# Patient Record
Sex: Male | Born: 1940
Health system: Southern US, Community
[De-identification: ages and names within clinical notes are randomized; demographics above are authoritative.]

## PROBLEM LIST (undated history)

## (undated) DIAGNOSIS — E119 Type 2 diabetes mellitus without complications: Secondary | ICD-10-CM

## (undated) DIAGNOSIS — R569 Unspecified convulsions: Secondary | ICD-10-CM

## (undated) DIAGNOSIS — I483 Typical atrial flutter: Secondary | ICD-10-CM

## (undated) DIAGNOSIS — K449 Diaphragmatic hernia without obstruction or gangrene: Secondary | ICD-10-CM

## (undated) DIAGNOSIS — K859 Acute pancreatitis without necrosis or infection, unspecified: Secondary | ICD-10-CM

## (undated) DIAGNOSIS — I10 Essential (primary) hypertension: Secondary | ICD-10-CM

## (undated) DIAGNOSIS — I639 Cerebral infarction, unspecified: Secondary | ICD-10-CM

## (undated) HISTORY — DX: Diaphragmatic hernia without obstruction or gangrene: K44.9

## (undated) HISTORY — DX: Typical atrial flutter: I48.3

## (undated) HISTORY — DX: Essential (primary) hypertension: I10

## (undated) HISTORY — DX: Acute pancreatitis without necrosis or infection, unspecified: K85.90

---

## 1999-12-08 ENCOUNTER — Emergency Department (HOSPITAL_COMMUNITY): Admission: EM | Admit: 1999-12-08 | Discharge: 1999-12-08 | Payer: Self-pay | Admitting: Emergency Medicine

## 2000-02-15 ENCOUNTER — Encounter: Payer: Self-pay | Admitting: Emergency Medicine

## 2000-02-15 ENCOUNTER — Emergency Department (HOSPITAL_COMMUNITY): Admission: EM | Admit: 2000-02-15 | Discharge: 2000-02-15 | Payer: Self-pay | Admitting: Emergency Medicine

## 2000-08-11 ENCOUNTER — Encounter: Payer: Self-pay | Admitting: Emergency Medicine

## 2000-08-11 ENCOUNTER — Emergency Department (HOSPITAL_COMMUNITY): Admission: EM | Admit: 2000-08-11 | Discharge: 2000-08-11 | Payer: Self-pay | Admitting: Emergency Medicine

## 2000-09-09 ENCOUNTER — Encounter: Admission: RE | Admit: 2000-09-09 | Discharge: 2000-09-09 | Payer: Self-pay | Admitting: Neurology

## 2000-09-09 ENCOUNTER — Encounter: Payer: Self-pay | Admitting: Neurology

## 2004-04-21 ENCOUNTER — Ambulatory Visit: Payer: Self-pay | Admitting: Internal Medicine

## 2004-11-11 ENCOUNTER — Emergency Department (HOSPITAL_COMMUNITY): Admission: EM | Admit: 2004-11-11 | Discharge: 2004-11-11 | Payer: Self-pay | Admitting: Emergency Medicine

## 2004-11-23 ENCOUNTER — Emergency Department (HOSPITAL_COMMUNITY): Admission: EM | Admit: 2004-11-23 | Discharge: 2004-11-23 | Payer: Self-pay | Admitting: Emergency Medicine

## 2004-11-24 ENCOUNTER — Inpatient Hospital Stay (HOSPITAL_COMMUNITY): Admission: EM | Admit: 2004-11-24 | Discharge: 2004-11-30 | Payer: Self-pay | Admitting: Emergency Medicine

## 2004-11-24 ENCOUNTER — Inpatient Hospital Stay (HOSPITAL_COMMUNITY): Admission: RE | Admit: 2004-11-24 | Discharge: 2004-11-24 | Payer: Self-pay | Admitting: Psychiatry

## 2005-02-04 ENCOUNTER — Encounter: Admission: RE | Admit: 2005-02-04 | Discharge: 2005-05-05 | Payer: Self-pay | Admitting: *Deleted

## 2005-02-05 ENCOUNTER — Ambulatory Visit: Payer: Self-pay | Admitting: Internal Medicine

## 2005-02-16 ENCOUNTER — Ambulatory Visit: Payer: Self-pay | Admitting: Internal Medicine

## 2005-03-25 ENCOUNTER — Ambulatory Visit: Payer: Self-pay | Admitting: Internal Medicine

## 2005-05-27 ENCOUNTER — Ambulatory Visit: Payer: Self-pay | Admitting: Internal Medicine

## 2005-06-06 ENCOUNTER — Inpatient Hospital Stay (HOSPITAL_COMMUNITY): Admission: EM | Admit: 2005-06-06 | Discharge: 2005-06-09 | Payer: Self-pay | Admitting: Emergency Medicine

## 2005-06-06 ENCOUNTER — Ambulatory Visit: Payer: Self-pay | Admitting: Internal Medicine

## 2005-06-06 ENCOUNTER — Ambulatory Visit: Payer: Self-pay | Admitting: Physical Medicine & Rehabilitation

## 2005-06-17 ENCOUNTER — Ambulatory Visit: Payer: Self-pay | Admitting: Internal Medicine

## 2005-06-18 ENCOUNTER — Ambulatory Visit: Payer: Self-pay | Admitting: Internal Medicine

## 2005-07-03 ENCOUNTER — Emergency Department (HOSPITAL_COMMUNITY): Admission: EM | Admit: 2005-07-03 | Discharge: 2005-07-03 | Payer: Self-pay | Admitting: Emergency Medicine

## 2005-07-03 ENCOUNTER — Inpatient Hospital Stay (HOSPITAL_COMMUNITY): Admission: EM | Admit: 2005-07-03 | Discharge: 2005-07-08 | Payer: Self-pay | Admitting: Internal Medicine

## 2005-07-08 ENCOUNTER — Ambulatory Visit: Payer: Self-pay | Admitting: Internal Medicine

## 2005-07-15 ENCOUNTER — Ambulatory Visit: Payer: Self-pay | Admitting: Internal Medicine

## 2005-09-14 ENCOUNTER — Ambulatory Visit: Payer: Self-pay | Admitting: Internal Medicine

## 2005-10-07 ENCOUNTER — Ambulatory Visit: Payer: Self-pay | Admitting: Internal Medicine

## 2005-10-08 ENCOUNTER — Encounter: Admission: RE | Admit: 2005-10-08 | Discharge: 2005-10-08 | Payer: Self-pay | Admitting: Internal Medicine

## 2005-10-14 ENCOUNTER — Encounter: Admission: RE | Admit: 2005-10-14 | Discharge: 2005-10-14 | Payer: Self-pay | Admitting: Internal Medicine

## 2005-10-21 ENCOUNTER — Ambulatory Visit: Payer: Self-pay | Admitting: Internal Medicine

## 2005-12-21 ENCOUNTER — Ambulatory Visit: Payer: Self-pay | Admitting: Internal Medicine

## 2006-02-28 ENCOUNTER — Ambulatory Visit: Payer: Self-pay | Admitting: Internal Medicine

## 2006-03-01 LAB — CONVERTED CEMR LAB
ALT: 15 units/L (ref 0–40)
AST: 25 units/L (ref 0–37)
Albumin: 4 g/dL (ref 3.5–5.2)
Amylase: 271 units/L — ABNORMAL HIGH (ref 27–131)
Basophils Absolute: 0.1 10*3/uL (ref 0.0–0.1)
HCT: 37.8 % — ABNORMAL LOW (ref 39.0–52.0)
MCHC: 33.3 g/dL (ref 30.0–36.0)
Neutrophils Relative %: 44.2 % (ref 43.0–77.0)
Platelets: 223 10*3/uL (ref 150–400)
RBC: 4.15 M/uL — ABNORMAL LOW (ref 4.22–5.81)
RDW: 11.6 % (ref 11.5–14.6)
Total Bilirubin: 0.6 mg/dL (ref 0.3–1.2)
WBC: 3.5 10*3/uL — ABNORMAL LOW (ref 4.5–10.5)

## 2006-03-03 ENCOUNTER — Ambulatory Visit: Payer: Self-pay | Admitting: Internal Medicine

## 2006-03-03 LAB — CONVERTED CEMR LAB: Creatinine, Ser: 0.8 mg/dL (ref 0.4–1.5)

## 2006-03-04 ENCOUNTER — Ambulatory Visit: Payer: Self-pay | Admitting: Internal Medicine

## 2006-04-06 ENCOUNTER — Ambulatory Visit: Payer: Self-pay | Admitting: Internal Medicine

## 2006-06-06 ENCOUNTER — Ambulatory Visit: Payer: Self-pay | Admitting: Internal Medicine

## 2006-07-01 ENCOUNTER — Emergency Department (HOSPITAL_COMMUNITY): Admission: EM | Admit: 2006-07-01 | Discharge: 2006-07-01 | Payer: Self-pay | Admitting: Emergency Medicine

## 2006-08-17 ENCOUNTER — Ambulatory Visit: Payer: Self-pay | Admitting: Internal Medicine

## 2006-08-17 ENCOUNTER — Inpatient Hospital Stay (HOSPITAL_COMMUNITY): Admission: EM | Admit: 2006-08-17 | Discharge: 2006-08-21 | Payer: Self-pay | Admitting: Emergency Medicine

## 2006-09-02 ENCOUNTER — Ambulatory Visit: Payer: Self-pay | Admitting: Internal Medicine

## 2006-09-02 LAB — CONVERTED CEMR LAB
ALT: 21 units/L (ref 0–40)
Basophils Relative: 0.3 % (ref 0.0–1.0)
Bilirubin, Direct: 0.1 mg/dL (ref 0.0–0.3)
CO2: 33 meq/L — ABNORMAL HIGH (ref 19–32)
Calcium: 9.6 mg/dL (ref 8.4–10.5)
Eosinophils Absolute: 0.2 10*3/uL (ref 0.0–0.6)
Eosinophils Relative: 4.4 % (ref 0.0–5.0)
GFR calc Af Amer: 124 mL/min
GFR calc non Af Amer: 102 mL/min
Glucose, Bld: 93 mg/dL (ref 70–99)
HCT: 34.6 % — ABNORMAL LOW (ref 39.0–52.0)
Hemoglobin: 11.6 g/dL — ABNORMAL LOW (ref 13.0–17.0)
Lymphocytes Relative: 27.5 % (ref 12.0–46.0)
MCV: 90.1 fL (ref 78.0–100.0)
Monocytes Absolute: 0.4 10*3/uL (ref 0.2–0.7)
Neutro Abs: 3 10*3/uL (ref 1.4–7.7)
Neutrophils Relative %: 58.9 % (ref 43.0–77.0)
Platelets: 409 10*3/uL — ABNORMAL HIGH (ref 150–400)
Potassium: 5.4 meq/L — ABNORMAL HIGH (ref 3.5–5.1)
Sodium: 145 meq/L (ref 135–145)
Total Protein: 6.7 g/dL (ref 6.0–8.3)
WBC: 4.9 10*3/uL (ref 4.5–10.5)

## 2006-09-15 DIAGNOSIS — I10 Essential (primary) hypertension: Secondary | ICD-10-CM | POA: Insufficient documentation

## 2006-09-15 DIAGNOSIS — J309 Allergic rhinitis, unspecified: Secondary | ICD-10-CM | POA: Insufficient documentation

## 2006-09-15 DIAGNOSIS — K7030 Alcoholic cirrhosis of liver without ascites: Secondary | ICD-10-CM | POA: Insufficient documentation

## 2006-09-15 DIAGNOSIS — I639 Cerebral infarction, unspecified: Secondary | ICD-10-CM | POA: Insufficient documentation

## 2006-09-15 DIAGNOSIS — Z8719 Personal history of other diseases of the digestive system: Secondary | ICD-10-CM | POA: Insufficient documentation

## 2006-09-15 DIAGNOSIS — I6350 Cerebral infarction due to unspecified occlusion or stenosis of unspecified cerebral artery: Secondary | ICD-10-CM

## 2006-09-15 DIAGNOSIS — G40909 Epilepsy, unspecified, not intractable, without status epilepticus: Secondary | ICD-10-CM | POA: Insufficient documentation

## 2006-10-11 ENCOUNTER — Ambulatory Visit: Payer: Self-pay | Admitting: Internal Medicine

## 2006-10-11 DIAGNOSIS — F1010 Alcohol abuse, uncomplicated: Secondary | ICD-10-CM | POA: Insufficient documentation

## 2006-10-11 DIAGNOSIS — Z8679 Personal history of other diseases of the circulatory system: Secondary | ICD-10-CM | POA: Insufficient documentation

## 2006-10-11 LAB — CONVERTED CEMR LAB
Basophils Relative: 1 % (ref 0.0–1.0)
Eosinophils Relative: 6.8 % — ABNORMAL HIGH (ref 0.0–5.0)
Hemoglobin: 12 g/dL — ABNORMAL LOW (ref 13.0–17.0)
Lymphocytes Relative: 32.9 % (ref 12.0–46.0)
Monocytes Absolute: 0.4 10*3/uL (ref 0.2–0.7)
Monocytes Relative: 10.7 % (ref 3.0–11.0)
Neutro Abs: 2.1 10*3/uL (ref 1.4–7.7)
Phenytoin Lvl: 11.5 ug/mL (ref 10.0–20.0)
Platelets: 236 10*3/uL (ref 150–400)
RDW: 12.2 % (ref 11.5–14.6)
WBC: 4.1 10*3/uL — ABNORMAL LOW (ref 4.5–10.5)

## 2006-10-12 ENCOUNTER — Telehealth: Payer: Self-pay | Admitting: *Deleted

## 2006-10-13 ENCOUNTER — Inpatient Hospital Stay (HOSPITAL_COMMUNITY): Admission: EM | Admit: 2006-10-13 | Discharge: 2006-10-14 | Payer: Self-pay | Admitting: *Deleted

## 2006-10-14 ENCOUNTER — Ambulatory Visit: Payer: Self-pay | Admitting: Internal Medicine

## 2006-11-04 ENCOUNTER — Encounter: Payer: Self-pay | Admitting: Internal Medicine

## 2006-11-14 ENCOUNTER — Encounter (INDEPENDENT_AMBULATORY_CARE_PROVIDER_SITE_OTHER): Payer: Self-pay

## 2006-12-10 ENCOUNTER — Inpatient Hospital Stay (HOSPITAL_COMMUNITY): Admission: EM | Admit: 2006-12-10 | Discharge: 2006-12-12 | Payer: Self-pay | Admitting: Emergency Medicine

## 2006-12-10 ENCOUNTER — Ambulatory Visit: Payer: Self-pay | Admitting: Internal Medicine

## 2006-12-14 ENCOUNTER — Ambulatory Visit: Payer: Self-pay | Admitting: Internal Medicine

## 2006-12-14 DIAGNOSIS — J328 Other chronic sinusitis: Secondary | ICD-10-CM | POA: Insufficient documentation

## 2007-01-18 ENCOUNTER — Ambulatory Visit: Payer: Self-pay | Admitting: Internal Medicine

## 2007-04-21 ENCOUNTER — Ambulatory Visit: Payer: Self-pay | Admitting: Internal Medicine

## 2007-04-21 DIAGNOSIS — N529 Male erectile dysfunction, unspecified: Secondary | ICD-10-CM | POA: Insufficient documentation

## 2007-06-16 ENCOUNTER — Inpatient Hospital Stay (HOSPITAL_COMMUNITY): Admission: EM | Admit: 2007-06-16 | Discharge: 2007-06-18 | Payer: Self-pay | Admitting: Emergency Medicine

## 2007-06-19 ENCOUNTER — Telehealth: Payer: Self-pay | Admitting: Internal Medicine

## 2007-07-20 ENCOUNTER — Ambulatory Visit: Payer: Self-pay | Admitting: Internal Medicine

## 2007-09-13 ENCOUNTER — Telehealth: Payer: Self-pay | Admitting: *Deleted

## 2007-09-26 ENCOUNTER — Encounter: Admission: RE | Admit: 2007-09-26 | Discharge: 2007-09-26 | Payer: Self-pay | Admitting: Neurology

## 2007-09-26 ENCOUNTER — Encounter: Payer: Self-pay | Admitting: Internal Medicine

## 2007-09-27 ENCOUNTER — Encounter: Admission: RE | Admit: 2007-09-27 | Discharge: 2007-09-27 | Payer: Self-pay | Admitting: Neurology

## 2007-10-12 ENCOUNTER — Inpatient Hospital Stay (HOSPITAL_COMMUNITY): Admission: EM | Admit: 2007-10-12 | Discharge: 2007-10-13 | Payer: Self-pay | Admitting: Emergency Medicine

## 2007-10-12 ENCOUNTER — Ambulatory Visit: Payer: Self-pay | Admitting: Internal Medicine

## 2007-11-17 ENCOUNTER — Ambulatory Visit: Payer: Self-pay | Admitting: Internal Medicine

## 2009-09-09 ENCOUNTER — Encounter: Admission: RE | Admit: 2009-09-09 | Discharge: 2009-09-09 | Payer: Self-pay | Admitting: Urology

## 2009-09-16 ENCOUNTER — Encounter: Admission: RE | Admit: 2009-09-16 | Discharge: 2009-09-16 | Payer: Self-pay | Admitting: Geriatric Medicine

## 2010-04-12 ENCOUNTER — Encounter: Payer: Self-pay | Admitting: Geriatric Medicine

## 2010-04-21 NOTE — Assessment & Plan Note (Signed)
Summary: M4A/CCM   Vital Signs:  Patient Profile:   70 Years Old Male Height:     72 inches Weight:      180 pounds Temp:     98.2 degrees F oral Pulse rate:   80 / minute Resp:     14 per minute BP sitting:   120 / 70  (left arm)  Vitals Entered By: Willy Eddy, LPN (July 20, 2007 9:51 AM)                 Chief Complaint:  post hospital---was in pt for 2 days with dx of seizures--pt states that he forgot to take morning dosage of keppra .  History of Present Illness: Missed a keppra dose and had a sz later in the day Pt has no significant sequelae Alert and oriented Continued remisson for alcoholism Pancreatis... no current sumptoms Complains of runny nose anD PRD     Current Allergies: No known allergies   Past Medical History:    Reviewed history from 01/18/2007 and no changes required:       Allergic rhinitis       Hypertension       Cerebrovascular accident, hx of       Pancreatitis, hx of       Seizure disorder       abnormal liver study 794.8       etoh  abuse hx 305.1   Family History:    Reviewed history from 10/11/2006 and no changes required:       pt cannot recall family hx  Social History:    Reviewed history from 10/11/2006 and no changes required:       Retired       Single       Former Smoker    Review of Systems  The patient denies anorexia, fever, weight loss, weight gain, vision loss, decreased hearing, hoarseness, chest pain, syncope, dyspnea on exhertion, peripheral edema, prolonged cough, hemoptysis, abdominal pain, melena, hematochezia, severe indigestion/heartburn, hematuria, incontinence, genital sores, muscle weakness, suspicious skin lesions, transient blindness, difficulty walking, depression, unusual weight change, abnormal bleeding, enlarged lymph nodes, and angioedema.     Physical Exam  General:     Well-developed,well-nourished,in no acute distress; alert,appropriate and cooperative throughout  examination Head:     atraumatic and no abnormalities observed.   Eyes:     vision grossly intact, pupils equal, and pupils round.   Ears:     External ear exam shows no significant lesions or deformities.  Otoscopic examination reveals clear canals, tympanic membranes are intact bilaterally without bulging, retraction, inflammation or discharge. Hearing is grossly normal bilaterally. Nose:     no external deformity.  nasal dischargemucosal pallor and mucosal erythema.   Mouth:     pharyngeal exudate, posterior lymphoid hypertrophy, and postnasal drip.   Neck:     No deformities, masses, or tenderness noted. Lungs:     Normal respiratory effort, chest expands symmetrically. Lungs are clear to auscultation, no crackles or wheezes. Heart:     Normal rate and regular rhythm. S1 and S2 normal without gallop, murmur, click, rub or other extra sounds. Abdomen:     soft, normal bowel sounds, no distention, and no masses.      Impression & Recommendations:  Problem # 1:  SINUSITIS, CHRONIC NEC (ICD-473.8) due to allergies The following medications were removed from the medication list:    Chlor-mes 12-20-2.5 Mg Tb12 (Chlorphen-phenyleph-methscop)    Veramyst 27.5 Mcg/spray Susp (Fluticasone furoate) .Marland KitchenMarland KitchenMarland KitchenMarland Kitchen  Spray 2 spray into both nostrils every morning  His updated medication list for this problem includes:    Veramyst 27.5 Mcg/spray Susp (Fluticasone furoate) .Marland Kitchen..Marland Kitchen Two sprays q nare daily as directed    Allegra-d 12 Hour 60-120 Mg Tb12 (Fexofenadine-pseudoephedrine) ..... One by mouth two times a day as neede for runny nose Take antibiotics for full duration. Discussed treatment options including indications for coronal CT scan of sinuses and ENT referral.   Problem # 2:  SEIZURE DISORDER (ICD-780.39) blood levels checked in the ER and hospital The following medications were removed from the medication list:    Dilantin 100 Mg Caps (Phenytoin sodium extended) .Marland KitchenMarland KitchenMarland KitchenMarland Kitchen 3 capsule by mouth  every morning    Keppra 500 Mg Tabs (Levetiracetam) .Marland Kitchen... Take 1 tablet by mouth twice a day  His updated medication list for this problem includes:    Keppra 500 Mg Tabs (Levetiracetam) .Marland Kitchen... 2 1/2 two times a day    Dilantin 100 Mg Caps (Phenytoin sodium extended) .Marland KitchenMarland KitchenMarland KitchenMarland Kitchen 4 at hs  Labs Reviewed: Hgb: 12.0 (10/11/2006)   Hct: 35.6 (10/11/2006)    SGOT: 22 (09/02/2006)   SGPT: 21 (09/02/2006)   Amylase: 271 (03/01/2006) Dilantin: 11.5 (10/11/2006)      Complete Medication List: 1)  Keppra 500 Mg Tabs (Levetiracetam) .... 2 1/2 two times a day 2)  Aggrenox 25-200 Mg Cp12 (Aspirin-dipyridamole) .... One twice a day 3)  Dilantin 100 Mg Caps (Phenytoin sodium extended) .... 4 at hs 4)  Multivital-m Tabs (Multiple vitamins-minerals) .... Once daily 5)  Lipram 4500 20-4.5-25 Mu Cpep (Amylase-lipase-protease) .... Two three times a day 6)  Veramyst 27.5 Mcg/spray Susp (Fluticasone furoate) .... Two sprays q nare daily as directed 7)  Allegra-d 12 Hour 60-120 Mg Tb12 (Fexofenadine-pseudoephedrine) .... One by mouth two times a day as neede for runny nose   Patient Instructions: 1)  Please schedule a follow-up appointment in 4 months.    Prescriptions: KEPPRA 500 MG  TABS (LEVETIRACETAM) 2 1/2 two times a day  #150 x 6   Entered and Authorized by:   Stacie Glaze MD   Signed by:   Stacie Glaze MD on 07/20/2007   Method used:   Electronically sent to ...       [facility] [id number]       [address]       [place], [place]  [address]       Ph: [phone] or [phone]       Fax: [phone]   RxID:   [id number] ALLEGRA-D 12 HOUR 60-120 MG  TB12 (FEXOFENADINE-PSEUDOEPHEDRINE) one by mouth two times a day as neede for runny nose  #60 x 11   Entered and Authorized by:   Stacie Glaze MD   Signed by:   Stacie Glaze MD on 07/20/2007   Method used:   Electronically sent to ...       [facility]  [address] [id number]       [address]        [place], [place]  [address]       Ph: [phone] or [phone]       Fax: [phone]   RxID:   [id number]  ]

## 2010-06-25 ENCOUNTER — Encounter: Payer: Self-pay | Admitting: Internal Medicine

## 2010-08-04 NOTE — H&P (Signed)
NAME:  [name], [name] NO.:  [id number]   MEDICAL RECORD NO.:  [id number]          PATIENT TYPE:  EMS   LOCATION:  MAJO                         FACILITY:  [facility]   PHYSICIAN:  Michiel Cowboy, MDDATE OF BIRTH:  05-30-1940   DATE OF ADMISSION:  06/16/2007  DATE OF DISCHARGE:                              HISTORY & PHYSICAL   PRIMARY CARE PHYSICIAN:  Dr. Lovell Sheehan.   CHIEF COMPLAINT:  Possible seizure activity.   HISTORY OF PRESENT ILLNESS:  The patient is a 70 year old gentleman with  past medical history significant for CVA with aphasia and history of  seizure disorder, was found down at home in the bedroom, incontinent of  feces.  Daughter talked to him earlier in the day, he was okay, but  tried to get a hold of him since 9:00 p.m. with no success.  Last  seizure episode in September for which the patient was hospitalized  here.   REVIEW OF SYSTEMS:  Unable to obtain secondary to the patient not being  able to answer any questions.   PAST MEDICAL HISTORY:  1. Seizure disorder.  2. History of hemorrhagic CVA of the right hemiparesis and progressive      aphasia.  3. History of pancreatitis secondary to alcohol.  4. History of alcohol abuser and DT's.  5. Allergies.  6. Questionable history of meningitis.  7. Hypertension.  8. History of aspiration pneumonia.  9. Dementia.  10.History of possible medication noncompliance.  11.Hypertension.  12.Hypercholesterolemia.   SOCIAL HISTORY:  The patient lives at home, alone, denies alcohol, at  least per review of records.  No alcohol.  Retired Paramedic.   FAMILY HISTORY:  Noncontributory.   MEDICATIONS:  Unable to obtain from the patient.  He has no family at  bedside.  Per ER records, on:  1. Aggrenox one tablet twice a day.  2. Dilantin 300 mg at bedtime.  3. Keppra 500 mg b.i.d.  4. Pancrease one tablet once a day.  5. Cormax one tablet p.o. b.i.d.   ALLERGIES:  NO KNOWN DRUG ALLERGIES.   PHYSICAL EXAMINATION:  VITAL SIGNS:  Temperature 101.5, blood pressure  153/79, pulse 99, respirations 18, saturating 94% on room air, currently  on 2 liters at 100%.  GENERAL:  Patient in no acute distress, laying on the stretcher.  HEENT:  Head nontraumatic.  Mucous membranes moist.  Pupils and equal  and reactive.  The patient is somewhat somnolent but easily arousable.  LUNGS:  Clear to auscultation bilaterally.  ABDOMEN:  Somewhat distended.  NEUROLOGICAL:  Unable to evaluate given that the patient is not  cooperating.  EXTREMITIES:  Lower extremities without edema.   LABORATORY DATA:  White blood cell count 11.6, hemoglobin 13.2.  Sodium  138, potassium 4.5, BUN 11, creatinine 0.7, glucose 87, alk-phos 44, AST  41, ALT 22.  Phenytoin level 11.1.  UA negative.  Chest x-ray no acute  changes.  CT of the head, no acute changes.  EKG not obtained.   ASSESSMENT/PLAN:  1. This is a 70 year old gentleman with history of seizure disorder.  Admitted due to likely seizure episode, postictal.  Will admit to      telemetry bed and monitor.  We will put on seizure precautions plus      full precautions.  Obtain EEG.  Obtain Keppra level.  Would      recommend having Neurology follow up in the morning to see if his      medication needs to be adjusted.  Also, will discuss in the morning      with the family to see if the patient is actually complying with      his medications or not.  2. History of cerebrovascular accident.  Continue Aggrenox.  3. Somewhat of a distended abdomen.  Obtain KUB.  4. Low-grade fever.  Will obtain blood cultures.  Chest x-ray showed      no pneumonia, but will repeat in the morning.  5. Patient to have PT/OT consult.  Likely social work consult because      the patient will likely need to be at least transiently placed.  6. Prophylaxis Protonix and SCDs.      Michiel Cowboy, MD  Electronically Signed     AVD/MEDQ  D:  06/16/2007  T:  06/16/2007   Job:  [id number]

## 2010-08-04 NOTE — Discharge Summary (Signed)
Brent, Dominguez               ACCOUNT NO.:  [id number]   MEDICAL RECORD NO.:  [id number]          PATIENT TYPE:  INP   LOCATION:  6703                         FACILITY:  [facility]   PHYSICIAN:  Raenette Rover. Felicity Coyer, MDDATE OF BIRTH:  [birth year]   DATE OF ADMISSION:  12/09/2006  DATE OF DISCHARGE:  12/12/2006                               DISCHARGE SUMMARY   DISCHARGE DIAGNOSES:  1. Presumed recurrent seizure activity, unwitnessed.  2. Low-grade temperature resolved with normal white count and no clear      signs of active infection.  3. History of CVA with right-sided hemiparesis and expressive aphasia.   HISTORY OF PRESENT ILLNESS:  [name] is a 70 year old male who was  admitted on December 09, 2006 with recurrent seizure activity.  He was  apparently found down at home.  However, there was no one available at  time of this episode to witness this event.  Apparently, a relative  called his home for about two hours without response and then went to  his home and found the patient down.  EMS was called.  He was noted be  confused at time of admission and was admitted for presumed recurrent  seizure.   PAST MEDICAL HISTORY:  1. Seizure disorder.  2. History of hemorrhagic CVA with right-sided hemiparesis and      expressive aphasia.  3. History of pancreatitis secondary to alcohol.  4. History of alcohol with remote abuse and DTs.  5. Allergies.  6. Questionable history of meningitis.  7. Hypertension history, though not currently on any      antihypertensives.  8. History of aspiration pneumonia.  9. History of dementia previously on Aricept  10.History of medication noncompliance.  11.Hypertension.  12.Hypercholesterolemia.   COURSE OF HOSPITALIZATION:  The patient was admitted and was placed on  seizure precautions.  There were no further seizures noted during this  admission.  The patient was continued on Keppra.  Dilantin levels were  noted to be therapeutic.  CT of  the head performed on admission showed  no intracranial abnormality.  It did note chronic sinusitis.  MRI of the  brain performed on September 20 noted no acute intracranial abnormality.  It did note a stable left superior temporal gyrus and insular  encephalomalacia and chronic pan sinus disease.  The patient was seen by  physical therapy and was felt that the patient had no skilled needs.  Chest x-ray was performed during this admission which noted COPD and  chronic bronchitis but no acute changes.   The patient was noted to have a low-grade temperature of 100 degrees x1  during this admission.  He has no active signs of infection, and he has  had no further fever and has not required antibiotics.  His white blood  cell count is normal.  He is instructed to return to the ER should he  develop fever greater than 101.   MEDICATIONS:  At time of discharge:  1. Keppra 500 mg twice daily.  2. Dilantin 100 mg twice daily.  3. Pancrease 1 capsule p.o. t.i.d. a.c. meals.  4.  Aggrenox 1 capsule p.o. b.i.d.   PERTINENT LABORATORY DATA:  At time of discharge:  BUN 7, creatinine  0.71, hemoglobin 11.9, hematocrit 35.8.   DISPOSITION AND PLAN:  Discharge the patient to home.   FOLLOW UP:  The patient is scheduled to follow up with Dr. Darryll Capers  on October 6 at 10:15 a.m.   SPECIAL INSTRUCTIONS:  The patient instructed not to drive for least 6  months.  Further instructions per Dr. Lovell Sheehan.      Sandford Craze, NP      Raenette Rover. Felicity Coyer, MD  Electronically Signed    MO/MEDQ  D:  12/12/2006  T:  12/13/2006  Job:  [id number]   cc:   Stacie Glaze, MD

## 2010-08-04 NOTE — H&P (Signed)
NAMEEYAD, ROCHFORD NO.:  [id number]   MEDICAL RECORD NO.:  [id number]          PATIENT TYPE:  INP   LOCATION:  6703                         FACILITY:  [facility]   PHYSICIAN:  Corwin Levins, MD      DATE OF BIRTH:  01/03/1941   DATE OF ADMISSION:  12/09/2006  DATE OF DISCHARGE:                              HISTORY & PHYSICAL   TIME SEEN:  3:20 a.m.   CHIEF COMPLAINT:  Recurrent seizure activity, apparently found down at  home.  The family is not available with history per previous computer  records and EDP.   HISTORY OF PRESENT ILLNESS:  Mr. Risden is a 70 year old African American  male with a history of recurrent seizure and multiple medical problems.  He was brought to the ER after a relative apparently called his home for  about 2 hours earlier this evening with no response.  The relative went  to the home, the patient was found down, and EMS called.  She presumed  the patient was down for these 2 hours.  He is confused acutely on  chronically at scene and seizure activity was noted.  He was transported  to the ER.  He has chronic expressive aphagia, unable to express  thoughts, but is able to deny pain.   PAST MEDICAL HISTORY/ILLNESSES:  1. Seizure disorder.  2. History of hemorrhagic CVA with right hemiparesis and expressive      aphagia.  3. History of pancreatitis secondary to alcohol.  4. History of alcohol, remote abuse with DTs.  5. Allergies.  6. Questionable history of meningitis.  7. Hypertension history, but apparently no treatment.  8. History of aspirate pneumonia.  9. Dementia according to previous neurological evaluation, plus      previously on Aricept.  10.History of possible medication noncompliance.  11.Hypertension.  12.Hypercholesterolemia.   ALLERGIES:  NO KNOWN DRUG ALLERGIES.   CURRENT MEDICATIONS:  As per discharge summary October 14, 2006:  1. Keppra 500 mg q.a.m. and 750 q.p.m.  2. Dilantin 100 mg q.a.m. and 300 mg q.p.m.  3. Aggrenox 1 p.o. b.i.d.  4. Pancrease 1 p.o. t.i.d. a.c.   SOCIAL HISTORY:  Lives alone.  No tobacco.  No alcohol for many years.  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   REVIEW OF SYSTEMS:  Otherwise noncontributory.   PHYSICAL EXAMINATION:  VITAL SIGNS:  Temperature 99.5, blood pressure  139/89, respirations 20, pulse 84, O2 saturation 97%.  HEENT:  Sclerae clear.  Tympanic membranes clear.  Oropharynx benign.  NECK:  Without lymphadenopathy, JVD, thyromegaly.  CHEST:  No rales or wheezes.  CARDIAC:  Regular rate and rhythm.  No murmur.  ABDOMEN:  Soft, nontender, positive bowel sounds.  No organomegaly or  masses.  EXTREMITIES:  No edema.  There is apparently no injury associated.   LABORATORY DATA:  A CT of the head, no acute disease.  Dilantin level  12.7.  White blood cell count 7.5, hemoglobin 12.6, CPK 180,  electrolytes within normal limits.  BUN 11, creatinine 0.9, glucose 96.  Urinalysis  negative.   ASSESSMENT/PLAN:  1. Seizure activity, unclear etiology of this seizure, except for      possible breakthrough seizure on current medications.  He has a      history of possible noncompliance in the past.  The patient is      unable to go home and __________ cannot drive.  The family not able      to be contacted.  He needs to be admitted to further monitor on      previous medications as per multiple neurological consultations and      assess for further appropriately to return home versus placement.      We will check followup head MRI.  2. Mild elevated temperature, check portable chest x-ray.  He has      little to no other symptoms of pneumonia or other problems.  3. Other medical problems as per past medical history, continue on      medications.  4. Prophylaxis add PPI therapy and Lovenox subcu.      Corwin Levins, MD  Electronically Signed     JWJ/MEDQ  D:  12/10/2006  T:  12/11/2006  Job:  [id number]   cc:    Stacie Glaze, MD

## 2010-08-04 NOTE — Procedures (Signed)
EEG NUMBER:  [phone]   CLINICAL HISTORY:  The patient is a 70 year old with a history of  seizure disorder, cerebrovascular accident, TIA, and pancreatitis.  The  patient was admitted following a seizure at home.  He bitten his tongue.  He was nonverbal and in a postictal state.  Study is being done to look  for the presence of seizures (345.10, 345.40)   PROCEDURE:  The tracing is carried out on a 32-channel digital Cadwell  recorder reformatted into 16 channel montages with one devoted to EKG.  The patient was awake but postictal.  The international 10/20 system  lead placement was used.  Medications include Ativan, Dilantin,  Aggrenox, Keppra, and Jamgtura?   DESCRIPTION OF FINDINGS:  Dominant frequency is a 25 microvolt, 4-5 Hz  rhythmic lower theta and upper delta range activity that is seen  throughout the record.  Polymorphic 1-2 Hz delta range activity is  superimposed in the same regions.   There appears to be somewhat greater delta range activity over the left  hemisphere and greater theta range activity over the right.  There was  no interictal epileptiform activity in the form of spikes or sharp  waves.  Activating procedures with photic stimulation failed to induce a  driving response.  Hyperventilation was not carried out.   EKG showed a regular sinus rhythm with ventricular response of 96 beats  per minute.   IMPRESSION:  Abnormal EEG on the basis of diffuse background slowing  that is more prominent over the left than the right.  This can be seen  in a variety of conditions including structural and/or vascular  abnormalities, postictal states.  Findings require careful clinical  correlation.  No seizure activity was seen in this record.      Deanna Artis. Sharene Skeans, [name]  Electronically Signed     [id number]  D:  10/12/2007 23:33:10  T:  10/13/2007 10:22:27  Job #:  [id number]   cc:   Vikki Ports A. Felicity Coyer, MD  [address] [place], [place] [address]

## 2010-08-04 NOTE — H&P (Signed)
NAMELAMERE, [name] NO.:  [id number]   MEDICAL RECORD NO.:  [id number]          PATIENT TYPE:  INP   LOCATION:  2108                         FACILITY:  [facility]   PHYSICIAN:  Lowella Bandy, MD      DATE OF BIRTH:  07/25/1940   DATE OF ADMISSION:  08/16/2006  DATE OF DISCHARGE:                              HISTORY & PHYSICAL   PRIMARY CARE PHYSICIAN:  Cristi Loron, [name]   CHIEF COMPLAINT:  Altered mental status.   HISTORY OF PRESENT ILLNESS:  [name] is a 70 year old African-American  male with a history of hemorrhagic CVA and seizure disorder, who began  having some type of aphasia earlier this evening.  He was able to  communicate with his neighbor to call his sister.  Soon after his sister  arrived, [name] became very agitated.  No seizure activity was  witnessed.  Of note, he had not complained earlier of any headache,  fever, or other symptoms overtly to family or friends.  Upon arrival in  the emergency department, he was extremely agitated and required  extensive IV Ativan to sedate the patient for self-protection.  Unfortunately, no further history is available at this time.   PAST MEDICAL HISTORY:  1. History of hemorrhagic CVA in October, 2006, left peri-insular.  2. Seizure disorder.  3. Reported history of pancreatitis.  4. History of remote alcohol abuse.   MEDICATIONS:  1. Aggrenox 1 tablet b.i.d.  2. Dilantin 300 mg p.o. at bedtime.  3. Keppra 500 mg p.o. b.i.d.  4. Lipram pancreatic enzyme 2 tablets t.i.d.   ALLERGIES:  No known drug allergies.   SOCIAL HISTORY:  The patient formerly had a remote history of alcohol  abuse.  He has not had any alcohol in many years.  He is not a smoker.   FAMILY HISTORY:  Negative for heart disease.   REVIEW OF SYSTEMS:  Unobtainable due to the patient's mental status.   PHYSICAL EXAMINATION:  VITAL SIGNS:  Temperature 101.5, blood pressure  177/90, pulse 109 (sinus tachycardia).  GENERAL:   Patient is sedated after IV Ativan, breathing comfortably in  no apparent distress.  HEENT:  Normocephalic and atraumatic.  Oropharynx clear.  No blood in  the oropharynx.  No evidence of tongue-biting.  NECK:  No cervical lymphadenopathy, no carotid bruits or jugular venous  distention.  Unable to assess meningismus due to the patient's sedation.  CARDIOVASCULAR:  Tachycardic, hyperdynamic, regular rate.  No murmurs,  rubs or gallops.  CHEST:  Clear to auscultation bilaterally anteriorly.  ABDOMEN: Soft, nondistended.  Positive bowel sounds.  EXTREMITIES:  Warm.  No edema.  NEUROLOGIC:  Patient is sedated after IV Ativan.  Arouses to voice but  does not respond to questions.  Previously seen to be moving all  extremities.  SKIN:  No rashes.  MUSCULOSKELETAL:  No joint effusions or erythema.   CT of the head, no acute pathology seen.   Chest x-ray, which I personally reviewed, shows cardiomegaly with  possible bronchitic changes.  No focal infiltrate.   EKG, which I personally reviewed, shows sinus  tachycardia, otherwise  normal.   LABORATORY VALUES:  White blood cell count 15.8, hemoglobin 13.5,  hematocrit 42, platelets 271, 90% neutrophils.  Sodium 134, potassium  4.3, chloride 96, bicarb 25, BUN 10, creatinine 0.8, glucose 139,  alkaline phosphatase 132, AST 34, ALT 16, lipase 23.  Blood alcohol  level undetectable.  Dilantin level 14.2 (therapeutic).  Urinalysis  unremarkable.   ASSESSMENT/PLAN:  A 70 year old male with a history of prior stroke and  seizures, who presents with aphasia, altered mental status, agitation,  and fever.  The clinical setting is certainly concerning for possible  meningitis.  Exacerbation of his underlying seizure disorder as well as  a new stroke are also possibilities.  1. We will admit to step-down unit to Dr. Delma Officer.  2. Possible meningitis:  The patient is being given empiric      antibiotics for possible meningitis with IV  vancomycin, Rocephin,      and ampicillin.  Unfortunately, due to the patient's agitation, a      lumbar puncture cannot safely be performed at this time.  Blood      cultures were drawn prior to the initiation of antibiotics.  He      likely will need a lumbar puncture later today if his agitation has      resolved or there is adequate staff to perform an LP under      sedation.  3. Possible seizure or cerebrovascular accident:  The emergency      department consulted Dr. Vickey Huger from neurology, who recommended      repeating an IV bolus of Keppra.  No Dilantin load was needed      because the patient's level was therapeutic.  Dr. Vickey Huger will see      the patient later this morning.  We will continue his home dose of      Dilantin and Keppra, with prn Ativan. A stroke is also still      possible.  We will continue his Aggrenox therapy.  4. Prophylaxis:  We will use subcutaneous heparin for deep venous      thrombosis prophylaxis.  5. Patient will be maintained on seizure precautions and held n.p.o.      for the time being until his symptoms improve.      Lowella Bandy, MD  Electronically Signed     JJC/MEDQ  D:  08/17/2006  T:  08/17/2006  Job:  [id number]

## 2010-08-04 NOTE — Procedures (Signed)
EEG NUMBER:   CLINICAL HISTORY:  A 70 year old male being evaluated for possible  seizure activity.   MEDICATIONS:  Dilantin, Aggrenox, Keppra, and Librium.   This 17-channel portable EEG recorded with the patient being awake and  combative.  Standard 10/20 electrode placement was obtained on a 17-  channel machine.   Background pattern awake rhythm consists of 10-11 Hz alpha which is of  low amplitude, synchronous, and admixed with high frequency low  amplitude beta activity which is seen throughout the tracing and may be  a medication effect.  No definite paroxysmal epileptiform activity  spikes or sharp waves are seen.  Sleep stages are not seen in this  recording.  Length of this EEG is 21.5 minutes.  Technical component is  average.  EKG tracing reveals regular sinus rhythm.  Hyperventilation  not performed.  Photic stimulation was also not performed.   IMPRESSION:  This EEG performed during awake state is within normal  limits.  No definite epileptiform activity is identified.           ______________________________  Sunny Schlein. Pearlean Brownie, MD     ZOX:WRUE  D:  08/18/2006 15:04:33  T:  08/18/2006 17:59:04  Job #:  [id number]   cc:   Melvyn Novas, [name]  Fax: [phone]

## 2010-08-04 NOTE — H&P (Signed)
NAME:  FRIEND, DORFMAN NO.:  [id number]   MEDICAL RECORD NO.:  [id number]          PATIENT TYPE:  INP   LOCATION:  4706                         FACILITY:  [facility]   PHYSICIAN:  Ramiro Harvest, MD    DATE OF BIRTH:  1940-06-05   DATE OF ADMISSION:  10/11/2007  DATE OF DISCHARGE:                              HISTORY & PHYSICAL   ATTENDING PHYSICIAN:  Ramiro Harvest, MD   PRIMARY CARE PHYSICIAN:  Stacie Glaze, MD   HISTORY OF PRESENT ILLNESS:  [name] is a 70 year old African  American male with a history of left CVA with resultant right  hemiparesis, aphasia, seizure disorder who presented to the ED with  recurrent seizures.  The patient at the  time of interview is postictal,  but getting a little more alert and following some commands but aphasic.  The patient does not recollect events and no family at bedside to give  the history and as such H&P obtained per ED records.  Per ED records,  the patient presented to the ED in postictal state and was unresponsive.  Per records, the patient's family member reportedly stated that the  patient's Dilantin had been discontinued, however, the patient was still  on the Keppra.  The patient had a witnessed seizure per report, which  was followed by confusion and no other associated symptoms.  Per ED  report, the patient remained in a prolonged postictal state in the ED  and we were called to admit for further evaluation and recommendations.  Head CT done was negative.  BMET was within normal limits.  Dilantin  level was decreased at 4.8.  EKG with normal sinus rhythm and unchanged  from prior EKG.  CBC with a white count of 12.1, hemoglobin 12.2,  platelets 244, hematocrit 37.2, ANC of 10.1.  The patient was given  Ativan IV in the ED and Dilantin 500 mg IV push and placed on seizure  precautions.   ALLERGIES:  No known drug allergies.   PAST MEDICAL HISTORY:  1. History of seizures.  2. History of hemorrhagic  CVA with right hemiparesis and progressive      aphasia in 2006.  3. History of pancreatitis secondary to alcohol use.  4. History of alcohol abuse and DTs.  5. Allergies.  6. Questionable history of meningitis.  7. Hypertension.  8. History of aspiration pneumonia.  9. Dementia, he was previously on Aricept.  10.History of medical noncompliance.  11.Hyperlipidemia.   HOME MEDICATIONS:  The patient unable to give the information and this  was obtained per ED records.  1. Aggrenox 1 tablet p.o. b.i.d.  2. Keppra 500 mg b.i.d.  3. Pancrease 1 tablet daily.  4. Dilantin 300 mg p.o. b.i.d.  5. Flomax tablet b.i.d.   SOCIAL HISTORY:  The patient is a retired Paramedic, lives at home.  No alcohol use.  No IV drug use.  No tobacco use.  This was obtained per  E-chart.   FAMILY HISTORY:  Noncontributory.   REVIEW OF SYSTEMS:  Unable to obtain secondary to the patient's  postictal state.  PHYSICAL EXAMINATION:  VITALS SIGNS:  Temperature 97.7, blood pressure  113/68, pulse of 93, respiratory rate 18, and sating 100% on room air.  GENERAL:  The patient is sleeping, postictal, but getting more alert and  following some commands.  HEENT:  Normocephalic, atraumatic.  Pupils equal, round and reactive to  light.  Extraocular movements intact.  Oropharynx is clear.  No lesions,  no exudates.  NECK:  Supple.  No lymphadenopathy.  RESPIRATORY:  Lungs are clear to auscultation bilaterally.  No wheezes,  no rhonchi.  CARDIOVASCULAR:  Regular rate and rhythm.  No murmurs, rubs, or gallops.  ABDOMEN:  Soft, nontender, nondistended.  Positive bowel sounds.  EXTREMITIES:  No clubbing, cyanosis, or edema.  NEUROLOGIC:  The patient is drowsy postictal, and also aphasic.  Cranial  nerves II through XII grossly intact.  Moves all extremities  spontaneously.  Unable to obtain full neuro exam secondary to aphasia  and postictal state.   LABORATORY DATA:  Dilantin 4.8, sodium 138, potassium 3.8,  chloride 101,  bicarb 30, BUN 7, creatinine 0.81, glucose of 8.5, and calcium of 9.1.  CBC, white count 12.1, hemoglobin 12.2, platelets 244, hematocrit 37.2,  and ANC of 10.1.   CT of the head showed no acute intracranial abnormality, chronic  bilateral ethmoid and maxillary sinusitis.  EKG was normal sinus rhythm,  incomplete right bundle-branch block unchanged from prior EKG.   ASSESSMENT AND PLAN:  [name] is a 70 year old gentleman with,  1. History of cerebrovascular accident x2, history of recurrent      seizures who presents to the hospital again with a recurrent      seizure. We will admit to telemetry, make n.p.o., seizure and fall      precautions, check Keppra level, cardiac enzymes, check a magnesium      level, check a TSH, check alcohol level, check a UDS, check a CMET,      coags as well, and a chest x-ray.  PT/OT will also order EEG.  We      will continue home dose Keppra 500 b.i.d. and Dilantin 300 mg      b.i.d., Ativan as needed.  Neurology consult in the morning.  If      aphasic and not improving, will likely need a MRI.  Ativan as      needed.  2. History of pancreatitis, on Pancrease.  3. History of alcohol abuse.  The patient with no current alcohol use,      Ativan p.r.n. as needed.  4. History of cerebrovascular accident.  Continue home dose Aggrenox 1      tablet b.i.d.  5. Hypertension.  6. Dementia.  7. Hyperlipidemia.  8. Prophylaxis, Protonix for GI prophylaxis.  SCDs for DVT      prophylaxis.     Ramiro Harvest, MD  Electronically Signed    DT/MEDQ  D:  10/12/2007  T:  10/12/2007  Job:  [id number]   cc:   Stacie Glaze, MD

## 2010-08-04 NOTE — Discharge Summary (Signed)
[name], [name] NO.:  [id number]   MEDICAL RECORD NO.:  [id number]          PATIENT TYPE:  INP   LOCATION:  3705                         FACILITY:  [facility]   PHYSICIAN:  Rosalyn Gess. Norins, MD  DATE OF BIRTH:  November 15, 1940   DATE OF ADMISSION:  06/15/2007  DATE OF DISCHARGE:  06/18/2007                               DISCHARGE SUMMARY   ADMITTING DIAGNOSES:  1. Breakthrough seizure, and he was thought to be postictal.  2. History of cerebrovascular accident.  3. Distended abdomen.  4. Low-grade fever.   DISCHARGE DIAGNOSES:  1. Breakthrough seizure, and he was thought to be postictal.  2. History of cerebrovascular accident.  3. Distended abdomen.  4. Low-grade fever.   CONSULTANTS:  Gustavus Messing. Orlin Hilding, [name] for neurology.   PROCEDURES:  1. EEG with interpretation performed on June 16, 2007, read out as an      abnormal study in the sleep stage due to the presence of      intermittent left frontotemporal spike with sharp wave activity      suggesting underlying focal pathology with potential      epileptogenicity.  No persistent seizure activity was seen on the      examination.  2. A CT scan of the brain performed on June 16, 2007, showed negative      for bleed or other acute intracranial process.  Ethmoid and right      maxillary sinus disease was noted.  3. A chest x-ray on June 16, 2007 showing chronic pulmonary      parenchymal changes without acute change.  4. Abdominal x-ray performed on June 16, 2007, which showed no acute      abnormalities, no evidence of bowel obstruction.  5. Chest x-ray read out as showing a stable chest with no active      disease.   HISTORY OF PRESENT ILLNESS:  The patient is a 70 year old African  American gentleman with a history of CVA with aphasia and history of  seizure disorder.  He was found down at home in the bedroom incontinent  of stool and it was thought he had a recurrent seizure, however, it was  unwitnessed and he was brought to the hospital for evaluation.  Please  see the H and P for past medical history, social history, and family  history.   MEDICATIONS:  On admission included;  1. Aggrenox 1 tablet b.i.d.  2. Dilantin 300 mg b.i.d.  3. Keppra 1250 mg b.i.d.  4. Pancrease 1 tablet daily.  5. Cormax 1 tablet b.i.d.  6. Per family's report, Lamictal 25 mg b.i.d.   HOSPITAL COURSE:  The patient was admitted to a telemetry unit.  1. Neuro.  The patient had no recurrent seizure disorder.  EEG/ECT is      noted.  Dr. Orlin Hilding saw the patient in consultation.  Dilantin      level was checked and found to be therapeutic.  The patient was to      continue on his dilantin dose at home as well as his Keppra dose  from home.  The patient had been given a dose of Lamictal.  At the      time of discharge, it was unclear as to whether he was to continue      on Lamictal or not, and I have asked the patient's daughter to      contact Dr. Bethann Goo office on Monday, June 19, 2007, for      clarification and confirmation of his medications.  With the patient being seizure free, with his evaluation being  completed, with neuro consultation having no new recommendations, the  patient is thought to be stable and ready for discharge home.  1. History of CVA.  The patient had no neurologic abnormalities on      examination and had no evidence of new CVA.  Plan is to continue      Aggrenox.  2. Low-grade fever.  No evidence of infection was noted.  The patient      was afebrile for 48 hours prior to discharge.   DISCHARGE EXAMINATION:  GENERAL:  The patient is sitting up in bed.  He  is awake.  He is alert.  He is in no distress.  VITAL SIGNS:  Revealed the patient to have temperature of 98.2, blood  pressure 122/79, heart rate 84, and respirations were 20.  CHEST:  The  patient's chest is clear with no rales, wheezes, or rhonchi.  CARDIOVASCULAR:  Radial pulse 2+ with a regular rate and  rhythm without  murmurs, rubs, or gallops.  ABDOMEN:  Soft.  NEUROLOGIC EXAM:  The patient is awake, alert, sitting upright in the  bed, cooperative, and following commands.  Cranial nerves II through XII  are grossly intact with normal facial symmetry and movement.  Extraocular muscles are intact.  Cerebellar function seems to be normal  with no cogwheeling or rigidity.   FINAL LABORATORY:  Dilantin level on the day of discharge was 10.  Blood  cultures with no growth at the time of discharge.  Cardiac panel was  negative with a troponin level at 0.02.  Comprehensive metabolic panel  from June 17, 2007.    INCOMPLETE DICTATION.      Rosalyn Gess Norins, MD  Electronically Signed     MEN/MEDQ  D:  06/18/2007  T:  06/19/2007  Job:  [id number]

## 2010-08-04 NOTE — Discharge Summary (Signed)
NAME:  [name], [name]               ACCOUNT NO.:  [id number]   MEDICAL RECORD NO.:  [id number]          PATIENT TYPE:  INP   LOCATION:  4706                         FACILITY:  [facility]   PHYSICIAN:  Georgina Quint. Plotnikov, MDDATE OF BIRTH:  11/05/1940   DATE OF ADMISSION:  10/11/2007  DATE OF DISCHARGE:  10/13/2007                               DISCHARGE SUMMARY   DISCHARGE DIAGNOSES:  1. Breakthrough seizure in setting of subtherapeutic Dilantin level.  2. Elevated troponin/elevated CK level likely secondary to seizure.  3. Mild hypokalemia.  4. History of cerebrovascular accident with right-sided hemiparesis      and progressive aphasia, currently at baseline.  5. History of pancreatitis secondary to alcohol abuse.  6. Allergies.  7. Hypertension.  8. History of aspiration pneumonia.  9. Dementia, previously on Aricept.  10.History of medical noncompliance.  11.Hyperlipidemia.   HISTORY OF PRESENT ILLNESS:  [name] is a 70 year old African American  male who was admitted on October 12, 2007 following seizure, which was  witnessed by the family.  Family notes that the patient had been  followed by Neurology and had recently been tapered off Dilantin with  the last dose being on Monday, October 09, 2007.  Simultaneously, the  patient's Keppra dose had been increased from 1250 mg p.o. b.i.d. to  1500 mg p.o. b.i.d.  The patient was admitted and found to have  subtherapeutic INR, which is to be expected as Dilantin had been  discontinued.  He was admitted for further evaluation and treatment.   COURSE OF HOSPITALIZATION:  Breakthrough seizure.  The patient was  admitted.  He was loaded with IV Dilantin and had no further seizure  activity during this admission.  He did continue with expressive aphasia  at his baseline and some mild confusion, which is typical of his  postictal state several days out of seizure.  He is approaching mental  status baseline, and the case was discussed with  Neurology.  Plan at  this time is to continue Dilantin with very close outpatient monitoring.  Apparently, he had very labile Dilantin levels which became toxic on  several occasions, and plan at this time is for very close outpatient  monitoring with one last trial of Dilantin with a goal for low  therapeutic range.  We will also continue Keppra at increased dosing.  A  call has been left to [facility] for request for Dilantin level  draw next week as well as close outpatient followup in the office in  approximately 2 weeks.  Their office said that they will contact the  patient with an appointment for those two things.   MEDICATIONS AT THE TIME OF DISCHARGE:  1. Dilantin 100 mg tabs 3 tablets p.o. b.i.d.  2. Keppra 500 mg tabs 3 tablets p.o. b.i.d.  3. Pancrease 1 cap q.a.c. and nightly.  4. Aggrenox 1 cap p.o. b.i.d.   PERTINENT LABORATORY DATA AT THE TIME OF DISCHARGE:  Hemoglobin 12.2,  hematocrit 37.2, BUN 6, and creatinine 0.75.   DISPOSITION:  Discharged to home.   FOLLOWUP:  Follow up in neurology office for  Dilantin level in 1 week  and with Dr. Lovell Sheehan as needed as well follow up for [name] visit with  Neurology in approximately 2 weeks.      Sandford Craze, NP      Georgina Quint. Plotnikov, MD  Electronically Signed    MO/MEDQ  D:  10/13/2007  T:  10/14/2007  Job:  [id number]   cc:   Santina Evans A. Orlin Hilding, [name]  Stacie Glaze, MD

## 2010-08-04 NOTE — Consult Note (Signed)
NAME:  MILLIE, SHORB NO.:  [id number]   MEDICAL RECORD NO.:  [id number]          PATIENT TYPE:  INP   LOCATION:  2108                         FACILITY:  [facility]   PHYSICIAN:  Melvyn Novas, [name]  DATE OF BIRTH:  [birth year]   DATE OF CONSULTATION:  08/17/2006  DATE OF DISCHARGE:                                 CONSULTATION   [name] is a known 70 year old previously right-handed African  American gentleman, single,, and living alone, who has suffered a stroke  in 2006 when he resided in the [place] area.  Since the stroke he had  recurrent seizures.  There is also a history of prolonged ETOH abuse and  as of notes from the year 2006, was still actively drinking and was  admitted for intoxication and DTs. Due to his stroke in the left MCA  distribution in October 2006, he suffered a right hemiparesis and he  also is considered to be demented according to a previous neurologic  consultation.   This gentleman arrived yesterday evening at the [facility] ER with  confusion, increased aphasia and agitation.  A family member had found  him confused when returning home from work.  The patient had no observed  seizure, but it was presumed that he might be postictal.  There was no  additional motor deficit noticed, no neck rigidity.  In the ER, the  patient was revealed to be febrile at 101.4 degrees Fahrenheit.  He was  tachycardiac. It was unclear if he had any nausea, vomiting, chest pain,  headaches, abdominal pain or gait disorder.  There was no evidence of  any trauma, no abrasion or bruising noted, no tongue bites.  A chest x-  ray was obtained in the ER which was clear.  A urinanalysis showed no  UTI so the source for the fever was to not known.  A CT of the head did  not show a repeated stroke.   SOCIAL HISTORY:  He is living alone according to his daughter, at least  he is along during the day when all the family members are working, that  is what  I understood. It is not clear if the patient is now sober. She  does not know how much he might be drinking, believes that he is sober  for over a year.   FAMILY HISTORY:  Family history is positive for hypertension.   CURRENT MEDICATIONS:  The patient was at home taking Dilantin 300 mg  at  bedtime p.o., aspirin, a full dose once a day, Claritin p.r.n. and  Keppra 500 mg b.i.d.   ALLERGIES:  There are no listed allergies.   PHYSICAL EXAMINATION:  VITAL SIGNS:  The patient is stable by vital  signs.  He still tachycardiac between 100 and 110 beats per minute but  his blood pressure is an 125/80, temperature is now 99 degrees  Fahrenheit, respiratory rate is 16.  LUNGS:  Are clear to auscultation.  HEART: Regular rate and rhythm.  No murmur.  The patient has good  peripheral pulses.  EXTREMITIES:  No clubbing, cyanosis, no edema.  NECK:  No neck vein distension, goiter or neck rigor.  NEUROLOGIC EXAMINATION:  The patient is very confused, does not follow  even simple motor commands, does repeatedly state that I can feel that  even when I am not performing the sensory examination.  When not asked  to give me any answer, he suddenly stated that is great.  He cannot  state his name, place, or birthday for me.  His pupils are bilaterally  equal size about 4 mm.  There is no evidence of nystagmus.  He cannot  follow a moving object with a gaze. A visual field examination is  impossible..  The patient does not show any facial weakness, no tongue  deviation and no tongue bite.  He cannot comprehend even a simple motor  examination at this stage.  He was able to turn his head and his eyes to  acoustic stimuli towards the sound, however, he was not doing this by  verbal command only. Evident by motor exam is a right hemiparesis that  affects arm and leg. The leg is slightly outward rotated when the  patient is in supine position.  He has increased deep tendon reflexes on  the right.  There  is a sign of a right-sided spastic hemiparesis and an  upgoing toe to plantar stimulation on the right.  The patient could not  provide any grip strength or perform a finger-nose to exam. He could not  answer questions about sensory modalities.   ASSESSMENT:  Confused possible postictal but in the differential to be  considered are encephalitis of viral origin given his tachycardia and  his body temperature and also the possibility of a recurrent stroke,  also the CT was negative.   PLAN:  The patient was loaded IV with Keppra so the Keppra level is  difficult to obtain.  A phenytoin level was therapeutic.  I agree with  the coverage by ampicillin, Rocephin and antiviral medication and  recommend to continue this until a herpes PCR from CSF returns.  I also  ordered an EEG and an MRI of the brain with and without gadolinium.  I  would strongly recommend an LP under fluoroscopy.      Melvyn Novas, [name]  Electronically Signed     CD/MEDQ  D:  08/17/2006  T:  08/17/2006  Job:  [id number]   cc:   Lowella Bandy, MD  Marolyn Hammock. Thad Ranger, [name]

## 2010-08-04 NOTE — Discharge Summary (Signed)
NAMEDEUNDRA, BARD               ACCOUNT NO.:  [id number]   MEDICAL RECORD NO.:  [id number]          PATIENT TYPE:  INP   LOCATION:  3735                         FACILITY:  [facility]   PHYSICIAN:  Valerie A. Felicity Coyer, MDDATE OF BIRTH:  05-19-1940   DATE OF ADMISSION:  10/13/2006  DATE OF DISCHARGE:  10/14/2006                               DISCHARGE SUMMARY   DISCHARGE DIAGNOSES:  1. Breakthrough seizure activity.  2. Questionable left lower lobe pneumonia.  3. History of hemorrhagic cerebrovascular accident.  4. History of pancreatitis/remote alcohol abuse.   HISTORY OF PRESENT ILLNESS:  Mr. Azevedo is a 70 year old male who was  admitted on October 13, 2006 with seizure.  He was last hospitalized in May  of 2008 for seizure.  It was reported that the patient was driving and  felt the seizure coming on.  As a result, he pulled over to the side of  the road and a passer-by called 911.  EMS stated that when they found  the patient he was having a grand mal seizure which lasted about 10  minutes.  He was given 5 mg of IV Valium, with no further seizure  activity.  He was admitted for further evaluation and treatment.   COURSE OF HOSPITALIZATION:  Breakthrough seizure.  The patient was  admitted.  He was postictal at the time of admission and remained  lethargic for some time after the seizure.  His seizures medications  were given intravenously during his episode of lethargy.  A neurology  consult was obtained, and the patient was seen in consultation by Dr.  Melvyn Novas, who recommended medication changes.  Please see below.  In addition, she recommended continuing Aggrenox or changing to aspirin.   MEDICATIONS AT THE TIME OF DISCHARGE:  1. Keppra 500 mg p.o. in the a.m. and 750 mg p.o. in the p.m.  2. Dilantin 100 mg p.o. in the a.m. and 300 mg p.o. in the p.m.  3. Aggrenox 1 tab p.o. b.i.d.  4. Avelox 400 mg every day for 5 additional days.  5. Pancrease 1 tab p.o. t.i.d. with  meals.   PERTINENT LABORATORIES AT THE TIME OF DISCHARGE:  Hemoglobin 12.5,  hematocrit 38.1, Dilantin 10.6.   DISPOSITION:  Patient is discharged to home.  He is instructed to follow  up with Dr. Lovell Sheehan as needed, and to follow up with Dr. Marcelino Freestone on November 04, 2006 at 10:00 a.m.      Sandford Craze, NP      Raenette Rover. Felicity Coyer, MD  Electronically Signed    MO/MEDQ  D:  10/14/2006  T:  10/15/2006  Job:  [id number]   cc:   Santina Evans A. Orlin Hilding, [name]  Stacie Glaze, MD

## 2010-08-04 NOTE — Discharge Summary (Signed)
NAMESALEH, ULBRICH NO.:  [id number]   MEDICAL RECORD NO.:  [id number]          PATIENT TYPE:  INP   LOCATION:  2029                         FACILITY:  [facility]   PHYSICIAN:  Raenette Rover. Felicity Coyer, MDDATE OF BIRTH:  12-Feb-1941   DATE OF ADMISSION:  08/16/2006  DATE OF DISCHARGE:  08/21/2006                               DISCHARGE SUMMARY   DISCHARGE DIAGNOSES:  1. Altered mental status/aphasia in setting of fever and leukocytosis.  2. Agitation.  3. History of seizure disorder.  4. Rule out meningitis, rule out viral encephalitis.  5. History of hemorrhagic cerebrovascular accident.  6. History of pancreatitis/remote alcohol abuse.   HISTORY OF PRESENT ILLNESS:  [name] is Brent 70 year old male who was  admitted on Aug 17, 2006, with altered mental status.  He has Brent known  history of hemorrhagic CVA and seizure disorder and developed some type  of aphasia on the evening of admission.  He was unable to communicate  with his neighbor and call his sister.  He became very agitated after  sister arrived.  There was no witnessed seizure.  He was admitted for  further evaluation and treatment.   PAST MEDICAL HISTORY:  1. Hemorrhagic CVA.  2. Seizure disorder.  3. History of alcoholic pancreatitis.   HOSPITAL COURSE:  The patient was admitted and was noted to have  leukocytosis as well as Brent fever and it was felt that he likely had  developed an aspiration pneumonia.  Antibiotics which included IV  Rocephin, vancomycin and ampicillin.  He was also treated with  acyclovir.  Initially, an LP was requested, however, the patient's  daughter asked that LP not be pursued unless absolutely necessary.  As Brent  result, LP was not pursued.  He was continued on empiric antibiotic  treatment.  In addition, he was seen in consultation during this  admission by Dr. Porfirio Mylar Dohmeier of neurology.   DISCHARGE MEDICATIONS:  1. Omnicef 300 mg p.o. b.i.d. x6 days.  2. Acyclovir 800 mg  p.o. t.i.d. x6 days.  3. Aggrenox one tablet p.o. b.i.d.  4. Keppra 500 mg p.o. b.i.d.  5. Dilantin 300 mg p.o. daily.   DISCHARGE LABORATORY DATA AND X-RAY FINDINGS:  BUN 4, creatinine 1.  Hemoglobin 11.9.   DISPOSITION:  The patient was discharged to home.   FOLLOW UP:  He was instructed to follow up with Dr. Darryll Capers and  call for an appointment 1-2 weeks after discharge.      Sandford Craze, NP      Raenette Rover. Felicity Coyer, MD  Electronically Signed    MO/MEDQ  D:  10/07/2006  T:  10/08/2006  Job:  [id number]

## 2010-08-04 NOTE — Procedures (Signed)
EEG NUMBER:  [id number]   REQUESTING PHYSICIAN:  Dr. Rene Paci.   HISTORY:  This is a routine EEG done without photic stimulation or  hyperventilation.  The patient is described as awake and asleep,  although only apparent sleep is recorded for the EEG tracing.  This is a  70 year old man admitted with seizures and confusion.  EEG is performed  for evaluation.   DESCRIPTION:  No discernible waking background is seen in this tracing.  It appears the record is primarily made during sleep, as evidenced by  diffuse appearance of theta rhythms, especially central dominant of 4-6  Hz, with some occasional frontal slowing into the 3-4 Hz range and the  presence of sleep spindles and vertex waves.  Intermittently throughout  the record single spike discharge  and occasional sharp wave discharges  appear focused in the left frontal temporal area around the F3 and T3  electrodes.  These are never persistent in rhythm to the point that they  might be called seizure.  Photic stimulation and hyperventilation are  not performed.  Single channel devoted to EKG revealed sinus rhythm  throughout with a rate of approximately 84 beats minute.   CONCLUSIONS:  Abnormal study in the sleep state due to the presence of  intermittent left frontal temporal spikes and sharp wave activity, a  finding suggestive of underlying focal pathology with potential  epileptogenicity.  No persistent seizure activity as might be seen in  focal status epilepticus is seen.      Michael L. Thad Ranger, [name]  Electronically Signed     EAV:WUJW  D:  06/16/2007 16:52:50  T:  06/17/2007 18:14:23  Job #:  [id number]

## 2010-08-04 NOTE — Consult Note (Signed)
NAME:  [name], [name] NO.:  [id number]   MEDICAL RECORD NO.:  [id number]          PATIENT TYPE:  INP   LOCATION:  3735                         FACILITY:  [facility]   PHYSICIAN:  Melvyn Novas, [name]  DATE OF BIRTH:  Jul 25, 1940   DATE OF CONSULTATION:  10/14/2006  DATE OF DISCHARGE:                                 CONSULTATION   REASON FOR CONSULTATION:  This gentleman is a 70 year old, right-handed,  African American gentleman with a history of remote alcohol abuse, sober  for about 2 years, with a history of a  seizure disorder attributed to a  previous hemorrhagic stroke (that affected the left MCA distribution  )and the patient also has hypertension.  He arrived at the hospital with a recurrent seizure.  The last one took  place in May of this year.  Phenytoin level was just above 10 and the  patient was slightly febrile and postictally found to be very somnolent  and confused.  He had no memory of the event and upon awakening yesterday, showed  nonfluent aphasia, was coughing and had mild Todd paralysis affecting  the right previously spastic side of his body.  He is now treated for aspiration pneumonia and was placed on Avelox.  A CT of the head showed no recurrent stroke.  An MRI and EEG have not  been done this admission.  The patient himself cannot give a review of systems.  He appears still  somewhat lethargic with word-finding difficulties, but he has been able  to express himself verbally, it just takes him longer.  He is also able to circumscribe the words he cannot find and communicate  that way.   SOCIAL HISTORY:  The patient lives alone, but his adult daughter has  been close to him.  He has been taking his medicines regularly as his blood levels show.  He has been sober.  There was no alcohol in his blood at this time.    There was no history of nicotine abuse and he has been physically well  nourished and well groomed.   FAMILY HISTORY:   Positive for hypertension in both parents.   MEDICATIONS:  The patient was at home taking Keppra 5 mg b.i.d.,  Dilantin 300 mg at night.  Avelox was given here intravenously to prevent the aspiration pneumonia.  He was on Aggrenox and Claritin.   PAST MEDICAL HISTORY:  1. Hemorrhagic left MCA in 2006.  At the time, he resided in [place]      and the stroke was treated there.  He also had rehab there and      recovered good motor skills in his right body.  2. Remote history of alcohol abuse.  3. Seizure disorder since the hemorrhagic stroke.  4. Hypertension.   PHYSICAL EXAMINATION:  GENERAL:  The patient is now slowly recovering.  The Todd paralysis has resolved.  He is still somewhat somnolent and  confused.  Last night, he was unable to use a urinal and a Foley  catheter was placed.  This caused him to have some pain and he was also  coughing, but he was not in acute distress, pleasant and cooperative.  VITAL SIGNS:  Blood pressure 126/84, heart rate 88, respiratory rate is  16-18.  He is not diaphoretic.  NECK:  He has no neck vein distension.  ABDOMEN:  No abdominal tenderness.  EXTREMITIES:  No edema.   LABORATORY DATA AND X-RAY FINDINGS:  Laboratory results of the ER show a  therapeutic phenytoin level in the low normal range at 10.6.  A CT had  been obtained which showed no recurrent stroke.  The patient is  borderline anemic and has a normal creatinine clearance and no elevated  transaminases.  There has been a history of pancreatitis reported in one  of his previous dictations.  Amylase and lipase were not checked for  this time.   MENTAL STATUS EXAMINATION:  The patient is still drowsy, but arousable.  He still has word-finding difficulties, but he is able to follow single-  step commands.  He is disoriented to the date.  He can give me part of  his birthday.  He knows that he was born on [birth year], but cannot give me  the year.  He asked about the place he is in.  He  said, I do not think that it is  a school, I think it is a hospital.  Cranial nerve examination with pupils equal in size at 4 mm.  Ophthalmoscopic examination shows no retinal abnormalities or optic  nerve swelling.  The patient does follow a moving object with his gaze  to the left, but only to about the middle of his expected right visual  field.    He does not follow an object all the way to the right.  He does not  have a facial asymmetry.  Tongue and uvula move midline.  He has good bilateral eye closure.  Motor examination:  He can move all  four extremities and can elevate both legs over 5 seconds to  antigravity.  He can elevate both arms over 10 seconds.  Right-sided  pronator drift is seen with prolonged extension.  Deep tendon reflexes throughout the right are now elevated again and he  has an upgoing toe to Babinski maneuver on the right.  Sensory:  The patient is not able to tell me any differences in touch or  pinprick, left versus right.  Coordination:  He has dysmetria with finger-to-nose test with his right  arm only that seems to be out of proportion to the weakness.   ASSESSMENT:  The patient had a recurrent seizure.   He has a known seizure disorder attributed to the hemorrhagic stroke in  2006, in the left middle cerebral artery distribution.  He is recovering  from his Todd paralysis and from his confusion.  His level of alertness  should sufficient to place him back on oral medication and all food.   RECOMMENDATIONS:  Increase the presently used antiepileptic medication  without introduction of a new medication to achieve higher blood levels.  Keppra will be increased from 500 mg b.i.d. p.o. to 500 mg in the  morning and 750 mg in the evening.  Dilantin will be increased to an  additional dose of 100 mg in a.m.  The patient will keep taking Aggrenox.  The patient was advised, in the presence of his daughter, that he will  need an outpatient visit with a  neurology office.  I also asked the  patient in the presence of his daughter to acknowledge that he has  driving restrictions and restrictions in operating machinery.  I have made a note on his discharge sheet in regards to this as well.  He is in need of a neurologic evaluation before he will be released back  on the road if my colleague decides to do so.      Melvyn Novas, [name]  Electronically Signed     CD/MEDQ  D:  10/14/2006  T:  10/14/2006  Job:  [id number]   cc:   Santina Evans A. Orlin Hilding, [name]

## 2010-08-04 NOTE — H&P (Signed)
[name], [name]               ACCOUNT NO.:  [id number]   MEDICAL RECORD NO.:  [id number]          PATIENT TYPE:  INP   LOCATION:  3735                         FACILITY:  [facility]   PHYSICIAN:  Kela Millin, M.D.DATE OF BIRTH:  July 15, 1940   DATE OF ADMISSION:  10/12/2006  DATE OF DISCHARGE:                              HISTORY & PHYSICAL   PRIMARY CARE PHYSICIAN:  Dr. Lovell Sheehan, [place].   CHIEF COMPLAINT:  Seizures.   HISTORY OF PRESENT ILLNESS:  The patient is a 70 year old, black male  with past medical history significant for seizure disorder last  hospitalized in May 2008, for the same, also history of hemorrhagic CVA,  history of pancreatitis with remote alcohol abuse and questionable  history of meningitis, status post empiric antibiotics and also history  of chronic sinus problems/allergies who presents with the above  complaint.  The history is obtained from ER staff as well as family at  the bedside as the patient is somnolent at this time.  It is reported  that the patient was driving and felt seizure coming on, so he pulled  over to the side of the road and a passerby called 9-1-1.  EMS stated  that when they found the patient, he was having a grand mal seizure  which lasted about 10 minutes and he was given 5 mg of IV Valium and no  further seizures.  Upon arrival in the ER, the patient was postictal,  lethargic with noted attempts to alert to voice.  Per family, no reports  of fevers, chest pain, shortness of breath, focal weakness, no diarrhea  and no hematochezia.   In the ER, the patient had a CT scan of the head which showed no acute  intracranial findings, diffuse ethmoid sinus disease noted.  His  Dilantin level was 10.6 and he is admitted for observation, further  evaluation and management.   PAST MEDICAL HISTORY:  As above.   MEDICATIONS:  1. Aggrenox one p.o. b.i.d.  2. Keppra 500 mg b.i.d.  3. Pancrease one daily.  4. Dilantin 300 mg p.o.  daily.   ALLERGIES:  No known drug allergies.   SOCIAL HISTORY:  Remote history of alcohol use.  Per family, no tobacco.   FAMILY HISTORY:  Noncontributory.   REVIEW OF SYSTEMS:  As per HPI, otherwise unobtainable.   PHYSICAL EXAMINATION:  GENERAL:  The patient is an elderly, black male  who is somnolent, opens eyes to voice momentarily in no respiratory  distress.  VITAL SIGNS:  Temperature 98.4, T-max 100.1 in the ER, blood pressure  158/96, pulse 102, respirations 20, O2 saturations 100%.  HEENT:  Normocephalic, atraumatic, EOMI.  Slightly dry mucous membranes.  Sclerae anicteric.  NECK:  Supple.  No adenopathy, no carotid bruits appreciated.  LUNGS:  Decreased breath sounds at the bases, otherwise clear.  ABDOMEN:  Soft, normal bowel sounds present, nontender, nondistended.  No masses palpable.  EXTREMITIES:  No cyanosis and no edema.  NEUROLOGIC:  He opens eyes to voice, does not follow commands.  Neurologic exam limited.  The left toe is downgoing and the right  equivocal.   LABORATORY DATA AND X-RAY FINDINGS:  CT scan of head as per HPI.   White cell count is 8.8. hemoglobin 12.5, hematocrit 38.1, platelet  count 253, neutrophil count 90%.  Sodium 138, potassium 3.9, chloride  104, BUN 8, glucose 124.  ABG with pH 7.24 with pCO2 63.7.  Dilantin  level is 10.6.   ASSESSMENT/PLAN:  1. Seizure disorder.  As discussed above, recurrent seizures as his      last hospitalization was in May for same, per family.  Dilantin      level as above 10.6.  Ativan as needed for seizures.  Resume Keppra      and Dilantin once the patient is able to take p.o.  Consider      neurologic consult in morning per primary team.  2. Hypercarbia.  Follow and recheck arterial blood gas and further      manage as appropriate.  3. History of pancreatitis.  Continue Pancrease.  4. History of hemorrhagic cerebrovascular accident, CT scan as above      with no acute findings.      Kela Millin, [name]  Electronically Signed     ACV/MEDQ  D:  10/13/2006  T:  10/13/2006  Job:  [id number]   cc:   Lovell Sheehan, [name]

## 2010-08-04 NOTE — Consult Note (Signed)
NAME:  [name], [name]               ACCOUNT NO.:  [id number]   MEDICAL RECORD NO.:  [id number]          PATIENT TYPE:  INP   LOCATION:  3705                         FACILITY:  [facility]   PHYSICIAN:  Casimiro Needle L. Reynolds, M.D.DATE OF BIRTH:  1940/08/16   DATE OF CONSULTATION:  06/16/2007  DATE OF DISCHARGE:                                 CONSULTATION   REQUESTING PHYSICIAN:  Valerie A. Felicity Coyer, MD   REASON FOR EVALUATION:  Suspected seizure and aphasia.   HISTORY OF PRESENT ILLNESS:  This is an inpatient consultation  evaluation of this existing [facility] patient, a 70-  year-old man with a known past medical history which includes a previous  left brain stroke with resultant right hemiparesis and aphasia, which he  suffered in [place] in 2006.  The patient made a reasonably good  recovery in terms of motor function from that but has had some residual  difficulties with aphasia and memory difficulties.  He has also since  that time had epilepsy and has been treated with Dilantin and  subsequently Keppra.  He is followed in our office by Elveria Rising,  NP, and Dr. Marcelino Freestone.  He was last seen in our office on  March 29, 2007, at which time he had a breakthrough seizure over the  preceding holiday.  He was continued on Dilantin 300 mg q.h.s. and his  Keppra dose was increased from 1000 mg b.i.d. to 1250 mg b.i.d.  He has  been admitted to the hospital four or five times in the past for  episodes in which he was found down, more aphasic than usual, with a  presumed seizure.  He was admitted early this morning for virtually the  same thing.  Note that all history is from chart as there is no other  care [name] available and the patient himself cannot provide history.  It seems that earlier today the patient's daughter called him and he was  at his baseline, but she could not get hold of him later in the day  yesterday and came to his house late last  night, finding him down on the  ground, incontinent of feces.  He was then brought to the emergency  department, where he was noted to be simply aphasic.  He has had no  witnessed seizure activity here.  He had a fever of 101.5 on admission  evaluation in the emergency department but has defervesced and has not  had any further fever.  He has been continued on his same medications.  He has remained fairly aphasic.  Is not clear what his baseline is,  although it does seem that he is a little bit worse than his baseline  looking at the notes from our office.  Neurologic consultation is  requested.  The patient himself is aphasic and can only say repeatedly  that, they've come to talk my everything.Marland Kitchen   PAST HISTORY:  As above.  He has a history of remote alcoholism and  pancreatitis.  He has been treated for hypertension in the past.  He has  been treated for a  question of dementia but is not on medication for  that at this time.  He has been suspected of being noncompliant  medications in the past, although he has had reasonably good levels of  Dilantin when checked in the office.   Family/social/review of systems per admission H&P of Dr. Adela Glimpse from  last night, which is reviewed.   MEDICATIONS:  According to our records, his medication list includes:   1. Aggrenox b.i.d.  2. Dilantin 300 mg nightly.  3. Keppra 1000 mg b.i.d.  4. Pangestyme 2 tablets three times daily.  5. Multivitamin.   ALLERGIES:  No known drug allergies.   PHYSICAL EXAMINATION:  VITAL SIGNS:  Temperature of 99.0, blood pressure  124/83, pulse 99, respirations 18, O2 saturation 100% on 2 L O2 nasal  cannula.  GENERAL:  This is a healthy-appearing man supine in the hospital bed, in  no evident  Cranium is normocephalic and atraumatic.  Oropharynx benign.  NECK:  Supple without carotid or subclavicular bruits.  HEART:  Regular rate and rhythm without murmurs.  NEUROLOGIC:  Mental status:  He is awake and  alert.  He is really not  able to answer questions at all, speaking only in repeated phrases.  He  is able to follow some simple commands such as eye opening and closing  but nothing involving raising fingers or anything more complicated than  that.  He can repeat some words but not a complex phrase.  His affect is  a little bit agitated.  Cranial nerves:  Pupils are equal and reactive.  Extraocular movements are full without nystagmus.  He seems to respond  to visual stimuli from each side equally.  Face, tongue and palate move  normally and symmetrically.  Motor:  Normal bulk and tone.  Normal  strength of all tested extremity muscles, although he may have a little  bit of motor neglect on the right.  Sensory:  He seems to attend to  touch stimulus in all extremities.  Coordination:  He is not able to  understand commands to perform finger-to-nose testing.  Gait is  deferred.  Reflexes are 2+ symmetric.  Toe is upgoing on the right and  down the left.   LABORATORY DATA:  CBC from this morning:  White count 11.5, hemoglobin  12.9, platelets 227,000.  His white count last night in the ED was 11.6,  with evidence of demargination on his differential.  CMET from this  morning is remarkable for minimally elevated AST and alkaline  phosphatase.  CPK this morning is a little elevated at 254.  Coagulation  studies are normal.  Dilantin level done last night is 11.1, which  presumably would be a near-trough level.  He had an albumin of 4.1 at  the time.  He had a CT of the head in the ED done last night, which  showed no acute changes but did show scarring in the left temporal area.  This presumably is a consequence of his old stroke.   IMPRESSION:  1. Known post-stroke epilepsy with a presumed recurrent seizure event.      This was unwitnessed, the patient was found down in a situation      which corresponds with previous presumed breakthrough seizure      events.  2. History of left brain  stroke with aphasia.  He seems now to have a      worsening aphasia over his baseline, which is likely postictal      versus a  new stroke.  3. History of noncompliance with medications in the past.  Looking at      his blood, he is least taking his Dilantin, and I think it is      reasonable to assume he is taking his Keppra as well.   RECOMMENDATIONS:  We will continue taking the medications at the present  doses as outlined in our records.  This consists of Dilantin 300 mg  nightly. and Keppra 1250 mg b.i.d.  I think that he is not going to  achieve control on these two drugs alone, however.  I will suggest  adding Lamictal 25 mg nightly x1 week, then 25 mg b.i.d.  He should  follow-up in 2 weeks in our office with our nurse practitioner for  further titration of his medications.  If his aphasia is not returned to  baseline, an MRI would be reasonable, but I suspect that he will  gradually improve as he seems to have done this before.   Thank you for the consultation.      Michael L. Thad Ranger, [name]  Electronically Signed     MLR/MEDQ  D:  06/16/2007  T:  06/17/2007  Job:  [id number]

## 2010-08-04 NOTE — Discharge Summary (Signed)
NAME:  RUFINO, STAUP               ACCOUNT NO.:  [id number]   MEDICAL RECORD NO.:  [id number]          PATIENT TYPE:  INP   LOCATION:  4706                         FACILITY:  [facility]   PHYSICIAN:  Georgina Quint. Plotnikov, MDDATE OF BIRTH:  04-May-1940   DATE OF ADMISSION:  10/11/2007  DATE OF DISCHARGE:  10/13/2007                               DISCHARGE SUMMARY   ADDENDUM  Upon further discussion with the patient's daughter, she stated that the  patient had only been taking the Dilantin on an at bedtime dosing  schedule.  He was given 300 mg p.o. b.i.d. during the 24 hours that he  was here; however, we will continue Dilantin at 300 mg p.o. at bedtime  dosing schedule.  The patient's daughter noted that when he was on 400  mg p.o. at bedtime, he had supratherapeutic levels.  This will need to  be monitored closely as an outpatient.      Sandford Craze, NP      Georgina Quint. Plotnikov, MD  Electronically Signed    MO/MEDQ  D:  10/13/2007  T:  10/14/2007  Job:  [id number]   cc:   Stacie Glaze, MD  Gustavus Messing Orlin Hilding, [name]

## 2010-08-07 NOTE — Procedures (Signed)
EEG NUMBER:  [id number].   NOTE:  This is a repeat EEG ordered by Dr. Kelli Hope.  Date of study  is July 07, 2005, compared to a prior study dated July 05, 2005.   This was a routine EEG utilizing the International 10/20 lead placement  system, the 17 channels with 1 channel devoted to EKG.  The patient was  described clinically as being awake and drowsy.  Medications listed include  aspirin, Aricept, Dilantin, Claritin and Keppra.  The patient is a history  of alcoholism and a left brain stroke with residual aphasia and focal  seizures.  The previous EEG showed PLEDs, left predominant but fairly  diffuse.   The background consisted of an admixture of beta, alpha, theta and delta  activity.  There are still slow wave and spike and slow wave discharges seen  frequently in the left, but they have lost their regular periodicity and are  somewhat more random, but nonetheless quite frequent.  There is a somewhat  depressed background with low amplitude theta and beta activity.  Rare  fragments of alpha activity are seen, but are not well organized or  sustained.  The EKG reveals a relatively regular rhythm with a rate of 90  beats per minute.  Photic stimulation did not produce significant change in  the background activity.  Hyperventilation was not performed.   CONCLUSION:  While this study represents an improvement in comparison to the  study 2 days ago, there are still frequent left predominant slow wave and  spike and slow wave discharges which are left predominant but seen in both  hemispheres.  Clinical correlation is recommended.  This still indicates  some irritability and lowered seizure threshold.      Catherine A. Orlin Hilding, [name]  Electronically Signed     ZOX:WRUE  D:  07/07/2005 14:44:54  T:  07/08/2005 10:37:00  Job #:  [id number]   cc:   Casimiro Needle L. Thad Ranger, [name]  Fax: [phone]   Thomos Lemons, D.O. [facility]  [address] [place], [place] [address]

## 2010-08-07 NOTE — Consult Note (Signed)
[name], [name] NO.:  [id number]   MEDICAL RECORD NO.:  [id number]          PATIENT TYPE:  INP   LOCATION:  3102                         FACILITY:  [facility]   PHYSICIAN:  Gustavus Messing. Orlin Dominguez, M.D.DATE OF BIRTH:  Oct 02, 1940   DATE OF CONSULTATION:  06/06/2005  DATE OF DISCHARGE:                                   CONSULTATION   CHIEF COMPLAINT:  Seizure, right-sided weakness, mute.   HISTORY OF PRESENT ILLNESS:  [name] is a 70 year old black male with a  history of chronic alcoholism, dementia and presumed left brain stroke about  one year ago with residual mild aphasia but regained his strength.  He has  had some seizures after his stroke but seizure medication was discontinued  about one month ago, according to his girlfriend.  He has had several  seizures this morning and was brought in by EMS, described as generalized  tonic clonic seizure.  He has residual right-side and left leg weakness and  mutism after seizure.  The left lower extremity and right upper extremity  have now resolved.  He is still virtually mute, however.   REVIEW OF SYSTEMS:  Unobtainable, except from his medications, I deduce that  he has erectile dysfunction.   PAST MEDICAL HISTORY:  1.  Left brain stroke about a year ago in [place], [place], with      residual word finding errors but he recovered motor function.  2.  Alcoholism.  3.  Dementia.   MEDICATIONS:  1.  Aricept 5 mg once a day.  2.  Allegra 180 mg a day.  3.  Levitra as needed.   ALLERGIES:  NO KNOWN DRUG ALLERGIES.   SOCIAL HISTORY:  He is single, has a girlfriend, has one daughter.  He is a  recovering alcoholic.  According to his girlfriend, he is not currently  drinking.  No cigarettes.   FAMILY HISTORY:  Negative for stroke.   PHYSICAL EXAMINATION:  VITAL SIGNS:  Temperature 97.8, pulse 120, blood  pressure 139/72, respirations 22.  HEENT:  Head is normocephalic and atraumatic.  NECK:  Supple  without bruits.  CARDIOVASCULAR:  Regular rate and rhythm.  NEUROLOGIC:  He is still postictal and combative so I am not really able to  do a full detailed exam.  Mental status:  He appears alert and almost  hypervigilant but he is mute except for expletives.  He does not follow  commands.  Cranial nerves:  There is no facial asymmetry.  His pupils are  equal and reactive.  He has full extraocular movements.  He is not really  blinking to threat on either side.  Motor exam:  He is moving left upper and  lower extremity well.  Right upper extremity vigorously.  He is not moving  the right lower extremity at this time spontaneously or to command.  Deep  tendon reflexes are absent.  Downgoing toes to plantar stimulation.  Coordination:  He is not cooperative but there is no evidence of ataxia.  Sensory:  He withdraws to pain and winces on all four extremities.   CT  shows an old left subinsular inferior insular temporal region infarct.   LABORATORY DATA:  Unremarkable except for a bilirubin of 1.4.   IMPRESSION:  Seizures probably secondary to his previous stroke.  I suspect  he has complex partial seizure with secondary generalization.  Cannot rule  out a new stroke at this point but I doubt that.  It is more like a Todd's  phenomenon with the residual mutism and right hemiparesis which are quickly  resolving.   RECOMMENDATIONS:  Would agree with Dilantin for now until we can establish  what his anticonvulsant was and then switch him back to that.  If control  was poor on that, would consider adding Keppra for the focal component.  He  needs an MRI to rule out new stroke and an EEG.      [name], [name]  Electronically Signed     CAW/MEDQ  D:  06/06/2005  T:  06/07/2005  Job:  [id number]

## 2010-08-07 NOTE — Procedures (Signed)
CLINICAL INFORMATION:  This patient has a history of alcoholism, left brain  stroke with residual aphasia, and it was admitted with seizure.   TECHNICAL DESCRIPTION:  This EEG was recorded during the awake state and  shows evidence of low-voltage fast beta activity and movement artifact. The  background activity shows 16 Hz rhythms with higher amplitudes seen at  posterior head regions. There is eye movement and blink artifact seen  throughout the study as well as movement and muscle artifact. No evidence of  any focal asymmetry is present. No evidence of any stage II sleep is  present. No evidence of any epileptiform activity is seen. Photic  stimulation and hyperventilation testing were not performed.   IMPRESSION:  This is an abnormal EEG showing evidence of diffuse beta  activity without well-formed normal alpha background rhythms. This can be  seen with multiple etiologies including benzodiazepine and anxiety  medication effect or in a tense, anxious individual. These findings should  be taken in account of clinical state of the patient.           ______________________________  Genene Churn. Sandria Manly, [name]     HYQ:MVHQ  D:  06/07/2005 13:20:45  T:  06/08/2005 11:31:29  Job #:  [id number]

## 2010-08-07 NOTE — Discharge Summary (Signed)
[name], [name] NO.:  [id number]   MEDICAL RECORD NO.:  [id number]          PATIENT TYPE:  INP   LOCATION:  3007                         FACILITY:  [facility]   PHYSICIAN:  Rene Paci, [name] LHCDATE OF BIRTH:  1940-09-06   DATE OF ADMISSION:  06/06/2005  DATE OF DISCHARGE:  06/09/2005                                 DISCHARGE SUMMARY   DISCHARGE DIAGNOSIS:  Status post complex partial seizure secondary to  previous hemorrhagic cerebrovascular accident.   HISTORY OF PRESENT ILLNESS:  The patient is a 70 year old African-American  male with a past medical history of alcohol abuse and a recent left  cerebrovascular accident in October of 2006, who presented to the emergency  department with seizure. The patient was admitted for further evaluation and  treatment.   PAST MEDICAL HISTORY:  1.  History of alcohol abuse.  2.  Left cerebrovascular accident/possible hemorrhagic.  3.  Presumed dementia.   HOSPITAL COURSE BY PROBLEM:  #1.  STATUS POST COMPLEX PARTIAL SEIZURE,  SECONDARY TO RECENT HEMORRHAGIC STROKE:  The patient was admitted and  underwent an MRI. MRI showed no acute intracranial abnormality. It did note  mild cerebral atrophy and remote lacunar infarcts in the right thalamus. A  CT head was also performed on admission, which showed no acute intracranial  abnormality and did not sinusitis. The patient underwent an EEG, which  showed abnormal diffuse beta activity. He was evaluated by Dr. Juliette Mangle of neurology, who recommended continuing aspirin and the patient was  placed on Dilantin 300 mg p.o. q.h.s. He has had no further seizure activity  this admission. He will need a followup Dilantin level performed in 10 days  and a followup appointment has been scheduled for the patient to see his  primary care physician, Dr. Darryll Capers.   The patient did have some mild aphasia noted during this admission as well  as some right upper extremity  and right lower extremity weakness. He has  shown improvement in weakness and is able to say some simple phrases. He was  evaluated by PT and OT as well as speech during this admission. The family  wishes to take the patient home. Of note, he did have an inpatient  rehabilitation consult and it was recommended that he go home with home  health.   DISCHARGE MEDICATIONS:  1.  Aricept 5 mg p.o. q.h.s.  2.  Dilantin 300 mg p.o. at bedtime.  3.  Aspirin 325 mg once daily.  4.  Allegra 180 mg p.o. daily as needed.  5.  Wellbutrin as needed.   LABORATORY DATA:  At discharge, urine culture negative. BUN 9, creatinine  0.8. Hemoglobin and hematocrit 13.2 and 39.6.   FOLLOW UP:  The patient is scheduled to followup with Dr. Darryll Capers on  June 17, 2005 at 3:45. At this appointment, he will need a followup  Dilantin level performed.   DISPOSITION:  Plan to discharge patient home with home health PT, OT, and  speech therapy.   SPECIAL INSTRUCTIONS:  The family is instructed to bring the patient back to  the emergency room should he develop any further seizure activity.      Melissa S. Peggyann Juba, NP      Rene Paci, [name] [facility]  Electronically Signed    MSO/MEDQ  D:  06/09/2005  T:  06/10/2005  Job:  [id number]   cc:   Stacie Glaze, [name] [facility]  [address] [place]  [place] [address]

## 2010-08-07 NOTE — H&P (Signed)
NAME:  [name], [name] NO.:  [id number]   MEDICAL RECORD NO.:  [id number]          PATIENT TYPE:  INP   LOCATION:  3102                         FACILITY:  [facility]   PHYSICIAN:  Thomos Lemons, D.O. [facility]   DATE OF BIRTH:  [birth year]   DATE OF ADMISSION:  06/06/2005  DATE OF DISCHARGE:                                HISTORY & PHYSICAL   CHIEF COMPLAINT:  Seizures.   HISTORY OF PRESENT ILLNESS:  Patient is a 70 year old African-American male  with past medical history of alcohol abuse with a recent left CVA in October  of 2006 who presents with seizure.  Patient is accompanied by girlfriend and  sister.  Most of the history is as per girlfriend who lives with patient.  This morning he got up to make some hot chocolate.  He went back to the  bedroom.  Girlfriend heard a thud, found him on the floor unresponsive with  shaking movements of his right hand and arm.  Patient's stroke was evaluated  in [place], [place].  Unclear details.  Medical records not  available.  Patient reportedly had some mild right-sided weakness.  Regained  strength.  Had report of some seizure activity and was on seizure medication  unknown type, dose, but was discontinued approximately one month ago.  Patient had significant alcohol abuse history and previous admissions noted  that he was drinking a fourth gallon of vodka on a daily basis.  Girlfriend  notes that he did have some withdrawal symptoms when he was hospitalized in  [place] during his seizure but reports complete abstinence since that  time.   Patient reported usual state of health before onset of symptoms this  morning.  There is history of chronic urinary incontinence.  Patient's  girlfriend did not notice bowel or bladder incontinence nor report of tongue  biting.   PAST MEDICAL HISTORY:  1.  History of alcohol abuse.  2.  Left CVA, possible hemorrhagic.  3.  Presumed dementia.   FAMILY HISTORY:  Mother deceased at  age 76, had a history of alcoholism,  hypertension.  Father also deceased in his 34s.  Sister has hypertension and  dyslipidemia.   SOCIAL HISTORY:  Patient is divorced.  Has one daughter in [place] who is  on her way to [place].  He is a retired Paramedic.  Lives with his  girlfriend.  Remote history of tobacco 18 years ago.  Alcohol history as  above.   REVIEW OF SYSTEMS:  Unobtainable.   LABORATORY DATA:  CBC:  WBC 8.5, H&H 13.2/9.6, platelets 249.  INR was 1.1.  Sodium 140, potassium 3.8, chloride 105, CO2 16, BUN 15, creatinine 1, blood  sugar 119.  Bilirubin slightly elevated 1.4, alkaline phosphatase 125.  Otherwise, other LFTs were normal.  UA showed small amount of protein,  otherwise negative.  Anion gap is calculated at 19.   MEDICATIONS:  1.  Aricept 5 mg.  2.  Allegra 180.  3.  Levitra p.r.n.   ALLERGIES:  None known.   CAT scan obtained in the ER reported by ER physician as  negative.   PHYSICAL EXAMINATION:  VITAL SIGNS:  Temperature 97.8, pulse 120,  respirations 22, blood pressure 139/72.  GENERAL:  The patient is a 70 year old African-American male somnolent,  arousable with verbal stimuli.  Patient may have right visual field neglect.  HEENT:  Normocephalic, atraumatic.  No sign of tongue biting.  Mucous  membranes were moist.  NECK:  Supple.  No adenopathy, carotid bruit, or thyromegaly.  LUNGS:  Mild strider from upper airway.  Otherwise, lungs were clear.  CARDIOVASCULAR:  Regular rate and rhythm.  Tachycardia.  No significant  murmurs, rubs, or gallops appreciated.  ABDOMEN:  Soft, nontender.  Positive bowel sounds.  EXTREMITIES:  No clubbing, cyanosis, edema.  Patient had somewhat cool lower  extremities.   IMPRESSION AND PLAN:  1.  Seizure, complex partial seizure.  2.  Recent cerebrovascular accident, possibly hemorrhagic.  3.  History of alcohol abuse.  4.  Anion gap acidosis.   RECOMMENDATIONS:  Patient will be admitted to ICU for  further observation.  Patient was seen by neurology, loaded with phenytoin.  MRI has been ordered  to rule out new CVA.  We will monitor the patient closely with aspiration  precautions.   As far as his anion gap acidosis he will check a serum alcohol level,  salicylate, lactic acid, and acetone and also check urine drug screen.      Thomos Lemons, D.O. [facility]  Electronically Signed     RY/MEDQ  D:  06/06/2005  T:  06/07/2005  Job:  [id number]   cc:   Stacie Glaze, [name] [facility]  [address] [place]  [place] [address]

## 2010-08-07 NOTE — Discharge Summary (Signed)
[name], MCCORKEL NO.:  [id number]   MEDICAL RECORD NO.:  [id number]          PATIENT TYPE:  INP   LOCATION:  4729                         FACILITY:  [facility]   PHYSICIAN:  Thomos Lemons, D.O. [facility]   DATE OF BIRTH:  1940-10-13   DATE OF ADMISSION:  07/03/2005  DATE OF DISCHARGE:  07/08/2005                                 DISCHARGE SUMMARY   DISCHARGE DIAGNOSES:  1.  Postictal aphasia.  2.  History of insular stroke at right hemiparesis.  3.  Dementia.  4.  History of alcohol abuse.   DISCHARGE MEDICATIONS:  1.  Dilantin 300 mg at bedtime.  2.  Aspirin 325 mg once daily.  3.  Claritin 10 mg once daily as needed.  4.  Keppra 500 mg twice daily.   Followup instructions was given.  Followup instructions to primary care  physician, Dr. Darryll Capers on April 26, 3:30 p.m. and also [facility] at approximately 1 week after discharge.   HOSPITAL COURSE:  The patient is a 70 year old African-American male with  past medical history of hemorrhagic CVA, who was admitted for probable  seizure activity in March of 2007.  The patient admitted with confusion and  aphasia.  The patient did not answer questions appropriately.   The patient's mental status change was felt to be secondary to possible  postictal state versus new stroke.  MRI of the brain was ordered.  The  patient was sent to see me in consultation by Dr. Kelli Hope of  [facility].  The patient's MRI did not show any new CVA.  It showed chronic left hemorrhage, no acute infarct.  The patient underwent EEG.  It showed left temporoparietal  abnormality and  was started on Keppra 500 mg b.i.d.   HOSPITAL COURSE:  1.  Change in mental status secondary to postictal state.  The patient had      no further seizure activity, and his mental status returned to baseline.      The patient had EEG which showed some epileptic form discharges, but      much improved from April  16.  2.  History of dementia.  The patient was discontinued on Aricept at      discharge, due to the possibility that Aricept may be lowering his      seizure threshold.   CONDITION ON DISCHARGE:  The patient's mental status returned to baseline  and was felt stable for discharge.      Thomos Lemons, D.O. [facility]  Electronically Signed     RY/MEDQ  D:  10/11/2005  T:  10/11/2005  Job:  [id number]   cc:   Stacie Glaze, [name] [facility]

## 2010-08-07 NOTE — Discharge Summary (Signed)
NAMEHAJIME, ASFAW NO.:  [id number]   MEDICAL RECORD NO.:  [id number]          PATIENT TYPE:  IPS   LOCATION:  0506                          [facility]ACILITY[facility]  [facility]   PHYSICIAN:  Jeanice Lim, [name] DATE OF BIRTH:  May 14, 1940   DATE OF ADMISSION:  11/24/2004  DATE OF DISCHARGE:  11/24/2004                                 DISCHARGE SUMMARY   IDENTIFYING DATA:  The patient was not in the hospital long enough to be  evaluated for psychiatric evaluation.  The patient had been admitted for  possible detox.  It was really unclear whether the patient was motivated to  do this and the patient had been determined not dangerous to self or others  and did not require psych hospitalization other than possible need for detox  and then rehab.  He initially refused treatment.  After discussion with  daughter and girlfriend he agreed to inpatient detox and was admitted after  being seen by internal medicine and found to be incontinent, in a  wheelchair.  Abrasions from fall from 3 days ago, disheveled, soiled  clothes, had been falling at home as well as drinking and it appeared very  clear that the patient was not medically appropriate for this facility and  therefore the patient was sent back to the emergency room for evaluation and  admission for medical detox and medical evaluation as indicated, therefore  admitting diagnoses:   ADMISSION DIAGNOSES:  AXIS I:  the patient did not have a formal psychiatric  evaluation, however it was alcohol dependence and possible delirium, not  eating, not drinking, falls, incontinent.  AXIS II:  Deferred.  AXIS III:  Unknown, but multiple concerns.  AXIS IV:  Moderate.  AXIS V:  30/40.   The patient was discharged with the same diagnoses, requiring alcohol  withdrawal but medical stabilization and a medical withdrawal in a medical  setting was appropriate for this patient.  Discharge condition was  unchanged.      Jeanice Lim,  [name]  Electronically Signed     JEM/MEDQ  D:  01/10/2005  T:  01/11/2005  Job:  [id number]

## 2010-08-07 NOTE — H&P (Signed)
NAME:  [name], PROVINCE NO.:  [id number]   MEDICAL RECORD NO.:  [id number]          PATIENT TYPE:  EMS   LOCATION:  ED                           FACILITY:  [facility]   PHYSICIAN:  Thomos Lemons, D.O. [facility]   DATE OF BIRTH:  [birth year]   DATE OF ADMISSION:  07/03/2005  DATE OF DISCHARGE:                                HISTORY & PHYSICAL   PRIMARY CARE PHYSICIAN:  Dr. Darryll Capers.   CHIEF COMPLAINT:  Confusion/mental status change.   HISTORY OF PRESENT ILLNESS:  The patient is a 70 year old African-American  male with past medical history of hemorrhagic CVA in October, 2006, who was  recently admitted for probable seizure in March of 2007. He returns today  for confusion. The patient's daughter called patient at around 2 p.m. and he  did not answer questions appropriately. The daughter then contacted  patient's girlfriend who found the patient at home not making sense,  acting strangely.   After the last discharge from [facility] on June 09, 2005 he had  been doing fairly well. The patient saw Dr. Lovell Sheehan in follow up, unclear if  a repeat phenytoin level was drawn at that time. However patient did not  have any further episodes after hospitalization until this evening. The  patient's girlfriend did not find any evidence of urinary incontinence or  tongue biting and his bedroom was in disarray which is uncommon for the  patient.   PAST MEDICAL HISTORY:  1.  Hemorrhagic CVA, October, 2006.  2.  Probable complex partial seizure with secondary generalization.  3.  History of dementia.  4.  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 History of alcohol      abuse in the past. No tobacco.   FAMILY HISTORY:  Mother deceased at age 40, had history of alcoholism. There  is hypertension and high cholesterol in the family.   REVIEW OF SYSTEMS:  Unobtainable.   LABORATORY DATA:  The only lab data available at the time of dictation was  phenytoin level  which was low at 4.9.  CT of head in ER did not show any acute findings.   PHYSICAL EXAMINATION:  VITAL SIGNS:  Temperature 98.6, pulse is 102,  respirations 20, blood pressure is 129/74. Saturating 98% on room air.  GENERAL:  The patient is a 70 year old African-American male, awake, does  not follow commands appropriately. No slurred speech but answers  inappropriately.  HEENT:  Normocephalic, atraumatic. Pupils were equal, round and reactive to  light bilaterally. Extraocular movements are intact. Patient was anicteric.  Patient had somewhat injected conjunctivae. Mucous membranes are moist.  NECK:  Supple, no adenopathy, carotid bruit or thyromegaly.  CHEST:  The patient was not able to inhale deeply but was clear to  auscultation with normal shallow breaths.  CARDIOVASCULAR:  Regular rate and rhythm, no significant murmurs, rubs, or  gallops appreciated.  ABDOMEN:  Soft, nontender, positive bowel sounds, no organomegaly.  MUSCULOSKELETAL:  No clubbing, cyanosis or edema. Patient had intact pulses.  NEUROLOGIC EXAM:  Limited. Patient had slightly increased muscle tone in  upper extremity and lower extremity. Reflexes are +2 patellar. No Babinski.   IMPRESSION/RECOMMENDATIONS:  1.  Mental status change in a 70 year old African-American male with history      of hemorrhagic CVA and partial complex seizures.  2.  History of dementia.  3.  History of alcohol abuse.   RECOMMENDATIONS:  Suspect patient is having seizure activity versus new  stroke. Discussed the case with neurologist, Dr. Thad Ranger. We will repeat  MRI and load the patient with fosphenytoin1 gram IV. Will check UDS, alcohol  level and liver function tests and CBC. Check a chest x-ray. Will defer  repeat EEG to neurology.   I discussed with patient's girlfriend patient may need Home Health care or  possible short term rehab.      Thomos Lemons, D.O. [facility]  Electronically Signed     RY/MEDQ  D:  07/03/2005  T:   07/03/2005  Job:  [id number]   cc:   Stacie Glaze, [name] [facility]  [address] [place]  [place] [address]

## 2010-08-07 NOTE — Discharge Summary (Signed)
NAMETRAMAIN, GERSHMAN NO.:  [id number]   MEDICAL RECORD NO.:  [id number]          PATIENT TYPE:  INP   LOCATION:  6714                         FACILITY:  [facility]   PHYSICIAN:  Sherin Quarry, MD      DATE OF BIRTH:  1940/12/07   DATE OF ADMISSION:  11/25/2004  DATE OF DISCHARGE:  11/29/2004                                 DISCHARGE SUMMARY   HISTORY:  [name] is a 70 year old man who was initially brought to  the emergency room by his daughter and girlfriend because of persistent  nausea, vomiting and a prolonged history of alcohol consumption.  Nausea and  vomiting had been particularly bad for the last 2 days. The patient drinks  something like a fourth to half a gallon of vodka daily, and has been  drinking regularly for about 10 years.  He had been through alcohol detox  about 3 years prior to admission.  He denied any previous history of  seizures or hallucinations.   PHYSICAL EXAMINATION:  At time of admission as described by Dr. Theone Stanley  VITAL SIGNS:  Temperature 98.1, blood pressure 123/82, blood pressure was  127/83 lying and 129/88 sitting, 91/69 standing, with pulse 132.  HEENT:  Within normal limits.  CHEST:  Clear.  CARDIOVASCULAR:  Revealed normal S1-S2 without gross murmurs or gallops.  ABDOMEN:  Benign.  NEUROLOGIC:  The patient was alert and oriented. Cranial nerves II-XII were  intact. There was 5/5 strength.  EXTREMITIES:  Revealed no evidence of cyanosis or edema.   RELEVANT LABORATORY STUDIES:  (Obtained at the time of admission)  included:  CBC which showed a white count of 6400, hemoglobin 11.3.  BMET:  Sodium 138,  potassium 3.4.  Urine drug screen was negative.  There was no alcohol in the  blood at the time of admission.  TSH was normal.  CT scan of the head was  performed, which was negative.   HOSPITAL COURSE:  On admission, the patient was placed on Ativan alcohol  withdrawal protocol and thiamine 100 mg daily. He  was placed on IV of normal  saline at  200 cc/hour initially; Cipro 400 mg IV every 12 hours was  initially begun, because of a urinalysis suggesting the presence of pyuria.  However, subsequently urine culture was negative and therefore Cipro was  discontinued on November 27, 2004.  In spite of Ativan alcohol withdrawal  protocol, the patient became increasingly confused and required placement of  restraints.  Confusion persisted September 6 and September.  By September 8  he was more alert. At that time the patient and his family met with a Arts development officer.  [facility] does not have any openings at this point.  The  patient said he was not interested in either [facility] or [facility] as a  treatment center.  Outpatient treatment was discussed.  By September 10 the  patient was alert. He was eating and he was having no nausea and vomiting.  At that point it was felt reasonable to discharge the patient.   DISCHARGE DIAGNOSES:  1.  Chronic alcoholism.  2.  Chronic nausea and vomiting, secondary to #1.  3.  Dehydration.  4.  Delirium tremens, resolved on discharge.   PLAN:  The patient was once again instructed to follow up with outpatient  alcohol treatment program.  Prior to his discharge, I informed the patient  that if he continued to abuse alcohol, it would eventually lead to his death  through either cirrhosis of the liver, hepatic encephalopathy or the  potentially lethal effects of alcohol withdrawal.   CONDITION AT TIME OF DISCHARGE:  Fair.           ______________________________  Sherin Quarry, MD     SY/MEDQ  D:  11/29/2004  T:  11/29/2004  Job:  [id number]

## 2010-08-07 NOTE — Consult Note (Signed)
NAME:  [name], [name]               ACCOUNT NO.:  [id number]   MEDICAL RECORD NO.:  [id number]          PATIENT TYPE:  INP   LOCATION:  4729                         FACILITY:  [facility]   PHYSICIAN:  [name], M.D.DATE OF BIRTH:  February 21, 1941   DATE OF CONSULTATION:  DATE OF DISCHARGE:                                   CONSULTATION   REASON FOR EVALUATION:  Speech disturbance.   HISTORY OF PRESENT ILLNESS:  This is an inpatient consultation evaluation of  this existing [facility] patient, a 70 year old man who  has previously been evaluated in a consultative fashion by Gustavus Messing.  [name], [name]  His medical history is remarkable for a stroke which occurred  in October 2006, at which time he was treated in [place].  According to  his girlfriend at the bedside, this stroke rendered him aphasic and right  hemiparetic, but he recovered from this fairly nicely.  He had a seizure in  the hospital and was placed on Dilantin.  He came back to the [place]  area and ran out of medications, and then presented to the hospital in mid-  March, having had several generalized seizures, and again with a speech  disturbance following the seizures.  He was re-loaded with Dilantin and this  rapidly cleared.  EEG and MRI at the time were unremarkable.  The patient  comes back in today because he has again been found with disturbed speech.  The patient's girlfriend provides the history.  She states that she last  talked to him on Thursday and he seemed to be doing well at that time.  She  states that at his baseline he is very communicative, fairly jovial, and  actually able to look after most of his own affairs.  She is not sure  exactly when he was checked on subsequent to that, although she does report  that his daughter tends to tall him a couple times a day.  His daughter  called him this morning and noticed that he was not speaking well.  She then  went to the house  and found the patient speaking only in short sentences and  confused, but moving all his extremities well.  Out of concern of a possible  near stroke he was brought to the [facility] Emergency Room where CT of  the head was unrevealing.  He was admitted to his primary service and  neurologic consultation is requested.  His Dilantin level this evening is  subtherapeutic and his girlfriend indicates there is a good chance he has  not been taking his medications as prescribed.   PAST MEDICAL HISTORY:  As above.  He also has a history of questionable mild  dementia which is related to previous alcohol abuse.  He has no known  history of hypertension, diabetes or coronary disease.   FAMILY HISTORY:  Hypertension and alcoholism.   SOCIAL HISTORY:  He has been living alone prior to this admission, although  he does have a help mate who is with him sometimes.  He has a daughter who  lives in [place], who  calls him frequently.  He is a retired Engineer, drilling.  He has a remote history of tobacco use.  He was a very heavy  drinker in the past, but has not had anything to drink since his stroke in  October 206.   ALLERGIES:  NO KNOWN DRUG ALLERGIES.   MEDICATIONS:  1.  Aricept.  2.  Dilantin 300 mg q.h.s.   REVIEW OF SYSTEMS:  Unobtainable due to the patient's altered mental status,  according to the stories from bedside, he has not had any recent symptoms.   PHYSICAL EXAMINATION:  VITAL SIGNS:  Temperature 98.1, blood pressure  122/83, pulse 98, respirations 18, O2 sat 98% on room air.  GENERAL:  This is a healthy appearing man, seated in the hospital bed, in no  evident distress.  HEAD:  Cranium is normocephalic and atraumatic.  Oropharynx is benign.  NECK:  Supple without carotid or supraclavicular bruits.  HEART:  Regular rate and rhythm without murmurs.  NEUROLOGIC:  Mental status:  He appears awake and alert.  His speech is very hesitant.  He can only speak in a couple of one  word phrases such as okay and I'm  fine.  He is not able to answer questions or follow commands.  Cranial nerves:  Pupils are equal and reactive.  Extraocular movements are  grossly full and he awaits a threat from both sides.  Face, tongue and  palate move normally and symmetrically.  Motor:  Normal bulk and tone.  He seems to move all his extremities well.  Sensation is intact to painful stimulus in all extremities.  Coordination and gait are deferred.  Reflexes are a little brisk on the  right.  Toes are downgoing bilaterally.   LABORATORY REVIEW:  CBC:  White count 5.7, hemoglobin 11.6, platelets  295,000 with an unremarkable differential.  Chemistries are remarkable for  an elevated glucose of 139, otherwise normal.  Dilantin level was 4.9.  He  had a CT of the head this evening which shows subtle sequelae of a stroke in  the left insula, as well as an old right thalamic lacune, but no acute  findings.   IMPRESSION:  1.  Non-fluent aphasia.  Given the patient's history of previous spells of      aphasia as postictal phenomenon, this likely is due to an unwitnessed      generalized seizure, possibly due to left brain focal seizures with      aphasia as a manifestation.  Need to rule out ongoing focal seizures or      status epilepticus, also need to rule out a new stroke.  2.  History of insular stroke with aphasia and right hemiparesis.  3.  History of alcohol abuse, inactive.  4.  Subtherapeutic Dilantin level, questioned medical compliance.   PLAN:  Would suggest MRI of the brain to exclude a stroke.  Would also  proceed with a Dilantin load and then resuming his regular dose of the  medication.  If he is not improved in a couple of days we will need to check  an EEG to exclude ongoing focal seizure activity.  We will follow with you.  Thank you for the consultation.      Michael L. Thad Ranger, [name]  Electronically Signed    MLR/MEDQ  D:  07/03/2005  T:  07/05/2005  Job:   [id number]   cc:   Stacie Glaze, [name] [facility]  [address] [place]  [place] [address]

## 2010-08-07 NOTE — Procedures (Signed)
CLINICAL HISTORY:  This is a 70 year old male who was admitted for altered  mental status.   MEDICATIONS:  Medication list not provided.   FINDINGS:  The background rhythm is disorganized and consists of mixed 6-7  Hz __________, along with some 8-9 Hz of activity.  There is continuous high  amplitude sharp waves, with a timed spike followed by a slow wave  configuration occurring at a frequency of one per second.  This is seen  throughout the left hemisphere, but most noticeable in the parietal and  temporal regions.  At times there is middle image; similar discharges seen  in the left hemisphere as well, but not to the same degree.  The recording  consists mainly of __________ throughout.  At times they become more  organized and have some spikes seen preceding them.  They do not last for  more than 0.5 to 1 second.  Sleep changes are not achieved in this tracing.  Mild drowsiness is noted during this tracing.  Ample ventilation is not  performed.  Photic stimulation was also not performed.  Dental __________  tracing was 20.3 minutes. Technical component is average.  There are  excessive movement artifacts.  EKG tracing is suboptimal data moments.   IMPRESSION:  This EEG is severely abnormal, due to presence of periodic  lateralized epileptiform discharges, arising from left temporal and parietal  regions mostly.           ______________________________  Sunny Schlein. Pearlean Brownie, MD     ZOX:WRUE  D:  07/05/2005 17:07:13  T:  07/06/2005 11:40:06  Job #:  [id number]

## 2010-12-14 LAB — COMPREHENSIVE METABOLIC PANEL
ALT: 17
AST: 50 — ABNORMAL HIGH
Albumin: 3.5
Albumin: 3.9
Alkaline Phosphatase: 137 — ABNORMAL HIGH
Alkaline Phosphatase: 144 — ABNORMAL HIGH
BUN: 10
BUN: 11
BUN: 11
Calcium: 8.9
Chloride: 103
Chloride: 98
Creatinine, Ser: 0.77
Creatinine, Ser: 0.9
GFR calc non Af Amer: >60
Glucose, Bld: 87
Glucose, Bld: 98
Potassium: 4.5
Total Bilirubin: 0.6
Total Protein: 6.3

## 2010-12-14 LAB — DIFFERENTIAL
Basophils Absolute: 0.1
Basophils Relative: 1
Lymphocytes Relative: 5 — ABNORMAL LOW
Neutro Abs: 10.4 — ABNORMAL HIGH
Neutrophils Relative %: 90 — ABNORMAL HIGH

## 2010-12-14 LAB — CULTURE, BLOOD (ROUTINE X 2): Culture: NO GROWTH

## 2010-12-14 LAB — PHENYTOIN LEVEL, TOTAL
Phenytoin Lvl: 10
Phenytoin Lvl: 10.3
Phenytoin Lvl: 11.1

## 2010-12-14 LAB — CARDIAC PANEL(CRET KIN+CKTOT+MB+TROPI)
CK, MB: 2.2
Troponin I: 0.02
Troponin I: 0.03

## 2010-12-14 LAB — URINALYSIS, ROUTINE W REFLEX MICROSCOPIC
Hgb urine dipstick: NEGATIVE
Nitrite: NEGATIVE
Protein, ur: NEGATIVE
Urobilinogen, UA: 0.2

## 2010-12-14 LAB — CBC
HCT: 37.5 — ABNORMAL LOW
HCT: 38.7 — ABNORMAL LOW
HCT: 39.1
Hemoglobin: 12.6 — ABNORMAL LOW
Hemoglobin: 13.2
MCHC: 33.2
MCHC: 33.7
MCV: 89.2
MCV: 89.3
Platelets: 215
Platelets: 227
RDW: 12.6
WBC: 11.5 — ABNORMAL HIGH
WBC: 11.6 — ABNORMAL HIGH

## 2010-12-14 LAB — APTT
aPTT: 27
aPTT: 31

## 2010-12-14 LAB — LEVETIRACETAM LEVEL: Levetiracetam Lvl: 11.7 ug/mL

## 2010-12-14 LAB — PROTIME-INR
INR: 1.1
Prothrombin Time: 14.3

## 2010-12-18 LAB — URINE CULTURE

## 2010-12-18 LAB — URINE MICROSCOPIC-ADD ON

## 2010-12-18 LAB — COMPREHENSIVE METABOLIC PANEL
AST: 66 — ABNORMAL HIGH
Albumin: 3.9
BUN: 6
Calcium: 9.1
Creatinine, Ser: 0.75
GFR calc Af Amer: >60
Total Bilirubin: 1
Total Protein: 6.2

## 2010-12-18 LAB — URINALYSIS, ROUTINE W REFLEX MICROSCOPIC
Bilirubin Urine: NEGATIVE
Urobilinogen, UA: 0.2
pH: 6

## 2010-12-18 LAB — CBC
MCV: 92.2
Platelets: 244
WBC: 12.1 — ABNORMAL HIGH

## 2010-12-18 LAB — BASIC METABOLIC PANEL
BUN: 5 — ABNORMAL LOW
BUN: 7
Calcium: 9.1
Chloride: 106
Creatinine, Ser: 0.81
GFR calc non Af Amer: >60
GFR calc non Af Amer: >60
Glucose, Bld: 96
Potassium: 3.4 — ABNORMAL LOW
Sodium: 136

## 2010-12-18 LAB — CARDIAC PANEL(CRET KIN+CKTOT+MB+TROPI)
CK, MB: 13.3 — ABNORMAL HIGH
CK, MB: 17.4 — ABNORMAL HIGH
Relative Index: 0.7
Relative Index: 0.7
Relative Index: 1.1
Troponin I: 0.07 — ABNORMAL HIGH

## 2010-12-18 LAB — CK TOTAL AND CKMB (NOT AT ARMC)
CK, MB: 37.1 — ABNORMAL HIGH
Relative Index: 1.5
Total CK: 2468 — ABNORMAL HIGH

## 2010-12-18 LAB — DIFFERENTIAL
Eosinophils Absolute: 0
Lymphocytes Relative: 8 — ABNORMAL LOW
Lymphs Abs: 1
Neutro Abs: 10.1 — ABNORMAL HIGH
Neutrophils Relative %: 84 — ABNORMAL HIGH

## 2010-12-18 LAB — PROTIME-INR
INR: 1.1
Prothrombin Time: 14.9

## 2010-12-18 LAB — TROPONIN I: Troponin I: 0.21 — ABNORMAL HIGH

## 2010-12-18 LAB — ETHANOL: Alcohol, Ethyl (B): <5

## 2010-12-18 LAB — RAPID URINE DRUG SCREEN, HOSP PERFORMED
Barbiturates: NOT DETECTED
Benzodiazepines: POSITIVE — AB

## 2010-12-18 LAB — PHENYTOIN LEVEL, TOTAL: Phenytoin Lvl: 4.8 — ABNORMAL LOW

## 2010-12-18 LAB — LEVETIRACETAM LEVEL: Levetiracetam Lvl: 6.7 not reported

## 2010-12-18 LAB — APTT: aPTT: 29

## 2010-12-31 LAB — BASIC METABOLIC PANEL
BUN: 7
CO2: 31
Chloride: 101
GFR calc non Af Amer: >60
Glucose, Bld: 91
Potassium: 3.4 — ABNORMAL LOW
Sodium: 138

## 2010-12-31 LAB — CBC
HCT: 35.8 — ABNORMAL LOW
Hemoglobin: 11.9 — ABNORMAL LOW
Hemoglobin: 12.6 — ABNORMAL LOW
MCHC: 32.7
MCHC: 33.2
MCV: 88.6
RBC: 4.3
RDW: 12.5
RDW: 12.6

## 2010-12-31 LAB — URINALYSIS, ROUTINE W REFLEX MICROSCOPIC
Bilirubin Urine: NEGATIVE
Ketones, ur: NEGATIVE
Nitrite: NEGATIVE
Specific Gravity, Urine: 1.013
Urobilinogen, UA: 1
pH: 6

## 2010-12-31 LAB — I-STAT 8, (EC8 V) (CONVERTED LAB)
Acid-Base Excess: 4 — ABNORMAL HIGH
Bicarbonate: 30.7 — ABNORMAL HIGH
HCT: 43
Hemoglobin: 14.6
Operator id: 133351
Sodium: 135
TCO2: 32
pCO2, Ven: 53.7 — ABNORMAL HIGH

## 2010-12-31 LAB — CK: Total CK: 180

## 2011-01-04 LAB — I-STAT 8, (EC8 V) (CONVERTED LAB)
Acid-base deficit: 2
BUN: 8
Chloride: 104
Operator id: 272551
Potassium: 3.9
pCO2, Ven: 63.7 — ABNORMAL HIGH
pH, Ven: 7.242 — ABNORMAL LOW

## 2011-01-04 LAB — POCT I-STAT CREATININE: Creatinine, Ser: 0.9

## 2011-01-04 LAB — URINALYSIS, MICROSCOPIC ONLY
Leukocytes, UA: NEGATIVE
Nitrite: NEGATIVE
Protein, ur: NEGATIVE
Specific Gravity, Urine: 1.017
pH: 6

## 2011-01-04 LAB — PHENYTOIN LEVEL, TOTAL: Phenytoin Lvl: 10.6

## 2011-01-04 LAB — DIFFERENTIAL
Basophils Absolute: 0
Basophils Relative: 0
Lymphocytes Relative: 6 — ABNORMAL LOW
Monocytes Absolute: 0.4
Neutro Abs: 7.8 — ABNORMAL HIGH

## 2011-01-04 LAB — CBC
Hemoglobin: 12.5 — ABNORMAL LOW
MCHC: 32.8
Platelets: 253
RDW: 12.7
WBC: 8.8

## 2011-01-04 LAB — URINE CULTURE

## 2011-01-07 LAB — BASIC METABOLIC PANEL
BUN: 4 — ABNORMAL LOW
Chloride: 108
Glucose, Bld: 135 — ABNORMAL HIGH
Potassium: 2.7 — CL

## 2011-01-07 LAB — CBC
HCT: 35.9 — ABNORMAL LOW
MCV: 89.5
Platelets: 217
RDW: 12.4
WBC: 4.5

## 2011-05-21 DIAGNOSIS — I483 Typical atrial flutter: Secondary | ICD-10-CM

## 2011-05-21 HISTORY — DX: Typical atrial flutter: I48.3

## 2011-06-02 ENCOUNTER — Inpatient Hospital Stay (HOSPITAL_COMMUNITY)
Admission: EM | Admit: 2011-06-02 | Discharge: 2011-06-07 | DRG: 064 | Disposition: A | Discharge status: Skilled Nursing Facility | Payer: Medicare Other | Attending: Neurology | Admitting: Neurology

## 2011-06-02 ENCOUNTER — Emergency Department (HOSPITAL_COMMUNITY): Payer: Medicare Other

## 2011-06-02 ENCOUNTER — Encounter (HOSPITAL_COMMUNITY): Payer: Self-pay | Admitting: Neurology

## 2011-06-02 ENCOUNTER — Other Ambulatory Visit: Payer: Self-pay

## 2011-06-02 ENCOUNTER — Inpatient Hospital Stay (HOSPITAL_COMMUNITY): Payer: Medicare Other

## 2011-06-02 DIAGNOSIS — Z7982 Long term (current) use of aspirin: Secondary | ICD-10-CM

## 2011-06-02 DIAGNOSIS — I6529 Occlusion and stenosis of unspecified carotid artery: Secondary | ICD-10-CM | POA: Diagnosis not present

## 2011-06-02 DIAGNOSIS — M19049 Primary osteoarthritis, unspecified hand: Secondary | ICD-10-CM | POA: Diagnosis not present

## 2011-06-02 DIAGNOSIS — Z79899 Other long term (current) drug therapy: Secondary | ICD-10-CM | POA: Diagnosis not present

## 2011-06-02 DIAGNOSIS — M899 Disorder of bone, unspecified: Secondary | ICD-10-CM | POA: Diagnosis not present

## 2011-06-02 DIAGNOSIS — R404 Transient alteration of awareness: Secondary | ICD-10-CM | POA: Diagnosis not present

## 2011-06-02 DIAGNOSIS — R7989 Other specified abnormal findings of blood chemistry: Secondary | ICD-10-CM | POA: Diagnosis present

## 2011-06-02 DIAGNOSIS — Z8673 Personal history of transient ischemic attack (TIA), and cerebral infarction without residual deficits: Secondary | ICD-10-CM

## 2011-06-02 DIAGNOSIS — Z5189 Encounter for other specified aftercare: Secondary | ICD-10-CM | POA: Diagnosis not present

## 2011-06-02 DIAGNOSIS — G8190 Hemiplegia, unspecified affecting unspecified side: Secondary | ICD-10-CM | POA: Diagnosis present

## 2011-06-02 DIAGNOSIS — Z7902 Long term (current) use of antithrombotics/antiplatelets: Secondary | ICD-10-CM | POA: Diagnosis not present

## 2011-06-02 DIAGNOSIS — R569 Unspecified convulsions: Secondary | ICD-10-CM | POA: Diagnosis not present

## 2011-06-02 DIAGNOSIS — I6350 Cerebral infarction due to unspecified occlusion or stenosis of unspecified cerebral artery: Principal | ICD-10-CM | POA: Diagnosis present

## 2011-06-02 DIAGNOSIS — R4701 Aphasia: Secondary | ICD-10-CM | POA: Diagnosis present

## 2011-06-02 DIAGNOSIS — G40401 Other generalized epilepsy and epileptic syndromes, not intractable, with status epilepticus: Secondary | ICD-10-CM | POA: Diagnosis not present

## 2011-06-02 DIAGNOSIS — M949 Disorder of cartilage, unspecified: Secondary | ICD-10-CM | POA: Diagnosis not present

## 2011-06-02 DIAGNOSIS — I4892 Unspecified atrial flutter: Secondary | ICD-10-CM | POA: Diagnosis not present

## 2011-06-02 DIAGNOSIS — R918 Other nonspecific abnormal finding of lung field: Secondary | ICD-10-CM | POA: Diagnosis not present

## 2011-06-02 DIAGNOSIS — K5900 Constipation, unspecified: Secondary | ICD-10-CM | POA: Diagnosis not present

## 2011-06-02 DIAGNOSIS — D72829 Elevated white blood cell count, unspecified: Secondary | ICD-10-CM | POA: Diagnosis present

## 2011-06-02 DIAGNOSIS — G40901 Epilepsy, unspecified, not intractable, with status epilepticus: Secondary | ICD-10-CM | POA: Diagnosis present

## 2011-06-02 DIAGNOSIS — I6330 Cerebral infarction due to thrombosis of unspecified cerebral artery: Secondary | ICD-10-CM | POA: Diagnosis not present

## 2011-06-02 DIAGNOSIS — G40909 Epilepsy, unspecified, not intractable, without status epilepticus: Secondary | ICD-10-CM | POA: Insufficient documentation

## 2011-06-02 DIAGNOSIS — I6789 Other cerebrovascular disease: Secondary | ICD-10-CM | POA: Diagnosis not present

## 2011-06-02 DIAGNOSIS — I69998 Other sequelae following unspecified cerebrovascular disease: Secondary | ICD-10-CM | POA: Diagnosis not present

## 2011-06-02 DIAGNOSIS — F29 Unspecified psychosis not due to a substance or known physiological condition: Secondary | ICD-10-CM | POA: Diagnosis not present

## 2011-06-02 HISTORY — DX: Cerebral infarction, unspecified: I63.9

## 2011-06-02 HISTORY — DX: Unspecified convulsions: R56.9

## 2011-06-02 LAB — URINALYSIS, ROUTINE W REFLEX MICROSCOPIC
Leukocytes, UA: NEGATIVE
Specific Gravity, Urine: 1.023 (ref 1.005–1.030)
Urobilinogen, UA: 0.2 mg/dL (ref 0.0–1.0)

## 2011-06-02 LAB — COMPREHENSIVE METABOLIC PANEL
Albumin: 4.2 g/dL (ref 3.5–5.2)
BUN: 27 mg/dL — ABNORMAL HIGH (ref 6–23)
Chloride: 102 mEq/L (ref 96–112)
Creatinine, Ser: 1.15 mg/dL (ref 0.50–1.35)
GFR calc Af Amer: 72 mL/min — ABNORMAL LOW (ref >90)
Total Bilirubin: 0.5 mg/dL (ref 0.3–1.2)

## 2011-06-02 LAB — PHENYTOIN LEVEL, TOTAL
Phenytoin Lvl: 3.4 ug/mL — ABNORMAL LOW (ref 10.0–20.0)
Phenytoin Lvl: 19.5 ug/mL (ref 10.0–20.0)

## 2011-06-02 LAB — DIFFERENTIAL
Basophils Absolute: 0 10*3/uL (ref 0.0–0.1)
Eosinophils Absolute: 0 10*3/uL (ref 0.0–0.7)
Lymphocytes Relative: 7 % — ABNORMAL LOW (ref 12–46)
Monocytes Relative: 8 % (ref 3–12)
Neutro Abs: 17.8 10*3/uL — ABNORMAL HIGH (ref 1.7–7.7)
Neutrophils Relative %: 85 % — ABNORMAL HIGH (ref 43–77)

## 2011-06-02 LAB — CBC
HCT: 38.1 % — ABNORMAL LOW (ref 39.0–52.0)
MCHC: 33.3 g/dL (ref 30.0–36.0)
Platelets: 242 10*3/uL (ref 150–400)
RDW: 14.4 % (ref 11.5–15.5)
WBC: 21 10*3/uL — ABNORMAL HIGH (ref 4.0–10.5)

## 2011-06-02 LAB — HEPATIC FUNCTION PANEL
ALT: 54 U/L — ABNORMAL HIGH (ref 0–53)
Albumin: 3.5 g/dL (ref 3.5–5.2)
Indirect Bilirubin: 0.6 mg/dL (ref 0.3–0.9)
Total Protein: 6.5 g/dL (ref 6.0–8.3)

## 2011-06-02 LAB — URINE MICROSCOPIC-ADD ON

## 2011-06-02 LAB — MRSA PCR SCREENING: MRSA by PCR: NEGATIVE

## 2011-06-02 MED ORDER — PHENYTOIN SODIUM 50 MG/ML IJ SOLN
100.0000 mg | Freq: Three times a day (TID) | INTRAMUSCULAR | Status: DC
  Administered 2011-06-02 – 2011-06-03 (×3): 100 mg via INTRAVENOUS
  Filled 2011-06-02 (×8): qty 2

## 2011-06-02 MED ORDER — BIOTENE DRY MOUTH MT LIQD
15.0000 mL | Freq: Two times a day (BID) | OROMUCOSAL | Status: DC
  Administered 2011-06-02 – 2011-06-03 (×3): 15 mL via OROMUCOSAL

## 2011-06-02 MED ORDER — SODIUM CHLORIDE 0.9 % IV SOLN
1000.0000 mg | INTRAVENOUS | Status: AC
  Administered 2011-06-02: 1000 mg via INTRAVENOUS
  Filled 2011-06-02: qty 20

## 2011-06-02 MED ORDER — SODIUM CHLORIDE 0.9 % IV SOLN
INTRAVENOUS | Status: DC
  Administered 2011-06-02: 75 mL/h via INTRAVENOUS
  Administered 2011-06-02 – 2011-06-04 (×3): via INTRAVENOUS

## 2011-06-02 MED ORDER — LORAZEPAM 2 MG/ML IJ SOLN
1.0000 mg | Freq: Once | INTRAMUSCULAR | Status: DC
  Filled 2011-06-02: qty 1

## 2011-06-02 MED ORDER — ONDANSETRON HCL 4 MG/2ML IJ SOLN: 4.0000 mg | Freq: Four times a day (QID) | INTRAMUSCULAR | Status: DC | PRN

## 2011-06-02 MED ORDER — ACETAMINOPHEN 650 MG RE SUPP: 650.0000 mg | Freq: Four times a day (QID) | RECTAL | Status: DC | PRN

## 2011-06-02 MED ORDER — ASPIRIN 325 MG PO TABS
325.0000 mg | ORAL_TABLET | Freq: Every day | ORAL | Status: DC
  Filled 2011-06-02: qty 1

## 2011-06-02 MED ORDER — SODIUM CHLORIDE 0.9 % IJ SOLN
3.0000 mL | Freq: Two times a day (BID) | INTRAMUSCULAR | Status: DC
  Administered 2011-06-02 – 2011-06-06 (×6): 3 mL via INTRAVENOUS

## 2011-06-02 MED ORDER — SODIUM CHLORIDE 0.9 % IV SOLN
1500.0000 mg | Freq: Two times a day (BID) | INTRAVENOUS | Status: DC
  Administered 2011-06-02 – 2011-06-04 (×5): 1500 mg via INTRAVENOUS
  Filled 2011-06-02 (×8): qty 15

## 2011-06-02 MED ORDER — SODIUM CHLORIDE 0.9 % IJ SOLN
3.0000 mL | Freq: Two times a day (BID) | INTRAMUSCULAR | Status: DC
  Administered 2011-06-02 – 2011-06-07 (×8): 3 mL via INTRAVENOUS

## 2011-06-02 MED ORDER — ASPIRIN 300 MG RE SUPP
300.0000 mg | Freq: Every day | RECTAL | Status: DC
  Administered 2011-06-02 – 2011-06-03 (×2): 300 mg via RECTAL
  Filled 2011-06-02 (×2): qty 1

## 2011-06-02 MED ORDER — ACETAMINOPHEN 325 MG PO TABS
650.0000 mg | ORAL_TABLET | Freq: Four times a day (QID) | ORAL | Status: DC | PRN
  Administered 2011-06-03: 650 mg via ORAL
  Filled 2011-06-02: qty 2

## 2011-06-02 MED ORDER — SODIUM CHLORIDE 0.9 % IV SOLN: 250.0000 mL | INTRAVENOUS | Status: DC | PRN

## 2011-06-02 MED ORDER — ONDANSETRON HCL 4 MG PO TABS: 4.0000 mg | ORAL_TABLET | Freq: Four times a day (QID) | ORAL | Status: DC | PRN

## 2011-06-02 MED ORDER — SODIUM CHLORIDE 0.9 % IJ SOLN: 3.0000 mL | INTRAMUSCULAR | Status: DC | PRN

## 2011-06-02 MED ORDER — ASPIRIN 300 MG RE SUPP: 300.0000 mg | Freq: Every day | RECTAL | Status: DC

## 2011-06-02 MED ORDER — ALUM & MAG HYDROXIDE-SIMETH 200-200-20 MG/5ML PO SUSP: 30.0000 mL | Freq: Four times a day (QID) | ORAL | Status: DC | PRN

## 2011-06-02 MED ORDER — LORAZEPAM 2 MG/ML IJ SOLN
INTRAMUSCULAR | Status: AC
  Filled 2011-06-02: qty 1

## 2011-06-02 MED ORDER — LORAZEPAM 2 MG/ML IJ SOLN
1.0000 mg | Freq: Once | INTRAMUSCULAR | Status: AC
  Administered 2011-06-02: 1 mg via INTRAVENOUS

## 2011-06-02 MED ORDER — DOCUSATE SODIUM 100 MG PO CAPS
100.0000 mg | ORAL_CAPSULE | Freq: Two times a day (BID) | ORAL | Status: DC
Start: 2011-06-02 — End: 2011-06-07
  Administered 2011-06-04 – 2011-06-06 (×3): 100 mg via ORAL
  Filled 2011-06-02 (×7): qty 1

## 2011-06-02 MED ORDER — SODIUM CHLORIDE 0.9 % IV BOLUS (SEPSIS)
250.0000 mL | Freq: Once | INTRAVENOUS | Status: AC
  Administered 2011-06-02: 250 mL via INTRAVENOUS

## 2011-06-02 MED ORDER — LORAZEPAM 2 MG/ML IJ SOLN: 1.0000 mg | Freq: Once | INTRAMUSCULAR | Status: DC

## 2011-06-02 MED ORDER — ASPIRIN-DIPYRIDAMOLE ER 25-200 MG PO CP12
1.0000 | ORAL_CAPSULE | Freq: Two times a day (BID) | ORAL | Status: DC
  Filled 2011-06-02 (×4): qty 1

## 2011-06-02 MED ORDER — CHLORHEXIDINE GLUCONATE 0.12 % MT SOLN
15.0000 mL | Freq: Two times a day (BID) | OROMUCOSAL | Status: DC
  Administered 2011-06-02 – 2011-06-05 (×6): 15 mL via OROMUCOSAL
  Filled 2011-06-02 (×10): qty 15

## 2011-06-02 MED ORDER — ZOLPIDEM TARTRATE 5 MG PO TABS: 5.0000 mg | ORAL_TABLET | Freq: Every evening | ORAL | Status: DC | PRN

## 2011-06-02 NOTE — Progress Notes (Signed)
06/02/2011 Cipriano Mile OTR/L Pager [phone] Office [phone]

## 2011-06-02 NOTE — ED Notes (Signed)
Late entry  Hematoma noted to the right side of his head, cuts to right ear, bruising to the left shoulder and abrasion to right back of shoulder.

## 2011-06-02 NOTE — ED Notes (Signed)
See down time nursing sheet for info prior to this time.

## 2011-06-02 NOTE — Progress Notes (Signed)
PT/OT Cancellation Note  Spoke with RN who request PT/OT hold on evaluations this AM secondary to pt's level of alertness due to medication and pt to soon get EEG. Will attempt later today as time allows. Thanks!  [name] ([name]) Carleene Mains PT, DPT Acute Rehabilitation [phone]

## 2011-06-02 NOTE — ED Notes (Signed)
Attempted to call report but the nurse is with another patient

## 2011-06-02 NOTE — Progress Notes (Signed)
MEDICATION RELATED CONSULT NOTE - INITIAL   Pharmacy Consult for Dilantin Indication: acute seizure and h/o sz  No Known Allergies  Patient Measurements: Height: 6\' 1"  (185.4 cm) Weight: 174 lb 13.2 oz (79.3 kg) IBW/kg (Calculated) : 79.9   Vital Signs: Temp: 98.8 F (37.1 C) (03/13 1245) Temp src: Other (Comment) (03/13 1200) BP: 103/53 mmHg (03/13 1245) Pulse Rate: 99  (03/13 1245) Intake/Output from previous day: 03/12 0701 - 03/13 0700 In: 875 [I.V.:875] Out: 330 [Urine:330] Intake/Output from this shift: Total I/O In: 627 [I.V.:375; IV Piggyback:252] Out: 430 [Urine:430]  Labs:  [facility] 06/02/11 0905 06/02/11 0223 06/02/11 0215  WBC -- -- 21.0*  HGB -- -- 12.7*  HCT -- -- 38.1*  PLT -- -- 242  APTT 26 -- --  CREATININE -- 1.15 --  LABCREA -- -- --  CREATININE -- 1.15 --  [id number] -- -- --  MG -- -- --  PHOS -- -- --  ALBUMIN -- 4.2 --  PROT -- 7.2 --  ALBUMIN -- 4.2 --  AST -- 80* --  ALT -- 27 --  ALKPHOS -- 121* --  BILITOT -- 0.5 --  BILIDIR -- -- --  IBILI -- -- --   Estimated Creatinine Clearance: 65.1 ml/min (by C-G formula based on Cr of 1.15).   Microbiology: Recent Results (from the past 720 hour(s))  MRSA PCR SCREENING     Status: Normal   Collection Time   06/02/11  6:16 AM      Component Value Range Status Comment   MRSA by PCR NEGATIVE  NEGATIVE  Final     Medical History: Past Medical History  Diagnosis Date  . Seizures   . Stroke     Medications:  Prescriptions prior to admission  Medication Sig Dispense Refill  . aspirin 325 MG tablet Take 325 mg by mouth daily.      Marland Kitchen dipyridamole-aspirin (AGGRENOX) 25-200 MG per 12 hr capsule Take 1 capsule by mouth 2 (two) times daily.      Marland Kitchen levETIRAcetam (KEPPRA) 500 MG tablet Take 1,500 mg by mouth every 12 (twelve) hours.      . phenytoin (DILANTIN) 100 MG ER capsule Take 300 mg by mouth at bedtime.      . midazolam (VERSED) 10 MG/2ML SOLN injection Inject 10 mg into the vein  once.        Assessment: 71 y/o male patient admitted with breakthrough seizure d/t subtherapeutic dilantin level. Given 1000mg  IV in ED then started on 100mg  IVq8h. Dosed appropriately.  Goal of Therapy:  Dilantin 10-20  Plan:  Continue dilantin 100mg  IV q8h, follow am labs.  Verlene Mayer, PharmD, BCPS 06/02/2011,12:53 PM

## 2011-06-02 NOTE — ED Provider Notes (Signed)
History     CSN: [id number]  Arrival date & time 06/02/11  0031   None     No chief complaint on file.    HPI Patient has known seizure history was found by his sister tonight having active seizures.  The sister is unsure how long this been going on.  She states he is normally compliant with his medications.  He does live by himself and is self-sufficient.  Patient has had history of stroke in the past and his seizures started after his initial stroke.  According to the sister he takes Keppra and Dilantin to control seizures.  The sister is not aware of the patient having fever or other recent illness. Past Medical History  Diagnosis Date  . Seizures   . Stroke     History reviewed. No pertinent past surgical history.  History reviewed. No pertinent family history.  History  Substance Use Topics  . Smoking status: Never Smoker   . Smokeless tobacco: Never Used  . Alcohol Use: No     Quit greater than 5 years ago      Review of Systems  Unable to perform ROS: Other    Allergies  Review of patient's allergies indicates no known allergies.  Home Medications   No current outpatient prescriptions on file.  BP 93/62  Pulse 91  Temp(Src) 100.4 F (38 C) (Other (Comment))  Resp 17  Ht 6\' 1"  (1.854 m)  Wt 174 lb 13.2 oz (79.3 kg)  BMI 23.07 kg/m2  SpO2 99%  Physical Exam  Nursing note and vitals reviewed. Constitutional: He appears well-developed and well-nourished. No distress.  HENT:  Head: Normocephalic and atraumatic.  Eyes: Pupils are equal, round, and reactive to light.  Neck: Neck supple.  Cardiovascular: Normal rate and intact distal pulses.   Pulmonary/Chest: No respiratory distress. He has no wheezes. He has no rales.  Abdominal: Normal appearance. He exhibits no distension. There is no rebound and no guarding.  Musculoskeletal: He exhibits edema and tenderness.  Neurological: He has normal reflexes. GCS eye subscore is 4. GCS verbal subscore is 5.  GCS motor subscore is 6.  Skin: Skin is warm and dry. No rash noted.    ED Course  Procedures (including critical care time) CRITICAL CARE Performed by: Nelva Nay L   Total critical care time: 30 min  Critical care time was exclusive of separately billable procedures and treating other patients.  Critical care was necessary to treat or prevent imminent or life-threatening deterioration.  Critical care was time spent personally by me on the following activities: development of treatment plan with patient and/or surrogate as well as nursing, discussions with consultants, evaluation of patient's response to treatment, examination of patient, obtaining history from patient or surrogate, ordering and performing treatments and interventions, ordering and review of laboratory studies, ordering and review of radiographic studies, pulse oximetry and re-evaluation of patient's condition.     Labs Reviewed  COMPREHENSIVE METABOLIC PANEL - Abnormal; Notable for the following:    BUN 27 (*)    AST 80 (*)    Alkaline Phosphatase 121 (*)    GFR calc non Af Amer 62 (*)    GFR calc Af Amer 72 (*)    All other components within normal limits  URINALYSIS, ROUTINE W REFLEX MICROSCOPIC - Abnormal; Notable for the following:    Color, Urine AMBER (*) BIOCHEMICALS MAY BE AFFECTED BY COLOR   APPearance CLOUDY (*)    Hgb urine dipstick LARGE (*)  Ketones, ur 15 (*)    Protein, ur 100 (*)    All other components within normal limits  PHENYTOIN LEVEL, TOTAL - Abnormal; Notable for the following:    Phenytoin Lvl 3.4 (*)    All other components within normal limits  CBC - Abnormal; Notable for the following:    WBC 21.0 (*)    Hemoglobin 12.7 (*)    HCT 38.1 (*)    All other components within normal limits  DIFFERENTIAL - Abnormal; Notable for the following:    Neutrophils Relative 85 (*)    Lymphocytes Relative 7 (*)    Neutro Abs 17.8 (*)    Monocytes Absolute 1.7 (*)    All other  components within normal limits  URINE MICROSCOPIC-ADD ON - Abnormal; Notable for the following:    Bacteria, UA MANY (*)    Casts GRANULAR CAST (*) HYALINE CASTS   All other components within normal limits  PROTIME-INR - Abnormal; Notable for the following:    Prothrombin Time 15.4 (*)    All other components within normal limits  HEPATIC FUNCTION PANEL - Abnormal; Notable for the following:    AST 240 (*)    ALT 54 (*)    All other components within normal limits  MRSA PCR SCREENING  PHENYTOIN LEVEL, TOTAL  APTT  CBC  DIFFERENTIAL  APTT  PROTIME-INR  HEPATIC FUNCTION PANEL  ALBUMIN  PHENYTOIN LEVEL, TOTAL   Ct Head Wo Contrast  06/02/2011  *RADIOLOGY REPORT*  Clinical Data: Altered level of consciousness  CT HEAD WITHOUT CONTRAST  Technique:  Contiguous axial images were obtained from the base of the skull through the vertex without contrast.  Comparison: 10/12/2007  Findings: Chronic ischemic changes.  Atrophy.  No mass effect, midline shift, or acute intracranial hemorrhage.  Mild mucosal thickening in the paranasal sinuses.  IMPRESSION: No acute intracranial pathology.  Original Report Authenticated By: Donavan Burnet, [name]   Dg Chest Portable 1 View  06/02/2011  *RADIOLOGY REPORT*  Clinical Data: Fall and seizure  PORTABLE CHEST - 1 VIEW  Technique: Portable  Comparison:  None.  Findings: Patchy airspace opacities at both lung bases left greater than right.  Upper normal heart size.  No pneumothorax.  IMPRESSION: Bibasilar airspace opacities left greater than right.  Original Report Authenticated By: Donavan Burnet, [name]   Dg Hand Complete Right  06/02/2011  *RADIOLOGY REPORT*  Clinical Data: Swelling over the metacarpals, seizure activity.  RIGHT HAND - COMPLETE 3+ VIEW  Comparison: None.  Findings: Osteopenia.  Degenerative changes at the PIP and DIP joints, mild.  No acute fracture or dislocation.  No aggressive osseous lesion identified.  IMPRESSION: Osteopenia and mild  degenerative changes .  No acute osseous abnormality  identified.  Original Report Authenticated By: Waneta Martins, [name]     1. Status epilepticus       MDM   Scheduled Meds:   . antiseptic oral rinse  15 mL Mouth Rinse q12n4p  . aspirin  300 mg Rectal Daily  . chlorhexidine  15 mL Mouth Rinse BID  . dipyridamole-aspirin  1 capsule Oral BID  . docusate sodium  100 mg Oral BID  . levetiracetam  1,500 mg Intravenous BID  . LORazepam      . LORazepam  1 mg Intravenous Once  . LORazepam  1 mg Intravenous Once  . phenytoin (DILANTIN) IV  1,000 mg Intravenous To Major  . phenytoin (DILANTIN) IV  100 mg Intravenous Q8H  . sodium chloride  250 mL Intravenous Once  . sodium chloride  3 mL Intravenous Q12H  . sodium chloride  3 mL Intravenous Q12H  . DISCONTD: aspirin  300 mg Rectal Daily  . DISCONTD: aspirin  325 mg Oral Daily  . DISCONTD: LORazepam  1 mg Intravenous Once   Continuous Infusions:   . sodium chloride 75 mL/hr at 06/02/11 1900   PRN Meds:.sodium chloride, acetaminophen, acetaminophen, alum & mag hydroxide-simeth, ondansetron (ZOFRAN) IV, ondansetron, sodium chloride, zolpidem         Nelia Shi, MD 06/02/11 2245

## 2011-06-02 NOTE — ED Notes (Signed)
Dr Reynolds in to see patient

## 2011-06-02 NOTE — H&P (Signed)
Admission H&P    Chief Complaint: Seizures  HPI: [name] is an 71 y.o. male with history of seizures and stroke that was found today by family with repetitive seizure activity.  It is unclear how long the patient was seizing-he was last spoke to at about 1100 on 3/12 and was found at about 2345.  Patient has continued to exhibit seizure activity while here in the ED.  Patient is maintained on Dilantin and Keppra.  Patient takes his medications religiously.  Followed by a neurologist but has not seen him this year.  His last seizure was in 2009.  Past medical history: Seizure, stroke  No past surgical history on file.  No family history on file.  Social History:  Patient quit drinking about 5 years ago.  Has no history of tobacco or illicit drug abuse.    Allergies: No Known Allergies  Medications Prior to Admission  Medication Dose Route Frequency Provider Last Rate Last Dose  . LORazepam (ATIVAN) 2 MG/ML injection           . LORazepam (ATIVAN) injection 1 mg  1 mg Intravenous Once Thana Farr, MD      . LORazepam (ATIVAN) injection 1 mg  1 mg Intravenous Once Thana Farr, MD      . phenytoin (DILANTIN) 1,000 mg in sodium chloride 0.9 % 250 mL IVPB  1,000 mg Intravenous To Major Thana Farr, MD      . DISCONTD: LORazepam (ATIVAN) injection 1 mg  1 mg Intravenous Once Thana Farr, MD       No current outpatient prescriptions on file as of 06/02/2011.    Home Medications:  Aggrenox, Keppra 1500mg  BID. Dilantin 350mg  qhs, ASA  ROS: Unable to obtain  Physical Examination: BP- 97/66, HR- 106, RR- 18, SpO2- 100%  HEENT-  Normocephalic, no lesions, without obvious abnormality.  Normal external eye and conjunctiva.  Normal TM's bilaterally.  Normal auditory canals and external ears. Normal external nose, mucus membranes and septum.  Normal pharynx. Neck supple with no masses, nodes, nodules or enlargement. Cardiovascular - S1, S2 normal Lungs - chest clear,  no wheezing, rales, normal symmetric air entry Abdomen - soft, non-tender; bowel sounds normal; no masses,  no organomegaly Extremities - no edema.  Bruise on right hand  Neurologic Examination: Mental Status: Lethargic.  No speech.  Patient does not follow commands. Cranial Nerves: II: visual fields unable to test.  Pupils equal, round, reactive to light and accommodation III,IV, VI: ptosis not present, extra-ocular motions grossly intact bilaterally V,VII: decrease in right NLF. VIII: hearing unable to test IX,X: gag reflex present XI: trapezius strength unable to test XII: tongue strength unable to test Motor: Patient unable to maintain right upper extremity and lower extremity against gravity.  Extremities fall to the bed flaccidly.  Patient is able to maintain the left against gravity.  Tone normal. Sensory: Pinprick and light touch unable to test Deep Tendon Reflexes: 1+ throughout with absent AJ's bilaterally Plantars: Right: downgoing   Left: downgoing Cerebellar: Unable to test  Results for orders placed during the hospital encounter of 06/02/11 (from the past 48 hour(s))  CBC     Status: Abnormal   Collection Time   06/02/11  2:15 AM      Component Value Range Comment   WBC 21.0 (*) 4.0 - 10.5 (K/uL)    RBC 4.26  4.22 - 5.81 (MIL/uL)    Hemoglobin 12.7 (*) 13.0 - 17.0 (g/dL)    HCT 16.1 (*) 09.6 -  52.0 (%)    MCV 89.4  78.0 - 100.0 (fL)    MCH 29.8  26.0 - 34.0 (pg)    MCHC 33.3  30.0 - 36.0 (g/dL)    RDW 78.2  95.6 - 21.3 (%)    Platelets 242  150 - 400 (K/uL)   DIFFERENTIAL     Status: Abnormal   Collection Time   06/02/11  2:15 AM      Component Value Range Comment   Neutrophils Relative 85 (*) 43 - 77 (%)    Lymphocytes Relative 7 (*) 12 - 46 (%)    Monocytes Relative 8  3 - 12 (%)    Eosinophils Relative 0  0 - 5 (%)    Basophils Relative 0  0 - 1 (%)    Neutro Abs 17.8 (*) 1.7 - 7.7 (K/uL)    Lymphs Abs 1.5  0.7 - 4.0 (K/uL)    Monocytes Absolute 1.7 (*)  0.1 - 1.0 (K/uL)    Eosinophils Absolute 0.0  0.0 - 0.7 (K/uL)    Basophils Absolute 0.0  0.0 - 0.1 (K/uL)    Smear Review MORPHOLOGY UNREMARKABLE     COMPREHENSIVE METABOLIC PANEL     Status: Abnormal   Collection Time   06/02/11  2:23 AM      Component Value Range Comment   Sodium 144  135 - 145 (mEq/L)    Potassium 4.1  3.5 - 5.1 (mEq/L)    Chloride 102  96 - 112 (mEq/L)    CO2 20  19 - 32 (mEq/L)    Glucose, Bld 80  70 - 99 (mg/dL)    BUN 27 (*) 6 - 23 (mg/dL)    Creatinine, Ser 0.86  0.50 - 1.35 (mg/dL)    Calcium 9.2  8.4 - 10.5 (mg/dL)    Total Protein 7.2  6.0 - 8.3 (g/dL)    Albumin 4.2  3.5 - 5.2 (g/dL)    AST 80 (*) 0 - 37 (U/L)    ALT 27  0 - 53 (U/L)    Alkaline Phosphatase 121 (*) 39 - 117 (U/L)    Total Bilirubin 0.5  0.3 - 1.2 (mg/dL)    GFR calc non Af Amer 62 (*) >90 (mL/min)    GFR calc Af Amer 72 (*) >90 (mL/min)   PHENYTOIN LEVEL, TOTAL     Status: Abnormal   Collection Time   06/02/11  2:23 AM      Component Value Range Comment   Phenytoin Lvl 3.4 (*) 10.0 - 20.0 (ug/mL)    Ct Head Wo Contrast  06/02/2011  *RADIOLOGY REPORT*  Clinical Data: Altered level of consciousness  CT HEAD WITHOUT CONTRAST  Technique:  Contiguous axial images were obtained from the base of the skull through the vertex without contrast.  Comparison: 10/12/2007  Findings: Chronic ischemic changes.  Atrophy.  No mass effect, midline shift, or acute intracranial hemorrhage.  Mild mucosal thickening in the paranasal sinuses.  IMPRESSION: No acute intracranial pathology.  Original Report Authenticated By: Donavan Burnet, [name]    Assessment/Plan  Patient Active Hospital Problem List: Status Epilepticus   Assessment: Patient continued to have seizure activity while in ED requiring 2mg  of Ativan.  Dilantin level low at 3.4.  Likely cause for breakthrough seizures.     Plan: 1.  Load with 1000mg  of Dilantin IV.  Level 2 hours post load.  2.  Maintenance Dilantin of 100mg  IV q 8hours  3.   Continue Keppra at 1500mg  IV  q 12hours  4.  Patient to be admitted to 3100 with seizure precautions  5.  EEG Right sided weakness   Assessment:  Patient had no residual from previous stroke.  Right sided weakness may be a Todd's paralysis related to prolonged seizures but will rule out recurrent stroke.  CT shows no acute changes.   Plan: 1.  MRI of the brain without contrast  2.  PT/OT consults Elevated LFT's   Assessment:  Family reports that the patient does not drink.  Questionably related to Dilantin.  Will check records from outpatient neurologist.  Patient may not be able to continue on Dilantin.   Plan:  1.  Release to be signed for outpatient records Elevated wbc count   Assessment:  WBC count may be related to seizure activity but due to the prolonged nature of the seizure activity can not rule out aspiration.     Plan 1.  Chest x-ray pending  2.  Repeat CBC in AM  Thana Farr, MD [facility] [phone] 06/02/2011, 3:23 AM

## 2011-06-02 NOTE — Progress Notes (Signed)
[facility] PROGRESS NOTE    SUBJECTIVE   Patient alert, able to mimic commands but unable to verbally talk in coherent language or follow verbal commands.  Currently obtaining EEG  OBJECTIVE   Vital signs in last 24 hours: Temp:  [99.9 F (37.7 C)-100.6 F (38.1 C)] 100.2 F (37.9 C) (03/13 0800) Pulse Rate:  [96-106] 100  (03/13 0800) Resp:  [17-24] 23  (03/13 0800) BP: (87-115)/(59-81) 100/75 mmHg (03/13 0800) SpO2:  [94 %-100 %] 96 % (03/13 0800) Weight:  [79.3 kg (174 lb 13.2 oz)] 79.3 kg (174 lb 13.2 oz) (03/13 0515)  Intake/Output from previous day: 03/12 0701 - 03/13 0700 In: 875 [I.V.:875] Out: 330 [Urine:330] Intake/Output this shift: Total I/O In: 75 [I.V.:75] Out: 215 [Urine:215] Nutritional status: NPO  Past Medical History  Diagnosis Date  . Seizures   . Stroke     Neurologic Exam:  Mental Status: Alert, able to mimic commands but unable to verbally talk in coherent language or follow verbal commands Cranial Nerves: II-Visual fields grossly intact. III/IV/VI-Extraocular movements intact.  Pupils reactive bilaterally. V/VII-Smile asymmetric with right facial droop VIII-grossly intact IX/X-normal gag XI-bilateral shoulder shrug XII-midline tongue extension Motor: 5/5 Left UE and LE  And  Right upper and lower extremity shows 4/5 strength with drift both and leg. normal tone and bulk Sensory: responds to noxious stimuli bilaterally Deep Tendon Reflexes: 2+ and symmetric throughout Plantars: Downgoing bilaterally Cerebellar: Normal finger-to-nose,  Lab Results: No results found for this basename: cbc, bmp, coags, chol, tri, ldl, hga1c   Lipid Panel No results found for this basename: CHOL,TRIG,HDL,CHOLHDL,VLDL,LDLCALC in the last 72 hours  Studies/Results: Ct Head Wo Contrast  06/02/2011  *RADIOLOGY REPORT*  Clinical Data: Altered level of consciousness  CT HEAD WITHOUT CONTRAST  Technique:   Contiguous axial images were obtained from the base of the skull through the vertex without contrast.  Comparison: 10/12/2007  Findings: Chronic ischemic changes.  Atrophy.  No mass effect, midline shift, or acute intracranial hemorrhage.  Mild mucosal thickening in the paranasal sinuses.  IMPRESSION: No acute intracranial pathology.  Original Report Authenticated By: Donavan Burnet, [name]   Dg Hand Complete Right  06/02/2011  *RADIOLOGY REPORT*  Clinical Data: Swelling over the metacarpals, seizure activity.  RIGHT HAND - COMPLETE 3+ VIEW  Comparison: None.  Findings: Osteopenia.  Degenerative changes at the PIP and DIP joints, mild.  No acute fracture or dislocation.  No aggressive osseous lesion identified.  IMPRESSION: Osteopenia and mild degenerative changes .  No acute osseous abnormality  identified.  Original Report Authenticated By: Waneta Martins, [name]    Medications:     Scheduled:   . aspirin  325 mg Oral Daily  . dipyridamole-aspirin  1 capsule Oral BID  . docusate sodium  100 mg Oral BID  . levetiracetam  1,500 mg Intravenous BID  . LORazepam      . LORazepam  1 mg Intravenous Once  . LORazepam  1 mg Intravenous Once  . phenytoin (DILANTIN) IV  1,000 mg Intravenous To Major  . phenytoin (DILANTIN) IV  100 mg Intravenous Q8H  . sodium chloride  3 mL Intravenous Q12H  . sodium chloride  3 mL Intravenous Q12H  . DISCONTD: aspirin  300 mg Rectal Daily  . DISCONTD: LORazepam  1 mg Intravenous Once    Assessment/Plan:    Patient Active Hospital Problem List:  SEIZURE DISORDER (09/15/2006)   Assessment: no current seizure--Dilantin level 19.5   Plan: will repeat LFT, albumin and dilantin in AM  Right sided weakness  Assessment: Patient had no residual from previous stroke. Right sided weakness may be a Todd's paralysis related to prolonged seizures but will rule out recurrent stroke. CT shows no acute changes.  Plan: 1. MRI of the brain without contrast  PENDING 2. PT/OT  consults  Elevated LFT's  Assessment: Family reports that the patient does not drink. Questionably related to Dilantin. Will check records from outpatient neurologist. Patient may not be able to continue on Dilantin.  Plan: 1. Release to be signed for outpatient records Will reorder LFT for AM  Elevated wbc count  Assessment: WBC count may be related to seizure activity but due to the prolonged nature of the seizure activity can not rule out aspiration.  Plan 1. Chest x-ray pending  2. Repeat CBC in AM      Felicie Morn PA-C [facility] [phone]  06/02/2011, 9:47 AM

## 2011-06-02 NOTE — Evaluation (Signed)
Clinical/Bedside Swallow Evaluation Patient Details  Name: [name] MRN: [id number] DOB: 1940-09-27 Today's Date: 06/02/2011  Past Medical History:  Past Medical History  Diagnosis Date  . Seizures   . Stroke    Past Surgical History: History reviewed. No pertinent past surgical history. HPI:  71 y.o. male with history of seizures and stroke found by family with repetitive seizure activity.  CCT reveals no acute intracranial abnormality.  Pt with impaired speech/language s/p seizure,  MRI pending.  Failed stroke swallow screen, therefore clinical swallow eval ordered.     Assessment/Recommendations/Treatment Plan   Clinical Impression: Pt presents with an apraxic swallow at this time, with impaired coordination of oral preparatory stage, oral holding, effortful/repeated swallows with single boluses, wet vocal quality.  Pt with impaired speech, difficulty following commands, and confusion, impacting overall swallow safety.  Recommend maintaining NPO excluding ice chips.  SLP will f/u in a.m  to determine improved readiness for POs.  Discussed with pt's dtr, who is in agreement.  Suspect transient dysphagia with return of functional/safe swallow within a matter of days.  Risk for Aspiration: Moderate  Swallow Evaluation Recommendations General Recommendation: Ice chips PRN after oral care;NPO Oral Care Recommendations: Oral care BID  Treatment Plan Treatment Plan Recommendations: Therapy as outlined in treatment plan below Speech Therapy Frequency: min 2x/week  Swallowing Goals  SLP Swallowing Goals Goal #3: Pt will tolerate PO trials with min assist to maximize safety.  Drayk Humbarger L. Samson Frederic, [place] CCC/SLP Pager [phone]   Blenda Mounts Laurice 06/02/2011,4:15 PM

## 2011-06-02 NOTE — ED Notes (Signed)
This nurse assessed and another nurse attempted times 3 for IV without success.  IV team notified

## 2011-06-03 ENCOUNTER — Inpatient Hospital Stay (HOSPITAL_COMMUNITY): Payer: Medicare Other

## 2011-06-03 DIAGNOSIS — I6789 Other cerebrovascular disease: Secondary | ICD-10-CM | POA: Diagnosis not present

## 2011-06-03 DIAGNOSIS — R569 Unspecified convulsions: Secondary | ICD-10-CM | POA: Diagnosis not present

## 2011-06-03 DIAGNOSIS — I6529 Occlusion and stenosis of unspecified carotid artery: Secondary | ICD-10-CM | POA: Diagnosis not present

## 2011-06-03 DIAGNOSIS — I6350 Cerebral infarction due to unspecified occlusion or stenosis of unspecified cerebral artery: Secondary | ICD-10-CM | POA: Diagnosis not present

## 2011-06-03 DIAGNOSIS — F29 Unspecified psychosis not due to a substance or known physiological condition: Secondary | ICD-10-CM | POA: Diagnosis not present

## 2011-06-03 DIAGNOSIS — G40401 Other generalized epilepsy and epileptic syndromes, not intractable, with status epilepticus: Secondary | ICD-10-CM | POA: Diagnosis not present

## 2011-06-03 LAB — ALBUMIN: Albumin: 3.4 g/dL — ABNORMAL LOW (ref 3.5–5.2)

## 2011-06-03 MED ORDER — LABETALOL HCL 5 MG/ML IV SOLN
INTRAVENOUS | Status: AC
  Filled 2011-06-03: qty 4

## 2011-06-03 MED ORDER — SODIUM CHLORIDE 0.9 % IV SOLN
200.0000 mg | Freq: Two times a day (BID) | INTRAVENOUS | Status: DC
  Administered 2011-06-03 – 2011-06-06 (×4): 200 mg via INTRAVENOUS
  Filled 2011-06-03 (×12): qty 4

## 2011-06-03 MED ORDER — GADOBENATE DIMEGLUMINE 529 MG/ML IV SOLN
15.0000 mL | Freq: Once | INTRAVENOUS | Status: AC
  Administered 2011-06-03: 15 mL via INTRAVENOUS

## 2011-06-03 MED ORDER — LABETALOL HCL 5 MG/ML IV SOLN
10.0000 mg | Freq: Once | INTRAVENOUS | Status: AC
  Administered 2011-06-03: 10 mg via INTRAVENOUS

## 2011-06-03 MED ORDER — PHENYTOIN SODIUM 50 MG/ML IJ SOLN
100.0000 mg | Freq: Once | INTRAMUSCULAR | Status: AC
  Administered 2011-06-03: 100 mg via INTRAVENOUS
  Filled 2011-06-03: qty 2

## 2011-06-03 MED ORDER — ASPIRIN-DIPYRIDAMOLE ER 25-200 MG PO CP12
1.0000 | ORAL_CAPSULE | Freq: Two times a day (BID) | ORAL | Status: DC
  Administered 2011-06-04 – 2011-06-07 (×7): 1 via ORAL
  Filled 2011-06-03 (×8): qty 1

## 2011-06-03 NOTE — Progress Notes (Signed)
MEDICATION RELATED CONSULT NOTE - INITIAL   Pharmacy Consult for Dilantin Indication: acute seizure and h/o sz  No Known Allergies  Patient Measurements: Height: 6\' 1"  (185.4 cm) Weight: 174 lb 13.2 oz (79.3 kg) IBW/kg (Calculated) : 79.9   Vital Signs: Temp: 99 F (37.2 C) (03/14 1222) Temp src: Axillary (03/14 1222) BP: 103/66 mmHg (03/14 1222) Pulse Rate: 89  (03/14 1222) Intake/Output from previous day: 03/13 0701 - 03/14 0700 In: 2286 [I.V.:1800; IV Piggyback:486] Out: 1195 [Urine:1195] Intake/Output from this shift: Total I/O In: 150 [I.V.:150] Out: 153 [Urine:153]  Labs:  Basename 06/03/11 0435 06/02/11 1414 06/02/11 0905 06/02/11 0223 06/02/11 0215  WBC -- -- -- -- 21.0*  HGB -- -- -- -- 12.7*  HCT -- -- -- -- 38.1*  PLT -- -- -- -- 242  APTT -- -- 26 -- --  CREATININE -- -- -- 1.15 --  LABCREA -- -- -- -- --  CREATININE -- -- -- 1.15 --  [id number] -- -- -- -- --  MG -- -- -- -- --  PHOS -- -- -- -- --  ALBUMIN 3.4* 3.5 -- 4.2 --  PROT -- 6.5 -- 7.2 --  ALBUMIN 3.4* 3.5 -- 4.2 --  AST -- 240* -- 80* --  ALT -- 54* -- 27 --  ALKPHOS -- 98 -- 121* --  BILITOT -- 0.8 -- 0.5 --  BILIDIR -- 0.2 -- -- --  IBILI -- 0.6 -- -- --   Estimated Creatinine Clearance: 65.1 ml/min (by C-G formula based on Cr of 1.15).   Microbiology: Recent Results (from the past 720 hour(s))  MRSA PCR SCREENING     Status: Normal   Collection Time   06/02/11  6:16 AM      Component Value Range Status Comment   MRSA by PCR NEGATIVE  NEGATIVE  Final     Medical History: Past Medical History  Diagnosis Date  . Seizures   . Stroke     Medications:  Prescriptions prior to admission  Medication Sig Dispense Refill  . aspirin 325 MG tablet Take 325 mg by mouth daily.      Marland Kitchen dipyridamole-aspirin (AGGRENOX) 25-200 MG per 12 hr capsule Take 1 capsule by mouth 2 (two) times daily.      Marland Kitchen levETIRAcetam (KEPPRA) 500 MG tablet Take 1,500 mg by mouth every 12 (twelve) hours.       . phenytoin (DILANTIN) 100 MG ER capsule Take 300 mg by mouth at bedtime.      . midazolam (VERSED) 10 MG/2ML SOLN injection Inject 10 mg into the vein once.        Assessment: 71 y/o male patient admitted with breakthrough seizure d/t subtherapeutic dilantin level. Given 1000mg  IV in ED then started on 100mg  IVq8h. DPH level 19.5 after loading. Level down to 15.3 this AM. Since he was compliance with his home dose and his level was low, I think he needs more in his maintenance dose. Will adjust today.  Goal of Therapy:  Dilantin 10-20  Plan:  Change dilantin to 200mg  IV bid When he can take PO, we can change to 400mg  qday.

## 2011-06-03 NOTE — Progress Notes (Signed)
Chart reviewed, CM will continue to follow for progression and d/c planning, noted recommendation for Inpt rehab vs SNF for rehab. Will assist as needed.  Johny Shock RN MPH Case Manager [phone]

## 2011-06-03 NOTE — Progress Notes (Signed)
Occupational Therapy Evaluation Patient Details Name: [name] MRN: [id number] DOB: 04-23-1940 Today's Date: 06/03/2011  Problem List:  Patient Active Problem List  Diagnoses  . ANEMIA, DEFICIENCY NOS  . DEMENTIA  . ABUSE, ALCOHOL, CONTINUOUS  . ORGANIC BRAIN SYNDROME  . HYPERTENSION  . CVA  . SINUSITIS, CHRONIC NEC  . ALLERGIC RHINITIS  . CIRRHOSIS, ALCOHOLIC, LIVER  . ERECTILE DYSFUNCTION, ORGANIC  . SEIZURE DISORDER  . ABNORMAL RESULT, FUNCTION STUDY, LIVER  . CEREBROVASCULAR ACCIDENT, HX OF  . PANCREATITIS, HX OF  . Status epilepticus    Past Medical History:  Past Medical History  Diagnosis Date  . Seizures   . Stroke    Past Surgical History: History reviewed. No pertinent past surgical history.  OT Assessment/Plan/Recommendation OT Assessment Clinical Impression Statement: Pt admitted for seizure activity with history of stroke and seizures thus affecting PLOF. Pt's last reported seizure in 2009. Will benefit from acute OT services in order to address below problem list in prep for d/c to either CIR or SNF.  OT Recommendation/Assessment: Patient will need skilled OT in the acute care venue OT Problem List: Decreased activity tolerance;Impaired balance (sitting and/or standing);Decreased coordination;Decreased cognition;Decreased safety awareness;Decreased knowledge of use of DME or AE OT Therapy Diagnosis : Generalized weakness;Cognitive deficits;Acute pain OT Plan OT Frequency: Min 2X/week OT Treatment/Interventions: Self-care/ADL training;DME and/or AE instruction;Therapeutic activities;Cognitive remediation/compensation;Patient/family education;Balance training OT Recommendation Recommendations for Other Services: Rehab consult Follow Up Recommendations: Inpatient Rehab;Skilled nursing facility Equipment Recommended: Defer to next venue Individuals Consulted Consulted and Agree with Results and Recommendations: Patient OT Goals Acute Rehab OT  Goals OT Goal Formulation: With patient Time For Goal Achievement: 2 weeks ADL Goals Pt Will Perform Upper Body Bathing: with set-up;Sitting, chair;Sitting, edge of bed ADL Goal: Upper Body Bathing - Progress: Goal set today Pt Will Perform Lower Body Bathing: with set-up;Sitting, edge of bed;Sitting, chair ADL Goal: Lower Body Bathing - Progress: Goal set today Pt Will Transfer to Toilet: with supervision;Stand pivot transfer;with DME;3-in-1 ADL Goal: Toilet Transfer - Progress: Goal set today Miscellaneous OT Goals Miscellaneous OT Goal #1: Pt will perform bed mobility with supervision in prep for ADL activity. OT Goal: Miscellaneous Goal #1 - Progress: Goal set today  OT Evaluation Precautions/Restrictions  Precautions Precautions: Fall Required Braces or Orthoses: No Restrictions Weight Bearing Restrictions: No Prior Functioning Home Living Lives With: Alone Additional Comments: Pt unable to give complete history due to receptive deficits with intial expressive deficits. Prior Function Driving: Yes Comments: Per RN report, pt was living alone and required housecleaning help only.  ADL ADL Upper Body Bathing: Simulated;Moderate assistance Upper Body Bathing Details (indicate cue type and reason): cueing for task initiation and sequencing. increased time for purposeful movement in RUE. Where Assessed - Upper Body Bathing: Sitting, bed Upper Body Dressing: Simulated;Moderate assistance Upper Body Dressing Details (indicate cue type and reason): cueing for task initiation and sequencing. increased time for purposeful movement in RUE Where Assessed - Upper Body Dressing: Sitting, bed Toilet Transfer: Simulated;+2 Total assistance;Comment for patient % (20%) Toilet Transfer Details (indicate cue type and reason): bed to chair. blocking bil. knees for buckling. Toilet Transfer Method: Surveyor, minerals: Other (comment) (chair) ADL Comments: Anticipate pt will  require max assist with LB ADLs. Pt sat EOB ~7 minutes with bil. knees blocked due to increased agitation and impulsive movements.  Vision/Perception    Cognition Cognition Arousal/Alertness: Awake/alert Overall Cognitive Status: Difficult to assess Difficult to assess due to: impaired communication (possibly both receptive  and expressive aphasia) Orientation Level: Oriented to person Cognition - Other Comments: Unable to consistently follow one step commands.  Able to mimic demonstrations of tasks. Sensation/Coordination Sensation Light Touch: Not tested Stereognosis: Impaired by gross assessment Coordination Gross Motor Movements are Fluid and Coordinated: No (R UE) Fine Motor Movements are Fluid and Coordinated: No (RUE) Extremity Assessment RUE Assessment RUE Assessment: Within Functional Limits LUE Assessment LUE Assessment: Within Functional Limits Mobility  Bed Mobility Bed Mobility: Yes Rolling Right: 3: Mod assist;With rail Rolling Right Details (indicate cue type and reason): (A) to initiate rolling and complete rolling with tactile and manual cues for hand placement with (A) with RLE OOB. Right Sidelying to Sit: 4: Min assist;With rails;HOB elevated (comment degrees) (HOB 30 degrees) Right Sidelying to Sit Details (indicate cue type and reason): (A) to elevate trunk OOB with cues for techique. Transfers Transfers: Yes Sit to Stand: 1: +2 Total assist;Patient percentage (comment);From bed (pt 50%) Sit to Stand Details (indicate cue type and reason): (A) to initiate transfer with manual (A) to prevent bilateral knee buckling RLE > LLE.  Pt very impulsive and needs constant cues for technique.  Pt unable to follow commands to stand and needed visual cues and hand cues to intiate transfers. Stand to Sit: 1: +2 Total assist;Patient percentage (comment);To chair/3-in-1 (pt 20%) Stand to Sit Details: (A) to slowly descend to recliner and manual (A) to prevent bilateral knee  buckle and rapid descent.   Exercises   End of Session OT - End of Session Equipment Utilized During Treatment: Gait belt Activity Tolerance: Patient tolerated treatment well Patient left: in chair;with call bell in reach Nurse Communication: Mobility status for transfers General Behavior During Session: Agitated Cognition: Impaired   10:00 AM  06/03/2011 Cipriano Mile OTR/L Pager [phone] Office [phone]

## 2011-06-03 NOTE — Progress Notes (Signed)
Speech Pathology: DysphagiaTreatment Note  Subjective: Awake, sitting upright in recliner  Objective: Treatment focused on diagnostic-treatment of swallow function with various PO consistencies.  Skilled observation revealed oral phase marked by piece meal swallows with a prolonged manipulation/organizational phase and a pharyngeal phase mostly WFL however cognitive-based issues impacted his awareness of likely premature spillage of bolus into pharynx when distracted.  Occasional coughing correlated with these events.  Solid textures with functional mastication however airway impedence risk of solid bolus is high due to cognitive deficits.  Thin liquids were also via piece meal swallows with occasional coughing. *Daughter reported that this behavior (cognitive and swallowing) is typical of her father in a post-ictal state and usually resolves within 2-3 days.  Assessment: Cognitive based oral-pharyngeal dysphagia due to poor awareness with aspiration risks.  Given daughter's report of patient's typical behavior with the likelihood of resolution, will proceed with a PO diet with some risks for aspiration with daily follow-up to ensure safety/tolerance.  Recommendations:  1. Dysphagia 1 (puree) and Thin liquids 2. Crush medications in puree 3. Full supervision with meals due to cognitive deficits 4.  See separate cognitive-linguistic assessment on this date.  Goals: 1. Patient will consume puree/thin liquid diet without overt s/s of aspiration 90% of a meal and minimal assist. 2. Patient will consume upgraded textures without evidence of airway compromise/aspiration with minimal assist.  Pain:   none Intervention Required:   No  Goals: Goals set above.  Myra Rude, M.S.,CCC-SLP Pager [phone]

## 2011-06-03 NOTE — Progress Notes (Signed)
Subjective: Patient's condition is improved.   Objective: Vital signs in last 24 hours: Temp:  [98.8 F (37.1 C)-100.6 F (38.1 C)] 99.5 F (37.5 C) (03/14 0800) Pulse Rate:  [89-101] 95  (03/14 0800) Resp:  [16-22] 21  (03/14 0700) BP: (87-115)/(51-69) 102/60 mmHg (03/14 0800) SpO2:  [70 %-100 %] 94 % (03/14 0800)  Intake/Output from previous day: 03/13 0701 - 03/14 0700 In: 2286 [I.V.:1800; IV Piggyback:486] Out: 1195 [Urine:1195] Intake/Output this shift: Total I/O In: 75 [I.V.:75] Out: 43 [Urine:43] Nutritional status: NPO  Neurological exam: AAO*3. No aphasia.  Was able to tell me months of the year forwards and backwards correctly, exhibiting good attention span. Recall was 3 of 3 after 5 minutes. Followed complex commands. Cranial nerves: EOMI, PERRL. Visual fields were full. Sensation to V1 through V3 areas of the face was intact and symmetric throughout. There was no facial asymmetry. Hearing to finger rub was equal and symmetrical bilaterally. Shoulder shrug was 5/5 and symmetric bilaterally. Head rotation was 5/5 bilaterally. There was no dysarthria or palatal deviation. Motor: strength was 4/5 in proximal RLE, 5/5 everywhere else. Sensory: was intact throughout to light touch, pinprick. Coordination: finger-to-nose were intact b/l. Reflexes: were 2+ in upper extremities and 1+ at the knees and 1+ at the ankles. Plantar response was downgoing bilaterally. Gait: deferred  Lab Results:  Basename 06/02/11 0223 06/02/11 0215  WBC -- 21.0*  HGB -- 12.7*  HCT -- 38.1*  PLT -- 242  NA 144 --  K 4.1 --  CL 102 --  CO2 20 --  GLUCOSE 80 --  BUN 27* --  CREATININE 1.15 --  CALCIUM 9.2 --  LABA1C -- --   Studies/Results: Ct Head Wo Contrast  06/02/2011  *RADIOLOGY REPORT*  Clinical Data: Altered level of consciousness  CT HEAD WITHOUT CONTRAST  Technique:  Contiguous axial images were obtained from the base of the skull through the vertex without contrast.  Comparison:  10/12/2007  Findings: Chronic ischemic changes.  Atrophy.  No mass effect, midline shift, or acute intracranial hemorrhage.  Mild mucosal thickening in the paranasal sinuses.  IMPRESSION: No acute intracranial pathology.  Original Report Authenticated By: Donavan Burnet, [name]   Mr Brain Wo Contrast  06/03/2011  *RADIOLOGY REPORT*  Clinical Data: Right-sided weakness.  Repetitive seizure activity.  MRI HEAD WITHOUT CONTRAST  Technique:  Multiplanar, multiecho pulse sequences of the brain and surrounding structures were obtained according to standard protocol without intravenous contrast.  Comparison: CT head without contrast 06/02/2011.  Findings: The diffusion weighted images demonstrate a focal area of restricted diffusion within the left parous falcine frontal lobe. There is no hemorrhage associated with this T2 and FLAIR hyperintensity is present, compatible with the given time frame.  Previous hemorrhagic infarct of the posterior left insular cortex is again noted.  Mild encephalomalacia along the upper left temporal lobe is again noted.  The ventricles are proportionate to the degree of mild atrophy. Flow is present in the major intracranial arteries.  Mild mucosal thickening is evident in the maxillary sinuses, sphenoid sinuses, and inferior frontal sinuses bilaterally.  There is scattered mucosal thickening within the ethmoid air cells.  The mastoid air cells are clear.  IMPRESSION:  1.  Acute non hemorrhagic infarct in the medial left frontal lobe. 2.  Stable remote hemorrhagic infarct of the posterior left insular cortex and superior left temporal lobe. 3.  Mild generalized atrophy. 4.  Mild diffuse sinus disease.  Original Report Authenticated By: Jamesetta Orleans. MATTERN, [name]  Dg Chest Portable 1 View  06/02/2011  *RADIOLOGY REPORT*  Clinical Data: Fall and seizure  PORTABLE CHEST - 1 VIEW  Technique: Portable  Comparison:  None.  Findings: Patchy airspace opacities at both lung bases left greater  than right.  Upper normal heart size.  No pneumothorax.  IMPRESSION: Bibasilar airspace opacities left greater than right.  Original Report Authenticated By: Donavan Burnet, [name]   Dg Hand Complete Right  06/02/2011  *RADIOLOGY REPORT*  Clinical Data: Swelling over the metacarpals, seizure activity.  RIGHT HAND - COMPLETE 3+ VIEW  Comparison: None.  Findings: Osteopenia.  Degenerative changes at the PIP and DIP joints, mild.  No acute fracture or dislocation.  No aggressive osseous lesion identified.  IMPRESSION: Osteopenia and mild degenerative changes .  No acute osseous abnormality  identified.  Original Report Authenticated By: Waneta Martins, [name]   Medications: I have reviewed the patient's current medications.  Assessment/Plan: 71 years old man with acute non hemorrhagic infarct in the medial left frontal lobe/seizures who is currently back to baseline cognitively with proximal RLE weakness 1) Cardiac monitoring 2) Echo 3) MRA head and neck 4) Lipid profile, HgA1C 5) PT/OT/Speech 6) Transfer to 3000 7) IV fluids 8) Neurochecks q4h 9) Vital signs q4h 10) Speech and swallow eval - ASA pr until passes   LOS: 1 day   [name]

## 2011-06-03 NOTE — Evaluation (Signed)
Speech Language Pathology Evaluation Patient Details Name: [name] MRN: [id number] DOB: 1940-10-21 Today's Date: 06/03/2011  Problem List:  Patient Active Problem List  Diagnoses  . ANEMIA, DEFICIENCY NOS  . DEMENTIA  . ABUSE, ALCOHOL, CONTINUOUS  . ORGANIC BRAIN SYNDROME  . HYPERTENSION  . CVA  . SINUSITIS, CHRONIC NEC  . ALLERGIC RHINITIS  . CIRRHOSIS, ALCOHOLIC, LIVER  . ERECTILE DYSFUNCTION, ORGANIC  . SEIZURE DISORDER  . ABNORMAL RESULT, FUNCTION STUDY, LIVER  . CEREBROVASCULAR ACCIDENT, HX OF  . PANCREATITIS, HX OF  . Status epilepticus   Past Medical History:  Past Medical History  Diagnosis Date  . Seizures   . Stroke    Past Surgical History: History reviewed. No pertinent past surgical history.  SLP Assessment/Plan/Recommendation Assessment Clinical Impression Statement: Demonstrates frontal lobe dysfunction as evidenced by perseverations (verbal), echolalia, severely impaired auditory processiong, dysnomia and poor cognitive awareness of errors.  Patient's daughter reports "this is typical after his seizures" and further stated that patient had some residual word finding and confusional states, intermittently, from previous CVA in 2009.  Patient has been living alone with daily contact/visitation by daughter.  Given new left frontal infarct and pre-morbid deficits, SLP concerned for patient's return to home alone upon D/C.  Discussed this with his daughter.  Recommend CIR consultation to investigate any potential family assistance post-D/C following a rehab stay.  SLP Recommendation/Assessment: Patient will need skilled Speech Lanaguage Pathology Services in the acute care venue to address identified deficits Problem List: Auditory comprehension;Reading comprehension;Written expression;Verbal expression;Orientation;Attention;Memory;Problem Solving;Reasoning;Executive Functioning;Social interaction/pragmatics;Thought organization Therapy Diagnosis: Cognitive  Impairments;Speech and Language deficits  Plan Speech Therapy Frequency: min 2x/week Duration: 2 weeks Follow up Recommendations: Inpatient Rehab  SLP Goals  SLP Goals Potential to Achieve Goals: Fair Potential Considerations: Ability to learn/carryover information;Co-morbidities;Previous level of function Progress/Goals/Alternative treatment plan discussed with pt/caregiver and they: Agree SLP Goal #1: Patient will demonstrate sustained attention in a basic, functional task for 5 minutes with minimal contextual/visual cues. SLP Goal #2: Patient will verbalize basic needs with maximum verbal/visual/contextual cues. SLP Goal #3: Patient will follow basic 1 step commands in automatic, familiar ADL tasks with maximum visual/contextual assist.   Myra Rude, M.S.,CCC-SLP Pager [phone] 06/03/2011, 11:13 AM

## 2011-06-03 NOTE — Procedures (Signed)
EEG ID:  [id number].  HISTORY:  This is a 71 year old man with epilepsy and stroke.  MEDICATIONS:  Keppra and Dilantin.  CONDITION OF RECORDING:  This 16-lead EEG was recorded with the patient in awake and drowsy states.  Background rhythm: background patterns in wakefulness were well organized with a well-sustained posterior dominant rhythm of 4 to 5 Hz, symmetrical and reactive to eye opening and closing.  Drowsiness was associated with mild attenuation of voltage and slowing of frequencies.  Abnormal potentials: intermittent F7-C3 sharp waves were noted.  Continuous left fronto-temporal-central slowing was noted.  ACTIVATION PROCEDURES:  Hyperventilation was not performed.  Photic stimulation was not performed.  EKG:  Single-channel of EKG monitoring was unremarkable.  IMPRESSION:  This was an abnormal awake and drowsy EEG due to presence of intermittent left frontal and left paracentral sharp waves.  This finding may be suggestive of an underlying focus of cortical irritability with a potential  for epileptogenesis in that head region.  Continuous left fronto-temporo-central  slowing which may be suggestive of an underlying structural abnormality and etiology  in that head region.  Clinical correlation is suggested.        ______________________________ Carmell Austria, MD    [id number] D:  06/02/2011 17:52:37  T:  06/02/2011 04:54:09  Job #:  [id number]

## 2011-06-03 NOTE — Progress Notes (Signed)
  Echocardiogram 2D Echocardiogram has been performed.  Jorje Guild [name] 06/03/2011, 12:34 PM

## 2011-06-03 NOTE — Consult Note (Signed)
Physical Medicine and Rehabilitation Consult Reason for Consult: Stroke Referring Phsyician: [name] is an 71 y.o. male.   HPI: 71 year old right-handed male with history of seizures and stroke that was found down by family in with seizure activity and right-sided weakness. He was admitted March 13 due to seizure. It was unclear how long the patient had been seizing. Patient maintained on Dilantin and Keppra. His last noted seizure had been 2009. MRI of the brain showed acute nonhemorrhagic infarction in the medial left frontal lobe as well as stable remote hemorrhagic infarct of the posterior left insular cortex and superior left temporal lobe. Patient was loaded with intravenous Dilantin. Noted Dilantin level on admission of 3.4. Family noted patient was quite religious in taking his seizure medication. He remains on Keppra 1500 mg every 12 hours. Neurology has been consulted EEG pending as well as followup MRI/MRA and echocardiogram. Currently maintained on a dysphagia one thin liquid diet. Physical occupational therapy evaluations are pending. [name] has requested physical medicine rehabilitation consult  Review of Systems  Gastrointestinal: Positive for constipation.  Neurological: Positive for seizures.  All other systems reviewed and are negative.   Past Medical History  Diagnosis Date  . Seizures   . Stroke    History reviewed. No pertinent past surgical history. History reviewed. No pertinent family history. Social History:  reports that he has never smoked. He has never used smokeless tobacco. He reports that he does not drink alcohol or use illicit drugs. Allergies: No Known Allergies Medications Prior to Admission  Medication Dose Route Frequency Provider Last Rate Last Dose  . 0.9 %  sodium chloride infusion  250 mL Intravenous PRN Thana Farr, MD      . 0.9 %  sodium chloride infusion   Intravenous Continuous Thana Farr, MD 75 mL/hr at 06/03/11  0900    . acetaminophen (TYLENOL) tablet 650 mg  650 mg Oral Q6H PRN Thana Farr, MD   650 mg at 06/03/11 1306   Or  . acetaminophen (TYLENOL) suppository 650 mg  650 mg Rectal Q6H PRN Thana Farr, MD      . alum & mag hydroxide-simeth (MAALOX/MYLANTA) 200-200-20 MG/5ML suspension 30 mL  30 mL Oral Q6H PRN Thana Farr, MD      . antiseptic oral rinse (BIOTENE) solution 15 mL  15 mL Mouth Rinse q12n4p Carmell Austria, MD   15 mL at 06/03/11 1600  . chlorhexidine (PERIDEX) 0.12 % solution 15 mL  15 mL Mouth Rinse BID Carmell Austria, MD   15 mL at 06/03/11 0720  . dipyridamole-aspirin (AGGRENOX) 25-200 MG per 12 hr capsule 1 capsule  1 capsule Oral BID Carmell Austria, MD      . docusate sodium (COLACE) capsule 100 mg  100 mg Oral BID Thana Farr, MD      . levETIRAcetam (KEPPRA) 1,500 mg in sodium chloride 0.9 % 100 mL IVPB  1,500 mg Intravenous BID Thana Farr, MD   1,500 mg at 06/03/11 1324  . LORazepam (ATIVAN) injection 1 mg  1 mg Intravenous Once Thana Farr, MD   1 mg at 06/02/11 0230  . LORazepam (ATIVAN) injection 1 mg  1 mg Intravenous Once Thana Farr, MD      . ondansetron [facility]) tablet 4 mg  4 mg Oral Q6H PRN Thana Farr, MD       Or  . ondansetron [name]) injection 4 mg  4 mg Intravenous Q6H PRN Thana Farr, MD      . phenytoin (DILANTIN)  1,000 mg in sodium chloride 0.9 % 250 mL IVPB  1,000 mg Intravenous To Major Thana Farr, MD   1,000 mg at 06/02/11 0343  . phenytoin (DILANTIN) 200 mg in sodium chloride 0.9 % 100 mL IVPB  200 mg Intravenous BID Thana Farr, MD      . phenytoin (DILANTIN) injection 100 mg  100 mg Intravenous Once Thana Farr, MD   100 mg at 06/03/11 1325  . sodium chloride 0.9 % bolus 250 mL  250 mL Intravenous Once Ulice Dash, PA   250 mL at 06/02/11 1032  . sodium chloride 0.9 % injection 3 mL  3 mL Intravenous Q12H Thana Farr, MD   3 mL at 06/03/11 1000  . sodium chloride 0.9 % injection 3 mL  3 mL Intravenous PRN  Thana Farr, MD      . sodium chloride 0.9 % injection 3 mL  3 mL Intravenous Q12H Thana Farr, MD   3 mL at 06/03/11 1000  . zolpidem (AMBIEN) tablet 5 mg  5 mg Oral QHS PRN Thana Farr, MD      . DISCONTD: aspirin suppository 300 mg  300 mg Rectal Daily Thana Farr, MD      . DISCONTD: aspirin suppository 300 mg  300 mg Rectal Daily Ulice Dash, PA   300 mg at 06/03/11 0957  . DISCONTD: aspirin tablet 325 mg  325 mg Oral Daily Thana Farr, MD      . DISCONTD: dipyridamole-aspirin (AGGRENOX) 25-200 MG per 12 hr capsule 1 capsule  1 capsule Oral BID Thana Farr, MD      . DISCONTD: LORazepam (ATIVAN) injection 1 mg  1 mg Intravenous Once Thana Farr, MD      . DISCONTD: phenytoin (DILANTIN) injection 100 mg  100 mg Intravenous Q8H Thana Farr, MD   100 mg at 06/03/11 0640   No current outpatient prescriptions on file as of 06/03/2011.    Home: Home Living Lives With: Alone Receives Help From: Family (Daughter either contacts or visits he dad everyday) Type of Home: House Additional Comments: Pt unable to give complete history due to receptive deficits with intial expressive deficits.  Functional History: Prior Function Driving: Yes Vocation: Retired Comments: Per RN report, pt was living alone and required housecleaning help only.  Functional Status:  Mobility: Bed Mobility Bed Mobility: Yes Rolling Right: 3: Mod assist;With rail Rolling Right Details (indicate cue type and reason): (A) to initiate rolling and complete rolling with tactile and manual cues for hand placement with (A) with RLE OOB. Right Sidelying to Sit: 4: Min assist;With rails;HOB elevated (comment degrees) (HOB 30 degrees) Right Sidelying to Sit Details (indicate cue type and reason): (A) to elevate trunk OOB with cues for techique. Transfers Transfers: Yes Sit to Stand: 1: +2 Total assist;Patient percentage (comment);From bed (pt 50%) Sit to Stand Details (indicate cue type and  reason): (A) to initiate transfer with manual (A) to prevent bilateral knee buckling RLE > LLE.  Pt very impulsive and needs constant cues for technique.  Pt unable to follow commands to stand and needed visual cues and hand cues to intiate transfers. Stand to Sit: 1: +2 Total assist;Patient percentage (comment);To chair/3-in-1 (pt 20%) Stand to Sit Details: (A) to slowly descend to recliner and manual (A) to prevent bilateral knee buckle and rapid descent.   Stand Pivot Transfers: 1: +2 Total assist;Patient percentage (comment) (pt 20%) Stand Pivot Transfer Details (indicate cue type and reason): (A) to complete transfer with manual and tactile  cues to advance hips and LE to recliner.  Hand placement at hips to promote trunk extension and proper weight shift to advance LE into recliner. Ambulation/Gait Ambulation/Gait: No Stairs: No Wheelchair Mobility Wheelchair Mobility: No  ADL: ADL Upper Body Bathing: Simulated;Moderate assistance Upper Body Bathing Details (indicate cue type and reason): cueing for task initiation and sequencing. increased time for purposeful movement in RUE. Where Assessed - Upper Body Bathing: Sitting, bed Upper Body Dressing: Simulated;Moderate assistance Upper Body Dressing Details (indicate cue type and reason): cueing for task initiation and sequencing. increased time for purposeful movement in RUE Where Assessed - Upper Body Dressing: Sitting, bed Toilet Transfer: Simulated;+2 Total assistance;Comment for patient % (20%) Toilet Transfer Details (indicate cue type and reason): bed to chair. blocking bil. knees for buckling. Toilet Transfer Method: Surveyor, minerals: Other (comment) (chair) ADL Comments: Anticipate pt will require max assist with LB ADLs. Pt sat EOB ~7 minutes with bil. knees blocked due to increased agitation and impulsive movements.   Cognition: Cognition Overall Cognitive Status: Impaired Arousal/Alertness:  Awake/alert Orientation Level: Oriented to person Attention: Sustained Sustained Attention: Impaired Sustained Attention Impairment: Verbal basic;Functional basic Memory: Impaired Memory Impairment: Storage deficit;Retrieval deficit;Decreased recall of new information;Decreased long term memory;Decreased short term memory;Prospective memory Decreased Short Term Memory: Verbal basic;Functional basic Awareness: Impaired Awareness Impairment: Emergent impairment;Intellectual impairment Problem Solving: Impaired Problem Solving Impairment: Verbal basic;Functional basic Executive Function: Reasoning;Sequencing;Organizing;Decision Making;Initiating;Self Monitoring;Self Correcting Reasoning: Impaired Sequencing: Impaired Organizing: Impaired Decision Making: Impaired Self Monitoring: Impaired Self Correcting: Impaired Safety/Judgment: Impaired Cognition Arousal/Alertness: Awake/alert Overall Cognitive Status: Difficult to assess Difficult to assess due to: impaired communication (possibly both receptive and expressive aphasia) Orientation Level: Oriented to person Cognition - Other Comments: Unable to consistently follow one step commands.  Able to mimic demonstrations of tasks.  Blood pressure 103/66, pulse 89, temperature 99 F (37.2 C), temperature source Axillary, resp. rate 20, height 6\' 1"  (1.854 m), weight 79.3 kg (174 lb 13.2 oz), SpO2 96.00%. Physical Exam  Nursing note and vitals reviewed. Constitutional: He appears well-developed.  HENT:  Head: Normocephalic.  Neck: Normal range of motion. No thyromegaly present.  Cardiovascular: Normal rate.   Pulmonary/Chest: Breath sounds normal. He has no wheezes.  Abdominal: He exhibits no distension. There is no tenderness.  Musculoskeletal: He exhibits no edema.       RUE with 1+ edema  Neurological: He is alert.       Pt alert. Follows 40-50% of simple commnds.  Right facial droop and tongue deviation. Speech dysarthric. May be  HOH as well.  Some expressive and receptive language deficits also.  Diminished pain sense on the right side.  RUE 4/5.  RLE 0-1/5.  DTR's 3+ on right.  Toes up.   Skin: Skin is warm and dry.    Results for orders placed during the hospital encounter of 06/02/11 (from the past 24 hour(s))  HEPATIC FUNCTION PANEL     Status: Abnormal   Collection Time   06/02/11  2:14 PM      Component Value Range   Total Protein 6.5  6.0 - 8.3 (g/dL)   Albumin 3.5  3.5 - 5.2 (g/dL)   AST 782 (*) 0 - 37 (U/L)   ALT 54 (*) 0 - 53 (U/L)   Alkaline Phosphatase 98  39 - 117 (U/L)   Total Bilirubin 0.8  0.3 - 1.2 (mg/dL)   Bilirubin, Direct 0.2  0.0 - 0.3 (mg/dL)   Indirect Bilirubin 0.6  0.3 - 0.9 (mg/dL)  ALBUMIN     Status: Abnormal   Collection Time   06/03/11  4:35 AM      Component Value Range   Albumin 3.4 (*) 3.5 - 5.2 (g/dL)  PHENYTOIN LEVEL, TOTAL     Status: Normal   Collection Time   06/03/11  4:35 AM      Component Value Range   Phenytoin Lvl 15.3  10.0 - 20.0 (ug/mL)   Ct Head Wo Contrast  06/02/2011  *RADIOLOGY REPORT*  Clinical Data: Altered level of consciousness  CT HEAD WITHOUT CONTRAST  Technique:  Contiguous axial images were obtained from the base of the skull through the vertex without contrast.  Comparison: 10/12/2007  Findings: Chronic ischemic changes.  Atrophy.  No mass effect, midline shift, or acute intracranial hemorrhage.  Mild mucosal thickening in the paranasal sinuses.  IMPRESSION: No acute intracranial pathology.  Original Report Authenticated By: Donavan Burnet, [name]   Mr Brain Wo Contrast  06/03/2011  *RADIOLOGY REPORT*  Clinical Data: Right-sided weakness.  Repetitive seizure activity.  MRI HEAD WITHOUT CONTRAST  Technique:  Multiplanar, multiecho pulse sequences of the brain and surrounding structures were obtained according to standard protocol without intravenous contrast.  Comparison: CT head without contrast 06/02/2011.  Findings: The diffusion weighted images  demonstrate a focal area of restricted diffusion within the left parous falcine frontal lobe. There is no hemorrhage associated with this T2 and FLAIR hyperintensity is present, compatible with the given time frame.  Previous hemorrhagic infarct of the posterior left insular cortex is again noted.  Mild encephalomalacia along the upper left temporal lobe is again noted.  The ventricles are proportionate to the degree of mild atrophy. Flow is present in the major intracranial arteries.  Mild mucosal thickening is evident in the maxillary sinuses, sphenoid sinuses, and inferior frontal sinuses bilaterally.  There is scattered mucosal thickening within the ethmoid air cells.  The mastoid air cells are clear.  IMPRESSION:  1.  Acute non hemorrhagic infarct in the medial left frontal lobe. 2.  Stable remote hemorrhagic infarct of the posterior left insular cortex and superior left temporal lobe. 3.  Mild generalized atrophy. 4.  Mild diffuse sinus disease.  Original Report Authenticated By: Jamesetta Orleans. MATTERN, [name]   Dg Chest Portable 1 View  06/02/2011  *RADIOLOGY REPORT*  Clinical Data: Fall and seizure  PORTABLE CHEST - 1 VIEW  Technique: Portable  Comparison:  None.  Findings: Patchy airspace opacities at both lung bases left greater than right.  Upper normal heart size.  No pneumothorax.  IMPRESSION: Bibasilar airspace opacities left greater than right.  Original Report Authenticated By: Donavan Burnet, [name]   Dg Hand Complete Right  06/02/2011  *RADIOLOGY REPORT*  Clinical Data: Swelling over the metacarpals, seizure activity.  RIGHT HAND - COMPLETE 3+ VIEW  Comparison: None.  Findings: Osteopenia.  Degenerative changes at the PIP and DIP joints, mild.  No acute fracture or dislocation.  No aggressive osseous lesion identified.  IMPRESSION: Osteopenia and mild degenerative changes .  No acute osseous abnormality  identified.  Original Report Authenticated By: Waneta Martins, [name]     Assessment/Plan: Diagnosis: left frontal infarct, prior CVA 1. Does the need for close, 24 hr/day medical supervision in concert with the patient's rehab needs make it unreasonable for this patient to be served in a less intensive setting? Yes and Potentially 2. Co-Morbidities requiring supervision/potential complications: sz d/o, dementia (baseline level?), htn 3. Due to bladder management, bowel management, safety, skin/wound care, disease management, medication  administration, pain management and patient education, does the patient require 24 hr/day rehab nursing? Yes and Potentially 4. Does the patient require coordinated care of a physician, rehab nurse, PT (1-2 hrs/day, 5 days/week), OT (1-2 hrs/day, 5 days/week) and SLP (1-2 hrs/day, 5 days/week) to address physical and functional deficits in the context of the above medical diagnosis(es)? Potentially Addressing deficits in the following areas: balance, endurance, locomotion, strength, transferring, bowel/bladder control, bathing, dressing, feeding, grooming, toileting, cognition, speech, language, swallowing and psychosocial support 5. Can the patient actively participate in an intensive therapy program of at least 3 hrs of therapy per day at least 5 days per week? Potentially 6. The potential for patient to make measurable gains while on inpatient rehab is good 7. Anticipated functional outcomes upon discharge from inpatients are supervision to minimal assist PT, supervision to minimal assist  OT, minimal assist SLP 8. Estimated rehab length of stay to reach the above functional goals is: 2 weeks? 9. Does the patient have adequate social supports to accommodate these discharge functional goals? Potentially 10. Anticipated D/C setting: Home 11. Anticipated post D/C treatments: HH therapy 12. Overall Rehab/Functional Prognosis: excellent  RECOMMENDATIONS: This patient's condition is appropriate for continued rehabilitative care in the  following setting: CIR Patient has agreed to participate in recommended program. Potentially Note that insurance prior authorization may be required for reimbursement for recommended care.  Comment: I'm unclear of the patient's baseline level of physical and cognitive function.  Need to follow up on social supports.  Will need 24 supervision at home. Rehab RN to follow up.   Ivory Broad, MD 06/04/11

## 2011-06-03 NOTE — Evaluation (Signed)
Physical Therapy Evaluation Patient Details Name: [name] MRN: [id number] DOB: 1940/10/22 Today's Date: 06/03/2011  Problem List:  Patient Active Problem List  Diagnoses  . ANEMIA, DEFICIENCY NOS  . DEMENTIA  . ABUSE, ALCOHOL, CONTINUOUS  . ORGANIC BRAIN SYNDROME  . HYPERTENSION  . CVA  . SINUSITIS, CHRONIC NEC  . ALLERGIC RHINITIS  . CIRRHOSIS, ALCOHOLIC, LIVER  . ERECTILE DYSFUNCTION, ORGANIC  . SEIZURE DISORDER  . ABNORMAL RESULT, FUNCTION STUDY, LIVER  . CEREBROVASCULAR ACCIDENT, HX OF  . PANCREATITIS, HX OF  . Status epilepticus    Past Medical History:  Past Medical History  Diagnosis Date  . Seizures   . Stroke    Past Surgical History: History reviewed. No pertinent past surgical history.  PT Assessment/Plan/Recommendation PT Assessment Clinical Impression Statement: Pt is 71 y/o male admitted for s/p seizure with RLE weakness and speech deficits.  Pt will benefit from acute PT services to improve overall mobility, balance, cognition and strength to prepare for safe d/c to next venue. PT Recommendation/Assessment: Patient will need skilled PT in the acute care venue PT Problem List: Decreased strength;Decreased activity tolerance;Decreased balance;Decreased mobility;Decreased coordination;Decreased cognition;Decreased knowledge of use of DME;Decreased safety awareness PT Therapy Diagnosis : Difficulty walking;Abnormality of gait;Generalized weakness PT Plan PT Frequency: Min 4X/week PT Treatment/Interventions: DME instruction;Gait training;Functional mobility training;Therapeutic activities;Therapeutic exercise;Balance training;Neuromuscular re-education;Patient/family education;Cognitive remediation PT Recommendation Recommendations for Other Services: Rehab consult Follow Up Recommendations: Inpatient Rehab Equipment Recommended: None recommended by SLP PT Goals  Acute Rehab PT Goals PT Goal Formulation: Patient unable to participate in goal  setting Time For Goal Achievement: 2 weeks Pt will go Supine/Side to Sit: with modified independence PT Goal: Supine/Side to Sit - Progress: Goal set today Pt will Sit at [facility] of Bed: with modified independence PT Goal: Sit at [facility] Of Bed - Progress: Goal set today Pt will go Sit to Supine/Side: with modified independence PT Goal: Sit to Supine/Side - Progress: Goal set today Pt will go Sit to Stand: with min assist PT Goal: Sit to Stand - Progress: Goal set today Pt will go Stand to Sit: with min assist PT Goal: Stand to Sit - Progress: Goal set today Pt will Transfer Bed to Chair/Chair to Bed: with min assist PT Transfer Goal: Bed to Chair/Chair to Bed - Progress: Goal set today Pt will Stand: with min assist;1 - 2 min PT Goal: Stand - Progress: Goal set today Pt will Ambulate: 1 - 15 feet;with +2 total assist (pt 60%) PT Goal: Ambulate - Progress: Goal set today  PT Evaluation Precautions/Restrictions  Precautions Precautions: Fall Required Braces or Orthoses: No Restrictions Weight Bearing Restrictions: No Prior Functioning  Home Living Lives With: Alone Receives Help From: Family (Daughter either contacts or visits he dad everyday) Type of Home: House Additional Comments: Pt unable to give complete history due to receptive deficits with intial expressive deficits. Prior Function Driving: Yes Vocation: Retired Comments: Per RN report, pt was living alone and required housecleaning help only.  Cognition Cognition Arousal/Alertness: Awake/alert Overall Cognitive Status: Difficult to assess Difficult to assess due to: impaired communication (possibly both receptive and expressive aphasia) Orientation Level: Oriented to person Cognition - Other Comments: Unable to consistently follow one step commands.  Able to mimic demonstrations of tasks. Sensation/Coordination Sensation Light Touch: Not tested Stereognosis: Impaired by gross assessment Coordination Gross Motor  Movements are Fluid and Coordinated: No (R UE) Fine Motor Movements are Fluid and Coordinated: No (RUE) Extremity Assessment RUE Assessment RUE Assessment: Within Functional Limits  LUE Assessment LUE Assessment: Within Functional Limits RLE Assessment RLE Assessment: Exceptions to [name] RLE Strength RLE Overall Strength: Deficits;Unable to assess;Due to impaired cognition RLE Overall Strength Comments: Pt unable to raise RLE to perform bed mobility and needs (A) to complete task.  Trace activiation noted but nothing against gravity. LLE Assessment LLE Assessment: Exceptions to [name] LLE Strength LLE Overall Strength: Deficits LLE Overall Strength Comments: Pt able to perform knee flexion and leg raise in bed as well as bed mobility.  Howvever noticeable left LE knee buckling during transfer.  Unable to assess with MMT due to cognition. Mobility (including Balance) Bed Mobility Bed Mobility: Yes Rolling Right: 3: Mod assist;With rail Rolling Right Details (indicate cue type and reason): (A) to initiate rolling and complete rolling with tactile and manual cues for hand placement with (A) with RLE OOB. Right Sidelying to Sit: 4: Min assist;With rails;HOB elevated (comment degrees) (HOB 30 degrees) Right Sidelying to Sit Details (indicate cue type and reason): (A) to elevate trunk OOB with cues for techique. Transfers Transfers: Yes Sit to Stand: 1: +2 Total assist;Patient percentage (comment);From bed (pt 50%) Sit to Stand Details (indicate cue type and reason): (A) to initiate transfer with manual (A) to prevent bilateral knee buckling RLE > LLE.  Pt very impulsive and needs constant cues for technique.  Pt unable to follow commands to stand and needed visual cues and hand cues to intiate transfers. Stand to Sit: 1: +2 Total assist;Patient percentage (comment);To chair/3-in-1 (pt 20%) Stand to Sit Details: (A) to slowly descend to recliner and manual (A) to prevent bilateral knee buckle and  rapid descent.   Stand Pivot Transfers: 1: +2 Total assist;Patient percentage (comment) (pt 20%) Stand Pivot Transfer Details (indicate cue type and reason): (A) to complete transfer with manual and tactile cues to advance hips and LE to recliner.  Hand placement at hips to promote trunk extension and proper weight shift to advance LE into recliner. Ambulation/Gait Ambulation/Gait: No Stairs: No Wheelchair Mobility Wheelchair Mobility: No  Balance Balance Assessed: Yes Static Sitting Balance Static Sitting - Balance Support: Feet supported Static Sitting - Level of Assistance: 3: Mod assist Static Sitting - Comment/# of Minutes: Intermittent mod (A) due to patient attempting to return to supine and very impulsive with agitation with lines. Exercise    End of Session PT - End of Session Equipment Utilized During Treatment: Gait belt Activity Tolerance: Patient tolerated treatment well Patient left: in chair;with call bell in reach Nurse Communication: Mobility status for transfers;Mobility status for ambulation General Behavior During Session: Agitated Cognition: Impaired Cognitive Impairment: Pt unable to follow verbal simple commands but able to follow visual commands with gestures.  Pt perservates on words such as pt continue to state he is sleepy with orientation questions asked.   Maudine Kluesner 06/03/2011, 12:12 PM Jake Shark, PT DPT [phone]

## 2011-06-04 ENCOUNTER — Other Ambulatory Visit: Payer: Self-pay

## 2011-06-04 ENCOUNTER — Encounter (HOSPITAL_COMMUNITY): Payer: Self-pay | Admitting: Internal Medicine

## 2011-06-04 DIAGNOSIS — G40401 Other generalized epilepsy and epileptic syndromes, not intractable, with status epilepticus: Secondary | ICD-10-CM | POA: Diagnosis not present

## 2011-06-04 DIAGNOSIS — I6330 Cerebral infarction due to thrombosis of unspecified cerebral artery: Secondary | ICD-10-CM

## 2011-06-04 DIAGNOSIS — I4892 Unspecified atrial flutter: Secondary | ICD-10-CM | POA: Diagnosis not present

## 2011-06-04 LAB — CBC
HCT: 34 % — ABNORMAL LOW (ref 39.0–52.0)
Hemoglobin: 11.5 g/dL — ABNORMAL LOW (ref 13.0–17.0)
MCH: 29.8 pg (ref 26.0–34.0)
MCHC: 33.8 g/dL (ref 30.0–36.0)
RDW: 14.8 % (ref 11.5–15.5)

## 2011-06-04 LAB — LIPID PANEL
Cholesterol: 156 mg/dL (ref 0–200)
Triglycerides: 86 mg/dL (ref <150)

## 2011-06-04 MED ORDER — METOPROLOL TARTRATE 25 MG PO TABS
25.0000 mg | ORAL_TABLET | Freq: Once | ORAL | Status: AC
  Administered 2011-06-04: 25 mg via ORAL
  Filled 2011-06-04: qty 1

## 2011-06-04 MED ORDER — SODIUM CHLORIDE 0.9 % IV SOLN
1750.0000 mg | Freq: Two times a day (BID) | INTRAVENOUS | Status: DC
  Administered 2011-06-04 – 2011-06-06 (×4): 1750 mg via INTRAVENOUS
  Filled 2011-06-04 (×6): qty 17.5

## 2011-06-04 MED ORDER — METOPROLOL TARTRATE 25 MG PO TABS
25.0000 mg | ORAL_TABLET | Freq: Two times a day (BID) | ORAL | Status: DC
  Administered 2011-06-04 (×2): 25 mg via ORAL
  Filled 2011-06-04 (×3): qty 1

## 2011-06-04 MED ORDER — DILTIAZEM HCL ER COATED BEADS 120 MG PO CP24
120.0000 mg | ORAL_CAPSULE | Freq: Every day | ORAL | Status: DC
  Administered 2011-06-04 – 2011-06-07 (×4): 120 mg via ORAL
  Filled 2011-06-04 (×4): qty 1

## 2011-06-04 MED ORDER — LABETALOL HCL 5 MG/ML IV SOLN: 10.0000 mg | Freq: Once | INTRAVENOUS | Status: DC

## 2011-06-04 NOTE — Progress Notes (Signed)
Rehab admissions - Evaluated for possible admission.  I spoke with patient and daughter.  Daughter is available and can assist after discharge.  Dtr can work from her home or from patient's home.  Currently total assist +2 with mobility.  I will follow up on Monday and likely can admit to inpatient rehab Monday if medically ready.  Pager [phone]

## 2011-06-04 NOTE — Progress Notes (Signed)
Pt sustaining HR 120's-140's all night but asymptomatic, MD on call notified and treatment orders received. Continue to monitor.

## 2011-06-04 NOTE — Progress Notes (Signed)
Physical Therapy Treatment Patient Details Name: [name] MRN: [id number] DOB: 01-Nov-1940 Today's Date: 06/04/2011  PT Assessment/Plan  PT - Assessment/Plan Comments on Treatment Session: The patient is progressing well today.  Increased processing of more complex commands and information, although inconsistant.  He is able to verbalize his deficit (primarily left leg weakness), but is unable to process that this is a fall and safety risk verbalizing at the end of the session the he could safely get back into the bed on his own without assistance (which is far from true).  Co-session with ST, see ST notes for details.  Not even trace movement of the right leg today, encouraged patient to continue to try to think about moving the leg even if it doesn't actually move throughout the day. I also informed daughter to tell the RN when she is leaving so that the patient will be safe.  He is not aware of his high fall risk and may try to get out of the chair if unsupervised PT Plan: Discharge plan remains appropriate;Frequency remains appropriate PT Frequency: Min 4X/week Follow Up Recommendations: Inpatient Rehab Equipment Recommended: Defer to next venue PT Goals  Acute Rehab PT Goals PT Goal: Supine/Side to Sit - Progress: Progressing toward goal PT Goal: Sit at Edge Of Bed - Progress: Progressing toward goal PT Goal: Sit to Stand - Progress: Progressing toward goal PT Goal: Stand to Sit - Progress: Progressing toward goal PT Transfer Goal: Bed to Chair/Chair to Bed - Progress: Progressing toward goal PT Goal: Stand - Progress: Progressing toward goal  PT Treatment Precautions/Restrictions  Precautions Precautions: Fall Required Braces or Orthoses: No Restrictions Weight Bearing Restrictions: No Mobility (including Balance) Bed Mobility Supine to Sit: 4: Min assist;With rails;HOB elevated (Comment degrees) (HOb 30 degrees) Supine to Sit Details (indicate cue type and reason): min  assist to help prgress right let to EOB and to give trunk last 1/4th boost up to sitting.   Sitting - Scoot to Edge of Bed: With rail;4: Min assist (verbal, tactile, and visual cues to square up hips on EOB.  ) Transfers Sit to Stand: 2: Max assist;From bed;With armrests;With upper extremity assist;From chair/3-in-1 Sit to Stand Details (indicate cue type and reason): max assist to block right knee, support trunk to move anteriorly during transition and to then at end of sit to stand shift patient''s weight to his left side more.  Max verbal cues for hand placement and at times manual assist for hand placement as well.  Stood x4 reps working on upright posture, equal weight shifting and endurance for standing.  Max stand 1.5 mins Stand to Sit: 2: Max assist;With upper extremity assist;With armrests;To chair/3-in-1 Stand to Sit Details: uncontrolled descent, manual assist for hand placement, verbal cues and tactile cues to reach back for chair.  Stand Pivot Transfers: 1: +2 Total assist;From elevated surface Stand Pivot Transfer Details (indicate cue type and reason): patient 60% from elevated bed to recliner chair on his left side with RW.  Assist needed to block right leg and to pull it back while trying to back up.  Support of trunk for balance.   Ambulation/Gait Ambulation/Gait: No (would likely need +3 assist to walk safely (+2 pt, +1 chair))  Static Sitting Balance Static Sitting - Balance Support: Right upper extremity supported;Feet supported Static Sitting - Level of Assistance: 5: Stand by assistance Static Sitting - Comment/# of Minutes: 3-5, standby assist for safety secondary to impulsive movements may put him off balance.   End  of Session PT - End of Session Equipment Utilized During Treatment: Gait belt (RW) Activity Tolerance: Patient limited by fatigue Patient left: in chair;with call bell in reach;with family/visitor present (daughter present.  ) Nurse Communication:  (fall risk  secondary to decreased awareness of deficits/safet) General Behavior During Session: [facility] for tasks performed Cognition: Impaired Cognitive Impairment: following more commands even without visual cues today, co-session with ST, see ST notes for more detail on cognition.    Rollene Rotunda Tytionna Cloyd, PT, DPT [phone] 06/04/2011, 2:18 PM

## 2011-06-04 NOTE — Consult Note (Signed)
CARDIOLOGY CONSULT NOTE  Primary Care Physician: Ginette Otto, MD, MD Referring Physician:  Dr Alanson Aly Date: 06/02/2011  Reason for consultation:  tachycardia  [name] is a 71 y.o. male with a h/o prior stroke and seizures who presents with recurrent seizures.  He was found down by family in with seizure activity and right-sided weakness on Tuesday. He was admitted March 13 due to the seizure.  His last noted seizure had been 2009. MRI of the brain showed acute nonhemorrhagic infarction in the medial left frontal lobe as well as stable remote hemorrhagic infarct of the posterior left insular cortex and superior left temporal lobe. Patient was loaded with intravenous Dilantin.  He has made slow recovery and is therefore admitted to the inpatient rehab service for further management. He developed abrupt onset of tachycardia this am.  Telemetry reveals atrial flutter with 2:1 AV conduction.   Though the patient was mostly asymptomatic with this event, he has had symptoms of "rapid heart beat" previously.  Presently, he has converted back to sinus rhythm.   Presently, he denies symptoms of palpitations, chest pain, shortness of breath, orthopnea, PND, dizziness, or new neurologic sequela today. The patient is tolerating medications without difficulties and is otherwise without complaint today.   Past Medical History  Diagnosis Date  . Seizures   . Stroke    History reviewed. No pertinent past surgical history.     Marland Kitchen antiseptic oral rinse  15 mL Mouth Rinse q12n4p  . chlorhexidine  15 mL Mouth Rinse BID  . dipyridamole-aspirin  1 capsule Oral BID  . docusate sodium  100 mg Oral BID  . gadobenate dimeglumine  15 mL Intravenous Once  . labetalol      . labetalol  10 mg Intravenous Once  . labetalol  10 mg Intravenous Once  . levetiracetam  1,750 mg Intravenous BID  . LORazepam  1 mg Intravenous Once  . metoprolol tartrate  25 mg Oral BID  . metoprolol tartrate  25  mg Oral Once  . phenytoin (DILANTIN) IV  200 mg Intravenous BID  . sodium chloride  3 mL Intravenous Q12H  . sodium chloride  3 mL Intravenous Q12H  . DISCONTD: levetiracetam  1,500 mg Intravenous BID      . sodium chloride 75 mL/hr at 06/03/11 0900    No Known Allergies  History   Social History  . Marital Status: Divorced    Spouse Name: N/A    Number of Children: N/A  . Years of Education: N/A   Occupational History  . Not on file.   Social History Main Topics  . Smoking status: Never Smoker   . Smokeless tobacco: Never Used  . Alcohol Use: No     Quit greater than 5 years ago  . Drug Use: No  . Sexually Active:    Other Topics Concern  . Not on file   Social History Narrative   Lives alone.  Retired from [employer] officer, political party    Family History  Problem Relation Age of Onset  . Hypertension      ROS- All systems are reviewed and negative except as per the HPI above  Physical Exam: Telemetry: Filed Vitals:   06/04/11 0625 06/04/11 1026 06/04/11 1035 06/04/11 1347  BP:  95/68  92/65  Pulse: 142 135 137 143  Temp:  98 F (36.7 C)  97.7 F (36.5 C)  TempSrc:  Oral  Oral  Resp:  18  18  Height:  Weight:      SpO2:  94%  95%    GEN- The patient is well appearing, alert and oriented x 3 today.   Head- normocephalic, atraumatic Eyes-  Sclera clear, conjunctiva pink Ears- hearing intact Oropharynx- clear Neck- supple, no JVP Lymph- no cervical lymphadenopathy Lungs- Clear to ausculation bilaterally, normal work of breathing Heart- Regular rate and rhythm, no murmurs, rubs or gallops, PMI not laterally displaced GI- soft, NT, ND, + BS Extremities- no clubbing, cyanosis, +1 dependant R edema MS- signficant R sided weakness Skin- no rash or lesion Psych- euthymic mood, full affect Neuro- + expressive aphasia and R sided weakness  EKG: reveals likely 2:1 atrial flutter, incomplete RBBB  Labs:   Lab Results  Component Value Date   WBC 12.2*  06/04/2011   HGB 11.5* 06/04/2011   HCT 34.0* 06/04/2011   MCV 88.1 06/04/2011   PLT 199 06/04/2011    Lab 06/02/11 1414 06/02/11 0223  NA -- 144  K -- 4.1  CL -- 102  CO2 -- 20  BUN -- 27*  CREATININE -- 1.15  CALCIUM -- 9.2  PROT 6.5 --  BILITOT 0.8 --  ALKPHOS 98 --  ALT 54* --  AST 240* --  GLUCOSE -- 80   Lab Results  Component Value Date   CKTOTAL 1971* 10/13/2007   CKMB 13.3* 10/13/2007   TROPONINI  Value: 0.05        NO INDICATION OF MYOCARDIAL INJURY. 10/13/2007    Lab Results  Component Value Date   CHOL 156 06/04/2011   Lab Results  Component Value Date   HDL 97 06/04/2011   Lab Results  Component Value Date   LDLCALC 42 06/04/2011   Lab Results  Component Value Date   TRIG 86 06/04/2011   Lab Results  Component Value Date   CHOLHDL 1.6 06/04/2011   No results found for this basename: LDLDIRECT   Echo- EF 60-65%, no significant valvular disease, normal LA size   ASSESSMENT AND PLAN:   1. Tachycardia-  The patient has developed atrial flutter with RVR.  Presently, he is back in sinus rhythm.  I am concerned that atrial flutter may be the source of CVA.  Given this, I would recommend anticoagulation long term with coumadin.  I will defer this decision to neurology. In the interim, I will initiate diltiazem CD 120mg  daily and titrate as BP allows while here. We will follow with you. His Echo reveals no significant structural heart disease and he has no ischemic symptoms.  I would recommend TSH.  If normal, no further CV testing is planned. We will follow with you while he is here.    Hillis Range, MD 06/04/2011  5:59 PM

## 2011-06-04 NOTE — Progress Notes (Signed)
Stroke Team Progress Note  HISTORY [name] is an 71 y.o. male with history of seizures and stroke that was found today by family with repetitive seizure activity. It is unclear how long the patient was seizing-he was last spoke to at about 1100 on 3/12 and was found at about 2345. Patient has continued to exhibit seizure activity while here in the ED. Patient is maintained on Dilantin and Keppra. Patient takes his medications religiously. Followed by a neurologist but has not seen him this year. His last seizure was in 2009. He was not a t-PA candidate secondary to delay in arrival. He will be admitted for further evaluation and treatment.  SUBJECTIVE  H/o seizure disorder with postictal rt hemiparesis in past but seizuree free x 2009. Now admitted with flurry of repititive rt body seizures and post ictal confusion rt hemiparesis now resolving. MRI shows tiny DWI positive lesion left medial frontal cortex on surface which my be seizure related rather than a new stroke OBJECTIVE Filed Vitals:   06/03/11 2355 06/04/11 0145 06/04/11 0400 06/04/11 0625  BP: 110/73 109/73 126/79   Pulse: 130 127 134 142  Temp: 99.5 F (37.5 C) 99.6 F (37.6 C) 100 F (37.8 C)   TempSrc: Oral Oral Oral   Resp: 19 18 16    Height:      Weight:      SpO2: 94% 94% 92%     CBG (last 3) No results found for this basename: GLUCAP:3 in the last 72 hours Intake/Output from previous day: 03/14 0701 - 03/15 0700 In: 510 [P.O.:360; I.V.:150] Out: 703 [Urine:703]  IV Fluid Intake:     . sodium chloride 75 mL/hr at 06/03/11 0900   Medications    . antiseptic oral rinse  15 mL Mouth Rinse q12n4p  . chlorhexidine  15 mL Mouth Rinse BID  . dipyridamole-aspirin  1 capsule Oral BID  . docusate sodium  100 mg Oral BID  . gadobenate dimeglumine  15 mL Intravenous Once  . labetalol      . labetalol  10 mg Intravenous Once  . levetiracetam  1,500 mg Intravenous BID  . LORazepam  1 mg Intravenous Once  .  metoprolol tartrate  25 mg Oral BID  . metoprolol tartrate  25 mg Oral Once  . phenytoin (DILANTIN) IV  200 mg Intravenous BID  . phenytoin (DILANTIN) IV  100 mg Intravenous Once  . sodium chloride  3 mL Intravenous Q12H  . sodium chloride  3 mL Intravenous Q12H  . DISCONTD: aspirin  300 mg Rectal Daily  . DISCONTD: dipyridamole-aspirin  1 capsule Oral BID  . DISCONTD: phenytoin (DILANTIN) IV  100 mg Intravenous Q8H  PRN sodium chloride, acetaminophen, acetaminophen, alum & mag hydroxide-simeth, ondansetron (ZOFRAN) IV, ondansetron, sodium chloride, zolpidem  Diet:  Dysphagia 1 thin liquids Activity:   Up with assistance DVT Prophylaxis:  SCDs   Significant Diagnostic Studies: CBC    Component Value Date/Time   WBC 21.0* 06/02/2011 0215   RBC 4.26 06/02/2011 0215   HGB 12.7* 06/02/2011 0215   HCT 38.1* 06/02/2011 0215   PLT 242 06/02/2011 0215   MCV 89.4 06/02/2011 0215   MCH 29.8 06/02/2011 0215   MCHC 33.3 06/02/2011 0215   RDW 14.4 06/02/2011 0215   LYMPHSABS 1.5 06/02/2011 0215   MONOABS 1.7* 06/02/2011 0215   EOSABS 0.0 06/02/2011 0215   BASOSABS 0.0 06/02/2011 0215   CMP    Component Value Date/Time   NA 144 06/02/2011 0223  K 4.1 06/02/2011 0223   CL 102 06/02/2011 0223   CO2 20 06/02/2011 0223   GLUCOSE 80 06/02/2011 0223   BUN 27* 06/02/2011 0223   CREATININE 1.15 06/02/2011 0223   CALCIUM 9.2 06/02/2011 0223   PROT 6.5 06/02/2011 1414   ALBUMIN 3.4* 06/03/2011 0435   AST 240* 06/02/2011 1414   ALT 54* 06/02/2011 1414   ALKPHOS 98 06/02/2011 1414   BILITOT 0.8 06/02/2011 1414   GFRNONAA 62* 06/02/2011 0223   GFRAA 72* 06/02/2011 0223   COAGS Lab Results  Component Value Date   INR 1.19 06/02/2011   INR 1.1 10/12/2007   INR 1.1 06/17/2007   Lipid Panel    Component Value Date/Time   CHOL 156 06/04/2011 0500   TRIG 86 06/04/2011 0500   HDL 97 06/04/2011 0500   CHOLHDL 1.6 06/04/2011 0500   VLDL 17 06/04/2011 0500   LDLCALC 42 06/04/2011 0500   HgbA1C  No results found for  this basename: HGBA1C   Urine Drug Screen     Component Value Date/Time   LABOPIA NONE DETECTED 10/12/2007 0353   COCAINSCRNUR NONE DETECTED 10/12/2007 0353   LABBENZ POSITIVE* 10/12/2007 0353   AMPHETMU NONE DETECTED 10/12/2007 0353   THCU NONE DETECTED 10/12/2007 0353   LABBARB  Value: NONE DETECTED        DRUG SCREEN FOR MEDICAL PURPOSES ONLY.  IF CONFIRMATION IS NEEDED FOR ANY PURPOSE, NOTIFY LAB WITHIN 5 DAYS. 10/12/2007 0353    Alcohol Level    Component Value Date/Time   ETH  Value: <5        LOWEST DETECTABLE LIMIT FOR SERUM ALCOHOL IS 11 mg/dL FOR MEDICAL PURPOSES ONLY 10/12/2007 0400   CT of the brain  06/02/11 no acute abnormality  MRI of the brain  06/03/11 1.  Acute non hemorrhagic infarct in the medial left frontal lobe versus seizure ictal hyperintensity. 2.  Stable remote hemorrhagic infarct of the posterior left insular cortex and superior left temporal lobe. 3.  Mild generalized atrophy. 4.  Mild diffuse sinus disease.   MRA of the brain no stenosis MRA of the neck  No stenosis 2D Echocardiogram  EF 55-60% with no source of embolus.   Carotid Doppler  See MRA neck  CXR  06/02/11 Bibasilar airspace opacities left greater than right.  EKG  sinus tachycardia, RBBB, left axis deviation.   EEG This was an abnormal awake and drowsy EEG due to presence of intermittent left frontal and left paracentral sharp waves. This finding may be suggestive of an underlying focus of cortical irritability with a potential for epileptogenesis in that head region. Continuous left fronto-temporo-central  slowing which may be suggestive of an underlying structural abnormality and etiology in that head region. Clinical correlation is suggested.  Physical Exam   Pleasant elderly african [place] male not in distress.afebrile. Lungs clear. Cardiac exam no murmur or gallop. Distal pulses well felt. Neurological exam ; awake mildly confused. Diminished attention, recall. Follows 2 step commands.  Dyasrthric. Mild perseveration of ideas. Eye movements full. Face symmetric. Mild rt UE 4/5 weakness and diminished grip and intrinsic hand strength. Rt lower extremity 1/5 with increased tone. Sensation normal. Mild asterixis rt wrist. ASSESSMENT [name] is a 71 y.o. male with a left medial frontal lobe DWI hyperintensity in the setting of repititive seizures-. Infarct versus seizure effect On dipyridamole SR 250 mg/aspirin 25 mg orally twice a day for secondary stroke prevention.  -seizure, on dilantit & keppra. Had not seen his  neuro in >1 yr. dilantin level low on adm 3.4, now 15.3. Dose adjusted by pharm -old stroke, no residual deficit  -global aphasia secondary to sz, resolved -right hemiparesis secondary to postictal versus stroke -leukocytosis, 21.0 ? Aspiration pna. CXR with R>L opacities -elevated LFTs, ? r/t dilatin -tachycardia 120-140s  Hospital day # 2  TREATMENT/PLAN -rehab consult pending -Continur dipyridamole SR 250 mg/aspirin 25 mg orally twice a day for secondary stroke prevention.  -check CBC this am -Increase keppra dose to 1750 mg twice daily. D/w patient and daughter and answered questions. Transfer to Neurohospitalist team  06/04/2011 7:56 AM

## 2011-06-04 NOTE — Progress Notes (Signed)
Speech Pathology: Dysphagia & CognitiveTreatment Note  Subjective: Awake, alert  Objective: Co-treatment with PT.  Skilled observation revealed improved ability to follow basic 1 step commands with approximately 80% accuracy and minimal assist via gestures to ensure comprehension.  Improved sustained and basic selective attention for 2-3 minutes with no assist.  Verbal expression improved with continued phonemic distortions and substitutions with attempts to self correct 50% of the time at the sentence level.  Ability to comprehend complex auditory information was inconsistent with pockets of comprehension, however many instances of perseverative verbalizations from previous statement or facial expressions indicative of not understanding the information.  Patient's daughter present and validated patient's progress with continued cognitive-linguistic deficits. Observation of thin liquids by straw was more timely today without oral residue/holding.  Assessment: Demonstrates improved abilities today with inconsistent expressive/high level receptive deficits.  Recommendations:  1.  Continue current diet  2.  Will f/u on 3/18  Pain:   none Intervention Required:   No  Goals: Goals Partially Met  Myra Rude, M.S.,CCC-SLP Pager [phone]

## 2011-06-04 NOTE — Progress Notes (Signed)
Utilization review completed. Zia Kanner, RN, BSN.  06/04/11  

## 2011-06-05 DIAGNOSIS — I4892 Unspecified atrial flutter: Secondary | ICD-10-CM | POA: Diagnosis not present

## 2011-06-05 DIAGNOSIS — I6350 Cerebral infarction due to unspecified occlusion or stenosis of unspecified cerebral artery: Secondary | ICD-10-CM | POA: Diagnosis not present

## 2011-06-05 LAB — TSH: TSH: 1.032 u[IU]/mL (ref 0.350–4.500)

## 2011-06-05 NOTE — Progress Notes (Signed)
Subjective:  No recurrent seizures or recurrence of atrial flutter.  Objective:  Vital Signs in the last 24 hours: BP 121/78  Pulse 105  Temp(Src) 98.8 F (37.1 C) (Oral)  Resp 20  Ht 6\' 1"  (1.854 m)  Wt 79.3 kg (174 lb 13.2 oz)  BMI 23.07 kg/m2  SpO2 96%  Physical Exam: Thin BM in NAD Lungs:  Clear to A&P Cardiac:  Regular rhythm, normal S1 and S2, no S3 Extremities:  No edema present  CBC:  Basename 06/04/11 0900  WBC 12.2*  NEUTROABS --  HGB 11.5*  HCT 34.0*  MCV 88.1  PLT 199   Telemetry: Sinus rhythm with occasional PVc's  Assessment/Plan:  1. Atrial flutter resolved. 2. Seizure disorder 3. Old CVA  Rec:  Anticoagulation per neuro.  Has [place] score  of 3 and with atrial arrythmia would best be served with systemic anticoagulation.  Darden Palmer.  MD [name] 06/05/2011, 12:08 PM

## 2011-06-05 NOTE — Consult Note (Addendum)
Subjective: Patient is doing well.  Objective: Vital signs in last 24 hours: Temp:  [97.7 F (36.5 C)-99 F (37.2 C)] 98.8 F (37.1 C) (03/16 1047) Pulse Rate:  [101-143] 105  (03/16 1047) Resp:  [18-21] 20  (03/16 1047) BP: (92-125)/(65-78) 121/78 mmHg (03/16 1047) SpO2:  [93 %-98 %] 96 % (03/16 1047)  Nutritional status: Dysphagia  Neurological exam: AAO*3. No aphasia.  Was able to tell me months of the year forwards and backwards correctly, exhibiting good attention span. Recall was 3 of 3 after 5 minutes. Followed complex commands. Cranial nerves: EOMI, PERRL. Visual fields were full. Sensation to V1 through V3 areas of the face was intact and symmetric throughout. There was no facial asymmetry. Hearing to finger rub was equal and symmetrical bilaterally. Shoulder shrug was 5/5 and symmetric bilaterally. Head rotation was 5/5 bilaterally. There was mild dysarthria. Motor: strength was 5/5 in RUE and LUE and LLE - patient would not give good effort in RLE, but right ankle plantarflexion ws 5/5. Sensory: was intact throughout to light touch, pinprick. Coordination: finger-to-nose were intact and symmetric bilaterally. Reflexes: were 2+ in upper extremities and 1+ at the knees and 1+ at the ankles. Plantar response was downgoing bilaterally. Gait: deferred  Lab Results:  Basename 06/04/11 0900  WBC 12.2*  HGB 11.5*  HCT 34.0*  PLT 199  NA --  K --  CL --  CO2 --  GLUCOSE --  BUN --  CREATININE --  CALCIUM --  LABA1C --   Lipid Panel  Basename 06/04/11 0500  CHOL 156  TRIG 86  HDL 97  CHOLHDL 1.6  VLDL 17  LDLCALC 42   Studies/Results: [facility] Contrast  06/04/2011  *RADIOLOGY REPORT*  Clinical Data: Hemorrhagic infarct.  Seizures.  Hypertension. Dementia.  Alcoholic cirrhosis.  MRA HEAD WITHOUT CONTRAST  Technique: Angiographic images of the Circle of Willis were obtained using MRA technique without intravenous contrast.  Comparison: MRI brain 06/02/2011.   Findings: Mild non-stenotic irregularity skull base and cavernous internal carotid arteries.  Slight broad-based outpouching inferior cavernous segment left internal carotid artery could represent atheromatous change. Supraclinoid internal carotid arteries widely patent.  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  The closest main vessel is noted in the artery of Heubner.  No other berry aneurysms are seen.  Basilar artery widely patent, with vertebrals codominant.  There is no proximal stenosis of the anterior, middle, or posterior cerebral arteries.  Mild irregularity distal MCA and PCA branches suggest intracranial atherosclerotic change.  There is no visible proximal lesion of the pericallosal arteries.  IMPRESSION: Mild irregularity distal MCA and PCA branches suggest intracranial atherosclerotic change.  Slight broad-based outpouching in the cavernous and left internal carotid artery also may be a manifestation of intracranial atherosclerotic disease.  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  No visible ACA lesion with regard to the patient's observed hemorrhagic infarct.  Original Report Authenticated By: Elsie Stain, [name]   Mr Angiogram Neck W Wo Contrast  06/04/2011  *RADIOLOGY REPORT*  Clinical Data:  Seizures.  MRA NECK WITHOUT AND WITH CONTRAST  Technique:  Angiographic images of the neck were obtained using MRA technique without and with intravenous contrast.  Carotid stenosis measurements (when applicable) are obtained utilizing NASCET criteria, using the distal internal carotid diameter as the denominator.  Contrast: 15mL MULTIHANCE GADOBENATE DIMEGLUMINE 529 MG/ML IV SOLN  Comparison:  MRI brain 06/02/2011.  Findings:  Conventional branching of the great vessels from the arch.  Moderate irregularity proximal left common carotid artery estimated  50-75%  stenosis.  Moderate irregularity proximal left subclavian estimated 50-75% stenosis.  Severe narrowing left subclavian distal to the vertebral origin estimated to be 75-90%. Similar long segment 75-90% stenosis right subclavian distal to the vertebral takeoff.  75-90% stenosis proximal right common carotid artery.  Carotid bifurcations widely patent.  No definite vertebral ostial stenosis.  Vertebrals codominant through the neck.  No cervical ICA disease.  IMPRESSION: Extracranial atherosclerotic changes as described.  Significant proximal disease of the right and left common carotid arteries as well as both subclavians is suspected.  Original Report Authenticated By: Elsie Stain, [name]   Medications: I have reviewed the patient's current medications.  Assessment/Plan: 71 years old man with new slurred speech and left leg weakness - likely due to a new CVA 1) Patient will be discharged to inpatient rehab on Monday 2) Heart rate is currently managed better with cardizem 3) We will follow  LOS: 3 days   Shyam Dawson

## 2011-06-05 NOTE — Progress Notes (Signed)
CSW received consult for SNF. Anticipated CIR admission Monday per Rehab Coordinator note. No other CSW needs. CSW signing off. Please re-consult if SNF needed.  Dellie Burns, MSW, [place] [phone] (weekend)

## 2011-06-05 NOTE — Progress Notes (Signed)
Occupational Therapy Treatment Patient Details Name: [name] MRN: [id number] DOB: 13-Dec-1940 Today's Date: 06/05/2011 11:57-12:44  3sc OT Assessment/Plan OT Assessment/Plan Comments on Treatment Session: Pt motivated to participate in session.  Still with no functional movement in the RLE.  Was able to use the RW to take a few steps to the sink and to the bedside chair with total assist incluing total assist for advancing the RLE.  At times had difficulty understanding therapists instructions and  needed demonstrational cueing to sequence through tasks. OT Plan: Discharge plan remains appropriate OT Frequency: Min 2X/week Follow Up Recommendations: Inpatient Rehab Equipment Recommended: Defer to next venue OT Goals ADL Goals ADL Goal: Toilet Transfer - Progress: Progressing toward goals Miscellaneous OT Goals OT Goal: Miscellaneous Goal #1 - Progress: Progressing toward goals  OT Treatment Precautions/Restrictions  Precautions Precautions: Fall Precaution Comments: severe RLE weakness Required Braces or Orthoses: No Restrictions Weight Bearing Restrictions: No   ADL ADL Grooming: Performed;Wash/dry hands;Maximal assistance Where Assessed - Grooming: Standing at sink Toilet Transfer: Simulated;+1 Total assistance Toilet Transfer Details (indicate cue type and reason): stand pivot with rolling walker.  Pt needs total assist with advancing the RLE Toilet Transfer Method: Ambulating Toilet Transfer Equipment: Raised toilet seat with arms (or 3-in-1 over toilet) Toileting - Clothing Manipulation: Simulated;+2 Total assistance Toileting - Clothing Manipulation Details (indicate cue type and reason): pt 30 % Where Assessed - Toileting Clothing Manipulation: Sit to stand from 3-in-1 or toilet Toileting - Hygiene: Not assessed Mobility  Bed Mobility Bed Mobility: Yes Rolling Right: 4: Min assist Right Sidelying to Sit: 4: Min assist;HOB flat Transfers Transfers:  Yes Sit to Stand: 2: Max assist;With upper extremity assist;From bed Stand to Sit: 1: +1 Total assist Stand to Sit Details: Pt with uncontrolled descent times X3 with transitioning stand to sit.  End of Session OT - End of Session Equipment Utilized During Treatment: Gait belt;Other (comment) (rolling walker) Activity Tolerance: Patient tolerated treatment well Patient left: in chair;with call bell in reach General Behavior During Session: [facility] for tasks performed Cognition: Impaired Cognitive Impairment: Pt needed rpetition of commands at times to understand directions.  Mills Mitton OTR/L  06/05/2011, 1:34 PM Pager number [phone]

## 2011-06-06 DIAGNOSIS — I6350 Cerebral infarction due to unspecified occlusion or stenosis of unspecified cerebral artery: Secondary | ICD-10-CM | POA: Diagnosis not present

## 2011-06-06 DIAGNOSIS — I4892 Unspecified atrial flutter: Secondary | ICD-10-CM | POA: Diagnosis not present

## 2011-06-06 MED ORDER — WARFARIN SODIUM 5 MG PO TABS
5.0000 mg | ORAL_TABLET | Freq: Once | ORAL | Status: AC
  Administered 2011-06-06: 5 mg via ORAL
  Filled 2011-06-06: qty 1

## 2011-06-06 MED ORDER — PHENYTOIN SODIUM EXTENDED 100 MG PO CAPS
400.0000 mg | ORAL_CAPSULE | Freq: Every day | ORAL | Status: DC
  Filled 2011-06-06: qty 4

## 2011-06-06 MED ORDER — PHENYTOIN 125 MG/5ML PO SUSP
400.0000 mg | Freq: Every day | ORAL | Status: DC
  Administered 2011-06-06: 400 mg via ORAL
  Filled 2011-06-06 (×2): qty 16

## 2011-06-06 MED ORDER — LEVETIRACETAM 750 MG PO TABS
1750.0000 mg | ORAL_TABLET | Freq: Two times a day (BID) | ORAL | Status: DC
  Filled 2011-06-06: qty 1

## 2011-06-06 MED ORDER — LEVETIRACETAM 100 MG/ML PO SOLN
1750.0000 mg | Freq: Two times a day (BID) | ORAL | Status: DC
  Administered 2011-06-06 – 2011-06-07 (×2): 1750 mg via ORAL
  Filled 2011-06-06 (×3): qty 20

## 2011-06-06 MED ORDER — WARFARIN - PHARMACIST DOSING INPATIENT: Freq: Every day | Status: DC

## 2011-06-06 NOTE — Progress Notes (Signed)
ANTICOAGULATION CONSULT NOTE - Initial Consult  Pharmacy Consult for Coumadin Indication: atrial fibrillation  No Known Allergies  Patient Measurements: Height: 6\' 1"  (185.4 cm) Weight: 174 lb 13.2 oz (79.3 kg) IBW/kg (Calculated) : 79.9   Vital Signs: Temp: 98.8 F (37.1 C) (03/17 0900) Temp src: Oral (03/17 0900) BP: 116/69 mmHg (03/17 0900) Pulse Rate: 100  (03/17 0900)  Labs:  Basename 06/04/11 0900  HGB 11.5*  HCT 34.0*  PLT 199  APTT --  LABPROT --  INR --  HEPARINUNFRC --  CREATININE --  CKTOTAL --  CKMB --  TROPONINI --   Estimated Creatinine Clearance: 65.1 ml/min (by C-G formula based on Cr of 1.15).  Medical History: Past Medical History  Diagnosis Date  . Seizures   . Stroke     Medications:  Scheduled:    . diltiazem  120 mg Oral Daily  . dipyridamole-aspirin  1 capsule Oral BID  . docusate sodium  100 mg Oral BID  . levetiracetam  1,750 mg Intravenous BID  . LORazepam  1 mg Intravenous Once  . phenytoin (DILANTIN) IV  200 mg Intravenous BID  . sodium chloride  3 mL Intravenous Q12H  . DISCONTD: antiseptic oral rinse  15 mL Mouth Rinse q12n4p  . DISCONTD: chlorhexidine  15 mL Mouth Rinse BID  . DISCONTD: sodium chloride  3 mL Intravenous Q12H    Assessment: 71 y/o male patient s/p stroke, with atrial fibrillation requiring initiation of coumadin for further stroke prevention. Noted drug interaction with Dilantin. Coumadin metabolism will inhibited initially then induced so expect INR to rapidly increase then become stable.   Goal of Therapy:  INR 2-3   Plan:  Coumadin 5mg  today, daily protime and f/u in am.  Verlene Mayer, PharmD, BCPS Pager [phone] 06/06/2011,2:19 PM

## 2011-06-06 NOTE — Progress Notes (Addendum)
Subjective:  No recurrent seizures or recurrence of atrial flutter. Not SOB, No chest pain.  Objective:  Vital Signs in the last 24 hours: BP 112/70  Pulse 99  Temp(Src) 99.1 F (37.3 C) (Oral)  Resp 20  Ht 6\' 1"  (1.854 m)  Wt 79.3 kg (174 lb 13.2 oz)  BMI 23.07 kg/m2  SpO2 93%  Physical Exam: Thin BM in NAD Lungs:  Clear to A&P Cardiac:  Regular rhythm, normal S1 and S2, no S3 Extremities:  No edema present  CBC:  Basename 06/04/11 0900  WBC 12.2*  NEUTROABS --  HGB 11.5*  HCT 34.0*  MCV 88.1  PLT 199   Telemetry: Sinus rhythm with occasional PVc's  Assessment/Plan:  1. Atrial flutter resolved. 2. Seizure disorder 3. Old CVA  Rec:  Anticoagulation per neuro.  Has [place] score  of 3 and with atrial arrythmia would best be served with systemic anticoagulation.  Darden Palmer.  MD [facility] 06/06/2011, 9:50 AM

## 2011-06-06 NOTE — Plan of Care (Signed)
Cardiology input in managing the patient is appreciated. Typically, anticoagulation is held in a setting of acute stroke. Typically, it is started in 4 to 7 days after the initial event. An anti-platelet agent (aggrenox) will be given today. We will start coumadin per pharmacy tonight, which would be roughly 5 days since his initial event.  Carmell Austria, MD

## 2011-06-06 NOTE — Consult Note (Signed)
Subjective: Patient is stable.   Objective: Vital signs in last 24 hours: Temp:  [98 F (36.7 C)-99.6 F (37.6 C)] 99.1 F (37.3 C) (03/17 0607) Pulse Rate:  [99-118] 99  (03/17 0607) Resp:  [20] 20  (03/17 0607) BP: (112-150)/(70-90) 112/70 mmHg (03/17 0607) SpO2:  [92 %-97 %] 93 % (03/17 4098)  Nutritional status: Dysphagia  Neurological exam: AAO*3. No aphasia.  Was able to tell me months of the year forwards and backwards correctly, exhibiting good attention span. Recall was 3 of 3 after 5 minutes. Followed complex commands. Cranial nerves: EOMI, PERRL. Visual fields were full. Sensation to V1 through V3 areas of the face was intact and symmetric throughout. There was no facial asymmetry. Hearing to finger rub was equal and symmetrical bilaterally. Shoulder shrug was 5/5 and symmetric bilaterally. Head rotation was 5/5 bilaterally. There was mild dysarthria. Motor: strength was 5/5 in RUE and LUE and LLE - patient would not give good effort in RLE, but right ankle plantarflexion ws 5/5. Sensory: was intact throughout to light touch, pinprick. Coordination: finger-to-nose were intact and symmetric bilaterally. Reflexes: were 2+ in upper extremities and 1+ at the knees and 1+ at the ankles. Plantar response was downgoing bilaterally. Gait: deferred Lab Results:  Basename 06/04/11 0900  WBC 12.2*  HGB 11.5*  HCT 34.0*  PLT 199  NA --  K --  CL --  CO2 --  GLUCOSE --  BUN --  CREATININE --  CALCIUM --  LABA1C --   Lipid Panel  Basename 06/04/11 0500  CHOL 156  TRIG 86  HDL 97  CHOLHDL 1.6  VLDL 17  LDLCALC 42   Medications: I have reviewed the patient's current medications.  Assessment/Plan: 71 years old man with new slurred speech and left leg weakness - likely due to a new CVA  1) Patient will be discharged to inpatient rehab on Monday  2) Heart rate is currently managed better with cardizem  3) We will follow   LOS: 4 days   Gearldine Looney

## 2011-06-07 ENCOUNTER — Inpatient Hospital Stay (HOSPITAL_COMMUNITY): Payer: Medicare Other

## 2011-06-07 ENCOUNTER — Inpatient Hospital Stay (HOSPITAL_COMMUNITY)
Admission: RE | Admit: 2011-06-07 | Discharge: 2011-06-29 | DRG: 945 | Disposition: A | Discharge status: Home Health | Payer: Medicare Other | Source: Ambulatory Visit | Attending: Physical Medicine & Rehabilitation | Admitting: Physical Medicine & Rehabilitation

## 2011-06-07 DIAGNOSIS — I4892 Unspecified atrial flutter: Secondary | ICD-10-CM | POA: Diagnosis not present

## 2011-06-07 DIAGNOSIS — F0390 Unspecified dementia without behavioral disturbance: Secondary | ICD-10-CM

## 2011-06-07 DIAGNOSIS — R471 Dysarthria and anarthria: Secondary | ICD-10-CM

## 2011-06-07 DIAGNOSIS — Z5189 Encounter for other specified aftercare: Principal | ICD-10-CM

## 2011-06-07 DIAGNOSIS — K1120 Sialoadenitis, unspecified: Secondary | ICD-10-CM

## 2011-06-07 DIAGNOSIS — R197 Diarrhea, unspecified: Secondary | ICD-10-CM | POA: Diagnosis not present

## 2011-06-07 DIAGNOSIS — R29810 Facial weakness: Secondary | ICD-10-CM | POA: Diagnosis not present

## 2011-06-07 DIAGNOSIS — Z79899 Other long term (current) drug therapy: Secondary | ICD-10-CM | POA: Diagnosis not present

## 2011-06-07 DIAGNOSIS — G8310 Monoplegia of lower limb affecting unspecified side: Secondary | ICD-10-CM | POA: Diagnosis not present

## 2011-06-07 DIAGNOSIS — I6350 Cerebral infarction due to unspecified occlusion or stenosis of unspecified cerebral artery: Secondary | ICD-10-CM | POA: Diagnosis not present

## 2011-06-07 DIAGNOSIS — G40909 Epilepsy, unspecified, not intractable, without status epilepticus: Secondary | ICD-10-CM

## 2011-06-07 DIAGNOSIS — K5900 Constipation, unspecified: Secondary | ICD-10-CM

## 2011-06-07 DIAGNOSIS — Z8673 Personal history of transient ischemic attack (TIA), and cerebral infarction without residual deficits: Secondary | ICD-10-CM | POA: Diagnosis not present

## 2011-06-07 DIAGNOSIS — R1310 Dysphagia, unspecified: Secondary | ICD-10-CM

## 2011-06-07 DIAGNOSIS — Z7901 Long term (current) use of anticoagulants: Secondary | ICD-10-CM

## 2011-06-07 DIAGNOSIS — G40401 Other generalized epilepsy and epileptic syndromes, not intractable, with status epilepticus: Secondary | ICD-10-CM | POA: Diagnosis not present

## 2011-06-07 DIAGNOSIS — I6330 Cerebral infarction due to thrombosis of unspecified cerebral artery: Secondary | ICD-10-CM

## 2011-06-07 DIAGNOSIS — R488 Other symbolic dysfunctions: Secondary | ICD-10-CM

## 2011-06-07 DIAGNOSIS — E876 Hypokalemia: Secondary | ICD-10-CM

## 2011-06-07 MED ORDER — ONDANSETRON HCL 4 MG PO TABS: 4.0000 mg | ORAL_TABLET | Freq: Four times a day (QID) | ORAL | Status: DC | PRN

## 2011-06-07 MED ORDER — WARFARIN - PHARMACIST DOSING INPATIENT
Freq: Every day | Status: DC
  Administered 2011-06-07: 20:00:00

## 2011-06-07 MED ORDER — PHENYTOIN 125 MG/5ML PO SUSP: 400.0000 mg | Freq: Every day | ORAL | Status: DC

## 2011-06-07 MED ORDER — SORBITOL 70 % SOLN
30.0000 mL | Freq: Every day | Status: DC | PRN
  Filled 2011-06-07: qty 30

## 2011-06-07 MED ORDER — DILTIAZEM HCL ER COATED BEADS 120 MG PO CP24
120.0000 mg | ORAL_CAPSULE | Freq: Every day | ORAL | Status: DC
  Administered 2011-06-07 – 2011-06-29 (×23): 120 mg via ORAL
  Filled 2011-06-07 (×25): qty 1

## 2011-06-07 MED ORDER — PHENYTOIN 125 MG/5ML PO SUSP
135.0000 mg | Freq: Three times a day (TID) | ORAL | Status: DC
  Filled 2011-06-07 (×2): qty 8

## 2011-06-07 MED ORDER — WARFARIN SODIUM 5 MG PO TABS
5.0000 mg | ORAL_TABLET | Freq: Once | ORAL | Status: AC
  Administered 2011-06-07: 5 mg via ORAL
  Filled 2011-06-07: qty 1

## 2011-06-07 MED ORDER — COUMADIN BOOK
Freq: Once | Status: DC
  Filled 2011-06-07: qty 1

## 2011-06-07 MED ORDER — ALUM & MAG HYDROXIDE-SIMETH 200-200-20 MG/5ML PO SUSP: 30.0000 mL | Freq: Four times a day (QID) | ORAL | Status: DC | PRN

## 2011-06-07 MED ORDER — WARFARIN VIDEO: Freq: Once | Status: DC

## 2011-06-07 MED ORDER — ACETAMINOPHEN 325 MG PO TABS
325.0000 mg | ORAL_TABLET | ORAL | Status: DC | PRN
  Administered 2011-06-29: 650 mg via ORAL
  Filled 2011-06-07: qty 2

## 2011-06-07 MED ORDER — LEVETIRACETAM 100 MG/ML PO SOLN
1750.0000 mg | Freq: Two times a day (BID) | ORAL | Status: DC
  Administered 2011-06-07 – 2011-06-08 (×3): 1750 mg via ORAL
  Administered 2011-06-09: 1500 mg via ORAL
  Administered 2011-06-09 – 2011-06-20 (×23): 1750 mg via ORAL
  Administered 2011-06-21: 100 mg via ORAL
  Administered 2011-06-21 – 2011-06-29 (×16): 1750 mg via ORAL
  Filled 2011-06-07 (×48): qty 20

## 2011-06-07 MED ORDER — POLYETHYLENE GLYCOL 3350 17 G PO PACK
17.0000 g | PACK | Freq: Every day | ORAL | Status: DC | PRN
  Filled 2011-06-07: qty 1

## 2011-06-07 MED ORDER — SENNA 8.6 MG PO TABS
1.0000 | ORAL_TABLET | Freq: Two times a day (BID) | ORAL | Status: DC
  Administered 2011-06-07: 8.6 mg via ORAL
  Filled 2011-06-07 (×3): qty 1

## 2011-06-07 MED ORDER — ONDANSETRON HCL 4 MG/2ML IJ SOLN: 4.0000 mg | Freq: Four times a day (QID) | INTRAMUSCULAR | Status: DC | PRN

## 2011-06-07 MED ORDER — WARFARIN SODIUM 5 MG PO TABS
5.0000 mg | ORAL_TABLET | Freq: Once | ORAL | Status: DC
  Filled 2011-06-07: qty 1

## 2011-06-07 MED ORDER — PHENYTOIN 125 MG/5ML PO SUSP
134.0000 mg | Freq: Three times a day (TID) | ORAL | Status: DC
  Administered 2011-06-07 – 2011-06-24 (×50): 134 mg via ORAL
  Filled 2011-06-07 (×58): qty 8

## 2011-06-07 NOTE — Progress Notes (Signed)
Pt. Admitted to 4028 @ 1640, VSS, AOx4, family at bedside.  Safety fall handout reviewed and signed.  Call bell in reach.   Report received at 1520 from [place], [place].

## 2011-06-07 NOTE — Discharge Summary (Signed)
Physician Discharge Summary   Patient ID: ALEXY HELDT [id number] 71 y.o. 1940/06/04  Admit date: 06/02/2011  Discharge date and time: 06/07/2011, 11:58 AM  Admitting Physician: Thana Farr, MD   Discharge Physician: Dr. Lyman Speller  Admission Diagnoses: Status epilepticus [345.3] seizure  Discharge Diagnoses: Seizure and stroke  Admission Condition: fair  Discharged Condition: good  Indication for Admission: AMS  Hospital Course: Patient was initially admitted to hospital for AMS with question of seizure. Patients seizures were treated, however patient remained altered in his mental status along with difficulty moving his left leg.  Initially it was believed it could be Todds paralysis however his left LE hemiparesis did not improve.  MRI of brain was obtained  Showing acute non hemorrhagic infarct in the medial left frontal lobe coorilating with symptoms and telemetry revealed atrial flutter with 2:1 AV conduction.  Cardiology was consulted and agreed with placing patient on coumadin when felt safe by neurology. Patient current HR is controlled and he has been started on Coumadin per pharmacy. No further seizures.      Consults: cardiology and rehabilitation medicine  Significant Diagnostic Studies: radiology: GEX:BMWUX non hemorrhagic infarct in the medial left frontal lobe. and cardiac graphics.  MRA neck:Conventional branching of the great vessels from the  arch. Moderate irregularity proximal left common carotid artery estimated 50-75% stenosis. Moderate irregularity proximal left subclavian estimated 50-75% stenosis. Severe narrowing left subclavian distal to the vertebral origin estimated to be 75-90%. Similar long segment 75-90% stenosis right subclavian distal to the vertebral takeoff. 75-90% stenosis proximal right common carotid artery. Carotid bifurcations widely patent. No definite vertebral ostial stenosis. Vertebrals codominant through the neck. No cervical ICA  disease.   Telemetry: intermittent A-flutter and Echocardiogram: WNL  Treatments: IV hydration, cardiac meds: Diltiazem, anticoagulation: warfarin and LKG:[id number] and Keppra  Discharge Exam: Mental Status: Alert, oriented, thought content appropriate.  Speech fluent without evidence of aphasia.  Able to follow 3 step commands without difficulty. Cranial Nerves: II: visual fields grossly normal, pupils equal, round, reactive to light and accommodation III,IV, VI: ptosis not present, extraocular muscles extra-ocular motions intact bilaterally V,VII: smile symmetric, facial light touch sensation normal bilaterally VIII: hearing normal bilaterally IX,X: gag reflex present XI: trapezius strength/neck flexion strength normal bilaterally XII: tongue strength normal  Motor: Right : Upper extremity    Left:     Upper extremity 5/5 deltoid       5/5 deltoid 4/5 tricep      4/5 tricep 5/5 biceps      5/5 biceps  5/5wrist flexion     5/5 wrist flexion 5/5 wrist extension     5/5 wrist extension 5/5 hand grip      5/5 hand grip  Lower extremity     Lower extremity 0/5 hip flexor      5/5 hip flexor 0/5 hip adductors     5/5 hip adductors 0/5 hip abductors     5/5 hip abductors 1/5 quadricep      5/5 quadriceps  1/5 hamstrings     5/5 hamstrings 1/5 plantar flexion       5/5 plantar flexion 1/5 plantar extension     5/5 plantar extension Tone and bulk:normal tone throughout; no atrophy noted Sensory: Pinprick and light touch intact throughout, bilaterally Deep Tendon Reflexes: 2+ BJ, TJ, KJ   Plantars: Right: upgoing   Left: downgoing Cerebellar: normal finger-to-nose, Left normal heel-to-shin test     Disposition: patient will be transferred to Inpatient rehab for further evaluation,PT, OT.  Patient Instructions:  Medication List  As of 06/07/2011 11:58 AM   STOP taking these medications         aspirin 325 MG tablet      dipyridamole-aspirin 25-200 MG per 12 hr capsule       levETIRAcetam 500 MG tablet      midazolam 10 MG/2ML Soln injection      phenytoin 100 MG ER capsule           Activity: Patient has been advised to not drive, operate heavy machinery, perform activities at heights and participate in water activities until release by outpatient physician.  Diet: regular diet Wound Care: none needed  Felicie Morn PA-C [facility] [phone]  06/07/2011, 12:24 PM    11:58 AM

## 2011-06-07 NOTE — Progress Notes (Signed)
Overall Plan of Care [facility]) Patient Details Name: [name] MRN: [id number] DOB: 1940/10/01  Diagnosis:  Rehabilitation for stroke  Primary Diagnosis:    Left anterior cerebral artery infarct Co-morbidities: Hypokalemic secondary to diarrhea. Diarrhea secondary to laxatives  Functional Problem List  Patient demonstrates impairments in the following areas: Balance, Edema, Motor and Safety  Basic ADL's: grooming, bathing, dressing and toileting Advanced ADL's: simple meal preparation and laundry  Transfers:  bed mobility, bed to chair, toilet, tub/shower, car and furniture Locomotion:  ambulation, wheelchair mobility and stairs  Additional Impairments:  Functional use of upper extremity  Anticipated Outcomes Item Anticipated Outcome  Eating/Swallowing  supervision  Basic self-care  supervision  Tolieting  supervision  Bowel/Bladder    Transfers  Supervision  Locomotion  Supervision gait, min A stairs  Communication  Mod I with basic  Cognition  Supervision with basic  Pain  < 3  Safety/Judgment    Other     Therapy Plan: PT Frequency: 1-2 X/day, 60-90 minutes OT Frequency: 1-2 X/day, 60-90 minutes SLP Frequency: 1-2 X/day, 30-60 minutes;5 out of 7 days   Team Interventions: Item RN PT OT SLP SW TR Other  Self Care/Advanced ADL Retraining  x x      Neuromuscular Re-Education  x x      Therapeutic Activities  x x x  x   UE/LE Strength Training/ROM  x x   x   UE/LE Coordination Activities  x x   x   Visual/Perceptual Remediation/Compensation         DME/Adaptive Equipment Instruction  x x   x   Therapeutic Exercise  x x   x   Balance/Vestibular Training  x x   x   Patient/Family Education  x x x  x   Cognitive Remediation/Compensation  x  x  x   Functional Mobility Training  x x   x   Ambulation/Gait Training  x       Stair Training  x       Wheelchair Propulsion/Positioning  x       Functional Statistician  x       Community  Reintegration  x    x   Dysphagia/Aspiration Landscape architect Facilitation    x     Bladder Management x        Bowel Management x        Disease Management/Prevention         Pain Management x x       Medication Management x        Skin Care/Wound Management  x       Splinting/Orthotics  x       Discharge Planning  x x  x x   Psychosocial Support  x x  x x                      Team Discharge Planning: Destination:  Home Projected Follow-up:  PT Home Health; SLP Home health vs. OP Projected Equipment Needs:  Biomedical engineer involved in discharge planning:  Yes  MD ELOS: 14 days Medical Rehab Prognosis:  Good Assessment: 71 year old male admitted for right lower remedy greater than right upper extremity weakness. Workup revealed a left anterior cerebral artery stroke. The patient now requires CIR levels OT, PT, SLP, 24 7 rehabilitation RN and [name]

## 2011-06-07 NOTE — Evaluation (Signed)
Modified Barium Swallow Procedure Note Patient Details  Name: [name] MRN: [id number] Date of Birth: 1941/03/18  Today's Date: 06/07/2011 Time:  -     Past Medical History:  Past Medical History  Diagnosis Date  . Seizures   . Stroke    Past Surgical History: History reviewed. No pertinent past surgical history. HPI:  71 y.o. male with history of seizures and stroke found by family with repetitive seizure activity.  CCT reveals no acute intracranial abnormality.  Pt with impaired speech/language s/p seizure,  MRI pending.  Bedside swallow completed recommended Dys 1, thin liquids.  MD noted pt. coughing with po's over the weekend.  SLP observed s/s aspiration during breakfast observation.  MBS recommended to assess safety of current diet.     Recommendation/Prognosis  Clinical Impression Dysphagia Diagnosis: Mild oral phase dysphagia;Mild pharyngeal phase dysphagia Clinical impression: Pt. exhibited mild oral dysphagia marked by mildly decreased lingual manipulation and mastication with delayed transit to posterior oral cavity.   Pt.'s cervical vertebrae at levels 2-3 appear large and may be somewhat fused impinging the pharyngeal space leading to decreased ROM and flexibility of pharygneal wall resulting in pharyngeal residuals.  Pt's tongue base retraction is decreased further exacerbating vallecular residue (at times, max amounts) and decreased laryngeal elevation with mild-mod pyriform sinsus residue.  Verbal cues to swallow a second time were moderately effective in decreasing residue and chin tuck posture did not significantly decrease vallecular residue.   Recommend a Dys 3 diet with thin liquids with close adherence to swallow precautions.  Swallow Evaluation Recommendations Solid Consistency: Dysphagia 3 (Mechanical soft) Liquid Consistency: Thin Liquid Administration via: Cup;No straw Medication Administration: Whole meds with puree Supervision: Full  supervision/cueing for compensatory strategies Compensations: Slow rate;Small sips/bites;Multiple dry swallows after each bite/sip Postural Changes and/or Swallow Maneuvers: Seated upright 90 degrees Follow up Recommendations:  (TBD) Prognosis Prognosis for Safe Diet Advancement: Good Individuals Consulted Consulted and Agree with Results and Recommendations: Patient  SLP Assessment/Plan   SLP Goals  SLP Swallowing Goals Patient will consume recommended diet without observed clinical signs of aspiration with: Minimal assistance Patient will utilize recommended strategies during swallow to increase swallowing safety with: Minimal assistance  General:  HPI: 71 y.o. male with history of seizures and stroke found by family with repetitive seizure activity.  CCT reveals no acute intracranial abnormality.  Pt with impaired speech/language s/p seizure,  MRI pending.  Bedside swallow completed recommended Dys 1, thin liquids.  MD noted pt. coughing with po's over the weekend.  SLP observed s/s aspiration during breakfast observation.  MBS recommended to assess safety of current diet. Type of Study: Initial MBS Diet Prior to this Study: Dysphagia 1 (puree);Thin liquids Temperature Spikes Noted: No Respiratory Status: Room air Behavior/Cognition: Pleasant mood;Cooperative;Alert Oral Cavity - Dentition: Adequate natural dentition Oral Motor / Sensory Function: Impaired - see Bedside swallow eval Vision: Functional for self-feeding Patient Positioning: Upright in chair Baseline Vocal Quality: Clear Volitional Cough: Strong Volitional Swallow: Able to elicit Anatomy: Other (Comment) (osteophytes C2-3, appears fused taking up part pharyngeal sp) Pharyngeal Secretions: Not observed secondary MBS  Oral Phase Oral Preparation/Oral Phase Oral Phase: Impaired Oral - Solids Oral - Regular: Delayed oral transit Pharyngeal Phase  Pharyngeal Phase Pharyngeal Phase: Impaired Pharyngeal -  Thin Pharyngeal - Thin Cup: Reduced laryngeal elevation;Reduced tongue base retraction;Pharyngeal residue - valleculae;Pharyngeal residue - pyriform Pharyngeal - Solids Pharyngeal - Regular: Reduced tongue base retraction;Pharyngeal residue - valleculae Cervical Esophageal Phase  Cervical Esophageal Phase Cervical Esophageal Phase: [facility]  Breck Coons [place] [employer] [phone]  06/07/2011

## 2011-06-07 NOTE — Progress Notes (Signed)
Speech Language/Pathology   MBS ordered by MD this am.  RN and MD report coughing with po's consistently over the weekend.  SLP observed pt. With part of breakfast meal.  He exhibited frequent throat clears with cough x 1.  Slightly elevated temp.  Lungs are clear per NSG notes.  Daughter reported to primary SLP, that pt. usually exhibits s/s aspiration when he is post ictal which lasts 2-3 days.  This is pt. 5th day of admit.    REC:  Agree with MBS to fully assess swallow function and safety.  Breck Coons [place] [phone]  06/07/2011

## 2011-06-07 NOTE — Progress Notes (Signed)
Clinical Social Worker received consult for SNF. Per PA, pt will be admitted to CIR. CSW is signing off as no further discharge needs identified.   Dede Query, MSW, [place] Majors [phone]

## 2011-06-07 NOTE — Progress Notes (Signed)
Pharmacy - Coumadin  Pt transferred from unit 3000 to rehab INR = 1.18 today Coumadin for Afib.  Plan: 1) Will continue 5 mg dose  2) Follow up AM INR  Thank you.  Okey Regal, PharmD

## 2011-06-07 NOTE — Progress Notes (Signed)
ANTICOAGULATION CONSULT NOTE - Initial Consult  Pharmacy Consult for Coumadin/phenytoin Indication: atrial fibrillation/hx seizures  No Known Allergies  Patient Measurements: Height: 6\' 1"  (185.4 cm) Weight: 174 lb 13.2 oz (79.3 kg) IBW/kg (Calculated) : 79.9   Vital Signs: Temp: 98.5 F (36.9 C) (03/18 1414) Temp src: Oral (03/18 1414) BP: 114/66 mmHg (03/18 1414) Pulse Rate: 95  (03/18 1414)  Labs:  Basename 06/07/11 1010  HGB --  HCT --  PLT --  APTT --  LABPROT 15.3*  INR 1.18  HEPARINUNFRC --  CREATININE --  CKTOTAL --  CKMB --  TROPONINI --   Estimated Creatinine Clearance: 65.1 ml/min (by C-G formula based on Cr of 1.15).  Medical History: Past Medical History  Diagnosis Date  . Seizures   . Stroke     Medications:  Scheduled:     . coumadin book   Does not apply Once  . diltiazem  120 mg Oral Daily  . dipyridamole-aspirin  1 capsule Oral BID  . docusate sodium  100 mg Oral BID  . levETIRAcetam  1,750 mg Oral BID  . LORazepam  1 mg Intravenous Once  . phenytoin  135 mg Oral TID  . sodium chloride  3 mL Intravenous Q12H  . warfarin  5 mg Oral ONCE-1800  . warfarin  5 mg Oral ONCE-1800  . warfarin   Does not apply Once  . Warfarin - Pharmacist Dosing Inpatient   Does not apply q1800  . DISCONTD: phenytoin  400 mg Oral QHS    Assessment: 71 y/o male patient s/p stroke, with atrial fibrillation requiring initiation of coumadin for further stroke prevention. Noted drug interaction with Dilantin. Coumadin metabolism will inhibited initially then induced so expect INR to rapidly increase then become stable.   INR still essentially unchanged today.  Phenytoin was entered as suspension 400 mg po q24, but suspension is not extended release, and needs to be dosed TID.  Last level 3/14 = 15.3.  Goal of Therapy:  INR 2-3   Plan:  Coumadin 5mg  today, daily protime and f/u in am. Change phenytoin to 135 mg TID.  Reece Leader, Pharm D 06/07/2011 3:39  PM

## 2011-06-07 NOTE — Progress Notes (Signed)
Physical Therapy Treatment Patient Details Name: JOHNTHOMAS LADER MRN: [id number] DOB: 11/19/1940 Today's Date: 06/07/2011  PT Assessment/Plan  PT - Assessment/Plan Comments on Treatment Session: 71 year old male admitted for Infarct versus seizure. Pt making great progress and able to ambulate with total assist 15' today. Pt able to accept weight onto Rt. LE however has significant difficulty with advancing LE secondary to limited hip, knee flexion and no dorsiflexion noted. Pt may eventually be a candidate for an AFO.  Pt desires to work with PT prior to transfer to inpatient rehab as he will likely not get rehab later today secondary to late transfer.  PT Plan: Discharge plan remains appropriate;Frequency remains appropriate PT Frequency: Min 4X/week Follow Up Recommendations: Inpatient Rehab Equipment Recommended: Defer to next venue PT Goals  Acute Rehab PT Goals Pt will go Supine/Side to Sit: with modified independence PT Goal: Supine/Side to Sit - Progress: Progressing toward goal Pt will go Sit to Stand: with min assist PT Goal: Sit to Stand - Progress: Progressing toward goal Pt will go Stand to Sit: with min assist PT Goal: Stand to Sit - Progress: Progressing toward goal Pt will Transfer Bed to Chair/Chair to Bed: with min assist PT Transfer Goal: Bed to Chair/Chair to Bed - Progress: Progressing toward goal Pt will Stand: with min assist;1 - 2 min PT Goal: Stand - Progress: Progressing toward goal Pt will Ambulate: 1 - 15 feet;with +2 total assist (pt 60%) PT Goal: Ambulate - Progress: Met  PT Treatment Precautions/Restrictions  Precautions Precautions: Fall Precaution Comments: significant RLE weakness Required Braces or Orthoses: No Restrictions Weight Bearing Restrictions: No Mobility (including Balance) Bed Mobility Bed Mobility: Yes Rolling Right: 4: Min assist Rolling Right Details (indicate cue type and reason): Tactile cues through Bil. LEs. Repeated  verbal cues for following commands (pt attempting to roll to Lt. side instead of Rt. as cued to do).  Right Sidelying to Sit: 5: Supervision Right Sidelying to Sit Details (indicate cue type and reason): Verbal cues for efficiency, able to upright self without assist Sitting - Scoot to Edge of Bed: 5: Supervision (verbal, tactile, and visual cues to square up hips on EOB.  ) Sitting - Scoot to Edge of Bed Details (indicate cue type and reason): Verbal cues for sequencing and initiation.  Transfers Transfers: Yes Sit to Stand: 2: Max assist;From bed;With upper extremity assist Sit to Stand Details (indicate cue type and reason): PT to appropriately position Bil. lower leg under pt's knees to prepare for stand. Facilitation of anterior pelvic tilt, anterior translation of trunk into standing. Tactile/verbal cues for appropriate and safe hand placement.  Stand to Sit: With upper extremity assist;With armrests;To chair/3-in-1;1: +1 Total assist Stand to Sit Details: Max verbal cues for appropriate UE placement. Assist to help control descent.  Ambulation/Gait Ambulation/Gait: Yes (would likely need +3 assist to walk safely (+2 pt, +1 chair)) Ambulation/Gait Assistance: 1: +2 Total assist;Patient percentage (comment);1: +1 Total assist Ambulation/Gait Assistance Details (indicate cue type and reason): +2 person total assist (pt= 75%) progressing to +1 total assist (pt = 80%) Another person close to follow with chair. Facilitation of Lt. weight shift to allow improved clearance of Rt. LE. PT to assist Rt. LE hip/knee flexion as well as dorsiflexion. Pt appears to have no functional activation of dorsiflexors. Pt with improved weightshift to Rt. however PT also to facilitate with Lt. swing. PT also to faciliate modulated control of Rt knee. No overt buckling or recurvatuum noted today. Ambulation Distance (Feet):  15 Feet Assistive device: Rolling walker Gait Pattern: Step-to pattern;Decreased stance time -  right;Decreased hip/knee flexion - right;Decreased weight shift to left;Decreased weight shift to right;Decreased dorsiflexion - right;Trunk flexed (Rt. LE external rotation) Stairs: No    Exercise  Low Level/ICU Exercises Ankle Circles/Pumps: AROM;AAROM;Both;15 reps;Supine;Seated Heel Slides: AAROM;Right;10 reps;Supine Stabilized Bridging: AROM;Both;10 reps;Supine End of Session PT - End of Session Equipment Utilized During Treatment: Gait belt (RW) Activity Tolerance: Patient tolerated treatment well Patient left: in chair;with call bell in reach Nurse Communication: Mobility status for ambulation;Mobility status for transfers (fall risk secondary to decreased awareness of deficits/safet) General Behavior During Session: [name] for tasks performed Cognition: Impaired Cognitive Impairment: Pt appears to be making great progress, continues to need repetition of commands. Pt motivated and desires to walk again.   Sherrine Maples Cheek 06/07/2011, 3:41 PM  Sherie Don) Carleene Mains PT, DPT Acute Rehabilitation [phone]

## 2011-06-07 NOTE — PMR Pre-admission (Signed)
PMR Admission Coordinator Pre-Admission Assessment  Patient:  [name] is an 71 y.o., male MRN:  [id number] DOB:  Nov 11, 1940 Height:  6\' 1"  (185.4 cm) Weight:  79.3 kg (174 lb 13.2 oz)  Insurance Information: HMO:     PPO:      PCP:      IPA:      80/20:      OTHER:  PRIMARY:Medicare A/B      Policy#:[id number]      Subscriber:Torrez Doucette CM Name:       Phone#:      Fax#:  Pre-Cert#:       Employer:Retired Benefits:  Phone #:      Name:[employer] Eff. Date:05/20/04     Deduct:$1184      Out of Pocket Max:0      Life JXB:[id number] CIR:100%      SNF:100 days   LBD = 10/13/07 Outpatient:80%     Co-Pay:20% Home Health:100%      Co-Pay:none DME:80%     Co-Pay:20% Providers:patient's choice  SECONDARY:[employer] BCBS      Policy#:%[id number]      Subscriber:Rani Sieling CM Name:       Phone#:      Fax#:  Pre-Cert#:       Employer:Retired Benefits:  Phone #:[phone]     Name:  Eff. Date:      Deduct:       Out of Pocket Max:       Life Max:  CIR:       SNF:  Outpatient:      Co-Pay:  Home Health:       Co-Pay:  DME:      Co-Pay:   Current Medical History:   Patient Admitting Diagnosis: Left frontal CVA   History of Present Illness: Admitted 3/13 after being found down by family with seizure activity and right sided weakness.  History of seizures and previous CVA.  MRI showed acute nonhemorrhagic infarct in the medial left frontal lobe.  Has a remote L CVA.  Had AFlutter with RVR on 03/15.  Treated with cardizem and coumadin.  Is scheduled to have a MBS today, 03/18.   Patients Past Medical History:   Past Medical History  Diagnosis Date  . Seizures   . Stroke    Family Medical History:  family history includes Hypertension in an unspecified family member. NIH Stroke scale: Total: 7  Patients Current Diet: Dysphagia  Prior Rehab/Hospitalizations: No previous rehab admissions.  Current Medications: Current facility-administered medications:0.9 %  sodium chloride  infusion, 250 mL, Intravenous, PRN, Thana Farr, MD;  0.9 %  sodium chloride infusion, , Intravenous, Continuous, Thana Farr, MD, Last Rate: 75 mL/hr at 06/04/11 2157;  acetaminophen (TYLENOL) suppository 650 mg, 650 mg, Rectal, Q6H PRN, Thana Farr, MD;  acetaminophen (TYLENOL) tablet 650 mg, 650 mg, Oral, Q6H PRN, Thana Farr, MD, 650 mg at 06/03/11 1306 alum & mag hydroxide-simeth (MAALOX/MYLANTA) 200-200-20 MG/5ML suspension 30 mL, 30 mL, Oral, Q6H PRN, Thana Farr, MD;  diltiazem (CARDIZEM CD) 24 hr capsule 120 mg, 120 mg, Oral, Daily, Hillis Range, MD, 120 mg at 06/06/11 1012;  dipyridamole-aspirin (AGGRENOX) 25-200 MG per 12 hr capsule 1 capsule, 1 capsule, Oral, BID, Carmell Austria, MD, 1 capsule at 06/06/11 2114 docusate sodium (COLACE) capsule 100 mg, 100 mg, Oral, BID, Thana Farr, MD, 100 mg at 06/06/11 2114;  levETIRAcetam (KEPPRA) 100 MG/ML solution 1,750 mg, 1,750 mg, Oral, BID, Riki Rusk, PHARMD, 1,750 mg at 06/06/11 2114;  LORazepam (ATIVAN) injection  1 mg, 1 mg, Intravenous, Once, Thana Farr, MD;  ondansetron [facility]) injection 4 mg, 4 mg, Intravenous, Q6H PRN, Thana Farr, MD ondansetron [facility]) tablet 4 mg, 4 mg, Oral, Q6H PRN, Thana Farr, MD;  phenytoin (DILANTIN) 125 MG/5ML suspension 400 mg, 400 mg, Oral, QHS, Riki Rusk, PHARMD, 400 mg at 06/06/11 2115;  sodium chloride 0.9 % injection 3 mL, 3 mL, Intravenous, PRN, Thana Farr, MD;  sodium chloride 0.9 % injection 3 mL, 3 mL, Intravenous, Q12H, Thana Farr, MD, 3 mL at 06/06/11 2136 warfarin (COUMADIN) tablet 5 mg, 5 mg, Oral, ONCE-1800, Carmell Austria, MD, 5 mg at 06/06/11 1735;  Warfarin - Pharmacist Dosing Inpatient, , Does not apply, q1800, Carmell Austria, MD;  zolpidem (AMBIEN) tablet 5 mg, 5 mg, Oral, QHS PRN, Thana Farr, MD;  DISCONTD: antiseptic oral rinse (BIOTENE) solution 15 mL, 15 mL, Mouth Rinse, q12n4p, Carmell Austria, MD, 15 mL at 06/03/11 1600 DISCONTD: chlorhexidine  (PERIDEX) 0.12 % solution 15 mL, 15 mL, Mouth Rinse, BID, Carmell Austria, MD, 15 mL at 06/05/11 1934;  DISCONTD: levETIRAcetam (KEPPRA) 1,750 mg in sodium chloride 0.9 % 100 mL IVPB, 1,750 mg, Intravenous, BID, Micki Riley, MD, 1,750 mg at 06/06/11 1012;  DISCONTD: levETIRAcetam (KEPPRA) tablet 1,750 mg, 1,750 mg, Oral, BID, Carmell Austria, MD DISCONTD: phenytoin (DILANTIN) 200 mg in sodium chloride 0.9 % 100 mL IVPB, 200 mg, Intravenous, BID, Thana Farr, MD, 200 mg at 06/06/11 1107;  DISCONTD: phenytoin (DILANTIN) ER capsule 400 mg, 400 mg, Oral, QHS, Carmell Austria, MD;  DISCONTD: sodium chloride 0.9 % injection 3 mL, 3 mL, Intravenous, Q12H, Thana Farr, MD, 3 mL at 06/06/11 1000  Additional Precautions/Restrictions: Precautions Precautions: Fall Precaution Comments: severe RLE weakness Required Braces or Orthoses: No Restrictions Weight Bearing Restrictions: No  Therapy Assessments Physical Therapy: Precautions Precautions: Fall Precaution Comments: severe RLE weakness Required Braces or Orthoses: No Home Living Lives With: Alone Receives Help From: Family (Daughter either contacts or visits he dad everyday) Type of Home: House Additional Comments: Pt unable to give complete history due to receptive deficits with intial expressive deficits. Prior Function Driving: Yes Vocation: Retired Comments: Per RN report, pt was living alone and required housecleaning help only.  Coordination Gross Motor Movements are Fluid and Coordinated: No (R UE) Fine Motor Movements are Fluid and Coordinated: No (RUE)  Occupational Therapy: Precautions Precautions: Fall Precaution Comments: severe RLE weakness Required Braces or Orthoses: No Home Living Lives With: Alone Receives Help From: Family (Daughter either contacts or visits he dad everyday) Type of Home: House Additional Comments: Pt unable to give complete history due to receptive deficits with intial expressive deficits. Prior  Function Driving: Yes Vocation: Retired Comments: Per RN report, pt was living alone and required housecleaning help only.  Coordination Gross Motor Movements are Fluid and Coordinated: No (R UE) Fine Motor Movements are Fluid and Coordinated: No (RUE) Restrictions Weight Bearing Restrictions: No ADL Grooming: Performed;Wash/dry hands;Maximal assistance Where Assessed - Grooming: Standing at sink Upper Body Bathing: Simulated;Moderate assistance Upper Body Bathing Details (indicate cue type and reason): cueing for task initiation and sequencing. increased time for purposeful movement in RUE. Where Assessed - Upper Body Bathing: Sitting, bed Upper Body Dressing: Simulated;Moderate assistance Upper Body Dressing Details (indicate cue type and reason): cueing for task initiation and sequencing. increased time for purposeful movement in RUE Where Assessed - Upper Body Dressing: Sitting, bed Toilet Transfer: Simulated;+1 Total assistance Toilet Transfer Details (indicate cue type and reason): stand pivot with rolling walker.  Pt  needs total assist with advancing the RLE Toilet Transfer Method: Ambulating Toilet Transfer Equipment: Raised toilet seat with arms (or 3-in-1 over toilet) Toileting - Clothing Manipulation: Simulated;+2 Total assistance Toileting - Clothing Manipulation Details (indicate cue type and reason): pt 30 % Where Assessed - Toileting Clothing Manipulation: Sit to stand from 3-in-1 or toilet Toileting - Hygiene: Not assessed ADL Comments: Anticipate pt will require max assist with LB ADLs. Pt sat EOB ~7 minutes with bil. knees blocked due to increased agitation and impulsive movements.   SLP Recommendations: Follow up Recommendations: Inpatient Rehab Equipment Recommended: Defer to next venue General Recommendation: Ice chips PRN after oral care;NPO Oral Care Recommendations: Oral care BID Follow up Recommendations: Inpatient Rehab  Prior Function: Driving:  Yes Vocation: Retired Comments: Per RN report, pt was living alone and required housecleaning help only.  ADL Grooming: Performed;Wash/dry hands;Maximal assistance Where Assessed - Grooming: Standing at sink Upper Body Bathing: Simulated;Moderate assistance Upper Body Bathing Details (indicate cue type and reason): cueing for task initiation and sequencing. increased time for purposeful movement in RUE. Where Assessed - Upper Body Bathing: Sitting, bed Upper Body Dressing: Simulated;Moderate assistance Upper Body Dressing Details (indicate cue type and reason): cueing for task initiation and sequencing. increased time for purposeful movement in RUE Where Assessed - Upper Body Dressing: Sitting, bed Toilet Transfer: Simulated;+1 Total assistance Toilet Transfer Details (indicate cue type and reason): stand pivot with rolling walker.  Pt needs total assist with advancing the RLE Toilet Transfer Method: Ambulating Toilet Transfer Equipment: Raised toilet seat with arms (or 3-in-1 over toilet) Toileting - Clothing Manipulation: Simulated;+2 Total assistance Toileting - Clothing Manipulation Details (indicate cue type and reason): pt 30 % Where Assessed - Toileting Clothing Manipulation: Sit to stand from 3-in-1 or toilet Toileting - Hygiene: Not assessed ADL Comments: Anticipate pt will require max assist with LB ADLs. Pt sat EOB ~7 minutes with bil. knees blocked due to increased agitation and impulsive movements.   Additional Prior Functional Levels:  Bed Mobility: I Transfers: I Mobility - Walk/Wheelchair: I Upper Body Dressing: I Lower Body Dressing: I Grooming: I Eating/Drinking: I Toilet Transfer: I Bladder Continence: WNL Bowel Management: WNL Stair Climbing: I Communication: WNL Memory: WNL Cooking/Meal Prep: I (Likes to eat out) Housework: A Money Management: I Driving: yes  Prior Activity Level: Community (5-7x/wk): Went out to eat  daily  ADLs/Mobility: ADL Grooming: Performed;Wash/dry hands;Maximal assistance Where Assessed - Grooming: Standing at sink Upper Body Bathing: Simulated;Moderate assistance Upper Body Bathing Details (indicate cue type and reason): cueing for task initiation and sequencing. increased time for purposeful movement in RUE. Where Assessed - Upper Body Bathing: Sitting, bed Upper Body Dressing: Simulated;Moderate assistance Upper Body Dressing Details (indicate cue type and reason): cueing for task initiation and sequencing. increased time for purposeful movement in RUE Where Assessed - Upper Body Dressing: Sitting, bed Toilet Transfer: Simulated;+1 Total assistance Toilet Transfer Details (indicate cue type and reason): stand pivot with rolling walker.  Pt needs total assist with advancing the RLE Toilet Transfer Method: Ambulating Toilet Transfer Equipment: Raised toilet seat with arms (or 3-in-1 over toilet) Toileting - Clothing Manipulation: Simulated;+2 Total assistance Toileting - Clothing Manipulation Details (indicate cue type and reason): pt 30 % Where Assessed - Toileting Clothing Manipulation: Sit to stand from 3-in-1 or toilet Toileting - Hygiene: Not assessed ADL Comments: Anticipate pt will require max assist with LB ADLs. Pt sat EOB ~7 minutes with bil. knees blocked due to increased agitation and impulsive movements.   Bed Mobility  Bed Mobility: Yes Rolling Right: 4: Min assist Rolling Right Details (indicate cue type and reason): (A) to initiate rolling and complete rolling with tactile and manual cues for hand placement with (A) with RLE OOB. Right Sidelying to Sit: 4: Min assist;HOB flat Right Sidelying to Sit Details (indicate cue type and reason): (A) to elevate trunk OOB with cues for techique. Supine to Sit: 4: Min assist;With rails;HOB elevated (Comment degrees) (HOb 30 degrees) Supine to Sit Details (indicate cue type and reason): min assist to help prgress right let  to EOB and to give trunk last 1/4th boost up to sitting.   Sitting - Scoot to Edge of Bed: With rail;4: Min assist (verbal, tactile, and visual cues to square up hips on EOB.  ) Transfers Transfers: Yes Sit to Stand: 2: Max assist;With upper extremity assist;From bed Sit to Stand Details (indicate cue type and reason): max assist to block right knee, support trunk to move anteriorly during transition and to then at end of sit to stand shift patient''s weight to his left side more.  Max verbal cues for hand placement and at times manual assist for hand placement as well.  Stood x4 reps working on upright posture, equal weight shifting and endurance for standing.  Max stand 1.5 mins Stand to Sit: 1: +1 Total assist Stand to Sit Details: Pt with uncontrolled descent times X3 with transitioning stand to sit. Stand Pivot Transfers: 1: +2 Total assist;From elevated surface Stand Pivot Transfer Details (indicate cue type and reason): patient 60% from elevated bed to recliner chair on his left side with RW.  Assist needed to block right leg and to pull it back while trying to back up.  Support of trunk for balance.   Ambulation/Gait Ambulation/Gait: No (would likely need +3 assist to walk safely (+2 pt, +1 chair)) Stairs: No Wheelchair Mobility Wheelchair Mobility: No Balance Balance Assessed: Yes Static Sitting Balance Static Sitting - Balance Support: Right upper extremity supported;Feet supported Static Sitting - Level of Assistance: 5: Stand by assistance Static Sitting - Comment/# of Minutes: 3-5, standby assist for safety secondary to impulsive movements may put him off balance.    Home Assistive Devices/Equipment:  Home Assistive Devices/Equipment Home Assistive Devices/Equipment: None  Discharge Planning:  Living Arrangements: Alone Support Systems: Children;Family members Do you have any problems obtaining your medications?: No Type of Residence: Private residence Home Care Services:  No Patient expects to be discharged to:: home Expected Discharge Date:  (unknown) Case Management Consult Needed: Yes (Comment)  Previous Home Environment:  Living Arrangements: Alone Support Systems: Children;Family members Do you have any problems obtaining your medications?: No Type of Residence: Private residence Home Care Services: No Patient expects to be discharged to:: home Expected Discharge Date:  (unknown) Home Environment Number of Levels: 1 Previous Home Environment Number of Steps: 1 small Previous Home Environment Is Bedroom on Main Floor?: Yes Previous Home Environment Is Bathroom on Main Floor?: Yes  Discharge Living Setting:  Plans for Discharge Living Setting: House (May go to patient's home or may go to Dtr's home) Discharge Living Setting Number of Levels: 1 Discharge Living Setting Number of Steps: 1 small Discharge Living Setting is Bedroom on Main Floor?: Yes Discharge Living Setting is Bathroom on Main Floor?: Yes  Social/Family/Support Systems:  Patient Roles: Parent Contact Information: Marsel Gail - Dtr (c) [phone] Randa Evens - sister [phone]) Anticipated Caregiver: Enrique Sack - Dtr Anticipated Caregiver's Contact Information: [phone] Ability/Limitations of Caregiver: Dtr can work from home and can assist (  Dtr lives in [place], but can stay in [place] as needed) Caregiver Availability: 24/7 Discharge Plan Discussed with Primary Caregiver: Yes Is Caregiver In Agreement with Plan?: Yes Does Caregiver/Family have Issues with Lodging/Transportation while Pt is in Rehab?: No  Goals/Additional Needs:  Patient/Family Goal for Rehab: PT/OT S/Min A, ST min A goals (ELOS = 2 weeks ?) Cultural Considerations: [employer], attends church weekly Dietary Needs: Dys 1, thin liquids Equipment Needs: TBD Pt/Family Agrees to Admission and willing to participate: Yes Program Orientation Provided & Reviewed with Pt/Caregiver Including Roles  &  Responsibilities: Yes  Preadmission Screen Completed By:  Trish Mage, 06/07/2011 10:28 AM  Patient's condition:  Please see physician update to information in consult dated 06/03/11.  Preadmission Screen Competed ZO:XWRUE Koleen Distance, RN, Time/Date,1024/06/06/13.  Discussed status with Dr. Riley Kill on 06/07/11 at  1026 (time/date) and received telephone approval for admission today.  Admission Coordinator:  Trish Mage, time1027/Date03/18/13

## 2011-06-07 NOTE — Progress Notes (Signed)
Subjective:  No recurrent seizures or recurrence of atrial flutter. Not SOB, No chest pain.  Objective:  Vital Signs in the last 24 hours: BP 104/54  Pulse 96  Temp(Src) 99.4 F (37.4 C) (Oral)  Resp 20  Ht 6\' 1"  (1.854 m)  Wt 174 lb 13.2 oz (79.3 kg)  BMI 23.07 kg/m2  SpO2 92%  Physical Exam: Thin BM in NAD Sleeping but arouses Lungs:  Clear to A&P Cardiac:  Regular rhythm, normal S1 and S2, no S3 Extremities:  No edema present  CBC:  Basename 06/04/11 0900  WBC 12.2*  NEUTROABS --  HGB 11.5*  HCT 34.0*  MCV 88.1  PLT 199   Telemetry: Sinus rhythm   Assessment/Plan:  1. Atrial flutter resolved. 2. Seizure disorder 3. Old CVA  Rec:  Anticoagulation per neuro.  Has [place] score  of 3 and with atrial arrythmia would best be served with systemic anticoagulation. Will sign off. Please call with questions  Jarold Song 7:45 AM 06/07/2011

## 2011-06-07 NOTE — Progress Notes (Signed)
Rehab admissions - Continuing to follow.  I spoke with daughter today.  If MD feels patient is medically ready, I can admit to inpatient rehab today.  Pager [phone]

## 2011-06-08 ENCOUNTER — Other Ambulatory Visit: Payer: Self-pay

## 2011-06-08 LAB — COMPREHENSIVE METABOLIC PANEL
AST: 47 U/L — ABNORMAL HIGH (ref 0–37)
Albumin: 2.7 g/dL — ABNORMAL LOW (ref 3.5–5.2)
Alkaline Phosphatase: 69 U/L (ref 39–117)
BUN: 10 mg/dL (ref 6–23)
Chloride: 108 mEq/L (ref 96–112)
Potassium: 2.2 mEq/L — CL (ref 3.5–5.1)
Total Bilirubin: 0.4 mg/dL (ref 0.3–1.2)

## 2011-06-08 LAB — CBC
HCT: 28.6 % — ABNORMAL LOW (ref 39.0–52.0)
Platelets: 144 10*3/uL — ABNORMAL LOW (ref 150–400)
RDW: 15.5 % (ref 11.5–15.5)
WBC: 6.2 10*3/uL (ref 4.0–10.5)

## 2011-06-08 LAB — BASIC METABOLIC PANEL
CO2: 26 mEq/L (ref 19–32)
Calcium: 8.8 mg/dL (ref 8.4–10.5)
Creatinine, Ser: 0.63 mg/dL (ref 0.50–1.35)
Glucose, Bld: 102 mg/dL — ABNORMAL HIGH (ref 70–99)

## 2011-06-08 LAB — DIFFERENTIAL
Basophils Relative: 1 % (ref 0–1)
Eosinophils Absolute: 0.2 10*3/uL (ref 0.0–0.7)
Eosinophils Relative: 3 % (ref 0–5)
Monocytes Absolute: 0.8 10*3/uL (ref 0.1–1.0)
Monocytes Relative: 13 % — ABNORMAL HIGH (ref 3–12)

## 2011-06-08 MED ORDER — POTASSIUM CHLORIDE 10 MEQ/100ML IV SOLN
10.0000 meq | INTRAVENOUS | Status: AC
  Administered 2011-06-08 (×6): 10 meq via INTRAVENOUS
  Filled 2011-06-08 (×7): qty 100

## 2011-06-08 MED ORDER — WARFARIN SODIUM 5 MG PO TABS
5.0000 mg | ORAL_TABLET | Freq: Once | ORAL | Status: AC
  Filled 2011-06-08: qty 1

## 2011-06-08 MED ORDER — WARFARIN SODIUM 5 MG PO TABS
5.0000 mg | ORAL_TABLET | Freq: Once | ORAL | Status: AC
  Administered 2011-06-08: 5 mg via ORAL
  Filled 2011-06-08: qty 1

## 2011-06-08 NOTE — Progress Notes (Signed)
Occupational Therapy Note  Patient Details  Name: ERYCK NEGRON MRN: [id number] Date of Birth: Jun 09, 1940 Today's Date: 06/08/2011  Pt missed 60 minutes skilled OT session due to medical issues with patient which prohibited therapy. Pt with extreme low K+ and potential cardiac complications, therefore evaluation is on hold until values increase.  Will follow up when hold is cleared.  [name] 06/08/2011, 11:00 AM

## 2011-06-08 NOTE — H&P (Addendum)
Physical Medicine and Rehabilitation Admission H&P  [name] is an 71 y.o. male.  Chief Complaint   Patient presents with   .  Seizures   :  HPI: 71 year old right-handed African American male with history of seizures as well as stroke that was found down by family with seizure activity and right-sided weakness. He was admitted March 13 due to seizure. It was unclear how long the patient had been seizing. Patient has been maintained on Dilantin and Keppra. His last noted seizure had been 2009. MRI of the brain showed acute nonhemorrhagic infarction in the medial left frontal lobe as well as stable remote hemorrhagic infarct of the posterior left insular cortex and superior left temporal lobe. MRA of the neck showed moderate irregularity proximal left common carotid artery estimated 50-75%. Severe narrowing left subclavian distal to the vertebral origin estimated 75-90%. He did not receive TPA. Echocardiogram with ejection fraction of 65% without emboli or wall motion abnormality. Patient was loaded with intravenous Dilantin. Noted Dilantin level on admission of 3.4. Family noted patient was quite religious in taking his seizure medication but had not seen his physician back in approximately one year for followup. He presently remains on Keppra 1750 mg every 12 hours and Dilantin 400 mg at bedtime. His latest Dilantin level was 15.3 on March 14. EEG showed presence of intermittent left frontal and left paracentral sharp waves suggestive of an underlying focus of cortical irritability no seizure activity noted. Patient was seen by neurology service and placed on Aggrenox for stroke prophylaxis. Currently maintained on a dysphagia one thin liquid diet per latest modified barium swallow March 18 and advanced to mechanical soft today. On March 15 patient developed abrupt onset of tachycardia with telemetry revealing atrial flutter with RVR. Noted troponin level of 0.05 no indication of myocardial  injury. Followup cardiology services Dr. Johney Frame and placed on cardizem for rate control and recommendations for Coumadin therapy March 17. Physical occupational therapy evaluations are pending. [name] has requested physical medicine rehabilitation consult  Review of Systems  Gastrointestinal: Positive for constipation.  Neurological: Positive for seizures.  All other systems reviewed and are negative  Past Medical History   Diagnosis  Date   .  Seizures    .  Stroke     History reviewed. No pertinent past surgical history.  Family History   Problem  Relation  Age of Onset   .  Hypertension      Social History: reports that he has never smoked. He has never used smokeless tobacco. He reports that he does not drink alcohol or use illicit drugs.  Allergies: No Known Allergies  Medications Prior to Admission   Medication  Dose  Route  Frequency  Provider  Last Rate  Last Dose   .  0.9 % sodium chloride infusion  250 mL  Intravenous  PRN  Thana Farr, MD     .  0.9 % sodium chloride infusion   Intravenous  Continuous  Thana Farr, MD  75 mL/hr at 06/04/11 2157    .  acetaminophen (TYLENOL) tablet 650 mg  650 mg  Oral  Q6H PRN  Thana Farr, MD   650 mg at 06/03/11 1306    Or   .  acetaminophen (TYLENOL) suppository 650 mg  650 mg  Rectal  Q6H PRN  Thana Farr, MD     .  alum & mag hydroxide-simeth (MAALOX/MYLANTA) 200-200-20 MG/5ML suspension 30 mL  30 mL  Oral  Q6H PRN  Thana Farr,  MD     .  diltiazem (CARDIZEM CD) 24 hr capsule 120 mg  120 mg  Oral  Daily  Hillis Range, MD   120 mg at 06/06/11 1012   .  dipyridamole-aspirin (AGGRENOX) 25-200 MG per 12 hr capsule 1 capsule  1 capsule  Oral  BID  Carmell Austria, MD   1 capsule at 06/06/11 2114   .  docusate sodium (COLACE) capsule 100 mg  100 mg  Oral  BID  Thana Farr, MD   100 mg at 06/06/11 2114   .  gadobenate dimeglumine (MULTIHANCE) injection 15 mL  15 mL  Intravenous  Once  Medication Radiologist, MD   15 mL at  06/03/11 1810   .  labetalol (NORMODYNE,TRANDATE) 5 MG/ML injection         .  labetalol (NORMODYNE,TRANDATE) injection 10 mg  10 mg  Intravenous  Once  Noel Christmas   10 mg at 06/03/11 2215   .  levETIRAcetam (KEPPRA) 100 MG/ML solution 1,750 mg  1,750 mg  Oral  BID  Riki Rusk, PHARMD   1,750 mg at 06/06/11 2114   .  LORazepam (ATIVAN) injection 1 mg  1 mg  Intravenous  Once  Thana Farr, MD   1 mg at 06/02/11 0230   .  LORazepam (ATIVAN) injection 1 mg  1 mg  Intravenous  Once  Thana Farr, MD     .  metoprolol tartrate (LOPRESSOR) tablet 25 mg  25 mg  Oral  Once  Noel Christmas   25 mg at 06/04/11 1610   .  ondansetron (ZOFRAN) tablet 4 mg  4 mg  Oral  Q6H PRN  Thana Farr, MD      Or   .  ondansetron [name]) injection 4 mg  4 mg  Intravenous  Q6H PRN  Thana Farr, MD     .  phenytoin (DILANTIN) 1,000 mg in sodium chloride 0.9 % 250 mL IVPB  1,000 mg  Intravenous  To Major  Thana Farr, MD   1,000 mg at 06/02/11 0343   .  phenytoin (DILANTIN) 125 MG/5ML suspension 400 mg  400 mg  Oral  QHS  Riki Rusk, PHARMD   400 mg at 06/06/11 2115   .  phenytoin (DILANTIN) injection 100 mg  100 mg  Intravenous  Once  Thana Farr, MD   100 mg at 06/03/11 1325   .  sodium chloride 0.9 % bolus 250 mL  250 mL  Intravenous  Once  Ulice Dash, PA   250 mL at 06/02/11 1032   .  sodium chloride 0.9 % injection 3 mL  3 mL  Intravenous  PRN  Thana Farr, MD     .  sodium chloride 0.9 % injection 3 mL  3 mL  Intravenous  Q12H  Thana Farr, MD   3 mL at 06/06/11 2136   .  warfarin (COUMADIN) tablet 5 mg  5 mg  Oral  ONCE-1800  Carmell Austria, MD   5 mg at 06/06/11 1735   .  Warfarin - Pharmacist Dosing Inpatient   Does not apply  q1800  Carmell Austria, MD     .  zolpidem [facility]) tablet 5 mg  5 mg  Oral  QHS PRN  Thana Farr, MD     .  DISCONTD: antiseptic oral rinse (BIOTENE) solution 15 mL  15 mL  Mouth Rinse  q12n4p  Carmell Austria, MD   15 mL at 06/03/11 1600   .  DISCONTD:  aspirin suppository 300 mg  300 mg  Rectal  Daily  Thana Farr, MD     .  DISCONTD: aspirin suppository 300 mg  300 mg  Rectal  Daily  Ulice Dash, PA   300 mg at 06/03/11 0957   .  DISCONTD: aspirin tablet 325 mg  325 mg  Oral  Daily  Thana Farr, MD     .  DISCONTD: chlorhexidine (PERIDEX) 0.12 % solution 15 mL  15 mL  Mouth Rinse  BID  Carmell Austria, MD   15 mL at 06/05/11 1934   .  DISCONTD: dipyridamole-aspirin (AGGRENOX) 25-200 MG per 12 hr capsule 1 capsule  1 capsule  Oral  BID  Thana Farr, MD     .  DISCONTD: labetalol (NORMODYNE,TRANDATE) injection 10 mg  10 mg  Intravenous  Once  Carmell Austria, MD     .  DISCONTD: levETIRAcetam (KEPPRA) 1,500 mg in sodium chloride 0.9 % 100 mL IVPB  1,500 mg  Intravenous  BID  Thana Farr, MD   1,500 mg at 06/04/11 1033   .  DISCONTD: levETIRAcetam (KEPPRA) 1,750 mg in sodium chloride 0.9 % 100 mL IVPB  1,750 mg  Intravenous  BID  Micki Riley, MD   1,750 mg at 06/06/11 1012   .  DISCONTD: levETIRAcetam (KEPPRA) tablet 1,750 mg  1,750 mg  Oral  BID  Carmell Austria, MD     .  DISCONTD: LORazepam (ATIVAN) injection 1 mg  1 mg  Intravenous  Once  Thana Farr, MD     .  DISCONTD: metoprolol tartrate (LOPRESSOR) tablet 25 mg  25 mg  Oral  BID  Noel Christmas   25 mg at 06/04/11 1034   .  DISCONTD: phenytoin (DILANTIN) 200 mg in sodium chloride 0.9 % 100 mL IVPB  200 mg  Intravenous  BID  Thana Farr, MD   200 mg at 06/06/11 1107   .  DISCONTD: phenytoin (DILANTIN) ER capsule 400 mg  400 mg  Oral  QHS  Carmell Austria, MD     .  DISCONTD: phenytoin (DILANTIN) injection 100 mg  100 mg  Intravenous  Q8H  Thana Farr, MD   100 mg at 06/03/11 0640   .  DISCONTD: sodium chloride 0.9 % injection 3 mL  3 mL  Intravenous  Q12H  Thana Farr, MD   3 mL at 06/06/11 1000    No current outpatient prescriptions on file as of 06/07/2011.    Home:  Home Living  Lives With: Alone  Receives Help From: Family (Daughter either contacts or visits he  dad everyday)  Type of Home: House  Additional Comments: Pt unable to give complete history due to receptive deficits with intial expressive deficits.  Functional History:  Prior Function  Driving: Yes  Vocation: Retired  Comments: Per RN report, pt was living alone and required housecleaning help only.  Functional Status:  Mobility:  Bed Mobility  Bed Mobility: Yes  Rolling Right: 4: Min assist  Rolling Right Details (indicate cue type and reason): (A) to initiate rolling and complete rolling with tactile and manual cues for hand placement with (A) with RLE OOB.  Right Sidelying to Sit: 4: Min assist;HOB flat  Right Sidelying to Sit Details (indicate cue type and reason): (A) to elevate trunk OOB with cues for techique.  Supine to Sit: 4: Min assist;With rails;HOB elevated (Comment degrees) (HOb 30 degrees)  Supine to Sit Details (indicate cue type and reason): min assist  to help prgress right let to EOB and to give trunk last 1/4th boost up to sitting.  Sitting - Scoot to Edge of Bed: With rail;4: Min assist (verbal, tactile, and visual cues to square up hips on EOB. )  Transfers  Transfers: Yes  Sit to Stand: 2: Max assist;With upper extremity assist;From bed  Sit to Stand Details (indicate cue type and reason): max assist to block right knee, support trunk to move anteriorly during transition and to then at end of sit to stand shift patient''s weight to his left side more. Max verbal cues for hand placement and at times manual assist for hand placement as well. Stood x4 reps working on upright posture, equal weight shifting and endurance for standing. Max stand 1.5 mins  Stand to Sit: 1: +1 Total assist  Stand to Sit Details: Pt with uncontrolled descent times X3 with transitioning stand to sit.  Stand Pivot Transfers: 1: +2 Total assist;From elevated surface  Stand Pivot Transfer Details (indicate cue type and reason): patient 60% from elevated bed to recliner chair on his left side  with RW. Assist needed to block right leg and to pull it back while trying to back up. Support of trunk for balance.  Ambulation/Gait  Ambulation/Gait: No (would likely need +3 assist to walk safely (+2 pt, +1 chair))  Stairs: No  Wheelchair Mobility  Wheelchair Mobility: No  ADL:  ADL  Grooming: Performed;Wash/dry hands;Maximal assistance  Where Assessed - Grooming: Standing at sink  Upper Body Bathing: Simulated;Moderate assistance  Upper Body Bathing Details (indicate cue type and reason): cueing for task initiation and sequencing. increased time for purposeful movement in RUE.  Where Assessed - Upper Body Bathing: Sitting, bed  Upper Body Dressing: Simulated;Moderate assistance  Upper Body Dressing Details (indicate cue type and reason): cueing for task initiation and sequencing. increased time for purposeful movement in RUE  Where Assessed - Upper Body Dressing: Sitting, bed  Toilet Transfer: Simulated;+1 Total assistance  Toilet Transfer Details (indicate cue type and reason): stand pivot with rolling walker. Pt needs total assist with advancing the RLE  Toilet Transfer Method: Ambulating  Toilet Transfer Equipment: Raised toilet seat with arms (or 3-in-1 over toilet)  Toileting - Clothing Manipulation: Simulated;+2 Total assistance  Toileting - Clothing Manipulation Details (indicate cue type and reason): pt 30 %  Where Assessed - Toileting Clothing Manipulation: Sit to stand from 3-in-1 or toilet  Toileting - Hygiene: Not assessed  ADL Comments: Anticipate pt will require max assist with LB ADLs. Pt sat EOB ~7 minutes with bil. knees blocked due to increased agitation and impulsive movements.  Cognition:  Cognition  Overall Cognitive Status: Impaired  Arousal/Alertness: Awake/alert  Orientation Level: Oriented to person;Oriented to place;Oriented to situation;Disoriented to time  Attention: Sustained  Sustained Attention: Impaired  Sustained Attention Impairment: Verbal  basic;Functional basic  Memory: Impaired  Memory Impairment: Storage deficit;Retrieval deficit;Decreased recall of new information;Decreased long term memory;Decreased short term memory;Prospective memory  Decreased Short Term Memory: Verbal basic;Functional basic  Awareness: Impaired  Awareness Impairment: Emergent impairment;Intellectual impairment  Problem Solving: Impaired  Problem Solving Impairment: Verbal basic;Functional basic  Executive Function: Reasoning;Sequencing;Organizing;Decision Making;Initiating;Self Monitoring;Self Correcting  Reasoning: Impaired  Sequencing: Impaired  Organizing: Impaired  Decision Making: Impaired  Self Monitoring: Impaired  Self Correcting: Impaired  Safety/Judgment: Impaired  Cognition  Arousal/Alertness: Awake/alert  Overall Cognitive Status: Difficult to assess  Difficult to assess due to: impaired communication (possibly both receptive and expressive aphasia)  Orientation Level: Oriented to person;Oriented to place;Oriented to  situation;Disoriented to time  Cognition - Other Comments: Unable to consistently follow one step commands. Able to mimic demonstrations of tasks.  Blood pressure 146/88, pulse 100, temperature 98.8 F (37.1 C), temperature source Oral, resp. rate 20, height 6\' 1"  (1.854 m), weight 79.3 kg (174 lb 13.2 oz), SpO2 94.00%.  Physical Exam  Nursing note and vitals reviewed.  Constitutional: He appears well-developed.  HENT: PERRL, EOMI Head: Normocephalic.  Neck: Normal range of motion. No thyromegaly present.  Cardiovascular: Normal rate.  Pulmonary/Chest: Breath sounds normal. He has no wheezes.  Abdominal: He exhibits no distension. There is no tenderness.  Musculoskeletal: He exhibits no edema.  RUE with 1+ edema  Neurological: He is alert.  Pt alert. Follows simple commnds, needing cueing at times.  Right facial droop and tongue deviation. Speech dysarthric but  Intelligible. He has mild verbal and motor apraxia.  May have mild receptive and expressive language deficits as well. Patient is oriented x3 with good recall. Diminished pain sense on the right side. RUE 4/5. RLE 0-1/5. Mild right pronator drift and decreased FMC on right. DTR's 3+ on right. Toes up.  Skin: Skin is warm and dry  Results for orders placed during the hospital encounter of 06/02/11 (from the past 48 hour(s))   TSH Status: Normal    Collection Time    06/05/11 8:50 AM   Component  Value  Range  Comment    TSH  1.032  0.350 - 4.500 (uIU/mL)     No results found.  Post Admission Physician Evaluation:  1. Functional deficits secondary to  infarction in the medial left frontal lobe. 2. Patient is admitted to receive collaborative, interdisciplinary care between the physiatrist, rehab nursing staff, and therapy team. 3. Patient's level of medical complexity and substantial therapy needs in context of that medical necessity cannot be provided at a lesser intensity of care such as a SNF. 4. Patient has experienced substantial functional loss from his/her baseline which was documented above under the "Functional History" and "Functional Status" headings. Judging by the patient's diagnosis, physical exam, and functional history, the patient has potential for functional progress which will result in measurable gains while on inpatient rehab. These gains will be of substantial and practical use upon discharge in facilitating mobility and self-care at the household level. 5. Physiatrist will provide 24 hour management of medical needs as well as oversight of the therapy plan/treatment and provide guidance as appropriate regarding the interaction of the two. 6. 24 hour rehab nursing will assist with bladder management, bowel management, safety, skin/wound care, disease management, medication administration, pain management and patient education and help integrate therapy concepts, techniques,education, etc. 7. PT will assess and treat for: fxnl  mobility, NMR, LES, adaptive equipment, . Goals are: supervision to min assist. 8. OT will assess and treat for: UES, NMR, ADL's, fxnl mobility, safety, AET. Goals are: mod I to min assist. 9. SLP will assess and treat for: speech intelligibility, swallowing, cognition. Goals are: supervision to min assist. 10. Case Management and Social Worker will assess and treat for psychological issues and discharge planning. 11. Team conference will be held weekly to assess progress toward goals and to determine barriers to discharge. 12. Patient will receive at least 3 hours of therapy per day at least 5 days per week. 13. ELOS and Prognosis: 2-3 weeks good Medical Problem List and Plan:  1. left frontal lobe infarct as well as remote hemorrhagic infarct posterior left insular cortex and superior left temporal lobe  2.Marland Kitchen  DVT Prophylaxis/Anticoagulation: Chronic Coumadin therapy. Monitor for any signs of bleeding  3. Recurrent seizure. Continue Dilantin and Keppra therapy as per neurology services. Will need followup Dilantin level in next couple days. Monitor for any further seizure activity.  4. Dysphagia. Followup per speech therapy.Pt advanced to D3 diet. Monitor for any signs of aspiration  5. Atrial flutter with RVR. Continue Cardizem and follow up cardiology services. HR controlled at present.  Ivory Broad, MD 06/06/11

## 2011-06-08 NOTE — Progress Notes (Signed)
Lab called with panic value of K+ 2.2.  Plan: Will recheck now.  EKG now and start IV runs.  No therapy this am till K levels come up.  Monitor for any cardiac symptoms.

## 2011-06-08 NOTE — Progress Notes (Signed)
Patient ID: [name], male   DOB: 04/11/1940, 71 y.o.   MRN: [id number]  Subjective/Complaints:   Objective: Vital Signs: Blood pressure 111/63, pulse 86, temperature 98.8 F (37.1 C), temperature source Oral, resp. rate 18, height 6\' 1"  (1.854 m), weight 79.3 kg (174 lb 13.2 oz), SpO2 98.00%. Dg Swallowing Func-no Report  06/07/2011  CLINICAL DATA: cva   FLUOROSCOPY FOR SWALLOWING FUNCTION STUDY:  Fluoroscopy was provided for swallowing function study, which was  administered by a speech pathologist.  Final results and recommendations  from this study are contained within the speech pathology report.     Results for orders placed during the hospital encounter of 06/07/11 (from the past 72 hour(s))  CBC     Status: Abnormal   Collection Time   06/08/11  6:50 AM      Component Value Range Comment   WBC 6.2  4.0 - 10.5 (K/uL)    RBC 3.29 (*) 4.22 - 5.81 (MIL/uL)    Hemoglobin 9.7 (*) 13.0 - 17.0 (g/dL)    HCT 62.9 (*) 52.8 - 52.0 (%)    MCV 86.9  78.0 - 100.0 (fL)    MCH 29.5  26.0 - 34.0 (pg)    MCHC 33.9  30.0 - 36.0 (g/dL)    RDW 41.3  24.4 - 01.0 (%)    Platelets 144 (*) 150 - 400 (K/uL)   DIFFERENTIAL     Status: Abnormal   Collection Time   06/08/11  6:50 AM      Component Value Range Comment   Neutrophils Relative 66  43 - 77 (%)    Neutro Abs 4.1  1.7 - 7.7 (K/uL)    Lymphocytes Relative 17  12 - 46 (%)    Lymphs Abs 1.1  0.7 - 4.0 (K/uL)    Monocytes Relative 13 (*) 3 - 12 (%)    Monocytes Absolute 0.8  0.1 - 1.0 (K/uL)    Eosinophils Relative 3  0 - 5 (%)    Eosinophils Absolute 0.2  0.0 - 0.7 (K/uL)    Basophils Relative 1  0 - 1 (%)    Basophils Absolute 0.0  0.0 - 0.1 (K/uL)   PROTIME-INR     Status: Abnormal   Collection Time   06/08/11  6:50 AM      Component Value Range Comment   Prothrombin Time 16.1 (*) 11.6 - 15.2 (seconds)    INR 1.26  0.00 - 1.49      Physical Exam  Nursing note and vitals reviewed.  Constitutional: He appears well-developed.    HENT: PERRL, EOMI  Head: Normocephalic.  Neck: Normal range of motion. No thyromegaly present.  Cardiovascular: Normal rate.  Pulmonary/Chest: Breath sounds normal. He has no wheezes.  Abdominal: He exhibits no distension. There is no tenderness.  Musculoskeletal: He exhibits no edema.  RLE with 1+ edema  Neurological: He is alert.  Pt alert. Follows simple commnds, needing cueing at times. Right facial droop and tongue deviation. Speech dysarthric but Intelligible. He has mild verbal and motor apraxia.. Patient is oriented x3 with good recall. Diminished pain sense on the right side.Light touch intact RUE 4/5. RLE 0-1/5. Mild right pronator drift and decreased FMC on right. DTR's 3+ on right. Toes up.  Skin: Skin is warm and dry     Assessment/Plan: 1. Functional deficits secondary to L ACA infarct with RLE paresis which require 3+ hours per day of interdisciplinary therapy in a comprehensive inpatient rehab setting. Physiatrist is providing close  team supervision and 24 hour management of active medical problems listed below. Physiatrist and rehab team continue to assess barriers to discharge/monitor patient progress toward functional and medical goals. FIM:       FIM - Toileting Toileting steps completed by patient: Adjust clothing prior to toileting Toileting Assistive Devices: Grab bar or rail for support Toileting: 6: More than reasonable amount of time           Comprehension Comprehension Mode: Auditory Comprehension: 5-Understands complex 90% of the time/Cues < 10% of the time  Expression Expression Mode: Verbal Expression: 5-Expresses complex 90% of the time/cues < 10% of the time  Social Interaction Social Interaction: 5-Interacts appropriately 90% of the time - Needs monitoring or encouragement for participation or interaction.  Problem Solving Problem Solving: 5-Solves complex 90% of the time/cues < 10% of the time  Memory Memory: 5-Recognizes or  recalls 90% of the time/requires cueing < 10% of the time  Medical Problem List and Plan:  1. 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 DVT Prophylaxis/Anticoagulation: Chronic Coumadin therapy. Monitor for any signs of bleeding  3. Recurrent seizure. Continue Dilantin and Keppra therapy as per neurology services. Will need followup Dilantin level in next couple days. Monitor for any further seizure activity.  4. Dysphagia. Followup per speech therapy.Pt advanced to D3 diet. Monitor for any signs of aspiration  5. Atrial flutter with RVR. Continue Cardizem and follow up cardiology services. HR controlled at present.  6.  Loose stools adjust bowel meds  LOS (Days) 1 A FACE TO FACE EVALUATION WAS PERFORMED  [name] 06/08/2011, 7:17 AM    Review of Systems  Gastrointestinal: Positive for diarrhea. Negative for nausea, vomiting and abdominal pain.  Neurological: Positive for focal weakness.  All other systems reviewed and are negative.

## 2011-06-08 NOTE — Progress Notes (Signed)
Patient information reviewed and entered into [facility] system by Curlie Macken, RN, CRRN, PPS Coordinator.  Information including medical coding and functional independence measure will be reviewed and updated through discharge.     Per nursing patient was given "Data Collection Information Summary for Patients in Inpatient Rehabilitation Facilities with attached "Privacy Act Statement-Health Care Records" upon admission.   

## 2011-06-08 NOTE — Progress Notes (Signed)
Social Work Assessment and Plan Social Work Assessment and Plan  Patient Details  Name: [name] MRN: [id number] Date of Birth: September 12, 1940  Today's Date: 06/08/2011  Problem List:  Patient Active Problem List  Diagnoses  . ANEMIA, DEFICIENCY NOS  . DEMENTIA  . ABUSE, ALCOHOL, CONTINUOUS  . ORGANIC BRAIN SYNDROME  . HYPERTENSION  . CVA  . SINUSITIS, CHRONIC NEC  . ALLERGIC RHINITIS  . CIRRHOSIS, ALCOHOLIC, LIVER  . ERECTILE DYSFUNCTION, ORGANIC  . SEIZURE DISORDER  . ABNORMAL RESULT, FUNCTION STUDY, LIVER  . CEREBROVASCULAR ACCIDENT, HX OF  . PANCREATITIS, HX OF  . Status epilepticus  . Atrial flutter  . CVA (cerebral infarction)   Past Medical History:  Past Medical History  Diagnosis Date  . Seizures   . Stroke    Past Surgical History: No past surgical history on file. Social History:  reports that he has never smoked. He has never used smokeless tobacco. He reports that he does not drink alcohol or use illicit drugs.  Family / Support Systems Marital Status: Widow/Widower Patient Roles: Parent;Other (Comment) [employer] Member) Children: [name]  [phone] Other Supports: [name]  [phone] Anticipated Caregiver: Kendra-daughter Ability/Limitations of Caregiver: Daughter committed to pt and will stay here with him or give him the option to go to [place] where she lives Caregiver Availability: 24/7 Family Dynamics: Pt and daughter are very close and committed to one another.  Daughter is willing to do what pt needs and is aware he may require 24 hour care at discharge.  Social History Preferred language: English Religion: Christian Cultural Background: No issues Education: Some Museum/gallery exhibitions officer: Yes Write: Yes Employment Status: Retired Fish farm manager Issues: No issues Guardian/Conservator: None   Abuse/Neglect Physical Abuse: Denies Verbal Abuse: Denies Sexual Abuse: Denies Exploitation of  patient/patient's resources: Denies Self-Neglect: Denies  Emotional Status Pt's affect, behavior adn adjustment status: Pt's speech has been affected by this stroke.  he lets daughter answer for him.  She reports he has always been independent and self sufficient and wants to remain so.  He has a strong work Associate Professor and will do his best while here. Recent Psychosocial Issues: other medical issues, but remained independent and managed them himself Pyschiatric History: No history  deferred Beck Depression Screen until speech improves, but will monitor him while here.  Daughter reports pt has always been optimisitic and has a strong faith to pull him through. Substance Abuse History: No issues  Patient / Family Perceptions, Expectations & Goals Pt/Family understanding of illness & functional limitations: Daughter can explain his stroke and deficits.  She is anxious to have him start therapies, due to she states: " He really has not had any sicne this stroke."  He is on bedrest for low potassium.  She has started formulating a discharge plan for him, relalizing he may need care at discharge. 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  Pt reports: " Do for self."  Pt is motivated to begin rehab.  Community Resources [employer]: None Premorbid Home Care/DME Agencies: None Transportation available at discharge: [employer] referrals recommended: Support group (specify) (CVA Support Group)  Discharge Planning Living Arrangements: Alone Support Systems: Children;Family members;Church/faith community;Friends/neighbors Type of Residence: Private residence Insurance Resources: Administrator  (specify) Financial Resources: Social Security  Financial Screen Referred: No Living Expenses: Own Money Management: Patient Do you have any problems obtaining your medications?: No Home Management: Did own cooking, had help with cleaning Patient/Family Preliminary Plans: Return home with daughter to his home or go to her home in [place].  Plan depends upon the progress pt makes here and what he wants to do.  Daughter can work from home if necessary. Social Work Anticipated Follow Up Needs: HH/OP;Support Group DC Planning Additional Notes/Comments: Discussed with daughter pt will require 24 hour care and to plan for this, that way if he doesn't then can scale back.  Daughter is an only child and wants to do the best for her Dad.  Clinical Impression Pleasant gentleman laying in the bed. Daughter is here working on her computer.  Seems to realize pt will require care at discharge and is formulating a plan for home.  She plans to be here but at times needs To go back to [place] every couple of days.  Lucy Chris 06/08/2011, 11:40 AM

## 2011-06-08 NOTE — Progress Notes (Signed)
[facility]patient Rehabilitation Center Individual Statement of Services  Patient Name:  [name]  Date:  06/08/2011  Welcome to the [facility]patient [facility].  Our goal is to provide you with an individualized program based on your diagnosis and situation, designed to meet your specific needs.  With this comprehensive rehabilitation program, you will be expected to participate in at least 3 hours of rehabilitation therapies Monday-Friday, with modified therapy programming on the weekends.  Your rehabilitation program will include the following services:  Physical Therapy (PT), Occupational Therapy (OT), Speech Therapy (ST), 24 hour per day rehabilitation nursing, Therapeutic Recreaction (TR), Case Management (RN and Child psychotherapist), Rehabilitation Medicine, Nutrition Services and Pharmacy Services  Weekly team conferences will be held on Wednesdays to discuss your progress.  Your RN Case Designer, television/film set will talk with you frequently to get your input and to update you on team discussions.  Team conferences with you and your family in attendance may also be held.  Expected length of stay: 2-3 weeks  Overall anticipated outcome: supervision  Depending on your progress and recovery, your program may change.  Your RN Case Estate agent will coordinate services and will keep you informed of any changes.  Your RN Sports coach and SW names and contact numbers are listed  below.  The following services may also be recommended but are not provided by the Inpatient Rehabilitation Center:   Driving Evaluations  Home Health Rehabiltiation Services  Outpatient Rehabilitatation [facility]  Vocational Rehabilitation   Arrangements will be made to provide these services after discharge if needed.  Arrangements include referral to agencies that provide these services.  Your insurance has been verified to be:  Medicare + BCBS Your primary doctor is: Dr Merlene Laughter  Pertinent information will be shared with your doctor and your insurance company.  Case Manager: Lutricia Horsfall, [facility] [phone]  Social Worker:  Dossie Der, [place] [phone]  Information discussed with and copy given to patient by: [name], 06/08/2011

## 2011-06-08 NOTE — Progress Notes (Signed)
ANTICOAGULATION CONSULT NOTE - FOLLOW UP  Pharmacy Consult: Coumadin Indication: atrial fibrillation  No Known Allergies  Patient Measurements: Height: 6\' 1"  (185.4 cm) Weight: 174 lb 13.2 oz (79.3 kg) IBW/kg (Calculated) : 79.9   Vital Signs: Temp: 98.8 F (37.1 C) (03/19 0500) Temp src: Oral (03/19 0500) BP: 111/63 mmHg (03/19 0500) Pulse Rate: 86  (03/19 0500)  Labs:  Basename 06/08/11 0650 06/07/11 1010  HGB 9.7* --  HCT 28.6* --  PLT 144* --  APTT -- --  LABPROT 16.1* 15.3*  INR 1.26 1.18  HEPARINUNFRC -- --  CREATININE 0.64 --  CKTOTAL -- --  CKMB -- --  TROPONINI -- --   Estimated Creatinine Clearance: 93.6 ml/min (by C-G formula based on Cr of 0.64).  Medical History: Past Medical History  Diagnosis Date  . Seizures   . Stroke     Medications:  Scheduled:     . diltiazem  120 mg Oral Daily  . levETIRAcetam  1,750 mg Oral BID  . phenytoin  134 mg Oral Q8H  . potassium chloride  10 mEq Intravenous Q1 Hr x 6  . warfarin  5 mg Oral ONCE-1800  . Warfarin - Pharmacist Dosing Inpatient   Does not apply q1800  . DISCONTD: phenytoin  400 mg Oral QHS  . DISCONTD: senna  1 tablet Oral BID    Assessment: 71 YOM s/p stroke, with atrial fibrillation requiring initiation of Coumadin for further stroke prevention. Noted drug interaction with Dilantin -- Coumadin metabolism will inhibited initially then induced so expect INR to rapidly increase then become stable.   INR trending up but remains subtherapeutic.  Goal of Therapy:  INR 2-3    Plan:  - Coumadin 5mg  PO today - Daily PT/INR - Consider DPH level in one week (06/15/11)   Phillips Climes, PharmD 06/08/2011 12:22 PM

## 2011-06-08 NOTE — Progress Notes (Signed)
SLP Cancellation Note  Evaluation cancelled today due to medical issues with patient which prohibited therapy.  Will follow up for evaluation when MD permits.  Linard Daft 06/08/2011, 9:45 AM

## 2011-06-08 NOTE — Progress Notes (Signed)
INITIAL ADULT NUTRITION ASSESSMENT Date: 06/08/2011   Time: 2:39 PM  Reason for Assessment: Nutrition Risk Report  ASSESSMENT: Male 71 y.o.  Dx: CVA (cerebral infarction)  Hx:  Past Medical History  Diagnosis Date  . Seizures   . Stroke    Related Meds:     . diltiazem  120 mg Oral Daily  . levETIRAcetam  1,750 mg Oral BID  . phenytoin  134 mg Oral Q8H  . potassium chloride  10 mEq Intravenous Q1 Hr x 6  . warfarin  5 mg Oral ONCE-1800  . warfarin  5 mg Oral ONCE-1800  . Warfarin - Pharmacist Dosing Inpatient   Does not apply q1800  . DISCONTD: phenytoin  400 mg Oral QHS  . DISCONTD: senna  1 tablet Oral BID   Ht: 6\' 1"  (185.4 cm)  Wt: 174 lb 13.2 oz (79.3 kg)  Ideal Wt: 83.6 kg % Ideal Wt: 95%  Wt Readings from Last 5 Encounters:  06/07/11 174 lb 13.2 oz (79.3 kg)  06/02/11 174 lb 13.2 oz (79.3 kg)  11/17/07 164 lb (74.39 kg)  07/20/07 180 lb (81.647 kg)  04/21/07 177 lb (80.287 kg)  Usual Wt: 165 lb per pt % Usual Wt: 105%  Body mass index is 23.07 kg/(m^2). Weight is WNL.  Food/Nutrition Related Hx: regular diet PTA  Labs:  CMP     Component Value Date/Time   NA 141 06/08/2011 1205   K 2.9* 06/08/2011 1205   CL 105 06/08/2011 1205   CO2 26 06/08/2011 1205   GLUCOSE 102* 06/08/2011 1205   BUN 11 06/08/2011 1205   CREATININE 0.63 06/08/2011 1205   CALCIUM 8.8 06/08/2011 1205   PROT 5.5* 06/08/2011 0650   ALBUMIN 2.7* 06/08/2011 0650   AST 47* 06/08/2011 0650   ALT 46 06/08/2011 0650   ALKPHOS 69 06/08/2011 0650   BILITOT 0.4 06/08/2011 0650   GFRNONAA >90 06/08/2011 1205   GFRAA >90 06/08/2011 1205   Lipid Panel     Component Value Date/Time   CHOL 156 06/04/2011 0500   TRIG 86 06/04/2011 0500   HDL 97 06/04/2011 0500   CHOLHDL 1.6 06/04/2011 0500   VLDL 17 06/04/2011 0500   LDLCALC 42 06/04/2011 0500   Intake/Output: I/O last 3 completed shifts: In: 240 [P.O.:240] Out: -  Total I/O In: 480 [P.O.:480] Out: -   Diet Order: Dysphagia 3 with thin  liquids  Supplements/Tube Feeding: none  IVF:    Estimated Nutritional Needs:   Kcal:  1700 - 1800 kcal Protein:  75 - 85 grams Fluid:  1.7 - 1.9 L/d  RD drawn to chart 2/2 Nutrition Risk Report.   Pt admitted to rehab s/p seizure and CVA. MBS on 3/18 recommending Dysphagia 1 diet with thin liquids. Advanced to Dysphagia 3 diet today.  Discussed PO intake with pt and daughter at bedside. Pt states that his appetite was poor while in the acute inpatient hospital setting. Daughter confirms this. Both pt and daughter state that appetite is now improving as pt is not only feeling better with appetite returning, but also is eating better with upgraded diet to Dysphagia 3 diet. Intake is approximately 50%.  Pt suspects some level of weight loss during acute hospitalization 2/2 poor intake, but unable to quantify how much. He states he was drinking chocolate Ensure but does not like it. Declined other supplements offered to him by this RD. Pt suspects his intake will improve with each day that he is here.  NUTRITION DIAGNOSIS: -  Inadequate oral intake (NI-2.1).  Status: Ongoing  RELATED TO: variable appetite  AS EVIDENCE BY: pt report  MONITORING/EVALUATION(Goals): Goal: Pt to consume >/= 75% of meals. Unmet. Monitor: PO intake, weights, labs, I/O's  EDUCATION NEEDS: -No education needs identified at this time  INTERVENTION: 1. Discussed use of alternative selections portion of menu to encourage variety of foods and promote better PO intake. 2. Pt declined supplements at this time 3. RD to continue to follow  Dietitian #: [phone]  DOCUMENTATION CODES Per approved criteria  -Not Applicable    Adair Laundry 06/08/2011, 2:39 PM

## 2011-06-09 LAB — BASIC METABOLIC PANEL
BUN: 10 mg/dL (ref 6–23)
Chloride: 106 mEq/L (ref 96–112)
GFR calc Af Amer: >90 mL/min (ref >90)
Glucose, Bld: 111 mg/dL — ABNORMAL HIGH (ref 70–99)
Potassium: 2.8 mEq/L — ABNORMAL LOW (ref 3.5–5.1)

## 2011-06-09 MED ORDER — POTASSIUM CHLORIDE CRYS ER 20 MEQ PO TBCR
40.0000 meq | EXTENDED_RELEASE_TABLET | Freq: Once | ORAL | Status: AC
  Administered 2011-06-09: 40 meq via ORAL
  Filled 2011-06-09: qty 2

## 2011-06-09 MED ORDER — POTASSIUM CHLORIDE CRYS ER 20 MEQ PO TBCR
20.0000 meq | EXTENDED_RELEASE_TABLET | Freq: Two times a day (BID) | ORAL | Status: DC
  Administered 2011-06-09 – 2011-06-10 (×4): 20 meq via ORAL
  Filled 2011-06-09 (×7): qty 1

## 2011-06-09 MED ORDER — POTASSIUM CHLORIDE CRYS ER 20 MEQ PO TBCR
40.0000 meq | EXTENDED_RELEASE_TABLET | Freq: Two times a day (BID) | ORAL | Status: DC
  Filled 2011-06-09 (×3): qty 2

## 2011-06-09 MED ORDER — WARFARIN SODIUM 7.5 MG PO TABS
7.5000 mg | ORAL_TABLET | Freq: Once | ORAL | Status: AC
  Administered 2011-06-09: 7.5 mg via ORAL
  Filled 2011-06-09: qty 1

## 2011-06-09 MED ORDER — POTASSIUM CHLORIDE CRYS ER 20 MEQ PO TBCR
20.0000 meq | EXTENDED_RELEASE_TABLET | Freq: Two times a day (BID) | ORAL | Status: DC
  Filled 2011-06-09 (×3): qty 1

## 2011-06-09 NOTE — Evaluation (Signed)
Occupational Therapy Assessment and Plan  Patient Details  Name: [name] MRN: [id number] Date of Birth: 02-Jan-1941  OT Diagnosis: hemiplegia affecting dominant side, muscle weakness (generalized) and swelling of limb Rehab Potential: Rehab Potential: Good ELOS: 2- 2 1/2 weeks   Today's Date: 06/09/2011 Time: 0930-1030  Time Calculation (min): 60 min  Problem List:  Patient Active Problem List  Diagnoses  . ANEMIA, DEFICIENCY NOS  . DEMENTIA  . ABUSE, ALCOHOL, CONTINUOUS  . ORGANIC BRAIN SYNDROME  . HYPERTENSION  . CVA  . SINUSITIS, CHRONIC NEC  . ALLERGIC RHINITIS  . CIRRHOSIS, ALCOHOLIC, LIVER  . ERECTILE DYSFUNCTION, ORGANIC  . SEIZURE DISORDER  . ABNORMAL RESULT, FUNCTION STUDY, LIVER  . CEREBROVASCULAR ACCIDENT, HX OF  . PANCREATITIS, HX OF  . Status epilepticus  . Atrial flutter  . CVA (cerebral infarction)    Past Medical History:  Past Medical History  Diagnosis Date  . Seizures   . Stroke    Past Surgical History: No past surgical history on file.  Assessment & Plan Clinical Impression: Patient is a 71 y.o. year old male with recent admission to the hospital on March 13 due to seizure. It was unclear how long the patient had been seizing;was found down by family with seizure activity and right-sided weakness. Patient has been maintained on Dilantin and Keppra. His last noted seizure had been 2009. MRI of the brain showed acute nonhemorrhagic infarction in the medial left frontal lobe as well as stable remote hemorrhagic infarct of the posterior left insular cortex and superior left temporal lobe. MRA of the neck showed moderate irregularity proximal left common carotid artery estimated 50-75%. Severe narrowing left subclavian distal to the vertebral origin estimated 75-90%. He did not receive TPA. Echocardiogram with ejection fraction of 65% without emboli or wall motion abnormality. .  Patient transferred to CIR on 06/07/2011 .    Patient currently  requires max with basic self-care skills secondary to abnormal tone, unbalanced muscle activation and decreased coordination.  Prior to hospitalization, patient could complete self-cares with independence.  Patient will benefit from skilled intervention to decrease level of assist with basic self-care skills and increase independence with basic self-care skills prior to discharge home with care partner/daughter can provide short term supervision but needs to return to [place] every few days.  Anticipate patient will require intermittent supervision and follow up outpatient (TBD).  OT - End of Session Activity Tolerance: Tolerates 30+ min activity without fatigue OT Assessment Rehab Potential: Good Barriers to Discharge: Decreased caregiver support Barriers to Discharge Comments: daughter can stay with pt, does need to return to [place] every few days OT Plan OT Frequency: 1-2 X/day, 60-90 minutes Estimated Length of Stay: 2- 2 1/2 weeks OT Treatment/Interventions: Balance/vestibular training;Discharge planning;DME/adaptive equipment instruction;Functional mobility training;Patient/family education;Neuromuscular re-education;Self Care/advanced ADL retraining;Therapeutic Activities;Therapeutic Exercise;UE/LE Strength taining/ROM;UE/LE Coordination activities OT Recommendation Follow Up Recommendations:  (TBD) Equipment Recommended: Tub/shower seat (TBD)  OT Evaluation Precautions/Restrictions  Precautions Precautions: Fall Precaution Comments: significant RLE weakness Required Braces or Orthoses: No Restrictions Weight Bearing Restrictions: No General Chart Reviewed: Yes Family/Caregiver Present: No Vital Signs   Pain Pain Assessment Pain Assessment: No/denies pain Pain Score: 0-No pain Home Living/Prior Functioning Home Living Lives With: Alone Receives Help From: Family (daughter either contacts or visits her dad everyday) Type of Home: House Bathroom Shower/Tub: Walk-in  Soil scientist Toilet: Standard Home Adaptive Equipment: None Prior Function Level of Independence: Independent with basic ADLs;Independent with homemaking with ambulation;Independent with gait Driving: Yes Vocation: Retired ADL  ADL Grooming: Setup;Minimal assistance Where Assessed-Grooming: Sitting at sink Upper Body Bathing: Setup Where Assessed-Upper Body Bathing: Edge of bed Lower Body Bathing: Maximal assistance Where Assessed-Lower Body Bathing: Edge of bed Upper Body Dressing: Setup Where Assessed-Upper Body Dressing: Edge of bed Lower Body Dressing: Maximal assistance Where Assessed-Lower Body Dressing: Edge of bed Vision/Perception  Vision - History Baseline Vision: Wears glasses only for reading Patient Visual Report: No change from baseline Vision - Assessment Eye Alignment: Within Functional Limits Vision Assessment: Vision tested Perception Perception: Within Functional Limits Praxis Praxis: Intact  Cognition Arousal/Alertness: Awake/alert Orientation Level: Oriented X4 Attention: Sustained Problem Solving Impairment: Verbal basic;Functional basic Sequencing: Impaired Self Correcting: Impaired Safety/Judgment: Impaired Sensation Sensation Light Touch: Appears Intact Stereognosis: Not tested Hot/Cold: Not tested Proprioception: Appears Intact Coordination Gross Motor Movements are Fluid and Coordinated: No Fine Motor Movements are Fluid and Coordinated: No Coordination and Movement Description: slight delay in RUE, but WFL for basic self-cares Motor    Mobility  Bed Mobility Supine to Sit: 4: Min assist Transfers Transfers: Yes Sit to Stand: 3: Mod assist;1: +2 Total assist;From bed;With upper extremity assist Sit to Stand Details: Verbal cues for sequencing;Verbal cues for precautions/safety;Manual facilitation for weight shifting Stand to Sit: With upper extremity assist;3: Mod assist  Trunk/Postural Assessment     Balance   Extremity/Trunk  Assessment RUE Assessment RUE Assessment: Within Functional Limits (strength 4/5) LUE Assessment LUE Assessment: Within Functional Limits  See FIM for current functional status Refer to Care Plan for Long Term Goals  Recommendations for other services: None  Discharge Criteria: Patient will be discharged from OT if patient refuses treatment 3 consecutive times without medical reason, if treatment goals not met, if there is a change in medical status, if patient makes no progress towards goals or if patient is discharged from hospital.  The above assessment, treatment plan, treatment alternatives and goals were discussed and mutually agreed upon: by patient  Treatment Session OT eval, ADL assessment.  Pt very determined to progress quickly to return home.  Required mod assist +2 sit to stand to complete LB bathing and dressing secondary to RLE weakness.  Pt with noted deficits initially with initiation and motor planning with bathing.  Pt with expressive difficulties manifested in difficulty with word finding.    [name] 06/09/2011, 1:18 PM

## 2011-06-09 NOTE — Progress Notes (Signed)
Physical Therapy Note  Patient Details  Name: [name] MRN: [id number] Date of Birth: 03-29-1940 Today's Date: 06/09/2011, late entry for 06/08/11    Evaluation not done on 06/08/11 due to pt on medical hold, due to low K+.  0 minutes, 0 charges

## 2011-06-09 NOTE — Progress Notes (Signed)
ANTICOAGULATION CONSULT NOTE - FOLLOW UP  Pharmacy Consult: Coumadin Indication: atrial fibrillation  No Known Allergies  Patient Measurements: Height: 6\' 1"  (185.4 cm) Weight: 174 lb 13.2 oz (79.3 kg) IBW/kg (Calculated) : 79.9   Vital Signs: Temp: 98.7 F (37.1 C) (03/20 0700) Temp src: Oral (03/20 0700) BP: 121/72 mmHg (03/20 0700) Pulse Rate: 91  (03/20 0700)  Labs:  Basename 06/09/11 0617 06/08/11 1205 06/08/11 0650 06/07/11 1010  HGB -- -- 9.7* --  HCT -- -- 28.6* --  PLT -- -- 144* --  APTT -- -- -- --  LABPROT 18.3* -- 16.1* 15.3*  INR 1.49 -- 1.26 1.18  HEPARINUNFRC -- -- -- --  CREATININE 0.69 0.63 0.64 --  CKTOTAL -- -- -- --  CKMB -- -- -- --  TROPONINI -- -- -- --   Estimated Creatinine Clearance: 93.6 ml/min (by C-G formula based on Cr of 0.69).  Medical History: Past Medical History  Diagnosis Date  . Seizures   . Stroke     Medications:  Scheduled:     . diltiazem  120 mg Oral Daily  . levETIRAcetam  1,750 mg Oral BID  . phenytoin  134 mg Oral Q8H  . potassium chloride  10 mEq Intravenous Q1 Hr x 6  . potassium chloride  20 mEq Oral BID  . warfarin  5 mg Oral ONCE-1800  . warfarin  5 mg Oral ONCE-1800  . Warfarin - Pharmacist Dosing Inpatient   Does not apply q1800  . DISCONTD: potassium chloride  20 mEq Oral BID  . DISCONTD: potassium chloride  40 mEq Oral BID    Assessment: 71 YOM s/p stroke, with atrial fibrillation requiring initiation of Coumadin for further stroke prevention. Noted drug interaction with Dilantin -- Coumadin metabolism will inhibited initially then induced so expect INR to rapidly increase then become stable.   INR trending up but remains subtherapeutic.    Goal of Therapy:  INR 2-3    Plan:  - Coumadin 7.5mg  PO today - Daily PT/INR - Consider DPH level in one week (06/15/11)   Phillips Climes, PharmD 06/09/2011 11:37 AM

## 2011-06-09 NOTE — Evaluation (Signed)
Speech Language Pathology Assessment and Plan & Bedside Swallow Evaluation  Patient Details  Name: [name] MRN: [id number] Date of Birth: Nov 11, 1940  SLP Diagnosis: dysphagia, cognitive deficits, aphasia  Rehab Potential: Good ELOS:  2 weeks  Today's Date: 06/09/2011 Time: 1400-1500 Time Calculation (min): 60 min  Problem List:  Patient Active Problem List  Diagnoses  . ANEMIA, DEFICIENCY NOS  . DEMENTIA  . ABUSE, ALCOHOL, CONTINUOUS  . ORGANIC BRAIN SYNDROME  . HYPERTENSION  . CVA  . SINUSITIS, CHRONIC NEC  . ALLERGIC RHINITIS  . CIRRHOSIS, ALCOHOLIC, LIVER  . ERECTILE DYSFUNCTION, ORGANIC  . SEIZURE DISORDER  . ABNORMAL RESULT, FUNCTION STUDY, LIVER  . CEREBROVASCULAR ACCIDENT, HX OF  . PANCREATITIS, HX OF  . Status epilepticus  . Atrial flutter  . CVA (cerebral infarction)    Past Medical History:  Past Medical History  Diagnosis Date  . Seizures   . Stroke    Past Surgical History: No past surgical history on file.  Assessment & Plan Clinical Impression: Patient is a 71 year old right-handed African American male with history of seizures as well as stroke that was found down by family with seizure activity and right-sided weakness. He was admitted March 13 due to seizure. It was unclear how long the patient had been seizing. Patient has been maintained on Dilantin and Keppra. His last noted seizure had been 2009. MRI of the brain showed acute nonhemorrhagic infarction in the medial left frontal lobe as well as stable remote hemorrhagic infarct of the posterior left insular cortex and superior left temporal lobe.  Patient transferred to CIR on 06/07/2011 and upon evaluation presents with oral/pharyngeal dysphagia, cognitive deficits with decreased initiation and sustained attention, as well as receptive and expressive language deficits (some baseline; worse now).  As a result, this patient would benefit from skilled SLP services to maximize functional  independence with BADLs and reduce burden of care upon discharge.    SLP - End of Session Patient left: in chair;with call bell in reach Nurse Communication: Cognitive/Linguistic strategies reviewed Assessment SLP Recommendation/Assessment: Patient will need skilled Speech Lanaguage Pathology Services during CIR admission Rehab Potential: Good Barriers to Discharge: Decreased caregiver support;Other (comment) (recommending supervision upon discharge) Therapy Diagnosis: Cognitive Impairments;Speech and Language deficits SLP Plan SLP Frequency: 1-2 X/day, 30-60 minutes;5 out of 7 days SLP Treatment/Interventions: Cognitive remediation/compensation;Cueing hierarchy;Environmental controls;Functional tasks;Internal/external aids;Medication managment;Patient/family education;Speech/Language facilitation Recommendation Follow up Recommendations: Outpatient SLP Equipment Recommended: None recommended by SLP  SLP Evaluation Precautions/Restrictions  Precautions Precautions: Fall Precaution Comments: Dys. 3 textures and thin liquids; significant RLE weakness Required Braces or Orthoses: No Restrictions Weight Bearing Restrictions: No General    Pain Pain Assessment Pain Assessment: No/denies pain Pain Score: 0-No pain Prior Functioning Cognitive/Linguistic Baseline: Baseline deficits Baseline deficit details: Word finding, processing, higher level cognitive functiona per daughter's descriptions with waxing/waning day to day. Type of Home: House Lives With: Alone Receives Help From: Family Vocation: Retired IT consultant Overall Cognitive Status: Impaired Arousal/Alertness: Awake/alert Orientation Level: Oriented X4 Attention: Sustained Sustained Attention: Impaired Sustained Attention Impairment: Verbal basic;Functional basic Memory: Impaired Memory Impairment: Storage deficit;Retrieval deficit;Decreased recall of new information;Decreased long term memory;Decreased short term  memory;Prospective memory Decreased Long Term Memory: Verbal basic Decreased Short Term Memory: Verbal basic;Functional basic Awareness: Impaired Awareness Impairment: Emergent impairment Problem Solving: Impaired Problem Solving Impairment: Verbal basic;Functional basic Executive Function: Reasoning;Sequencing;Organizing;Decision Making;Initiating;Self Monitoring;Self Correcting Reasoning: Impaired Reasoning Impairment: Verbal basic Sequencing: Impaired Sequencing Impairment: Functional basic Organizing: Impaired Organizing Impairment: Functional basic;Verbal basic Decision Making: Impaired Initiating: Impaired  Initiating Impairment: Functional basic Self Monitoring: Impaired Self Monitoring Impairment: Functional basic;Verbal basic Self Correcting: Impaired Self Correcting Impairment: Verbal basic;Functional basic Safety/Judgment: Impaired Comprehension Auditory Comprehension Overall Auditory Comprehension: Impaired Yes/No Questions: Impaired Complex Questions: 75-100% accurate (80%) Commands: Impaired Two Step Basic Commands: 50-74% accurate Interfering Components: Attention;Processing speed;Working Radio broadcast assistant: Extra processing time;Visual/Gestural cues;Other (Comment) (written aids) Visual Recognition/Discrimination Discrimination: Not tested Reading Comprehension Reading Status: Not tested Expression Expression Primary Mode of Expression: Verbal Verbal Expression Overall Verbal Expression: Impaired Level of Generative/Spontaneous Verbalization: Phrase;Sentence Repetition: No impairment Verbal Errors: Not aware of errors (word finding and circumlocution during verbal expression ) Pragmatics: Impairment Impairments: Interpretation of nonverbal communication;Topic maintenance Interfering Components: Premorbid deficit Written Expression Dominant Hand: Right Written Expression: Within Functional Limits Dictation Ability:  (check writing  task) Oral/Motor Oral Motor/Sensory Function Overall Oral Motor/Sensory Function: Impaired Labial ROM: Reduced right Labial Symmetry: Abnormal symmetry right Labial Strength: Reduced Lingual ROM: Reduced right Lingual Strength: Reduced Facial ROM: Reduced right Motor Speech Overall Motor Speech: Appears within functional limits for tasks assessed (basic expression ) Respiration: Within functional limits Resonance: Within functional limits Articulation: Within functional limitis Intelligibility: Intelligible Motor Planning: Not tested Interfering Components: Hearing loss (sister reports HOH)  See FIM for current functional status Refer to Care Plan for Long Term Goals  Recommendations for other services: None  Discharge Criteria: Patient will be discharged from SLP if patient refuses treatment 3 consecutive times without medical reason, if treatment goals not met, if there is a change in medical status, if patient makes no progress towards goals or if patient is discharged from hospital.  The above assessment, treatment plan, treatment alternatives and goals were discussed and mutually agreed upon: by patient   Clinical/Bedside Swallow Evaluation Patient Details  Name: [name] MRN: [id number] DOB: Dec 12, 1940 Today's Date: 06/09/2011  Past Medical History:  Past Medical History  Diagnosis Date  . Seizures   . Stroke    Past Surgical History: No past surgical history on file. HPI: see above for information     Assessment/Recommendations/Treatment Plan    SLP Assessment Clinical Impression Statement: Patient demonstrates mild oral/pharyngeal sensory motor based dysphagia characterized by prolonged mastication, oral residue, pharyngeal residue per MBSS which result in throat clears and coughs due to decreased recall and carryover of compensatory strategies as well as impulsivity with self feeding.   Risk for Aspiration: Mild Other Related Risk Factors: Previous  CVA;Cognitive impairment  Swallow Evaluation Recommendations Solid Consistency: Dysphagia 3 (Mechanical soft) Liquid Consistency: Thin Liquid Administration via: Cup;No straw Medication Administration: Whole meds with puree Supervision: Full supervision/cueing for compensatory strategies Compensations: Slow rate;Small sips/bites;Multiple dry swallows after each bite/sip Postural Changes and/or Swallow Maneuvers: Seated upright 90 degrees Oral Care Recommendations: Oral care BID Follow up Recommendations: Outpatient SLP  Treatment Plan Treatment Plan Recommendations: Therapy as outlined in treatment plan below Speech Therapy Frequency: min 5x/week Treatment Duration: 2 weeks Interventions: Aspiration precaution training;Compensatory techniques;Patient/family education;Trials of upgraded texture/liquids;Diet toleration management by SLP;Group dysphagia treatment  Prognosis Prognosis for Safe Diet Advancement: Good Barriers to Reach Goals: Cognitive deficits  Individuals Consulted Consulted and Agree with Results and Recommendations: Patient  Swallowing Goals  SLP Swallowing Goals Patient will consume recommended diet without observed clinical signs of aspiration with: Supervision/safety Patient will utilize recommended strategies during swallow to increase swallowing safety with: Supervision/safety  Swallow Study Prior Functional Status  Cognitive/Linguistic Baseline: Baseline deficits Baseline deficit details: Word finding, processing, higher level cognitive functiona per daughter's descriptions with waxing/waning day to day. Type of  Home: House Lives With: Alone Receives Help From: Family Vocation: Retired  Oral Motor/Sensory Function  Overall Oral Motor/Sensory Function: Impaired Labial ROM: Reduced right Labial Symmetry: Abnormal symmetry right Labial Strength: Reduced Lingual ROM: Reduced right Lingual Strength: Reduced Facial ROM: Reduced right  Consistency  Results  Ice Chips Ice chips: Not tested  Thin Liquid Thin Liquid: Impaired (with large, consecutive sips) Presentation: Cup Oral Phase Impairments: Reduced labial seal;Reduced lingual movement/coordination Oral Phase Functional Implications: Right anterior spillage Pharyngeal  Phase Impairments: Multiple swallows;Delayed Swallow;Throat Clearing - Immediate Other Comments: cues for slow, single sips were WFL  Nectar Thick Liquid Nectar Thick Liquid: Not tested  Honey Thick Liquid Honey Thick Liquid: Not tested  Puree Puree: Not tested  Solid Solid: Impaired Presentation: Self Fed Oral Phase Functional Implications: Oral residue Pharyngeal Phase Impairments: Delayed Swallow;Multiple swallows;Throat Clearing - Immediate Other Comments: cues to take single bites followed with an extra swallow were WFL with Dys.3 textures  Fae Pippin, M.A., CCC-SLP [phone]

## 2011-06-09 NOTE — Progress Notes (Signed)
Met with pt to report on team conference. Pt voiced concerns regarding LOS of 2-3 weeks and supervision goals. Pt states he hopes to go home sooner and to live alone again. Explained rationale for setting supervision goals, for safety and encouraged pt to focus on short term goals in therapy. Called pt's daughter in [place] to report on team conference. Explained supervision goals and that a d/c date would be set next Wed. She verbalized understanding.

## 2011-06-09 NOTE — Evaluation (Signed)
Recreational Therapy Assessment and Plan  Patient Details  Name: [name] MRN: [id number] Date of Birth: [birth year] Today's Date: 06/09/2011  Rehab Potential: Good  ELOS: 2-3 weeks  Assessment Clinical Impression: Patient is a 71 y.o. year old male with recent admission to the hospital with history of seizures as well as stroke that was found down by family with seizure activity and right-sided weakness. He was admitted March 13 due to seizure. It was unclear how long the patient had been seizing. Patient has been maintained on Dilantin and Keppra. His last noted seizure had been 2009. MRI of the brain showed acute nonhemorrhagic infarction in the medial left frontal lobe as well as stable remote hemorrhagic infarct of the posterior left insular cortex and superior left temporal lobe. MRA of the neck showed moderate irregularity proximal left common carotid artery estimated 50-75%. Severe narrowing left subclavian distal to the vertebral origin estimated 75-90%. He did not receive TPA. Echocardiogram with ejection fraction of 65% without emboli or wall motion abnormality. Patient was loaded with intravenous Dilantin. Noted Dilantin level on admission of 3.4. Family noted patient was quite religious in taking his seizure medication but had not seen his physician back in approximately one year for followup. He presently remains on Keppra 1750 mg every 12 hours and Dilantin 400 mg at bedtime. His latest Dilantin level was 15.3 on March 14. EEG showed presence of intermittent left frontal and left paracentral sharp waves suggestive of an underlying focus of cortical irritability no seizure activity noted. Patient was seen by neurology service and placed on Aggrenox for stroke prophylaxis. Currently maintained on a dysphagia one thin liquid diet per latest modified barium swallow March 18 and advanced to mechanical soft today. On March 15 patient developed abrupt onset of tachycardia with telemetry  revealing atrial flutter with RVR. Noted troponin level of 0.05 no indication of myocardial injury. Followup cardiology services Dr. Johney Frame and placed on cardizem for rate control and recommendations for Coumadin therapy March 17.  Patient transferred to CIR on 06/07/2011 .   Pt presents with decreased activity tolerance, decreased functional mobility, decreased balance, right sided weakness, decreased awareness, decreased cognition/safety, impulsivity limiting pt's independence with leisure/community pursuits.  Leisure History/Participation Premorbid leisure interest/no interest in trying: Community - Travel (Comment);Nature - Lawn care;Sports - Other (Comment);[place] - Fishing;Community - Grocery store;Community - Engineer, civil (consulting) (football) Expression Interests: Music (Comment) Other Leisure Interests: Television;Reading (newpaper, current events) Leisure Participation Style: With Family/Friends Awareness of Community Resources: Good-identify 3 post discharge leisure resources Psychosocial / Spiritual Stress Management: Fair Does patient have pets?: No Social interaction - Mood/Behavior: Cooperative Film/video editor for Education?: Yes Recreational Therapy Orientation Orientation -Reviewed with patient: Available activity resources Strengths/Weaknesses Patient Strengths/Abilities: Willingness to participate;Active premorbidly Patient weaknesses: Physical limitations  Plan Min 1 time per week >20 minutes  Recommendations for other services: None  Discharge Criteria: Patient will be discharged from TR if patient refuses treatment 3 consecutive times without medical reason.  If treatment goals not met, if there is a change in medical status, if patient makes no progress towards goals or if patient is discharged from hospital.  The above assessment, treatment plan, treatment alternatives and goals were discussed and mutually agreed upon: by  patient  Mariesa Grieder 06/09/2011, 5:21 PM

## 2011-06-09 NOTE — Patient Care Conference (Signed)
Inpatient RehabilitationTeam Conference Note Date: 06/09/2011   Time: 10:30 AM    Patient Name: [name] [facility]      Medical Record Number: [id number]  Date of Birth: 19-Feb-1941 Sex: Male         Room/Bed: 4028/4028-02 Payor Info: Payor: MEDICARE  Plan: MEDICARE PART A AND B  Product Type: *No Product type*     Admitting Diagnosis: LT Frontal CVA  Admit Date/Time:  06/07/2011  4:45 PM Admission Comments: No comment available   Primary Diagnosis:  CVA (cerebral infarction) Principal Problem: CVA (cerebral infarction)  Patient Active Problem List  Diagnoses Date Noted  . CVA (cerebral infarction) 06/08/2011  . Atrial flutter 06/04/2011  . Status epilepticus 06/02/2011  . 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 SINUSITIS, CHRONIC NEC 12/14/2006  . ANEMIA, DEFICIENCY NOS 10/11/2006  . ABUSE, ALCOHOL, CONTINUOUS 10/11/2006  . 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 ABNORMAL RESULT, FUNCTION STUDY, LIVER 10/11/2006  . CEREBROVASCULAR ACCIDENT, HX OF 10/11/2006  . DEMENTIA 09/15/2006  . HYPERTENSION 09/15/2006  . CVA 09/15/2006  . ALLERGIC RHINITIS 09/15/2006  . CIRRHOSIS, ALCOHOLIC, LIVER 09/15/2006  . SEIZURE DISORDER 09/15/2006  . PANCREATITIS, HX OF 09/15/2006    Expected Discharge Date:  2-3 weeks  Team Members Present: Physician: Dr. Claudette Laws Case Manager Present: Lutricia Horsfall, RN Social Worker Present: Dossie Der, LCSW Nurse Present: Gregor Hams, RN PT Present: Edman Circle, PT;Becky Henrene Dodge, PT OT Present: [name], OT SLP Present: Fae Pippin, SLP     Current Status/Progress Goal Weekly Team Focus  Medical   Hypokalemic improving after IV potassium and by mouth potassium. Diarrhea improving after discontinuing laxatives  Maintain normal potassium level  Adjust medications   Bowel/Bladder             Swallow/Nutrition/ Hydration   Eval pending         ADL's   mod assist bathing, supervision UB dsg, max assist LB dsg, mod  assist +2 sit to stand  supervision overall  decreased assist transfers, safety education   Mobility             Communication   Eval pending         Safety/Cognition/ Behavioral Observations  Eval pending         Pain             Skin                *See Interdisciplinary Assessment and Plan and progress notes for long and short-term goals  Barriers to Discharge: Severe right lower family weakness    Possible Resolutions to Barriers:  Continue therapy program    Discharge Planning/Teaching Needs:  Home with daughter to her home or his home here with daughter's assistance.  She is aware he will need care at discharge.      Team Discussion:   Pt unable to participate in therapies yesterday due to low K+ due to diarrhea. Pt okay for therapy today, evals in process. Continent. Oriented. ELOS 2-3 weeks. Will set d/c date next week.  Revisions to Treatment Plan:     Continued Need for Acute Rehabilitation Level of Care: The patient requires daily medical management by a physician with specialized training in physical medicine and rehabilitation for the following conditions: Daily direction of a multidisciplinary physical rehabilitation program to ensure safe treatment while eliciting the highest outcome that is of practical value to the patient.: Yes Daily medical management of patient stability for increased activity during participation in an intensive  rehabilitation regime.: Yes Daily analysis of laboratory values and/or radiology reports with any subsequent need for medication adjustment of medical intervention for : Neurological problems;Other  [name] 06/09/2011, 3:04 PM

## 2011-06-09 NOTE — Progress Notes (Signed)
Occupational Therapy Session Note  Patient Details  Name: [name] MRN: [id number] Date of Birth: 01-18-1941  Today's Date: 06/09/2011 Time: 1300-1330 Time Calculation (min): 30 min  Short Term Goals: Week 1:  OT Short Term Goal 1 (Week 1): Pt will complete bathing with min assist at sit to stand level in walk-in shower OT Short Term Goal 2 (Week 1): Pt will complete LB dressing with min assist at sit to stand level OT Short Term Goal 3 (Week 1): Pt will complete toilet transfer with min assist stand pivot OT Short Term Goal 4 (Week 1): Pt will complete grooming in standing with min assist  Skilled Therapeutic Interventions/Progress Updates:  Practiced sit to stand and taking steps in preparation for stepping into walk-in shower with 2" ledge.  Pt required assistance advancing  LLE with max verbal cues to slow down and proper sequencing.  Educated pt on sequencing for safe and correct sit to stands and transfers.  Practiced squat pivot transfer w/c <> sofa.  Pt exhibits impulsive behavior and requires max verbal cues for correct positioning and safety.  Therapy Documentation Precautions:  Precautions Precautions: Fall Precaution Comments: significant RLE weakness Required Braces or Orthoses: No Restrictions Weight Bearing Restrictions: No   Pain: Pain Assessment Pain Assessment: No/denies pain Pain Score: 0-No pain  See FIM for current functional status  Therapy/Group: Individual Therapy  Rich Brave 06/09/2011, 1:50 PM

## 2011-06-09 NOTE — Evaluation (Signed)
Physical Therapy Assessment and Plan  Patient Details  Name: [name] MRN: [id number] Date of Birth: 1940-05-22  PT Diagnosis: Abnormality of gait, Difficulty walking and Hemiparesis RLE ELOS: ~ 2 weeks   Today's Date: 06/09/2011 Time: 4098-1191 Time Calculation (min): 55 min  Problem List:  Patient Active Problem List  Diagnoses  . ANEMIA, DEFICIENCY NOS  . DEMENTIA  . ABUSE, ALCOHOL, CONTINUOUS  . ORGANIC BRAIN SYNDROME  . HYPERTENSION  . CVA  . SINUSITIS, CHRONIC NEC  . ALLERGIC RHINITIS  . CIRRHOSIS, ALCOHOLIC, LIVER  . ERECTILE DYSFUNCTION, ORGANIC  . SEIZURE DISORDER  . ABNORMAL RESULT, FUNCTION STUDY, LIVER  . CEREBROVASCULAR ACCIDENT, HX OF  . PANCREATITIS, HX OF  . Status epilepticus  . Atrial flutter  . CVA (cerebral infarction)    Past Medical History:  Past Medical History  Diagnosis Date  . Seizures   . Stroke    Past Surgical History: No past surgical history on file.  Assessment & Plan Clinical Impression: Patient is a 71 y.o. year old male with recent admission to the hospital with history of seizures as well as stroke that was found down by family with seizure activity and right-sided weakness. He was admitted March 13 due to seizure. It was unclear how long the patient had been seizing. Patient has been maintained on Dilantin and Keppra. His last noted seizure had been 2009. MRI of the brain showed acute nonhemorrhagic infarction in the medial left frontal lobe as well as stable remote hemorrhagic infarct of the posterior left insular cortex and superior left temporal lobe. MRA of the neck showed moderate irregularity proximal left common carotid artery estimated 50-75%. Severe narrowing left subclavian distal to the vertebral origin estimated 75-90%. He did not receive TPA. Echocardiogram with ejection fraction of 65% without emboli or wall motion abnormality. Patient was loaded with intravenous Dilantin. Noted Dilantin level on admission of  3.4. Family noted patient was quite religious in taking his seizure medication but had not seen his physician back in approximately one year for followup. He presently remains on Keppra 1750 mg every 12 hours and Dilantin 400 mg at bedtime. His latest Dilantin level was 15.3 on March 14. EEG showed presence of intermittent left frontal and left paracentral sharp waves suggestive of an underlying focus of cortical irritability no seizure activity noted. Patient was seen by neurology service and placed on Aggrenox for stroke prophylaxis. Currently maintained on a dysphagia one thin liquid diet per latest modified barium swallow March 18 and advanced to mechanical soft today. On March 15 patient developed abrupt onset of tachycardia with telemetry revealing atrial flutter with RVR. Noted troponin level of 0.05 no indication of myocardial injury. Followup cardiology services Dr. Johney Frame and placed on cardizem for rate control and recommendations for Coumadin therapy March 17.  Patient transferred to CIR on 06/07/2011 .   Patient currently requires mod to max A with mobility for transfers and gait secondary to muscle weakness, impaired timing and sequencing, decreased awareness and decreased safety awareness and decreased standing balance, decreased postural control, hemiplegia and decreased balance strategies.  Prior to hospitalization, patient was independent with mobility and lived with Alone in a House home.  Home access is  Level entry;Stairs to enter (1 step into the door/threshold).  Patient will benefit from skilled PT intervention to maximize safe functional mobility, minimize fall risk and decrease caregiver burden for planned discharge home with intermittent assist.  Anticipate patient will benefit from follow up [facility] at discharge.  PT  Assessment Rehab Potential: Good Barriers to Discharge: Decreased caregiver support (lives along but recommending supervision) PT Plan PT Frequency: 1-2 X/day, 60-90  minutes Estimated Length of Stay: ~ 2 weeks PT Treatment/Interventions: Ambulation/gait training;Balance/vestibular training;Cognitive remediation/compensation;Community reintegration;Discharge planning;DME/adaptive equipment instruction;Functional electrical stimulation;Functional mobility training;Neuromuscular re-education;Pain management;Patient/family education;Skin care/wound management;Psychosocial support;Splinting/orthotics;Stair training;Therapeutic Activities;Therapeutic Exercise;UE/LE Strength taining/ROM;UE/LE Coordination activities;Wheelchair propulsion/positioning PT Recommendation Follow Up Recommendations: Home health PT;24 hour supervision/assistance Equipment Recommended: Rolling walker with 5" wheels;Wheelchair (measurements)  PT Evaluation Precautions/Restrictions Precautions Precautions: Fall Precaution Comments: significant RLE weakness Required Braces or Orthoses: No Restrictions Weight Bearing Restrictions: No   Pain Pain Assessment Pain Assessment: No/denies pain Pain Score: 0-No pain Home Living/Prior Functioning Home Living Lives With: Alone Receives Help From: Family Type of Home: House Home Layout: One level Home Access: Level entry;Stairs to enter (1 step into the door/threshold) Entrance Stairs-Rails: None Bathroom Shower/Tub: Health visitor: Standard Home Adaptive Equipment: None Prior Function Level of Independence: Independent with basic ADLs;Independent with homemaking with ambulation;Independent with gait Driving: Yes Vocation: Retired Optometrist - History Baseline Vision: Wears glasses only for reading Patient Visual Report: No change from baseline Vision - Assessment Eye Alignment: Within Functional Limits Vision Assessment: Vision tested Perception Perception: Within Functional Limits Praxis Praxis: Intact  Cognition Arousal/Alertness: Awake/alert Orientation Level: Oriented X4 Attention:  Sustained Problem Solving Impairment: Verbal basic;Functional basic Sequencing: Impaired Self Correcting: Impaired Safety/Judgment: Impaired Sensation Sensation Light Touch: Appears Intact (reports no sensory defecits in RLE) Stereognosis: Not tested Hot/Cold: Not tested Proprioception: Impaired by gross assessment (RLE, decreased awareness noted with mobility) Coordination Gross Motor Movements are Fluid and Coordinated: No Fine Motor Movements are Fluid and Coordinated: No Coordination and Movement Description: due to weakness in RLE and proprioception Motor  Motor Motor: Hemiplegia;Abnormal postural alignment and control Motor - Skilled Clinical Observations: RLE weakness  Mobility Bed Mobility Supine to Sit: 4: Min assist Transfers Transfers: Yes Sit to Stand: 3: Mod assist;1: +2 Total assist;From bed;With upper extremity assist Sit to Stand Details: Verbal cues for sequencing;Verbal cues for precautions/safety;Manual facilitation for weight shifting Stand to Sit: With upper extremity assist;3: Mod assist Locomotion  Gait Gait: Yes Gait Pattern: Impaired Gait Pattern: Decreased step length - right;Decreased hip/knee flexion - right;Decreased dorsiflexion - right;Decreased weight shift to right;Decreased weight shift to left;Trunk flexed  Trunk/Postural Assessment  Cervical Assessment Cervical Assessment: Within Functional Limits Thoracic Assessment Thoracic Assessment: Within Functional Limits Lumbar Assessment Lumbar Assessment: Within Functional Limits Postural Control Postural Control: Deficits on evaluation (flexed posture in stand/gait, decreased balance reactions)  Balance Balance Balance Assessed: Yes Static Sitting Balance Static Sitting - Level of Assistance: 5: Stand by assistance Dynamic Sitting Balance Dynamic Sitting - Level of Assistance: 4: Min assist Static Standing Balance Static Standing - Level of Assistance: 4: Min assist (with 1 UE  support) Dynamic Standing Balance Dynamic Standing - Level of Assistance: 3: Mod assist Extremity Assessment  RUE Assessment RUE Assessment: Within Functional Limits (strength 4/5) LUE Assessment LUE Assessment: Within Functional Limits RLE Assessment RLE Assessment: Exceptions to [facility] RLE Strength RLE Overall Strength: Deficits RLE Overall Strength Comments: increased swelling in R foot, no active movement noted in ankle, hip flexion 3-/5 and knee extension 3-/5,, 2-/5 hip abduction LLE Assessment LLE Assessment: Exceptions to [facility] LLE Strength LLE Overall Strength Comments: strength grossly 4-/5  See FIM for current functional status Refer to Care Plan for Long Term Goals  Recommendations for other services: None  Discharge Criteria: Patient will be discharged from PT if patient refuses treatment 3  consecutive times without medical reason, if treatment goals not met, if there is a change in medical status, if patient makes no progress towards goals or if patient is discharged from hospital.  The above assessment, treatment plan, treatment alternatives and goals were discussed and mutually agreed upon: by patient  Individual therapy initiated with focus on transfer technique to/from w/c to/from as well as to/from toilet. Practiced squat pivot and stand pivot technique, tending to push with lower extremities from w/c requiring stabilization of w/c for safety. Completed gait with 2 person assist, three muskateers style, x 15' with cueing for weightshifts, manual facilitation to advance RLE (no dorsiflexion so movement mostly through hip and knee flexion). With RW, pt max A with second person for chair follow/safety, assistance needed for advancement of walker, again cueing and manual facilitation for RLE swing phase. Up/down 5 steps with 2 rails, second person for safety with max A of 1 (assistance to advance RLE onto step and maintain balance). Pt very quick to move, impulsive requiring mod  cueing to slow down and pay attention to RLE with transfers, gait, and stairs.   Karolee Stamps [name] 06/09/2011, 3:39 PM

## 2011-06-09 NOTE — Progress Notes (Signed)
Patient ID: [name], male   DOB: 06-Sep-1940, 71 y.o.   MRN: [id number]  Subjective/Complaints: Diarrhea improving after D/C of Senna  Objective: Vital Signs: Blood pressure 121/72, pulse 91, temperature 98.7 F (37.1 C), temperature source Oral, resp. rate 18, height 6\' 1"  (1.854 m), weight 79.3 kg (174 lb 13.2 oz), SpO2 94.00%. Dg Swallowing Func-no Report  06/07/2011  CLINICAL DATA: cva   FLUOROSCOPY FOR SWALLOWING FUNCTION STUDY:  Fluoroscopy was provided for swallowing function study, which was  administered by a speech pathologist.  Final results and recommendations  from this study are contained within the speech pathology report.     Results for orders placed during the hospital encounter of 06/07/11 (from the past 72 hour(s))  CBC     Status: Abnormal   Collection Time   06/08/11  6:50 AM      Component Value Range Comment   WBC 6.2  4.0 - 10.5 (K/uL)    RBC 3.29 (*) 4.22 - 5.81 (MIL/uL)    Hemoglobin 9.7 (*) 13.0 - 17.0 (g/dL)    HCT 40.9 (*) 81.1 - 52.0 (%)    MCV 86.9  78.0 - 100.0 (fL)    MCH 29.5  26.0 - 34.0 (pg)    MCHC 33.9  30.0 - 36.0 (g/dL)    RDW 91.4  78.2 - 95.6 (%)    Platelets 144 (*) 150 - 400 (K/uL)   COMPREHENSIVE METABOLIC PANEL     Status: Abnormal   Collection Time   06/08/11  6:50 AM      Component Value Range Comment   Sodium 144  135 - 145 (mEq/L)    Potassium 2.2 (*) 3.5 - 5.1 (mEq/L)    Chloride 108  96 - 112 (mEq/L)    CO2 24  19 - 32 (mEq/L)    Glucose, Bld 120 (*) 70 - 99 (mg/dL)    Dominguez 10  6 - 23 (mg/dL)    Creatinine, Ser 2.13  0.50 - 1.35 (mg/dL)    Calcium 8.4  8.4 - 10.5 (mg/dL)    Total Protein 5.5 (*) 6.0 - 8.3 (g/dL)    Albumin 2.7 (*) 3.5 - 5.2 (g/dL)    AST 47 (*) 0 - 37 (U/L)    ALT 46  0 - 53 (U/L)    Alkaline Phosphatase 69  39 - 117 (U/L)    Total Bilirubin 0.4  0.3 - 1.2 (mg/dL)    GFR calc non Af Amer >90  >90 (mL/min)    GFR calc Af Amer >90  >90 (mL/min)   DIFFERENTIAL     Status: Abnormal   Collection Time    06/08/11  6:50 AM      Component Value Range Comment   Neutrophils Relative 66  43 - 77 (%)    Neutro Abs 4.1  1.7 - 7.7 (K/uL)    Lymphocytes Relative 17  12 - 46 (%)    Lymphs Abs 1.1  0.7 - 4.0 (K/uL)    Monocytes Relative 13 (*) 3 - 12 (%)    Monocytes Absolute 0.8  0.1 - 1.0 (K/uL)    Eosinophils Relative 3  0 - 5 (%)    Eosinophils Absolute 0.2  0.0 - 0.7 (K/uL)    Basophils Relative 1  0 - 1 (%)    Basophils Absolute 0.0  0.0 - 0.1 (K/uL)   PROTIME-INR     Status: Abnormal   Collection Time   06/08/11  6:50 AM  Component Value Range Comment   Prothrombin Time 16.1 (*) 11.6 - 15.2 (seconds)    INR 1.26  0.00 - 1.49    POTASSIUM     Status: Abnormal   Collection Time   06/08/11  8:51 AM      Component Value Range Comment   Potassium 2.4 (*) 3.5 - 5.1 (mEq/L)   BASIC METABOLIC PANEL     Status: Abnormal   Collection Time   06/08/11 12:05 PM      Component Value Range Comment   Sodium 141  135 - 145 (mEq/L)    Potassium 2.9 (*) 3.5 - 5.1 (mEq/L)    Chloride 105  96 - 112 (mEq/L)    CO2 26  19 - 32 (mEq/L)    Glucose, Bld 102 (*) 70 - 99 (mg/dL)    Dominguez 11  6 - 23 (mg/dL)    Creatinine, Ser 1.61  0.50 - 1.35 (mg/dL)    Calcium 8.8  8.4 - 10.5 (mg/dL)    GFR calc non Af Amer >90  >90 (mL/min)    GFR calc Af Amer >90  >90 (mL/min)   PROTIME-INR     Status: Abnormal   Collection Time   06/09/11  6:17 AM      Component Value Range Comment   Prothrombin Time 18.3 (*) 11.6 - 15.2 (seconds)    INR 1.49  0.00 - 1.49    BASIC METABOLIC PANEL     Status: Abnormal   Collection Time   06/09/11  6:17 AM      Component Value Range Comment   Sodium 143  135 - 145 (mEq/L)    Potassium 2.8 (*) 3.5 - 5.1 (mEq/L)    Chloride 106  96 - 112 (mEq/L)    CO2 28  19 - 32 (mEq/L)    Glucose, Bld 111 (*) 70 - 99 (mg/dL)    Dominguez 10  6 - 23 (mg/dL)    Creatinine, Ser 0.96  0.50 - 1.35 (mg/dL)    Calcium 8.6  8.4 - 10.5 (mg/dL)    GFR calc non Af Amer >90  >90 (mL/min)    GFR calc Af Amer  >90  >90 (mL/min)     Physical Exam  Nursing note and vitals reviewed.  Constitutional: He appears well-developed.  HENT: PERRL, EOMI  Head: Normocephalic.  Neck: Normal range of motion. No thyromegaly present.  Cardiovascular: Normal rate.  Pulmonary/Chest: Breath sounds normal. He has no wheezes.  Abdominal: He exhibits no distension. There is no tenderness.  Musculoskeletal: He exhibits no edema.  RLE with 1+ edema  Neurological: He is alert.  Pt alert. Follows simple commnds, needing cueing at times. Right facial droop and tongue deviation. Speech dysarthric but Intelligible. He has mild verbal and motor apraxia.. Patient is oriented x3 with good recall. Diminished pain sense on the right side.Light touch intact RUE 4/5. RLE 0-1/5. Mild right pronator drift and decreased FMC on right. DTR's 3+ on right. Toes up.  Skin: Skin is warm and dry     Assessment/Plan: 1. Functional deficits secondary to L ACA infarct with RLE paresis which require 3+ hours per day of interdisciplinary therapy in a comprehensive inpatient rehab setting. Physiatrist is providing close team supervision and 24 hour management of active medical problems listed below. Physiatrist and rehab team continue to assess barriers to discharge/monitor patient progress toward functional and medical goals. FIM:       FIM - Toileting Toileting steps completed by patient: Adjust clothing prior  to toileting Toileting Assistive Devices: Grab bar or rail for support Toileting: 1: Two helpers  FIM - Archivist Transfers: 1-Two helpers  FIM - Press photographer: 1: Two helpers     Comprehension Comprehension Mode: Auditory Comprehension: 4-Understands basic 75 - 89% of the time/requires cueing 10 - 24% of the time  Expression Expression Mode: Verbal Expression: 4-Expresses basic 75 - 89% of the time/requires cueing 10 - 24% of the time. Needs helper to occlude trach/needs to repeat  words.  Social Interaction Social Interaction: 5-Interacts appropriately 90% of the time - Needs monitoring or encouragement for participation or interaction.  Problem Solving Problem Solving: 3-Solves basic 50 - 74% of the time/requires cueing 25 - 49% of the time  Memory Memory: 3-Recognizes or recalls 50 - 74% of the time/requires cueing 25 - 49% of the time  Medical Problem List and Plan:  1. 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 DVT Prophylaxis/Anticoagulation: Chronic Coumadin therapy. Monitor for any signs of bleeding  3. Recurrent seizure. Continue Dilantin and Keppra therapy as per neurology services. Will need followup Dilantin level in next couple days. Monitor for any further seizure activity.  4. Dysphagia. Followup per speech therapy.Pt advanced to D3 diet. Monitor for any signs of aspiration  5. Atrial flutter with RVR. Continue Cardizem and follow up cardiology services. HR controlled at present.  6.  Loose stools improving off senna 7.  Hypokalemia due to #6 increase po supplement may resume PT/OT  LOS (Days) 2 A FACE TO FACE EVALUATION WAS PERFORMED  Madilyn Cephas E 06/09/2011, 7:45 AM    Review of Systems  Cardiovascular: Negative for chest pain.  Gastrointestinal: Positive for diarrhea. Negative for nausea, vomiting and abdominal pain.  Neurological: Positive for focal weakness.  All other systems reviewed and are negative.

## 2011-06-10 DIAGNOSIS — Z5189 Encounter for other specified aftercare: Secondary | ICD-10-CM

## 2011-06-10 DIAGNOSIS — I6330 Cerebral infarction due to thrombosis of unspecified cerebral artery: Secondary | ICD-10-CM

## 2011-06-10 LAB — BASIC METABOLIC PANEL
BUN: 9 mg/dL (ref 6–23)
CO2: 27 mEq/L (ref 19–32)
Calcium: 8.6 mg/dL (ref 8.4–10.5)
Chloride: 107 mEq/L (ref 96–112)
Creatinine, Ser: 0.68 mg/dL (ref 0.50–1.35)
Glucose, Bld: 104 mg/dL — ABNORMAL HIGH (ref 70–99)

## 2011-06-10 LAB — PROTIME-INR: INR: 2.05 — ABNORMAL HIGH (ref 0.00–1.49)

## 2011-06-10 MED ORDER — WARFARIN SODIUM 3 MG PO TABS
3.0000 mg | ORAL_TABLET | Freq: Once | ORAL | Status: AC
  Administered 2011-06-10: 3 mg via ORAL
  Filled 2011-06-10: qty 1

## 2011-06-10 NOTE — Progress Notes (Signed)
ANTICOAGULATION CONSULT NOTE - FOLLOW UP  Pharmacy Consult: Coumadin Indication: atrial fibrillation  No Known Allergies  Patient Measurements: Height: 6\' 1"  (185.4 cm) Weight: 189 lb 12.8 oz (86.093 kg) IBW/kg (Calculated) : 79.9   Vital Signs: Temp: 98.8 F (37.1 C) (03/21 0604) Temp src: Oral (03/21 0604) BP: 109/58 mmHg (03/21 0604) Pulse Rate: 91  (03/21 0604)  Labs:  Alvira Philips 06/10/11 1610 06/09/11 0617 06/08/11 1205 06/08/11 0650  HGB -- -- -- 9.7*  HCT -- -- -- 28.6*  PLT -- -- -- 144*  APTT -- -- -- --  LABPROT 23.5* 18.3* -- 16.1*  INR 2.05* 1.49 -- 1.26  HEPARINUNFRC -- -- -- --  CREATININE 0.68 0.69 0.63 --  CKTOTAL -- -- -- --  CKMB -- -- -- --  TROPONINI -- -- -- --   Estimated Creatinine Clearance: 94.3 ml/min (by C-G formula based on Cr of 0.68).  Medical History: Past Medical History  Diagnosis Date  . Seizures   . Stroke     Medications:  Scheduled:     . diltiazem  120 mg Oral Daily  . levETIRAcetam  1,750 mg Oral BID  . phenytoin  134 mg Oral Q8H  . potassium chloride  20 mEq Oral BID  . potassium chloride  40 mEq Oral Once  . warfarin  7.5 mg Oral ONCE-1800  . Warfarin - Pharmacist Dosing Inpatient   Does not apply q1800    Assessment: 71 yo M s/p stroke with atrial fibrillation requiring initiation of Coumadin for further stroke prevention. Noted drug interaction with Dilantin -- Coumadin metabolism will inhibited initially then induced so expect INR to rapidly increase then become stable.   INR trending up but remains subtherapeutic.    Goal of Therapy:  INR 2-3    Plan:  - Coumadin 3 mg PO today - Daily PT/INR - Consider DPH level in one week (06/15/11)  Jill Side L. Illene Bolus, PharmD, BCPS Clinical Pharmacist Pager: [phone] 06/10/2011 1:25 PM

## 2011-06-10 NOTE — Progress Notes (Signed)
Physical Therapy Note  Patient Details  Name: [name] MRN: [id number] Date of Birth: 12/26/1940 Today's Date: 06/10/2011  1045-1110 (25 minutes) individual  Pain: no complaint of pain Focus of treatment: Neuro re-ed Rt LE Treatment: Sidelying rt hip flexion AA (gravity eliminated) 2 X 10; AA Rt knee flexion/extension in sidelying 2 X 10; manual leg press on right X 15; RT AA hip ab/adductiion X 10; hooklying bilateral hip ER/IR X 15: bridging X 10: SAQs Rt AA 2 X 10. Mixtli Reno,JIM 06/10/2011, 10:56 AM

## 2011-06-10 NOTE — Progress Notes (Signed)
Occupational Therapy Session Note  Patient Details  Name: [name] MRN: [id number] Date of Birth: 1940/10/12  Today's Date: 06/10/2011 Time: 0930-1025 Time Calculation (min): 55 min  Short Term Goals: Week 1:  OT Short Term Goal 1 (Week 1): Pt will complete bathing with min assist at sit to stand level in walk-in shower OT Short Term Goal 2 (Week 1): Pt will complete LB dressing with min assist at sit to stand level OT Short Term Goal 3 (Week 1): Pt will complete toilet transfer with min assist stand pivot OT Short Term Goal 4 (Week 1): Pt will complete grooming in standing with min assist  Skilled Therapeutic Interventions/Progress Updates:    Pt seen for ADL retraining with focus on transfers, sit <> stand, standing balance, RLE engagement, and higher level cognitive tasks.  Pt with increased difficulty this session with initiation, sequencing, and problem solving with bathing at sink.  Pt with constant questions of "what's next" followed by "I'm ready to finish this task and move on".  Pt mod assist toilet transfer with loose stool.  Required increased cues for standing balance and tactile cues to stand upright, demonstrates posterior lean.  Pt unable to problem solve locking w/c brakes and constant cues to move leg rests prior to standing each time.  Pt with slight agitation upon reminders for safety.  Therapy Documentation Precautions:  Precautions Precautions: Fall Precaution Comments: Dys. 3 textures and thin liquids; significant RLE weakness Required Braces or Orthoses: No Restrictions Weight Bearing Restrictions: No Pain: Pain Assessment Pain Assessment: No/denies pain Pain Score: 0-No pain  See FIM for current functional status  Therapy/Group: Individual Therapy  [name] 06/10/2011, 11:49 AM

## 2011-06-10 NOTE — Progress Notes (Signed)
Physical Therapy Note  Patient Details  Name: LEXANDER TREMBLAY MRN: [id number] Date of Birth: 05-Jul-1940 Today's Date: 06/10/2011  1430-1525  (55 minutes) individual Pain- no complaint of pain Focus of treatment: Gait training with assistive device  Treatment: Sit to stand from wc min/mod assist (variable); gait 44-45 feet X 4 with RW min/mod assist ace wrap Rt ankle with vcs for walker placement and sequencing RT LE. Pt required max cues for stand to sit to mat (attempted to sit before reaching seating surface); sit to stand X 10 from raised mat initially mod assist progressing to min assist with cues for adequate forward trunk flexion; standing RT hip flexion to 4 inch step with RW support to facilitate increased RT hip flexion min assist (Rt LE quickly fatigues) Ellwyn Ergle,JIM 06/10/2011, 2:52 PM

## 2011-06-10 NOTE — Plan of Care (Signed)
Problem: RH SAFETY Goal: RH STG DECREASED RISK OF FALL WITH ASSISTANCE STG Decreased Risk of Fall With min. Assistance.  Outcome: Not Progressing Moderate assistance required at this time.

## 2011-06-10 NOTE — Progress Notes (Signed)
Speech Language Pathology Daily Session Note  Patient Details  Name: [name] MRN: [id number] Date of Birth: [birth year]  Today's Date: 06/10/2011 Time: 2956-2130 Time Calculation (min): 40 min  Short Term Goals: Week 1: SLP Short Term Goal 1 (Week 1): Patient will consume Dys.3 textures and thin liquids with supervision assist for carryover of compensatory strategies SLP Short Term Goal 2 (Week 1): Patient will request help as needed with BADLs with moderate assist semantic cues SLP Short Term Goal 3 (Week 1): Patient will utilize description during moments of word finding with basic information with mod I  SLP Short Term Goal 4 (Week 1): Patient will follow 2 step directions with moderate assist semantic cues  Skilled Therapeutic Interventions: Session focused on verbal expression of wants and need; patient found with meal tray and was attempting to open containers; however, he was unable to problem solve task and required max assist to recognize this and allow SLP to assist him; SLP introduced a naming task and patient was able to name objects in room with 75% accuracy and his errors were characterized by perseveration, phonemic and sematic paraphasias.  Patient demonstrated some emerging awareness of errors with max assist question and visual cues; SLP facilitated 4 step sequencing task with minimal assist visual and semantic cues to organize and self monitor.    Daily Session Precautions/Restrictions  Restrictions Weight Bearing Restrictions: No FIM:  Comprehension Comprehension Mode: Auditory Comprehension: 3-Understands basic 50 - 74% of the time/requires cueing 25 - 50%  of the time Expression Expression Mode: Verbal Expression: 4-Expresses basic 75 - 89% of the time/requires cueing 10 - 24% of the time. Needs helper to occlude trach/needs to repeat words. Social Interaction Social Interaction: 3-Interacts appropriately 50 - 74% of the time - May be physically or  verbally inappropriate. Problem Solving Problem Solving: 2-Solves basic 25 - 49% of the time - needs direction more than half the time to initiate, plan or complete simple activities Memory Memory: 2-Recognizes or recalls 25 - 49% of the time/requires cueing 51 - 75% of the time FIM - Eating Eating Activity: 5: Supervision/cues General    Pain Pain Assessment Pain Assessment: No/denies pain Pain Score: 0-No pain  Therapy/Group: Individual Therapy  Fae Pippin, M.A., CCC-SLP [phone]

## 2011-06-10 NOTE — Progress Notes (Signed)
Patient ID: [name], male   DOB: [birth year], 71 y.o.   MRN: [id number]  Subjective/Complaints: Diarrhea ongoing after D/C of Senna No N/V or abd pain  Objective: Vital Signs: Blood pressure 109/58, pulse 91, temperature 98.8 F (37.1 C), temperature source Oral, resp. rate 18, height 6\' 1"  (1.854 m), weight 86.093 kg (189 lb 12.8 oz), SpO2 94.00%. No results found. Results for orders placed during the hospital encounter of 06/07/11 (from the past 72 hour(s))  CBC     Status: Abnormal   Collection Time   06/08/11  6:50 AM      Component Value Range Comment   WBC 6.2  4.0 - 10.5 (K/uL)    RBC 3.29 (*) 4.22 - 5.81 (MIL/uL)    Hemoglobin 9.7 (*) 13.0 - 17.0 (g/dL)    HCT 40.9 (*) 81.1 - 52.0 (%)    MCV 86.9  78.0 - 100.0 (fL)    MCH 29.5  26.0 - 34.0 (pg)    MCHC 33.9  30.0 - 36.0 (g/dL)    RDW 91.4  78.2 - 95.6 (%)    Platelets 144 (*) 150 - 400 (K/uL)   COMPREHENSIVE METABOLIC PANEL     Status: Abnormal   Collection Time   06/08/11  6:50 AM      Component Value Range Comment   Sodium 144  135 - 145 (mEq/L)    Potassium 2.2 (*) 3.5 - 5.1 (mEq/L)    Chloride 108  96 - 112 (mEq/L)    CO2 24  19 - 32 (mEq/L)    Glucose, Bld 120 (*) 70 - 99 (mg/dL)    Dominguez 10  6 - 23 (mg/dL)    Creatinine, Ser 2.13  0.50 - 1.35 (mg/dL)    Calcium 8.4  8.4 - 10.5 (mg/dL)    Total Protein 5.5 (*) 6.0 - 8.3 (g/dL)    Albumin 2.7 (*) 3.5 - 5.2 (g/dL)    AST 47 (*) 0 - 37 (U/L)    ALT 46  0 - 53 (U/L)    Alkaline Phosphatase 69  39 - 117 (U/L)    Total Bilirubin 0.4  0.3 - 1.2 (mg/dL)    GFR calc non Af Amer >90  >90 (mL/min)    GFR calc Af Amer >90  >90 (mL/min)   DIFFERENTIAL     Status: Abnormal   Collection Time   06/08/11  6:50 AM      Component Value Range Comment   Neutrophils Relative 66  43 - 77 (%)    Neutro Abs 4.1  1.7 - 7.7 (K/uL)    Lymphocytes Relative 17  12 - 46 (%)    Lymphs Abs 1.1  0.7 - 4.0 (K/uL)    Monocytes Relative 13 (*) 3 - 12 (%)    Monocytes Absolute 0.8   0.1 - 1.0 (K/uL)    Eosinophils Relative 3  0 - 5 (%)    Eosinophils Absolute 0.2  0.0 - 0.7 (K/uL)    Basophils Relative 1  0 - 1 (%)    Basophils Absolute 0.0  0.0 - 0.1 (K/uL)   PROTIME-INR     Status: Abnormal   Collection Time   06/08/11  6:50 AM      Component Value Range Comment   Prothrombin Time 16.1 (*) 11.6 - 15.2 (seconds)    INR 1.26  0.00 - 1.49    POTASSIUM     Status: Abnormal   Collection Time   06/08/11  8:51 AM  Component Value Range Comment   Potassium 2.4 (*) 3.5 - 5.1 (mEq/L)   BASIC METABOLIC PANEL     Status: Abnormal   Collection Time   06/08/11 12:05 PM      Component Value Range Comment   Sodium 141  135 - 145 (mEq/L)    Potassium 2.9 (*) 3.5 - 5.1 (mEq/L)    Chloride 105  96 - 112 (mEq/L)    CO2 26  19 - 32 (mEq/L)    Glucose, Bld 102 (*) 70 - 99 (mg/dL)    Dominguez 11  6 - 23 (mg/dL)    Creatinine, Ser 1.61  0.50 - 1.35 (mg/dL)    Calcium 8.8  8.4 - 10.5 (mg/dL)    GFR calc non Af Amer >90  >90 (mL/min)    GFR calc Af Amer >90  >90 (mL/min)   PROTIME-INR     Status: Abnormal   Collection Time   06/09/11  6:17 AM      Component Value Range Comment   Prothrombin Time 18.3 (*) 11.6 - 15.2 (seconds)    INR 1.49  0.00 - 1.49    BASIC METABOLIC PANEL     Status: Abnormal   Collection Time   06/09/11  6:17 AM      Component Value Range Comment   Sodium 143  135 - 145 (mEq/L)    Potassium 2.8 (*) 3.5 - 5.1 (mEq/L)    Chloride 106  96 - 112 (mEq/L)    CO2 28  19 - 32 (mEq/L)    Glucose, Bld 111 (*) 70 - 99 (mg/dL)    Dominguez 10  6 - 23 (mg/dL)    Creatinine, Ser 0.96  0.50 - 1.35 (mg/dL)    Calcium 8.6  8.4 - 10.5 (mg/dL)    GFR calc non Af Amer >90  >90 (mL/min)    GFR calc Af Amer >90  >90 (mL/min)   PROTIME-INR     Status: Abnormal   Collection Time   06/10/11  6:39 AM      Component Value Range Comment   Prothrombin Time 23.5 (*) 11.6 - 15.2 (seconds)    INR 2.05 (*) 0.00 - 1.49      Physical Exam  Nursing note and vitals reviewed.    Constitutional: He appears well-developed.  HENT: PERRL, EOMI  Head: Normocephalic.  Neck: Normal range of motion. No thyromegaly present.  Cardiovascular: Normal rate.  Pulmonary/Chest: Breath sounds normal. He has no wheezes.  Abdominal: He exhibits no distension. There is no tenderness.  Musculoskeletal: He exhibits no edema.  RLE with 1+ edema  Neurological: He is alert.  Pt alert. Follows simple commnds, needing cueing at times. Right facial droop and tongue deviation. Speech dysarthric but Intelligible. He has mild verbal and motor apraxia.. Patient is oriented x3 with good recall. Diminished pain sense on the right side.Light touch intact RUE 4/5. RLE 0-1/5. Mild right pronator drift and decreased FMC on right. DTR's 3+ on right. Toes up.  Skin: Skin is warm and dry     Assessment/Plan: 1. Functional deficits secondary to L ACA infarct with RLE paresis which require 3+ hours per day of interdisciplinary therapy in a comprehensive inpatient rehab setting. Physiatrist is providing close team supervision and 24 hour management of active medical problems listed below. Physiatrist and rehab team continue to assess barriers to discharge/monitor patient progress toward functional and medical goals. FIM: FIM - Bathing Bathing Steps Patient Completed: Chest;Right Arm;Left Arm;Abdomen;Front perineal area;Right upper leg;Left upper leg  Bathing: 3: Mod-Patient completes 5-7 49f 10 parts or 50-74%  FIM - Upper Body Dressing/Undressing Upper body dressing/undressing steps patient completed: Thread/unthread right sleeve of pullover shirt/dresss;Thread/unthread left sleeve of pullover shirt/dress;Put head through opening of pull over shirt/dress;Pull shirt over trunk Upper body dressing/undressing: 5: Set-up assist to: Obtain clothing/put away FIM - Lower Body Dressing/Undressing Lower body dressing/undressing steps patient completed: Thread/unthread left underwear leg;Thread/unthread left pants  leg Lower body dressing/undressing: 2: Max-Patient completed 25-49% of tasks  FIM - Toileting Toileting steps completed by patient: Adjust clothing prior to toileting Toileting Assistive Devices: Grab bar or rail for support Toileting: 1: Two helpers  FIM - Archivist Transfers: 1-Two helpers  FIM - Banker Devices: Arm rests Bed/Chair Transfer: 4: Supine > Sit: Min A (steadying Pt. > 75%/lift 1 leg);4: Sit > Supine: Min A (steadying pt. > 75%/lift 1 leg);3: Bed > Chair or W/C: Mod A (lift or lower assist);3: Chair or W/C > Bed: Mod A (lift or lower assist)  FIM - Locomotion: Wheelchair Locomotion: Wheelchair: 2: Travels 50 - 149 ft with supervision, cueing or coaxing FIM - Locomotion: Ambulation Locomotion: Ambulation: 1: Two helpers (15' first time total+2, max A with RW x 25')  Comprehension Comprehension Mode: Auditory Comprehension: 4-Understands basic 75 - 89% of the time/requires cueing 10 - 24% of the time  Expression Expression Mode: Verbal Expression: 5-Expresses complex 90% of the time/cues < 10% of the time  Social Interaction Social Interaction: 4-Interacts appropriately 75 - 89% of the time - Needs redirection for appropriate language or to initiate interaction.  Problem Solving Problem Solving: 3-Solves basic 50 - 74% of the time/requires cueing 25 - 49% of the time  Memory Memory: 2-Recognizes or recalls 25 - 49% of the time/requires cueing 51 - 75% of the time  Medical Problem List and Plan:  1. 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 DVT Prophylaxis/Anticoagulation: Chronic Coumadin therapy. Monitor for any signs of bleeding  3. Recurrent seizure. Continue Dilantin and Keppra therapy as per neurology services. Will need followup Dilantin level in next couple days. Monitor for any further seizure activity.  4.  Dysphagia. Followup per speech therapy.Pt advanced to D3 diet. Monitor for any signs of aspiration  5. Atrial flutter with RVR. Continue Cardizem and follow up cardiology services. HR controlled at present.  6.  Loose stools improving off senna but ongoing issues  Check c diff 7.  Hypokalemia due to #6 increase po supplement may resume PT/OT  LOS (Days) 3 A FACE TO FACE EVALUATION WAS PERFORMED  [name] 06/10/2011, 7:15 AM    Review of Systems  Cardiovascular: Negative for chest pain.  Gastrointestinal: Positive for diarrhea. Negative for nausea, vomiting and abdominal pain.  Neurological: Positive for focal weakness.  All other systems reviewed and are negative.

## 2011-06-11 LAB — BASIC METABOLIC PANEL
CO2: 26 mEq/L (ref 19–32)
Calcium: 8.5 mg/dL (ref 8.4–10.5)
Chloride: 108 mEq/L (ref 96–112)
Glucose, Bld: 126 mg/dL — ABNORMAL HIGH (ref 70–99)
Potassium: 3 mEq/L — ABNORMAL LOW (ref 3.5–5.1)
Sodium: 144 mEq/L (ref 135–145)

## 2011-06-11 LAB — PROTIME-INR: INR: 2.34 — ABNORMAL HIGH (ref 0.00–1.49)

## 2011-06-11 MED ORDER — POTASSIUM CHLORIDE CRYS ER 20 MEQ PO TBCR: 30.0000 meq | EXTENDED_RELEASE_TABLET | Freq: Two times a day (BID) | ORAL | Status: DC

## 2011-06-11 MED ORDER — POTASSIUM CHLORIDE CRYS ER 20 MEQ PO TBCR
30.0000 meq | EXTENDED_RELEASE_TABLET | Freq: Two times a day (BID) | ORAL | Status: DC
  Administered 2011-06-11 – 2011-06-13 (×6): 30 meq via ORAL
  Filled 2011-06-11 (×9): qty 1

## 2011-06-11 MED ORDER — WARFARIN SODIUM 3 MG PO TABS
3.0000 mg | ORAL_TABLET | Freq: Once | ORAL | Status: AC
  Administered 2011-06-11: 3 mg via ORAL
  Filled 2011-06-11: qty 1

## 2011-06-11 NOTE — Progress Notes (Signed)
Physical Therapy Session Note  Patient Details  Name: [name] MRN: [id number] Date of Birth: 04-16-1940  Today's Date: 06/11/2011 Time: 8295-6213 Time Calculation (min): 57 min  Short Term Goals: Week 1:  PT Short Term Goal 1 (Week 1): Pt will be able to complete bed mobility with supervision (with adaptive equipment if needed) PT Short Term Goal 2 (Week 1): Pt will be able to maintain dynamic standing balance during functional task with min A PT Short Term Goal 3 (Week 1): Pt will be able to demonstrate functional transfers with min A PT Short Term Goal 4 (Week 1): Pt will be able to gait x 50' with min A  Therapy Documentation Precautions:  Precautions Precautions: Fall Precaution Comments: Dys. 3 textures and thin liquids; significant RLE weakness Required Braces or Orthoses: No Restrictions Weight Bearing Restrictions: No Pain: Pain Assessment Pain Assessment: No/denies pain Locomotion : Wheelchair Mobility Distance: 150 supervision controlled environment Other Treatments: Treatments Therapeutic Activity: Patient performed squat scoots bed <> w/c and w/c <> mat to L and R with min A with minimal cues needed for sequence and foot placement; observed patient perform sit <> supine on flat mat and patient performed initiating with his head and trunk extension.  Performed bed mobility training: rolling to L and R with focus on sequencing hip/knee flexion, lower trunk rotation, pelvic rotation and upper trunk rotation with UE protraction while reaching across midline beginning with total-moderate verbal, visual and tactile cues for sequence.  Side <> sit training with focus on trunk elongation <> shortening with pelvic depression <> elevation; patient progressed to performing with min A but could not carry over sequence completely when asked to return demonstrate in hospital bed.  Will continue to reinforce sequence with patient  See FIM for current functional  status  Therapy/Group: Individual Therapy  Edman Circle [facility] 06/11/2011, 5:33 PM

## 2011-06-11 NOTE — Progress Notes (Signed)
Physical Therapy Session Note  Patient Details  Name: [name] MRN: [id number] Date of Birth: 04/08/1940  Today's Date: 06/11/2011 Time: 4098-1191 Time Calculation (min): 30 min  Short Term Goals: Week 1:  PT Short Term Goal 1 (Week 1): Pt will be able to complete bed mobility with supervision (with adaptive equipment if needed) PT Short Term Goal 2 (Week 1): Pt will be able to maintain dynamic standing balance during functional task with min A PT Short Term Goal 3 (Week 1): Pt will be able to demonstrate functional transfers with min A PT Short Term Goal 4 (Week 1): Pt will be able to gait x 50' with min A  Skilled Therapeutic Interventions/Progress Updates: treatment focus: neuromuscular re-education via manual and verbal cues, demo, visual feedback.  W/C > mat squat pivot with min A with limited use of RLE, sitting on mat at first only with L hip.  Neuromuscular re-education R trunk and LE for head righting via trunk lengthening/shortening with wt shifting; pt demonstrates limited R trunk activation.  Transfer training focusing on RLE wt bearing, lifting hips using BLEs without use of BUEs, improving during session.  Mat> w/c stand/pivot transfer with better activation of RLE, min A.  W/C > recliner transfer in room to L squat pivot transfer with same problems of "sitting early" with L hip only on recliner.  Educated pt on ankle pumps and elevating bil LEs when resting in room, sitting in recliner.         Therapy Documentation Precautions:  Precautions Precautions: Fall Precaution Comments: Dys. 3 textures and thin liquids; significant RLE weakness Required Braces or Orthoses: No Restrictions Weight Bearing Restrictions: No General:   Vital Signs:   Pain: Pain Assessment Pain Assessment: No/denies pain Pain Score: 0-No pain Mobility:   Locomotion :    Trunk/Postural Assessment :    Balance:   Exercises:   Other Treatments:    See FIM for current  functional status  Therapy/Group: Individual Therapy  Avary Eichenberger 06/11/2011, 2:45 PM

## 2011-06-11 NOTE — Progress Notes (Signed)
Patient ID: [name], male   DOB: 01-07-1941, 71 y.o.   MRN: [id number]  Subjective/Complaints: Diarrhea ongoing after D/C of Senna No N/V or abd pain  Objective: Vital Signs: Blood pressure 106/66, pulse 65, temperature 98.3 F (36.8 C), temperature source Axillary, resp. rate 18, height 6\' 1"  (1.854 m), weight 86.093 kg (189 lb 12.8 oz), SpO2 94.00%. No results found. Results for orders placed during the hospital encounter of 06/07/11 (from the past 72 hour(s))  POTASSIUM     Status: Abnormal   Collection Time   06/08/11  8:51 AM      Component Value Range Comment   Potassium 2.4 (*) 3.5 - 5.1 (mEq/L)   BASIC METABOLIC PANEL     Status: Abnormal   Collection Time   06/08/11 12:05 PM      Component Value Range Comment   Sodium 141  135 - 145 (mEq/L)    Potassium 2.9 (*) 3.5 - 5.1 (mEq/L)    Chloride 105  96 - 112 (mEq/L)    CO2 26  19 - 32 (mEq/L)    Glucose, Bld 102 (*) 70 - 99 (mg/dL)    Dominguez 11  6 - 23 (mg/dL)    Creatinine, Ser 4.09  0.50 - 1.35 (mg/dL)    Calcium 8.8  8.4 - 10.5 (mg/dL)    GFR calc non Af Amer >90  >90 (mL/min)    GFR calc Af Amer >90  >90 (mL/min)   PROTIME-INR     Status: Abnormal   Collection Time   06/09/11  6:17 AM      Component Value Range Comment   Prothrombin Time 18.3 (*) 11.6 - 15.2 (seconds)    INR 1.49  0.00 - 1.49    BASIC METABOLIC PANEL     Status: Abnormal   Collection Time   06/09/11  6:17 AM      Component Value Range Comment   Sodium 143  135 - 145 (mEq/L)    Potassium 2.8 (*) 3.5 - 5.1 (mEq/L)    Chloride 106  96 - 112 (mEq/L)    CO2 28  19 - 32 (mEq/L)    Glucose, Bld 111 (*) 70 - 99 (mg/dL)    Dominguez 10  6 - 23 (mg/dL)    Creatinine, Ser 8.11  0.50 - 1.35 (mg/dL)    Calcium 8.6  8.4 - 10.5 (mg/dL)    GFR calc non Af Amer >90  >90 (mL/min)    GFR calc Af Amer >90  >90 (mL/min)   PROTIME-INR     Status: Abnormal   Collection Time   06/10/11  6:39 AM      Component Value Range Comment   Prothrombin Time 23.5 (*) 11.6 -  15.2 (seconds)    INR 2.05 (*) 0.00 - 1.49    BASIC METABOLIC PANEL     Status: Abnormal   Collection Time   06/10/11  6:39 AM      Component Value Range Comment   Sodium 143  135 - 145 (mEq/L)    Potassium 3.0 (*) 3.5 - 5.1 (mEq/L)    Chloride 107  96 - 112 (mEq/L)    CO2 27  19 - 32 (mEq/L)    Glucose, Bld 104 (*) 70 - 99 (mg/dL)    Dominguez 9  6 - 23 (mg/dL)    Creatinine, Ser 9.14  0.50 - 1.35 (mg/dL)    Calcium 8.6  8.4 - 10.5 (mg/dL)    GFR calc non Af Amer >  90  >90 (mL/min)    GFR calc Af Amer >90  >90 (mL/min)   PROTIME-INR     Status: Abnormal   Collection Time   06/11/11  6:30 AM      Component Value Range Comment   Prothrombin Time 26.0 (*) 11.6 - 15.2 (seconds)    INR 2.34 (*) 0.00 - 1.49      Physical Exam  Nursing note and vitals reviewed.  Constitutional: He appears well-developed.  HENT: PERRL, EOMI  Head: Normocephalic.  Neck: Normal range of motion. No thyromegaly present.  Cardiovascular: Normal rate.  Pulmonary/Chest: Breath sounds normal. He has no wheezes.  Abdominal: He exhibits no distension. There is no tenderness.  Musculoskeletal: He exhibits no edema.  RLE with 1+ edema  Neurological: He is alert.  Pt alert. Follows simple commnds, needing cueing at times. Right facial droop and tongue deviation. Speech dysarthric but Intelligible. He has mild verbal and motor apraxia.. Patient is oriented x3 with good recall. Diminished pain sense on the right side.Light touch intact RUE 4/5. RLE 0-1/5. Mild right pronator drift and decreased FMC on right. DTR's 3+ on right. Toes up.  Skin: Skin is warm and dry     Assessment/Plan: 1. Functional deficits secondary to L ACA infarct with RLE paresis which require 3+ hours per day of interdisciplinary therapy in a comprehensive inpatient rehab setting. Physiatrist is providing close team supervision and 24 hour management of active medical problems listed below. Physiatrist and rehab team continue to assess barriers to  discharge/monitor patient progress toward functional and medical goals. FIM: FIM - Bathing Bathing Steps Patient Completed: Right Arm;Chest;Left Arm;Abdomen Bathing: 2: Max-Patient completes 3-4 51f 10 parts or 25-49%  FIM - Upper Body Dressing/Undressing Upper body dressing/undressing steps patient completed: Thread/unthread right sleeve of pullover shirt/dresss;Thread/unthread left sleeve of pullover shirt/dress;Put head through opening of pull over shirt/dress;Pull shirt over trunk Upper body dressing/undressing: 5: Set-up assist to: Obtain clothing/put away FIM - Lower Body Dressing/Undressing Lower body dressing/undressing steps patient completed: Thread/unthread left underwear leg;Pull underwear up/down;Thread/unthread left pants leg Lower body dressing/undressing: 3: Mod-Patient completed 50-74% of tasks  FIM - Toileting Toileting steps completed by patient: Adjust clothing prior to toileting Toileting Assistive Devices: Grab bar or rail for support Toileting: 2: Max-Patient completed 1 of 3 steps  FIM - Diplomatic Services operational officer Devices: Bedside commode;Grab bars (over toilet) Toilet Transfers: 3-To toilet/BSC: Mod A (lift or lower assist);3-From toilet/BSC: Mod A (lift or lower assist)  FIM - Bed/Chair Transfer Bed/Chair Transfer Assistive Devices: HOB elevated;Bed rails Bed/Chair Transfer: 3: Bed > Chair or W/C: Mod A (lift or lower assist);3: Chair or W/C > Bed: Mod A (lift or lower assist)  FIM - Locomotion: Wheelchair Locomotion: Wheelchair: 2: Travels 50 - 149 ft with supervision, cueing or coaxing FIM - Locomotion: Ambulation Locomotion: Ambulation Assistive Devices: Walker - Rolling (ace wrap Rt ankle) Ambulation/Gait Assistance: 3: Mod assist Locomotion: Ambulation: 1: Travels less than 50 ft with moderate assistance (Pt: 50 - 74%)  Comprehension Comprehension Mode: Auditory Comprehension: 3-Understands basic 50 - 74% of the time/requires cueing 25  - 50%  of the time  Expression Expression Mode: Verbal Expression: 4-Expresses basic 75 - 89% of the time/requires cueing 10 - 24% of the time. Needs helper to occlude trach/needs to repeat words.  Social Interaction Social Interaction: 3-Interacts appropriately 50 - 74% of the time - May be physically or verbally inappropriate.  Problem Solving Problem Solving: 2-Solves basic 25 - 49% of the time -  needs direction more than half the time to initiate, plan or complete simple activities  Memory Memory: 2-Recognizes or recalls 25 - 49% of the time/requires cueing 51 - 75% of the time  Medical Problem List and Plan:  1. 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 DVT Prophylaxis/Anticoagulation: Chronic Coumadin therapy. Monitor for any signs of bleeding  3. Recurrent seizure. Continue Dilantin and Keppra therapy as per neurology services. Will need followup Dilantin level in next couple days. Monitor for any further seizure activity.  4. Dysphagia. Followup per speech therapy.Pt advanced to D3 diet. Monitor for any signs of aspiration  5. Atrial flutter with RVR. Continue Cardizem and follow up cardiology services. HR controlled at present.  6.  Loose stools improving off senna but ongoing issues  Check c diff, 1 loose stool on 3/21  7.  Hypokalemia due to #6 increase po supplement may resume PT/OT  LOS (Days) 4 A FACE TO FACE EVALUATION WAS PERFORMED  Zylon Creamer E 06/11/2011, 7:41 AM    Review of Systems  Cardiovascular: Negative for chest pain.  Gastrointestinal: Positive for diarrhea. Negative for nausea, vomiting and abdominal pain.  Neurological: Positive for focal weakness.  All other systems reviewed and are negative.

## 2011-06-11 NOTE — Progress Notes (Signed)
Occupational Therapy Session Note  Patient Details  Name: [name] MRN: [id number] Date of Birth: 12/15/1940  Today's Date: 06/11/2011 Time: 2725-3664 Time Calculation (min): 60 min  Short Term Goals: Week 1:  OT Short Term Goal 1 (Week 1): Pt will complete bathing with min assist at sit to stand level in walk-in shower OT Short Term Goal 2 (Week 1): Pt will complete LB dressing with min assist at sit to stand level OT Short Term Goal 3 (Week 1): Pt will complete toilet transfer with min assist stand pivot OT Short Term Goal 4 (Week 1): Pt will complete grooming in standing with min assist  Skilled Therapeutic Interventions/Progress Updates:    Pt seen for ADL retraining in room shower with sit to stand from tub bench.  Pt demonstrated improved sequencing and problem solving with bathing in a more typical shower setting than yesterday's session at sink level.  Pt continues to be wary of asking for help and today asked this therapist if she needed to be in the bathroom while he showered.  Educated pt on safety precautions and the assist that he requires with sit to stand and positioning/advancing RLE in standing and stepping.  Pt demonstrated difficulty with word finding this session and some perseveration with conversation.  Pt continues to have loose stools.   Therapy Documentation Precautions:  Precautions Precautions: Fall Precaution Comments: Dys. 3 textures and thin liquids; significant RLE weakness Required Braces or Orthoses: No Restrictions Weight Bearing Restrictions: No Pain: Pain Assessment Pain Assessment: No/denies pain Pain Score: 0-No pain  See FIM for current functional status  Therapy/Group: Individual Therapy  [name] 06/11/2011, 11:38 AM

## 2011-06-11 NOTE — Progress Notes (Signed)
Per State Regulation 482.30 This chart was reviewed for medical necessity with respect to the patient's Admission/Duration of stay. Pt participating in therapies with slow progress. Pt requiring verbal cues for problem solving, safety.  Loose stools -- checking for c-diff. [name]                 Nurse Care Manager            Next Review Date: 06/14/11

## 2011-06-11 NOTE — Progress Notes (Signed)
Speech Language Pathology Daily Session Note  Patient Details  Name: [name] MRN: [id number] Date of Birth: [birth year]  Today's Date: 06/11/2011 Time: 7829-5621 Time Calculation (min): 45 min  Short Term Goals: Week 1: SLP Short Term Goal 1 (Week 1): Patient will consume Dys.3 textures and thin liquids with supervision assist for carryover of compensatory strategies SLP Short Term Goal 2 (Week 1): Patient will request help as needed with BADLs with moderate assist semantic cues SLP Short Term Goal 3 (Week 1): Patient will utilize description during moments of word finding with basic information with mod I  SLP Short Term Goal 4 (Week 1): Patient will follow 2 step directions with moderate assist semantic cues  Skilled Therapeutic Interventions: Patient was upset with full staff supervision with meal this am; SLP re-educated patient on rationale for full supervision and patient verbalized understanding.  Patient consumed regular textures and thin liquids with occasional throat clear indicative of laryngeal penetration, SLP cued for hard coughs to facilitate removal of suspected penetration and patient then able to return demonstration throughout rest of meal.  Patient overall safe with p.o. And orders changed to intermittent staff supervision.  SLP facilitated session with moderate assist semantic and visual cues to place lunch and dinner order with use of menu to self monitor reading comprehension as well as accuracy of verbal expression.  Patient's verbal expression was characterized by perseverative phrases, non-specific descriptions of items as well as semantic and phonemic paraphasias.   Daily Session Precautions/Restrictions  Restrictions Weight Bearing Restrictions: No FIM:  Comprehension Comprehension Mode: Auditory Comprehension: 3-Understands basic 50 - 74% of the time/requires cueing 25 - 50%  of the time Expression Expression Mode: Verbal Expression: 3-Expresses  basic 50 - 74% of the time/requires cueing 25 - 50% of the time. Needs to repeat parts of sentences. Social Interaction Social Interaction: 3-Interacts appropriately 50 - 74% of the time - May be physically or verbally inappropriate. Problem Solving Problem Solving: 2-Solves basic 25 - 49% of the time - needs direction more than half the time to initiate, plan or complete simple activities Memory Memory: 2-Recognizes or recalls 25 - 49% of the time/requires cueing 51 - 75% of the time FIM - Eating Eating Activity: 5: Set-up assist for open containers;5: Supervision/cues General    Pain Pain Assessment Pain Assessment: No/denies pain Pain Score: 0-No pain  Therapy/Group: Individual Therapy  Charlane Ferretti., CCC-SLP [phone]  Kairee Kozma 06/11/2011, 11:26 AM

## 2011-06-11 NOTE — Progress Notes (Signed)
ANTICOAGULATION CONSULT NOTE - FOLLOW UP  Pharmacy Consult: Coumadin Indication: atrial fibrillation  No Known Allergies  Patient Measurements: Height: 6\' 1"  (185.4 cm) Weight: 189 lb 12.8 oz (86.093 kg) IBW/kg (Calculated) : 79.9   Vital Signs: Temp: 98.3 F (36.8 C) (03/22 0500) Temp src: Axillary (03/22 0500) BP: 106/66 mmHg (03/22 0500) Pulse Rate: 65  (03/22 0500)  Labs:  Basename 06/11/11 0630 06/10/11 0639 06/09/11 0617  HGB -- -- --  HCT -- -- --  PLT -- -- --  APTT -- -- --  LABPROT 26.0* 23.5* 18.3*  INR 2.34* 2.05* 1.49  HEPARINUNFRC -- -- --  CREATININE 0.63 0.68 0.69  CKTOTAL -- -- --  CKMB -- -- --  TROPONINI -- -- --   Estimated Creatinine Clearance: 94.3 ml/min (by C-G formula based on Cr of 0.63).  Assessment: 71 yo M s/p stroke with atrial fibrillation requiring initiation of Coumadin for further stroke prevention. Noted drug interaction with Dilantin -- Coumadin metabolism will be inhibited initially then induced so expect INR to rapidly increase then become stable.   INR therapeutic. No bleeding noted.    Goal of Therapy:  INR 2-3    Plan:  - Coumadin 3 mg PO again today - Daily PT/INR - Consider Dilantin level on 06/15/11  Christoper Fabian, PharmD, BCPS Clinical pharmacist, pager [phone] 06/11/2011 1:08 PM

## 2011-06-12 LAB — BASIC METABOLIC PANEL
CO2: 28 mEq/L (ref 19–32)
Calcium: 8.4 mg/dL (ref 8.4–10.5)
Chloride: 109 mEq/L (ref 96–112)
Creatinine, Ser: 0.65 mg/dL (ref 0.50–1.35)
GFR calc Af Amer: >90 mL/min (ref >90)
Sodium: 144 mEq/L (ref 135–145)

## 2011-06-12 LAB — PROTIME-INR
INR: 2.81 — ABNORMAL HIGH (ref 0.00–1.49)
Prothrombin Time: 30 seconds — ABNORMAL HIGH (ref 11.6–15.2)

## 2011-06-12 MED ORDER — WARFARIN SODIUM 3 MG PO TABS
3.0000 mg | ORAL_TABLET | Freq: Once | ORAL | Status: AC
  Administered 2011-06-12: 3 mg via ORAL
  Filled 2011-06-12: qty 1

## 2011-06-12 NOTE — Progress Notes (Signed)
Speech Language Pathology Daily Session Note  Patient Details  Name: [name] MRN: [id number] Date of Birth: September 20, 1940  Today's Date: 06/12/2011 Time: 4098-1191 Time Calculation (min): 45 min  Short Term Goals:  SLP Short Term Goal 1 (Week 1): Patient will consume Dys.3 textures and thin liquids with supervision assist for carryover of compensatory strategies SLP Short Term Goal 2 (Week 1): Patient will request help as needed with BADLs with moderate assist semantic cues SLP Short Term Goal 3 (Week 1): Patient will utilize description during moments of word finding with basic information with mod I  SLP Short Term Goal 4 (Week 1): Patient will follow 2 step directions with moderate assist semantic cues  Skilled Therapeutic Interventions:  Pt re-educated on SLP's role in care at beginning of session. Patient consumed breakfast of Dys. 3 textures and thin liquids with coughing episode X 2 and intermittent throat clearing secondary to large sips of liquid and talking while oral cavity was full. Min A verbal and questioning cues needed to utilize swallowing compensatory strategies. Pt continues to demonstrate little awareness/insight into swallowing, language, and cognitive deficits and needs Max A contextual, semantic and questioning cues. Pt engaging clinician in functional conversation and required Mod A verbal and questioning cues to self monitor and correct errors.    Daily Session Precautions/Restrictions  Restrictions Weight Bearing Restrictions: No FIM:  Comprehension Comprehension Mode: Auditory Comprehension: 3-Understands basic 50 - 74% of the time/requires cueing 25 - 50%  of the time Expression Expression Mode: Verbal Expression: 3-Expresses basic 50 - 74% of the time/requires cueing 25 - 50% of the time. Needs to repeat parts of sentences. Social Interaction Social Interaction: 4-Interacts appropriately 75 - 89% of the time - Needs redirection for appropriate  language or to initiate interaction. Problem Solving Problem Solving: 3-Solves basic 50 - 74% of the time/requires cueing 25 - 49% of the time Memory Memory: 2-Recognizes or recalls 25 - 49% of the time/requires cueing 51 - 75% of the time FIM - Eating Eating Activity: 5: Supervision/cues Pain Pain Assessment Pain Assessment: No/denies pain  Therapy/Group: Individual Therapy  Zerline Melchior 06/12/2011, 8:48 AM

## 2011-06-12 NOTE — Progress Notes (Addendum)
ANTICOAGULATION CONSULT NOTE - FOLLOW UP  Pharmacy Consult: Coumadin Indication: atrial fibrillation  No Known Allergies  Patient Measurements: Height: 6\' 1"  (185.4 cm) Weight: 189 lb 12.8 oz (86.093 kg) IBW/kg (Calculated) : 79.9   Vital Signs: Temp: 98.8 F (37.1 C) (03/23 0500) Temp src: Oral (03/23 0500) BP: 115/72 mmHg (03/23 0500) Pulse Rate: 102  (03/23 0500)  Labs:  Basename 06/12/11 0500 06/11/11 0630 06/10/11 0639  HGB -- -- --  HCT -- -- --  PLT -- -- --  APTT -- -- --  LABPROT 30.0* 26.0* 23.5*  INR 2.81* 2.34* 2.05*  HEPARINUNFRC -- -- --  CREATININE 0.65 0.63 0.68  CKTOTAL -- -- --  CKMB -- -- --  TROPONINI -- -- --   Estimated Creatinine Clearance: 94.3 ml/min (by C-G formula based on Cr of 0.65).  Assessment: 71 yo M s/p stroke with atrial fibrillation requiring initiation of Coumadin for further stroke prevention. Noted drug interaction with Dilantin -- Coumadin metabolism will be inhibited initially then induced so expect INR to rapidly increase then become stable.   INR therapeutic- at upper end of range. No bleeding noted.    Goal of Therapy:  INR 2-3   Plan:  - Coumadin 3 mg PO again today (plan to decrease 3/24 if INR continues to rise) - Daily PT/INR - Consider Dilantin level on 06/15/11  Jill Side L. Illene Bolus, PharmD, BCPS Clinical Pharmacist Pager: [phone] 06/12/2011 10:15 AM

## 2011-06-12 NOTE — Progress Notes (Signed)
Physical Therapy Note  Patient Details  Name: [name] MRN: [id number] Date of Birth: Feb 12, 1941 Today's Date: 06/12/2011  1430-1555 (85 minutes) individual Pain- no complaint of pain Focus of treatment: Neuro re-ed RT LE; gait training with RW Neuro re-ed: RT LE hip flexion/extension (gravity eliminated using powder board), knee flexion/extension( using powder board and skate); hip ER/IR in hooklying; hip ab/adduction AA; bridging: ;placing RT LE on/off mat using SLR 3 X 5; Nustep (ergonometer) X 5 minutes Level 3, X 5 minutes Level 4 with assist to maintain RT foot on pedal; transfers vcing for setup and min assist for squat/pivot; sit to stand with max cues for hand placement (not pulling on AD); Gait 80 feet , 110 feet RW min assist with ace wrap RT ankle and mod vcs for sequencing .      Paulino Cork,JIM 06/12/2011, 4:04 PM

## 2011-06-12 NOTE — Progress Notes (Signed)
Occupational Therapy Note  Patient Details  Name: PERRI LAMAGNA MRN: [id number] Date of Birth: 01-Dec-1940 Today's Date: 06/12/2011  Time In:  0930 Time Out:  1037  Individual session.  Pt denies any pain.  Treatment focus on ADL retraining at sink level (pat refused shower despite encouragement) with emphasis on bed to wheelchair transfers, chair to toilet transfers, toileting, standing balance, safety, judgement and insight,decreasing impulsivity,  increasing active use of right lower extremity. Patient does not understand why he needs staff with him during activities requiring mobility and repeatedly asks to staff to leave.  Continue to explain to patient that right now his leg is not working well and he is in danger of falling. Patient will need frequent reminders secondary to short term memory issues and poor insight, however is agreeable.  Provided as much privacy as possible for patient while maintaining safety.  Patient also does better with choices.  Spoke with nursing about using 4 rails up in bed secondary to patient's cognitive issues. RN to follow up with MD for order.    Norton Pastel 06/12/2011, 10:58 AM

## 2011-06-12 NOTE — Progress Notes (Signed)
Patient ID: [name], male   DOB: 02-Feb-1941, 71 y.o.   MRN: [id number] Patient ID: [name], male   DOB: Oct 22, 1940, 71 y.o.   MRN: [id number]  Subjective/Complaints: Diarrhea ongoing after D/C of Senna No N/V or abd pain No new complaints  Objective: Vital Signs: Blood pressure 115/72, pulse 102, temperature 98.8 F (37.1 C), temperature source Oral, resp. rate 19, height 6\' 1"  (1.854 m), weight 189 lb 12.8 oz (86.093 kg), SpO2 95.00%. No results found. Results for orders placed during the hospital encounter of 06/07/11 (from the past 72 hour(s))  PROTIME-INR     Status: Abnormal   Collection Time   06/10/11  6:39 AM      Component Value Range Comment   Prothrombin Time 23.5 (*) 11.6 - 15.2 (seconds)    INR 2.05 (*) 0.00 - 1.49    BASIC METABOLIC PANEL     Status: Abnormal   Collection Time   06/10/11  6:39 AM      Component Value Range Comment   Sodium 143  135 - 145 (mEq/L)    Potassium 3.0 (*) 3.5 - 5.1 (mEq/L)    Chloride 107  96 - 112 (mEq/L)    CO2 27  19 - 32 (mEq/L)    Glucose, Bld 104 (*) 70 - 99 (mg/dL)    Dominguez 9  6 - 23 (mg/dL)    Creatinine, Ser 7.82  0.50 - 1.35 (mg/dL)    Calcium 8.6  8.4 - 10.5 (mg/dL)    GFR calc non Af Amer >90  >90 (mL/min)    GFR calc Af Amer >90  >90 (mL/min)   PROTIME-INR     Status: Abnormal   Collection Time   06/11/11  6:30 AM      Component Value Range Comment   Prothrombin Time 26.0 (*) 11.6 - 15.2 (seconds)    INR 2.34 (*) 0.00 - 1.49    BASIC METABOLIC PANEL     Status: Abnormal   Collection Time   06/11/11  6:30 AM      Component Value Range Comment   Sodium 144  135 - 145 (mEq/L)    Potassium 3.0 (*) 3.5 - 5.1 (mEq/L)    Chloride 108  96 - 112 (mEq/L)    CO2 26  19 - 32 (mEq/L)    Glucose, Bld 126 (*) 70 - 99 (mg/dL)    Dominguez 9  6 - 23 (mg/dL)    Creatinine, Ser 9.56  0.50 - 1.35 (mg/dL)    Calcium 8.5  8.4 - 10.5 (mg/dL)    GFR calc non Af Amer >90  >90 (mL/min)    GFR calc Af Amer >90  >90 (mL/min)     PROTIME-INR     Status: Abnormal   Collection Time   06/12/11  5:00 AM      Component Value Range Comment   Prothrombin Time 30.0 (*) 11.6 - 15.2 (seconds)    INR 2.81 (*) 0.00 - 1.49    BASIC METABOLIC PANEL     Status: Abnormal   Collection Time   06/12/11  5:00 AM      Component Value Range Comment   Sodium 144  135 - 145 (mEq/L)    Potassium 3.4 (*) 3.5 - 5.1 (mEq/L)    Chloride 109  96 - 112 (mEq/L)    CO2 28  19 - 32 (mEq/L)    Glucose, Bld 89  70 - 99 (mg/dL)    Dominguez 11  6 -  23 (mg/dL)    Creatinine, Ser 2.95  0.50 - 1.35 (mg/dL)    Calcium 8.4  8.4 - 10.5 (mg/dL)    GFR calc non Af Amer >90  >90 (mL/min)    GFR calc Af Amer >90  >90 (mL/min)     Physical Exam  Nursing note and vitals reviewed.  Constitutional: He appears well-developed.  HENT: PERRL, EOMI  Head: Normocephalic.  Neck: Normal range of motion. No thyromegaly present.  Cardiovascular: Normal rate.  Pulmonary/Chest: Breath sounds normal. He has no wheezes.  Abdominal: He exhibits no distension. There is no tenderness.  Musculoskeletal: He exhibits no edema.  RLE with 1+ edema  Neurological: He is alert.  Pt alert. Follows simple commnds, needing cueing at times. Right facial droop and tongue deviation. Speech dysarthric but Intelligible. He has mild verbal and motor apraxia.. Patient is oriented x3 with good recall. Diminished pain sense on the right side.Light touch intact RUE 4/5. RLE 0-1/5. Mild right pronator drift and decreased FMC on right. DTR's 3+ on right. Toes up.  Skin: Skin is warm and dry     Assessment/Plan: 1. Functional deficits secondary to L ACA infarct with RLE paresis which require 3+ hours per day of interdisciplinary therapy in a comprehensive inpatient rehab setting. Physiatrist is providing close team supervision and 24 hour management of active medical problems listed below. Physiatrist and rehab team continue to assess barriers to discharge/monitor patient progress toward  functional and medical goals. FIM: FIM - Bathing Bathing Steps Patient Completed: Chest;Right Arm;Left Arm;Abdomen;Front perineal area;Buttocks;Right upper leg;Left upper leg;Right lower leg (including foot);Left lower leg (including foot) Bathing: 4: Steadying assist  FIM - Upper Body Dressing/Undressing Upper body dressing/undressing steps patient completed: Thread/unthread right sleeve of pullover shirt/dresss;Thread/unthread left sleeve of pullover shirt/dress;Put head through opening of pull over shirt/dress;Pull shirt over trunk Upper body dressing/undressing: 5: Set-up assist to: Obtain clothing/put away FIM - Lower Body Dressing/Undressing Lower body dressing/undressing steps patient completed: Thread/unthread left underwear leg;Pull underwear up/down;Thread/unthread left pants leg Lower body dressing/undressing: 2: Max-Patient completed 25-49% of tasks  FIM - Toileting Toileting steps completed by patient: Adjust clothing prior to toileting;Performs perineal hygiene Toileting Assistive Devices: Grab bar or rail for support Toileting: 4: Steadying assist  FIM - Diplomatic Services operational officer Devices: Bedside commode;Grab bars (BSC over toilet) Toilet Transfers: 3-To toilet/BSC: Mod A (lift or lower assist);3-From toilet/BSC: Mod A (lift or lower assist)  FIM - Bed/Chair Transfer Bed/Chair Transfer Assistive Devices: HOB elevated Bed/Chair Transfer: 3: Supine > Sit: Mod A (lifting assist/Pt. 50-74%/lift 2 legs;3: Sit > Supine: Mod A (lifting assist/Pt. 50-74%/lift 2 legs);3: Bed > Chair or W/C: Mod A (lift or lower assist);3: Chair or W/C > Bed: Mod A (lift or lower assist)  FIM - Locomotion: Wheelchair Distance: 150 Locomotion: Wheelchair: 5: Travels 150 ft or more: maneuvers on rugs and over door sills with supervision, cueing or coaxing FIM - Locomotion: Ambulation Locomotion: Ambulation Assistive Devices: Walker - Rolling (ace wrap Rt ankle) Ambulation/Gait  Assistance: 3: Mod assist Locomotion: Ambulation: 0: Activity did not occur  Comprehension Comprehension Mode: Auditory;Visual Comprehension: 4-Understands basic 75 - 89% of the time/requires cueing 10 - 24% of the time  Expression Expression Mode: Verbal Expression: 3-Expresses basic 50 - 74% of the time/requires cueing 25 - 50% of the time. Needs to repeat parts of sentences.  Social Interaction Social Interaction: 5-Interacts appropriately 90% of the time - Needs monitoring or encouragement for participation or interaction.  Problem Solving Problem Solving: 4-Solves basic  75 - 89% of the time/requires cueing 10 - 24% of the time  Memory Memory: 2-Recognizes or recalls 25 - 49% of the time/requires cueing 51 - 75% of the time  Medical Problem List and Plan:  1. 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 DVT Prophylaxis/Anticoagulation: Chronic Coumadin therapy. Monitor for any signs of bleeding  3. Recurrent seizure. Continue Dilantin and Keppra therapy as per neurology services. Will need followup Dilantin level in next couple days. Monitor for any further seizure activity.  4. Dysphagia. Followup per speech therapy.Pt advanced to D3 diet. Monitor for any signs of aspiration  5. Atrial flutter with RVR. Continue Cardizem and follow up cardiology services. HR controlled at present.  6.  Loose stools improving off senna but ongoing issues  Check c diff, 1 loose stool on 3/21  7.  Hypokalemia due to #6 increase po supplement may resume PT/OT  LOS (Days) 5 A FACE TO FACE EVALUATION WAS PERFORMED  Alex Josclyn Rosales 06/12/2011, 8:11 AM    Review of Systems  Cardiovascular: Negative for chest pain.  Gastrointestinal: Positive for diarrhea. Negative for nausea, vomiting and abdominal pain.  Neurological: Positive for focal weakness.  All other systems reviewed and are negative.

## 2011-06-13 LAB — PROTIME-INR
INR: 2.85 — ABNORMAL HIGH (ref 0.00–1.49)
Prothrombin Time: 30.4 seconds — ABNORMAL HIGH (ref 11.6–15.2)

## 2011-06-13 MED ORDER — WARFARIN SODIUM 3 MG PO TABS
3.0000 mg | ORAL_TABLET | Freq: Every day | ORAL | Status: DC
  Administered 2011-06-13 – 2011-06-15 (×3): 3 mg via ORAL
  Filled 2011-06-13 (×4): qty 1

## 2011-06-13 NOTE — Progress Notes (Signed)
Physical Therapy Note  Patient Details  Name: [name] MRN: [id number] Date of Birth: July 09, 1940 Today's Date: 06/13/2011  1000-1055 (55 minutes) individual Pain- no complaint of pain Focus of treatment: Therapeutic exercise/neuro re-ed Rt LE in closed chain; gait training with RW Treatment: Kinetron in sitting for RT LE strengthening/ in standing with bilateral UE support with tactile cues to attain full terminal knee extension in stance on right ; sit to stand focusing on increased forward lean and equal weightbearing bilateral LEs (hands on knees); gait 160 feet X 2 with RW (ace wrap RT ankle) min assist with improving increased step length on right.   Quyen Cutsforth,JIM 06/13/2011, 3:32 PM

## 2011-06-13 NOTE — Progress Notes (Signed)
Patient appears to be resting well tonight. Woke up around midnight and turned on the TV, then fell asleep shortly afterward. Patient currently sleeping in the bed.

## 2011-06-13 NOTE — Progress Notes (Signed)
Patient calm and resting in bed. No complaints of pain. No unsafe behavior seen. 4 siderails up discontinued. Will monitor.

## 2011-06-13 NOTE — Progress Notes (Signed)
ANTICOAGULATION CONSULT NOTE - FOLLOW UP  Pharmacy Consult: Coumadin Indication: atrial fibrillation  No Known Allergies  Patient Measurements: Height: 6\' 1"  (185.4 cm) Weight: 189 lb 12.8 oz (86.093 kg) IBW/kg (Calculated) : 79.9   Vital Signs: Temp: 98.9 F (37.2 C) (03/24 0500) Temp src: Oral (03/24 0500) BP: 116/64 mmHg (03/24 0500) Pulse Rate: 90  (03/24 0500)  Labs:  Basename 06/13/11 0650 06/12/11 0500 06/11/11 0630  HGB -- -- --  HCT -- -- --  PLT -- -- --  APTT -- -- --  LABPROT 30.4* 30.0* 26.0*  INR 2.85* 2.81* 2.34*  HEPARINUNFRC -- -- --  CREATININE -- 0.65 0.63  CKTOTAL -- -- --  CKMB -- -- --  TROPONINI -- -- --   Estimated Creatinine Clearance: 94.3 ml/min (by C-G formula based on Cr of 0.65).  Assessment: 71 yo M s/p stroke with atrial fibrillation requiring initiation of Coumadin for further stroke prevention. Noted drug interaction with Dilantin -- Coumadin metabolism will be inhibited initially then induced so expect INR to rapidly increase then become stable.   INR therapeutic- at upper end of range. No bleeding noted.    Goal of Therapy:  INR 2-3   Plan:  - Try scheduled Coumadin 3 mg PO qday - Daily PT/INR - Consider Dilantin level on 06/15/11  Esaias Cleavenger L. Illene Bolus, PharmD, BCPS Clinical Pharmacist Pager: [phone] 06/13/2011 10:25 AM

## 2011-06-13 NOTE — Progress Notes (Signed)
Patient ID: [name], male   DOB: [birth year], 71 y.o.   MRN: [id number] Patient ID: AADIT HAGOOD, male   DOB: [birth year], 71 y.o.   MRN: [id number] Patient ID: CHAYSE ZATARAIN, male   DOB: 06/04/1940, 71 y.o.   MRN: [id number]  Subjective/Complaints: Diarrhea seems to have resolved per pt No N/V or abd pain No new complaints  Objective: Vital Signs: Blood pressure 116/64, pulse 90, temperature 98.9 F (37.2 C), temperature source Oral, resp. rate 18, height 6\' 1"  (1.854 m), weight 189 lb 12.8 oz (86.093 kg), SpO2 94.00%. No results found. Results for orders placed during the hospital encounter of 06/07/11 (from the past 72 hour(s))  PROTIME-INR     Status: Abnormal   Collection Time   06/11/11  6:30 AM      Component Value Range Comment   Prothrombin Time 26.0 (*) 11.6 - 15.2 (seconds)    INR 2.34 (*) 0.00 - 1.49    BASIC METABOLIC PANEL     Status: Abnormal   Collection Time   06/11/11  6:30 AM      Component Value Range Comment   Sodium 144  135 - 145 (mEq/L)    Potassium 3.0 (*) 3.5 - 5.1 (mEq/L)    Chloride 108  96 - 112 (mEq/L)    CO2 26  19 - 32 (mEq/L)    Glucose, Bld 126 (*) 70 - 99 (mg/dL)    Dominguez 9  6 - 23 (mg/dL)    Creatinine, Ser 7.84  0.50 - 1.35 (mg/dL)    Calcium 8.5  8.4 - 10.5 (mg/dL)    GFR calc non Af Amer >90  >90 (mL/min)    GFR calc Af Amer >90  >90 (mL/min)   PROTIME-INR     Status: Abnormal   Collection Time   06/12/11  5:00 AM      Component Value Range Comment   Prothrombin Time 30.0 (*) 11.6 - 15.2 (seconds)    INR 2.81 (*) 0.00 - 1.49    BASIC METABOLIC PANEL     Status: Abnormal   Collection Time   06/12/11  5:00 AM      Component Value Range Comment   Sodium 144  135 - 145 (mEq/L)    Potassium 3.4 (*) 3.5 - 5.1 (mEq/L)    Chloride 109  96 - 112 (mEq/L)    CO2 28  19 - 32 (mEq/L)    Glucose, Bld 89  70 - 99 (mg/dL)    Dominguez 11  6 - 23 (mg/dL)    Creatinine, Ser 6.96  0.50 - 1.35 (mg/dL)    Calcium 8.4  8.4 - 10.5 (mg/dL)    GFR calc non Af Amer >90  >90 (mL/min)    GFR calc Af Amer >90  >90 (mL/min)   PROTIME-INR     Status: Abnormal   Collection Time   06/13/11  6:50 AM      Component Value Range Comment   Prothrombin Time 30.4 (*) 11.6 - 15.2 (seconds)    INR 2.85 (*) 0.00 - 1.49      Physical Exam  Nursing note and vitals reviewed.  Constitutional: He appears well-developed.  HENT: PERRL, EOMI  Head: Normocephalic.  Neck: Normal range of motion. No thyromegaly present.  Cardiovascular: Normal rate.  Pulmonary/Chest: Breath sounds normal. He has no wheezes.  Abdominal: He exhibits no distension. There is no tenderness.  Musculoskeletal: He exhibits no edema.  RLE with 1+ edema  Neurological: He is alert.  Pt alert. Follows simple commnds, needing cueing at times. Right facial droop and tongue deviation. Speech dysarthric but Intelligible. He has mild verbal and motor apraxia.. Patient is oriented x3 with good recall. Diminished pain sense on the right side.Light touch intact RUE 4/5. RLE 0-1/5. Mild right pronator drift and decreased FMC on right. DTR's 3+ on right. Toes up.  Skin: Skin is warm and dry     Assessment/Plan: 1. Functional deficits secondary to L ACA infarct with RLE paresis which require 3+ hours per day of interdisciplinary therapy in a comprehensive inpatient rehab setting. Physiatrist is providing close team supervision and 24 hour management of active medical problems listed below. Physiatrist and rehab team continue to assess barriers to discharge/monitor patient progress toward functional and medical goals. FIM: FIM - Bathing Bathing Steps Patient Completed: Chest;Right Arm;Left Arm;Abdomen;Front perineal area;Buttocks;Right upper leg;Left lower leg (including foot);Right lower leg (including foot);Left upper leg Bathing: 4: Steadying assist (vc's for right foot placement prior to standing)  FIM - Upper Body Dressing/Undressing Upper body dressing/undressing steps patient  completed: Thread/unthread right sleeve of pullover shirt/dresss;Thread/unthread left sleeve of pullover shirt/dress;Put head through opening of pull over shirt/dress Upper body dressing/undressing: 5: Set-up assist to: Obtain clothing/put away FIM - Lower Body Dressing/Undressing Lower body dressing/undressing steps patient completed: Thread/unthread left underwear leg;Pull underwear up/down;Thread/unthread right pants leg;Pull pants up/down;Don/Doff left sock (pt did not want to wear shoes) Lower body dressing/undressing: 2: Max-Patient completed 25-49% of tasks  FIM - Toileting Toileting steps completed by patient: Performs perineal hygiene Toileting Assistive Devices: Grab bar or rail for support Toileting: 2: Max-Patient completed 1 of 3 steps (pt assisted with clothes mgmt)  FIM - Diplomatic Services operational officer Devices: Bedside commode;Grab bars (used over toilet) Toilet Transfers: 3-To toilet/BSC: Mod A (lift or lower assist);3-From toilet/BSC: Mod A (lift or lower assist) (cues for right LLE placement prior to standing)  FIM - Bed/Chair Transfer Bed/Chair Transfer Assistive Devices: HOB elevated Bed/Chair Transfer: 3: Bed > Chair or W/C: Mod A (lift or lower assist);4: Chair or W/C > Bed: Min A (steadying Pt. > 75%)  FIM - Locomotion: Wheelchair Distance: 150 Locomotion: Wheelchair: 5: Travels 150 ft or more: maneuvers on rugs and over door sills with supervision, cueing or coaxing FIM - Locomotion: Ambulation Locomotion: Ambulation Assistive Devices: Walker - Rolling (ace wrap Rt ankle) Ambulation/Gait Assistance: 3: Mod assist Locomotion: Ambulation: 0: Activity did not occur  Comprehension Comprehension Mode: Auditory Comprehension: 4-Understands basic 75 - 89% of the time/requires cueing 10 - 24% of the time  Expression Expression Mode: Verbal Expression: 3-Expresses basic 50 - 74% of the time/requires cueing 25 - 50% of the time. Needs to repeat parts of  sentences.  Social Interaction Social Interaction: 4-Interacts appropriately 75 - 89% of the time - Needs redirection for appropriate language or to initiate interaction.  Problem Solving Problem Solving: 3-Solves basic 50 - 74% of the time/requires cueing 25 - 49% of the time  Memory Memory: 2-Recognizes or recalls 25 - 49% of the time/requires cueing 51 - 75% of the time  Medical Problem List and Plan:  1. 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 DVT Prophylaxis/Anticoagulation: Chronic Coumadin therapy. Monitor for any signs of bleeding  3. Recurrent seizure. Continue Dilantin and Keppra therapy as per neurology services. Will need followup Dilantin level in next couple days. Monitor for any further seizure activity.  4. Dysphagia. Followup per speech therapy.Pt  advanced to D3 diet. Monitor for any signs of aspiration  5. Atrial flutter with RVR. Continue Cardizem and follow up cardiology services. HR controlled at present.  6.  Loose stools, resolved 7.  Hypokalemia due to #6 increase po supplement may resume PT/OT  LOS (Days) 6 A FACE TO FACE EVALUATION WAS PERFORMED  Alex Callista Hoh 06/13/2011, 8:53 AM    Review of Systems  Cardiovascular: Negative for chest pain.  Gastrointestinal: Positive for diarrhea. Negative for nausea, vomiting and abdominal pain.  Neurological: Positive for focal weakness.  All other systems reviewed and are negative.

## 2011-06-14 LAB — PROTIME-INR: INR: 2.63 — ABNORMAL HIGH (ref 0.00–1.49)

## 2011-06-14 MED ORDER — POTASSIUM CHLORIDE CRYS ER 10 MEQ PO TBCR
10.0000 meq | EXTENDED_RELEASE_TABLET | Freq: Two times a day (BID) | ORAL | Status: DC
  Administered 2011-06-14 – 2011-06-29 (×31): 10 meq via ORAL
  Filled 2011-06-14 (×33): qty 1

## 2011-06-14 MED ORDER — SPIRONOLACTONE 25 MG PO TABS
25.0000 mg | ORAL_TABLET | Freq: Every day | ORAL | Status: DC
  Administered 2011-06-14 – 2011-06-17 (×4): 25 mg via ORAL
  Filled 2011-06-14 (×5): qty 1

## 2011-06-14 NOTE — Progress Notes (Signed)
Physical Therapy Note  Patient Details  Name: RAYNARD MAPPS MRN: [id number] Date of Birth: 11-13-1940 Today's Date: 06/14/2011  4098-1191 (55 minutes) individual Pain; no complaint of pain Focus of treatment: Neuro re ed Rt LE; gait training with AD; therapeutic activities to improve dynamic standing balance without AD Treatment: Neuro re-ed Rt LE with manual resistance to hip flexion/extension in sidelying (gravity eliminated) ; supine RT SLR with minimal quad lag 2 X 10; AA supine hip ab/adduction X 15; hooklying Rt hip ER/IR; sit to stand hands on knees min to close supervision from mat x 10; standing reaching activity to right using horseshoes and reaching to basketball hoop min assist; gait 80 feet X 2 RW min assist with vcs for heel /toe gait (pt now able to initiate DF RT ankle) and hip extension in stance.   Marquavious Nazar,JIM 06/14/2011, 11:17 AM

## 2011-06-14 NOTE — Progress Notes (Signed)
Physical Therapy Note  Patient Details  Name: [name] MRN: [id number] Date of Birth: [birth year] Today's Date: 06/14/2011  4098-1191 (40 minutes) individual Pain- no complaint of pain Focus of treatment: Therapeutic activities for closed chain strengthening RT LE; gait training/ endurance with RW on level Treatment : Kinetron in sitting X 30 reps; in standing with UE support 2 X 20 reps with tactile cues to attain full terminal knee extension on right; Kinetron X 3 (20 reps)  in standing with hands on therapists shoulders to facilitate trunk/hip extension in stance on right; gait -130 feet min assist rw (gym to room) with vcs for heel/toe gait and safe turning and stepping backward with RW.   Manvir Prabhu,JIM 06/14/2011, 3:35 PM

## 2011-06-14 NOTE — Progress Notes (Signed)
Patient ID: [name], male   DOB: October 05, 1940, 71 y.o.   MRN: [id number]  Subjective/Complaints: No diarrhea for several days.    .ros  Objective: Vital Signs: Blood pressure 115/69, pulse 96, temperature 98.6 F (37 C), temperature source Oral, resp. rate 18, height 6\' 1"  (1.854 m), weight 86.093 kg (189 lb 12.8 oz), SpO2 97.00%. No results found. Results for orders placed during the hospital encounter of 06/07/11 (from the past 72 hour(s))  PROTIME-INR     Status: Abnormal   Collection Time   06/12/11  5:00 AM      Component Value Range Comment   Prothrombin Time 30.0 (*) 11.6 - 15.2 (seconds)    INR 2.81 (*) 0.00 - 1.49    BASIC METABOLIC PANEL     Status: Abnormal   Collection Time   06/12/11  5:00 AM      Component Value Range Comment   Sodium 144  135 - 145 (mEq/L)    Potassium 3.4 (*) 3.5 - 5.1 (mEq/L)    Chloride 109  96 - 112 (mEq/L)    CO2 28  19 - 32 (mEq/L)    Glucose, Bld 89  70 - 99 (mg/dL)    Dominguez 11  6 - 23 (mg/dL)    Creatinine, Ser 4.09  0.50 - 1.35 (mg/dL)    Calcium 8.4  8.4 - 10.5 (mg/dL)    GFR calc non Af Amer >90  >90 (mL/min)    GFR calc Af Amer >90  >90 (mL/min)   PROTIME-INR     Status: Abnormal   Collection Time   06/13/11  6:50 AM      Component Value Range Comment   Prothrombin Time 30.4 (*) 11.6 - 15.2 (seconds)    INR 2.85 (*) 0.00 - 1.49      Physical Exam  Nursing note and vitals reviewed.  Constitutional: He appears well-developed.  HENT: PERRL, EOMI  Head: Normocephalic.  Neck: Normal range of motion. No thyromegaly present.  Cardiovascular: Normal rate.  Pulmonary/Chest: Breath sounds normal. He has no wheezes.  Abdominal: He exhibits no distension. There is no tenderness.  Musculoskeletal: He exhibits no edema.  RLE with 2+ edema  Neurological: He is alert.  Pt alert. Follows simple commnds, needing cueing at times. Right facial droop and tongue deviation. Speech dysarthric but Intelligible. He has mild verbal and motor  apraxia.. Patient is oriented x3 with good recall. Diminished pain sense on the right side.Light touch intact RUE 4/5. RLE 0-1/5. Mild right pronator drift and decreased FMC on right. DTR's 3+ on right. Toes up.  Skin: Skin is warm and dry     Assessment/Plan: 1. Functional deficits secondary to L ACA infarct with RLE paresis which require 3+ hours per day of interdisciplinary therapy in a comprehensive inpatient rehab setting. Physiatrist is providing close team supervision and 24 hour management of active medical problems listed below. Physiatrist and rehab team continue to assess barriers to discharge/monitor patient progress toward functional and medical goals. FIM: FIM - Bathing Bathing Steps Patient Completed: Chest;Right Arm;Left Arm;Abdomen;Front perineal area;Buttocks;Right upper leg;Left upper leg;Left lower leg (including foot);Right lower leg (including foot) Bathing: 5: Supervision: Safety issues/verbal cues  FIM - Upper Body Dressing/Undressing Upper body dressing/undressing steps patient completed: Thread/unthread left sleeve of pullover shirt/dress;Thread/unthread right sleeve of pullover shirt/dresss;Put head through opening of pull over shirt/dress Upper body dressing/undressing: 5: Set-up assist to: Obtain clothing/put away FIM - Lower Body Dressing/Undressing Lower body dressing/undressing steps patient completed: Thread/unthread left underwear leg;Thread/unthread  right underwear leg;Pull underwear up/down;Don/Doff left shoe Lower body dressing/undressing: 2: Max-Patient completed 25-49% of tasks  FIM - Toileting Toileting steps completed by patient: Performs perineal hygiene Toileting Assistive Devices: Grab bar or rail for support Toileting: 2: Max-Patient completed 1 of 3 steps  FIM - Diplomatic Services operational officer Devices: Therapist, music Transfers: 3-To toilet/BSC: Mod A (lift or lower assist)  FIM - Banker  Devices: HOB elevated Bed/Chair Transfer: 3: Bed > Chair or W/C: Mod A (lift or lower assist)  FIM - Locomotion: Wheelchair Distance: 150 Locomotion: Wheelchair: 5: Travels 150 ft or more: maneuvers on rugs and over door sills with supervision, cueing or coaxing FIM - Locomotion: Ambulation Locomotion: Ambulation Assistive Devices: Designer, industrial/product Ambulation/Gait Assistance: 4: Min assist Locomotion: Ambulation: 4: Travels 150 ft or more with minimal assistance (Pt.>75%)  Comprehension Comprehension Mode: Auditory Comprehension: 4-Understands basic 75 - 89% of the time/requires cueing 10 - 24% of the time  Expression Expression Mode: Verbal Expression: 3-Expresses basic 50 - 74% of the time/requires cueing 25 - 50% of the time. Needs to repeat parts of sentences.  Social Interaction Social Interaction: 4-Interacts appropriately 75 - 89% of the time - Needs redirection for appropriate language or to initiate interaction.  Problem Solving Problem Solving: 3-Solves basic 50 - 74% of the time/requires cueing 25 - 49% of the time  Memory Memory: 2-Recognizes or recalls 25 - 49% of the time/requires cueing 51 - 75% of the time  Medical Problem List and Plan:  1. 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 DVT Prophylaxis/Anticoagulation: Chronic Coumadin therapy. Monitor for any signs of bleeding  3. Recurrent seizure. Continue Dilantin and Keppra therapy as per neurology services. Will need followup Dilantin level in next couple days. Monitor for any further seizure activity.  4. Dysphagia. Followup per speech therapy.Pt advanced to D3 diet. Monitor for any signs of aspiration  5. Atrial flutter with RVR. Continue Cardizem and follow up cardiology services. HR controlled at present.  6.  Loose stools improving off senna but ongoing issues  Check c diff, 1 loose stool on 3/21  7.  Hypokalemia plus  pedal edema reduce KCL and start aldactone  LOS (Days) 7 A FACE TO FACE EVALUATION WAS PERFORMED  [name] E 06/14/2011, 7:12 AM    Review of Systems  Cardiovascular: Negative for chest pain.  Gastrointestinal: Negative for nausea, vomiting, abdominal pain and diarrhea.  Neurological: Positive for focal weakness.  All other systems reviewed and are negative.

## 2011-06-14 NOTE — Plan of Care (Signed)
Problem: RH SAFETY Goal: RH STG DECREASED RISK OF FALL WITH ASSISTANCE STG Decreased Risk of Fall With min. Assistance.  Outcome: Progressing Mod assist currently, working toward goal of min assist

## 2011-06-14 NOTE — Progress Notes (Signed)
Occupational Therapy Session Note  Patient Details  Name: [name] MRN: [id number] Date of Birth: [birth year]  Today's Date: 06/14/2011 Time: 0930-1025 Time Calculation (min): 55 min  Short Term Goals: Week 1:  OT Short Term Goal 1 (Week 1): Pt will complete bathing with min assist at sit to stand level in walk-in shower OT Short Term Goal 2 (Week 1): Pt will complete LB dressing with min assist at sit to stand level OT Short Term Goal 3 (Week 1): Pt will complete toilet transfer with min assist stand pivot OT Short Term Goal 4 (Week 1): Pt will complete grooming in standing with min assist  Skilled Therapeutic Interventions/Progress Updates:    Pt seen for ADL retraining with focus on safety with transfers and with sit <> stand.  Educated pt on w/c parts management with education on locking brakes prior to sit to stand and with LB dressing to ensure w/c doesn't move.  Pt attempted to stand with RLE on leg rest, educated on the need to move leg rests prior to standing.  Pt with short term memory deficits, continues to require cues for safety and w/c parts management.  Pt declined bathing stating that he had already completed that.  Focus on grooming and LB dressing in sit to stand manner.  Continues to require education on the need for staff with mobility secondary to Rt leg weakness, increased risk of falling, and safety awareness and insight.   Therapy Documentation Precautions:  Precautions Precautions: Fall Precaution Comments: Dys. 3 textures and thin liquids; significant RLE weakness Required Braces or Orthoses: No Restrictions Weight Bearing Restrictions: No Pain: Pain Assessment Pain Assessment: 0-10 Pain Score: 0-No pain   See FIM for current functional status  Therapy/Group: Individual Therapy  [name] 06/14/2011, 10:31 AM

## 2011-06-14 NOTE — Progress Notes (Signed)
ANTICOAGULATION CONSULT NOTE - Follow Up Consult  Pharmacy Consult for coumadin Indication: atrial fibrillation  Vital Signs: Temp: 98.6 F (37 C) (03/25 0534) Temp src: Oral (03/25 0534) BP: 115/69 mmHg (03/25 0534) Pulse Rate: 96  (03/25 0534)  Labs:  Basename 06/14/11 1050 06/13/11 0650 06/12/11 0500  HGB -- -- --  HCT -- -- --  PLT -- -- --  APTT -- -- --  LABPROT 28.5* 30.4* 30.0*  INR 2.63* 2.85* 2.81*  HEPARINUNFRC -- -- --  CREATININE -- -- 0.65  CKTOTAL -- -- --  CKMB -- -- --  TROPONINI -- -- --   Estimated Creatinine Clearance: 94.3 ml/min (by C-G formula based on Cr of 0.65).  Assessment: 71 yom s/p stroke w/ atrial fibrillation requiring initiation of coumadin for further stroke prevention. INR today remains therapeutic on dose of 3mg  daily. No bleeding noted. No CBC available.   Goal of Therapy:  INR 2-3   Plan:  1. Continue coumadin 3mg  PO x 1 tonight 2. F/u AM INR 3. Consider checking a dilantin level 3/26  Deltha Bernales, [name] 06/14/2011,1:38 PM

## 2011-06-14 NOTE — Plan of Care (Signed)
Problem: RH SAFETY Goal: RH STG DECREASED RISK OF FALL WITH ASSISTANCE STG Decreased Risk of Fall With min. Assistance.  Outcome: Not Progressing Fall today from w/c; added quick release belt to safety precautions

## 2011-06-14 NOTE — Progress Notes (Signed)
Per State Regulation 482.30 This chart was reviewed for medical necessity with respect to the patient's Admission/Duration of stay. Pt participating in therapies and making slow, steady gains. Having loose stools; on contact isolation, pending c-diff.  [name]                 Nurse Care Manager            Next Review Date: 06/17/11

## 2011-06-14 NOTE — Progress Notes (Signed)
Speech Language Pathology Daily Session Note  Patient Details  Name: [name] MRN: [id number] Date of Birth: 1940-09-21  Today's Date: 06/14/2011 Time: 1345-1430 Time Calculation (min): 45 min  Short Term Goals: Week 1: SLP Short Term Goal 1 (Week 1): Patient will consume Dys.3 textures and thin liquids with supervision assist for carryover of compensatory strategies SLP Short Term Goal 2 (Week 1): Patient will request help as needed with BADLs with moderate assist semantic cues SLP Short Term Goal 3 (Week 1): Patient will utilize description during moments of word finding with basic information with mod I  SLP Short Term Goal 4 (Week 1): Patient will follow 2 step directions with moderate assist semantic cues  Skilled Therapeutic Interventions: Patient seen for skilled treatment with focus on improved awareness, utilization of compensatory word finding strategies, and follow through with aspiration precautions/diet tolerance. Patient alert and pleasant. Able to self feed soft solid and thin liquids with minimal s/s of aspiration characterized by throat clearing, wet cough x2-3 during snack; suspected build-up of residuals as patient continues to require max verbal, tactile, and visual cues to utilize dry swallow in hopes of clearing pharyngeal residuals during po intake. Patient requiring supervision-minimal cues for small bites and slow rate however. Max assist required for improved intellectual awareness of deficits; ? Baseline as daughter reported that this was similar behavior prior to CVA. Patient able to self correct word finding error with 50% accuracy during basic conversations, max assist to utilize description strategy with those errors not spontaneously corrected.   Daily Session Precautions/Restrictions    FIM:  Comprehension Comprehension: 4-Understands basic 75 - 89% of the time/requires cueing 10 - 24% of the time Expression Expression: 3-Expresses basic 50 - 74% of  the time/requires cueing 25 - 50% of the time. Needs to repeat parts of sentences. Social Interaction Social Interaction: 4-Interacts appropriately 75 - 89% of the time - Needs redirection for appropriate language or to initiate interaction. Problem Solving Problem Solving: 3-Solves basic 50 - 74% of the time/requires cueing 25 - 49% of the time Memory Memory: 2-Recognizes or recalls 25 - 49% of the time/requires cueing 51 - 75% of the time General    Pain Pain Assessment Pain Assessment: No/denies pain Pain Score: 0-No pain Cognition:   Oral/Motor: Oral Motor/Sensory Function Overall Oral Motor/Sensory Function: Impaired Labial ROM: Reduced right Labial Symmetry: Abnormal symmetry right Labial Strength: Reduced Lingual ROM: Reduced right Lingual Strength: Reduced Facial ROM: Reduced right Motor Speech Overall Motor Speech: Appears within functional limits for tasks assessed (basic expression ) Respiration: Within functional limits Resonance: Within functional limits Articulation: Within functional limitis Intelligibility: Intelligible Motor Planning: Not tested Interfering Components: Hearing loss (sister reports HOH) Comprehension: Auditory Comprehension Overall Auditory Comprehension: Impaired Yes/No Questions: Impaired Complex Questions: 75-100% accurate (80%) Commands: Impaired Two Step Basic Commands: 50-74% accurate Interfering Components: Attention;Processing speed;Working Radio broadcast assistant: Extra processing time;Visual/Gestural cues;Other (Comment) (written aids) Visual Recognition/Discrimination Discrimination: Not tested Reading Comprehension Reading Status: Not tested Expression: Expression Primary Mode of Expression: Verbal Verbal Expression Overall Verbal Expression: Impaired Level of Generative/Spontaneous Verbalization: Phrase;Sentence Repetition: No impairment Verbal Errors: Not aware of errors (word finding and circumlocution during verbal  expression ) Pragmatics: Impairment Impairments: Interpretation of nonverbal communication;Topic maintenance Interfering Components: Premorbid deficit Written Expression Dominant Hand: Right Written Expression: Within Functional Limits Dictation Ability:  (check writing task)  Therapy/Group: Individual Therapy  [name] [name] 06/14/2011, 3:11 PM

## 2011-06-14 NOTE — Plan of Care (Signed)
Problem: RH BOWEL ELIMINATION Goal: RH STG MANAGE BOWEL W/MEDICATION W/ASSISTANCE STG Manage Bowel with Medication with min. Assistance.  Outcome: Progressing Loose stools, not requiring stool softener or laxative

## 2011-06-14 NOTE — Progress Notes (Signed)
Upon going down the hall to check on another patient, noted pt sitting in the floor in front of his wheelchair. Called to the desk to get assistance in the room, While waiting assessed pt in the floor. Pt denies hitting head, denies any areas of injury, noted no injury on assessment. Pt stated" I slid from my chair, I am fine, declined to have his daughter called, stated" no...she doesn't need to know" Staff members Angie and Physicist, medical in room, this Clinical research associate went to get dillgent equipment and sling to transfer pt back to bed, while out of room per report of nurse Angie pt transferred self back to bed. Marissa Nestle, PA paged and made aware of pt being found in front of wheelchair. No new orders received. VS stable, see [facility] for details, daughter Enrique Sack notified of fall via phone and interventions put in place, per conversation on way back to room now. Bed alarm on, side rails up x 3, added the quick release to be on when patient up in chair or recliner to safety plan. Pt stated " I don't need that stuff, I am fine. " Report given to [facility] RN, aware of plan of care, will continue to monitor. Roberts-VonCannon, Mollyann Halbert Elon Jester

## 2011-06-14 NOTE — Plan of Care (Signed)
Problem: RH BOWEL ELIMINATION Goal: RH STG MANAGE BOWEL W/EQUIPMENT W/ASSISTANCE STG Manage Bowel With Equipment With min. Assistance  Outcome: Progressing Wheelchair to bathroom then transfer to commode

## 2011-06-14 NOTE — Plan of Care (Signed)
Problem: RH BOWEL ELIMINATION Goal: RH STG MANAGE BOWEL WITH ASSISTANCE STG Manage Bowel with min. Assistance.  Outcome: Not Progressing Pt with loose stools, awaiting C-diff results, incontinent x 1 today

## 2011-06-14 NOTE — Plan of Care (Signed)
Problem: RH SAFETY Goal: RH STG DEMO UNDERSTANDING HOME SAFETY PRECAUTIONS Min.  Outcome: Not Progressing No family present to provide education to.

## 2011-06-15 DIAGNOSIS — Z5189 Encounter for other specified aftercare: Secondary | ICD-10-CM

## 2011-06-15 DIAGNOSIS — I6330 Cerebral infarction due to thrombosis of unspecified cerebral artery: Secondary | ICD-10-CM

## 2011-06-15 LAB — BASIC METABOLIC PANEL
BUN: 9 mg/dL (ref 6–23)
Chloride: 102 mEq/L (ref 96–112)
Creatinine, Ser: 0.66 mg/dL (ref 0.50–1.35)
Glucose, Bld: 95 mg/dL (ref 70–99)
Potassium: 3.4 mEq/L — ABNORMAL LOW (ref 3.5–5.1)

## 2011-06-15 LAB — PROTIME-INR: Prothrombin Time: 29.7 seconds — ABNORMAL HIGH (ref 11.6–15.2)

## 2011-06-15 NOTE — Progress Notes (Signed)
ANTICOAGULATION CONSULT NOTE - Follow Up Consult  Pharmacy Consult:  Coumadin Indication: atrial fibrillation  Vital Signs: Temp: 99 F (37.2 C) (03/26 0528) Temp src: Oral (03/26 0528) BP: 111/70 mmHg (03/26 0528) Pulse Rate: 93  (03/26 0528)  Labs:  Basename 06/15/11 0553 06/14/11 1050 06/13/11 0650  HGB -- -- --  HCT -- -- --  PLT -- -- --  APTT -- -- --  LABPROT 29.7* 28.5* 30.4*  INR 2.77* 2.63* 2.85*  HEPARINUNFRC -- -- --  CREATININE -- -- --  CKTOTAL -- -- --  CKMB -- -- --  TROPONINI -- -- --   Estimated Creatinine Clearance: 94.3 ml/min (by C-G formula based on Cr of 0.65).    Assessment: 71 yom s/p stroke w/ atrial fibrillation requiring initiation of Coumadin for further stroke prevention. INR today remains therapeutic on dose of 3mg  daily. No bleeding noted. No CBC available.   Goal of Therapy:  INR 2-3   Plan:  - Continue Coumadin 3mg  PO daily - Change INR to MWF - Consider checking a dilantin level to assess therapy    Phillips Climes, PharmD, BCPS Pager:  [phone] 06/15/2011, 10:19 AM

## 2011-06-15 NOTE — Progress Notes (Signed)
Physical Therapy Weekly Progress Note  Patient Details  Name: [name] MRN: [id number] Date of Birth: 1940/04/07  Today's Date: 06/15/2011 Time: 1100-1200 Time Calculation (min): 60 min  Patient is making steady progress towards PT LTG and has met 3 of 4 short term goals.  Patient did not meet supervision bed mobility goal; patient continues to require min A for safe bed mobility on flat bed without use of rail and requires total verbal cues for safe and effective sequence.  Patient is currently supervision w/c mobility in controlled environment, min-mod A overall for bed mobility, bed <> chair transfers, gait with RW and stair negotiation.    Patient continues to demonstrate the following deficits: impaired memory and safety awareness, impulsivity, sequencing impairments and apraxia, R hemiplegia, impaired dynamic standing balance and gait and therefore will continue to benefit from skilled PT intervention to enhance overall performance with balance, postural control, ability to compensate for deficits, functional use of  right lower extremity, awareness and coordination. Patient's LTG set for supervision overall secondary to cognitive impairments but patient lives alone.  Have discussed the needs for supervision and assistance at home but patient reports he has not decided if he will have someone come live with him or not.  Team is aware of situation and will continue to address.  Patient progressing toward long term goals..  Continue plan of care.  PT Short Term Goals Week 1:  PT Short Term Goal 1 (Week 1): Pt will be able to complete bed mobility with supervision (with adaptive equipment if needed) PT Short Term Goal 1 - Progress (Week 1): Progressing toward goal PT Short Term Goal 2 (Week 1): Pt will be able to maintain dynamic standing balance during functional task with min A PT Short Term Goal 2 - Progress (Week 1): Met PT Short Term Goal 3 (Week 1): Pt will be able to  demonstrate functional transfers with min A PT Short Term Goal 3 - Progress (Week 1): Met PT Short Term Goal 4 (Week 1): Pt will be able to gait x 50' with min A PT Short Term Goal 4 - Progress (Week 1): Met Week 2:  PT Short Term Goal 1 (Week 2): = LTG: supervision overall  Therapy Documentation Precautions:  Precautions Precautions: Fall Precaution Comments: Dys. 3 textures and thin liquids; significant RLE weakness Required Braces or Orthoses: No Restrictions Weight Bearing Restrictions: No Pain: Pain Assessment Pain Assessment: No/denies pain Locomotion : Ambulation Ambulation: Yes Ambulation/Gait Assistance: 3: Mod assist Ambulation Distance (Feet): 150 Feet Assistive device: Rolling walker Ambulation/Gait Assistance Details: Tactile cues for posture;Visual cues for safe use of DME/AE;Visual cues/gestures for sequencing;Verbal cues for sequencing;Verbal cues for gait pattern;Verbal cues for safe use of DME/AE;Manual facilitation for weight shifting Ambulation/Gait Assistance Details (indicate cue type and reason): Pre-gait training with use of stairs with multiple RLE foot taps to 4" step to focus on active hip and knee flexion and heel strike; RLE step ups on 4" step with mod A for full anterior translation of COG over R stance LE and full hip and knee extension in stance; gait training in controlled environment with RW and cues to maintain safe distance to RW, full R and L step and stride length, R foot clearance and heel strike and full anterior translation of COG over R stance LE.   Stairs / Additional Locomotion Stairs: Yes Stairs Assistance: 3: Mod assist Stairs Assistance Details: Tactile cues for sequencing;Tactile cues for placement;Visual cues/gestures for precautions/safety;Visual cues/gestures for sequencing;Verbal cues for  sequencing;Verbal cues for precautions/safety Stairs Assistance Details (indicate cue type and reason): Gave patient verbal and visual demonstration  first; performed stairs leading with LLE to ascend and RLE to descend with mod A for RLE full advancement to next step and stability of RLE in stance with cues for safe sequence.   Stair Management Technique: Two rails;Step to pattern;Forwards Number of Stairs: 5  Height of Stairs: 6  Corporate treasurer: Yes Wheelchair Assistance: 5: Investment banker, operational: Both upper extremities Wheelchair Parts Management: Needs assistance Distance: 150  Other Treatments: Treatments Therapeutic Activity: Patient noted to be attempting to stand from w/c without translation of COG over BOS; multiple sit to stands from mat with focus on thoracic extension during full anterior lean to translate COG over BOS and then performing full trunk, hip and knee extension to prevent posterior LOB; performed 10x without UE support with controlled lowering to sit; performed isometric squat with LLE toe taps to L and then back to midline to focus on R knee control and R lateral weight shifts.    See FIM for current functional status  Therapy/Group: Individual Therapy  Edman Circle [facility] 06/15/2011, 12:57 PM

## 2011-06-15 NOTE — Progress Notes (Signed)
Patient ID: [name], male   DOB: 12/18/1940, 71 y.o.   MRN: [id number]  Subjective/Complaints: No diarrhea for several days.   Stomach still "rumbles"   Objective: Vital Signs: Blood pressure 111/70, pulse 93, temperature 99 F (37.2 C), temperature source Oral, resp. rate 18, height 6\' 1"  (1.854 m), weight 86.093 kg (189 lb 12.8 oz), SpO2 95.00%. No results found. Results for orders placed during the hospital encounter of 06/07/11 (from the past 72 hour(s))  PROTIME-INR     Status: Abnormal   Collection Time   06/13/11  6:50 AM      Component Value Range Comment   Prothrombin Time 30.4 (*) 11.6 - 15.2 (seconds)    INR 2.85 (*) 0.00 - 1.49    PROTIME-INR     Status: Abnormal   Collection Time   06/14/11 10:50 AM      Component Value Range Comment   Prothrombin Time 28.5 (*) 11.6 - 15.2 (seconds)    INR 2.63 (*) 0.00 - 1.49    PROTIME-INR     Status: Abnormal   Collection Time   06/15/11  5:53 AM      Component Value Range Comment   Prothrombin Time 29.7 (*) 11.6 - 15.2 (seconds)    INR 2.77 (*) 0.00 - 1.49      Physical Exam  Nursing note and vitals reviewed.  Constitutional: He appears well-developed.  HENT: PERRL, EOMI  Head: Normocephalic.  Neck: Normal range of motion. No thyromegaly present.  Cardiovascular: Normal rate.  Pulmonary/Chest: Breath sounds normal. He has no wheezes.  Abdominal: He exhibits no distension. There is no tenderness.  Musculoskeletal: He exhibits no edema.  RLE with 2+ edema at ankle Neurological: He is alert.  Pt alert. Follows simple commnds, needing cueing at times. Right facial droop and tongue deviation. Speech dysarthric but Intelligible. He has mild verbal and motor apraxia.. Patient is oriented x3 with good recall. Diminished pain sense on the right side.Light touch intact RUE 4/5. RLE 0-1/5. Mild right pronator drift and decreased FMC on right. DTR's 3+ on right. Toes up.  Skin: Skin is warm and dry     Assessment/Plan: 1.  Functional deficits secondary to L ACA infarct with RLE paresis which require 3+ hours per day of interdisciplinary therapy in a comprehensive inpatient rehab setting. Physiatrist is providing close team supervision and 24 hour management of active medical problems listed below. Physiatrist and rehab team continue to assess barriers to discharge/monitor patient progress toward functional and medical goals. FIM: FIM - Bathing Bathing Steps Patient Completed: Chest;Right Arm;Left Arm;Abdomen;Front perineal area;Buttocks;Right upper leg;Left upper leg;Left lower leg (including foot);Right lower leg (including foot) Bathing: 5: Supervision: Safety issues/verbal cues  FIM - Upper Body Dressing/Undressing Upper body dressing/undressing steps patient completed: Thread/unthread left sleeve of pullover shirt/dress;Thread/unthread right sleeve of pullover shirt/dresss;Put head through opening of pull over shirt/dress Upper body dressing/undressing: 5: Set-up assist to: Obtain clothing/put away FIM - Lower Body Dressing/Undressing Lower body dressing/undressing steps patient completed: Thread/unthread right pants leg;Thread/unthread left pants leg;Pull pants up/down;Don/Doff left sock;Don/Doff left shoe Lower body dressing/undressing: 3: Mod-Patient completed 50-74% of tasks  FIM - Toileting Toileting steps completed by patient: Performs perineal hygiene Toileting Assistive Devices: Grab bar or rail for support Toileting: 2: Max-Patient completed 1 of 3 steps  FIM - Diplomatic Services operational officer Devices: Grab bars Toilet Transfers: 3-To toilet/BSC: Mod A (lift or lower assist)  FIM - Banker Devices: HOB elevated Bed/Chair Transfer: 4: Bed >  Chair or W/C: Min A (steadying Pt. > 75%)  FIM - Locomotion: Wheelchair Distance: 150 Locomotion: Wheelchair: 5: Travels 150 ft or more: maneuvers on rugs and over door sills with supervision, cueing or  coaxing FIM - Locomotion: Ambulation Locomotion: Ambulation Assistive Devices: Designer, industrial/product Ambulation/Gait Assistance: 4: Min assist Locomotion: Ambulation: 2: Travels 50 - 149 ft with minimal assistance (Pt.>75%)  Comprehension Comprehension Mode: Auditory Comprehension: 4-Understands basic 75 - 89% of the time/requires cueing 10 - 24% of the time  Expression Expression Mode: Verbal Expression: 4-Expresses basic 75 - 89% of the time/requires cueing 10 - 24% of the time. Needs helper to occlude trach/needs to repeat words.  Social Interaction Social Interaction: 4-Interacts appropriately 75 - 89% of the time - Needs redirection for appropriate language or to initiate interaction.  Problem Solving Problem Solving: 3-Solves basic 50 - 74% of the time/requires cueing 25 - 49% of the time  Memory Memory: 2-Recognizes or recalls 25 - 49% of the time/requires cueing 51 - 75% of the time  Medical Problem List and Plan:  1. 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 DVT Prophylaxis/Anticoagulation: Chronic Coumadin therapy. Monitor for any signs of bleeding  3. Recurrent seizure. Continue Dilantin and Keppra therapy as per neurology services. Will need followup Dilantin level in next couple days. Monitor for any further seizure activity.  4. Dysphagia. Followup per speech therapy.Pt advanced to D3 diet. Monitor for any signs of aspiration  5. Atrial flutter with RVR. Continue Cardizem and follow up cardiology services. HR controlled at present.  6.  Loose stools improving off senna but ongoing issues  Check c diff, 1 loose stool on 3/21  7.  Hypokalemia plus pedal edema reduce KCL and start aldactone  LOS (Days) 8 A FACE TO FACE EVALUATION WAS PERFORMED  [name] E 06/15/2011, 6:57 AM    Review of Systems  Cardiovascular: Negative for chest pain.  Gastrointestinal: Negative for nausea,  vomiting, abdominal pain and diarrhea.  Neurological: Positive for focal weakness.  All other systems reviewed and are negative.

## 2011-06-15 NOTE — Progress Notes (Signed)
Speech Language Pathology Daily Session Note  Patient Details  Name: [name] MRN: [id number] Date of Birth: Jan 05, 1941  Today's Date: 06/15/2011 Time: 7829-5621 Time Calculation (min): 40 min  Short Term Goals: Week 1: SLP Short Term Goal 1 (Week 1): Patient will consume Dys.3 textures and thin liquids with supervision assist for carryover of compensatory strategies SLP Short Term Goal 2 (Week 1): Patient will request help as needed with BADLs with moderate assist semantic cues SLP Short Term Goal 3 (Week 1): Patient will utilize description during moments of word finding with basic information with mod I  SLP Short Term Goal 4 (Week 1): Patient will follow 2 step directions with moderate assist semantic cues  Skilled Therapeutic Interventions:  Treatment focused on safe swallow precautions and safety with po's during breakfast meal as well as facilitation of language.  He required mod-max verbal reminders for second swallow.  Pt. Expressed mild frustration with ability to remember strategy therefore SLP provided support/understanding and clinical rationale.   Mild s/s aspiration with delayed cough x 2 and throat clear indicative of probable buildup of residue.  He exhibited only one instance of word finding difficulty, however displayed moderate difficulty comprehending higher level concepts/phrasing and aspects of conversation regarding medical reason for stroke and required further explanation by SLP using various language to convey message.  SLP provided positive feedback for pt. Asking questions to further clarify information he does not comprehend.  Daily Session Precautions/Restrictions    FIM:  FIM - Eating Eating Activity: 5: Needs verbal cues/supervision General    Pain Pain Assessment Pain Assessment: No/denies pain Pain Score: 0-No pain Cognition: Orientation Level: Oriented to person;Oriented to place Oral/Motor: Oral Motor/Sensory Function Overall Oral  Motor/Sensory Function: Impaired Labial ROM: Reduced right Labial Symmetry: Abnormal symmetry right Labial Strength: Reduced Lingual ROM: Reduced right Lingual Strength: Reduced Facial ROM: Reduced right Motor Speech Overall Motor Speech: Appears within functional limits for tasks assessed (basic expression ) Respiration: Within functional limits Resonance: Within functional limits Articulation: Within functional limitis Intelligibility: Intelligible Motor Planning: Not tested Interfering Components: Hearing loss (sister reports HOH) Comprehension: Auditory Comprehension Overall Auditory Comprehension: Impaired Yes/No Questions: Impaired Complex Questions: 75-100% accurate (80%) Commands: Impaired Two Step Basic Commands: 50-74% accurate Interfering Components: Attention;Processing speed;Working Radio broadcast assistant: Extra processing time;Visual/Gestural cues;Other (Comment) (written aids) Visual Recognition/Discrimination Discrimination: Not tested Reading Comprehension Reading Status: Not tested Expression: Expression Primary Mode of Expression: Verbal Verbal Expression Overall Verbal Expression: Impaired Level of Generative/Spontaneous Verbalization: Phrase;Sentence Repetition: No impairment Verbal Errors: Not aware of errors (word finding and circumlocution during verbal expression ) Pragmatics: Impairment Impairments: Interpretation of nonverbal communication;Topic maintenance Interfering Components: Premorbid deficit Written Expression Dominant Hand: Right Written Expression: Within Functional Limits Dictation Ability:  (check writing task)  Therapy/Group: Individual Therapy  Royce Macadamia 06/15/2011, 8:49 AM

## 2011-06-15 NOTE — Progress Notes (Signed)
Occupational Therapy Weekly Progress Note  Patient Details  Name: [name] MRN: [id number] Date of Birth: 22-Oct-1940  Today's Date: 06/15/2011 Time: 4098-1191 Time Calculation (min): 55 min  Patient has met 1 of 4 short term goals.  Pt with decreased safety awareness and impulsivity, requires increased cues for safety with transfers and sit to stand to complete LB bathing and dressing.  Pt will benefit from more activities focusing on standing balance and engaging RLE in standing as pt prefers to leave RLE in front when going from sit to stand and not weightbear through it.    Patient continues to demonstrate the following deficits: safety awareness, impulsivity, decreased STM, and decreased balance and therefore will continue to benefit from skilled OT intervention to enhance overall performance with BADL and iADL.  Patient progressing toward long term goals..  Continue plan of care.  OT Short Term Goals Week 1:  OT Short Term Goal 1 (Week 1): Pt will complete bathing with min assist at sit to stand level in walk-in shower OT Short Term Goal 1 - Progress (Week 1): Met OT Short Term Goal 2 (Week 1): Pt will complete LB dressing with min assist at sit to stand level OT Short Term Goal 2 - Progress (Week 1): Progressing toward goal OT Short Term Goal 3 (Week 1): Pt will complete toilet transfer with min assist stand pivot OT Short Term Goal 3 - Progress (Week 1): Progressing toward goal OT Short Term Goal 4 (Week 1): Pt will complete grooming in standing with min assist OT Short Term Goal 4 - Progress (Week 1): Progressing toward goal Week 2:  OT Short Term Goal 1 (Week 2): Pt will complete LB dressing with min assist at sit to stand level OT Short Term Goal 2 (Week 2): Pt will complete toilet transfer with min assist stand pivot OT Short Term Goal 3 (Week 2): Pt will complete grooming in standing at sink with min assist.  Skilled Therapeutic Interventions/Progress Updates:    Pt  seen for ADL retraining with focus on safety with stand pivot transfers and sit <> stand with bathing and dressing.  Pt with short term memory deficits, continues to require cues for safety with transfers. Pt requested that this therapist leave while he completed bathing; continues to require education on the need for staff with mobility secondary to Rt leg weakness, increased risk of falling, and safety awareness and insight. Pt completed 50% bathing in standing with steady assist at hips to maintain upright standing balance.  Pt with increased stability with cues for RLE placement and weight shifting to engage RLE.     Therapy Documentation Precautions:  Precautions Precautions: Fall Precaution Comments: Dys. 3 textures and thin liquids; significant RLE weakness Required Braces or Orthoses: No Restrictions Weight Bearing Restrictions: No Pain: Pain Assessment Pain Assessment: No/denies pain Pain Score: 0-No pain ADL: ADL Eating: Set up Grooming: Supervision/safety Where Assessed-Grooming: Sitting at sink Upper Body Bathing: Supervision/safety Where Assessed-Upper Body Bathing: Shower Lower Body Bathing: Minimal assistance;Minimal cueing Where Assessed-Lower Body Bathing: Shower Upper Body Dressing: Setup Where Assessed-Upper Body Dressing: Chair Lower Body Dressing: Moderate assistance Where Assessed-Lower Body Dressing: Chair Toileting: Minimal assistance Where Assessed-Toileting: Toilet;Bedside Commode (BSC over toilet) Toilet Transfer: Moderate assistance Toilet Transfer Method: Stand pivot Toilet Transfer Equipment: Bedside commode;Grab bars Film/video editor: Minimal assistance;Moderate cueing Film/video editor Method: Administrator: Transfer tub bench ADL Comments: Pt requires increased cues for safety secondary to impulsiveness and decreased STM.  Pt  requires verbal and tactile cues for RLE placement prior to sit to stand.  See FIM for  current functional status  Therapy/Group: Individual Therapy  [name] 06/15/2011, 11:49 AM

## 2011-06-15 NOTE — Progress Notes (Signed)
Nutrition Follow-up  Eating very well, 50-75% of meals, per pt. Pt experienced intermittent diarrhea recently, however per most recent MD note, pt without diarrhea for several days; stomach "rumbles."  Pt denied any snacks or supplements at this time. Pt feels as though his appetite is slowly improving each day.  Diet Order:  Dysphagia 3 with thin liquids  Meds: Scheduled Meds:   . diltiazem  120 mg Oral Daily  . levETIRAcetam  1,750 mg Oral BID  . phenytoin  134 mg Oral Q8H  . potassium chloride  10 mEq Oral BID  . spironolactone  25 mg Oral Daily  . warfarin  3 mg Oral q1800  . Warfarin - Pharmacist Dosing Inpatient   Does not apply q1800   Continuous Infusions:  PRN Meds:.acetaminophen, alum & mag hydroxide-simeth, ondansetron (ZOFRAN) IV, ondansetron, polyethylene glycol, sorbitol  Labs:  CMP     Component Value Date/Time   NA 144 06/12/2011 0500   K 3.4* 06/12/2011 0500   CL 109 06/12/2011 0500   CO2 28 06/12/2011 0500   GLUCOSE 89 06/12/2011 0500   BUN 11 06/12/2011 0500   CREATININE 0.65 06/12/2011 0500   CALCIUM 8.4 06/12/2011 0500   PROT 5.5* 06/08/2011 0650   ALBUMIN 2.7* 06/08/2011 0650   AST 47* 06/08/2011 0650   ALT 46 06/08/2011 0650   ALKPHOS 69 06/08/2011 0650   BILITOT 0.4 06/08/2011 0650   GFRNONAA >90 06/12/2011 0500   GFRAA >90 06/12/2011 0500     Intake/Output Summary (Last 24 hours) at 06/15/11 0958 Last data filed at 06/15/11 0900  Gross per 24 hour  Intake    600 ml  Output      0 ml  Net    600 ml    Weight Status:  86.1 kg, wt up 6.8 kg x 1 week  Estimated needs:  1700 - 1800 kcal, 75 - 85 grams protein  Nutrition Dx:  Inadequate oral intake r/t variable appetite AEB pt report. Resolved.  Goal:  Pt to consume >/= 75% of meals. Met.  Intervention:  Continue current interventions.  Monitor:  PO intake, weights, labs, I/O's  Adair Laundry Pager #:  [phone]

## 2011-06-15 NOTE — Progress Notes (Signed)
Occupational Therapy Session Note  Patient Details  Name: JAHLON BAINES MRN: [id number] Date of Birth: [birth year]  Today's Date: 06/15/2011 Time: 3086-5784 Time Calculation (min): 40 min  Short Term Goals: Week 2:  OT Short Term Goal 1 (Week 2): Pt will complete LB dressing with min assist at sit to stand level OT Short Term Goal 2 (Week 2): Pt will complete toilet transfer with min assist stand pivot OT Short Term Goal 3 (Week 2): Pt will complete grooming in standing at sink with min assist.  Skilled Therapeutic Interventions/Progress Updates:  Upon arrival patient in bathroom standing over urine on floor while reaching outside base of support to retrieve towel off door and daughter standing outside partially cracked open bathroom door telling patient he is not suppose to be up.  Prior to my arrival, patient had been told not to stand or get up without assistance.  Daughter sat in corner working on a laptop for our session.  She did not ask questions or engage in session except one time and again when she was asked at the end of the session if she would be providing assistance when her father discharges.  She stated that she would.  Encouraged her to engage in therapy sessions and reintegrated some of my safety concerns and she verbilized understanding.  Self care retraining to include toilet transfer, toileting, LB undressing and dressing, and neuro-muscular reeducation of RLE during BADL tasks.  Focus session on safety due to impulsivity and poor awareness of his deficits, adaptive dressing techniques, sit><stand numerous times and standing balance with focus on placement of right foot prior to stand, heavy weight bearing on RLE, postural control, slow controlled movements, and weight shifts onto RLE.    Precautions:  Precautions Precautions: Fall, side rails up X3 and QRB when OOB Precaution Comments: Dys. 3 textures and thin liquids; significant RLE weakness Required Braces or  Orthoses: No Restrictions Weight Bearing Restrictions: No Pain: Pain Assessment Pain Assessment: No/denies pain  See FIM for current functional status  Therapy/Group: Individual Therapy  Maycie Luera 06/15/2011, 3:44 PM

## 2011-06-15 NOTE — Progress Notes (Signed)
Patient has no complaints this shift thus far. Patient likes to be independent and needs frequent checks if no one in the room and QRB while in the wheelchair. Also frequent reminders for patient to use call bell. Neuro assessments done per protocol post fall. No unsafe behaviors noted this shift thus far. Family at the bedside. Tolerating therapies well. Continue to monitor.

## 2011-06-16 LAB — BASIC METABOLIC PANEL
BUN: 9 mg/dL (ref 6–23)
CO2: 26 mEq/L (ref 19–32)
Glucose, Bld: 113 mg/dL — ABNORMAL HIGH (ref 70–99)
Potassium: 3.4 mEq/L — ABNORMAL LOW (ref 3.5–5.1)
Sodium: 140 mEq/L (ref 135–145)

## 2011-06-16 MED ORDER — WARFARIN SODIUM 1 MG PO TABS
1.5000 mg | ORAL_TABLET | Freq: Once | ORAL | Status: AC
  Administered 2011-06-16: 1.5 mg via ORAL
  Filled 2011-06-16: qty 1

## 2011-06-16 NOTE — Progress Notes (Signed)
Speech Language Pathology Daily Session Note  Patient Details  Name: [name] MRN: [id number] Date of Birth: [birth year]  Today's Date: 06/16/2011 Time: 4098-1191 Time Calculation (min): 60 min  Short Term Goals: Week 1: SLP Short Term Goal 1 (Week 1): Patient will consume Dys.3 textures and thin liquids with supervision assist for carryover of compensatory strategies SLP Short Term Goal 2 (Week 1): Patient will request help as needed with BADLs with moderate assist semantic cues SLP Short Term Goal 3 (Week 1): Patient will utilize description during moments of word finding with basic information with mod I  SLP Short Term Goal 4 (Week 1): Patient will follow 2 step directions with moderate assist semantic cues  Skilled Therapeutic Interventions: Session focused on skilled treatment of swallow function and expression/understanidng of basic wants and needs.  SLP facilitated session by verbally and visually reinforcing use of 2 swallow per bite/sip with moderate cues faded to minimal cues.  SLP also facilitated session by providing written and verbal information, as well as rephrasing choices to order lunch and dinner items.  Patient required minimal assist semantic and visual cues to sequence 4 pictures and verbally explain what is happening in each picture with more specific language.  After cues to verbally explain task, patient was able to self correct sequencing in 4/5 opportunities.   Daily Session Precautions/Restrictions  Restrictions Weight Bearing Restrictions: No FIM:  Comprehension Comprehension Mode: Auditory Comprehension: 3-Understands basic 50 - 74% of the time/requires cueing 25 - 50%  of the time Expression Expression Mode: Verbal Expression: 4-Expresses basic 75 - 89% of the time/requires cueing 10 - 24% of the time. Needs helper to occlude trach/needs to repeat words. Social Interaction Social Interaction: 4-Interacts appropriately 75 - 89% of the time - Needs  redirection for appropriate language or to initiate interaction. Problem Solving Problem Solving: 3-Solves basic 50 - 74% of the time/requires cueing 25 - 49% of the time Memory Memory: 2-Recognizes or recalls 25 - 49% of the time/requires cueing 51 - 75% of the time General    Pain Pain Assessment Pain Assessment: No/denies pain Pain Score: 0-No pain  Therapy/Group: Individual Therapy  Charlane Ferretti., CCC-SLP [phone]  Lesette Frary 06/16/2011, 9:57 AM

## 2011-06-16 NOTE — Patient Care Conference (Signed)
Inpatient RehabilitationTeam Conference Note Date: 06/16/2011   Time: 11:05 AM    Patient Name: [name] [facility]      Medical Record Number: [id number]  Date of Birth: 31-Jul-1940 Sex: Male         Room/Bed: 4028/4028-02 Payor Info: Payor: MEDICARE  Plan: MEDICARE PART A AND B  Product Type: *No Product type*     Admitting Diagnosis: LT Frontal CVA  Admit Date/Time:  06/07/2011  4:45 PM Admission Comments: No comment available   Primary Diagnosis:  CVA (cerebral infarction) Principal Problem: CVA (cerebral infarction)  Patient Active Problem List  Diagnoses Date Noted  . CVA (cerebral infarction) 06/08/2011  . Atrial flutter 06/04/2011  . Status epilepticus 06/02/2011  . 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 SINUSITIS, CHRONIC NEC 12/14/2006  . ANEMIA, DEFICIENCY NOS 10/11/2006  . ABUSE, ALCOHOL, CONTINUOUS 10/11/2006  . 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 ABNORMAL RESULT, FUNCTION STUDY, LIVER 10/11/2006  . CEREBROVASCULAR ACCIDENT, HX OF 10/11/2006  . DEMENTIA 09/15/2006  . HYPERTENSION 09/15/2006  . CVA 09/15/2006  . ALLERGIC RHINITIS 09/15/2006  . CIRRHOSIS, ALCOHOLIC, LIVER 09/15/2006  . SEIZURE DISORDER 09/15/2006  . PANCREATITIS, HX OF 09/15/2006    Expected Discharge Date: Expected Discharge Date: 06/29/11  Team Members Present: Physician: Dr. Claudette Laws Case Manager Present: Lutricia Horsfall, RN Social Worker Present: Dossie Der, LCSW Nurse Present: Gregor Hams, RN PT Present: Wanda Plump, Varney Biles, PT OT Present: [name], Felipa Eth, OT SLP Present: Fae Pippin, SLP PPS Coordinator: Tora Duck, RN    Current Status/Progress Goal Weekly Team Focus  Medical   Electrolyte balance has improved, occasional loose stool overall improved, still has peripheral edema  Maintain medical stability  Adjust diuretic medications monitor potassium level   Bowel/Bladder   continent of Bowel and Bladder, loosee  stool on  06-14-11, awaiting C-Diff culture results due back on 06-17-11 per infection prevention; loose bowels x2 06-15-11  continent with toileting  Up to BR   Swallow/Nutrition/ Hydration   Dys.3 thin liquids no straws, intermittent staff supervision, medication whole in puree  least restrictive p.o. intake   increase patient self monitoring and carryover of double swallows   ADL's   min assist bathing, mod assist LB dsg, min-mod assist stand pivot transfers  supervision overall  safety education, transfers, standing tolerance/weight shifting to improve RLE engagement   Mobility   min-mod A overall   mod I w/c mobility, supervision basic transfers and ambulation with RW, min A stairs  safety, gait and balance   Communication   Mod-min assist receptive deficits greater than expressive  Min assist  increase awareness of deficits   Safety/Cognition/ Behavioral Observations  mod-max assist  min assist to supervision assist with basic information  increase awareness   Pain   no c/o pain  3 or less on scale of 1-10      Skin   intact  no new breakdown         *See Interdisciplinary Assessment and Plan and progress notes for long and short-term goals  Barriers to Discharge: Slow neurologic recovery of the right lower extremity    Possible Resolutions to Barriers:  Assess post discharge care giver abilities    Discharge Planning/Teaching Needs:    Pt's daughter will provide 24/7 supervision.     Team Discussion: Pt's receptive aphasia > expressive. Requires mod assist to order meal. Pt apraxic, impulsive, poor sequencing. Poor insight. Wants to live independently. Plan is for pt's daughter to live with him.  Revisions to Treatment Plan:  none   Continued Need for Acute Rehabilitation Level of Care: The patient requires daily medical management by a physician with specialized training in physical medicine and rehabilitation for the following conditions: Daily direction of a multidisciplinary  physical rehabilitation program to ensure safe treatment while eliciting the highest outcome that is of practical value to the patient.: Yes Daily medical management of patient stability for increased activity during participation in an intensive rehabilitation regime.: Yes Daily analysis of laboratory values and/or radiology reports with any subsequent need for medication adjustment of medical intervention for : Neurological problems;Other  [name] 06/16/2011, 3:39 PM

## 2011-06-16 NOTE — Progress Notes (Signed)
ANTICOAGULATION CONSULT NOTE - Follow Up Consult  Pharmacy Consult:  Coumadin Indication: atrial fibrillation  Vital Signs: Temp: 98.1 F (36.7 C) (03/27 0500) Temp src: Oral (03/27 0500) BP: 123/70 mmHg (03/27 0500) Pulse Rate: 93  (03/27 0500)  Labs:  [name] 06/16/11 0835 06/16/11 0455 06/15/11 0931 06/15/11 0553 06/14/11 1050  HGB -- -- -- -- --  HCT -- -- -- -- --  PLT -- -- -- -- --  APTT -- -- -- -- --  LABPROT -- 32.8* -- 29.7* 28.5*  INR -- 3.15* -- 2.77* 2.63*  HEPARINUNFRC -- -- -- -- --  CREATININE 0.65 -- 0.66 -- --  CKTOTAL -- -- -- -- --  CKMB -- -- -- -- --  TROPONINI -- -- -- -- --   Estimated Creatinine Clearance: 94.3 ml/min (by C-G formula based on Cr of 0.65).    Assessment: 71 yom s/p stroke w/ atrial fibrillation requiring initiation of Coumadin for further stroke prevention. INR slightly elevated today on Coumadin 3mg  PO daily.  No bleeding noted.  Goal of Therapy:  INR 2-3    Plan:  - Coumadin 1.5mg  PO today - Change INR back to daily for now - Consider checking a dilantin level to assess therapy    Phillips Climes, PharmD, BCPS Pager:  [phone] 06/16/2011, 10:02 AM

## 2011-06-16 NOTE — Progress Notes (Signed)
Physical Therapy Session Note  Patient Details  Name: [name] MRN: [id number] Date of Birth: August 22, 1940  Today's Date: 06/16/2011 Time: 1500-1600 Time Calculation (min): 60 min  Short Term Goals: Week 2:  PT Short Term Goal 1 (Week 2): = LTG: supervision overall  Therapy Documentation Precautions:  Precautions Precautions: Fall Precaution Comments: Dys. 3 textures and thin liquids; significant RLE weakness Required Braces or Orthoses: No Restrictions Weight Bearing Restrictions: No Pain: Pain Assessment Pain Assessment: No/denies pain Locomotion : Ambulation Ambulation: Yes Ambulation/Gait Assistance: 3: Mod assist Ambulation Distance (Feet): 150 Feet Assistive device: Rolling walker;Other (Comment) (treadmill) Ambulation/Gait Assistance Details: Manual facilitation for weight shifting;Manual facilitation for placement;Tactile cues for sequencing;Tactile cues for posture;Visual cues/gestures for sequencing;Verbal cues for sequencing;Verbal cues for gait pattern Ambulation/Gait Assistance Details (indicate cue type and reason): Gait training on unit and on treadmill at 0.5 mph x 5 min + 5 min with rest break in between with cues for full hip extension, full step and stride length bilaterally, R foot clearance, and anterior translation of COG over R stance LE; also performed R and L sidestepping with RW and min-mod A for improved lateral weight shifting, safety and sequencing  Gait Gait: Yes Gait velocity: Ambulated with RW 2.18 ft/sec with min-mod A for safety; speed consistent with neighborhood ambulator but patient still presents with multiple safety and gait impairments limiting safe household and neighborhood ambulation    See FIM for current functional status  Therapy/Group: Individual Therapy  Edman Circle [facility] 06/16/2011, 4:57 PM

## 2011-06-16 NOTE — Plan of Care (Signed)
Problem: RH SAFETY Goal: RH STG DEMO UNDERSTANDING HOME SAFETY PRECAUTIONS Min.  Outcome: Not Progressing Patient does not understand that going home alone would be unsafe.

## 2011-06-16 NOTE — Progress Notes (Signed)
Occupational Therapy Session Note  Patient Details  Name: [name] MRN: [id number] Date of Birth: 08/28/1940  Today's Date: 06/16/2011 Time: 4098-1191 and 1330-1400 Time Calculation (min): 55 min and 30 min  Short Term Goals: Week 2:  OT Short Term Goal 1 (Week 2): Pt will complete LB dressing with min assist at sit to stand level OT Short Term Goal 2 (Week 2): Pt will complete toilet transfer with min assist stand pivot OT Short Term Goal 3 (Week 2): Pt will complete grooming in standing at sink with min assist.  Skilled Therapeutic Interventions/Progress Updates:   1) Pt seen for ADL retraining with focus on safety with stand pivot transfers, sit <> stand, and ambulation from toilet to tub bench in walk-in shower (approx 5 feet) with HHA.  Pt continues to require cues for safety with transfers and with functional mobility secondary to continuous requests for therapist to leave while pt bathing.  Educated pt on safety precautions and his tendency to lose his balance in standing and with transfers and encouragement to weight bear through RLE when in standing to increase stability.  Pt blamed therapist for his "wobbliness" in standing.  Pt's safety awareness, sequencing, and impulsivity continue to be impaired.  2) Engaged in therapeutic exercise with focus on functional mobility with RW, safety with ambulation, sit <> stand with engaging RLE for weight bearing and weight shifting.  Pt min assist ambulation with RW with mod-max verbal cues for step length and RW safety.  Nustep for 8 mins on level 5 with just LEs to focus on LLE initiation, weight shifting, and activity tolerance.  Pt very motivated to work on gait related activities.  Pt with increased argumentative this session.  Therapy Documentation Precautions:  Precautions Precautions: Fall Precaution Comments: Dys. 3 textures and thin liquids; significant RLE weakness Required Braces or Orthoses: No Restrictions Weight  Bearing Restrictions: No General:   Vital Signs:   Pain: Pain Assessment Pain Assessment: No/denies pain Pain Score: 0-No pain  See FIM for current functional status  Therapy/Group: Individual Therapy  [name] 06/16/2011, 10:51 AM

## 2011-06-16 NOTE — Progress Notes (Signed)
Patient ID: Maryruth Bun, male   DOB: [birth year], 71 y.o.   MRN: [id number]  Subjective/Complaints:  One BM yest loose Objective: Vital Signs: Blood pressure 123/70, pulse 93, temperature 98.1 F (36.7 C), temperature source Oral, resp. rate 17, height 6\' 1"  (1.854 m), weight 86.093 kg (189 lb 12.8 oz), SpO2 94.00%. No results found. Results for orders placed during the hospital encounter of 06/07/11 (from the past 72 hour(s))  PROTIME-INR     Status: Abnormal   Collection Time   06/14/11 10:50 AM      Component Value Range Comment   Prothrombin Time 28.5 (*) 11.6 - 15.2 (seconds)    INR 2.63 (*) 0.00 - 1.49    PROTIME-INR     Status: Abnormal   Collection Time   06/15/11  5:53 AM      Component Value Range Comment   Prothrombin Time 29.7 (*) 11.6 - 15.2 (seconds)    INR 2.77 (*) 0.00 - 1.49    BASIC METABOLIC PANEL     Status: Abnormal   Collection Time   06/15/11  9:31 AM      Component Value Range Comment   Sodium 139  135 - 145 (mEq/L)    Potassium 3.4 (*) 3.5 - 5.1 (mEq/L)    Chloride 102  96 - 112 (mEq/L)    CO2 28  19 - 32 (mEq/L)    Glucose, Bld 95  70 - 99 (mg/dL)    BUN 9  6 - 23 (mg/dL)    Creatinine, Ser 1.30  0.50 - 1.35 (mg/dL)    Calcium 9.0  8.4 - 10.5 (mg/dL)    GFR calc non Af Amer >90  >90 (mL/min)    GFR calc Af Amer >90  >90 (mL/min)   PROTIME-INR     Status: Abnormal   Collection Time   06/16/11  4:55 AM      Component Value Range Comment   Prothrombin Time 32.8 (*) 11.6 - 15.2 (seconds)    INR 3.15 (*) 0.00 - 1.49      Physical Exam  Nursing note and vitals reviewed.  Constitutional: He appears well-developed.  HENT: PERRL, EOMI  Head: Normocephalic.  Neck: Normal range of motion. No thyromegaly present.  Cardiovascular: Normal rate.  Pulmonary/Chest: Breath sounds normal. He has no wheezes.  Abdominal: He exhibits no distension. There is no tenderness.  Musculoskeletal: He exhibits no edema.  RLE with 2+ edema at ankle Neurological: He  is alert.  Pt alert. Follows simple commnds, needing cueing at times. Right facial droop and tongue deviation. Speech dysarthric but Intelligible. . Patient is oriented x3 with good recall. e.Light touch intact RUE 4/5. RLE 2-/5. Mild right pronator drift and decreased FMC on right. DTR's 3+ on right. Toes up.  Skin: Skin is warm and dry   Assessment/Plan: 1. Functional deficits secondary to L ACA infarct with RLE paresis which require 3+ hours per day of interdisciplinary therapy in a comprehensive inpatient rehab setting. Physiatrist is providing close team supervision and 24 hour management of active medical problems listed below. Physiatrist and rehab team continue to assess barriers to discharge/monitor patient progress toward functional and medical goals. FIM: FIM - Bathing Bathing Steps Patient Completed: Chest;Right Arm;Left Arm;Front perineal area;Buttocks;Right upper leg;Left upper leg;Abdomen;Right lower leg (including foot);Left lower leg (including foot) Bathing: 4: Steadying assist  FIM - Upper Body Dressing/Undressing Upper body dressing/undressing steps patient completed: Thread/unthread right sleeve of pullover shirt/dresss;Thread/unthread left sleeve of pullover shirt/dress;Put head through opening of pull  over shirt/dress;Pull shirt over trunk Upper body dressing/undressing: 5: Set-up assist to: Obtain clothing/put away FIM - Lower Body Dressing/Undressing Lower body dressing/undressing steps patient completed: Thread/unthread right underwear leg;Thread/unthread left underwear leg;Pull underwear up/down;Thread/unthread left pants leg;Pull pants up/down Lower body dressing/undressing: 3: Mod-Patient completed 50-74% of tasks  FIM - Toileting Toileting steps completed by patient: Performs perineal hygiene Toileting Assistive Devices: Grab bar or rail for support Toileting: 2: Max-Patient completed 1 of 3 steps  FIM - Diplomatic Services operational officer Devices: IT sales professional Transfers: 3-To toilet/BSC: Mod A (lift or lower assist)  FIM - Banker Devices: HOB elevated Bed/Chair Transfer: 4: Supine > Sit: Min A (steadying Pt. > 75%/lift 1 leg);4: Bed > Chair or W/C: Min A (steadying Pt. > 75%)  FIM - Locomotion: Wheelchair Distance: 150 Locomotion: Wheelchair: 5: Travels 150 ft or more: maneuvers on rugs and over door sills with supervision, cueing or coaxing FIM - Locomotion: Ambulation Locomotion: Ambulation Assistive Devices: Designer, industrial/product Ambulation/Gait Assistance: 3: Mod assist Locomotion: Ambulation: 4: Travels 150 ft or more with minimal assistance (Pt.>75%)  Comprehension Comprehension Mode: Auditory Comprehension: 5-Understands basic 90% of the time/requires cueing < 10% of the time  Expression Expression Mode: Verbal Expression: 5-Expresses basic 90% of the time/requires cueing < 10% of the time.  Social Interaction Social Interaction: 6-Interacts appropriately with others with medication or extra time (anti-anxiety, antidepressant).  Problem Solving Problem Solving: 5-Solves basic 90% of the time/requires cueing < 10% of the time  Memory Memory: 4-Recognizes or recalls 75 - 89% of the time/requires cueing 10 - 24% of the time  Medical Problem List and Plan:  1. 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 DVT Prophylaxis/Anticoagulation: Chronic Coumadin therapy. Monitor for any signs of bleeding  3. Recurrent seizure. Continue Dilantin and Keppra therapy as per neurology services. Will need followup Dilantin level in next couple days. Monitor for any further seizure activity.  4. Dysphagia. Followup per speech therapy.Pt advanced to D3 diet. Monitor for any signs of aspiration  5. Atrial flutter with RVR. Continue Cardizem and follow up cardiology services. HR controlled at present.  6.  Loose stools  improving  7.  Hypokalemia plus pedal edema reduce KCL and start aldactone 8.  Dementia- needs explantion who his therapists are  LOS (Days) 9 A FACE TO FACE EVALUATION WAS PERFORMED  KIRSTEINS,ANDREW E 06/16/2011, 7:12 AM    Review of Systems  Cardiovascular: Negative for chest pain.  Gastrointestinal: Negative for nausea, vomiting, abdominal pain and diarrhea.  Neurological: Positive for focal weakness.  All other systems reviewed and are negative.

## 2011-06-16 NOTE — Progress Notes (Signed)
Met with pt to report on team conference. Pt stated that he hopes to go home sooner than the target d/c date: 06/29/11 and that he wants to live independently. Called pt's daughter in [place]. She is in agreement with discharge date and understands need for 25/7 supervision. She will either live with him or have him come live with her in [place].  When told that her father says that he wants to continue to live alone, she states, "Just let him say it. But he will do what I tell him to do".

## 2011-06-17 DIAGNOSIS — Z5189 Encounter for other specified aftercare: Secondary | ICD-10-CM

## 2011-06-17 DIAGNOSIS — I6330 Cerebral infarction due to thrombosis of unspecified cerebral artery: Secondary | ICD-10-CM

## 2011-06-17 LAB — PROTIME-INR: Prothrombin Time: 32.9 seconds — ABNORMAL HIGH (ref 11.6–15.2)

## 2011-06-17 MED ORDER — SPIRONOLACTONE 50 MG PO TABS
50.0000 mg | ORAL_TABLET | Freq: Every day | ORAL | Status: DC
  Administered 2011-06-18 – 2011-06-29 (×12): 50 mg via ORAL
  Filled 2011-06-17 (×13): qty 1

## 2011-06-17 MED ORDER — DIPHENOXYLATE-ATROPINE 2.5-0.025 MG PO TABS: 1.0000 | ORAL_TABLET | Freq: Four times a day (QID) | ORAL | Status: DC | PRN

## 2011-06-17 MED ORDER — WARFARIN SODIUM 1 MG PO TABS
1.0000 mg | ORAL_TABLET | Freq: Once | ORAL | Status: AC
  Administered 2011-06-17: 1 mg via ORAL
  Filled 2011-06-17: qty 1

## 2011-06-17 NOTE — Progress Notes (Signed)
Physical Therapy Note  Patient Details  Name: MUKESH KORNEGAY MRN: [id number] Date of Birth: [birth year] Today's Date: 06/17/2011  1300-1330  (30 minutes) individual Pain: no complaint of pain Focus of treatment: Neuro re-ed RT LE in supine and closed chain ; therapeutic activities to decrease assist to maintain standing without AD Treatment: Neuro re-ed RT LE - active hip flexion focusing on eccentric control, manual leg presses, hip abduction, unilateral bridging (in supine X 15); sit to stand to partial sit X 10 hands on knees (pt needs min/mioderate vcs for foot palcement); standing rhythmic stabilization in all directions; standing stepping forward and back LT LE (uninvolved) focusing on full step and increased weight bearing and weight shift to involved extremity.   Marcos Peloso,JIM 06/17/2011, 3:39 PM

## 2011-06-17 NOTE — Progress Notes (Signed)
Occupational Therapy Session Note  Patient Details  Name: [name] MRN: [id number] Date of Birth: [birth year]  Today's Date: 06/17/2011 Time: 1100-1200 Time Calculation (min): 60 min  Short Term Goals: Week 2:  OT Short Term Goal 1 (Week 2): Pt will complete LB dressing with min assist at sit to stand level OT Short Term Goal 2 (Week 2): Pt will complete toilet transfer with min assist stand pivot OT Short Term Goal 3 (Week 2): Pt will complete grooming in standing at sink with min assist.  Skilled Therapeutic Interventions/Progress Updates:    1:1 pt declined bathing and dressing this am. Reported he showered yesterday and already got on clean clothes this am and desired to work on his "right LE"  Functional ambulation with RW to RN station to get a drink (from cabinet and getting a cup with ice) -demonstrating poor safety awareness and poor safety with RW during functional turns and ambulation with RW too far in front of him, decreased awareness right LE outside of RW. With sit to stands pt did not properly place right LE underneath him nor was it in a safe place for a sit to stand. In gym focused on navigating through an obstacle course focusing on RW safety, staying inside of the RW, attention to right LE with mod to max cuing while negotiating  turns and stepping over thresholds similar to his home environment. Neuromuscular re-educ: focus on sit to stand without UE support from different height surfaces, standing balance with weight shifts, hip/knee flexion and extension with out hyperextension stepping onto a block with right and left LE with normal patterns of movement without compensation with trunk. When stepping/ using right LE pt tends to compensate with posturing in trunk. Sat on elevated mat, sitting with out feet support drawing letters with right LE focusing on core strengthening and normal posturing during strengthening and controlled movement in right LE. Re did obstacle  course with green theraband around pt for tactile cue to remain inside the RW- with constant tactile cuing pt required no verbal cuing and was able to remain inside of RW with increased safety, continued to ambulate back to room  Therapy Documentation Precautions:  Precautions Precautions: Fall Precaution Comments: Dys. 3 textures and thin liquids; significant RLE weakness Required Braces or Orthoses: No Restrictions Weight Bearing Restrictions: No Pain: Pain Assessment Pain Assessment: No/denies pain Pain Score: 0-No pain  See FIM for current functional status  Therapy/Group: Individual Therapy  Roney Mans [facility] 06/17/2011, 12:01 PM

## 2011-06-17 NOTE — Progress Notes (Signed)
Component Results     Specimen Source-CDIFCU:    STOOL   Result-CDIFCU:    REPORT   Comment:    (NOTE) Clostridium Difficile Culture with reflex to Toxin Clostridium difficile Culture SOURCE : STOOL Result/Comment: No Clostridium difficile isolated.  C. difficile Toxins A#B not indicated      Contact isolation d/c per protocol. Patient is still having loose stools, poor appetite patient reports had issue prior to admission, discussed with Dr Doroteo Bradford, order antidiarrhea medication, discussed plan with patient verbalized understanding Will continue to monitor. [place], [employer]

## 2011-06-17 NOTE — Progress Notes (Signed)
Speech Language Pathology Daily Session Note & Weekly Progress Update  Patient Details  Name: [name] MRN: [id number] Date of Birth: October 02, 1940  Today's Date: 06/17/2011 Time: 4098-1191 Time Calculation (min): 45 min  Short Term Goals: Week 1: SLP Short Term Goal 1 (Week 1): Patient will consume Dys.3 textures and thin liquids with supervision assist for carryover of compensatory strategies SLP Short Term Goal 2 (Week 1): Patient will request help as needed with BADLs with moderate assist semantic cues SLP Short Term Goal 3 (Week 1): Patient will utilize description during moments of word finding with basic information with mod I  SLP Short Term Goal 4 (Week 1): Patient will follow 2 step directions with moderate assist semantic cues  Skilled Therapeutic Interventions: SLP entered patient's room due to coughing while eating breakfast and facilitated session with moderate assist semantic cues to problem solve to continue hard effortful coughs until suspected penetrates were cleared, at times patient would stop coughing but continue to clear his throat.  Patient reports consuming eggs prior to coughing episode and upon further trials and SLP skilled observations patient demonstrated no coughing and less throat clearing when he consumes puree like textures such as oatmeal, grits.  SLP educated patient on good effective mastication of soft solids and continued need for 2 swallows per bite.  Patient able to return demonstration with supervision semantic cues.  SLP also facilitated session by assist patient will lunch and dinner order; patient required moderate assist semantic cues to verbally express items given choice of 2 with both written and auditory cues.     Daily Session Precautions/Restrictions  Restrictions Weight Bearing Restrictions: No FIM:  Comprehension Comprehension Mode: Auditory Comprehension: 3-Understands basic 50 - 74% of the time/requires cueing 25 - 50%  of the  time Expression Expression Mode: Verbal Expression: 3-Expresses basic 50 - 74% of the time/requires cueing 25 - 50% of the time. Needs to repeat parts of sentences. Social Interaction Social Interaction: 4-Interacts appropriately 75 - 89% of the time - Needs redirection for appropriate language or to initiate interaction. Problem Solving Problem Solving: 3-Solves basic 50 - 74% of the time/requires cueing 25 - 49% of the time Memory Memory: 3-Recognizes or recalls 50 - 74% of the time/requires cueing 25 - 49% of the time FIM - Eating Eating Activity: 5: Supervision/cues General  Amount of Missed SLP Time (min): 15 Minutes Missed Time Reason: Unavailable (Comment) (pt requested to end session early, needed to use bathroom ) Pain Pain Assessment Pain Assessment: No/denies pain Pain Score: 0-No pain  Therapy/Group: Individual Therapy    Speech Language Pathology Weekly Progress Note   Patient Details  Name: [name] MRN: [id number] Date of Birth: Jan 24, 1941  Short Term Goals: Week 1: SLP Short Term Goal 1 (Week 1): Patient will consume Dys.3 textures and thin liquids with supervision assist for carryover of compensatory strategies SLP Short Term Goal 1 - Progress (Week 1): Progressing toward goal SLP Short Term Goal 2 (Week 1): Patient will request help as needed with BADLs with moderate assist semantic cues SLP Short Term Goal 2 - Progress (Week 1): Met SLP Short Term Goal 3 (Week 1): Patient will utilize description during moments of word finding with basic information with mod I  SLP Short Term Goal 3 - Progress (Week 1): Progressing toward goal SLP Short Term Goal 4 (Week 1): Patient will follow 2 step directions with moderate assist semantic cues SLP Short Term Goal 4 - Progress (Week 4): Met Week 2: SLP  Short Term Goal 1 (Week 2): Patient will consume Dys.3 textures and thin liquids without obsevred s/s of aspiration with supervision semantic cues to utilize  compensatory strategies SLP Short Term Goal 2 (Week 2): Patient will consume trials of regular textures with moderate assist semantic cues SLP Short Term Goal 3 (Week 2): Patient will request help as needed with minimal assist sematnic cues SLP Short Term Goal 4 (Week 2): Patient will follow 2 step directions with minimal assist semantic and visual cues SLP Short Term Goal 5 (Week 2): Patient will verbally express basic needs/wants with minimal assist semantic cues  Weekly Progress Updates: Patient met 2 out of 4 short term goal this reporting period with gains in requesting help in planned failure tasks with moderate assist semantic cues and following 2 step directions in a basic familiar task such as wheel chair set up prior to transfer.  Patient has also made progress in the areas of swallow function and use of word finding strategies; however, his decreased awareness, recall and self monitoring impact the level of cuing he requires to safely complete tasks.  As a result he would continue to benefit from skilled SLP services to address these deficits and decrease burden of care upon discharge.   SLP Frequency: 1-2 X/day, 30-60 minutes;5 out of 7 days Estimated Length of Stay: 06/29/11 anticipated discharge date SLP Treatment/Interventions: Cognitive remediation/compensation;Cueing hierarchy;Dysphagia/aspiration precaution training;Environmental controls;Functional tasks;Internal/external aids;Medication managment;Patient/family education;Speech/Language facilitation;Therapeutic Activities  Daily Session Precautions/Restrictions  Restrictions Weight Bearing Restrictions: No Cognition: Overall Cognitive Status: Impaired Arousal/Alertness: Awake/alert Orientation Level: Oriented to person;Oriented to place;Disoriented to time;Oriented to situation Attention: Sustained;Selective Sustained Attention: Appears intact Sustained Attention Impairment: Verbal basic;Functional basic Selective Attention:  Impaired Selective Attention Impairment: Verbal basic;Functional basic Memory: Impaired Memory Impairment: Storage deficit;Retrieval deficit;Decreased recall of new information;Decreased long term memory;Decreased short term memory;Prospective memory Awareness: Impaired Awareness Impairment: Intellectual impairment;Emergent impairment Problem Solving: Impaired Problem Solving Impairment: Verbal basic;Functional basic Executive Function: Reasoning;Sequencing;Organizing;Decision Making;Self Monitoring;Self Correcting Reasoning: Impaired Sequencing: Impaired Organizing: Impaired Decision Making: Impaired Initiating: Appears intact Self Monitoring: Impaired Self Correcting: Impaired Behaviors: Verbal agitation;Poor frustration tolerance Safety/Judgment: Impaired Oral/Motor: Oral Motor/Sensory Function Overall Oral Motor/Sensory Function: Impaired Labial ROM: Reduced right Labial Symmetry: Abnormal symmetry right Labial Strength: Reduced Lingual ROM: Reduced right Lingual Strength: Reduced Facial ROM: Reduced right Motor Speech Overall Motor Speech: Appears within functional limits for tasks assessed (basic expression ) Respiration: Within functional limits Resonance: Within functional limits Articulation: Within functional limitis Intelligibility: Intelligible Motor Planning: Not tested Interfering Components: Hearing loss (sister reports HOH) Comprehension: Auditory Comprehension Overall Auditory Comprehension: Impaired Yes/No Questions: Impaired Complex Questions: 75-100% accurate (80%) Commands: Impaired Two Step Basic Commands: 50-74% accurate Interfering Components: Attention;Processing speed;Working Radio broadcast assistant: Extra processing time;Visual/Gestural cues;Other (Comment) (written aids) Visual Recognition/Discrimination Discrimination: Not tested Reading Comprehension Reading Status: Not tested Expression: Expression Primary Mode of Expression:  Verbal Verbal Expression Overall Verbal Expression: Impaired Level of Generative/Spontaneous Verbalization: Phrase;Sentence Repetition: No impairment Verbal Errors: Not aware of errors (word finding and circumlocution during verbal expression ) Pragmatics: Impairment Impairments: Interpretation of nonverbal communication;Topic maintenance Interfering Components: Premorbid deficit Written Expression Dominant Hand: Right Written Expression: Within Functional Limits Dictation Ability:  (check writing task)  Fae Pippin, M.A., CCC-SLP [phone]

## 2011-06-17 NOTE — Progress Notes (Signed)
ANTICOAGULATION CONSULT NOTE - Follow Up Consult  Pharmacy Consult:  Coumadin Indication: atrial fibrillation  Vital Signs: Temp: 98.6 F (37 C) (03/28 0546) Temp src: Oral (03/28 0546) BP: 137/75 mmHg (03/28 0546) Pulse Rate: 100  (03/28 0546)  Labs:  [name] 06/17/11 0615 06/16/11 6213 06/16/11 0455 06/15/11 0931 06/15/11 0553  HGB -- -- -- -- --  HCT -- -- -- -- --  PLT -- -- -- -- --  APTT -- -- -- -- --  LABPROT 32.9* -- 32.8* -- 29.7*  INR 3.16* -- 3.15* -- 2.77*  HEPARINUNFRC -- -- -- -- --  CREATININE -- 0.65 -- 0.66 --  CKTOTAL -- -- -- -- --  CKMB -- -- -- -- --  TROPONINI -- -- -- -- --   Estimated Creatinine Clearance: 94.3 ml/min (by C-G formula based on Cr of 0.65).    Assessment: 71 yom s/p stroke w/ atrial fibrillation requiring initiation of Coumadin for further stroke prevention. INR slightly elevated and expect it to trend down tomorrow.  No bleeding noted.  Goal of Therapy:  INR 2-3    Plan:  - Coumadin 1mg  PO today - Daily INR - Consider checking a dilantin level to assess therapy    [name], PharmD, BCPS Pager:  [phone] 06/17/2011, 9:56 AM

## 2011-06-17 NOTE — Progress Notes (Signed)
Physical Therapy Note  Patient Details  Name: [name] MRN: [id number] Date of Birth: [birth year] Today's Date: 06/17/2011  4098-1191 (60 minutes) individual Pain: no complaint of pain Focus of treatment: Therapeutic activities to facilitate increased weightbearing RT LE in stance; gait training without AD (Maxi-Sky for support) to facilitate increased weight shift to left/increased swing on right Treatment:: Transfer scoot to squat/pivot min assist for safety with max cues to remove wc armrest for safety; Standing Left knee on mat - pt has difficulty maintaining terminal RT knee extension in stance; tall kneeling to facilitate RT hip and trunk extension min assist; partial squats from tall kneeling x 10; GAIT: using Maxi-Sky for support 20 feet X 4 with decreased weight shift to left and decreased trunk extension in stance; GAIT- treadmill with bilateral UE support @ 0.5 MPH with vcs for bilateral heel toe gait pattern.   Aadan Chenier,JIM 06/17/2011, 10:58 AM

## 2011-06-17 NOTE — Progress Notes (Signed)
Patient ID: [name], male   DOB: 1941/02/08, 71 y.o.   MRN: [id number]  Subjective/Complaints:  One BM yest loose one formed .ros  Objective: Vital Signs: Blood pressure 137/75, pulse 100, temperature 98.6 F (37 C), temperature source Oral, resp. rate 19, height 6\' 1"  (1.854 m), weight 89.4 kg (197 lb 1.5 oz), SpO2 96.00%. No results found. Results for orders placed during the hospital encounter of 06/07/11 (from the past 72 hour(s))  PROTIME-INR     Status: Abnormal   Collection Time   06/14/11 10:50 AM      Component Value Range Comment   Prothrombin Time 28.5 (*) 11.6 - 15.2 (seconds)    INR 2.63 (*) 0.00 - 1.49    PROTIME-INR     Status: Abnormal   Collection Time   06/15/11  5:53 AM      Component Value Range Comment   Prothrombin Time 29.7 (*) 11.6 - 15.2 (seconds)    INR 2.77 (*) 0.00 - 1.49    BASIC METABOLIC PANEL     Status: Abnormal   Collection Time   06/15/11  9:31 AM      Component Value Range Comment   Sodium 139  135 - 145 (mEq/L)    Potassium 3.4 (*) 3.5 - 5.1 (mEq/L)    Chloride 102  96 - 112 (mEq/L)    CO2 28  19 - 32 (mEq/L)    Glucose, Bld 95  70 - 99 (mg/dL)    Dominguez 9  6 - 23 (mg/dL)    Creatinine, Ser 1.32  0.50 - 1.35 (mg/dL)    Calcium 9.0  8.4 - 10.5 (mg/dL)    GFR calc non Af Amer >90  >90 (mL/min)    GFR calc Af Amer >90  >90 (mL/min)   PROTIME-INR     Status: Abnormal   Collection Time   06/16/11  4:55 AM      Component Value Range Comment   Prothrombin Time 32.8 (*) 11.6 - 15.2 (seconds)    INR 3.15 (*) 0.00 - 1.49    BASIC METABOLIC PANEL     Status: Abnormal   Collection Time   06/16/11  8:35 AM      Component Value Range Comment   Sodium 140  135 - 145 (mEq/L)    Potassium 3.4 (*) 3.5 - 5.1 (mEq/L)    Chloride 103  96 - 112 (mEq/L)    CO2 26  19 - 32 (mEq/L)    Glucose, Bld 113 (*) 70 - 99 (mg/dL)    Dominguez 9  6 - 23 (mg/dL)    Creatinine, Ser 4.40  0.50 - 1.35 (mg/dL)    Calcium 9.0  8.4 - 10.5 (mg/dL)    GFR calc non Af  Amer >90  >90 (mL/min)    GFR calc Af Amer >90  >90 (mL/min)   PROTIME-INR     Status: Abnormal   Collection Time   06/17/11  6:15 AM      Component Value Range Comment   Prothrombin Time 32.9 (*) 11.6 - 15.2 (seconds)    INR 3.16 (*) 0.00 - 1.49      Physical Exam  Nursing note and vitals reviewed.  Constitutional: He appears well-developed.  HENT: PERRL, EOMI  Head: Normocephalic.  Neck: Normal range of motion. No thyromegaly present.  Cardiovascular: Normal rate.  Pulmonary/Chest: Breath sounds normal. He has no wheezes.  Abdominal: He exhibits no distension. There is no tenderness.  Musculoskeletal: He exhibits no edema.  RLE with 2+ edema at ankle Neurological: He is alert.  Pt alert. Follows simple commnds, needing cueing at times. Orientation to person, place not time Right facial droop and tongue deviation. Speech dysarthric but Intelligible. . Patient is oriented x3 with good recall. Dominguez.Light touch intact RUE 4/5. RLE 2-/5. Mild right pronator drift and decreased FMC on right. DTR's 3+ on right. Toes up.  Skin: Skin is warm and dry   Assessment/Plan: 1. Functional deficits secondary to L ACA infarct with RLE paresis which require 3+ hours per day of interdisciplinary therapy in a comprehensive inpatient rehab setting. Physiatrist is providing close team supervision and 24 hour management of active medical problems listed below. Physiatrist and rehab team continue to assess barriers to discharge/monitor patient progress toward functional and medical goals. FIM: FIM - Bathing Bathing Steps Patient Completed: Chest;Right Arm;Left Arm;Abdomen;Front perineal area;Buttocks;Right upper leg;Left upper leg;Right lower leg (including foot);Left lower leg (including foot) Bathing: 4: Steadying assist  FIM - Upper Body Dressing/Undressing Upper body dressing/undressing steps patient completed: Thread/unthread right sleeve of pullover shirt/dresss;Thread/unthread left sleeve of pullover  shirt/dress;Put head through opening of pull over shirt/dress;Pull shirt over trunk Upper body dressing/undressing: 5: Set-up assist to: Obtain clothing/put away FIM - Lower Body Dressing/Undressing Lower body dressing/undressing steps patient completed: Thread/unthread right underwear leg;Thread/unthread left underwear leg;Pull underwear up/down;Thread/unthread right pants leg;Thread/unthread left pants leg;Pull pants up/down;Don/Doff left sock Lower body dressing/undressing: 4: Min-Patient completed 75 plus % of tasks  FIM - Toileting Toileting steps completed by patient: Adjust clothing prior to toileting;Performs perineal hygiene;Adjust clothing after toileting Toileting Assistive Devices: Grab bar or rail for support Toileting: 4: Steadying assist  FIM - Diplomatic Services operational officer Devices: Grab bars Toilet Transfers: 3-To toilet/BSC: Mod A (lift or lower assist);3-From toilet/BSC: Mod A (lift or lower assist)  FIM - Bed/Chair Transfer Bed/Chair Transfer Assistive Devices: Therapist, occupational: 5: Supine > Sit: Supervision (verbal cues/safety issues);5: Sit > Supine: Supervision (verbal cues/safety issues);4: Bed > Chair or W/C: Min A (steadying Pt. > 75%);4: Chair or W/C > Bed: Min A (steadying Pt. > 75%)  FIM - Locomotion: Wheelchair Distance: 150 Locomotion: Wheelchair: 0: Activity did not occur FIM - Locomotion: Ambulation Locomotion: Ambulation Assistive Devices: Designer, industrial/product Ambulation/Gait Assistance: 3: Mod assist Locomotion: Ambulation: 3: Travels 150 ft or more with moderate assistance (Pt: 50 - 74%)  Comprehension Comprehension Mode: Auditory Comprehension: 4-Understands basic 75 - 89% of the time/requires cueing 10 - 24% of the time  Expression Expression Mode: Verbal Expression: 4-Expresses basic 75 - 89% of the time/requires cueing 10 - 24% of the time. Needs helper to occlude trach/needs to repeat words.  Social Interaction Social  Interaction: 4-Interacts appropriately 75 - 89% of the time - Needs redirection for appropriate language or to initiate interaction.  Problem Solving Problem Solving: 4-Solves basic 75 - 89% of the time/requires cueing 10 - 24% of the time  Memory Memory: 3-Recognizes or recalls 50 - 74% of the time/requires cueing 25 - 49% of the time  Medical Problem List and Plan:  1. 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 DVT Prophylaxis/Anticoagulation: Chronic Coumadin therapy. Monitor for any signs of bleeding  3. Recurrent seizure. Continue Dilantin and Keppra therapy as per neurology services. Will need followup Dilantin level in next couple days. Monitor for any further seizure activity.  4. Dysphagia. Followup per speech therapy.Pt advanced to D3 diet. Monitor for any signs of  aspiration  5. Atrial flutter with RVR. Continue Cardizem and follow up cardiology services. HR controlled at present.  6.  Loose stools improving  7.  Hypokalemia plus pedal edema reduce KCL and start aldactone 8.  Dementia- needs explantion who his therapists are  LOS (Days) 10 A FACE TO FACE EVALUATION WAS PERFORMED  [name] 06/17/2011, 8:38 AM    Review of Systems  Cardiovascular: Positive for leg swelling. Negative for chest pain.  Gastrointestinal: Negative for nausea, vomiting, abdominal pain and diarrhea.  Neurological: Positive for focal weakness.  All other systems reviewed and are negative.

## 2011-06-18 LAB — PROTIME-INR: INR: 2.67 — ABNORMAL HIGH (ref 0.00–1.49)

## 2011-06-18 LAB — ALBUMIN: Albumin: 3.4 g/dL — ABNORMAL LOW (ref 3.5–5.2)

## 2011-06-18 MED ORDER — WARFARIN SODIUM 2 MG PO TABS
2.0000 mg | ORAL_TABLET | Freq: Once | ORAL | Status: AC
  Administered 2011-06-18: 2 mg via ORAL
  Filled 2011-06-18: qty 1

## 2011-06-18 NOTE — Progress Notes (Signed)
Physical Therapy Session Note  Patient Details  Name: [name] MRN: [id number] Date of Birth: 01-Jul-1940  Today's Date: 06/18/2011 Time: 4098-1191 Time Calculation (min): 31 min  Short Term Goals: Week 2:  PT Short Term Goal 1 (Week 2): = LTG: supervision overall  Therapy Documentation Precautions:  Precautions Precautions: Fall Precaution Comments: Dys. 3 textures and thin liquids; significant RLE weakness Required Braces or Orthoses: No Restrictions Weight Bearing Restrictions: No Vital Signs: Therapy Vitals Temp: 99 F (37.2 C) Temp src: Oral Pulse Rate: 93  Resp: 20  BP: 127/73 mmHg Patient Position, if appropriate: Sitting Oxygen Therapy SpO2: 98 % O2 Device: None (Room air) Pain: Pain Assessment Pain Assessment: No/denies pain Pain Score: 0-No pain  Locomotion :  Gait training in controlled environment x 100' x 2 with RW with mod verbal and tactile cues to focus on upright trunk, full hip extension RLE in terminal stance and full step and stride length and R foot clearance in swing and while changing directions while minimizing use of cervical extension and shoulder elevation for stability   Other Treatments:   NMR: for activation and sustained activation of R glutes and hamstring muscles in open and closed chain exercises: prone on mat during R hip extension AAROM and isometric contraction, prone R hamstring curls concentric, isometric and eccentric training, quadruped shoulder, core and hip stabilization training during alternating R and LLE hip and knee extension, and supine single RLE bridging with AAROM; max to total tactile and verbal cues needed for correct muscle activation.  See FIM for current functional status  Therapy/Group: Individual Therapy  Edman Circle [facility] 06/18/2011, 3:34 PM

## 2011-06-18 NOTE — Progress Notes (Signed)
Occupational Therapy Session Note  Patient Details  Name: [name] MRN: [id number] Date of Birth: 09-09-1940  Today's Date: 06/18/2011 Time: 1030-1110 Time Calculation (min): 40 min  Short Term Goals: Week 2:  OT Short Term Goal 1 (Week 2): Pt will complete LB dressing with min assist at sit to stand level OT Short Term Goal 2 (Week 2): Pt will complete toilet transfer with min assist stand pivot OT Short Term Goal 3 (Week 2): Pt will complete grooming in standing at sink with min assist.  Skilled Therapeutic Interventions/Progress Updates:    Pt seen for ADL retraining at walk-in shower level in room.  Focus on functional mobility with RW in room; safety with transfers, sit <> stand, and turning; engaging RLE in all tasks.  Pt requested for this therapist to leave the room while he was showering, stating that he needed privacy.  Educated pt on his high level of fall risk, increased by his RLE weakness and needing to have someone in the bathroom with him while he bathes due to increased fall risk with wet floor.  Pt completed 50% of bathing at seated level, standing to wash bottom and rinse off.  Pt with LOB posteriorly requiring min assist to correct, pt blames therapist for his losing his balance.  Pt was turning around to sit back on tub bench when he began to fall.  This therapist caught pt and lowered him to the ground.  Pt was able to get his feet underneath him and stand up without requiring a lift device and mod assist from this therapist.  Pt completed drying off and completed dressing at a sit to stand level.  Pt requested to return to bed.  RN informed of assisted fall in shower, nurse tech in to check vitals.  Pt reports he "slips" at home and that this wasn't a big deal.  Continue to educate on safety with all mobility.  Therapy Documentation Precautions:  Precautions Precautions: Fall Precaution Comments: Dys. 3 textures and thin liquids; significant RLE  weakness Required Braces or Orthoses: No Restrictions Weight Bearing Restrictions: No General: General Amount of Missed OT Time (min): 20 Minutes Vital Signs: Therapy Vitals Temp: 99 F (37.2 C) Temp src: Oral Pulse Rate: 96  Resp: 18  BP: 126/71 mmHg Patient Position, if appropriate: Sitting Oxygen Therapy SpO2: 97 % O2 Device: None (Room air) Pain: Pain Assessment Pain Assessment: No/denies pain  See FIM for current functional status  Therapy/Group: Individual Therapy  [name] 06/18/2011, 12:21 PM

## 2011-06-18 NOTE — Progress Notes (Addendum)
Physical Therapy Session Note  Patient Details  Name: [name] MRN: [id number] Date of Birth: June 12, 1940  Today's Date: 06/18/2011 [id number] and [id number] Time Calculation (min): 55 min and 30 min  Short Term Goals:  Week 2:  PT Short Term Goal 1 (Week 2): = LTG: supervision overall  Skilled Therapeutic Interventions/Progress Updates: AM  therapy focused on neuromuscular re-education via manual and VCs, demo and visual feedback; dynamic standing balance activities, therapeutic exercise performed with LE and UEs to increase strength for functional mobility, gait training. In sitting in w/c, donned pants and bil socks and shoes with min A.  Core strengtheing for balance reactions with trunk shortening/lengthening with wt shifting; NuSTep at level 4 x 5.5 minutes rated 13 on BORG.  Gait training with RW x 125', x 160' with supervision to min assist, focusing on R hip and knee flexion to prevent catching R toes during swing phase, also decrease pressure of bil UEs on RW for increased use of bil LEs.  Danamic standing on compliant surface with supervison for static, min> mod assist for balance during trunk rotation R><L for theapeutic task involving R hand.  Pt exhbits delayed and inadequate ankle and strategies during LOB backwards.  Unsafe behavior noted with attempt to stand up from NuStep using RW, when therapist was not with him; pt lost balance backwards and sat down without problem.    PM RLE neuromuscular re-education via forced use, demo, visual cues for R toe tapping on cones, focusing on graded movement; mod A for balance.  Floor transfers x 3 with close supervision to min assist, max VCs each trial for technique.  Advance gait training sideways and backwards with RW, min A, focusing on RLE hip and knee flexion to clear foot.  Therapeutic exercise performed with RLE hip and knee flexion to increase strength for functional mobility. Gait x 150' with supervision to min A, mod VCs  for staying close to RW.  When conversing with his sister, pt was distracted and unsafe with use of RW.  When fatigued, pt attempted to sit suddenly without turning himself and RW safely.    Therapy Documentation Precautions:  Precautions Precautions: Fall Precaution Comments: Dys. 3 textures and thin liquids; significant RLE weakness Required Braces or Orthoses: No Restrictions Weight Bearing Restrictions: No    Vital Signs: Therapy Vitals  Pulse Rate: 104  BP: 126/65 mmHg Patient Position, if appropriate: Sitting after 5 minutes ex on NuStep  Pain: Pain Assessment Pain Assessment: No/denies pain AM and PM Mobility:   Locomotion : Ambulation Ambulation/Gait Assistance: 4: Min assist        See FIM for current functional status  Therapy/Group: Individual Therapy  Dannia Snook 06/18/2011, 2:04 PM

## 2011-06-18 NOTE — Progress Notes (Signed)
Speech Language Pathology Daily Session Note  Patient Details  Name: [name] MRN: [id number] Date of Birth: 1941/02/20  Today's Date: 06/18/2011 Time: 1335-1400 Time Calculation (min): 25 min  Short Term Goals: Week 2: SLP Short Term Goal 1 (Week 2): Patient will consume Dys.3 textures and thin liquids without obsevred s/s of aspiration with supervision semantic cues to utilize compensatory strategies SLP Short Term Goal 2 (Week 2): Patient will consume trials of regular textures with moderate assist semantic cues SLP Short Term Goal 3 (Week 2): Patient will request help as needed with minimal assist sematnic cues SLP Short Term Goal 4 (Week 2): Patient will follow 2 step directions with minimal assist semantic and visual cues SLP Short Term Goal 5 (Week 2): Patient will verbally express basic needs/wants with minimal assist semantic cues  Skilled Therapeutic Interventions: Session focused on receptive and expressive language abilities; patient required moderate assist semantic, visual cues to follow 2 step directions with transfers from chair to bed; moderate assist semantic and visual cues to verbally complete comparing and contrasting common objects in pictures with awareness of difficulty but no attempts to utilize strategies.     Daily Session Precautions/Restrictions    FIM:  Comprehension Comprehension Mode: Auditory Comprehension: 3-Understands basic 50 - 74% of the time/requires cueing 25 - 50%  of the time Expression Expression Mode: Verbal Expression: 4-Expresses basic 75 - 89% of the time/requires cueing 10 - 24% of the time. Needs helper to occlude trach/needs to repeat words. Social Interaction Social Interaction: 4-Interacts appropriately 75 - 89% of the time - Needs redirection for appropriate language or to initiate interaction. Problem Solving Problem Solving: 3-Solves basic 50 - 74% of the time/requires cueing 25 - 49% of the time Memory Memory:  3-Recognizes or recalls 50 - 74% of the time/requires cueing 25 - 49% of the time FIM - Eating Eating Activity: 5: Supervision/cues General    Pain Pain Assessment Pain Assessment: No/denies pain Pain Score: 0-No pain  Therapy/Group: Individual Therapy  Charlane Ferretti., CCC-SLP [phone]  [name] 06/18/2011, 3:09 PM

## 2011-06-18 NOTE — Plan of Care (Signed)
Problem: RH SAFETY Goal: RH STG ADHERE TO SAFETY PRECAUTIONS W/ASSISTANCE/DEVICE STG Adhere to Safety Precautions With min. Assistance/Device.  Pt call appropriately. When needing to use restroom

## 2011-06-18 NOTE — Progress Notes (Signed)
Patient ID: [name], male   DOB: 10-May-1940, 71 y.o.   MRN: [id number]  Subjective/Complaints:  One BM yest loose one formed  Objective: Vital Signs: Blood pressure 115/64, pulse 72, temperature 98.1 F (36.7 C), temperature source Oral, resp. rate 18, height 6\' 1"  (1.854 m), weight 89.4 kg (197 lb 1.5 oz), SpO2 95.00%. No results found. Results for orders placed during the hospital encounter of 06/07/11 (from the past 72 hour(s))  BASIC METABOLIC PANEL     Status: Abnormal   Collection Time   06/15/11  9:31 AM      Component Value Range Comment   Sodium 139  135 - 145 (mEq/L)    Potassium 3.4 (*) 3.5 - 5.1 (mEq/L)    Chloride 102  96 - 112 (mEq/L)    CO2 28  19 - 32 (mEq/L)    Glucose, Bld 95  70 - 99 (mg/dL)    Dominguez 9  6 - 23 (mg/dL)    Creatinine, Ser 4.09  0.50 - 1.35 (mg/dL)    Calcium 9.0  8.4 - 10.5 (mg/dL)    GFR calc non Af Amer >90  >90 (mL/min)    GFR calc Af Amer >90  >90 (mL/min)   PROTIME-INR     Status: Abnormal   Collection Time   06/16/11  4:55 AM      Component Value Range Comment   Prothrombin Time 32.8 (*) 11.6 - 15.2 (seconds)    INR 3.15 (*) 0.00 - 1.49    BASIC METABOLIC PANEL     Status: Abnormal   Collection Time   06/16/11  8:35 AM      Component Value Range Comment   Sodium 140  135 - 145 (mEq/L)    Potassium 3.4 (*) 3.5 - 5.1 (mEq/L)    Chloride 103  96 - 112 (mEq/L)    CO2 26  19 - 32 (mEq/L)    Glucose, Bld 113 (*) 70 - 99 (mg/dL)    Dominguez 9  6 - 23 (mg/dL)    Creatinine, Ser 8.11  0.50 - 1.35 (mg/dL)    Calcium 9.0  8.4 - 10.5 (mg/dL)    GFR calc non Af Amer >90  >90 (mL/min)    GFR calc Af Amer >90  >90 (mL/min)   PROTIME-INR     Status: Abnormal   Collection Time   06/17/11  6:15 AM      Component Value Range Comment   Prothrombin Time 32.9 (*) 11.6 - 15.2 (seconds)    INR 3.16 (*) 0.00 - 1.49    PROTIME-INR     Status: Abnormal   Collection Time   06/18/11  6:40 AM      Component Value Range Comment   Prothrombin Time 28.9  (*) 11.6 - 15.2 (seconds)    INR 2.67 (*) 0.00 - 1.49      Physical Exam  Nursing note and vitals reviewed.  Constitutional: He appears well-developed.  HENT: PERRL, EOMI  Head: Normocephalic.  Neck: Normal range of motion. No thyromegaly present.  Cardiovascular: Normal rate.  Pulmonary/Chest: Breath sounds normal. He has no wheezes.  Abdominal: He exhibits no distension. There is no tenderness.  Musculoskeletal: He exhibits no edema.  RLE with 2+ edema at ankle Neurological: He is alert.  Pt alert. Follows simple commnds, needing cueing at times. Orientation to person, place not time Right facial droop and tongue deviation. Speech dysarthric but Intelligible. . Patient is oriented x3 with good recall. e.Light touch intact RUE  4/5. RLE 2-/5. Mild right pronator drift and decreased FMC on right. DTR's 3+ on right. Toes up.  Skin: Skin is warm and dry   Assessment/Plan: 1. Functional deficits secondary to L ACA infarct with RLE paresis which require 3+ hours per day of interdisciplinary therapy in a comprehensive inpatient rehab setting. Physiatrist is providing close team supervision and 24 hour management of active medical problems listed below. Physiatrist and rehab team continue to assess barriers to discharge/monitor patient progress toward functional and medical goals. FIM: FIM - Bathing Bathing Steps Patient Completed: Chest;Right Arm;Left Arm;Abdomen;Front perineal area;Buttocks;Right upper leg;Left upper leg;Right lower leg (including foot);Left lower leg (including foot) Bathing: 4: Steadying assist  FIM - Upper Body Dressing/Undressing Upper body dressing/undressing steps patient completed: Thread/unthread right sleeve of pullover shirt/dresss;Thread/unthread left sleeve of pullover shirt/dress;Put head through opening of pull over shirt/dress;Pull shirt over trunk Upper body dressing/undressing: 5: Set-up assist to: Obtain clothing/put away FIM - Lower Body  Dressing/Undressing Lower body dressing/undressing steps patient completed: Thread/unthread right underwear leg;Thread/unthread left underwear leg;Pull underwear up/down;Thread/unthread right pants leg;Thread/unthread left pants leg;Pull pants up/down;Don/Doff left sock Lower body dressing/undressing: 4: Min-Patient completed 75 plus % of tasks  FIM - Toileting Toileting steps completed by patient: Adjust clothing prior to toileting Toileting Assistive Devices: Grab bar or rail for support Toileting: 4: Steadying assist  FIM - Diplomatic Services operational officer Devices: Grab bars Toilet Transfers: 3-To toilet/BSC: Mod A (lift or lower assist);3-From toilet/BSC: Mod A (lift or lower assist)  FIM - Bed/Chair Transfer Bed/Chair Transfer Assistive Devices: Therapist, occupational: 5: Supine > Sit: Supervision (verbal cues/safety issues);5: Sit > Supine: Supervision (verbal cues/safety issues);4: Bed > Chair or W/C: Min A (steadying Pt. > 75%);4: Chair or W/C > Bed: Min A (steadying Pt. > 75%)  FIM - Locomotion: Wheelchair Distance: 150 Locomotion: Wheelchair: 5: Travels 150 ft or more: maneuvers on rugs and over door sills with supervision, cueing or coaxing FIM - Locomotion: Ambulation Locomotion: Ambulation Assistive Devices: Designer, industrial/product Ambulation/Gait Assistance: 3: Mod assist Locomotion: Ambulation: 3: Travels 150 ft or more with moderate assistance (Pt: 50 - 74%)  Comprehension Comprehension Mode: Auditory Comprehension: 3-Understands basic 50 - 74% of the time/requires cueing 25 - 50%  of the time  Expression Expression Mode: Verbal Expression: 3-Expresses basic 50 - 74% of the time/requires cueing 25 - 50% of the time. Needs to repeat parts of sentences.  Social Interaction Social Interaction: 4-Interacts appropriately 75 - 89% of the time - Needs redirection for appropriate language or to initiate interaction.  Problem Solving Problem Solving: 3-Solves basic  50 - 74% of the time/requires cueing 25 - 49% of the time  Memory Memory: 3-Recognizes or recalls 50 - 74% of the time/requires cueing 25 - 49% of the time  Medical Problem List and Plan:  1. 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 DVT Prophylaxis/Anticoagulation: Chronic Coumadin therapy. Monitor for any signs of bleeding  3. Recurrent seizure. Continue Dilantin and Keppra therapy as per neurology services. Will need followup Dilantin level in next couple days. Monitor for any further seizure activity.  4. Dysphagia. Followup per speech therapy.Pt advanced to D3 diet. Monitor for any signs of aspiration  5. Atrial flutter with RVR. Continue Cardizem and follow up cardiology services. HR controlled at present.  6.  Loose stools improving  7.  Hypokalemia plus pedal edema reduce KCL and start aldactone 8.  Dementia- needs explantion  who his therapists are  LOS (Days) 11 A FACE TO FACE EVALUATION WAS PERFORMED  Alece Koppel E 06/18/2011, 8:04 AM    Review of Systems  Cardiovascular: Positive for leg swelling. Negative for chest pain.  Gastrointestinal: Negative for nausea, vomiting, abdominal pain and diarrhea.  Neurological: Positive for focal weakness.  All other systems reviewed and are negative.

## 2011-06-18 NOTE — Progress Notes (Signed)
ANTICOAGULATION CONSULT NOTE - Follow Up Consult  Pharmacy Consult for coumadin Indication: atrial fibrillation  Vital Signs: Temp: 98.1 F (36.7 C) (03/29 0500) Temp src: Oral (03/29 0500) BP: 115/64 mmHg (03/29 0500) Pulse Rate: 72  (03/29 0500)  Labs:  [name] 06/18/11 0640 06/17/11 0615 06/16/11 0835 06/16/11 0455  HGB -- -- -- --  HCT -- -- -- --  PLT -- -- -- --  APTT -- -- -- --  LABPROT 28.9* 32.9* -- 32.8*  INR 2.67* 3.16* -- 3.15*  HEPARINUNFRC -- -- -- --  CREATININE -- -- 0.65 --  CKTOTAL -- -- -- --  CKMB -- -- -- --  TROPONINI -- -- -- --   Estimated Creatinine Clearance: 94.3 ml/min (by C-G formula based on Cr of 0.65).  Assessment: 71 yom s/p stroke w/ atrial fibrillation requiring initiation of coumadin for further stroke prevention. INR now at goal. No bleeding noted.  Goal of Therapy:  INR 2-3   Plan:  1. Coumadin 2mg  PO x 1 tonight 2. F/u AM INR 3. Consider ordering a phenytoin level to assess therapy  Renso Swett, [name] 06/18/2011,10:15 AM

## 2011-06-19 MED ORDER — WARFARIN SODIUM 3 MG PO TABS
3.0000 mg | ORAL_TABLET | Freq: Once | ORAL | Status: AC
  Administered 2011-06-19: 3 mg via ORAL
  Filled 2011-06-19: qty 1

## 2011-06-19 NOTE — Progress Notes (Signed)
ANTICOAGULATION CONSULT NOTE - Follow Up Consult  Pharmacy Consult for coumadin Indication: atrial fibrillation  Vital Signs: Temp: 98.8 F (37.1 C) (03/30 0540) Temp src: Oral (03/30 0540) BP: 116/72 mmHg (03/30 0540) Pulse Rate: 77  (03/30 0540)  Labs:  Basename 06/19/11 0630 06/18/11 0640 06/17/11 0615  HGB -- -- --  HCT -- -- --  PLT -- -- --  APTT -- -- --  LABPROT 24.3* 28.9* 32.9*  INR 2.14* 2.67* 3.16*  HEPARINUNFRC -- -- --  CREATININE -- -- --  CKTOTAL -- -- --  CKMB -- -- --  TROPONINI -- -- --   Estimated Creatinine Clearance: 94.3 ml/min (by C-G formula based on Cr of 0.65).  Assessment: 71 yom s/p stroke w/ atrial fibrillation requiring initiation of coumadin for further stroke prevention. INR remains at goal but is trending down. No bleeding noted.   Goal of Therapy:  INR 2-3   Plan:  1. Coumadin 3mg  PO x 1 tonight 2. F/u AM INR 3. F/u Free phenytoin level  Emberli Ballester, [name] 06/19/2011,9:39 AM

## 2011-06-19 NOTE — Progress Notes (Signed)
Occupational Therapy Session Note  Patient Details  Name: [name] MRN: [id number] Date of Birth: 06-23-1940  Today's Date: 06/19/2011 Time: 1400-1445 Time Calculation (min): 45 min  Short Term Goals: Week 2:  OT Short Term Goal 1 (Week 2): Pt will complete LB dressing with min assist at sit to stand level OT Short Term Goal 2 (Week 2): Pt will complete toilet transfer with min assist stand pivot OT Short Term Goal 3 (Week 2): Pt will complete grooming in standing at sink with min assist.  Skilled Therapeutic Interventions/Progress Updates:    Pt engaged in various dynamic standing activities and functional ambulation with r/w to retrieve items from various heights.  Pt required min verbal cues for hand placement when performing stand to sit.  Pt required min verbal cues to walk close to r/w and to maintain upright posture.  Pt hesitant to turn head to right or left when ambulating but denies dizziness.  Practiced stepping over into simulated walk-in shower.  Pt required min verbal cues for safety awareness.  Pt exhibited LOB X 3 when turning to right in preparation for stand to sit.  Therapy Documentation Precautions:  Precautions Precautions: Fall Precaution Comments: Dys. 3 textures and thin liquids; significant RLE weakness Required Braces or Orthoses: No Restrictions Weight Bearing Restrictions: No   Pain:  Pt denies pain  See FIM for current functional status  Therapy/Group: Individual Therapy  Rich Brave 06/19/2011, 3:02 PM

## 2011-06-19 NOTE — Progress Notes (Signed)
Patient ID: [name], male   DOB: [birth year], 71 y.o.   MRN: [id number]  Subjective/Complaints:  Review of Systems  Constitutional: Negative for fever.  Cardiovascular: Negative for chest pain.  Gastrointestinal: Negative for nausea.  Neurological: Negative for headaches.  All other systems reviewed and are negative.  Patient feels well. No specific complaints.  Objective: Vital Signs: Blood pressure 116/72, pulse 77, temperature 98.8 F (37.1 C), temperature source Oral, resp. rate 18, height 6\' 1"  (1.854 m), weight 197 lb 1.5 oz (89.4 kg), SpO2 96.00%. No results found.   Physical Exam  Nursing note and vitals reviewed.  Constitutional: He appears well-developed.  HENT: PERRL, EOMI  Head: Normocephalic.  Neck: Normal range of motion. No thyromegaly present.  Cardiovascular: Normal rate.  Pulmonary/Chest: Breath sounds normal. He has no wheezes.  Abdominal: He exhibits no distension. There is no tenderness.  Musculoskeletal: He exhibits no edema.   Assessment/Plan: 1. Functional deficits secondary to L ACA infarct with RLE paresis which require 3+ hours per day of interdisciplinary therapy in a comprehensive inpatient rehab setting. Physiatrist is providing close team supervision and 24 hour management of active medical problems listed below. Physiatrist and rehab team continue to assess barriers to discharge/monitor patient progress toward functional and medical goals. FIM: FIM - Bathing Bathing Steps Patient Completed: Chest;Right Arm;Left Arm;Abdomen;Front perineal area;Buttocks;Right upper leg;Left upper leg;Right lower leg (including foot);Left lower leg (including foot) Bathing: 4: Steadying assist  FIM - Upper Body Dressing/Undressing Upper body dressing/undressing steps patient completed: Thread/unthread right sleeve of pullover shirt/dresss;Thread/unthread left sleeve of pullover shirt/dress;Put head through opening of pull over shirt/dress;Pull shirt over  trunk Upper body dressing/undressing: 5: Set-up assist to: Obtain clothing/put away FIM - Lower Body Dressing/Undressing Lower body dressing/undressing steps patient completed: Thread/unthread right underwear leg;Thread/unthread left underwear leg;Pull underwear up/down;Don/Doff left sock Lower body dressing/undressing: 4: Min-Patient completed 75 plus % of tasks  FIM - Toileting Toileting steps completed by patient: Adjust clothing prior to toileting Toileting Assistive Devices: Grab bar or rail for support Toileting: 4: Steadying assist  FIM - Diplomatic Services operational officer Devices: Grab bars Toilet Transfers: 3-To toilet/BSC: Mod A (lift or lower assist);3-From toilet/BSC: Mod A (lift or lower assist)  FIM - Bed/Chair Transfer Bed/Chair Transfer Assistive Devices: Therapist, occupational: 5: Supine > Sit: Supervision (verbal cues/safety issues);5: Sit > Supine: Supervision (verbal cues/safety issues);4: Bed > Chair or W/C: Min A (steadying Pt. > 75%);4: Chair or W/C > Bed: Min A (steadying Pt. > 75%)  FIM - Locomotion: Wheelchair Distance: 150 Locomotion: Wheelchair: 0: Activity did not occur FIM - Locomotion: Ambulation Locomotion: Ambulation Assistive Devices: Designer, industrial/product Ambulation/Gait Assistance: 4: Min assist Locomotion: Ambulation: 4: Travels 150 ft or more with minimal assistance (Pt.>75%)  Comprehension Comprehension Mode: Auditory Comprehension: 3-Understands basic 50 - 74% of the time/requires cueing 25 - 50%  of the time  Expression Expression Mode: Verbal Expression: 4-Expresses basic 75 - 89% of the time/requires cueing 10 - 24% of the time. Needs helper to occlude trach/needs to repeat words.  Social Interaction Social Interaction: 4-Interacts appropriately 75 - 89% of the time - Needs redirection for appropriate language or to initiate interaction.  Problem Solving Problem Solving: 3-Solves basic 50 - 74% of the time/requires cueing 25 -  49% of the time  Memory Memory: 3-Recognizes or recalls 50 - 74% of the time/requires cueing 25 - 49% of the time  Medical Problem List and Plan:  1. left frontal lobe infarct as well as remote hemorrhagic  infarct posterior left insular cortex and superior left temporal lobe  2.. DVT Prophylaxis/Anticoagulation: Chronic Coumadin therapy. Monitor for any signs of bleeding  3. Recurrent seizure. Continue Dilantin and Keppra therapy as per neurology services. Will need followup Dilantin level in next couple days. Monitor for any further seizure activity.  4. Dysphagia. Followup per speech therapy.Pt advanced to D3 diet. Monitor for any signs of aspiration  5. Atrial flutter with RVR. Continue Cardizem and follow up cardiology services. HR controlled at present.  6.  Loose stools improving  7.  Hypokalemia plus pedal edema reduce KCL and start aldactone 8.  Dementia- needs explantion who his therapists are  LOS (Days) 12 A FACE TO FACE EVALUATION WAS PERFORMED  [name] 06/19/2011, 10:13 AM    Review of Systems  Constitutional: Negative for fever.  Cardiovascular: Negative for chest pain.  Gastrointestinal: Negative for nausea.  Neurological: Negative for headaches.  All other systems reviewed and are negative.

## 2011-06-20 LAB — PROTIME-INR: INR: 1.96 — ABNORMAL HIGH (ref 0.00–1.49)

## 2011-06-20 MED ORDER — WARFARIN SODIUM 3 MG PO TABS
3.0000 mg | ORAL_TABLET | Freq: Once | ORAL | Status: AC
  Administered 2011-06-20: 3 mg via ORAL
  Filled 2011-06-20: qty 1

## 2011-06-20 NOTE — Progress Notes (Signed)
2 plus pitting edema to right lower extremity. Non-pitting edema to left foot and ankle. Using bedalarm at [facility] for decreased safety awareness. Tawanna Solo

## 2011-06-20 NOTE — Progress Notes (Signed)
Occupational Therapy Note  Patient Details  Name: [name] MRN: [id number] Date of Birth: 1940/09/04 Today's Date: 06/20/2011  Patient missed 60 minutes of skilled occupational therapy secondary to refusal. Upon entering room patient supine in bed. After therapist stated goal of session and OT's role, patient stated "I don't feel like getting out of bed right now, I'm tired". Patient stated he didn't sleep well last night. Encouraged patient to at least get out of bed to sit in recliner or chair, patient refused. Notified RN.    Avir Deruiter 06/20/2011, 11:13 AM

## 2011-06-20 NOTE — Progress Notes (Signed)
ANTICOAGULATION CONSULT NOTE - Follow Up Consult  Pharmacy Consult for coumadin Indication: atrial fibrillation  Vital Signs: Temp: 97.5 F (36.4 C) (03/31 0500) Temp src: Oral (03/31 0500) BP: 106/68 mmHg (03/31 0500) Pulse Rate: 85  (03/31 0500)  Labs:  Basename 06/20/11 0635 06/19/11 0630 06/18/11 0640  HGB -- -- --  HCT -- -- --  PLT -- -- --  APTT -- -- --  LABPROT 22.7* 24.3* 28.9*  INR 1.96* 2.14* 2.67*  HEPARINUNFRC -- -- --  CREATININE -- -- --  CKTOTAL -- -- --  CKMB -- -- --  TROPONINI -- -- --   Estimated Creatinine Clearance: 94.3 ml/min (by C-G formula based on Cr of 0.65).  Assessment: 70 yom s/p stroke with atrial fibrillation requiring initiation of coumadin for further stroke prevention. INR is slightly subtherapeutic today. No bleeding noted.   Goal of Therapy:  INR 2-3   Plan:  1. Coumadin 3mg  PO x 1 tonight 2. F/u AM INR  [name], [name] 06/20/2011,9:35 AM

## 2011-06-20 NOTE — Progress Notes (Signed)
Patient ID: [name], male   DOB: 04/12/1940, 71 y.o.   MRN: [id number]  Subjective/Complaints: Patient denies any complaints. Denies any chest pain, shortness of breath, headache.   Review of Systems  Constitutional: Negative for fever.  Cardiovascular: Negative for chest pain.  Gastrointestinal: Negative for nausea.  Neurological: Negative for headaches.  All other systems reviewed and are negative.  Patient feels well. No specific complaints.  Objective: Vital Signs: Blood pressure 106/68, pulse 85, temperature 97.5 F (36.4 C), temperature source Oral, resp. rate 20, height 6\' 1"  (1.854 m), weight 197 lb 1.5 oz (89.4 kg), SpO2 93.00%. No results found.   Physical Exam  Nursing note and vitals reviewed.  Constitutional: He appears well-developed.  HENT: PERRL, EOMI  Head: Normocephalic.  Neck: Normal range of motion. No thyromegaly present.  Cardiovascular: Normal rate.  Pulmonary/Chest: Breath sounds normal. He has no wheezes.  Abdominal: He exhibits no distension. There is no tenderness.  Musculoskeletal: He exhibits no edema.   Assessment/Plan: 1. Functional deficits secondary to L ACA infarct with RLE paresis which require 3+ hours per day of interdisciplinary therapy in a comprehensive inpatient rehab setting. Physiatrist is providing close team supervision and 24 hour management of active medical problems listed below. Physiatrist and rehab team continue to assess barriers to discharge/monitor patient progress toward functional and medical goals. FIM: FIM - Bathing Bathing Steps Patient Completed: Chest;Right Arm;Left Arm;Abdomen;Front perineal area;Buttocks;Right upper leg;Left upper leg;Right lower leg (including foot);Left lower leg (including foot) Bathing: 4: Steadying assist  FIM - Upper Body Dressing/Undressing Upper body dressing/undressing steps patient completed: Thread/unthread right sleeve of pullover shirt/dresss;Thread/unthread left sleeve of  pullover shirt/dress;Put head through opening of pull over shirt/dress;Pull shirt over trunk Upper body dressing/undressing: 5: Set-up assist to: Obtain clothing/put away FIM - Lower Body Dressing/Undressing Lower body dressing/undressing steps patient completed: Thread/unthread right underwear leg;Thread/unthread left underwear leg;Pull underwear up/down;Don/Doff left sock Lower body dressing/undressing: 4: Min-Patient completed 75 plus % of tasks  FIM - Toileting Toileting steps completed by patient: Adjust clothing prior to toileting Toileting Assistive Devices: Grab bar or rail for support Toileting: 4: Steadying assist  FIM - Diplomatic Services operational officer Devices: Therapist, music Transfers: 3-To toilet/BSC: Mod A (lift or lower assist)  FIM - Banker Devices: Therapist, occupational: 5: Supine > Sit: Supervision (verbal cues/safety issues);5: Sit > Supine: Supervision (verbal cues/safety issues);4: Bed > Chair or W/C: Min A (steadying Pt. > 75%);4: Chair or W/C > Bed: Min A (steadying Pt. > 75%)  FIM - Locomotion: Wheelchair Distance: 150 Locomotion: Wheelchair: 0: Activity did not occur FIM - Locomotion: Ambulation Locomotion: Ambulation Assistive Devices: Designer, industrial/product Ambulation/Gait Assistance: 4: Min assist Locomotion: Ambulation: 4: Travels 150 ft or more with minimal assistance (Pt.>75%)  Comprehension Comprehension Mode: Auditory Comprehension: 3-Understands basic 50 - 74% of the time/requires cueing 25 - 50%  of the time  Expression Expression Mode: Verbal Expression: 4-Expresses basic 75 - 89% of the time/requires cueing 10 - 24% of the time. Needs helper to occlude trach/needs to repeat words.  Social Interaction Social Interaction: 4-Interacts appropriately 75 - 89% of the time - Needs redirection for appropriate language or to initiate interaction.  Problem Solving Problem Solving: 3-Solves basic 50 -  74% of the time/requires cueing 25 - 49% of the time  Memory Memory: 3-Recognizes or recalls 50 - 74% of the time/requires cueing 25 - 49% of the time  Medical Problem List and Plan:  1. left frontal lobe  infarct as well as remote hemorrhagic infarct posterior left insular cortex and superior left temporal lobe  2.. DVT Prophylaxis/Anticoagulation: Chronic Coumadin therapy. Monitor for any signs of bleeding  3. Recurrent seizure. Continue Dilantin and Keppra therapy as per neurology services. Will need followup Dilantin level in next couple days. Monitor for any further seizure activity.  4. Dysphagia. Followup per speech therapy.Pt advanced to D3 diet. Monitor for any signs of aspiration  5. Atrial flutter with RVR. Continue Cardizem and follow up cardiology services. HR controlled at present.  6.  Loose stools improving  7.  Hypokalemia .  Lab Results  Component Value Date   K 3.4* 06/16/2011   8.  Dementia- needs explantion who his therapists are  LOS (Days) 13 A FACE TO FACE EVALUATION WAS PERFORMED  [name] HENRY 06/20/2011, 9:01 AM    Review of Systems  Constitutional: Negative for fever.  Cardiovascular: Negative for chest pain.  Gastrointestinal: Negative for nausea.  Neurological: Negative for headaches.  All other systems reviewed and are negative.

## 2011-06-21 DIAGNOSIS — Z5189 Encounter for other specified aftercare: Secondary | ICD-10-CM

## 2011-06-21 DIAGNOSIS — I6330 Cerebral infarction due to thrombosis of unspecified cerebral artery: Secondary | ICD-10-CM

## 2011-06-21 HISTORY — PX: OTHER SURGICAL HISTORY: SHX169

## 2011-06-21 LAB — BASIC METABOLIC PANEL
Chloride: 100 mEq/L (ref 96–112)
GFR calc Af Amer: >90 mL/min (ref >90)
GFR calc non Af Amer: >90 mL/min (ref >90)
Glucose, Bld: 110 mg/dL — ABNORMAL HIGH (ref 70–99)
Potassium: 3.8 mEq/L (ref 3.5–5.1)
Sodium: 139 mEq/L (ref 135–145)

## 2011-06-21 LAB — PROTIME-INR: Prothrombin Time: 23 seconds — ABNORMAL HIGH (ref 11.6–15.2)

## 2011-06-21 MED ORDER — SIMETHICONE 80 MG PO CHEW
160.0000 mg | CHEWABLE_TABLET | Freq: Four times a day (QID) | ORAL | Status: DC
  Administered 2011-06-21 – 2011-06-29 (×31): 160 mg via ORAL
  Filled 2011-06-21 (×40): qty 2

## 2011-06-21 MED ORDER — WARFARIN SODIUM 3 MG PO TABS
3.0000 mg | ORAL_TABLET | Freq: Once | ORAL | Status: AC
  Administered 2011-06-21: 3 mg via ORAL
  Filled 2011-06-21: qty 1

## 2011-06-21 NOTE — Progress Notes (Addendum)
Occupational Therapy Session Note  Patient Details  Name: [name] MRN: [id number] Date of Birth: 05-03-1940  Today's Date: 06/21/2011 Time: 1130-1200 In diners club for 1hr Time Calculation (min): 30 min  No c/o pain  Diner's club: Focus on cognition, social interaction, recall of therapies for the day, simple problem solving, alternating attention during meal group.    Therapy/Group: Group Therapy  Roney Mans [facility] 06/21/2011, 12:35 PM

## 2011-06-21 NOTE — Progress Notes (Signed)
Per State Regulation 482.30 This chart was reviewed for medical necessity with respect to the patient's Admission/Duration of stay. Pt with poor safety awareness, requiring frequent cues and close supervision during activities. Pt's sister in last week to observe therapies.  [name]                 Nurse Care Manager            Next Review Date: 06/24/11

## 2011-06-21 NOTE — Progress Notes (Signed)
Patient ID: [name], male   DOB: [birth year], 71 y.o.   MRN: [id number]  Subjective/Complaints:  C/o gas, cannot tell me about stools  Objective: Vital Signs: Blood pressure 116/70, pulse 86, temperature 98.8 F (37.1 C), temperature source Oral, resp. rate 18, height 6\' 1"  (1.854 m), weight 89.4 kg (197 lb 1.5 oz), SpO2 97.00%. No results found. Results for orders placed during the hospital encounter of 06/07/11 (from the past 72 hour(s))  ALBUMIN     Status: Abnormal   Collection Time   06/18/11 11:30 AM      Component Value Range Comment   Albumin 3.4 (*) 3.5 - 5.2 (g/dL)   PROTIME-INR     Status: Abnormal   Collection Time   06/19/11  6:30 AM      Component Value Range Comment   Prothrombin Time 24.3 (*) 11.6 - 15.2 (seconds)    INR 2.14 (*) 0.00 - 1.49    PROTIME-INR     Status: Abnormal   Collection Time   06/20/11  6:35 AM      Component Value Range Comment   Prothrombin Time 22.7 (*) 11.6 - 15.2 (seconds)    INR 1.96 (*) 0.00 - 1.49    PROTIME-INR     Status: Abnormal   Collection Time   06/21/11  7:25 AM      Component Value Range Comment   Prothrombin Time 23.0 (*) 11.6 - 15.2 (seconds)    INR 2.00 (*) 0.00 - 1.49      Physical Exam  Nursing note and vitals reviewed.  Constitutional: He appears well-developed.  HENT: PERRL, EOMI  Head: Normocephalic.  Neck: Normal range of motion. No thyromegaly present.  Cardiovascular: Normal rate.  Pulmonary/Chest: Breath sounds normal. He has no wheezes.  Abdominal: He exhibits no distension. There is no tenderness.  Musculoskeletal: He exhibits no edema.  RLE with 2+ edema at ankle Neurological: He is alert.  Pt alert. Follows simple commnds, needing cueing at times. Orientation to person, place not time Right facial droop and tongue deviation. Speech dysarthric but Intelligible. . Patient is oriented x3 with good recall. e.Light touch intact RUE 4/5. RLE 3-/5. Mild right pronator drift and decreased FMC on right.  DTR's 3+ on right. Toes up.  Skin: Skin is warm and dry   Assessment/Plan: 1. Functional deficits secondary to L ACA infarct with RLE paresis which require 3+ hours per day of interdisciplinary therapy in a comprehensive inpatient rehab setting. Physiatrist is providing close team supervision and 24 hour management of active medical problems listed below. Physiatrist and rehab team continue to assess barriers to discharge/monitor patient progress toward functional and medical goals. FIM: FIM - Bathing Bathing Steps Patient Completed: Chest;Right Arm;Left Arm;Abdomen;Front perineal area;Buttocks;Right upper leg;Left upper leg;Right lower leg (including foot);Left lower leg (including foot) Bathing: 4: Steadying assist  FIM - Upper Body Dressing/Undressing Upper body dressing/undressing steps patient completed: Thread/unthread right sleeve of pullover shirt/dresss;Thread/unthread left sleeve of pullover shirt/dress;Put head through opening of pull over shirt/dress;Pull shirt over trunk Upper body dressing/undressing: 5: Set-up assist to: Obtain clothing/put away FIM - Lower Body Dressing/Undressing Lower body dressing/undressing steps patient completed: Thread/unthread right underwear leg;Thread/unthread left underwear leg;Pull underwear up/down;Don/Doff left sock Lower body dressing/undressing: 4: Min-Patient completed 75 plus % of tasks  FIM - Toileting Toileting steps completed by patient: Adjust clothing prior to toileting Toileting Assistive Devices: Grab bar or rail for support Toileting: 4: Steadying assist  FIM - Diplomatic Services operational officer Devices: Therapist, music  Transfers: 3-From toilet/BSC: Mod A (lift or lower assist)  FIM - Bed/Chair Transfer Bed/Chair Transfer Assistive Devices: Therapist, occupational: 5: Supine > Sit: Supervision (verbal cues/safety issues);5: Sit > Supine: Supervision (verbal cues/safety issues);4: Bed > Chair or W/C: Min A (steadying  Pt. > 75%);4: Chair or W/C > Bed: Min A (steadying Pt. > 75%)  FIM - Locomotion: Wheelchair Distance: 150 Locomotion: Wheelchair: 0: Activity did not occur FIM - Locomotion: Ambulation Locomotion: Ambulation Assistive Devices: Designer, industrial/product Ambulation/Gait Assistance: 4: Min assist Locomotion: Ambulation: 4: Travels 150 ft or more with minimal assistance (Pt.>75%)  Comprehension Comprehension Mode: Auditory Comprehension: 3-Understands basic 50 - 74% of the time/requires cueing 25 - 50%  of the time  Expression Expression Mode: Verbal Expression: 4-Expresses basic 75 - 89% of the time/requires cueing 10 - 24% of the time. Needs helper to occlude trach/needs to repeat words.  Social Interaction Social Interaction: 4-Interacts appropriately 75 - 89% of the time - Needs redirection for appropriate language or to initiate interaction.  Problem Solving Problem Solving: 3-Solves basic 50 - 74% of the time/requires cueing 25 - 49% of the time  Memory Memory: 3-Recognizes or recalls 50 - 74% of the time/requires cueing 25 - 49% of the time  Medical Problem List and Plan:  1. 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 DVT Prophylaxis/Anticoagulation: Chronic Coumadin therapy. Monitor for any signs of bleeding  3. Recurrent seizure. Continue Dilantin and Keppra therapy as per neurology services. Will need followup Dilantin level in next couple days. Monitor for any further seizure activity.  4. Dysphagia. Followup per speech therapy.Pt advanced to D3 diet. Monitor for any signs of aspiration  5. Atrial flutter with RVR. Continue Cardizem and follow up cardiology services. HR controlled at present.  6.  Loose improved c/o gas add mylicon 7.  Hypokalemia plus pedal edema reduce KCL and start aldactone, BMET in am 8.  Dementia- needs explantion who his therapists are  LOS (Days) 14 A FACE TO FACE  EVALUATION WAS PERFORMED  [name] E 06/21/2011, 8:27 AM    Review of Systems  Cardiovascular: Positive for leg swelling. Negative for chest pain.  Gastrointestinal: Negative for nausea, vomiting, abdominal pain and diarrhea.  Neurological: Positive for focal weakness.  All other systems reviewed and are negative.

## 2011-06-21 NOTE — Progress Notes (Signed)
Speech Pathology: Dysphagia Treatment Note  Group Session  1200-1230  Patient was observed with : Dys.3 soft solids and Thin liquids.  Patient was noted to have s/s of aspiration : Yes, throat clear x1  Lung Sounds:  WNL Temperature: WNL  Patient required: supervision   Clinical Impression: SLP facilitated session with supervision level semantic cues x1 to consistently follow precautions/strategies of utilizing double swallows especially with liquids which resulted in throat clears and then patient required cues to use double swallows to clear.    Recommendations:  Continue with plan of care  Pain:   none Intervention Required:   No  Goals: Progressing  Fae Pippin, M.A., CCC-SLP [phone]

## 2011-06-21 NOTE — Progress Notes (Signed)
Physical Therapy Weekly Progress Note  Patient Details  Name: [name]  MRN: [id number]  Date of Birth: 1940-05-11  Today's Date: 06/21/2011  Time: 4270-6237  Time Calculation (min): 70 min   Patient is making steady progress toward LTG; patient has met all short term goals but has met 0 of 7 long term goals which are set at supervision overall.  Patient is currently supervision for bed mobility, min A overall for bed <> chair transfers, gait in controlled environment with RW and stair negotiation.  Patient continues to require frequent verbal, tactile and visual cues for sequencing and safety during functional mobility.    Patient continues to demonstrate the following deficits: R sided weakness, impaired safety awareness, awareness of impairments, impaired motor control and planning/sequencing, impaired standing dynamic balance and gait and therefore will continue to benefit from skilled PT intervention to enhance overall performance with balance, ability to compensate for deficits, functional use of  right upper extremity and right lower extremity, attention, awareness and coordination.  Patient to D/C next week to daughter's home with supervision and assistance.  Patient's daughter has been present to observe therapies but hands on education still to be completed.  Patient progressing toward long term goals..  Continue plan of care.  PT Short Term Goals Week 1:  PT Short Term Goal 1 (Week 1): Pt will be able to complete bed mobility with supervision (with adaptive equipment if needed) PT Short Term Goal 1 - Progress (Week 1): Met PT Short Term Goal 2 (Week 1): Pt will be able to maintain dynamic standing balance during functional task with min A PT Short Term Goal 2 - Progress (Week 1): Met PT Short Term Goal 3 (Week 1): Pt will be able to demonstrate functional transfers with min A PT Short Term Goal 3 - Progress (Week 1): Met PT Short Term Goal 4 (Week 1): Pt will be able to gait  x 50' with min A PT Short Term Goal 4 - Progress (Week 1): Met Week 2:  PT Short Term Goal 1 (Week 2): = LTG: supervision overall   Skilled Therapeutic Interventions/Progress Updates:   Therapy Documentation  Precautions:  Precautions  Precautions: Fall  Precaution Comments: Dys. 3 textures and thin liquids; significant RLE weakness  Required Braces or Orthoses: No  Restrictions  Weight Bearing Restrictions: No   Pain:No complaint of pain  Mobility: Sit to supine (mat) SBA with difficulty RT LE/supine to side to sit (mat) min to SBA with difficulty with vcs for technique   Locomotion :Gait 160 feet RW X 3 min to close supervision with vcs to increase erect standing and facilitate heel/toe gait  /up-down 4 steps 1 rail min assist with vcs for technique step to step; gait without AD min assist with pts hands on therapists shoulders 60 feet .; stepping forward/backward without UE support 5 steps mod assist for balance with one loss of balance .   Balance: Toe tapping LT LE on 4 inch step with improving weight bearing RT LE/requires min assist to maintain balance; toe tapping RT LE on 4 inch step with decreased hip flexion stepping down from step; static standing -pt can stand with close supervision statically   Exercises:Nustep Level 8 RT LE/Lt UE only for RT LE strengthening; Neuro re-ed RT LE including prone knee flexion (approximately 75% range actively) ; AA prone hip extension (pt has difficulty initiating gluts in this position); sidelying AA hip abduction; supine hip flexion full range actively with decreased eccentric control  Other Treatments: Sit to stand with hands on knees X 10 SBA  See FIM for current functional status  Therapy/Group: Individual Therapy  HOUT,JIM  06/21/2011, 9:42 AM         See FIM for current functional status  Therapy/Group: Individual Therapy  Edman Circle [facility] 06/21/2011, 12:35 PM

## 2011-06-21 NOTE — Progress Notes (Signed)
ANTICOAGULATION CONSULT NOTE - Follow Up Consult  Pharmacy Consult for coumadin Indication: atrial fibrillation  Vital Signs: Temp: 98.8 F (37.1 C) (04/01 0500) Temp src: Oral (04/01 0500) BP: 116/70 mmHg (04/01 0500) Pulse Rate: 86  (04/01 0500)  Labs:  Basename 06/21/11 0725 06/20/11 0635 06/19/11 0630  HGB -- -- --  HCT -- -- --  PLT -- -- --  APTT -- -- --  LABPROT 23.0* 22.7* 24.3*  INR 2.00* 1.96* 2.14*  HEPARINUNFRC -- -- --  CREATININE -- -- --  CKTOTAL -- -- --  CKMB -- -- --  TROPONINI -- -- --   Estimated Creatinine Clearance: 94.3 ml/min (by C-G formula based on Cr of 0.65).  Assessment: 58 yom s/p stroke with atrial fibrillation requiring initiation of coumadin for further stroke prevention. INR is therapeutic. No bleeding noted. No CBC available.   Goal of Therapy:  INR 2-3   Plan:  1. Coumadin 3mg  PO x 1 tonight 2. F/u AM INR  [name], [name] 06/21/2011,8:36 AM

## 2011-06-21 NOTE — Progress Notes (Signed)
Speech Language Pathology Daily Session Note  Patient Details  Name: [name] MRN: [id number] Date of Birth: 1940-12-22  Today's Date: 06/21/2011 Time: 1400-1500 Time Calculation (min): 60 min  Short Term Goals: Week 2: SLP Short Term Goal 1 (Week 2): Patient will consume Dys.3 textures and thin liquids without obsevred s/s of aspiration with supervision semantic cues to utilize compensatory strategies SLP Short Term Goal 2 (Week 2): Patient will consume trials of regular textures with moderate assist semantic cues SLP Short Term Goal 3 (Week 2): Patient will request help as needed with minimal assist sematnic cues SLP Short Term Goal 4 (Week 2): Patient will follow 2 step directions with minimal assist semantic and visual cues SLP Short Term Goal 5 (Week 2): Patient will verbally express basic needs/wants with minimal assist semantic cues  Skilled Therapeutic Interventions: SLP facilitated session with moderate assist semantic cues to recall and utilize transfer procedures; moderate assist semantic cues to verbally express similarities and differences between objects and max assist semantic cues to recall medication names, functions and frequencies.  SLP provided patient with list to reinforce medication management and plan to complete a task with loading a medication organizer tomorrow.   Daily Session Precautions/Restrictions    FIM:  Comprehension Comprehension Mode: Auditory Comprehension: 3-Understands basic 50 - 74% of the time/requires cueing 25 - 50%  of the time Expression Expression Mode: Verbal Expression: 4-Expresses basic 75 - 89% of the time/requires cueing 10 - 24% of the time. Needs helper to occlude trach/needs to repeat words. Social Interaction Social Interaction: 4-Interacts appropriately 75 - 89% of the time - Needs redirection for appropriate language or to initiate interaction. Problem Solving Problem Solving: 3-Solves basic 50 - 74% of the  time/requires cueing 25 - 49% of the time Memory Memory: 3-Recognizes or recalls 50 - 74% of the time/requires cueing 25 - 49% of the time FIM - Eating Eating Activity: 5: Supervision/cues General    Pain Pain Assessment Pain Assessment: No/denies pain Pain Score: 0-No pain  Therapy/Group: Individual Therapy  Charlane Ferretti., CCC-SLP [phone]  Tylin Stradley 06/21/2011, 4:58 PM

## 2011-06-21 NOTE — Progress Notes (Signed)
Occupational Therapy Session Note  Patient Details  Name: [name] MRN: [id number] Date of Birth: [birth year]  Today's Date: 06/21/2011 Time: 1015-1110 Time Calculation (min): 55 min  Short Term Goals: Week 2:  OT Short Term Goal 1 (Week 2): Pt will complete LB dressing with min assist at sit to stand level OT Short Term Goal 2 (Week 2): Pt will complete toilet transfer with min assist stand pivot OT Short Term Goal 3 (Week 2): Pt will complete grooming in standing at sink with min assist.  Skilled Therapeutic Interventions/Progress Updates:    Pt seen for ADL retraining with focus on safety awareness, transfers with use of RW, and safety with bathing at sit to stand level in walk-in shower.  Pt's daughter present for session, mostly sitting in corner of room working at her computer.  Pt requested that daughter not be present for bathing, educated pt on the need to have someone in the bathroom with him for safety and supervision/cues due to his RLE weakness and STM deficits.  Pt overall supervision/cues with bathing this session requiring cues to initiate weight bearing through RLE prior to sit to stand and taking it slow with turns on the wet floor.  Functional ambulation with RW with light min assist and min verbal cues for RW placement with ambulation.  Daughter followed with ambulation.  Educated pt and daughter on proper sit <> stand technique and hand placement, when and what cues to provide to increase his safety and independence, and the need for supervision at this time secondary to [facility] deficits, decreased safety awareness, and RLE weakness/sensation deficits.  Daughter reports that pt is hardheaded but that she will be able to provide supervision and occasional min assist as needed for him.  Encouraged her to have more hands on with him during therapy sessions.  Therapy Documentation Precautions:  Precautions Precautions: Fall Precaution Comments: Dys. 3 textures and thin  liquids; significant RLE weakness Required Braces or Orthoses: No Restrictions Weight Bearing Restrictions: No General:   Vital Signs: Therapy Vitals BP: 128/68 mmHg Pain:   No c/o pain  See FIM for current functional status  Therapy/Group: Individual Therapy  [name] 06/21/2011, 12:06 PM

## 2011-06-22 DIAGNOSIS — Z5189 Encounter for other specified aftercare: Secondary | ICD-10-CM

## 2011-06-22 DIAGNOSIS — I6330 Cerebral infarction due to thrombosis of unspecified cerebral artery: Secondary | ICD-10-CM

## 2011-06-22 LAB — PROTIME-INR: INR: 1.84 — ABNORMAL HIGH (ref 0.00–1.49)

## 2011-06-22 MED ORDER — PANTOPRAZOLE SODIUM 40 MG PO TBEC
40.0000 mg | DELAYED_RELEASE_TABLET | Freq: Every day | ORAL | Status: DC
  Administered 2011-06-22 – 2011-06-28 (×6): 40 mg via ORAL
  Filled 2011-06-22 (×7): qty 1

## 2011-06-22 MED ORDER — WARFARIN SODIUM 5 MG PO TABS
5.0000 mg | ORAL_TABLET | Freq: Once | ORAL | Status: AC
  Administered 2011-06-22: 5 mg via ORAL
  Filled 2011-06-22 (×2): qty 1

## 2011-06-22 NOTE — Progress Notes (Signed)
Occupational Therapy Weekly Progress Note and Session Note  Patient Details  Name: [name] MRN: [id number] Date of Birth: 1940-06-04  Today's Date: 06/22/2011 Time: 1015-1110 Time Calculation (min): 55 min  Patient has met 3 of 3 short term goals.  Pt continues to require assistance with LB dressing to don Rt sock secondary to swelling in Rt foot.  Pt requires close supervision with bathing secondary to STM deficits, impulsivity, and RLE weakness and inattention.  Pt is very strong willed and prefers to do things independently, however continues to require mod verbal cues for safety with all functional mobility.  Pt will requires 24/7 supervision upon d/c secondary to his decreased cognition, STM, safety awareness and impulsivity.    Patient continues to demonstrate the following deficits: RLE weakness, impulsivity, decreased safety awareness and therefore will continue to benefit from skilled OT intervention to enhance overall performance with BADL.  Patient progressing toward long term goals..  Continue plan of care.  OT Short Term Goals Week 2:  OT Short Term Goal 1 (Week 2): Pt will complete LB dressing with min assist at sit to stand level OT Short Term Goal 1 - Progress (Week 2): Met OT Short Term Goal 2 (Week 2): Pt will complete toilet transfer with min assist stand pivot OT Short Term Goal 2 - Progress (Week 2): Met OT Short Term Goal 3 (Week 2): Pt will complete grooming in standing at sink with min assist. OT Short Term Goal 3 - Progress (Week 2): Met Week 3:  OT Short Term Goal 1 (Week 3): STG = LTG due to remaining LOS  Skilled Therapeutic Interventions/Progress Updates:    Pt seen for ADL retraining at walk-in shower level with focus on safety with transfers to and from with RW due to slipper floor and decreased awareness and sensation in RLE. Pt continues to request that this therapist step out of the room while he is bathing; continue to educate the pt on having  someone with him during functional mobility for safety and supervision/cues due to his RLE weakness and STM deficits.  Pt completed bathing 75% in standing with close supervision due to high fall risk and past fall.    Therapy Documentation Precautions:  Precautions Precautions: Fall Precaution Comments: Dys. 3 textures and thin liquids; significant RLE weakness Required Braces or Orthoses: No Restrictions Weight Bearing Restrictions: No General:   Vital Signs:   Pain: Pain Assessment Pain Assessment: No/denies pain Pain Score: 0-No pain ADL: ADL Eating: Set up Grooming: Modified independent Where Assessed-Grooming: Sitting at sink Upper Body Bathing: Supervision/safety Where Assessed-Upper Body Bathing: Shower Lower Body Bathing: Supervision/safety Where Assessed-Lower Body Bathing: Shower Upper Body Dressing: Setup Where Assessed-Upper Body Dressing: Chair Lower Body Dressing: Minimal assistance Where Assessed-Lower Body Dressing: Chair Toileting: Contact guard Where Assessed-Toileting: Toilet;Bedside Commode (BSC over toilet) Toilet Transfer: Minimal assistance Toilet Transfer Method: Ambulating (with RW) Toilet Transfer Equipment: Bedside commode;Grab bars Walk-In Shower Transfer: Minimal assistance;Minimal cueing Film/video editor Method: Ambulating (with RW) Astronomer: Transfer tub bench;Grab bars ADL Comments: Pt continues to require increased cues for safety secondary to impulsiveness and decreased STM.  Pt reqiures verbal cues for RLE placement and to lock w/c brakes prior to sit to stand.  See FIM for current functional status  Therapy/Group: Individual Therapy  [name] 06/22/2011, 12:19 PM

## 2011-06-22 NOTE — Progress Notes (Addendum)
Physical Therapy Session Note  Patient Details  Name: [name] MRN: [id number] Date of Birth: 06/24/1940  Today's Date: 06/22/2011 Time: 0855-1000 Time Calculation (min): 65 min  Short Term Goals: Week 2:  PT Short Term Goal 1 (Week 2): = LTG: supervision overall      Skilled Therapeutic Interventions/Progress Updates: Neuromuscular re-education R trunk and LE via forced use, demo, verbal and tactile cues.  Wt shifting in sitting performed with assistance for trunk shortening/lengthening, working on head righting.  Car transfer x 2 with min assist for safety and appropriate techniques.  Dynamic standing balance for L and R toe tapping onto 8" high stool, with min assist for occasional LOB to R and backwards; pt activated R hip and knee flexors sufficiently to clear edge of stool 9/10 trials.  For trunk flexibility, pt leaned laterally and reached out of BOS to L and R x 10 each, in sitting, without LOB. In standing, retrieval of large cones without use of AD, with min assist, without LOB.  Co-treatment with recreational therapist for high level gait, transporting items while walking without AD, while performing plant care at counter height.  Pt side stepped L and R during functional tasks with 1 LOB due to narrow BOS, poor placement of R foot, requiring max assist to recover balance and prevent fall. Gait training without AD x 25' x 2 while carrying a small watering can, with close supervision.  Posture noted below. Pt exhibited Trendelenburg due to R hip weakness.     Therapy Documentation Precautions:  Precautions Precautions: Fall Precaution Comments: Dys. 3 textures and thin liquids; significant RLE weakness Required Braces or Orthoses: No Restrictions Weight Bearing Restrictions: No    Pain: Pain Assessment Pain Assessment: No/denies pain Pain Score: 0-No pain    Locomotion : Ambulation Ambulation/Gait Assistance: 4: Min assist  Trunk/Postural Assessment : Pt  demonstrates poor activation of trunk muscles for shortening/lengthening needed for head righting, in sitting and standing; gait without AD includes marked forward and L lean, self- limiting wt shift to R.        See FIM for current functional status  Therapy/Group: Individual Therapy and Co-Treatment  Reilly Molchan 06/22/2011, 12:30 PM

## 2011-06-22 NOTE — Progress Notes (Signed)
Speech Language Pathology Daily Session Note  Patient Details  Name: [name] MRN: [id number] Date of Birth: April 08, 1940  Today's Date: 06/22/2011 Time: 1400-1435 Time Calculation (min): 35 min  Short Term Goals:  SLP Short Term Goal 1 (Week 2): Patient will consume Dys.3 textures and thin liquids without obsevred s/s of aspiration with supervision semantic cues to utilize compensatory strategies SLP Short Term Goal 2 (Week 2): Patient will consume trials of regular textures with moderate assist semantic cues SLP Short Term Goal 3 (Week 2): Patient will request help as needed with minimal assist sematnic cues SLP Short Term Goal 4 (Week 2): Patient will follow 2 step directions with minimal assist semantic and visual cues SLP Short Term Goal 5 (Week 2): Patient will verbally express basic needs/wants with minimal assist semantic cues  Skilled Therapeutic Interventions: Treatment focus on emergent awareness into deficits. Clinician administered a planned failure with a functional and familiar task of medication management with use of a pill box and medications. Pt required total A multimodal cueing for mental flexibility in regards to new medications vs medications he took at home and for problem solving utilizing a pill box for a medication he must take multiple times a day. Planned failure successful in increasing pt's emergent awareness of deficits with pt reporting, "this is more complicated than I thought it would be."   Daily Session Precautions/Restrictions  Precautions Precautions: Fall FIM:  Comprehension Comprehension Mode: Auditory Comprehension: 3-Understands basic 50 - 74% of the time/requires cueing 25 - 50%  of the time Expression Expression Mode: Verbal Expression: 3-Expresses basic 50 - 74% of the time/requires cueing 25 - 50% of the time. Needs to repeat parts of sentences. Social Interaction Social Interaction: 4-Interacts appropriately 75 - 89% of the time -  Needs redirection for appropriate language or to initiate interaction. Problem Solving Problem Solving: 2-Solves basic 25 - 49% of the time - needs direction more than half the time to initiate, plan or complete simple activities Memory Memory: 2-Recognizes or recalls 25 - 49% of the time/requires cueing 51 - 75% of the time FIM - Eating Eating Activity: 5: Supervision/cues Pain Pain Assessment Pain Assessment: No/denies pain Pain Score: 0-No pain  Therapy/Group: Individual Therapy  Alyssamarie Mounsey 06/22/2011, 3:38 PM

## 2011-06-22 NOTE — Progress Notes (Signed)
Nutrition Follow-up  Intake variable, on average eating 75% of meals. Denies any specific questions at this time, intake slowly improving.  Diet Order:  Dysphagia 3 with thin liquids  Meds: Scheduled Meds:   . diltiazem  120 mg Oral Daily  . levETIRAcetam  1,750 mg Oral BID  . pantoprazole  40 mg Oral Q1200  . phenytoin  134 mg Oral Q8H  . potassium chloride  10 mEq Oral BID  . simethicone  160 mg Oral QID  . spironolactone  50 mg Oral Daily  . warfarin  3 mg Oral ONCE-1800  . warfarin  5 mg Oral ONCE-1800  . Warfarin - Pharmacist Dosing Inpatient   Does not apply q1800   Continuous Infusions:  PRN Meds:.acetaminophen, alum & mag hydroxide-simeth, diphenoxylate-atropine, ondansetron (ZOFRAN) IV, ondansetron  Labs:  CMP     Component Value Date/Time   NA 139 06/21/2011 0928   K 3.8 06/21/2011 0928   CL 100 06/21/2011 0928   CO2 26 06/21/2011 0928   GLUCOSE 110* 06/21/2011 0928   BUN 10 06/21/2011 0928   CREATININE 0.69 06/21/2011 0928   CALCIUM 9.4 06/21/2011 0928   PROT 5.5* 06/08/2011 0650   ALBUMIN 3.4* 06/18/2011 1130   AST 47* 06/08/2011 0650   ALT 46 06/08/2011 0650   ALKPHOS 69 06/08/2011 0650   BILITOT 0.4 06/08/2011 0650   GFRNONAA >90 06/21/2011 0928   GFRAA >90 06/21/2011 0928     Intake/Output Summary (Last 24 hours) at 06/22/11 0935 Last data filed at 06/22/11 0015  Gross per 24 hour  Intake    600 ml  Output    275 ml  Net    325 ml    Weight Status:  89.4 kg, wt up 3.3 kg x 1 week - noted edema in most recent physician's note  Estimated needs:  1700 - 1800 kcal, 75 - 85 grams protein  Nutrition Dx:  none  Goal:  Pt to consume >/= 75% of meals on average. Met.  Intervention:  Continue current interventions.  Monitor:  Weights, labs, PO intake, I/O's  Adair Laundry Pager #:  [phone]

## 2011-06-22 NOTE — Progress Notes (Signed)
Patient ID: [name], male   DOB: June 28, 1940, 71 y.o.   MRN: [id number] Patient ID: [name], male   DOB: 10-Jul-1940, 71 y.o.   MRN: [id number]  Subjective/Complaints:  C/o gas, had bm last night 4/2  Objective: Vital Signs: Blood pressure 104/64, pulse 89, temperature 99 F (37.2 C), temperature source Oral, resp. rate 20, height 6\' 1"  (1.854 m), weight 89.4 kg (197 lb 1.5 oz), SpO2 93.00%. No results found. Results for orders placed during the hospital encounter of 06/07/11 (from the past 72 hour(s))  PROTIME-INR     Status: Abnormal   Collection Time   06/20/11  6:35 AM      Component Value Range Comment   Prothrombin Time 22.7 (*) 11.6 - 15.2 (seconds)    INR 1.96 (*) 0.00 - 1.49    PROTIME-INR     Status: Abnormal   Collection Time   06/21/11  7:25 AM      Component Value Range Comment   Prothrombin Time 23.0 (*) 11.6 - 15.2 (seconds)    INR 2.00 (*) 0.00 - 1.49    BASIC METABOLIC PANEL     Status: Abnormal   Collection Time   06/21/11  9:28 AM      Component Value Range Comment   Sodium 139  135 - 145 (mEq/L)    Potassium 3.8  3.5 - 5.1 (mEq/L)    Chloride 100  96 - 112 (mEq/L)    CO2 26  19 - 32 (mEq/L)    Glucose, Bld 110 (*) 70 - 99 (mg/dL)    Dominguez 10  6 - 23 (mg/dL)    Creatinine, Ser 5.62  0.50 - 1.35 (mg/dL)    Calcium 9.4  8.4 - 10.5 (mg/dL)    GFR calc non Af Amer >90  >90 (mL/min)    GFR calc Af Amer >90  >90 (mL/min)   PROTIME-INR     Status: Abnormal   Collection Time   06/22/11  6:35 AM      Component Value Range Comment   Prothrombin Time 21.6 (*) 11.6 - 15.2 (seconds)    INR 1.84 (*) 0.00 - 1.49      Physical Exam  Nursing note and vitals reviewed.  Constitutional: He appears well-developed.  HENT: PERRL, EOMI  Head: Normocephalic.  Neck: Normal range of motion. No thyromegaly present.  Cardiovascular: Normal rate.  Pulmonary/Chest: Breath sounds normal. He has no wheezes.  Abdominal: He exhibits no distension. There is no  tenderness. BS are hyperactive Musculoskeletal: He exhibits no edema.  RLE with 2+ edema at ankle Neurological: He is alert.  Pt alert. Follows simple commnds, needing cueing at times. Orientation to person, place not time Right facial droop and tongue deviation. Speech dysarthric but Intelligible. . Patient is oriented x3 with good recall. e.Light touch intact RUE 4/5. RLE 3-/5. Mild right pronator drift and decreased FMC on right. DTR's 3+ on right. Toes up. Word finding deficits Skin: Skin is warm and dry   Assessment/Plan: 1. Functional deficits secondary to L ACA infarct with RLE paresis which require 3+ hours per day of interdisciplinary therapy in a comprehensive inpatient rehab setting. Physiatrist is providing close team supervision and 24 hour management of active medical problems listed below. Physiatrist and rehab team continue to assess barriers to discharge/monitor patient progress toward functional and medical goals. FIM: FIM - Bathing Bathing Steps Patient Completed: Chest;Right Arm;Left Arm;Abdomen;Front perineal area;Buttocks;Right upper leg;Left upper leg;Right lower leg (including foot);Left lower leg (including foot) Bathing:  5: Supervision: Safety issues/verbal cues  FIM - Upper Body Dressing/Undressing Upper body dressing/undressing steps patient completed: Thread/unthread right sleeve of pullover shirt/dresss;Thread/unthread left sleeve of pullover shirt/dress;Put head through opening of pull over shirt/dress;Pull shirt over trunk Upper body dressing/undressing: 5: Set-up assist to: Obtain clothing/put away FIM - Lower Body Dressing/Undressing Lower body dressing/undressing steps patient completed: Thread/unthread right underwear leg;Thread/unthread left underwear leg;Pull underwear up/down;Thread/unthread right pants leg;Thread/unthread left pants leg;Pull pants up/down;Don/Doff left sock Lower body dressing/undressing: 4: Min-Patient completed 75 plus % of tasks  FIM  - Toileting Toileting steps completed by patient: Adjust clothing prior to toileting;Performs perineal hygiene;Adjust clothing after toileting Toileting Assistive Devices: Grab bar or rail for support Toileting: 4: Steadying assist  FIM - Diplomatic Services operational officer Devices: Bedside commode;Grab bars;Walker (BSC over toilet) Toilet Transfers: 4-To toilet/BSC: Min A (steadying Pt. > 75%);4-From toilet/BSC: Min A (steadying Pt. > 75%)  FIM - Bed/Chair Transfer Bed/Chair Transfer Assistive Devices: Therapist, occupational: 5: Supine > Sit: Supervision (verbal cues/safety issues);5: Sit > Supine: Supervision (verbal cues/safety issues);4: Bed > Chair or W/C: Min A (steadying Pt. > 75%);4: Chair or W/C > Bed: Min A (steadying Pt. > 75%)  FIM - Locomotion: Wheelchair Distance: 150 Locomotion: Wheelchair: 0: Activity did not occur FIM - Locomotion: Ambulation Locomotion: Ambulation Assistive Devices: Designer, industrial/product Ambulation/Gait Assistance: 4: Min assist Locomotion: Ambulation: 4: Travels 150 ft or more with minimal assistance (Pt.>75%)  Comprehension Comprehension Mode: Auditory Comprehension: 3-Understands basic 50 - 74% of the time/requires cueing 25 - 50%  of the time  Expression Expression Mode: Verbal Expression: 4-Expresses basic 75 - 89% of the time/requires cueing 10 - 24% of the time. Needs helper to occlude trach/needs to repeat words.  Social Interaction Social Interaction: 4-Interacts appropriately 75 - 89% of the time - Needs redirection for appropriate language or to initiate interaction.  Problem Solving Problem Solving: 3-Solves basic 50 - 74% of the time/requires cueing 25 - 49% of the time  Memory Memory: 3-Recognizes or recalls 50 - 74% of the time/requires cueing 25 - 49% of the time  Medical Problem List and Plan:  1. left frontal lobe infarct as well as remote hemorrhagic infarct posterior left insular cortex and superior left temporal lobe   2.. DVT Prophylaxis/Anticoagulation: Chronic Coumadin therapy. Monitor for any signs of bleeding  3. Recurrent seizure. Continue Dilantin and Keppra therapy as per neurology services. Will need followup Dilantin level in next couple days. Monitor for any further seizure activity.  4. Dysphagia. Followup per speech therapy.Pt advanced to D3 diet. Monitor for any signs of aspiration  5. Atrial flutter with RVR. Continue Cardizem and follow up cardiology services. HR controlled at present.  6.  Loose stool:  improved c/o gas add mylicon. PPI 7.  Hypokalemia plus pedal edema reduce KCL and start aldactone, BMET in am 8.  Dementia- needs explantion who his therapists are  LOS (Days) 15 A FACE TO FACE EVALUATION WAS PERFORMED  Reggie Bise T 06/22/2011, 8:02 AM    Review of Systems  Cardiovascular: Positive for leg swelling. Negative for chest pain.  Gastrointestinal: Negative for nausea, vomiting, abdominal pain and diarrhea.  Neurological: Positive for focal weakness.  All other systems reviewed and are negative.

## 2011-06-22 NOTE — Progress Notes (Signed)
Occupational Therapy Session Note  Patient Details  Name: LUKASZ ROGUS MRN: [id number] Date of Birth: 30-May-1940  Today's Date: 06/22/2011 Time: 1130-1200 Time Calculation (min): 30 min   Skilled Therapeutic Interventions/Progress Updates:    Pt engaged in self feeding group with focus on BUE use for opening containers and social interaction.  Pt using BUE independently for all functional tasks.  Attempts to engage pt in conversation resulted in limited response.   Therapy Documentation Precautions:  Precautions Precautions: Fall Precaution Comments: Dys. 3 textures and thin liquids; significant RLE weakness Required Braces or Orthoses: No Restrictions Weight Bearing Restrictions: No   Pain: Pain Assessment Pain Assessment: No/denies pain Pain Score: 0-No pain  See FIM for current functional status  Therapy/Group: Group Therapy  Rich Brave 06/22/2011, 3:50 PM

## 2011-06-22 NOTE — Progress Notes (Signed)
Speech Pathology: Dysphagia Treatment Note   Group Session  1200-1230   Patient was observed with : Dys.3 soft solids and Thin liquids.   Patient was noted to have s/s of aspiration : Yes, throat clear x3   Lung Sounds: WNL  Temperature: WNL   Patient required: supervision verbal/questioning cues  Clinical Impression:  SLP facilitated session with supervision level verbal cue x 1 to consistently follow precautions/strategies of utilizing double swallows especially with Dys. 3 textures which resulted in throat clear X 3 at end of meal with dry cake.   Recommendations:  Continue with plan of care   Pain: none   Intervention Required: No   Goals:  Progressing  Feliberto Gottron, MA, CCC-SLP

## 2011-06-22 NOTE — Progress Notes (Signed)
Recreational Therapy Session Note  Patient Details  Name: [name] MRN: [id number] Date of Birth: 06/26/1940 Today's Date: 06/22/2011 Time 935-10 Pain: no c/o Skilled Therapeutic Interventions/Progress Updates: Horticultural activity standing using BUE's to water and move plants on counter.  Pt ambulating without AD while transporting items and side stepping.  Pt experienced LOB x1 needing Max assist to recover.   Therapy/Group: Co-Treatment  Activity Level: Moderate:  Level of assist: Min Assist  Anessia Oakland 06/22/2011, 12:42 PM

## 2011-06-22 NOTE — Progress Notes (Signed)
ANTICOAGULATION CONSULT NOTE - Follow Up Consult  Pharmacy Consult for coumadin Indication: atrial fibrillation  Vital Signs: Temp: 99 F (37.2 C) (04/02 0611) BP: 104/64 mmHg (04/02 0611) Pulse Rate: 89  (04/02 0611)  Labs:  Alvira Philips 06/22/11 9604 06/21/11 5409 06/21/11 0725 06/20/11 0635  HGB -- -- -- --  HCT -- -- -- --  PLT -- -- -- --  APTT -- -- -- --  LABPROT 21.6* -- 23.0* 22.7*  INR 1.84* -- 2.00* 1.96*  HEPARINUNFRC -- -- -- --  CREATININE -- 0.69 -- --  CKTOTAL -- -- -- --  CKMB -- -- -- --  TROPONINI -- -- -- --   Estimated Creatinine Clearance: 94.3 ml/min (by C-G formula based on Cr of 0.69).  Assessment: 63 yom s/p stroke with atrial fibrillation requiring initiation of coumadin for further stroke prevention. INR is now subtherapeutic. No bleeding noted. No CBC available.   Goal of Therapy:  INR 2-3   Plan:  1. Coumadin 5mg  PO x 1 tonight 2. F/u AM INR  Jannely Henthorn, [name] 06/22/2011,7:44 AM

## 2011-06-23 LAB — PHENYTOIN LEVEL, FREE AND TOTAL
Phenytoin, Free: 3.6 mg/L — ABNORMAL HIGH (ref 1.0–2.0)
Phenytoin, Total: 24.3 mg/L — ABNORMAL HIGH (ref 10.0–20.0)

## 2011-06-23 LAB — PROTIME-INR: Prothrombin Time: 24.1 seconds — ABNORMAL HIGH (ref 11.6–15.2)

## 2011-06-23 MED ORDER — WARFARIN SODIUM 3 MG PO TABS
3.0000 mg | ORAL_TABLET | Freq: Once | ORAL | Status: AC
  Administered 2011-06-23: 3 mg via ORAL
  Filled 2011-06-23: qty 1

## 2011-06-23 NOTE — Progress Notes (Signed)
Occupational Therapy Session Note  Patient Details  Name: [name] MRN: [id number] Date of Birth: 01/01/1941  Today's Date: 06/23/2011 Time: 7829-5621 Time Calculation (min): 45 min  Short Term Goals: Week 3:  OT Short Term Goal 1 (Week 3): STG = LTG due to remaining LOS  Skilled Therapeutic Interventions/Progress Updates:    1) Pt seen for ADL retraining at walk-in shower in room with use of tub bench for increased safety.  Pt required increased safety cues for bathing with cues to use grab bars with sit to stand and with turning.   Pt with decreased safety with turns, especially on slippery shower floor, secondary to decreased Rt sided attention and safety awareness.  Pt continues to require cues for STM deficits and safety with all functional mobility.  2) Engaged in functional tasks in kitchen with RW with unloading dishwasher.  Pt required safety cues for RLE placement in standing with weight shifting down to dishwasher to unload dishes and cues for step length with RW when negotiating obstacles on floor.  Pt required min/steady assist with dynamic standing secondary to decreased safety awareness.  Pt required increased time and cues for sequencing in unloading dishwasher.  Therapy Documentation Precautions:  Precautions Precautions: Fall Precaution Comments: Dys. 3 textures and thin liquids; significant RLE weakness Required Braces or Orthoses: No Restrictions Weight Bearing Restrictions: No General:   Vital Signs: Therapy Vitals Temp: 98.4 F (36.9 C) Temp src: Oral Pulse Rate: 81  Resp: 18  BP: 131/77 mmHg Patient Position, if appropriate: Lying Oxygen Therapy SpO2: 97 % O2 Device: None (Room air) Pain: Pain Assessment Pain Assessment: No/denies pain Pain Score: 0-No pain  See FIM for current functional status  Therapy/Group: Individual Therapy  [name] 06/23/2011, 9:29 AM

## 2011-06-23 NOTE — Progress Notes (Signed)
Patient ID: [name], male   DOB: 06/11/1940, 71 y.o.   MRN: [id number] Patient ID: [name], male   DOB: [birth year], 71 y.o.   MRN: [id number] Patient ID: [name], male   DOB: 01/17/1941, 71 y.o.   MRN: [id number]  Subjective/Complaints:  Occasional gas Objective: Vital Signs: Blood pressure 131/77, pulse 81, temperature 98.4 F (36.9 C), temperature source Oral, resp. rate 18, height 6\' 1"  (1.854 m), weight 79.9 kg (176 lb 2.4 oz), SpO2 97.00%. No results found. Results for orders placed during the hospital encounter of 06/07/11 (from the past 72 hour(s))  PROTIME-INR     Status: Abnormal   Collection Time   06/21/11  7:25 AM      Component Value Range Comment   Prothrombin Time 23.0 (*) 11.6 - 15.2 (seconds)    INR 2.00 (*) 0.00 - 1.49    BASIC METABOLIC PANEL     Status: Abnormal   Collection Time   06/21/11  9:28 AM      Component Value Range Comment   Sodium 139  135 - 145 (mEq/L)    Potassium 3.8  3.5 - 5.1 (mEq/L)    Chloride 100  96 - 112 (mEq/L)    CO2 26  19 - 32 (mEq/L)    Glucose, Bld 110 (*) 70 - 99 (mg/dL)    Dominguez 10  6 - 23 (mg/dL)    Creatinine, Ser 0.86  0.50 - 1.35 (mg/dL)    Calcium 9.4  8.4 - 10.5 (mg/dL)    GFR calc non Af Amer >90  >90 (mL/min)    GFR calc Af Amer >90  >90 (mL/min)   PROTIME-INR     Status: Abnormal   Collection Time   06/22/11  6:35 AM      Component Value Range Comment   Prothrombin Time 21.6 (*) 11.6 - 15.2 (seconds)    INR 1.84 (*) 0.00 - 1.49    PROTIME-INR     Status: Abnormal   Collection Time   06/23/11  6:20 AM      Component Value Range Comment   Prothrombin Time 24.1 (*) 11.6 - 15.2 (seconds)    INR 2.12 (*) 0.00 - 1.49      Physical Exam  Nursing note and vitals reviewed.  Constitutional: He appears well-developed.  HENT: PERRL, EOMI  Head: Normocephalic.  Neck: Normal range of motion. No thyromegaly present.  Cardiovascular: Normal rate.  Pulmonary/Chest: Breath sounds normal. He has no  wheezes.  Abdominal: He exhibits no distension. There is no tenderness. BS are hyperactive Musculoskeletal: He exhibits no edema.  RLE with 2+ edema at ankle Neurological: He is alert.  Pt alert. Follows simple commnds, needing cueing at times. Orientation to person, place not time Right facial droop and tongue deviation. Speech dysarthric but Intelligible. . Patient is oriented x3 with good recall. e.Light touch intact RUE 4/5. RLE 3-/5. Mild right pronator drift and decreased FMC on right. DTR's 3+ on right. Toes up. Word finding deficits but able to communicate needs Skin: Skin is warm and dry  4/3 exam  Assessment/Plan: 1. Functional deficits secondary to L ACA infarct with RLE paresis which require 3+ hours per day of interdisciplinary therapy in a comprehensive inpatient rehab setting. Physiatrist is providing close team supervision and 24 hour management of active medical problems listed below. Physiatrist and rehab team continue to assess barriers to discharge/monitor patient progress toward functional and medical goals. FIM: FIM - Bathing Bathing Steps Patient Completed: Chest;Right  Arm;Left Arm;Abdomen;Front perineal area;Buttocks;Right upper leg;Left upper leg;Right lower leg (including foot);Left lower leg (including foot) Bathing: 5: Supervision: Safety issues/verbal cues  FIM - Upper Body Dressing/Undressing Upper body dressing/undressing steps patient completed: Thread/unthread right sleeve of pullover shirt/dresss;Thread/unthread left sleeve of pullover shirt/dress;Put head through opening of pull over shirt/dress;Pull shirt over trunk Upper body dressing/undressing: 5: Set-up assist to: Obtain clothing/put away FIM - Lower Body Dressing/Undressing Lower body dressing/undressing steps patient completed: Thread/unthread right underwear leg;Thread/unthread left underwear leg;Pull underwear up/down;Thread/unthread right pants leg;Thread/unthread left pants leg;Pull pants  up/down;Don/Doff left sock Lower body dressing/undressing: 4: Min-Patient completed 75 plus % of tasks  FIM - Toileting Toileting steps completed by patient: Adjust clothing prior to toileting;Performs perineal hygiene;Adjust clothing after toileting Toileting Assistive Devices: Grab bar or rail for support Toileting: 5: Supervision: Safety issues/verbal cues  FIM - Diplomatic Services operational officer Devices: Grab bars Toilet Transfers: 5-To toilet/BSC: Supervision (verbal cues/safety issues)  FIM - Banker Devices: Therapist, occupational: 5: Bed > Chair or W/C: Supervision (verbal cues/safety issues);5: Chair or W/C > Bed: Supervision (verbal cues/safety issues)  FIM - Locomotion: Wheelchair Distance: 150 Locomotion: Wheelchair: 0: Activity did not occur FIM - Locomotion: Ambulation Locomotion: Ambulation Assistive Devices: Designer, industrial/product Ambulation/Gait Assistance: 4: Min assist Locomotion: Ambulation: 4: Travels 150 ft or more with minimal assistance (Pt.>75%)  Comprehension Comprehension Mode: Auditory Comprehension: 3-Understands basic 50 - 74% of the time/requires cueing 25 - 50%  of the time  Expression Expression Mode: Verbal Expression: 3-Expresses basic 50 - 74% of the time/requires cueing 25 - 50% of the time. Needs to repeat parts of sentences.  Social Interaction Social Interaction: 4-Interacts appropriately 75 - 89% of the time - Needs redirection for appropriate language or to initiate interaction.  Problem Solving Problem Solving: 2-Solves basic 25 - 49% of the time - needs direction more than half the time to initiate, plan or complete simple activities  Memory Memory: 2-Recognizes or recalls 25 - 49% of the time/requires cueing 51 - 75% of the time  Medical Problem List and Plan:  1. left frontal lobe infarct as well as remote hemorrhagic infarct posterior left insular cortex and superior left temporal  lobe  2.. DVT Prophylaxis/Anticoagulation: Chronic Coumadin therapy. No bleeding/ following h and h  3. Recurrent seizure. Continue Dilantin and Keppra therapy as per neurology services. Will need followup Dilantin level tomorrow. . Monitor for any further seizure activity.  4. Dysphagia. Followup per speech therapy.Pt advanced to D3 diet. Monitor for any signs of aspiration  5. Atrial flutter with RVR. Continue Cardizem and follow up cardiology services. HR controlled at present.  6.  Loose stool:  improved c/o gas add mylicon. PPI 7.  Hypokalemia plus pedal edema reduce KCL and started aldactone, k wnl 8.  Dementia- needs routine and cueing  LOS (Days) 16 A FACE TO FACE EVALUATION WAS PERFORMED  Shaunessy Dobratz T 06/23/2011, 8:50 AM    Review of Systems  Cardiovascular: Positive for leg swelling. Negative for chest pain.  Gastrointestinal: Negative for nausea, vomiting, abdominal pain and diarrhea.  Neurological: Positive for focal weakness.  All other systems reviewed and are negative.

## 2011-06-23 NOTE — Progress Notes (Signed)
ANTICOAGULATION CONSULT NOTE - Follow Up Consult  Pharmacy Consult for coumadin Indication: atrial fibrillation  Vital Signs: Temp: 98.4 F (36.9 C) (04/03 0546) Temp src: Oral (04/03 0546) BP: 131/77 mmHg (04/03 0546) Pulse Rate: 81  (04/03 0546)  Labs:  [name] 06/23/11 0620 06/22/11 1610 06/21/11 0928 06/21/11 0725  HGB -- -- -- --  HCT -- -- -- --  PLT -- -- -- --  APTT -- -- -- --  LABPROT 24.1* 21.6* -- 23.0*  INR 2.12* 1.84* -- 2.00*  HEPARINUNFRC -- -- -- --  CREATININE -- -- 0.69 --  CKTOTAL -- -- -- --  CKMB -- -- -- --  TROPONINI -- -- -- --   Estimated Creatinine Clearance: 94.3 ml/min (by C-G formula based on Cr of 0.69).  Assessment: 71 y.o. M on warfarin for Afib and CVA with a therapeutic INR this a.m. (INR 2.12, goal of 2-3). No CBC today, no s/sx of bleeding noted.   Goal of Therapy:  INR 2-3  Plan:  1. Warfarin 3 mg x 1 dose at 1800 today 2. Will continue to monitor for any signs/symptoms of bleeding and will follow up with PT/INR in the a.m.   Georgina Pillion, PharmD, BCPS Clinical Pharmacist Pager: [phone] 06/23/2011 1:15 PM

## 2011-06-23 NOTE — Progress Notes (Signed)
Speech Language Pathology Daily Session Note  Patient Details  Name: [name] MRN: [id number] Date of Birth: Jul 05, 1940  Today's Date: 06/23/2011 Time: 1405-1450 Time Calculation (min): 45 min  Short Term Goals: Week 2: SLP Short Term Goal 1 (Week 2): Patient will consume Dys.3 textures and thin liquids without obsevred s/s of aspiration with supervision semantic cues to utilize compensatory strategies SLP Short Term Goal 2 (Week 2): Patient will consume trials of regular textures with moderate assist semantic cues SLP Short Term Goal 3 (Week 2): Patient will request help as needed with minimal assist sematnic cues SLP Short Term Goal 4 (Week 2): Patient will follow 2 step directions with minimal assist semantic and visual cues SLP Short Term Goal 5 (Week 2): Patient will verbally express basic needs/wants with minimal assist semantic cues  Skilled Therapeutic Interventions: Session focused on skilled intervention of receptive and expressive language abilities as well as cognition; SLP facilitated session with moderate assist semantic cues to utilize procedures for safe sit to stand, moderate assist semantic and demonstrative cues for stepping over obstacles with a walker and max assist semantic, demonstrative and tactile cues to safely utilize walker during side stepping.  Language deficits were addressed during a compare and contrast activity when provided with pictures of common objects and SLP facilitated activity with increased wait time, rephrasing and minimal assist semantic cues.   Daily Session Precautions/Restrictions  Precautions Precautions: Fall Required Braces or Orthoses: No Restrictions Weight Bearing Restrictions: No FIM:  Comprehension Comprehension Mode: Auditory Comprehension: 4-Understands basic 75 - 89% of the time/requires cueing 10 - 24% of the time Expression Expression Mode: Verbal Expression: 4-Expresses basic 75 - 89% of the time/requires cueing 10  - 24% of the time. Needs helper to occlude trach/needs to repeat words. Social Interaction Social Interaction: 5-Interacts appropriately 90% of the time - Needs monitoring or encouragement for participation or interaction. Problem Solving Problem Solving: 3-Solves basic 50 - 74% of the time/requires cueing 25 - 49% of the time Memory Memory: 2-Recognizes or recalls 25 - 49% of the time/requires cueing 51 - 75% of the time FIM - Eating Eating Activity: 6: More than reasonable amount of time General    Pain Pain Assessment Pain Assessment: No/denies pain Pain Score: 0-No pain  Therapy/Group: Individual Therapy  Charlane Ferretti., CCC-SLP [phone]  Mirna Sutcliffe 06/23/2011, 3:59 PM

## 2011-06-23 NOTE — Progress Notes (Signed)
Patient ID: [name], male   DOB: [birth year], 72 y.o.   MRN: [id number] Met with pt To report on team conference. Pt looking forward to d/c on 4/9. Called pt's dtr/PCG, she confirmed that she will be here all day Mon for education. She understands need for 24/7 supervision d/t safety issues.

## 2011-06-23 NOTE — Patient Care Conference (Signed)
Inpatient RehabilitationTeam Conference Note Date: 06/23/2011   Time: 10:40 AM    Patient Name: [name] [facility]      Medical Record Number: [id number]  Date of Birth: 1940/10/16 Sex: Male         Room/Bed: 4028/4028-02 Payor Info: Payor: MEDICARE  Plan: MEDICARE PART A AND B  Product Type: *No Product type*     Admitting Diagnosis: LT Frontal CVA  Admit Date/Time:  06/07/2011  4:45 PM Admission Comments: No comment available   Primary Diagnosis:  CVA (cerebral infarction) Principal Problem: CVA (cerebral infarction)  Patient Active Problem List  Diagnoses Date Noted  . CVA (cerebral infarction) 06/08/2011  . Atrial flutter 06/04/2011  . Status epilepticus 06/02/2011  . 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 SINUSITIS, CHRONIC NEC 12/14/2006  . ANEMIA, DEFICIENCY NOS 10/11/2006  . ABUSE, ALCOHOL, CONTINUOUS 10/11/2006  . 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 ABNORMAL RESULT, FUNCTION STUDY, LIVER 10/11/2006  . CEREBROVASCULAR ACCIDENT, HX OF 10/11/2006  . DEMENTIA 09/15/2006  . HYPERTENSION 09/15/2006  . CVA 09/15/2006  . ALLERGIC RHINITIS 09/15/2006  . CIRRHOSIS, ALCOHOLIC, LIVER 09/15/2006  . SEIZURE DISORDER 09/15/2006  . PANCREATITIS, HX OF 09/15/2006    Expected Discharge Date: Expected Discharge Date: 06/29/11  Team Members Present: Physician: Dr. Claudette Laws Case Manager Present: Lutricia Horsfall, RN Social Worker Present: Dossie Der, LCSW Nurse Present: Gregor Hams, RN PT Present: Edman Circle, PT OT Present: Bretta Bang, Verlene Mayer, OT SLP Present: Fae Pippin, SLP     Current Status/Progress Goal Weekly Team Focus  Medical   left ich. still some gas--mylicon has helped.  expressive language deficits still present, no more loose stool  see prior  follow up labs, dilantin level   Bowel/Bladder     Incont of bowel at times d/t urgency  No incontinent episodes    Regular toileting  Swallow/Nutrition/ Hydration   Dys. 3 thin  liquids with intermittent staff supervision, medication whole in puree  least restrictive p.o. intake  increase patient self monitoring and carryover of positioning and use of double swallows to reduce residue   ADL's   supervision bathing, min assist LB dressing, min assist transfers with stand pivot and amublation with RW   supervision overall  safety education, standing tolerance, advanced ADLs   Mobility   supervision-min A overall but max-total cues for safety sequencing  mod I w/c mobility, supervision basic transfers and ambulation with RW, min A stairs   safety, gait, stairs and balance   Communication   min-mod assist with basic  min  increase awareness of deficits; family education   Safety/Cognition/ Behavioral Observations  mod assist   Bed alarm and quick release belt min assist-supervision with basic  increase awareness and carryover of routines   Pain     n/a        Skin     n/a           *See Interdisciplinary Assessment and Plan and progress notes for long and short-term goals  Barriers to Discharge: poor safety awareness, impulsivity    Possible Resolutions to Barriers:  supervision, CPT, VPT training and education    Discharge Planning/Teaching Needs:  Myrene Buddy with daughter providing assistance- needs to come in for family education      Team Discussion:  Pt making good progress. Continues with poor insight. Fall risk. Discussed d/c plan. Dtr to come for education Mon.  Revisions to Treatment Plan:     Continued Need for Acute Rehabilitation Level of Care: The patient  requires daily medical management by a physician with specialized training in physical medicine and rehabilitation for the following conditions: Daily direction of a multidisciplinary physical rehabilitation program to ensure safe treatment while eliciting the highest outcome that is of practical value to the patient.: Yes Daily medical management of patient stability for increased activity during  participation in an intensive rehabilitation regime.: Yes Daily analysis of laboratory values and/or radiology reports with any subsequent need for medication adjustment of medical intervention for : Neurological problems;Other  [name] 06/23/2011, 4:06 PM

## 2011-06-23 NOTE — Progress Notes (Signed)
Occupational Therapy Note  Patient Details  Name: [name] MRN: [id number] Date of Birth: January 18, 1941 Today's Date: 06/23/2011  Time: 1130-1145 Pt denies pain Group Therapy Pt participated in self-feeding group with focus on BUE use and increased socialization.  Pt does not initiate interaction with other members of group.  Pt requires increased time to complete meal and is very methodical.  Rich Brave 06/23/2011, 3:44 PM

## 2011-06-23 NOTE — Progress Notes (Signed)
Speech Pathology: Dysphagia Treatment Note  Group Session  [phone]  Patient was observed with : Mechanical Soft / Dys.3 and Thin liquids.  Patient was noted to have s/s of aspiration : Yes, intermittent throat clears  Lung Sounds:  WNL Temperature: WNL  Patient required: supervision cues to consistently follow precautions/strategies of double swallows   Clinical Impression: SLP facilitated session with supervision semantic cues to utilize double swallows with lunch (soup); throat clear increased as meal went on due to suspected increase of residue.   Recommendations:  Continue with current orders and trials of regular textures.  Pain:   none Intervention Required:   No  Goals: Progressing  [name], M.A., CCC-SLP [phone]

## 2011-06-23 NOTE — Progress Notes (Signed)
Physical Therapy Session Note  Patient Details  Name: [name] MRN: [id number] Date of Birth: 1940-07-11  Today's Date: 06/23/2011 Time: 4098-1191 Time Calculation (min): 45 min  Short Term Goals: Week 2:  PT Short Term Goal 1 (Week 2): = LTG: supervision overall     Skilled Therapeutic Interventions/Progress Updates: Treatment focused on neuromuscular re-education, therapeutic exercise, balance and mobility training.  In sitting in w/c, pt donned bil shoes, but required min A to insert R heel fully into shoes; he was unable to use a shoe horn.  Gait training with RW x 130' to and from room to gym, focusing on upright posture, decreased reliance on UEs on RW, with close supervision.  Therapeutic activities in standing for dynamic balance with neuromuscular re-education R hip swing components, and R hip stance components, with LOB L and backwards several times.    SPt performed sitt>< stand working on eccentric control without use of UEs for support.  Pt remembered to place his L hand on support surface during transfers, instead of on his RW, 5/6 trials.  When fatigued after walking back to his room, pt left bil hands on the RW and sat impulsively in his w/c.       Therapy Documentation Precautions:  Precautions Precautions: Fall Precaution Comments: Dys. 3 textures and thin liquids; significant RLE weakness Required Braces or Orthoses: No Restrictions Weight Bearing Restrictions: No   Pain: Pain Assessment Pain Assessment: No/denies pain Pain Score: 0-No pain    Exercises: Therapeutic exercise performed with LEs to increase strength for functional mobility:  In 4 point position on hands and knees on mat, 5 x 1 each alternating arm lifts, leg lifts with min @ for straight knees, 5 x 1 rhythmic stabilization pelvis.       See FIM for current functional status  Therapy/Group: Individual Therapy  [name] 06/23/2011, 12:12 PM

## 2011-06-24 LAB — CBC
HCT: 33 % — ABNORMAL LOW (ref 39.0–52.0)
Hemoglobin: 11 g/dL — ABNORMAL LOW (ref 13.0–17.0)
MCH: 29.7 pg (ref 26.0–34.0)
MCHC: 33.3 g/dL (ref 30.0–36.0)

## 2011-06-24 MED ORDER — SACCHAROMYCES BOULARDII 250 MG PO CAPS
250.0000 mg | ORAL_CAPSULE | Freq: Two times a day (BID) | ORAL | Status: DC
  Administered 2011-06-24 – 2011-06-29 (×11): 250 mg via ORAL
  Filled 2011-06-24 (×13): qty 1

## 2011-06-24 MED ORDER — WARFARIN SODIUM 3 MG PO TABS
3.0000 mg | ORAL_TABLET | Freq: Once | ORAL | Status: AC
  Administered 2011-06-24: 3 mg via ORAL
  Filled 2011-06-24: qty 1

## 2011-06-24 NOTE — Progress Notes (Signed)
Occupational Therapy Session Note  Patient Details  Name: [name] MRN: [id number] Date of Birth: Jan 27, 1941  Today's Date: 06/24/2011 Time: 0930-1030 Time Calculation (min): 60 min  Short Term Goals: Week 3:  OT Short Term Goal 1 (Week 3): STG = LTG due to remaining LOS  Skilled Therapeutic Interventions/Progress Updates:    Pt seen for ADL retraining with focus on carryover of techniques taught to increase safety, balance, and transfers with self-cares and functional mobility.  Pt demonstrated increased carryover of RLE placement prior to sit to stands and increased awareness of RLE in standing.  Pt continues to require safety cues with turns, especially in shower; use of towel on floor to decrease slipping when standing in shower.    Therapy Documentation Precautions:  Precautions Precautions: Fall Precaution Comments: Dys. 3 textures and thin liquids; significant RLE weakness Required Braces or Orthoses: No Restrictions Weight Bearing Restrictions: No General:   Vital Signs:   Pain: Pain Assessment Pain Assessment: 0-10 Pain Score: 0-No pain Patients Stated Pain Goal: 3 Multiple Pain Sites: No  See FIM for current functional status  Therapy/Group: Individual Therapy  [name] 06/24/2011, 12:14 PM

## 2011-06-24 NOTE — Progress Notes (Addendum)
Patient ID: [name], male   DOB: 05/19/1940, 71 y.o.   MRN: [id number]  Subjective/Complaints:  Complains of loose stool yesterday Objective: Vital Signs: Blood pressure 124/61, pulse 84, temperature 98.8 F (37.1 C), temperature source Oral, resp. rate 19, height 6\' 1"  (1.854 m), weight 81.6 kg (179 lb 14.3 oz), SpO2 96.00%. No results found. Results for orders placed during the hospital encounter of 06/07/11 (from the past 72 hour(s))  BASIC METABOLIC PANEL     Status: Abnormal   Collection Time   06/21/11  9:28 AM      Component Value Range Comment   Sodium 139  135 - 145 (mEq/L)    Potassium 3.8  3.5 - 5.1 (mEq/L)    Chloride 100  96 - 112 (mEq/L)    CO2 26  19 - 32 (mEq/L)    Glucose, Bld 110 (*) 70 - 99 (mg/dL)    Dominguez 10  6 - 23 (mg/dL)    Creatinine, Ser 7.82  0.50 - 1.35 (mg/dL)    Calcium 9.4  8.4 - 10.5 (mg/dL)    GFR calc non Af Amer >90  >90 (mL/min)    GFR calc Af Amer >90  >90 (mL/min)   PROTIME-INR     Status: Abnormal   Collection Time   06/22/11  6:35 AM      Component Value Range Comment   Prothrombin Time 21.6 (*) 11.6 - 15.2 (seconds)    INR 1.84 (*) 0.00 - 1.49    PROTIME-INR     Status: Abnormal   Collection Time   06/23/11  6:20 AM      Component Value Range Comment   Prothrombin Time 24.1 (*) 11.6 - 15.2 (seconds)    INR 2.12 (*) 0.00 - 1.49    PROTIME-INR     Status: Abnormal   Collection Time   06/24/11  6:01 AM      Component Value Range Comment   Prothrombin Time 25.6 (*) 11.6 - 15.2 (seconds)    INR 2.29 (*) 0.00 - 1.49    PHENYTOIN LEVEL, TOTAL     Status: Abnormal   Collection Time   06/24/11  6:01 AM      Component Value Range Comment   Phenytoin Lvl 27.5 (*) 10.0 - 20.0 (ug/mL)   CBC     Status: Abnormal   Collection Time   06/24/11  6:01 AM      Component Value Range Comment   WBC 4.5  4.0 - 10.5 (K/uL)    RBC 3.70 (*) 4.22 - 5.81 (MIL/uL)    Hemoglobin 11.0 (*) 13.0 - 17.0 (g/dL)    HCT 95.6 (*) 21.3 - 52.0 (%)    MCV 89.2   78.0 - 100.0 (fL)    MCH 29.7  26.0 - 34.0 (pg)    MCHC 33.3  30.0 - 36.0 (g/dL)    RDW 08.6  57.8 - 46.9 (%)    Platelets 364  150 - 400 (K/uL)     Physical Exam  Nursing note and vitals reviewed.  Constitutional: He appears well-developed.  HENT: PERRL, EOMI  Head: Normocephalic.  Neck: Normal range of motion. No thyromegaly present.  Cardiovascular: Normal rate.  Pulmonary/Chest: Breath sounds normal. He has no wheezes.  Abdominal: He exhibits no distension. There is no tenderness. BS are hyperactive Musculoskeletal: He exhibits no edema.  RLE with 2+ edema at ankle Neurological: He is alert.  Pt alert. Follows simple commnds, needing cueing at times. Orientation to person, place not  time Right facial droop and tongue deviation. Speech dysarthric but Intelligible. . Patient is oriented x3 with good recall. e.Light touch intact RUE 4/5. RLE 3-/5. Mild right pronator drift and decreased FMC on right. DTR's 3+ on right. Toes up. Word finding deficits but able to communicate needs Skin: Skin is warm and dry  4/4 exam  Assessment/Plan: 1. Functional deficits secondary to L ACA infarct with RLE paresis which require 3+ hours per day of interdisciplinary therapy in a comprehensive inpatient rehab setting. Physiatrist is providing close team supervision and 24 hour management of active medical problems listed below. Physiatrist and rehab team continue to assess barriers to discharge/monitor patient progress toward functional and medical goals. FIM: FIM - Bathing Bathing Steps Patient Completed: Right Arm;Left Arm;Chest;Abdomen;Front perineal area;Buttocks;Right upper leg;Left upper leg;Right lower leg (including foot);Left lower leg (including foot) Bathing: 5: Supervision: Safety issues/verbal cues  FIM - Upper Body Dressing/Undressing Upper body dressing/undressing steps patient completed: Thread/unthread right sleeve of pullover shirt/dresss;Thread/unthread left sleeve of pullover  shirt/dress;Put head through opening of pull over shirt/dress;Pull shirt over trunk Upper body dressing/undressing: 5: Set-up assist to: Obtain clothing/put away FIM - Lower Body Dressing/Undressing Lower body dressing/undressing steps patient completed: Thread/unthread right underwear leg;Thread/unthread left underwear leg;Pull underwear up/down;Thread/unthread right pants leg;Thread/unthread left pants leg;Pull pants up/down;Don/Doff left sock Lower body dressing/undressing: 4: Min-Patient completed 75 plus % of tasks  FIM - Toileting Toileting steps completed by patient: Adjust clothing prior to toileting Toileting Assistive Devices: Grab bar or rail for support Toileting: 5: Supervision: Safety issues/verbal cues  FIM - Diplomatic Services operational officer Devices: Building control surveyor Transfers: 4-From toilet/BSC: Min A (steadying Pt. > 75%)  FIM - Banker Devices: [facility] elevated;Walker Bed/Chair Transfer: 4: Bed > Chair or W/C: Min A (steadying Pt. > 75%);5: Bed > Chair or W/C: Supervision (verbal cues/safety issues)  FIM - Locomotion: Wheelchair Distance: 150 Locomotion: Wheelchair: 0: Activity did not occur FIM - Locomotion: Ambulation Locomotion: Ambulation Assistive Devices: Designer, industrial/product Ambulation/Gait Assistance: 4: Min assist Locomotion: Ambulation: 4: Travels 150 ft or more with minimal assistance (Pt.>75%)  Comprehension Comprehension Mode: Auditory Comprehension: 4-Understands basic 75 - 89% of the time/requires cueing 10 - 24% of the time  Expression Expression Mode: Verbal Expression: 4-Expresses basic 75 - 89% of the time/requires cueing 10 - 24% of the time. Needs helper to occlude trach/needs to repeat words.  Social Interaction Social Interaction: 5-Interacts appropriately 90% of the time - Needs monitoring or encouragement for participation or interaction.  Problem Solving Problem Solving: 3-Solves  basic 50 - 74% of the time/requires cueing 25 - 49% of the time  Memory Memory: 2-Recognizes or recalls 25 - 49% of the time/requires cueing 51 - 75% of the time  Medical Problem List and Plan:  1. 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 DVT Prophylaxis/Anticoagulation: Chronic Coumadin therapy. No bleeding/ following h and h  3. Recurrent seizure. Continue Dilantin and Keppra therapy as per neurology services. Dilantin level today is supratherapeutic. Hold and resume at lower dose tomorrow.. Monitor for any further seizure activity.  4. Dysphagia. Followup per speech therapy.Pt advanced to D3 diet. Monitor for any signs of aspiration  5. Atrial flutter with RVR. Continue Cardizem and follow up cardiology services. HR controlled at present.  6.  Loose stool:  He complains of it still, yet none was reported yesterday. Add probiotic. Cognitive component likely 7.  Hypokalemia  plus pedal edema reduce KCL and started aldactone, k wnl 8.  Dementia- needs routine and cueing  LOS (Days) 17 A FACE TO FACE EVALUATION WAS PERFORMED  Cynai Skeens T 06/24/2011, 8:13 AM    Review of Systems  Cardiovascular: Positive for leg swelling. Negative for chest pain.  Gastrointestinal: Negative for nausea, vomiting, abdominal pain and diarrhea.  Neurological: Positive for focal weakness.  All other systems reviewed and are negative.

## 2011-06-24 NOTE — Progress Notes (Signed)
Speech Language Pathology Daily Session Note  Patient Details  Name: [name] MRN: [id number] Date of Birth: May 12, 1940  Today's Date: 06/24/2011 Time: 4098-1191 Time Calculation (min): 45 min  Short Term Goals: Week 2: SLP Short Term Goal 1 (Week 2): Patient will consume Dys.3 textures and thin liquids without obsevred s/s of aspiration with supervision semantic cues to utilize compensatory strategies SLP Short Term Goal 2 (Week 2): Patient will consume trials of regular textures with moderate assist semantic cues SLP Short Term Goal 3 (Week 2): Patient will request help as needed with minimal assist sematnic cues SLP Short Term Goal 4 (Week 2): Patient will follow 2 step directions with minimal assist semantic and visual cues SLP Short Term Goal 5 (Week 2): Patient will verbally express basic needs/wants with minimal assist semantic cues  Skilled Therapeutic Interventions: SLP entered room to patient coughing while eating breakfast.  SLP educated patient on importance of eating while sitting up right and recommended getting out of bed to sit up in chair for meals; patient verbalized understanding.  RN administered medication whole with thin liquid (one at a time) per SLP request and patient consumed with no overt s/s of aspiration. Upon cues to get up for SLP session patient with max assist to acknowledge incontinent episode and moderate assist semantic and tactile cues to problem solve and sequence and make safe decisions while changing clothes and cleaning self.  Patient completed daily math problems during functional tasks with significant increased wait time and minimal assist semantic cues.  Recommend: out of bed for all p.o.; upgrade medication to whole with liquid (one at a time)  Daily Session Precautions/Restrictions    FIM:  Comprehension Comprehension Mode: Auditory Comprehension: 5-Understands complex 90% of the time/Cues < 10% of the time Expression Expression  Mode: Verbal Expression: 5-Expresses complex 90% of the time/cues < 10% of the time Social Interaction Social Interaction: 6-Interacts appropriately with others with medication or extra time (anti-anxiety, antidepressant). Problem Solving Problem Solving: 5-Solves complex 90% of the time/cues < 10% of the time Memory Memory: 6-More than reasonable amt of time FIM - Eating Eating Activity: 6: More than reasonable amount of time General    Pain Pain Assessment Pain Assessment: No/denies pain Pain Score: 0-No pain  Therapy/Group: Individual Therapy  Charlane Ferretti., CCC-SLP [phone]  Ines Rebel 06/24/2011, 12:59 PM

## 2011-06-24 NOTE — Progress Notes (Signed)
Occupational Therapy Note  Patient Details  Name: [name] MRN: [id number] Date of Birth: 19-Jan-1941 Today's Date: 06/24/2011  Time: 1130-1200 Pt denied pain Group Therapy  Pt participated in self-feeding group with focus on BUE use and increased socialization. Pt does not initiate interaction with other members of group. Pt initiated interaction only when making request for supplies.  Pt requires increased time to complete meal and is very methodical.      Rich Brave 06/24/2011, 4:01 PM

## 2011-06-24 NOTE — Progress Notes (Signed)
Per State Regulation 482.30 This chart was reviewed for medical necessity with respect to the patient's Admission/Duration of stay. Pt participating in therapies with good progress toward goals. Monitoring Dilantin level, adjustment made today.  [name]                 Nurse Care Manager            Next Review Date: 06/28/11

## 2011-06-24 NOTE — Plan of Care (Signed)
Problem: RH SAFETY Goal: RH STG DEMO UNDERSTANDING HOME SAFETY PRECAUTIONS Min. assist  Outcome: Progressing Pt will say "yes" to aware needs help, no family present to educate

## 2011-06-24 NOTE — Progress Notes (Signed)
Physical Therapy Note  Patient Details  Name: [name] MRN: [id number] Date of Birth: Aug 16, 1940 Today's Date: 06/24/2011  1300-1355 (55 minutes) group Pain : no complaint of pain Pt participated in PT group focusing on gait safety./endurance. Pt ambulated without AD min/mod assist focusing on trunk and right hip extension in stance (pt reaches to ceiling with Lt. UE ) and tactile cues at shoulder .(180 feet X 3).   Ronne Savoia,JIM 06/24/2011, 1:30 PM

## 2011-06-24 NOTE — Plan of Care (Signed)
Problem: RH BOWEL ELIMINATION Goal: RH STG MANAGE BOWEL WITH ASSISTANCE STG Manage Bowel with min. Assistance.  Outcome: Progressing Pt can be incontinent if has to wait for staff too long before he can get into bathroom if having loose stool, starting on florastar today

## 2011-06-24 NOTE — Progress Notes (Signed)
Speech Pathology: Dysphagia Treatment Note  Group Session  1200-1230  Patient was observed with : Regular textures and Thin liquids.  Patient was noted to have s/s of aspiration : Yes  Lung Sounds:  WNL Temperature: WNL  Patient required: supervision cues to consistently follow precautions/strategies   Clinical Impression: SLP facilitated session with supervision semantic cues to utilize extra swallows following throat clears.  This typically occurs toward the end of meals due to the build up of residue and patient continues to demonstrate difficulty carrying over use of strategy.    Recommendations:  Diet up grade to regular textures  Pain:   none Intervention Required:   No  Goals: Goals Partially Met  Fae Pippin, M.A., CCC-SLP [phone]

## 2011-06-24 NOTE — Progress Notes (Signed)
ANTICOAGULATION CONSULT NOTE - Follow Up Consult  Pharmacy Consult for coumadin Indication: atrial fibrillation  Vital Signs: Temp: 98.8 F (37.1 C) (04/04 0500) Temp src: Oral (04/04 0500) BP: 124/61 mmHg (04/04 0500) Pulse Rate: 84  (04/04 0500)  Labs:  Basename 06/24/11 0601 06/23/11 0620 06/22/11 0635  HGB 11.0* -- --  HCT 33.0* -- --  PLT 364 -- --  APTT -- -- --  LABPROT 25.6* 24.1* 21.6*  INR 2.29* 2.12* 1.84*  HEPARINUNFRC -- -- --  CREATININE -- -- --  CKTOTAL -- -- --  CKMB -- -- --  TROPONINI -- -- --   Estimated Creatinine Clearance: 94.3 ml/min (by C-G formula based on Cr of 0.69).  Assessment: 71 y.o. M on warfarin for Afib and CVA with a therapeutic INR this a.m. (INR 2.29, goal of 2-3). Hgb/Hct/Plt ok, no s/sx of bleeding noted.   Goal of Therapy:  INR 2-3  Plan:  1. Warfarin 3 mg x 1 dose at 1800 today 2. Will continue to monitor for any signs/symptoms of bleeding and will follow up with PT/INR in the a.m.   Georgina Pillion, PharmD, BCPS Clinical Pharmacist Pager: [phone] 06/24/2011 1:54 PM

## 2011-06-25 DIAGNOSIS — Z5189 Encounter for other specified aftercare: Secondary | ICD-10-CM

## 2011-06-25 DIAGNOSIS — I6330 Cerebral infarction due to thrombosis of unspecified cerebral artery: Secondary | ICD-10-CM

## 2011-06-25 MED ORDER — WARFARIN SODIUM 2 MG PO TABS
2.0000 mg | ORAL_TABLET | Freq: Once | ORAL | Status: AC
  Administered 2011-06-25: 2 mg via ORAL
  Filled 2011-06-25: qty 1

## 2011-06-25 MED ORDER — PHENYTOIN 50 MG PO CHEW
100.0000 mg | CHEWABLE_TABLET | Freq: Two times a day (BID) | ORAL | Status: DC
  Administered 2011-06-25 – 2011-06-27 (×6): 100 mg via ORAL
  Filled 2011-06-25 (×9): qty 2

## 2011-06-25 NOTE — Progress Notes (Signed)
Physical Therapy Session Note  Patient Details  Name: [name] MRN: [id number] Date of Birth: November 11, 1940  Today's Date: 06/25/2011 Time: 4098-1191 Time Calculation (min): 56 min  Short Term Goals: Week 2:  PT Short Term Goal 1 (Week 2): = LTG: supervision overall  Skilled Therapeutic Interventions/Progress Updates:    Gait training on unit initially with RW x 300' and supervision, then without assistive device x 300' and min@. Facilitation of trunk alignment over right LE during gait, pt leans to the left during gait to decrease weight on the right LE during stance phase. Focused training of knee and hip control with step ups on right LE teaching pt how to gain control of minimal knee flexion without locking knee into extension in stance. Sideways step ups focusing on hip abduction control, pt having a tendency to rotate trunk to compensate for weakness, difficult to get pt to maintain correct alignment to perform.  Standing on right LE lifting left LE up to the side focusing on trunk and right LE alignment, pt unable to maintain right hip abduction to perform, needing max facilitation to maintain alignment.  Pt also had difficulty attending to this task and correcting the detail.  Therapy Documentation Precautions:  Precautions  Precautions: Fall Pain: Pain Assessment Pain Assessment: No/denies pain See FIM for current functional status  Therapy/Group: Individual Therapy  [name] 06/25/2011, 4:08 PM

## 2011-06-25 NOTE — Progress Notes (Signed)
Speech Pathology: Dysphagia Treatment Note  Group Session  [phone]  Patient was observed with : Regular textures and Thin liquids.  Patient was noted to have s/s of aspiration : Yes  Lung Sounds:  WNL Temperature: WNL  Patient required: no cues to consistently follow precautions/strategies  Clinical Impression: Patient consumed meal with cough x1 and required increased wait time to utilize extra swallow to reduce suspected penetration or residue.       Recommendations:  Continue with plan of care; and educate daughter on importance of sitting up 90 degrees with p.o. Monday 4/8 prior to discharge  Pain:   none Intervention Required:   No  Goals: Goals Partially Met  Fae Pippin, M.A., CCC-SLP [phone]

## 2011-06-25 NOTE — Progress Notes (Signed)
ANTICOAGULATION CONSULT NOTE - Follow Up Consult  Pharmacy Consult for coumadin Indication: atrial fibrillation  Vital Signs: Temp: 98.6 F (37 C) (04/05 0514) Temp src: Oral (04/05 0514) BP: 120/70 mmHg (04/05 0514) Pulse Rate: 67  (04/05 0514)  Labs:  Basename 06/25/11 0630 06/24/11 0601 06/23/11 0620  HGB -- 11.0* --  HCT -- 33.0* --  PLT -- 364 --  APTT -- -- --  LABPROT 28.7* 25.6* 24.1*  INR 2.65* 2.29* 2.12*  HEPARINUNFRC -- -- --  CREATININE -- -- --  CKTOTAL -- -- --  CKMB -- -- --  TROPONINI -- -- --   Estimated Creatinine Clearance: 94.3 ml/min (by C-G formula based on Cr of 0.69).  Assessment: 71 y.o. M on warfarin for Afib and CVA with a therapeutic INR this a.m. that is rising towards the upper end of the therapeutic range. Hgb/Hct/Plt ok, no s/sx of bleeding noted.   Goal of Therapy:  INR 2-3  Plan:  1. Warfarin 2 mg x 1 dose at 1800 today 2. Will continue to monitor for any signs/symptoms of bleeding and will follow up with PT/INR in the a.m.   Estella Husk, Pharm.D., BCPS Clinical Pharmacist  Pager [phone] 06/25/2011, 1:02 PM

## 2011-06-25 NOTE — Progress Notes (Signed)
Social Work Patient ID: [name], male   DOB: 1940/08/18, 71 y.o.   MRN: [id number] Spoke with daughter she will be here all day Monday to attend therapies with pt to learn his care. She is aware he will require 24 hour supervision due to safety and balance issues.  Will discuss Follow up care and confirm DME needs on Monday.

## 2011-06-25 NOTE — Progress Notes (Signed)
Occupational Therapy Session Note  Patient Details  Name: RYDER MAN MRN: [id number] Date of Birth: 1940-05-04  Today's Date: 06/25/2011 Time: 0930-1030 Time Calculation (min): 60 min  Short Term Goals: Week 3:  OT Short Term Goal 1 (Week 3): STG = LTG due to remaining LOS  Skilled Therapeutic Interventions/Progress Updates:    Pt seen for ADL retraining with focus on safe ambulation with RW, sit to stand with RLE placement with decreased cues, and safety with bathing at sit to stand level.  Pt continues to require cues for safety with transfers and with turns, especially in shower.  Pt demonstrated increased awareness of RLE placement with sit to stand today, requiring fewer cues this session.  Pt still requests this therapist leave during bathing, with continued explanation that he continues to require supervision for his safety, decreased RLE awareness/proprioception, STM deficits, and problem solving.    Therapy Documentation Precautions:  Precautions Precautions: Fall Precaution Comments: Dys. 3 textures and thin liquids; significant RLE weakness Required Braces or Orthoses: No Restrictions Weight Bearing Restrictions: No General:   Vital Signs:   Pain: Pain Assessment Pain Assessment: No/denies pain Pain Score: 0-No pain  See FIM for current functional status  Therapy/Group: Individual Therapy  [name] 06/25/2011, 10:46 AM

## 2011-06-25 NOTE — Progress Notes (Signed)
Occupational Therapy Note  Patient Details  Name: [name] MRN: [id number] Date of Birth: [birth year] Today's Date: 06/25/2011  Time: 1130-1145 Pt denies pain Group Therapy  Pt participated in self-feeding group with focus on BUE use and increased socialization.  Pt uses BUE for opening containers and cutting up food. Pt initiated interaction only when making request for supplies. Pt requires increased time to complete meal and is very methodical.    Rich Brave 06/25/2011, 3:45 PM

## 2011-06-25 NOTE — Progress Notes (Signed)
Patient ID: Maryruth Bun, male   DOB: February 24, 1941, 71 y.o.   MRN: [id number]  Patient ID: BRAYLAN FAUL, male   DOB: 1940-03-27, 71 y.o.   MRN: [id number]  Subjective/Complaints: 4/5- no new issues today Objective: Vital Signs: Blood pressure 120/70, pulse 67, temperature 98.6 F (37 C), temperature source Oral, resp. rate 20, height 6\' 1"  (1.854 m), weight 81.6 kg (179 lb 14.3 oz), SpO2 96.00%. No results found. Results for orders placed during the hospital encounter of 06/07/11 (from the past 72 hour(s))  PROTIME-INR     Status: Abnormal   Collection Time   06/23/11  6:20 AM      Component Value Range Comment   Prothrombin Time 24.1 (*) 11.6 - 15.2 (seconds)    INR 2.12 (*) 0.00 - 1.49    PROTIME-INR     Status: Abnormal   Collection Time   06/24/11  6:01 AM      Component Value Range Comment   Prothrombin Time 25.6 (*) 11.6 - 15.2 (seconds)    INR 2.29 (*) 0.00 - 1.49    PHENYTOIN LEVEL, TOTAL     Status: Abnormal   Collection Time   06/24/11  6:01 AM      Component Value Range Comment   Phenytoin Lvl 27.5 (*) 10.0 - 20.0 (ug/mL)   CBC     Status: Abnormal   Collection Time   06/24/11  6:01 AM      Component Value Range Comment   WBC 4.5  4.0 - 10.5 (K/uL)    RBC 3.70 (*) 4.22 - 5.81 (MIL/uL)    Hemoglobin 11.0 (*) 13.0 - 17.0 (g/dL)    HCT 78.2 (*) 95.6 - 52.0 (%)    MCV 89.2  78.0 - 100.0 (fL)    MCH 29.7  26.0 - 34.0 (pg)    MCHC 33.3  30.0 - 36.0 (g/dL)    RDW 21.3  08.6 - 57.8 (%)    Platelets 364  150 - 400 (K/uL)   PROTIME-INR     Status: Abnormal   Collection Time   06/25/11  6:30 AM      Component Value Range Comment   Prothrombin Time 28.7 (*) 11.6 - 15.2 (seconds)    INR 2.65 (*) 0.00 - 1.49      Physical Exam  Nursing note and vitals reviewed.  Constitutional: He appears well-developed.  HENT: PERRL, EOMI  Head: Normocephalic.  Neck: Normal range of motion. No thyromegaly present.  Cardiovascular: Normal rate.  Pulmonary/Chest: Breath sounds  normal. He has no wheezes.  Abdominal: He exhibits no distension. There is no tenderness. BS are hyperactive Musculoskeletal: He exhibits no edema.  RLE with 2+ edema at ankle Neurological: He is alert.  Pt alert. Follows simple commnds, needing cueing at times. Orientation to person, place not time Right facial droop and tongue deviation. Speech dysarthric but Intelligible. . Patient is oriented x3 with good recall. e.Light touch intact RUE 4/5. RLE 3-/5. Mild right pronator drift and decreased FMC on right. DTR's 3+ on right. Toes up. Word finding deficits but able to communicate needs. Decreased STM Skin: Skin is warm and dry  4/5 exam  Assessment/Plan: 1. Functional deficits secondary to L ACA infarct with RLE paresis which require 3+ hours per day of interdisciplinary therapy in a comprehensive inpatient rehab setting. Physiatrist is providing close team supervision and 24 hour management of active medical problems listed below. Physiatrist and rehab team continue to assess barriers to discharge/monitor patient progress toward functional  and medical goals. FIM: FIM - Bathing Bathing Steps Patient Completed: Chest;Right Arm;Left Arm;Abdomen;Front perineal area;Buttocks;Right upper leg;Left upper leg;Right lower leg (including foot);Left lower leg (including foot) Bathing: 5: Supervision: Safety issues/verbal cues  FIM - Upper Body Dressing/Undressing Upper body dressing/undressing steps patient completed: Thread/unthread right sleeve of pullover shirt/dresss;Thread/unthread left sleeve of pullover shirt/dress;Put head through opening of pull over shirt/dress;Pull shirt over trunk Upper body dressing/undressing: 5: Set-up assist to: Obtain clothing/put away FIM - Lower Body Dressing/Undressing Lower body dressing/undressing steps patient completed: Thread/unthread right underwear leg;Thread/unthread left underwear leg;Pull underwear up/down;Thread/unthread right pants leg;Thread/unthread  left pants leg;Pull pants up/down;Don/Doff left sock Lower body dressing/undressing: 4: Min-Patient completed 75 plus % of tasks  FIM - Toileting Toileting steps completed by patient: Adjust clothing prior to toileting;Performs perineal hygiene;Adjust clothing after toileting Toileting Assistive Devices: Grab bar or rail for support Toileting: 5: Supervision: Safety issues/verbal cues  FIM - Diplomatic Services operational officer Devices: Building control surveyor Transfers: 5-To toilet/BSC: Supervision (verbal cues/safety issues);4-From toilet/BSC: Min A (steadying Pt. > 75%)  FIM - Bed/Chair Transfer Bed/Chair Transfer Assistive Devices: HOB elevated Bed/Chair Transfer: 5: Chair or W/C > Bed: Supervision (verbal cues/safety issues)  FIM - Locomotion: Wheelchair Distance: 150 Locomotion: Wheelchair: 0: Activity did not occur FIM - Locomotion: Ambulation Locomotion: Ambulation Assistive Devices: Designer, industrial/product Ambulation/Gait Assistance: 5: Supervision Locomotion: Ambulation: 5: Travels 150 ft or more with supervision/safety issues  Comprehension Comprehension Mode: Auditory Comprehension: 4-Understands basic 75 - 89% of the time/requires cueing 10 - 24% of the time  Expression Expression Mode: Verbal Expression: 4-Expresses basic 75 - 89% of the time/requires cueing 10 - 24% of the time. Needs helper to occlude trach/needs to repeat words.  Social Interaction Social Interaction: 5-Interacts appropriately 90% of the time - Needs monitoring or encouragement for participation or interaction.  Problem Solving Problem Solving: 4-Solves basic 75 - 89% of the time/requires cueing 10 - 24% of the time  Memory Memory: 3-Recognizes or recalls 50 - 74% of the time/requires cueing 25 - 49% of the time  Medical Problem List and Plan:  1. 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 DVT  Prophylaxis/Anticoagulation: Chronic Coumadin therapy. No bleeding/ following h and h  3. Recurrent seizure. Continue Dilantin and Keppra therapy as per neurology services. Dilantin level today is supratherapeutic. Resume dilantin today and recheck level monday.. Monitor for any further seizure activity.  4. Dysphagia. Followup per speech therapy.Pt advanced to D3 diet. Monitor for any signs of aspiration  5. Atrial flutter with RVR. Continue Cardizem and follow up cardiology services. HR controlled at present.  6.  Loose stool:  Probiotic added. Perseverates on this at times. Cognitive component likely 7.  Hypokalemia plus pedal edema reduced KCL and started aldactone, k wnl 8.  Dementia- needs routine and cueing  LOS (Days) 18 A FACE TO FACE EVALUATION WAS PERFORMED  Dorsey Authement T 06/25/2011, 8:15 AM    Review of Systems  Cardiovascular: Positive for leg swelling. Negative for chest pain.  Gastrointestinal: Negative for nausea, vomiting, abdominal pain and diarrhea.  Neurological: Positive for focal weakness.  All other systems reviewed and are negative.

## 2011-06-25 NOTE — Progress Notes (Signed)
Speech Language Pathology Daily Session Note & Weekly Progress Update  Patient Details  Name: [name] MRN: [id number] Date of Birth: [birth year]  Today's Date: 06/25/2011 Time: 4098-1191 Time Calculation (min): 45 min  Short Term Goals: Week 2: SLP Short Term Goal 1 (Week 2): Patient will consume Dys.3 textures and thin liquids without obsevred s/s of aspiration with supervision semantic cues to utilize compensatory strategies SLP Short Term Goal 2 (Week 2): Patient will consume trials of regular textures with moderate assist semantic cues SLP Short Term Goal 3 (Week 2): Patient will request help as needed with minimal assist sematnic cues SLP Short Term Goal 4 (Week 2): Patient will follow 2 step directions with minimal assist semantic and visual cues SLP Short Term Goal 5 (Week 2): Patient will verbally express basic needs/wants with minimal assist semantic cues  Skilled Therapeutic Interventions: Patient was seated edge of bed during breakfast and consumed regular textures and thin liquids with no overt s/s of aspiration and Mod I use of swallow techniques.  SLP facilitated session by reinforcing the benefit of eating while sitting up and the impact it has on his swallowing; Patient requested SLP help him order lunch and dinner and required min-mod assist semantic and visual cues with menu present to find meal according to time of day and date as well as understand that fish was not an option for dinner only lunch, SLP rephrased and showed with menu.   Daily Session Precautions/Restrictions    FIM:  Comprehension Comprehension Mode: Auditory Comprehension: 4-Understands basic 75 - 89% of the time/requires cueing 10 - 24% of the time Expression Expression Mode: Verbal Expression: 4-Expresses basic 75 - 89% of the time/requires cueing 10 - 24% of the time. Needs helper to occlude trach/needs to repeat words. Social Interaction Social Interaction: 6-Interacts appropriately with  others with medication or extra time (anti-anxiety, antidepressant). Problem Solving Problem Solving: 3-Solves basic 50 - 74% of the time/requires cueing 25 - 49% of the time Memory Memory: 3-Recognizes or recalls 50 - 74% of the time/requires cueing 25 - 49% of the time FIM - Eating Eating Activity: 6: More than reasonable amount of time General    Pain Pain Assessment Pain Assessment: No/denies pain Pain Score: 0-No pain  Therapy/Group: Individual Therapy     Speech Language Pathology Weekly Progress Note  Patient Details  Name: [name] MRN: [id number] Date of Birth: [birth year]  Short Term Goals: Week 2: SLP Short Term Goal 1 (Week 2): Patient will consume Dys.3 textures and thin liquids without obsevred s/s of aspiration with supervision semantic cues to utilize compensatory strategies SLP Short Term Goal 1 - Progress (Week 2): Met SLP Short Term Goal 2 (Week 2): Patient will consume trials of regular textures with moderate assist semantic cues SLP Short Term Goal 2 - Progress (Week 2): Met SLP Short Term Goal 3 (Week 2): Patient will request help as needed with minimal assist sematnic cues SLP Short Term Goal 3 - Progress (Week 2): Progressing toward goal SLP Short Term Goal 4 (Week 2): Patient will follow 2 step directions with minimal assist semantic and visual cues SLP Short Term Goal 4 - Progress (Week 2): Met SLP Short Term Goal 5 (Week 2): Patient will verbally express basic needs/wants with minimal assist semantic cues SLP Short Term Goal 5 - Progress (Week 2): Progressing toward goal Week 3: SLP Short Term Goal 1 (Week 3): Patient will consume regular textures and thin liquids with supervision semantic cues and no overt  s/s of aspiration SLP Short Term Goal 2 (Week 3): Patient will request help as needed with minimal assis semantic cues SLP Short Term Goal 3 (Week 3): Patient will follow 2-step directions during basic, familiar tasks with supervision assist  semantc and visual cues SLP Short Term Goal 4 (Week 3): Patient will verbally express basic wants/needs with minimal assist sematic cues  Weekly Progress Updates: Patient met 3 out of 5 short term goals this reporting period with gains in swallow function and ability to follow 2-step directions.  Patient made progress in the areas of verbally expressing wants and needs and requesting help as needed; however, did not meet these goals due to decrease awareness and self monitoring.  Patient and family care giver would continue to benefit from skilled SLP services to maximize independence with above areas and reduce burden of care upon discharge.    SLP Frequency: 1-2 X/day, 30-60 minutes;5 out of 7 days SLP Treatment/Interventions: Cognitive remediation/compensation;Cueing hierarchy;Dysphagia/aspiration precaution training;Environmental controls;Functional tasks;Internal/external aids;Patient/family education;Speech/Language facilitation;Therapeutic Activities;Therapeutic Exercise  Daily Session Cognition: Overall Cognitive Status: Impaired Arousal/Alertness: Awake/alert Orientation Level: Oriented to person;Oriented to place;Oriented to situation;Disoriented to time Attention: Selective;Alternating Selective Attention: Appears intact Alternating Attention: Impaired Alternating Attention Impairment: Verbal basic;Functional basic Memory: Impaired Memory Impairment: Storage deficit;Retrieval deficit;Decreased recall of new information;Decreased long term memory;Decreased short term memory;Prospective memory Awareness: Impaired Awareness Impairment: Emergent impairment;Intellectual impairment Problem Solving: Impaired Problem Solving Impairment: Functional basic;Verbal basic Reasoning: Impaired Sequencing: Impaired Organizing: Impaired Decision Making: Impaired Initiating: Impaired Self Monitoring: Impaired Self Correcting: Impaired Safety/Judgment: Impaired Oral/Motor: Oral Motor/Sensory  Function Overall Oral Motor/Sensory Function: Appears within functional limits for tasks assessed Labial ROM: Reduced right Labial Symmetry: Abnormal symmetry right Labial Strength: Reduced Lingual ROM: Reduced right Lingual Strength: Reduced Facial ROM: Reduced right Motor Speech Overall Motor Speech: Appears within functional limits for tasks assessed Respiration: Within functional limits Resonance: Within functional limits Articulation: Within functional limitis Intelligibility: Intelligible Motor Planning: Witnin functional limits Interfering Components: Hearing loss (sister reports HOH) Comprehension: Auditory Comprehension Overall Auditory Comprehension: Impaired Yes/No Questions: Impaired Basic Biographical Questions: 51-75% accurate Complex Questions: 25-49% accurate Commands: Impaired One Step Basic Commands: 75-100% accurate Two Step Basic Commands: 75-100% accurate Interfering Components: Attention;Working memory;Processing speed EffectiveTechniques: Armed forces training and education officer Discrimination: Within Function Limits Common Objects: Able for objects in room Reading Comprehension Reading Status: Impaired Sentence Level: Impaired Paragraph Level: Impaired Expression: Expression Primary Mode of Expression: Verbal Verbal Expression Overall Verbal Expression: Impaired Level of Generative/Spontaneous Verbalization: Phrase;Sentence Repetition: No impairment Level of Impairment: Word level Naming: Impairment Responsive: 51-75% accurate Confrontation: Impaired Common Objects: Able for objects in room Convergent: Not tested Divergent: Not tested Verbal Errors: Not aware of errors Pragmatics: Impairment Impairments: Interpretation of nonverbal communication;Topic maintenance;Turn Taking Interfering Components: Premorbid deficit Written Expression Dominant Hand: Right Written Expression: Within Functional  Limits Dictation Ability:  (check writing task)  Fae Pippin, M.A., CCC-SLP [phone]

## 2011-06-26 MED ORDER — WARFARIN SODIUM 2 MG PO TABS
2.0000 mg | ORAL_TABLET | Freq: Once | ORAL | Status: AC
  Administered 2011-06-26: 2 mg via ORAL
  Filled 2011-06-26: qty 1

## 2011-06-26 NOTE — Progress Notes (Signed)
Physical Therapy Note  Patient Details  Name: [name] MRN: [id number] Date of Birth: [birth year] Today's Date: 06/26/2011  1300 1355 (55 minutes) group Pain: no complaint of pain Pt. Participated in PT gait group focusing on gait safety/endurance. Pt ambulates with RW SBA for safety to from room (120 feet); without AD 150 feet X 2 min assist with improving trunk extension in stance; standing balance- ball toss using chest pass min assist for balance during reaching /stepping .  Malyah Ohlrich,JIM 06/26/2011, 2:08 PM

## 2011-06-26 NOTE — Progress Notes (Signed)
Patient ID: [name], male   DOB: 1941/01/01, 71 y.o.   MRN: [id number]  Subjective/Complaints: 4/6 No problems or complaints. Objective: Vital Signs: Blood pressure 121/68, pulse 86, temperature 98.4 F (36.9 C), temperature source Oral, resp. rate 18, height 6\' 1"  (1.854 m), weight 81.6 kg (179 lb 14.3 oz), SpO2 95.00%. No results found. Results for orders placed during the hospital encounter of 06/07/11 (from the past 72 hour(s))  PROTIME-INR     Status: Abnormal   Collection Time   06/24/11  6:01 AM      Component Value Range Comment   Prothrombin Time 25.6 (*) 11.6 - 15.2 (seconds)    INR 2.29 (*) 0.00 - 1.49    PHENYTOIN LEVEL, TOTAL     Status: Abnormal   Collection Time   06/24/11  6:01 AM      Component Value Range Comment   Phenytoin Lvl 27.5 (*) 10.0 - 20.0 (ug/mL)   CBC     Status: Abnormal   Collection Time   06/24/11  6:01 AM      Component Value Range Comment   WBC 4.5  4.0 - 10.5 (K/uL)    RBC 3.70 (*) 4.22 - 5.81 (MIL/uL)    Hemoglobin 11.0 (*) 13.0 - 17.0 (g/dL)    HCT 78.2 (*) 95.6 - 52.0 (%)    MCV 89.2  78.0 - 100.0 (fL)    MCH 29.7  26.0 - 34.0 (pg)    MCHC 33.3  30.0 - 36.0 (g/dL)    RDW 21.3  08.6 - 57.8 (%)    Platelets 364  150 - 400 (K/uL)   PROTIME-INR     Status: Abnormal   Collection Time   06/25/11  6:30 AM      Component Value Range Comment   Prothrombin Time 28.7 (*) 11.6 - 15.2 (seconds)    INR 2.65 (*) 0.00 - 1.49    PROTIME-INR     Status: Abnormal   Collection Time   06/26/11  6:30 AM      Component Value Range Comment   Prothrombin Time 30.2 (*) 11.6 - 15.2 (seconds)    INR 2.83 (*) 0.00 - 1.49      Physical Exam  Nursing note and vitals reviewed.  Constitutional: He appears well-developed.  Neck: Normal range of motion. No thyromegaly present.  Cardiovascular: Normal rate.  Pulmonary/Chest: Breath sounds normal. He has no wheezes.  Abdominal: He exhibits no distension. There is no tenderness. BS are  hyperactive Musculoskeletal: RLE with 2+ edema at ankle Neurological: Pt alert. Follows simple commnds, needing cueing at times. Orientation to person, place not time Right facial droop and tongue deviation. Speech dysarthric but Intelligible. . Patient is oriented x3 with good recall. Light touch intact RUE 4/5. RLE 3-/5. Mild right pronator drift and decreased FMC on right. DTR's 3+ on right. Toes up. Word finding deficits but able to communicate needs. Decreased STM Skin: Skin is warm and dry    Assessment/Plan: 1. Functional deficits secondary to L ACA infarct with RLE paresis which require 3+ hours per day of interdisciplinary therapy in a comprehensive inpatient rehab setting. Physiatrist is providing close team supervision and 24 hour management of active medical problems listed below. Physiatrist and rehab team continue to assess barriers to discharge/monitor patient progress toward functional and medical goals. FIM: FIM - Bathing Bathing Steps Patient Completed: Chest;Right Arm;Left Arm;Abdomen;Front perineal area;Buttocks;Right upper leg;Left upper leg;Right lower leg (including foot);Left lower leg (including foot) Bathing: 5: Supervision: Safety issues/verbal cues  FIM - Upper Body Dressing/Undressing Upper body dressing/undressing steps patient completed: Thread/unthread right sleeve of pullover shirt/dresss;Thread/unthread left sleeve of pullover shirt/dress;Put head through opening of pull over shirt/dress;Pull shirt over trunk Upper body dressing/undressing: 5: Set-up assist to: Obtain clothing/put away FIM - Lower Body Dressing/Undressing Lower body dressing/undressing steps patient completed: Thread/unthread right underwear leg;Thread/unthread left underwear leg;Pull underwear up/down;Thread/unthread right pants leg;Thread/unthread left pants leg;Pull pants up/down;Don/Doff right sock;Don/Doff left sock Lower body dressing/undressing: 5: Set-up assist to: Obtain clothing  FIM  - Toileting Toileting steps completed by patient: Adjust clothing prior to toileting;Performs perineal hygiene;Adjust clothing after toileting Toileting Assistive Devices: Grab bar or rail for support Toileting: 5: Supervision: Safety issues/verbal cues  FIM - Diplomatic Services operational officer Devices: Bedside commode;Walker (BSC over toilet) Toilet Transfers: 5-To toilet/BSC: Supervision (verbal cues/safety issues);4-From toilet/BSC: Min A (steadying Pt. > 75%)  FIM - Bed/Chair Transfer Bed/Chair Transfer Assistive Devices: HOB elevated Bed/Chair Transfer: 5: Bed > Chair or W/C: Supervision (verbal cues/safety issues);5: Chair or W/C > Bed: Supervision (verbal cues/safety issues)  FIM - Locomotion: Wheelchair Distance: 150 Locomotion: Wheelchair: 0: Activity did not occur FIM - Locomotion: Ambulation Locomotion: Ambulation Assistive Devices: Designer, industrial/product Ambulation/Gait Assistance: 5: Supervision Locomotion: Ambulation: 5: Travels 150 ft or more with supervision/safety issues  Comprehension Comprehension Mode: Auditory Comprehension: 4-Understands basic 75 - 89% of the time/requires cueing 10 - 24% of the time  Expression Expression Mode: Verbal Expression: 4-Expresses basic 75 - 89% of the time/requires cueing 10 - 24% of the time. Needs helper to occlude trach/needs to repeat words.  Social Interaction Social Interaction: 6-Interacts appropriately with others with medication or extra time (anti-anxiety, antidepressant).  Problem Solving Problem Solving: 3-Solves basic 50 - 74% of the time/requires cueing 25 - 49% of the time  Memory Memory: 3-Recognizes or recalls 50 - 74% of the time/requires cueing 25 - 49% of the time  Medical Problem List and Plan:  1. Left frontal lobe infarct as well as remote hemorrhagic infarct posterior left insular cortex and superior left temporal lobe  2.. DVT Prophylaxis/Anticoagulation: Chronic Coumadin therapy. No bleeding.  3.  Recurrent seizure. Continue Dilantin and Keppra therapy as per neurology services. Dilantin level 4/5 was supratherapeutic. recheck level monday.. Monitor for any further seizure activity.  4. Dysphagia. Followup per speech therapy.Pt advanced to D3 diet. Monitor for any signs of aspiration.  5. Atrial flutter with RVR. Continue Cardizem and follow up cardiology services. HR controlled at present.  6.  Loose stool:  Probiotic added. Perseverates on this at times. Cognitive component likely 7.  Hypokalemia hx plus pedal edema - reduced KCL and started aldactone, K WNL. 8.  Dementia- needs routine and cueing.  LOS (Days) 19 A FACE TO FACE EVALUATION WAS PERFORMED  IRETON,SUSAN C 06/26/2011, 10:00 AM   Agree as above  Rene Paci MD 06/26/11 11:56 AM  Review of Systems

## 2011-06-26 NOTE — Progress Notes (Signed)
ANTICOAGULATION CONSULT NOTE - Follow Up Consult  Pharmacy Consult for coumadin Indication: atrial fibrillation  Vital Signs: Temp: 98.4 F (36.9 C) (04/06 0606) Temp src: Oral (04/06 0606) BP: 121/68 mmHg (04/06 0606) Pulse Rate: 86  (04/06 0606)  Labs:  Basename 06/26/11 0630 06/25/11 0630 06/24/11 0601  HGB -- -- 11.0*  HCT -- -- 33.0*  PLT -- -- 364  APTT -- -- --  LABPROT 30.2* 28.7* 25.6*  INR 2.83* 2.65* 2.29*  HEPARINUNFRC -- -- --  CREATININE -- -- --  CKTOTAL -- -- --  CKMB -- -- --  TROPONINI -- -- --   Estimated Creatinine Clearance: 94.3 ml/min (by C-G formula based on Cr of 0.69).  Assessment: 71 y.o. M on warfarin for Afib and CVA with a therapeutic INR this a.m. that is rising towards the upper end of the therapeutic range. Hgb/Hct/Plt ok, no s/sx of bleeding noted.   Goal of Therapy:  INR 2-3  Plan:  1. Warfarin 2 mg x 1 dose at 1800 today 2. Will continue to monitor for any signs/symptoms of bleeding and will follow up with PT/INR in the a.m.   Estella Husk, Pharm.D., BCPS Clinical Pharmacist  Pager [phone] 06/26/2011, 1:44 PM

## 2011-06-26 NOTE — Therapy (Signed)
Occupational Therapy Note  Pt missed 30 minutes of [employer]/ self-feeding therapy because pt declined to come.  Pt stated to rehab tech that he was not hungry.

## 2011-06-27 LAB — PROTIME-INR
INR: 2.79 — ABNORMAL HIGH (ref 0.00–1.49)
Prothrombin Time: 29.9 seconds — ABNORMAL HIGH (ref 11.6–15.2)

## 2011-06-27 MED ORDER — WARFARIN SODIUM 2 MG PO TABS
2.0000 mg | ORAL_TABLET | Freq: Once | ORAL | Status: AC
  Administered 2011-06-27: 2 mg via ORAL
  Filled 2011-06-27: qty 1

## 2011-06-27 NOTE — Progress Notes (Signed)
Patient ID: Maryruth Bun, male   DOB: February 23, 1941, 71 y.o.   MRN: [id number]  Subjective/Complaints: 06/27/11 No problems or complaints. Doing well this morning. Objective: Vital Signs: Blood pressure 123/72, pulse 86, temperature 98.3 F (36.8 C), temperature source Oral, resp. rate 16, height 6\' 1"  (1.854 m), weight 81.6 kg (179 lb 14.3 oz), SpO2 94.00%. No results found. Results for orders placed during the hospital encounter of 06/07/11 (from the past 72 hour(s))  PROTIME-INR     Status: Abnormal   Collection Time   06/25/11  6:30 AM      Component Value Range Comment   Prothrombin Time 28.7 (*) 11.6 - 15.2 (seconds)    INR 2.65 (*) 0.00 - 1.49    PROTIME-INR     Status: Abnormal   Collection Time   06/26/11  6:30 AM      Component Value Range Comment   Prothrombin Time 30.2 (*) 11.6 - 15.2 (seconds)    INR 2.83 (*) 0.00 - 1.49    PROTIME-INR     Status: Abnormal   Collection Time   06/27/11  6:00 AM      Component Value Range Comment   Prothrombin Time 29.9 (*) 11.6 - 15.2 (seconds)    INR 2.79 (*) 0.00 - 1.49      Physical Exam  Nursing note and vitals reviewed.  Constitutional: He appears well-developed.  Neck: Normal range of motion. No thyromegaly present.  Cardiovascular: Normal rate.  Pulmonary/Chest: Breath sounds normal. He has no wheezes.  Abdominal: He exhibits no distension. There is no tenderness. BS are normal Musculoskeletal: RLE with 2+ edema at ankle Neurological: Pt alert. Follows simple commnds, needing cueing at times. Orientation to person, place not time Right facial droop and tongue deviation. Speech dysarthric but Intelligible. . Patient is oriented x3 with good recall. Light touch intact RUE 4/5. RLE 3-/5. Mild right pronator drift and decreased FMC on right. DTR's 3+ on right. Toes up. Word finding deficits but able to communicate needs. Decreased STM Skin: Skin is warm and dry    Assessment/Plan: 1. Functional deficits secondary to L ACA infarct  with RLE paresis which require 3+ hours per day of interdisciplinary therapy in a comprehensive inpatient rehab setting. Physiatrist is providing close team supervision and 24 hour management of active medical problems listed below. Physiatrist and rehab team continue to assess barriers to discharge/monitor patient progress toward functional and medical goals. FIM: FIM - Bathing Bathing Steps Patient Completed: Chest;Right Arm;Left Arm;Abdomen;Front perineal area;Buttocks;Right upper leg;Left upper leg;Right lower leg (including foot);Left lower leg (including foot) Bathing: 5: Supervision: Safety issues/verbal cues  FIM - Upper Body Dressing/Undressing Upper body dressing/undressing steps patient completed: Thread/unthread right sleeve of pullover shirt/dresss;Thread/unthread left sleeve of pullover shirt/dress;Put head through opening of pull over shirt/dress;Pull shirt over trunk Upper body dressing/undressing: 5: Set-up assist to: Obtain clothing/put away FIM - Lower Body Dressing/Undressing Lower body dressing/undressing steps patient completed: Thread/unthread right underwear leg;Thread/unthread left underwear leg;Pull underwear up/down;Thread/unthread right pants leg;Thread/unthread left pants leg;Pull pants up/down;Don/Doff right sock;Don/Doff left sock Lower body dressing/undressing: 5: Set-up assist to: Obtain clothing  FIM - Toileting Toileting steps completed by patient: Adjust clothing prior to toileting;Performs perineal hygiene;Adjust clothing after toileting Toileting Assistive Devices: Grab bar or rail for support Toileting: 5: Supervision: Safety issues/verbal cues  FIM - Diplomatic Services operational officer Devices: Grab bars Toilet Transfers: 5-To toilet/BSC: Supervision (verbal cues/safety issues)  FIM - Banker Devices: HOB elevated Bed/Chair Transfer: 7: Supine >  Sit: No assist;5: Bed > Chair or W/C: Supervision (verbal  cues/safety issues);5: Chair or W/C > Bed: Supervision (verbal cues/safety issues)  FIM - Locomotion: Wheelchair Distance: 150 Locomotion: Wheelchair: 0: Activity did not occur FIM - Locomotion: Ambulation Locomotion: Ambulation Assistive Devices: Designer, industrial/product Ambulation/Gait Assistance: 5: Supervision Locomotion: Ambulation: 5: Travels 150 ft or more with supervision/safety issues  Comprehension Comprehension Mode: Auditory Comprehension: 4-Understands basic 75 - 89% of the time/requires cueing 10 - 24% of the time  Expression Expression Mode: Verbal Expression: 4-Expresses basic 75 - 89% of the time/requires cueing 10 - 24% of the time. Needs helper to occlude trach/needs to repeat words.  Social Interaction Social Interaction: 6-Interacts appropriately with others with medication or extra time (anti-anxiety, antidepressant).  Problem Solving Problem Solving: 3-Solves basic 50 - 74% of the time/requires cueing 25 - 49% of the time  Memory Memory: 2-Recognizes or recalls 25 - 49% of the time/requires cueing 51 - 75% of the time  Medical Problem List and Plan:  1. Left frontal lobe infarct as well as remote hemorrhagic infarct posterior left insular cortex and superior left temporal lobe, Stable. 2.. DVT Prophylaxis/Anticoagulation: Chronic Coumadin therapy. No bleeding.  3. Recurrent seizure. Continue Dilantin and Keppra therapy as per neurology services. Dilantin level 4/5 was supratherapeutic. recheck level monday.. Monitor for any further seizure activity.  4. Dysphagia. Followup per speech therapy.Pt advanced to D3 diet. Monitor for any signs of aspiration.  5. Atrial flutter with RVR. Continue Cardizem and follow up cardiology services. HR controlled at present.  6.  Loose stool:  Probiotic added. Perseverates on this at times. Cognitive component likely complicating mgmt of same 7.  Hypokalemia hx plus pedal edema - reduced KCL and started aldactone, K WNL. 8.  Dementia-  needs routine and cueing.  LOS (Days) 20 A FACE TO FACE EVALUATION WAS PERFORMED  IRETON,SUSAN C PA-C 06/27/2011, 8:49 AM   Agree as above    Rene Paci MD 06/27/11 9:51 AM  ROS

## 2011-06-27 NOTE — Progress Notes (Signed)
Occupational Therapy Session Note  Patient Details  Name: [name] MRN: [id number] Date of Birth: 01/14/1941  Today's Date: 06/27/2011 Time: 5784-6962 Time Calculation (min): 45 min  Short Term Goals: Week 3:  OT Short Term Goal 1 (Week 3): STG = LTG due to remaining LOS  Skilled Therapeutic Interventions/Progress Updates:  ADL-retraining at walk-in shower level with emphasis on safety awareness, sustained attention, sequencing, and problem-solving.  Patient ambulated to shower with RW and requested toileting (BM) prior to shower.   Transfers required verbal cues for safety and use of grab bars; patient moves quickly and more impulsively as session progresses.   Initially safety awareness appears consistent and appropriate however after 30 minutes of ADL patient is less attentive to his positioning and attempts risky procedures (standing while donning pants, despite cues suggesting seated dressing).   Patient continues to exhibit word-finding deficits with inconsistent response to feedback or requests for clarification.     Therapy Documentation Precautions:  Precautions Precautions: Fall Precaution Comments: Dys. 3 textures and thin liquids; significant RLE weakness Required Braces or Orthoses: No Restrictions Weight Bearing Restrictions: No  Pain: Pain Assessment Pain Assessment: No/denies pain Pain Score: 0-No pain  ADL: ADL Eating: Set up Grooming: Modified independent Where Assessed-Grooming: Sitting at sink Upper Body Bathing: Supervision/safety Where Assessed-Upper Body Bathing: Shower Lower Body Bathing: Supervision/safety Where Assessed-Lower Body Bathing: Shower Upper Body Dressing: Setup Where Assessed-Upper Body Dressing: Chair Lower Body Dressing: Minimal assistance Where Assessed-Lower Body Dressing: Chair Toileting: Contact guard Where Assessed-Toileting: Toilet;Bedside Commode (BSC over toilet) Toilet Transfer: Minimal assistance Toilet Transfer  Method: Ambulating (with RW) Toilet Transfer Equipment: Bedside commode;Grab bars Walk-In Shower Transfer: Minimal assistance;Minimal cueing Film/video editor Method: Ambulating (with RW) Astronomer: Transfer tub bench;Grab bars ADL Comments: Pt continues to require increased cues for safety secondary to impulsiveness and decreased STM.  Pt reqiures verbal cues for RLE placement and to lock w/c brakes prior to sit to stand.  See FIM for current functional status  Therapy/Group: Individual Therapy  Georgeanne Nim 06/27/2011, 12:25 PM

## 2011-06-27 NOTE — Progress Notes (Signed)
ANTICOAGULATION CONSULT NOTE - Follow Up Consult  Pharmacy Consult for coumadin Indication: atrial fibrillation  Vital Signs: Temp: 98.3 F (36.8 C) (04/07 0601) Temp src: Oral (04/07 0601) BP: 123/72 mmHg (04/07 0601) Pulse Rate: 86  (04/07 0601)  Labs:  Basename 06/27/11 0600 06/26/11 0630 06/25/11 0630  HGB -- -- --  HCT -- -- --  PLT -- -- --  APTT -- -- --  LABPROT 29.9* 30.2* 28.7*  INR 2.79* 2.83* 2.65*  HEPARINUNFRC -- -- --  CREATININE -- -- --  CKTOTAL -- -- --  CKMB -- -- --  TROPONINI -- -- --   Estimated Creatinine Clearance: 94.3 ml/min (by C-G formula based on Cr of 0.69).  Assessment: 71 y.o. M on warfarin for Afib and CVA with a therapeutic INR this a.m. that is stable.   Goal of Therapy:  INR 2-3  Plan:  1. Warfarin 2 mg x 1 dose at 1800 today 2. Will continue to monitor for any signs/symptoms of bleeding and will follow up with PT/INR in the a.m.   Estella Husk, Pharm.D., BCPS Clinical Pharmacist  Pager [phone] 06/27/2011, 11:28 AM

## 2011-06-28 LAB — BASIC METABOLIC PANEL
Calcium: 9.5 mg/dL (ref 8.4–10.5)
Creatinine, Ser: 0.74 mg/dL (ref 0.50–1.35)
GFR calc Af Amer: >90 mL/min (ref >90)

## 2011-06-28 LAB — PROTIME-INR
INR: 2.7 — ABNORMAL HIGH (ref 0.00–1.49)
Prothrombin Time: 29.1 seconds — ABNORMAL HIGH (ref 11.6–15.2)

## 2011-06-28 LAB — PHENYTOIN LEVEL, TOTAL: Phenytoin Lvl: 21.5 ug/mL — ABNORMAL HIGH (ref 10.0–20.0)

## 2011-06-28 MED ORDER — AMOXICILLIN-POT CLAVULANATE 875-125 MG PO TABS
1.0000 | ORAL_TABLET | Freq: Two times a day (BID) | ORAL | Status: DC
  Administered 2011-06-28 – 2011-06-29 (×3): 1 via ORAL
  Filled 2011-06-28 (×5): qty 1

## 2011-06-28 MED ORDER — PHENYTOIN 50 MG PO CHEW
150.0000 mg | CHEWABLE_TABLET | Freq: Every day | ORAL | Status: DC
  Administered 2011-06-28 – 2011-06-29 (×2): 150 mg via ORAL
  Filled 2011-06-28 (×4): qty 3

## 2011-06-28 MED ORDER — WARFARIN SODIUM 2 MG PO TABS
2.0000 mg | ORAL_TABLET | Freq: Once | ORAL | Status: AC
  Administered 2011-06-28: 2 mg via ORAL
  Filled 2011-06-28: qty 1

## 2011-06-28 NOTE — Progress Notes (Signed)
SLP Cancellation Note  Treatment cancelled today due to patient's refusal to participate.  Patient stated "I don't eat lunch and I want to go back to my room."  Myra Rude, M.S.,CCC-SLP Pager [phone] 06/28/2011, 1:10 PM

## 2011-06-28 NOTE — Progress Notes (Signed)
Speech Language Pathology Daily Session Note  Patient Details  Name: [name] MRN: [id number] Date of Birth: 05-05-1940  Today's Date: 06/28/2011 Time: 0900-0940 Time Calculation (min): 40 min  Short Term Goals:  SLP Short Term Goal 1 (Week 3): Patient will consume regular textures and thin liquids with supervision semantic cues and no overt s/s of aspiration SLP Short Term Goal 2 (Week 3): Patient will request help as needed with minimal assis semantic cues SLP Short Term Goal 3 (Week 3): Patient will follow 2-step directions during basic, familiar tasks with supervision assist semantc and visual cues SLP Short Term Goal 4 (Week 3): Patient will verbally express basic wants/needs with minimal assist sematic cues  Skilled Therapeutic Interventions: Treatment focus on family education in regards to language, cognition and swallowing function. Pt's daughter given handouts and verbalized/demonstrated understanding. Pt will d/c home tomorrow with daughter with 24/7 supervision.   Daily Session FIM:  Comprehension Comprehension: 5-Follows basic conversation/direction: With extra time/assistive device Expression Expression: 5-Expresses basic 90% of the time/requires cueing < 10% of the time. Social Interaction Social Interaction: 5-Interacts appropriately 90% of the time - Needs monitoring or encouragement for participation or interaction. Problem Solving Problem Solving: 4-Solves basic 75 - 89% of the time/requires cueing 10 - 24% of the time Memory Memory: 4-Recognizes or recalls 75 - 89% of the time/requires cueing 10 - 24% of the time  Therapy/Group: Individual Therapy  Jaeden Messer 06/28/2011, 4:26 PM

## 2011-06-28 NOTE — Progress Notes (Signed)
Physical Therapy Discharge Summary  Patient Details  Name: [name] MRN: [id number] Date of Birth: 01/05/1941  Today's Date: 06/28/2011 Time: 1000-1100 Time Calculation (min): 60 min  Patient has met 8 of 8 long term goals due to improved balance, improved postural control, increased strength, ability to compensate for deficits, functional use of  right lower extremity, improved attention, improved awareness and improved coordination.  Patient to discharge at Supervision using Rw level Supervision.   Patient's care partner is independent to provide the necessary physical assistance at discharge.  Reasons goals not met: NA all goals met Recommendation:  Patient will benefit from ongoing skilled PT services in home health setting to continue to advance safe functional mobility, address ongoing impairments in R hemiplegia, impaired motor control and timing, impaired memory, awareness, attention, safety and sequencing during functional mobility, impaired dynamic standing balance, balance strategies, gait, and minimize fall risk.  Equipment: RW, wheelchair   Reasons for discharge: treatment goals met  Patient/family agrees with progress made and goals achieved: Yes  PT Discharge  Cognition Memory: Impaired Decreased Short Term Memory: Functional basic Awareness: Impaired Awareness Impairment:  (safety)  Sensation Light Touch: Appears Intact Motor  Motor Motor: Hemiplegia;Abnormal postural alignment and control Motor - Skilled Clinical Observations: Decreased RT LE weakness/control  Mobility Bed Mobility Rolling Right: 7: Independent Right Sidelying to Sit: 7: Independent Supine to Sit: 7: Independent Sitting - Scoot to Edge of Bed: 7: Independent Transfers Sit to Stand: 5: Supervision Sit to Stand Details: Verbal cues for precautions/safety Stand to Sit: 5: Supervision Stand to Sit Details (indicate cue type and reason): Verbal cues for precautions/safety Stand  Pivot Transfers: 5: Supervision (RW) Locomotion  Ambulation Ambulation: Yes Ambulation/Gait Assistance: 5: Supervision Ambulation Distance (Feet): 150 Feet Assistive device: Rolling walker Ambulation/Gait Assistance Details: Tactile cues for posture;Verbal cues for precautions/safety;Verbal cues for safe use of DME/AE  Trunk/Postural Assessment  Cervical Assessment Cervical Assessment: Within Functional Limits Thoracic Assessment Thoracic Assessment: Within Functional Limits Lumbar Assessment Lumbar Assessment: Within Functional Limits Postural Control Postural Control: Deficits on evaluation (Flexed posture in gait without AD/decreased balance reaction)Impaired balance reactions  Balance Static Sitting Balance Static Sitting - Balance Support: No upper extremity supported Static Sitting - Level of Assistance: 7: Independent Dynamic Sitting Balance Dynamic Sitting - Level of Assistance: 7: Independent Static Standing Balance Static Standing - Level of Assistance: 5: Stand by assistance Dynamic Standing Balance Dynamic Standing - Level of Assistance: 5: Stand by assistance (RW) Extremity Assessment  RLE Assessment RLE Assessment: Exceptions to [facility] RLE Strength RLE Overall Strength: Deficits RLE Overall Strength Comments: 4/5 quads/hamstrings/3+ /5 hip flexion, 3+/5 ankle DF/PF- decreased timing LLE Assessment LLE Assessment: Within Functional Limits LLE Strength LLE Overall Strength: Within Functional Limits for tasks assessed  See FIM for current functional status  Edman Circle [facility] 06/28/2011, 5:17 PM

## 2011-06-28 NOTE — Progress Notes (Signed)
Occupational Therapy Note  Patient Details  Name: [name] MRN: [id number] Date of Birth: 12-20-1940 Today's Date: 06/28/2011  Pt declined 60 mins group therapy stating he was not hungry and did not order lunch for [employer].   Lavone Neri [name] 06/28/2011, 4:28 PM

## 2011-06-28 NOTE — Progress Notes (Signed)
Per State Regulation 482.30 This chart was reviewed for medical necessity with respect to the patient's Admission/Duration of stay. Pt participating and progressing in therapies. Pt's daughter in for education today. No barriers noted to d/c tomorrow. [name]                 Nurse Care Manager

## 2011-06-28 NOTE — Progress Notes (Signed)
Pt's daughter Enrique Sack requesting that she speak to PA or MD about dilantin dose orders. Order for today decreased dose to 150 mg daily. Pt and daughter want to know why change in dose now day before discharge, concerned may have seizures, Deatra Ina, PA made aware of pt and daughters request to talk to them about dilantin. No new orders given at this time, verbalized understanding of patient and daughter wanting to discuss above with him. Will continue to monitor. [place], Chelsy Parrales Elon Jester

## 2011-06-28 NOTE — Plan of Care (Signed)
Problem: RH BOWEL ELIMINATION Goal: RH STG MANAGE BOWEL WITH ASSISTANCE STG Manage Bowel with min. Assistance.  Outcome: Completed/Met Date Met:  06/28/11 Pt continent unless have to wait to be taken to bathroom or loose stools start before he can get into the bathroom, pt on florastar

## 2011-06-28 NOTE — Progress Notes (Signed)
Patient ID: [name], male   DOB: [birth year], 71 y.o.   MRN: [id number]  Subjective/Complaints: R cheek sore Objective: Vital Signs: Blood pressure 128/69, pulse 94, temperature 98.8 F (37.1 C), temperature source Oral, resp. rate 18, height 6\' 1"  (1.854 m), weight 81.6 kg (179 lb 14.3 oz), SpO2 95.00%. No results found. Results for orders placed during the hospital encounter of 06/07/11 (from the past 72 hour(s))  PROTIME-INR     Status: Abnormal   Collection Time   06/26/11  6:30 AM      Component Value Range Comment   Prothrombin Time 30.2 (*) 11.6 - 15.2 (seconds)    INR 2.83 (*) 0.00 - 1.49    PROTIME-INR     Status: Abnormal   Collection Time   06/27/11  6:00 AM      Component Value Range Comment   Prothrombin Time 29.9 (*) 11.6 - 15.2 (seconds)    INR 2.79 (*) 0.00 - 1.49    PROTIME-INR     Status: Abnormal   Collection Time   06/28/11  5:50 AM      Component Value Range Comment   Prothrombin Time 29.1 (*) 11.6 - 15.2 (seconds)    INR 2.70 (*) 0.00 - 1.49    PHENYTOIN LEVEL, TOTAL     Status: Abnormal   Collection Time   06/28/11  5:50 AM      Component Value Range Comment   Phenytoin Lvl 21.5 (*) 10.0 - 20.0 (ug/mL)     Physical Exam  Nursing note and vitals reviewed.  Constitutional: He appears well-developed.  HENT: PERRL, EOMI  Head: Normocephalic.  Neck: Normal range of motion. No thyromegaly present.  Cardiovascular: Normal rate.  Pulmonary/Chest: Breath sounds normal. He has no wheezes.  Abdominal: He exhibits no distension. There is no tenderness. BS are hyperactive Musculoskeletal: He exhibits no edema.  RLE with 1+ edema at ankle Neurological: He is alert.  Pt alert. Follows simple commnds, needing cueing at times. Orientation to person, place not timeSpeech dysarthric but Intelligible. . Patient is oriented x3 with good recall. e.Light touch intact RUE 4/5. RLE 3-/5. Mild right pronator drift and decreased FMC on right. DTR's 3+ on right. Toes up.  Word finding deficits but able to communicate needs. Decreased STM Skin: Skin is warm and dry  R parotid area mild swelling no erythema, + tenderness, gums are normal Assessment/Plan: 1. Functional deficits secondary to L ACA infarct with RLE paresis which require 3+ hours per day of interdisciplinary therapy in a comprehensive inpatient rehab setting. Physiatrist is providing close team supervision and 24 hour management of active medical problems listed below. Physiatrist and rehab team continue to assess barriers to discharge/monitor patient progress toward functional and medical goals. FIM: FIM - Bathing Bathing Steps Patient Completed: Chest;Right Arm;Left Arm;Abdomen;Front perineal area;Buttocks;Right upper leg;Left upper leg;Right lower leg (including foot);Left lower leg (including foot) Bathing: 5: Supervision: Safety issues/verbal cues  FIM - Upper Body Dressing/Undressing Upper body dressing/undressing steps patient completed: Thread/unthread right sleeve of pullover shirt/dresss;Thread/unthread left sleeve of pullover shirt/dress;Put head through opening of pull over shirt/dress;Pull shirt over trunk Upper body dressing/undressing: 5: Supervision: Safety issues/verbal cues FIM - Lower Body Dressing/Undressing Lower body dressing/undressing steps patient completed: Thread/unthread right underwear leg;Thread/unthread left underwear leg;Pull underwear up/down;Thread/unthread right pants leg;Thread/unthread left pants leg;Pull pants up/down;Don/Doff right sock;Don/Doff left sock Lower body dressing/undressing: 5: Supervision: Safety issues/verbal cues  FIM - Toileting Toileting steps completed by patient: Adjust clothing prior to toileting;Performs perineal hygiene;Adjust clothing after toileting  Toileting Assistive Devices: Grab bar or rail for support Toileting: 6: Assistive device: No helper  FIM - Diplomatic Services operational officer Devices: Grab bars;Bedside  commode;Walker Toilet Transfers: 5-To toilet/BSC: Supervision (verbal cues/safety issues);5-From toilet/BSC: Supervision (verbal cues/safety issues)  FIM - Press photographer Assistive Devices: Bed rails;HOB elevated;Walker Bed/Chair Transfer: 6: Supine > Sit: No assist;5: Bed > Chair or W/C: Supervision (verbal cues/safety issues)  FIM - Locomotion: Wheelchair Distance: 150 Locomotion: Wheelchair: 0: Activity did not occur FIM - Locomotion: Ambulation Locomotion: Ambulation Assistive Devices: Designer, industrial/product Ambulation/Gait Assistance: 5: Supervision Locomotion: Ambulation: 5: Travels 150 ft or more with supervision/safety issues  Comprehension Comprehension Mode: Auditory Comprehension: 5-Follows basic conversation/direction: With no assist  Expression Expression Mode: Verbal Expression: 5-Expresses basic needs/ideas: With no assist  Social Interaction Social Interaction: 6-Interacts appropriately with others with medication or extra time (anti-anxiety, antidepressant).  Problem Solving Problem Solving: 4-Solves basic 75 - 89% of the time/requires cueing 10 - 24% of the time  Memory Memory: 4-Recognizes or recalls 75 - 89% of the time/requires cueing 10 - 24% of the time  Medical Problem List and Plan:  1. 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 DVT Prophylaxis/Anticoagulation: Chronic Coumadin therapy. No bleeding/ following h and h  3. Recurrent seizure. Continue Dilantin and Keppra therapy as per neurology services. Dilantin level today is supratherapeutic but improved. Reduce dilantin to 150mg  qhs Monitor for any further seizure activity.  4. Dysphagia. Followup per speech therapy.Pt advanced to D3 diet. Monitor for any signs of aspiration  5. Atrial flutter with RVR. Continue Cardizem and follow up cardiology services. HR controlled at present.  6.  Loose stool:   Probiotic added. Perseverates on this at times. Cognitive component likely 7.  Hypokalemia plus pedal edema reduced KCL and started aldactone, Recheck bmet 8.  Dementia- needs routine and cueing 9.  Parotitis Augmentin x 10d, encourage fluids LOS (Days) 21 A FACE TO FACE EVALUATION WAS PERFORMED  Jadyn Brasher E 06/28/2011, 7:03 AM    Review of Systems  HENT: Negative for sore throat.   Cardiovascular: Negative for chest pain and leg swelling.  Gastrointestinal: Negative for nausea, vomiting, abdominal pain and diarrhea.  Neurological: Positive for focal weakness.  All other systems reviewed and are negative.

## 2011-06-28 NOTE — Progress Notes (Signed)
Occupational Therapy Session Note  Patient Details  Name: [name] MRN: [id number] Date of Birth: [birth year]  Today's Date: 06/28/2011 Time: 1000-1100 Time Calculation (min): 60 min  Short Term Goals: Week 3:  OT Short Term Goal 1 (Week 3): STG = LTG due to remaining LOS  Skilled Therapeutic Interventions:  Session to include patient and care partner education with patient's daughter, [name] for BADL tasks.  Focus session on safety and daughter providing the correct type cues to include to correct timing of the cues.  Patient continues to ask to be left alone during toileting and shower yet continues to be reminded that any time he is on his feet or about to get onto his feet, his daughter or the staff will need to be standing close by.  Patient verbalizes understanding yet continues to ask for privacy.  At end of session, patient's daughter verbalized and demonstrated understanding, verbalized new learning achieved in this session, and stated no further questions for occupational therapy.    Precautions:  Precautions Precautions: Fall Precaution Comments: Dys. 3 textures and thin liquids; significant RLE weakness Required Braces or Orthoses: No Restrictions Weight Bearing Restrictions: No Pain: No c/o pain  See FIM for current functional status  Therapy/Group: Individual Therapy and caregiver education with patient's daughter, [name] 06/28/2011, 12:35 PM

## 2011-06-28 NOTE — Plan of Care (Signed)
Problem: RH SAFETY Goal: RH STG DECREASED RISK OF FALL WITH ASSISTANCE STG Decreased Risk of Fall With min. Assistance.  Outcome: Completed/Met Date Met:  06/28/11 Will be home with daughter, in rehab on bed alarm and side rails up x 3, quick release in wheelchair

## 2011-06-28 NOTE — Progress Notes (Signed)
ANTICOAGULATION CONSULT NOTE - Follow Up Consult  Pharmacy Consult for coumadin Indication: atrial fibrillation  Vital Signs: Temp: 98.8 F (37.1 C) (04/08 0605) Temp src: Oral (04/08 0605) BP: 128/69 mmHg (04/08 0605) Pulse Rate: 94  (04/08 0605)  Labs:  Basename 06/28/11 1110 06/28/11 0550 06/27/11 0600 06/26/11 0630  HGB -- -- -- --  HCT -- -- -- --  PLT -- -- -- --  APTT -- -- -- --  LABPROT -- 29.1* 29.9* 30.2*  INR -- 2.70* 2.79* 2.83*  HEPARINUNFRC -- -- -- --  CREATININE 0.74 -- -- --  CKTOTAL -- -- -- --  CKMB -- -- -- --  TROPONINI -- -- -- --   Estimated Creatinine Clearance: 94.3 ml/min (by C-G formula based on Cr of 0.74).  Assessment: 71 y.o. M on warfarin for Afib and CVA with a therapeutic INR this a.m (INR 2.7, goal of 2-3). No CBC today, no bleeding noted.    Goal of Therapy:  INR 2-3  Plan:  1. Warfarin 2 mg x 1 dose at 1800 today 2. Will continue to monitor for any signs/symptoms of bleeding and will follow up with PT/INR in the a.m.   Georgina Pillion, PharmD, BCPS Clinical Pharmacist Pager: [phone] 06/28/2011 1:28 PM

## 2011-06-28 NOTE — Progress Notes (Signed)
Social Work Patient ID: [name], male   DOB: Feb 16, 1941, 71 y.o.   MRN: [id number] Daughter here to attend therapies with pt and learn his care.  She is pleased with the progress he has made. Discussed follow up therapies with both and decided to begin home health then transition to OP therapies Also rolling walker and wheelchair he needs.  Both were agreeable to the plans.  Plan to go back and forth Between pt's home and daughter's home.  Daughter aware pt will need 24 hour supervision level.  Ready for discharge tomorrow.

## 2011-06-28 NOTE — Progress Notes (Signed)
Physical Therapy Note  Patient Details  Name: ALAZAR CHERIAN MRN: [id number] Date of Birth: 07-Aug-1940 Today's Date: 06/28/2011  1300-1400 (60 minutes) Pain: no complaint of pain Focus of treatment : Caregiver ed (daughter)- ambulation, basic transfers including car, steps, floor to chair transfer, fall precautions Treatment: Daughter instructed in and redemonstrated safe guarding during ambulation on unit with RW close supervision on right; up/down 4-5 steps with one rail close supervision; simulated car transfer with RW close supervision with cues for safe hand placement; floor to mat transfer min assist. Discussed progression of ambulation and fall risks secondary to decreased single leg stance on right vs left. Daughter voiced understanding of safety issues.Gait 150 feet RW close supervision on right.   Natacia Chaisson,JIM 06/28/2011, 3:56 PM

## 2011-06-29 ENCOUNTER — Encounter: Payer: Self-pay | Admitting: Neurology

## 2011-06-29 DIAGNOSIS — Z5189 Encounter for other specified aftercare: Secondary | ICD-10-CM

## 2011-06-29 DIAGNOSIS — I6330 Cerebral infarction due to thrombosis of unspecified cerebral artery: Secondary | ICD-10-CM

## 2011-06-29 LAB — PROTIME-INR
INR: 2.57 — ABNORMAL HIGH (ref 0.00–1.49)
Prothrombin Time: 28 seconds — ABNORMAL HIGH (ref 11.6–15.2)

## 2011-06-29 MED ORDER — AMOXICILLIN-POT CLAVULANATE 875-125 MG PO TABS: 1.0000 | ORAL_TABLET | Freq: Two times a day (BID) | ORAL | Status: AC

## 2011-06-29 MED ORDER — DILTIAZEM HCL ER COATED BEADS 120 MG PO CP24: 120.0000 mg | ORAL_CAPSULE | Freq: Every day | ORAL | Status: DC

## 2011-06-29 MED ORDER — SPIRONOLACTONE 50 MG PO TABS: 50.0000 mg | ORAL_TABLET | Freq: Every day | ORAL | Status: DC

## 2011-06-29 MED ORDER — LEVETIRACETAM 100 MG/ML PO SOLN: 1750.0000 mg | Freq: Two times a day (BID) | ORAL | Status: DC

## 2011-06-29 MED ORDER — WARFARIN SODIUM 2 MG PO TABS: 2.0000 mg | ORAL_TABLET | Freq: Every day | ORAL | Status: DC

## 2011-06-29 MED ORDER — SACCHAROMYCES BOULARDII 250 MG PO CAPS: 250.0000 mg | ORAL_CAPSULE | Freq: Two times a day (BID) | ORAL | Status: AC

## 2011-06-29 MED ORDER — PHENYTOIN 50 MG PO CHEW: 150.0000 mg | CHEWABLE_TABLET | Freq: Every day | ORAL | Status: DC

## 2011-06-29 MED ORDER — PANTOPRAZOLE SODIUM 40 MG PO TBEC: 40.0000 mg | DELAYED_RELEASE_TABLET | Freq: Every day | ORAL | Status: DC

## 2011-06-29 NOTE — Plan of Care (Signed)
Pt discharged home with family. Discharge instructions provided by Harvel Ricks, PA. Pt escorted off unit in w/c with personal belonging by Nurse Tech Murlean Caller.

## 2011-06-29 NOTE — Progress Notes (Signed)
Speech Language Pathology Discharge Summary  Patient Details  Name: [name] MRN: [id number] Date of Birth: October 13, 1940  Today's Date: 06/29/2011  Patient has met 5 of 6 long term goals due to gains in swallow function, communication abilities and cognition.  Patient to discharge at overall Supervision-Min assist level.  Patient's care partner is independent to provide the necessary cognitive assistance at discharge.  Reasons goals not met: Patient's overall awareness, memory and ability to solve new problems as they relate to his current deficits requires more cuing for safety.    Recommendation:  Patient will benefit from ongoing skilled SLP services in home health setting to continue to advance functional skills in the area of functional communication and maximize cognitive function and overall independence.  Equipment: none  Reasons for discharge: treatment goals met and discharge from hospital  Patient/family agrees with progress made and goals achieved: Yes  See FIM for current functional status  Charlane Ferretti., CCC-SLP [phone]  [name] 06/29/2011, 9:52 AM

## 2011-06-29 NOTE — Progress Notes (Signed)
Patient ID: [name], male   DOB: [birth year], 71 y.o.   MRN: [id number]  Subjective/Complaints: R cheek soreness improved Objective: Vital Signs: Blood pressure 111/63, pulse 106, temperature 99 F (37.2 C), temperature source Oral, resp. rate 20, height 6\' 1"  (1.854 m), weight 80.7 kg (177 lb 14.6 oz), SpO2 96.00%. No results found. Results for orders placed during the hospital encounter of 06/07/11 (from the past 72 hour(s))  PROTIME-INR     Status: Abnormal   Collection Time   06/27/11  6:00 AM      Component Value Range Comment   Prothrombin Time 29.9 (*) 11.6 - 15.2 (seconds)    INR 2.79 (*) 0.00 - 1.49    PROTIME-INR     Status: Abnormal   Collection Time   06/28/11  5:50 AM      Component Value Range Comment   Prothrombin Time 29.1 (*) 11.6 - 15.2 (seconds)    INR 2.70 (*) 0.00 - 1.49    PHENYTOIN LEVEL, TOTAL     Status: Abnormal   Collection Time   06/28/11  5:50 AM      Component Value Range Comment   Phenytoin Lvl 21.5 (*) 10.0 - 20.0 (ug/mL)   BASIC METABOLIC PANEL     Status: Abnormal   Collection Time   06/28/11 11:10 AM      Component Value Range Comment   Sodium 136  135 - 145 (mEq/L)    Potassium 4.1  3.5 - 5.1 (mEq/L)    Chloride 99  96 - 112 (mEq/L)    CO2 27  19 - 32 (mEq/L)    Glucose, Bld 85  70 - 99 (mg/dL)    Dominguez 15  6 - 23 (mg/dL)    Creatinine, Ser 2.13  0.50 - 1.35 (mg/dL)    Calcium 9.5  8.4 - 10.5 (mg/dL)    GFR calc non Af Amer 90 (*) >90 (mL/min)    GFR calc Af Amer >90  >90 (mL/min)   PROTIME-INR     Status: Abnormal   Collection Time   06/29/11  6:05 AM      Component Value Range Comment   Prothrombin Time 28.0 (*) 11.6 - 15.2 (seconds)    INR 2.57 (*) 0.00 - 1.49      Physical Exam  Nursing note and vitals reviewed.  Constitutional: He appears well-developed.  HENT: PERRL, EOMI  Head: Normocephalic.  Neck: Normal range of motion. No thyromegaly present.  Cardiovascular: Normal rate.  Pulmonary/Chest: Breath sounds normal. He has  no wheezes.  Abdominal: He exhibits no distension. There is no tenderness. BS are hyperactive Musculoskeletal: He exhibits no edema.  RLE with 1+ edema at ankle Neurological: He is alert.  Pt alert. Follows simple commnds, needing cueing at times. Orientation to person, place not timeSpeech dysarthric but Intelligible. . Patient is oriented x3 with good recall. e.Light touch intact RUE 4/5. RLE 3-/5. Mild right pronator drift and decreased FMC on right. DTR's 3+ on right. Toes up. Word finding deficits but able to communicate needs. Decreased STM Skin: Skin is warm and dry  R parotid area mild swelling no erythema, +mild  tenderness, gums are normal Assessment/Plan: 1. Functional deficits secondary to L ACA infarct with RLE paresis stable for D/C     FIM: FIM - Bathing Bathing Steps Patient Completed: Chest;Right Arm;Left Arm;Abdomen;Front perineal area;Buttocks;Right upper leg;Left upper leg;Right lower leg (including foot);Left lower leg (including foot) Bathing: 5: Supervision: Safety issues/verbal cues  FIM - Upper Body Dressing/Undressing  Upper body dressing/undressing steps patient completed: Thread/unthread right sleeve of pullover shirt/dresss;Thread/unthread left sleeve of pullover shirt/dress;Put head through opening of pull over shirt/dress;Pull shirt over trunk Upper body dressing/undressing: 5: Supervision: Safety issues/verbal cues FIM - Lower Body Dressing/Undressing Lower body dressing/undressing steps patient completed: Thread/unthread right underwear leg;Thread/unthread left underwear leg;Pull underwear up/down;Thread/unthread right pants leg;Thread/unthread left pants leg;Pull pants up/down;Don/Doff right sock;Don/Doff left sock Lower body dressing/undressing: 5: Supervision: Safety issues/verbal cues  FIM - Toileting Toileting steps completed by patient: Adjust clothing prior to toileting;Performs perineal hygiene;Adjust clothing after toileting Toileting Assistive Devices:  Grab bar or rail for support Toileting: 5: Supervision: Safety issues/verbal cues  FIM - Diplomatic Services operational officer Devices: Elevated toilet seat;Grab bars;Walker Toilet Transfers: 5-To toilet/BSC: Supervision (verbal cues/safety issues);5-From toilet/BSC: Supervision (verbal cues/safety issues)  FIM - Banker Devices: Therapist, occupational: 7: Supine > Sit: No assist;7: Sit > Supine: No assist;5: Bed > Chair or W/C: Supervision (verbal cues/safety issues);5: Chair or W/C > Bed: Supervision (verbal cues/safety issues)  FIM - Locomotion: Wheelchair Distance: 150 Locomotion: Wheelchair: 5: Travels 150 ft or more: maneuvers on rugs and over door sills with supervision, cueing or coaxing FIM - Locomotion: Ambulation Locomotion: Ambulation Assistive Devices: Designer, industrial/product Ambulation/Gait Assistance: 5: Supervision Locomotion: Ambulation: 5: Travels 150 ft or more with supervision/safety issues  Comprehension Comprehension Mode: Auditory Comprehension: 5-Follows basic conversation/direction: With extra time/assistive device  Expression Expression Mode: Verbal Expression: 5-Expresses basic 90% of the time/requires cueing < 10% of the time.  Social Interaction Social Interaction: 5-Interacts appropriately 90% of the time - Needs monitoring or encouragement for participation or interaction.  Problem Solving Problem Solving: 4-Solves basic 75 - 89% of the time/requires cueing 10 - 24% of the time  Memory Memory: 4-Recognizes or recalls 75 - 89% of the time/requires cueing 10 - 24% of the time  Medical Problem List and Plan:  1. 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 DVT Prophylaxis/Anticoagulation: Chronic Coumadin therapy. No bleeding/ following h and h  3. Recurrent seizure. Continue Dilantin and Keppra therapy as per neurology  services. Dilantin level today is supratherapeutic but improved. Reduce dilantin to 150mg  qhs Monitor for any further seizure activity.  4. Dysphagia. Followup per speech therapy.Pt advanced to D3 diet. Monitor for any signs of aspiration  5. Atrial flutter with RVR. Continue Cardizem and follow up cardiology services. HR controlled at present.  6.  Loose stool:  Probiotic added. Perseverates on this at times. Cognitive component likely 7.  Hypokalemia plus pedal edema reduced KCL and started aldactone, Recheck bmet 8.  Dementia- needs routine and cueing 9.  Parotitis Augmentin x 10d, encourage fluids LOS (Days) 22 A FACE TO FACE EVALUATION WAS PERFORMED  Deshana Rominger E 06/29/2011, 7:20 AM    Review of Systems  HENT: Negative for sore throat.   Cardiovascular: Negative for chest pain and leg swelling.  Gastrointestinal: Negative for nausea, vomiting, abdominal pain and diarrhea.  Neurological: Positive for focal weakness.  All other systems reviewed and are negative.

## 2011-06-29 NOTE — Discharge Instructions (Signed)
Inpatient Rehab Discharge Instructions  Brent MULLANE Perkins Discharge date and time: No discharge date for patient encounter.   Activities/Precautions/ Functional Status: Activity: activity as tolerated Diet: regular diet Wound Care: none needed Functional status:  ___ No restrictions     ___ Walk up steps independently __x_ 24/7 supervision/assistance   __x_ Walk up steps with assistance ___ Intermittent supervision/assistance  ___ Bathe/dress independently ___ Walk with walker     ___ Bathe/dress with assistance ___ Walk Independently    ___ Shower independently __x_ Walk with assistance    _x STROKE/TIA DISCHARGE INSTRUCTIONS SMOKING Cigarette smoking nearly doubles your risk of having a stroke & is the single most alterable risk factor  If you smoke or have smoked in the last 12 months, you are advised to quit smoking for your health.  Most of the excess cardiovascular risk related to smoking disappears within a year of stopping.  Ask you doctor about anti-smoking medications  [place] Quit Line: 1-800-QUIT NOW  Free Smoking Cessation Classes ([phone]  CHOLESTEROL Know your levels; limit fat & cholesterol in your diet  Lab Results  Component Value Date   CHOL 156 06/04/2011   HDL 97 06/04/2011   LDLCALC 42 06/04/2011   TRIG 86 06/04/2011   CHOLHDL 1.6 06/04/2011      Many patients benefit from treatment even if their cholesterol is at goal.  Goal: Total Cholesterol less than 160  Goal:  LDL less than 100  Goal:  HDL greater than 40  Goal:  Triglycerides less than 150  BLOOD PRESSURE American Stroke Association blood pressure target is less that 120/80 mm/Hg  Your discharge blood pressure is:  BP: 128/69 mmHg  Monitor your blood pressure  Limit your salt and alcohol intake  Many individuals will require more than one medication for high blood pressure  DIABETES (A1c is a blood sugar average for last 3 months) Goal A1c is under 7% (A1c is blood sugar average for last  3 months)  Diabetes: No known diagnosis of diabetes    Lab Results  Component Value Date   HGBA1C 5.1 06/04/2011    Your A1c can be lowered with medications, healthy diet, and exercise.  Check your blood sugar as directed by your physician  Call your physician if you experience unexplained or low blood sugars.  PHYSICAL ACTIVITY/REHABILITATION Goal is 30 minutes at least 4 days per week    Activity decreases your risk of heart attack and stroke and makes your heart stronger.  It helps control your weight and blood pressure; helps you relax and can improve your mood.  Participate in a regular exercise program.  Talk with your doctor about the best form of exercise for you (dancing, walking, swimming, cycling).  DIET/WEIGHT Goal is to maintain a healthy weight  Your height is:  Height: 6\' 1"  (185.4 cm) Your current weight is: Weight: 81.6 kg (179 lb 14.3 oz) Your body Mass Index (BMI) is:  BMI (Calculated): 23.1   Following the type of diet specifically designed for you will help prevent another stroke.  Your goal Body Mass Index (BMI) is 19-24.  Healthy food habits can help reduce 3 risk factors for stroke:  High cholesterol, hypertension, and excess weight.     __ Shower with assistance ___ No alcohol     ___ Return to work/school ________  Special Instructions: Home health nurse to check INR April 11 results to [name] [phone] fax [phone] Home health nurse to check trough Dilantin level April  15 with results to Dr. Merlene Laughter [phone]  COMMUNITY REFERRALS UPON DISCHARGE:    Home Health:   PT, OT, SPT, RN    Agency:[facility] Phone: [phone] Date of last service:06/29/2011  Medical Equipment/Items Ordered:WHEELCHAIR, Guy Begin  Agency/Supplier: [facility]   [id number]   GENERAL COMMUNITY RESOURCES FOR PATIENT/FAMILY: Support Groups: CVA SUPPORT GROUP  IF QUESTIONS OR CONCERNS ARISE, PLEASE CONTACTDossie Der, LCSW                                                                                                                [id number]  My questions have been answered and I understand these instructions. I will adhere to these goals and the provided educational materials after my discharge from the hospital.  Patient/Caregiver Signature _______________________________ Date __________  Clinician Signature _______________________________________ Date __________  Please bring this form and your medication list with you to all your follow-up doctor's appointments.

## 2011-06-29 NOTE — Discharge Summary (Signed)
[name], GO NO.:  [id number]  MEDICAL RECORD NO.:  [id number]  LOCATION:  4028                         FACILITY:  [facility]  PHYSICIAN:  Erick Colace, M.D.DATE OF BIRTH:  1940-06-05  DATE OF ADMISSION:  06/07/2011 DATE OF DISCHARGE:  06/29/2011                              DISCHARGE SUMMARY   DISCHARGE DIAGNOSES: 1. Left frontal lobe infarction as well as remote hemorrhagic     infarction, posterior left insular cortex and superior left     temporal lobe. 2. Chronic Coumadin therapy. 3. Recurrent seizure. 4. Dysphagia, resolved. 5. Atrial flutter. 6.Parotitis HISTORY:  A 71 year old right-handed African American male with history of seizures as well as stroke, who was found down by family with seizure activity and right-sided weakness.  He was admitted on March 13 due to seizure.  It was unclear how long the patient had been seizing.  The patient had been maintained on Dilantin and Keppra.  His last noted seizure was 2009.  MRI of the brain showed acute nonhemorrhagic infarct in the medial left frontal lobe as well as stable remote hemorrhagic infarction of the posterior left insular cortex and superior left temporal lobe.  MRA of the neck showed moderate irregularity, proximal left common carotid artery, estimated 50-75%.  Severe narrowing, left subclavian distal to the vertebral origin, estimated 75-90%.  He did receive tPA.  Echocardiogram with ejection fraction of 65% without emboli or wall motion abnormality.  The patient was loaded with intravenous Dilantin.  Noted Dilantin level on admission of 3.4.  Family noted the patient was quite religious and taking his seizure medication, but had not seen this physician back in approximately 1 year for followup.  He presently remained on Keppra 1750 mg every 12 hours and Dilantin 400 mg at bedtime.  His latest Dilantin level was 15.3 on March 14.  EEG showed presence of intermittent left frontal  and left paracentral sharp wave suggestive of underlying focus of cortical irritability, no seizure activity noted.  The patient was seen by Neurology Services, placed on Aggrenox for seizure prophylaxis. Currently maintained on dysphagia I thin liquid diet after modified barium swallow.  On March 15, developed abrupt onset of tachycardia with telemetry revealing atrial flutter with RVR.  Noted troponin 0.05.  No indication of myocardial injury.  Follow up Cardiology service Dr. Johney Frame, placed on Cardizem for rate control and recommendations for Coumadin therapy.  He is admitted for comprehensive rehab program.  PAST MEDICAL HISTORY:  See discharge diagnoses.  SOCIAL HISTORY:  He lives alone, receives help from his daughter and close family.  FUNCTIONAL HISTORY PRIOR TO ADMISSION:  Independent, retired.  He does drive.  FUNCTIONAL STATUS UPON ADMISSION TO REHAB SERVICES:  Minimal assist to roll to the right, max assist sit to stand, total assist stand to sit, ambulation was not yet tested.  PHYSICAL EXAMINATION:  VITAL SIGNS:  Blood pressure 111/75, pulse 91, respirations 20, temperature 97.4. GENERAL:  This was an alert male, well developed. NEUROLOGIC:  Follows simple commands, needed some cuing at times.  Right facial droop and tongue deviation.  Speech was dysarthric, but intelligible.  He had some mild verbal and motor apraxia.  May  have a mild receptive expressive language deficit.  He was oriented x3. LUNGS:  Clear to auscultation. CARDIAC:  Rate controlled. ABDOMEN:  Soft, nontender.  Good bowel sounds.  REHABILITATION HOSPITAL COURSE:  The patient was admitted to inpatient rehab services with therapies initiated on a 3-hour daily basis consisting of physical therapy, occupational therapy, speech therapy, and rehabilitation nursing.  The following issues were addressed during the patient's rehabilitation stay.  Pertaining to Mr. [name]'s left frontal lobe infarct as well  as remote hemorrhagic infarct, posterior left insular cortex and superior left temporal lobe remained stable with ongoing therapies.  He was now on chronic Coumadin therapy due to atrial flutter with a RVR per Cardiology Services, Dr. Johney Frame with latest INR of 2.57.  He would follow up with [facility] for ongoing monitoring of his Coumadin.  His blood pressures remained well controlled.  He was on Cardizem 120 mg daily.  The patient also continued without Aldactone 50 mg daily.  Noted recurrent seizures, he was now on Dilantin and Keppra therapy.  He continued with Keppra 1750 mg p.o. b.i.d.  His Dilantin was adjusted due to supratherapeutic levels of 27.5, improved to 21.5 on June 28, 2011.  His Dilantin was decreased to 150 mg daily.  He never did have any further seizure activity noted. He would need to follow up Dilantin level in approximately 1 week. Appointments had been made to see his primary MD, Dr. Merlene Laughter on July 06, 2011, at 2:15 p.m.  and could have followup Dilantin level at that time.  A home health nurse had been arranged to withdraw his blood on Monday, the 15th and then follow up with Dr. Pete Glatter.  His diet had been advanced to regular, which he was tolerating well.  The patient did develop some mild parotitis, placed on Augmentin x10 days.  The patient received weekly collaborative interdisciplinary team conferences to discuss estimated length of stay, family teaching, and any barriers to his discharge.  He was receiving routine toileting, had occasional urgency of bladder.  He was supervision bathing, minimal assist, lower body dressing, minimal assist transfers with stand pivot and ambulation with a rolling walker, supervision to minimal assist overall for his mobility needing max assist for cues.  He was able to communicate his needs on a very limited basis.  His family would provide the necessary supervision at home after family teaching  completed.  LATEST LABS:  Showed an INR of 2.7 on April 9.  Latest chemistries with a sodium 136, potassium 4.1, BUN 15, creatinine 0.74.  Latest Dilantin level on April 9 of 21.5.  Latest hemoglobin of 11, hematocrit 33.  DISCHARGE MEDICATIONS: 1. Augmentin 875 mg every 12 hours until July 08, 2011 and stop. 2. Cardizem 120 mg daily. 3. Keppra 1750 mg twice daily. 4. Protonix 40 mg daily. 5. Dilantin 150 mg daily. 6. Aldactone 50 mg daily. 7. Coumadin 2 mg daily, established with dose adjusted for INR of 2.0     to 3.0.  DIET:  Mechanical soft.  SPECIAL INSTRUCTIONS:  Home health nurse to check Dilantin level on April 15, results to Dr. Merlene Laughter.  Home health nurse to check INR on April 11, results to [facility] at [phone], fax [phone]. Home health therapies had been arranged.  The patient should follow up with Dr. Merlene Laughter on April 16 at 2:15 p.m.; Dr. Hillis Range, call for appointment; Dr. Lyman Speller, Neurology of which should call for appointment.  Dr. Claudette Laws, Aug 05, 2011 at 10:00 a.m.     Mariam Dollar, P.A.   ______________________________ Erick Colace, [name]    DA/MEDQ  D:  06/29/2011  T:  06/29/2011  Job:  [id number]  cc:   Hal T. Stoneking, [name] Hillis Range, MD Carmell Austria, MD Erick Colace, [name]

## 2011-06-29 NOTE — Progress Notes (Signed)
Social Work Discharge Note Discharge Note  The overall goal for the admission was met for:   Discharge location: Yes-HOME WITH DAUGHTER WHO CAN PROVIDE 24 HOUR SUPERVISION  Length of Stay: Yes-22 DAYS  Discharge activity level: Yes-SUPERVISION/MIN LEVEL  Home/community participation: Yes  Services provided included: MD, RD, PT, OT, SLP, RN, CM, TR, Pharmacy and SW  Financial Services: Medicare and Private Insurance: BCBS-SECONDARY  Follow-up services arranged: Home Health: [facility]- PT, OT, SPT,RN, DME: [facility], [facility] and Patient/Family has no preference for HH/DME agencies  Comments (or additional information): BEGIN HOME HEALTH THEN TRANSITION TO OUTPT.  Patient/Family verbalized understanding of follow-up arrangements: Yes  Individual responsible for coordination of the follow-up plan: [facility]  Confirmed correct DME delivered: Lucy Chris 06/29/2011    Lucy Chris

## 2011-06-29 NOTE — Progress Notes (Signed)
Occupational Therapy Discharge Summary  Patient Details  Name: [name] MRN: [id number] Date of Birth: 03-Nov-1940  Today's Date: 06/29/2011    Patient has met 8 of 8 long term goals due to improved balance, postural control, ability to compensate for deficits and functional use of  RIGHT lower extremity.  Patient to discharge at overall Supervision level.  Patient's care partner is independent to provide the necessary cognitive assistance at discharge.  Pt will require 24/7 supervision from daughter during all mobility secondary to pt's decreased safety awareness, impulsivity, and STM deficits.    Reasons goals not met: NA  Recommendation:  Patient will benefit from ongoing skilled OT services in home health setting to continue to advance functional skills in the area of BADL.  Equipment: RW  Reasons for discharge: treatment goals met and discharge from hospital  Patient/family agrees with progress made and goals achieved: Yes  OT Discharge ADL ADL Eating: Set up Grooming: Modified independent Where Assessed-Grooming: Sitting at sink Upper Body Bathing: Supervision/safety Where Assessed-Upper Body Bathing: Shower Lower Body Bathing: Supervision/safety Where Assessed-Lower Body Bathing: Shower Upper Body Dressing: Independent Where Assessed-Upper Body Dressing: Chair Lower Body Dressing: Supervision/safety Where Assessed-Lower Body Dressing: Chair Toileting: Supervision/safety Where Assessed-Toileting: Teacher, adult education: Close supervision Toilet Transfer Method: Ambulating (with RW) Acupuncturist: Acupuncturist: Close supervision Film/video editor Method: Ambulating (with RW) Astronomer: Transfer tub bench;Grab bars ADL Comments: Pt continues to require cues for safety with bathing and dressing secondary to impulsiveness and decreased STM.  Pt requires cues for RLE placement prior to sit <> stand and to lock  w/c brakes Vision/Perception  Vision - History Baseline Vision: Wears glasses only for reading Patient Visual Report: No change from baseline Vision - Assessment Eye Alignment: Within Functional Limits Perception Perception: Within Functional Limits Praxis Praxis: Intact  Cognition Overall Cognitive Status: Impaired Sensation Sensation Light Touch: Appears Intact Hot/Cold: Appears Intact Proprioception: Impaired by gross assessment (decreased awareness of RLE) Coordination Gross Motor Movements are Fluid and Coordinated: No Fine Motor Movements are Fluid and Coordinated: Yes Extremity/Trunk Assessment RUE Assessment RUE Assessment: Within Functional Limits LUE Assessment LUE Assessment: Within Functional Limits  See FIM for current functional status  [name] 06/29/2011, 12:26 PM

## 2011-06-29 NOTE — Progress Notes (Addendum)
Nutrition Follow-up  Upgraded to Regular on 4/4. Intake appropriate and improved ranging from 75 - 100%, on average eating 90% of meals.   Diet Order:  Regular  Meds: Scheduled Meds:   . amoxicillin-clavulanate  1 tablet Oral Q12H  . diltiazem  120 mg Oral Daily  . levETIRAcetam  1,750 mg Oral BID  . pantoprazole  40 mg Oral Q1200  . phenytoin  150 mg Oral Daily  . potassium chloride  10 mEq Oral BID  . saccharomyces boulardii  250 mg Oral BID  . simethicone  160 mg Oral QID  . spironolactone  50 mg Oral Daily  . warfarin  2 mg Oral ONCE-1800  . Warfarin - Pharmacist Dosing Inpatient   Does not apply q1800   Continuous Infusions:  PRN Meds:.acetaminophen, alum & mag hydroxide-simeth, diphenoxylate-atropine, ondansetron (ZOFRAN) IV, ondansetron  Labs:  CMP     Component Value Date/Time   NA 136 06/28/2011 1110   K 4.1 06/28/2011 1110   CL 99 06/28/2011 1110   CO2 27 06/28/2011 1110   GLUCOSE 85 06/28/2011 1110   BUN 15 06/28/2011 1110   CREATININE 0.74 06/28/2011 1110   CALCIUM 9.5 06/28/2011 1110   PROT 5.5* 06/08/2011 0650   ALBUMIN 3.4* 06/18/2011 1130   AST 47* 06/08/2011 0650   ALT 46 06/08/2011 0650   ALKPHOS 69 06/08/2011 0650   BILITOT 0.4 06/08/2011 0650   GFRNONAA 90* 06/28/2011 1110   GFRAA >90 06/28/2011 1110     Intake/Output Summary (Last 24 hours) at 06/29/11 0956 Last data filed at 06/28/11 2336  Gross per 24 hour  Intake    240 ml  Output    401 ml  Net   -161 ml   Weight Status:  80.7 kg, wt down 8.7 kg x 1 week - trending back down to admit weight of 79.3 kg  Estimated needs:  1700 - 1800 kcal, 75 - 85 grams protein  Nutrition Dx:  none  Goal:  Pt to consume >/= 75% of meals on average. Met.  Intervention:  Continue current interventions.  Monitor:  Weights, labs, PO intake, I/O's  Adair Laundry Pager #:  [phone]

## 2011-06-30 ENCOUNTER — Ambulatory Visit: Payer: Self-pay | Admitting: Cardiology

## 2011-06-30 DIAGNOSIS — I69991 Dysphagia following unspecified cerebrovascular disease: Secondary | ICD-10-CM | POA: Diagnosis not present

## 2011-06-30 DIAGNOSIS — I69998 Other sequelae following unspecified cerebrovascular disease: Secondary | ICD-10-CM | POA: Diagnosis not present

## 2011-06-30 DIAGNOSIS — I69959 Hemiplegia and hemiparesis following unspecified cerebrovascular disease affecting unspecified side: Secondary | ICD-10-CM | POA: Diagnosis not present

## 2011-06-30 DIAGNOSIS — G40802 Other epilepsy, not intractable, without status epilepticus: Secondary | ICD-10-CM | POA: Diagnosis not present

## 2011-06-30 DIAGNOSIS — R1312 Dysphagia, oropharyngeal phase: Secondary | ICD-10-CM | POA: Diagnosis not present

## 2011-06-30 DIAGNOSIS — Z7901 Long term (current) use of anticoagulants: Secondary | ICD-10-CM

## 2011-06-30 DIAGNOSIS — I4892 Unspecified atrial flutter: Secondary | ICD-10-CM

## 2011-06-30 LAB — POCT INR: INR: 2.1

## 2011-07-01 DIAGNOSIS — G40802 Other epilepsy, not intractable, without status epilepticus: Secondary | ICD-10-CM | POA: Diagnosis not present

## 2011-07-01 DIAGNOSIS — I69998 Other sequelae following unspecified cerebrovascular disease: Secondary | ICD-10-CM | POA: Diagnosis not present

## 2011-07-01 DIAGNOSIS — R1312 Dysphagia, oropharyngeal phase: Secondary | ICD-10-CM | POA: Diagnosis not present

## 2011-07-01 DIAGNOSIS — I69991 Dysphagia following unspecified cerebrovascular disease: Secondary | ICD-10-CM | POA: Diagnosis not present

## 2011-07-01 DIAGNOSIS — I69959 Hemiplegia and hemiparesis following unspecified cerebrovascular disease affecting unspecified side: Secondary | ICD-10-CM | POA: Diagnosis not present

## 2011-07-05 ENCOUNTER — Telehealth: Payer: Self-pay | Admitting: Physical Medicine & Rehabilitation

## 2011-07-05 DIAGNOSIS — I69959 Hemiplegia and hemiparesis following unspecified cerebrovascular disease affecting unspecified side: Secondary | ICD-10-CM | POA: Diagnosis not present

## 2011-07-05 DIAGNOSIS — I69998 Other sequelae following unspecified cerebrovascular disease: Secondary | ICD-10-CM | POA: Diagnosis not present

## 2011-07-05 DIAGNOSIS — R1312 Dysphagia, oropharyngeal phase: Secondary | ICD-10-CM | POA: Diagnosis not present

## 2011-07-05 DIAGNOSIS — I69991 Dysphagia following unspecified cerebrovascular disease: Secondary | ICD-10-CM | POA: Diagnosis not present

## 2011-07-05 DIAGNOSIS — G40802 Other epilepsy, not intractable, without status epilepticus: Secondary | ICD-10-CM | POA: Diagnosis not present

## 2011-07-05 NOTE — Telephone Encounter (Signed)
Verbal given to David  

## 2011-07-05 NOTE — Telephone Encounter (Signed)
PT frequency 1wk/1, 3wk/2, 2wk/2.  Verbal ok. [phone]

## 2011-07-06 DIAGNOSIS — I6990 Unspecified sequelae of unspecified cerebrovascular disease: Secondary | ICD-10-CM | POA: Diagnosis not present

## 2011-07-06 DIAGNOSIS — L639 Alopecia areata, unspecified: Secondary | ICD-10-CM | POA: Diagnosis not present

## 2011-07-06 DIAGNOSIS — I4891 Unspecified atrial fibrillation: Secondary | ICD-10-CM | POA: Diagnosis not present

## 2011-07-06 DIAGNOSIS — R197 Diarrhea, unspecified: Secondary | ICD-10-CM | POA: Diagnosis not present

## 2011-07-06 DIAGNOSIS — I10 Essential (primary) hypertension: Secondary | ICD-10-CM | POA: Diagnosis not present

## 2011-07-07 ENCOUNTER — Ambulatory Visit: Payer: Self-pay | Admitting: Internal Medicine

## 2011-07-07 DIAGNOSIS — I4892 Unspecified atrial flutter: Secondary | ICD-10-CM

## 2011-07-07 DIAGNOSIS — G40802 Other epilepsy, not intractable, without status epilepticus: Secondary | ICD-10-CM | POA: Diagnosis not present

## 2011-07-07 DIAGNOSIS — R1312 Dysphagia, oropharyngeal phase: Secondary | ICD-10-CM | POA: Diagnosis not present

## 2011-07-07 DIAGNOSIS — I69991 Dysphagia following unspecified cerebrovascular disease: Secondary | ICD-10-CM | POA: Diagnosis not present

## 2011-07-07 DIAGNOSIS — I69959 Hemiplegia and hemiparesis following unspecified cerebrovascular disease affecting unspecified side: Secondary | ICD-10-CM | POA: Diagnosis not present

## 2011-07-07 DIAGNOSIS — I69998 Other sequelae following unspecified cerebrovascular disease: Secondary | ICD-10-CM | POA: Diagnosis not present

## 2011-07-07 DIAGNOSIS — Z7901 Long term (current) use of anticoagulants: Secondary | ICD-10-CM

## 2011-07-07 LAB — POCT INR: INR: 1.3

## 2011-07-08 DIAGNOSIS — R1312 Dysphagia, oropharyngeal phase: Secondary | ICD-10-CM | POA: Diagnosis not present

## 2011-07-08 DIAGNOSIS — G40802 Other epilepsy, not intractable, without status epilepticus: Secondary | ICD-10-CM | POA: Diagnosis not present

## 2011-07-08 DIAGNOSIS — I69959 Hemiplegia and hemiparesis following unspecified cerebrovascular disease affecting unspecified side: Secondary | ICD-10-CM | POA: Diagnosis not present

## 2011-07-08 DIAGNOSIS — I69998 Other sequelae following unspecified cerebrovascular disease: Secondary | ICD-10-CM | POA: Diagnosis not present

## 2011-07-08 DIAGNOSIS — I69991 Dysphagia following unspecified cerebrovascular disease: Secondary | ICD-10-CM | POA: Diagnosis not present

## 2011-07-12 DIAGNOSIS — I69959 Hemiplegia and hemiparesis following unspecified cerebrovascular disease affecting unspecified side: Secondary | ICD-10-CM | POA: Diagnosis not present

## 2011-07-12 DIAGNOSIS — I69991 Dysphagia following unspecified cerebrovascular disease: Secondary | ICD-10-CM | POA: Diagnosis not present

## 2011-07-12 DIAGNOSIS — R1312 Dysphagia, oropharyngeal phase: Secondary | ICD-10-CM | POA: Diagnosis not present

## 2011-07-12 DIAGNOSIS — I69998 Other sequelae following unspecified cerebrovascular disease: Secondary | ICD-10-CM | POA: Diagnosis not present

## 2011-07-12 DIAGNOSIS — G40802 Other epilepsy, not intractable, without status epilepticus: Secondary | ICD-10-CM | POA: Diagnosis not present

## 2011-07-14 ENCOUNTER — Ambulatory Visit: Payer: Self-pay | Admitting: Cardiology

## 2011-07-14 DIAGNOSIS — R1312 Dysphagia, oropharyngeal phase: Secondary | ICD-10-CM | POA: Diagnosis not present

## 2011-07-14 DIAGNOSIS — I4892 Unspecified atrial flutter: Secondary | ICD-10-CM

## 2011-07-14 DIAGNOSIS — Z7901 Long term (current) use of anticoagulants: Secondary | ICD-10-CM

## 2011-07-14 DIAGNOSIS — I69991 Dysphagia following unspecified cerebrovascular disease: Secondary | ICD-10-CM | POA: Diagnosis not present

## 2011-07-14 DIAGNOSIS — G40802 Other epilepsy, not intractable, without status epilepticus: Secondary | ICD-10-CM | POA: Diagnosis not present

## 2011-07-14 DIAGNOSIS — I69959 Hemiplegia and hemiparesis following unspecified cerebrovascular disease affecting unspecified side: Secondary | ICD-10-CM | POA: Diagnosis not present

## 2011-07-14 DIAGNOSIS — I69998 Other sequelae following unspecified cerebrovascular disease: Secondary | ICD-10-CM | POA: Diagnosis not present

## 2011-07-15 ENCOUNTER — Other Ambulatory Visit (INDEPENDENT_AMBULATORY_CARE_PROVIDER_SITE_OTHER): Payer: Medicare Other

## 2011-07-15 ENCOUNTER — Encounter: Payer: Self-pay | Admitting: Neurology

## 2011-07-15 ENCOUNTER — Ambulatory Visit (INDEPENDENT_AMBULATORY_CARE_PROVIDER_SITE_OTHER): Payer: Medicare Other | Admitting: Neurology

## 2011-07-15 VITALS — BP 132/70 | HR 100 | Wt 174.0 lb

## 2011-07-15 DIAGNOSIS — R569 Unspecified convulsions: Secondary | ICD-10-CM

## 2011-07-15 DIAGNOSIS — I6350 Cerebral infarction due to unspecified occlusion or stenosis of unspecified cerebral artery: Secondary | ICD-10-CM | POA: Diagnosis not present

## 2011-07-15 DIAGNOSIS — I639 Cerebral infarction, unspecified: Secondary | ICD-10-CM

## 2011-07-15 LAB — COMPREHENSIVE METABOLIC PANEL
CO2: 30 mEq/L (ref 19–32)
Calcium: 8.7 mg/dL (ref 8.4–10.5)
Chloride: 102 mEq/L (ref 96–112)
GFR: 160.93 mL/min (ref >60.00)
Glucose, Bld: 90 mg/dL (ref 70–99)
Sodium: 141 mEq/L (ref 135–145)
Total Bilirubin: 0.5 mg/dL (ref 0.3–1.2)
Total Protein: 6.9 g/dL (ref 6.0–8.3)

## 2011-07-15 LAB — CBC WITH DIFFERENTIAL/PLATELET
Basophils Relative: 0 % (ref 0.0–3.0)
MCHC: 32.5 g/dL (ref 30.0–36.0)
MCV: 93 fl (ref 78.0–100.0)
Monocytes Absolute: 0.5 10*3/uL (ref 0.1–1.0)
Neutrophils Relative %: 63.2 % (ref 43.0–77.0)
Platelets: 270 10*3/uL (ref 150.0–400.0)
RBC: 3.6 Mil/uL — ABNORMAL LOW (ref 4.22–5.81)
RDW: 14.7 % — ABNORMAL HIGH (ref 11.5–14.6)

## 2011-07-15 LAB — PROTIME-INR: Prothrombin Time: 13.2 s — ABNORMAL HIGH (ref 10.2–12.4)

## 2011-07-15 MED ORDER — LEVETIRACETAM 500 MG PO TABS: 1500.0000 mg | ORAL_TABLET | Freq: Two times a day (BID) | ORAL | Status: DC

## 2011-07-15 MED ORDER — LEVETIRACETAM 250 MG PO TABS: 250.0000 mg | ORAL_TABLET | Freq: Two times a day (BID) | ORAL | Status: DC

## 2011-07-15 NOTE — Patient Instructions (Signed)
Go to the basement to have your labs drawn today.  . 

## 2011-07-15 NOTE — Progress Notes (Signed)
Dear Dr. Pete Glatter,  Thank you for having me see [name] in consultation today at [facility] for his problem with ischemic stroke and seizures.  As you may recall, he is a 71 y.o. year old male with a history of alcohol abuse,  posterior left insular cortex stroke and resultant focal epilepsy who had another stroke 06/01/2011 who was also found to have atrial flutter.  When he had the stroke, he also had frequent convulsions that same day.  The ischemic stroke was left ACA territory and caused focal left leg weakness. He was in hospital for about 1 week, and then moved to rehab in the hospital.  He was started on coumadin during that time.  With respect to his initial seizures these started after his stroke in 2006.  He was maintained on 1500mg  of Keppra BID and 300mg  qhs of Dilantin.  He had about 5 seizures between 2006 and 2009 but once they got his medications perfected he did not have any further seizures until the convulsions that occurred with his second stroke in 2013.  He was initially maintained on Aggrenox with his first stroke, but then switched to aspirin 325mg .  His diagnosis of atrial flutter was not made until the 2013 stroke when he was switched to Coumadin.  During his recent hospitalization his Keppra was increased to 1750 bid.  Interestingly, his Dilantin level just before discharge was 21 and he was switched from 375 daily of dilantin to 150mg  daily.  He has not had seizures since his release on 4/9.  His original seizures are described as speech arrest and behavioral arrest with a prolonged post ictal phase of difficulty speaking for at least a day.  His daughter says it takes about 2 days for him to get back to normal.  He denies any problems with the AEDs.  Currently, they are trying to optimize his Coumadin levels.  Past Medical History  Diagnosis Date  . Seizures   . Stroke     Past Surgical History  Procedure Date  . No surgical history 06/2011      History   Social History  . Marital Status: Divorced    Spouse Name: N/A    Number of Children: N/A  . Years of Education: N/A   Social History Main Topics  . Smoking status: Former Smoker    Quit date: 07/15/1966  . Smokeless tobacco: Never Used  . Alcohol Use: No     Quit greater than 5 years ago  . Drug Use: No  . Sexually Active: None   Other Topics Concern  . None   Social History Narrative   Lives alone.  Retired from Research officer, political party    Family History  Problem Relation Age of Onset  . Hypertension      Current Outpatient Prescriptions on File Prior to Visit  Medication Sig Dispense Refill  . diltiazem (CARDIZEM CD) 120 MG 24 hr capsule Take 1 capsule (120 mg total) by mouth daily.  30 capsule  1  . levETIRAcetam (KEPPRA) 100 MG/ML solution Take 17.5 mLs (1,750 mg total) by mouth 2 (two) times daily.  473 mL  1  . phenytoin (DILANTIN) 50 MG tablet Chew 3 tablets (150 mg total) by mouth daily.  90 tablet  1  . spironolactone (ALDACTONE) 50 MG tablet Take 1 tablet (50 mg total) by mouth daily.  30 tablet  1  . warfarin (COUMADIN) 2 MG tablet Take 1 tablet (2 mg total) by mouth  daily.  30 tablet  1    No Known Allergies    ROS:  13 systems were reviewed and are notable for mile weakness of his right leg.  All other review of systems are unremarkable.   Examination:  Filed Vitals:   07/15/11 1228  BP: 132/70  Pulse: 100  Weight: 174 lb (78.926 kg)     In general, well appearing older man.  Cardiovascular: The patient has a regular rate and rhythm and no carotid bruits.  Fundoscopy:  Disks are flat. Vessel caliber within normal limits.  Mental status:   The patient is oriented to person, place and time. Recent and remote memory are intact. Attention span and concentration are normal. Language including repetition, naming, following commands are intact. Fund of knowledge of current and historical events, as well as vocabulary are normal.  Cranial  Nerves: Pupils are equally round and reactive to light. Visual fields full to confrontation. Extraocular movements are intact without nystagmus. Facial sensation and muscles of mastication are intact. Muscles of facial expression are symmetric. Hearing intact to bilateral finger rub. Tongue protrusion, uvula, palate midline.  Shoulder shrug intact  Motor:  The patient has normal bulk and tone, no pronator drift.  There are no adventitious movements.  5/5 muscle strength bilaterally except 4+/5 right hip flexor.  Reflexes:   Biceps  Triceps Brachioradialis Knee Ankle  Right 2+  2+  2+   2+ 0  Left  2+  2+  2+   2+ 0  Toes down  Coordination:  Normal finger to nose.  No dysdiadokinesia.  Sensation is intact to decreased temp sense in his feet, but vibration relatively intact as is position.  Gait and Station are normal.  Romberg is negative   Impression/Recs: 1.  Focal epilepsy secondary to left insular stroke.  He likely had breakthrough convulsions due to the new ischemic stroke and not sub therapeutic dosing given how well he did in the past.  I am going to check a level today of his PHT.  I suspect it will be low.  While I don't usually chase levels, given his was on 300mg  qhs previously and did well, I would favor that dose.  Of course, coumadin can interact with PHT, usually causing one to require more PHT.  We will adjust the PHT tomorrow after we get a level and then have them call us to recheck a level when they have his coumadin stabilized. 2.  Ischemic stroke - likely from atrial flutter.  Agree with coumadin.  He of course has a huge atherosclerotic burden in his proximal arteries which is a potential other source.   We will see the patient back in 3 months.  Thank you for having [place] see [name] in consultation.  Feel free to contact me with any questions.  Lupita Raider Modesto Charon, MD [facility], [place] [address] [place], [place] [address] Phone: [phone] Fax: [phone].

## 2011-07-16 ENCOUNTER — Other Ambulatory Visit: Payer: Self-pay

## 2011-07-16 ENCOUNTER — Other Ambulatory Visit: Payer: Self-pay | Admitting: Neurology

## 2011-07-16 ENCOUNTER — Telehealth: Payer: Self-pay | Admitting: Neurology

## 2011-07-16 DIAGNOSIS — I69991 Dysphagia following unspecified cerebrovascular disease: Secondary | ICD-10-CM | POA: Diagnosis not present

## 2011-07-16 DIAGNOSIS — R1312 Dysphagia, oropharyngeal phase: Secondary | ICD-10-CM | POA: Diagnosis not present

## 2011-07-16 DIAGNOSIS — I69959 Hemiplegia and hemiparesis following unspecified cerebrovascular disease affecting unspecified side: Secondary | ICD-10-CM | POA: Diagnosis not present

## 2011-07-16 DIAGNOSIS — G40802 Other epilepsy, not intractable, without status epilepticus: Secondary | ICD-10-CM | POA: Diagnosis not present

## 2011-07-16 DIAGNOSIS — I69998 Other sequelae following unspecified cerebrovascular disease: Secondary | ICD-10-CM | POA: Diagnosis not present

## 2011-07-16 MED ORDER — PHENYTOIN 50 MG PO CHEW: CHEWABLE_TABLET | ORAL | Status: DC

## 2011-07-16 NOTE — Telephone Encounter (Signed)
Have him increase it back to his old dose of 300 daily.  If they need a new prescription for 3 100tabs then we will need to call that in.  thx.

## 2011-07-16 NOTE — Telephone Encounter (Signed)
Picked up a call from the patient's daughter, [name]. She was calling for the results of her dad's lab work. The Dilantin level was 6.8. (not a trough level). He is currently on 150 mg every am. She asked that we call her back with any med changes at [phone] (mobile #).  **Dr. Modesto Charon, please advise.

## 2011-07-16 NOTE — Telephone Encounter (Signed)
Called and spoke with the patient's daughter, Enrique Sack. Information given as directed by Dr.wong below. She states they do not need a new script at this time as they have 100 mg tablets left from his earlier dosage. She will call when refill needed. No other questions or concerns voiced at this time.

## 2011-07-19 ENCOUNTER — Ambulatory Visit (INDEPENDENT_AMBULATORY_CARE_PROVIDER_SITE_OTHER): Payer: Medicare Other | Admitting: Internal Medicine

## 2011-07-19 ENCOUNTER — Encounter: Payer: Self-pay | Admitting: Internal Medicine

## 2011-07-19 ENCOUNTER — Telehealth: Payer: Self-pay | Admitting: Neurology

## 2011-07-19 VITALS — BP 116/80 | HR 85 | Resp 18 | Ht 72.0 in | Wt 171.0 lb

## 2011-07-19 DIAGNOSIS — G40802 Other epilepsy, not intractable, without status epilepticus: Secondary | ICD-10-CM | POA: Diagnosis not present

## 2011-07-19 DIAGNOSIS — I69991 Dysphagia following unspecified cerebrovascular disease: Secondary | ICD-10-CM | POA: Diagnosis not present

## 2011-07-19 DIAGNOSIS — I6350 Cerebral infarction due to unspecified occlusion or stenosis of unspecified cerebral artery: Secondary | ICD-10-CM | POA: Diagnosis not present

## 2011-07-19 DIAGNOSIS — R1312 Dysphagia, oropharyngeal phase: Secondary | ICD-10-CM | POA: Diagnosis not present

## 2011-07-19 DIAGNOSIS — I4892 Unspecified atrial flutter: Secondary | ICD-10-CM

## 2011-07-19 DIAGNOSIS — I69998 Other sequelae following unspecified cerebrovascular disease: Secondary | ICD-10-CM | POA: Diagnosis not present

## 2011-07-19 DIAGNOSIS — I10 Essential (primary) hypertension: Secondary | ICD-10-CM | POA: Diagnosis not present

## 2011-07-19 DIAGNOSIS — I69959 Hemiplegia and hemiparesis following unspecified cerebrovascular disease affecting unspecified side: Secondary | ICD-10-CM | POA: Diagnosis not present

## 2011-07-19 NOTE — Assessment & Plan Note (Signed)
Pt reports good BP control in the past and does not wish to take spironolactone. I will stop this medicine today.  He is encouraged to have home health follow his BP at home.  Follow-up with Dr Pete Glatter.

## 2011-07-19 NOTE — Telephone Encounter (Signed)
Pt's daughter wants to know if and when she can get her father's blood work done again to recheck his Dilantin level after the increase in dosage. He is currently taking 250 mg a day of the Dilantin. She states that the new dose seems to be working, but the dose was just changed on Friday so she wants to know how soon is too soon to get the level checked again.

## 2011-07-19 NOTE — Telephone Encounter (Signed)
Left a message for the patient's daughter, Enrique Sack to return my call.

## 2011-07-19 NOTE — Telephone Encounter (Signed)
yes.  we can check a new dilantin level in about 14 days.  please do a free and total.  thx.

## 2011-07-19 NOTE — Assessment & Plan Note (Signed)
Given CVA and seizure 3/13, I have again reinforced the importance that he refrain from driving for 6 months with the patient and his daughter today.  They express understanding.

## 2011-07-19 NOTE — Progress Notes (Signed)
PCP:  Ginette Otto, MD, MD  The patient presents today for routine cardiology followup.  He was recently seen by me in consultation 3/13 during his hospitalization for CVA and seizure disorder (refer to my consult note) at which time he had rapidly conducting atrial flutter.  He has made slow but steady improvement.  His daughter feels that he is near his prior baseline.  He remains reasonably active.  Today, he denies symptoms of  chest pain, shortness of breath,  lower extremity edema (above baseline) dizziness, presyncope, syncope, or further neurologic sequela.  He continues to have rare "fluttering" of his chest, typically lasting only several minutes.  He is unaware of any episodes more than 3-4 minutes.  The patient feels that he is tolerating medications without difficulties and is otherwise without complaint today.   Past Medical History  Diagnosis Date  . Seizures   . Stroke   . Pancreatitis   . Hiatal hernia   . HTN (hypertension)     pt denies h/o HTN though it appears he was started on spironolactone during his hospitalization 3/13  . Typical atrial flutter 3/13   Past Surgical History  Procedure Date  . No surgical history 06/2011    Current Outpatient Prescriptions  Medication Sig Dispense Refill  . diltiazem (CARDIZEM CD) 120 MG 24 hr capsule Take 1 capsule (120 mg total) by mouth daily.  30 capsule  1  . levETIRAcetam (KEPPRA) 100 MG/ML solution Take 17.5 mLs (1,750 mg total) by mouth 2 (two) times daily.  473 mL  1  . phenytoin (DILANTIN) 100 MG ER capsule Take 200 mg by mouth daily.       . phenytoin (DILANTIN) 50 MG tablet Chew 6 daily (total 300mg )  180 tablet  3  . warfarin (COUMADIN) 2 MG tablet Take 1 tablet (2 mg total) by mouth daily.  30 tablet  1  . levETIRAcetam (KEPPRA) 250 MG tablet Take 1 tablet (250 mg total) by mouth 2 (two) times daily.  60 tablet  3  . levETIRAcetam (KEPPRA) 500 MG tablet Take 3 tablets (1,500 mg total) by mouth 2 (two) times  daily.  180 tablet  3  . pantoprazole (PROTONIX) 40 MG tablet         No Known Allergies  History   Social History  . Marital Status: Divorced    Spouse Name: N/A    Number of Children: N/A  . Years of Education: N/A   Occupational History  . Not on file.   Social History Main Topics  . Smoking status: Former Smoker    Quit date: 07/15/1966  . Smokeless tobacco: Never Used  . Alcohol Use: No     Quit greater than 5 years ago  . Drug Use: No  . Sexually Active: Not on file   Other Topics Concern  . Not on file   Social History Narrative   Lives alone.  Retired from Research officer, political party    Family History  Problem Relation Age of Onset  . Hypertension      Physical Exam: Filed Vitals:   07/19/11 1521  BP: 116/80  Pulse: 85  Resp: 18  Height: 6' (1.829 m)  Weight: 171 lb (77.565 kg)    GEN- The patient is well appearing, alert and oriented x 3 today.   Head- normocephalic, atraumatic Eyes-  Sclera clear, conjunctiva pink Ears- hearing intact Oropharynx- clear Lungs- Clear to ausculation bilaterally, normal work of breathing Heart- Regular rate and rhythm, no murmurs, rubs  or gallops, PMI not laterally displaced GI- soft, NT, ND, + BS Extremities- no clubbing, cyanosis, trace ankle edema  ekg today reveals sinus rhythm 85 bpm, RsR', PR 192, QTc 437, otherwise normal ekg  Assessment and Plan:

## 2011-07-19 NOTE — Assessment & Plan Note (Signed)
Presently controlled with diltiazem Continue coumadin for stroke prevention  The patient and his daughter are clear that they would like to avoid ablation. No new recs at this time.

## 2011-07-19 NOTE — Patient Instructions (Signed)
Your physician wants you to follow-up in: 3 months with Dr Johney Frame  Your physician has recommended you make the following change in your medication:  1) Stop Spironalactone

## 2011-07-19 NOTE — Telephone Encounter (Signed)
[name], patient's daughter returned my call. She tells me that on last Friday, she said her dad's previous dose of Dilantin was 300 mg a day. She then discovered over the weekend that he was previously on only 200 mg a day. She states she was worried as she had given Dr. Modesto Charon the wrong information and thus he had put her dad on the wrong dose of Dilantin. I told her he was fine at the 300 mg as his level was only 6.8 (not a trough level at that). I told her I would let Dr. Modesto Charon know this new information. She is also wondering if and when he will need another Dilantin level drawn. I told her if he wanted to get another one, it is usually done after 10 to 12 days after the dose change. I let her know that I will ask about that as well. [name] was fine with this plan. **Dr. Modesto Charon, please advise Dilantin dose and future Dilantin level. Thank you.

## 2011-07-20 ENCOUNTER — Ambulatory Visit: Payer: Self-pay | Admitting: Cardiology

## 2011-07-20 ENCOUNTER — Other Ambulatory Visit: Payer: Self-pay | Admitting: Neurology

## 2011-07-20 DIAGNOSIS — I69998 Other sequelae following unspecified cerebrovascular disease: Secondary | ICD-10-CM | POA: Diagnosis not present

## 2011-07-20 DIAGNOSIS — R1312 Dysphagia, oropharyngeal phase: Secondary | ICD-10-CM | POA: Diagnosis not present

## 2011-07-20 DIAGNOSIS — Z7901 Long term (current) use of anticoagulants: Secondary | ICD-10-CM

## 2011-07-20 DIAGNOSIS — I69991 Dysphagia following unspecified cerebrovascular disease: Secondary | ICD-10-CM | POA: Diagnosis not present

## 2011-07-20 DIAGNOSIS — R569 Unspecified convulsions: Secondary | ICD-10-CM

## 2011-07-20 DIAGNOSIS — G40802 Other epilepsy, not intractable, without status epilepticus: Secondary | ICD-10-CM | POA: Diagnosis not present

## 2011-07-20 DIAGNOSIS — I69959 Hemiplegia and hemiparesis following unspecified cerebrovascular disease affecting unspecified side: Secondary | ICD-10-CM | POA: Diagnosis not present

## 2011-07-20 DIAGNOSIS — I4892 Unspecified atrial flutter: Secondary | ICD-10-CM

## 2011-07-20 LAB — POCT INR: INR: 1.2

## 2011-07-20 NOTE — Telephone Encounter (Signed)
[name] returned my call. Instructed to continue the Dilantin 300 mg daily and to have another level drawn in 2 weeks. Is aware of what a trough level is. No other questions or concerns at this time.

## 2011-07-21 ENCOUNTER — Other Ambulatory Visit: Payer: Self-pay | Admitting: *Deleted

## 2011-07-21 ENCOUNTER — Encounter: Payer: Self-pay | Admitting: Physical Medicine & Rehabilitation

## 2011-07-21 DIAGNOSIS — I69959 Hemiplegia and hemiparesis following unspecified cerebrovascular disease affecting unspecified side: Secondary | ICD-10-CM | POA: Diagnosis not present

## 2011-07-21 DIAGNOSIS — G40802 Other epilepsy, not intractable, without status epilepticus: Secondary | ICD-10-CM | POA: Diagnosis not present

## 2011-07-21 DIAGNOSIS — I69991 Dysphagia following unspecified cerebrovascular disease: Secondary | ICD-10-CM | POA: Diagnosis not present

## 2011-07-21 DIAGNOSIS — I69998 Other sequelae following unspecified cerebrovascular disease: Secondary | ICD-10-CM | POA: Diagnosis not present

## 2011-07-21 DIAGNOSIS — R1312 Dysphagia, oropharyngeal phase: Secondary | ICD-10-CM | POA: Diagnosis not present

## 2011-07-21 MED ORDER — WARFARIN SODIUM 2 MG PO TABS: 2.0000 mg | ORAL_TABLET | ORAL | Status: DC

## 2011-07-26 ENCOUNTER — Encounter: Payer: Medicare Other | Attending: Physical Medicine & Rehabilitation

## 2011-07-26 ENCOUNTER — Ambulatory Visit (INDEPENDENT_AMBULATORY_CARE_PROVIDER_SITE_OTHER): Payer: Medicare Other

## 2011-07-26 ENCOUNTER — Ambulatory Visit (HOSPITAL_BASED_OUTPATIENT_CLINIC_OR_DEPARTMENT_OTHER): Payer: Medicare Other | Admitting: Physical Medicine & Rehabilitation

## 2011-07-26 ENCOUNTER — Encounter: Payer: Self-pay | Admitting: Physical Medicine & Rehabilitation

## 2011-07-26 VITALS — BP 129/78 | HR 93 | Resp 16 | Ht 72.0 in | Wt 171.6 lb

## 2011-07-26 DIAGNOSIS — G40909 Epilepsy, unspecified, not intractable, without status epilepticus: Secondary | ICD-10-CM | POA: Diagnosis not present

## 2011-07-26 DIAGNOSIS — I6350 Cerebral infarction due to unspecified occlusion or stenosis of unspecified cerebral artery: Secondary | ICD-10-CM

## 2011-07-26 DIAGNOSIS — Z7901 Long term (current) use of anticoagulants: Secondary | ICD-10-CM | POA: Diagnosis not present

## 2011-07-26 DIAGNOSIS — Z8673 Personal history of transient ischemic attack (TIA), and cerebral infarction without residual deficits: Secondary | ICD-10-CM | POA: Insufficient documentation

## 2011-07-26 DIAGNOSIS — I4892 Unspecified atrial flutter: Secondary | ICD-10-CM

## 2011-07-26 NOTE — Progress Notes (Signed)
Subjective:    Patient ID: Maryruth Bun, male    DOB: Oct 24, 1940, 71 y.o.   MRN: [id number]  HPI A 71 year old right-handed African American male with history  of seizures as well as stroke, who was found down by family with seizure  activity and right-sided weakness. He was admitted on March 13 due to  seizure. It was unclear how long the patient had been seizing. The  patient had been maintained on Dilantin and Keppra. His last noted  seizure was 2009. MRI of the brain showed acute nonhemorrhagic infarct  in the medial left frontal lobe as well as stable remote hemorrhagic  infarction of the posterior left insular cortex and superior left  temporal lobe. MRA of the neck showed moderate irregularity, proximal  left common carotid artery, estimated 50-75%. Severe narrowing, left  subclavian distal to the vertebral origin, estimated 75-90%. He did  receive tPA. Echocardiogram with ejection fraction of 65% without  emboli or wall motion abnormality. The patient was loaded with  intravenous Dilantin. Noted Dilantin level on admission of 3.4.  Finished up home health therapy last week. Was just receiving physical therapy. OT and speech evaluations were done but no ongoing therapy was recommended.  Sought Dr. Modesto Charon from neurology for seizure control. No further seizures. No falls at home. Taking medications as prescribed. No alcohol intake.  Pain Inventory Average Pain 0 Pain Right Now 0 My pain is n/a  In the last 24 hours, has pain interfered with the following? General activity 0 Relation with others 0 Enjoyment of life 0 What TIME of day is your pain at its worst? n/a Sleep (in general) Fair  Pain is worse with: n/a Pain improves with: n/a Relief from Meds n/a  Mobility Do you have any goals in this area?  no  Function retired Do you have any goals in this area?  no  Neuro/Psych No problems in this area  Prior Studies Any changes since last visit?   no  Physicians involved in your care Any changes since last visit?  no      Review of Systems  All other systems reviewed and are negative.       Objective:   Physical Exam  Constitutional: He is oriented to person, place, and time.  Neurological: He is alert and oriented to person, place, and time. He displays no tremor. He exhibits normal muscle tone. Gait abnormal. Coordination normal.    Ambulation is with wide base of support but no evidence of toe drag or knee instability Motor strength is 4/5 in the l right quad, ankle dorsiflexor otherwise 5/5 in both upper extremities and left lower extremity Sensation is intact in both upper and lower extremities. Coordination is normal on finger nose to finger testing in both upper extremities. Heel-to-shin testing is intact in bilateral lower tremor these.    Assessment & Plan:  1. Left frontal infarct along with seizure disorder. He's had good functional recovery. He is doing appropriate stroke prevention as well seizure prevention. He is following up with neurology as well as primary care physician. I do not think he needs any further physical or occupational therapy. I do think he should continue his home exercise program. I will see him on a when necessary basis Patient had questions regarding his care including the medical necessity of his 2 MRIs. I explained that one was an MRI of the brain and the other one was an MR a. I also explained that these could not always be  done at the same time because of the length of scanning time that is required and patient tolerance of this. Over half the 25 minute visit was spent counseling, coordinating care, and education.

## 2011-07-26 NOTE — Patient Instructions (Signed)
Seizure, Adult A seizure happens when there is uncontrolled activity in the brain. Most seizures cause either the whole body to shake or just one part of the body to shake. The shaking is called a convulsion. Other seizures cause the body or a part of the body to tense up.  HOME CARE   Keep all follow-up visits.   Take medicines as told by your doctor.   Do not swim until your doctor says it is okay.   Do not drive until your doctor says it is okay.   Make sure your friends, loved ones, and coworkers know to do the following things if you have a seizure:   Lower you gently to the ground.   Keep the area around you free of things that might fall or things that you might grab.   Avoid putting anything in or near your mouth.   Call your local emergency services (911 in [place]  GET HELP RIGHT AWAY IF:   You have confusion or feel dazed.   You feel sleepy.  MAKE SURE YOU:   Understand these instructions.   Will watch your condition.   Will get help right away if you are not doing well or get worse.  Document Released: 08/25/2007 Document Revised: 02/25/2011 Document Reviewed: 02/24/2011 [facility] Patient Information 2012 [place], [place].

## 2011-07-29 ENCOUNTER — Other Ambulatory Visit: Payer: Self-pay | Admitting: Neurology

## 2011-07-29 MED ORDER — PHENYTOIN SODIUM EXTENDED 100 MG PO CAPS: ORAL_CAPSULE | ORAL | Status: DC

## 2011-07-29 NOTE — Telephone Encounter (Signed)
Pt needs refill on dilantin 100 mg, tid to [facility] on [address]. Please call pt's daughter at number listed to confirm the rx has been sent in.

## 2011-07-29 NOTE — Telephone Encounter (Signed)
Spoke with Enrique Sack, patient's daughter to verify the Dilantin dose. He takes Phenytoin Ext 100 mg three capsules every morning. I told her I would refill this today by 5 pm. No other questions.

## 2011-08-02 ENCOUNTER — Ambulatory Visit (INDEPENDENT_AMBULATORY_CARE_PROVIDER_SITE_OTHER): Payer: Medicare Other | Admitting: *Deleted

## 2011-08-02 DIAGNOSIS — I4892 Unspecified atrial flutter: Secondary | ICD-10-CM | POA: Diagnosis not present

## 2011-08-02 DIAGNOSIS — Z7901 Long term (current) use of anticoagulants: Secondary | ICD-10-CM | POA: Diagnosis not present

## 2011-08-02 MED ORDER — WARFARIN SODIUM 2.5 MG PO TABS: ORAL_TABLET | ORAL | Status: DC

## 2011-08-03 ENCOUNTER — Other Ambulatory Visit: Payer: Medicare Other

## 2011-08-03 DIAGNOSIS — R569 Unspecified convulsions: Secondary | ICD-10-CM | POA: Diagnosis not present

## 2011-08-04 LAB — PHENYTOIN LEVEL, TOTAL: Phenytoin Lvl: 15.8 ug/mL (ref 10.0–20.0)

## 2011-08-09 ENCOUNTER — Ambulatory Visit (INDEPENDENT_AMBULATORY_CARE_PROVIDER_SITE_OTHER): Payer: Medicare Other

## 2011-08-09 DIAGNOSIS — Z7901 Long term (current) use of anticoagulants: Secondary | ICD-10-CM

## 2011-08-09 DIAGNOSIS — I4892 Unspecified atrial flutter: Secondary | ICD-10-CM

## 2011-08-09 LAB — PHENYTOIN LEVEL, FREE AND TOTAL
Phenytoin Bound: 11.1 mg/L
Phenytoin, Free: 1.7 mg/L (ref 1.0–2.0)
Phenytoin, Total: 12.8 mg/L (ref 10.0–20.0)

## 2011-08-09 LAB — POCT INR: INR: 2.5

## 2011-08-11 ENCOUNTER — Telehealth: Payer: Self-pay | Admitting: Neurology

## 2011-08-11 NOTE — Telephone Encounter (Signed)
Pt's daughter would like blood test results, particularly for Dilantin levels.

## 2011-08-12 NOTE — Telephone Encounter (Signed)
Dilantin level was 15, which is a good level.

## 2011-08-12 NOTE — Telephone Encounter (Signed)
Spoke with Enrique Sack. Informed Dilantin level 15 and wnl. No additional concerns voiced at this time.

## 2011-08-19 ENCOUNTER — Ambulatory Visit (INDEPENDENT_AMBULATORY_CARE_PROVIDER_SITE_OTHER): Payer: Medicare Other

## 2011-08-19 DIAGNOSIS — Z7901 Long term (current) use of anticoagulants: Secondary | ICD-10-CM

## 2011-08-19 DIAGNOSIS — I4892 Unspecified atrial flutter: Secondary | ICD-10-CM

## 2011-08-19 LAB — POCT INR: INR: 2.3

## 2011-09-02 ENCOUNTER — Ambulatory Visit (INDEPENDENT_AMBULATORY_CARE_PROVIDER_SITE_OTHER): Payer: Medicare Other | Admitting: *Deleted

## 2011-09-02 DIAGNOSIS — I4892 Unspecified atrial flutter: Secondary | ICD-10-CM

## 2011-09-02 DIAGNOSIS — Z7901 Long term (current) use of anticoagulants: Secondary | ICD-10-CM | POA: Diagnosis not present

## 2011-09-02 DIAGNOSIS — I10 Essential (primary) hypertension: Secondary | ICD-10-CM | POA: Diagnosis not present

## 2011-09-02 DIAGNOSIS — I4891 Unspecified atrial fibrillation: Secondary | ICD-10-CM | POA: Diagnosis not present

## 2011-09-09 ENCOUNTER — Telehealth: Payer: Self-pay

## 2011-09-09 NOTE — Telephone Encounter (Signed)
Pt's daughter, Enrique Sack, called.  Pt has been c/o right sided nagging headache for the past few days.  Not real bad, just enough to bother him.  He has not taken anything for it.  Just sort of continually there.  She did not have much more information to offer.  Prefers for [place] to talk with her.  He has a f/u appt 7/25.

## 2011-09-09 NOTE — Telephone Encounter (Signed)
he can try Aleve 200mg  bid.

## 2011-09-10 NOTE — Telephone Encounter (Signed)
Pt's daughter aware.

## 2011-09-16 ENCOUNTER — Ambulatory Visit (INDEPENDENT_AMBULATORY_CARE_PROVIDER_SITE_OTHER): Payer: Medicare Other

## 2011-09-16 DIAGNOSIS — I4892 Unspecified atrial flutter: Secondary | ICD-10-CM

## 2011-09-16 DIAGNOSIS — Z7901 Long term (current) use of anticoagulants: Secondary | ICD-10-CM | POA: Diagnosis not present

## 2011-09-16 LAB — POCT INR: INR: 3.4

## 2011-10-06 ENCOUNTER — Other Ambulatory Visit: Payer: Self-pay | Admitting: *Deleted

## 2011-10-06 MED ORDER — WARFARIN SODIUM 2.5 MG PO TABS: ORAL_TABLET | ORAL | Status: DC

## 2011-10-11 ENCOUNTER — Ambulatory Visit (INDEPENDENT_AMBULATORY_CARE_PROVIDER_SITE_OTHER): Payer: Medicare Other

## 2011-10-11 DIAGNOSIS — Z7901 Long term (current) use of anticoagulants: Secondary | ICD-10-CM | POA: Diagnosis not present

## 2011-10-11 DIAGNOSIS — I4892 Unspecified atrial flutter: Secondary | ICD-10-CM | POA: Diagnosis not present

## 2011-10-13 ENCOUNTER — Ambulatory Visit: Payer: Federal, State, Local not specified - PPO | Admitting: Neurology

## 2011-10-14 ENCOUNTER — Ambulatory Visit: Payer: Federal, State, Local not specified - PPO | Admitting: Neurology

## 2011-10-18 ENCOUNTER — Ambulatory Visit (INDEPENDENT_AMBULATORY_CARE_PROVIDER_SITE_OTHER): Payer: Medicare Other | Admitting: Internal Medicine

## 2011-10-18 ENCOUNTER — Encounter: Payer: Self-pay | Admitting: Internal Medicine

## 2011-10-18 VITALS — BP 140/78 | HR 87 | Resp 18 | Ht 72.0 in | Wt 160.1 lb

## 2011-10-18 DIAGNOSIS — I4892 Unspecified atrial flutter: Secondary | ICD-10-CM | POA: Diagnosis not present

## 2011-10-18 MED ORDER — DILTIAZEM HCL ER COATED BEADS 120 MG PO CP24: 120.0000 mg | ORAL_CAPSULE | Freq: Every day | ORAL | Status: DC

## 2011-10-18 NOTE — Assessment & Plan Note (Signed)
Presently controlled with diltiazem Continue coumadin for stroke prevention  The patient and his daughter are clear that they would like to avoid ablation. No new recs at this time.  Return in 12 months

## 2011-10-18 NOTE — Progress Notes (Signed)
PCP:  Ginette Otto, MD  The patient presents today for routine cardiology followup.  He was recently seen by me in consultation 3/13 during his hospitalization for CVA and seizure disorder (refer to my consult note) at which time he had rapidly conducting atrial flutter.  He has made slow but steady improvement.  His daughter feels that he is near his prior baseline.  He has done well since his last visit.  He is unaware of any further arrhythmias.  Today, he denies symptoms of  chest pain, shortness of breath,  lower extremity edema (above baseline) dizziness, presyncope, syncope, or further neurologic sequela.  He continues to have rare "fluttering" of his chest, typically lasting only several minutes.  He is unaware of any episodes more than 3-4 minutes.   The patient feels that he is tolerating medications without difficulties and is otherwise without complaint today.   Past Medical History  Diagnosis Date  . Seizures   . Stroke   . Pancreatitis   . Hiatal hernia   . HTN (hypertension)     pt denies h/o HTN though it appears he was started on spironolactone during his hospitalization 3/13  . Typical atrial flutter 3/13   Past Surgical History  Procedure Date  . No surgical history 06/2011    Current Outpatient Prescriptions  Medication Sig Dispense Refill  . diltiazem (CARDIZEM CD) 120 MG 24 hr capsule Take 1 capsule (120 mg total) by mouth daily.  30 capsule  1  . levETIRAcetam (KEPPRA) 100 MG/ML solution Take 17.5 mLs (1,750 mg total) by mouth 2 (two) times daily.  473 mL  1  . levETIRAcetam (KEPPRA) 250 MG tablet Take 1 tablet (250 mg total) by mouth 2 (two) times daily.  60 tablet  3  . levETIRAcetam (KEPPRA) 500 MG tablet Take 3 tablets (1,500 mg total) by mouth 2 (two) times daily.  180 tablet  3  . pantoprazole (PROTONIX) 40 MG tablet       . phenytoin (DILANTIN) 100 MG ER capsule Take 3 capsules (300 mg total) every morning.  90 capsule  3  . phenytoin (DILANTIN) 50 MG  tablet Chew 6 daily (total 300mg )  180 tablet  3  . warfarin (COUMADIN) 2.5 MG tablet Take as directed by Coumadin Clinic  45 tablet  3    No Known Allergies  History   Social History  . Marital Status: Divorced    Spouse Name: N/A    Number of Children: N/A  . Years of Education: N/A   Occupational History  . Not on file.   Social History Main Topics  . Smoking status: Former Smoker    Quit date: 07/15/1966  . Smokeless tobacco: Never Used  . Alcohol Use: No     Quit greater than 5 years ago  . Drug Use: No  . Sexually Active: Not on file   Other Topics Concern  . Not on file   Social History Narrative   Lives alone.  Retired from Research officer, political party    Family History  Problem Relation Age of Onset  . Hypertension      Physical Exam: Filed Vitals:   10/18/11 1045  BP: 140/78  Pulse: 87  Resp: 18  Height: 6' (1.829 m)  Weight: 160 lb 1.9 oz (72.63 kg)  SpO2: 98%    GEN- The patient is well appearing, alert and oriented x 3 today.   Head- normocephalic, atraumatic Eyes-  Sclera clear, conjunctiva pink Ears- hearing intact Oropharynx- clear Lungs-  Clear to ausculation bilaterally, normal work of breathing Heart- Regular rate and rhythm, no murmurs, rubs or gallops, PMI not laterally displaced GI- soft, NT, ND, + BS Extremities- no clubbing, cyanosis, trace ankle edema  ekg today reveals sinus rhythm 81 bpm, PR 214, incomplete RBBB  Assessment and Plan:

## 2011-10-18 NOTE — Patient Instructions (Addendum)
Your physician wants you to follow-up in: 12 months with Dr Allred You will receive a reminder letter in the mail two months in advance. If you don't receive a letter, please call our office to schedule the follow-up appointment.  

## 2011-10-26 ENCOUNTER — Ambulatory Visit (INDEPENDENT_AMBULATORY_CARE_PROVIDER_SITE_OTHER): Payer: Medicare Other | Admitting: Neurology

## 2011-10-26 ENCOUNTER — Encounter: Payer: Self-pay | Admitting: Neurology

## 2011-10-26 ENCOUNTER — Other Ambulatory Visit (INDEPENDENT_AMBULATORY_CARE_PROVIDER_SITE_OTHER): Payer: Medicare Other

## 2011-10-26 VITALS — BP 136/80 | HR 84 | Wt 161.0 lb

## 2011-10-26 DIAGNOSIS — R569 Unspecified convulsions: Secondary | ICD-10-CM

## 2011-10-26 LAB — COMPREHENSIVE METABOLIC PANEL
ALT: 14 U/L (ref 0–53)
Albumin: 4.4 g/dL (ref 3.5–5.2)
CO2: 34 mEq/L — ABNORMAL HIGH (ref 19–32)
Calcium: 8.8 mg/dL (ref 8.4–10.5)
Chloride: 100 mEq/L (ref 96–112)
GFR: 142.4 mL/min (ref >60.00)
Glucose, Bld: 81 mg/dL (ref 70–99)
Potassium: 4.3 mEq/L (ref 3.5–5.1)
Sodium: 140 mEq/L (ref 135–145)
Total Bilirubin: 0.7 mg/dL (ref 0.3–1.2)
Total Protein: 7.3 g/dL (ref 6.0–8.3)

## 2011-10-26 LAB — CBC WITH DIFFERENTIAL/PLATELET
Basophils Absolute: 0.1 10*3/uL (ref 0.0–0.1)
Eosinophils Relative: 3.6 % (ref 0.0–5.0)
Lymphocytes Relative: 29 % (ref 12.0–46.0)
Monocytes Relative: 12.4 % — ABNORMAL HIGH (ref 3.0–12.0)
Neutrophils Relative %: 53.5 % (ref 43.0–77.0)
Platelets: 249 10*3/uL (ref 150.0–400.0)
RDW: 14.5 % (ref 11.5–14.6)
WBC: 4.4 10*3/uL — ABNORMAL LOW (ref 4.5–10.5)

## 2011-10-26 MED ORDER — LEVETIRACETAM 500 MG PO TABS: 1500.0000 mg | ORAL_TABLET | Freq: Two times a day (BID) | ORAL | Status: DC

## 2011-10-26 MED ORDER — PHENYTOIN SODIUM EXTENDED 100 MG PO CAPS: ORAL_CAPSULE | ORAL | Status: DC

## 2011-10-26 MED ORDER — LEVETIRACETAM 250 MG PO TABS: 250.0000 mg | ORAL_TABLET | Freq: Two times a day (BID) | ORAL | Status: DC

## 2011-10-26 NOTE — Progress Notes (Signed)
Dear Dr. Pete Glatter,   Thank you for having me see [name] in consultation today at [facility] for his problem with ischemic stroke and seizures. As you may recall, he is a 71 y.o. year old male with a history of alcohol abuse, posterior left insular cortex stroke and resultant focal epilepsy who had another stroke 06/01/2011 who was also found to have atrial flutter. When he had the stroke, he also had frequent convulsions that same day. The ischemic stroke was left ACA territory and caused focal left leg weakness. He was in hospital for about 1 week, and then moved to rehab in the hospital. He was started on coumadin during that time.   With respect to his initial seizures these started after his stroke in 2006. He was maintained on 1500mg  of Keppra BID and 300mg  qhs of Dilantin. He had about 5 seizures between 2006 and 2009 but once they got his medications perfected he did not have any further seizures until the convulsions that occurred with his second stroke in 2013. He was initially maintained on Aggrenox with his first stroke, but then switched to aspirin 325mg . His diagnosis of atrial flutter was not made until the 2013 stroke when he was switched to Coumadin.   During his recent hospitalization his Keppra was increased to 1750 bid. Interestingly, his Dilantin level just before discharge was 21 and he was switched from 375 daily of dilantin to 150mg  daily. He has not had seizures since his release on 4/9.   His original seizures are described as speech arrest and behavioral arrest with a prolonged post ictal phase of difficulty speaking for at least a day. His daughter says it takes about 2 days for him to get back to normal.  He denies any problems with the AEDs. Currently, they are trying to optimize his Coumadin levels.   ------------------------------------------------------------  I checked his Dilantin level at his last visit.  On 150mg  daily it was 6.8.  I had him  increase it to 300mg  qhs which was original dose and rechecked the level which was 12.8.  He has continued on that dose.  He has not had any problems with his medications.  No seizures.  They have been struggling to keep his INR at the correct level.    Medical history, social history, and family history were reviewed and have not changed since the last clinic visit.  Current Outpatient Prescriptions on File Prior to Visit  Medication Sig Dispense Refill  . phenytoin (DILANTIN) 50 MG tablet Chew 6 daily (total 300mg )  180 tablet  3  . warfarin (COUMADIN) 2.5 MG tablet Take as directed by Coumadin Clinic  45 tablet  3  . diltiazem (CARDIZEM CD) 120 MG 24 hr capsule Take 1 capsule (120 mg total) by mouth daily.  30 capsule  11  . pantoprazole (PROTONIX) 40 MG tablet         No Known Allergies  ROS:  13 systems were reviewed and  are unremarkable.  Exam: . Filed Vitals:   10/26/11 1409  BP: 136/80  Pulse: 84  Weight: 161 lb (73.029 kg)    In general, well appearing man.   Cranial Nerves: Pupils are equally round and reactive to light. Visual fields full to confrontation. Extraocular movements are intact without nystagmus. Facial sensation and muscles of mastication are intact. Muscles of facial expression are symmetric. Hearing intact to bilateral finger rub. Tongue protrusion, uvula, palate midline.  Shoulder shrug intact  Motor:  Very mild  right sided weakness.  Reflexes:  Slightly brisker on the left.  Coordination:  Normal finger to nose  Gait:  Mild unsteadiness.  Romberg negative.  Impression/Recommendations:  1.  Focal seizures from insular infarct - continue Dilantin 300mg  hs and Keppra 1500mg  bid.  I am going to check a phenytoin level today and CBC CMP as well today.  ADDENDUM:  patient had a very elevated PHT level of 48.  Have asked them to hold for 2 days and then restart at 200mg  qhs and then recheck on Monday.  I suspect that the increase in phenytoin is due  to an interaction with the coumadin.  We will see the patient back in  months.  Lupita Raider Modesto Charon, MD [facility], [place]

## 2011-10-26 NOTE — Patient Instructions (Addendum)
Go to the basement to have your labs drawn today.  . 

## 2011-10-27 ENCOUNTER — Telehealth: Payer: Self-pay

## 2011-10-27 DIAGNOSIS — R569 Unspecified convulsions: Secondary | ICD-10-CM

## 2011-10-27 LAB — PHENYTOIN LEVEL, TOTAL: Phenytoin Lvl: 48.3 ug/mL — ABNORMAL HIGH (ref 10.0–20.0)

## 2011-10-27 NOTE — Telephone Encounter (Signed)
Spoke with Enrique Sack, pt's daughter, she confirmed he is only taking 300mg  every morning.  They will hold for two days and have it redrawn on Monday morning.

## 2011-10-29 NOTE — Telephone Encounter (Signed)
Could you call his daughter an have them restart the dilantin at 200mg  a day instead of 300?  Thx.

## 2011-10-29 NOTE — Telephone Encounter (Signed)
Pt's daughter aware to restart dilantin 200mg .

## 2011-10-30 LAB — PHENYTOIN LEVEL, FREE AND TOTAL: Phenytoin Bound: 28.6 mg/L

## 2011-11-08 ENCOUNTER — Other Ambulatory Visit: Payer: Medicare Other

## 2011-11-08 ENCOUNTER — Ambulatory Visit (INDEPENDENT_AMBULATORY_CARE_PROVIDER_SITE_OTHER): Payer: Medicare Other | Admitting: Pharmacist

## 2011-11-08 DIAGNOSIS — R569 Unspecified convulsions: Secondary | ICD-10-CM | POA: Diagnosis not present

## 2011-11-08 DIAGNOSIS — Z7901 Long term (current) use of anticoagulants: Secondary | ICD-10-CM

## 2011-11-08 DIAGNOSIS — I4892 Unspecified atrial flutter: Secondary | ICD-10-CM

## 2011-11-12 NOTE — Progress Notes (Signed)
Pt's daughter notified.

## 2011-11-29 ENCOUNTER — Ambulatory Visit (INDEPENDENT_AMBULATORY_CARE_PROVIDER_SITE_OTHER): Payer: Medicare Other | Admitting: *Deleted

## 2011-11-29 DIAGNOSIS — I4892 Unspecified atrial flutter: Secondary | ICD-10-CM | POA: Diagnosis not present

## 2011-11-29 DIAGNOSIS — Z7901 Long term (current) use of anticoagulants: Secondary | ICD-10-CM | POA: Diagnosis not present

## 2011-11-29 LAB — POCT INR: INR: 1.2

## 2011-12-03 DIAGNOSIS — R569 Unspecified convulsions: Secondary | ICD-10-CM | POA: Diagnosis not present

## 2011-12-03 DIAGNOSIS — Z79899 Other long term (current) drug therapy: Secondary | ICD-10-CM | POA: Diagnosis not present

## 2011-12-03 DIAGNOSIS — I10 Essential (primary) hypertension: Secondary | ICD-10-CM | POA: Diagnosis not present

## 2011-12-06 ENCOUNTER — Ambulatory Visit (INDEPENDENT_AMBULATORY_CARE_PROVIDER_SITE_OTHER): Payer: Medicare Other | Admitting: Neurology

## 2011-12-06 ENCOUNTER — Encounter: Payer: Self-pay | Admitting: Neurology

## 2011-12-06 VITALS — BP 122/78 | HR 88 | Resp 16 | Ht 72.0 in | Wt 161.5 lb

## 2011-12-06 DIAGNOSIS — R569 Unspecified convulsions: Secondary | ICD-10-CM

## 2011-12-06 NOTE — Patient Instructions (Addendum)
1.  Get lab work done today 2.  I would like you to hold on driving until next visit since you had those 2 spells.  Those may have been seizure. 3.  If able, could you please bring your daughter or sister to the next visit.

## 2011-12-06 NOTE — Progress Notes (Addendum)
[employer] [name] was seen today in neurologic consultation at the request of Ginette Otto, MD and Meryle Ready.  The patient is new to me since Dr. Modesto Charon has left the practice and I am now resuming his care.  I had the opportunity to review Dr. Nash Dimmer prior notes.  I also have reviewed and updated all of the patient's history and medications.  The patient had a history of seizure after a cerebral infarction in 2006.  He had approximately 5 seizures between 2006 and 2009.  Once his medications were properly adjusted, he had been seizure-free until an ACA stroke in 2013.  He is currently on Keppra, 1500 mg twice a day and Dilantin 200 mg daily.  His Dilantin level was checked at last visit and was markedly elevated at 48.  His Dilantin was held for 2 days and the dosage was decreased to 200 mg and another level was rechecked and it was normal.   He reports today, however, he is taking 3 tablets daily (300 mg).   His Dilantin levels have actually been up and down for quite some time.  I asked him several times how much Keppra he was taking and, while there was some confusion, it sounded like he was taking 2250 mg a day and he should be on 3000 per day.  His original seizures are described as speech arrest and behavioral arrest with a prolonged post ictal phase of difficulty speaking for at least a day. His daughter says it takes about 2 days for him to get back to normal.   Since last visit, the patient reports 2 events where he was "out of it for 10 minutes."  He cannot tell me what this means.  There was no LOC, no loss of bladder control.  No one was with him.  He can only say that he could not function. He did not try to walk, is not sure if he was confused and did not try to speak.   The last event was 2-3 weeks ago.  He reports good medication compliance.  The patient lives alone, drives and goes out to eat.  His sister lives a few miles from him.  His daughter lives in [place].  He administers  his own medication.  ALLERGIES:  No Known Allergies  CURRENT MEDICATIONS:  Current Outpatient Prescriptions on File Prior to Visit  Medication Sig Dispense Refill  . levETIRAcetam (KEPPRA) 250 MG tablet Take 1 tablet (250 mg total) by mouth 2 (two) times daily.  60 tablet  11  . levETIRAcetam (KEPPRA) 500 MG tablet Take 3 tablets (1,500 mg total) by mouth 2 (two) times daily.  180 tablet  11  . phenytoin (DILANTIN) 100 MG ER capsule Take 3 capsules (300 mg total) every morning.  90 capsule  11  . warfarin (COUMADIN) 2.5 MG tablet Take as directed by Coumadin Clinic  45 tablet  3  . diltiazem (CARDIZEM CD) 120 MG 24 hr capsule Take 1 capsule (120 mg total) by mouth daily.  30 capsule  11  . pantoprazole (PROTONIX) 40 MG tablet       . phenytoin (DILANTIN) 50 MG tablet Chew 6 daily (total 300mg )  180 tablet  3    PAST MEDICAL HISTORY:   Past Medical History  Diagnosis Date  . Seizures   . Stroke   . Pancreatitis   . Hiatal hernia   . HTN (hypertension)     pt denies h/o HTN though it appears he was started  on spironolactone during his hospitalization 3/13  . Typical atrial flutter 3/13    PAST SURGICAL HISTORY:   Past Surgical History  Procedure Date  . No surgical history 06/2011    SOCIAL HISTORY:   History   Social History  . Marital Status: Divorced    Spouse Name: N/A    Number of Children: N/A  . Years of Education: N/A   Occupational History  . Not on file.   Social History Main Topics  . Smoking status: Former Smoker    Quit date: 07/15/1966  . Smokeless tobacco: Never Used  . Alcohol Use: No     Quit greater than 5 years ago  . Drug Use: No  . Sexually Active: Not on file   Other Topics Concern  . Not on file   Social History Narrative   Lives alone.  Retired from Research officer, political party    FAMILY HISTORY:   Family Status  Relation Status Death Age  . Mother Deceased   . Father Deceased     ROS:  A complete 10 system review of systems was obtained and  was unremarkable apart from what is mentioned above.  PHYSICAL EXAMINATION:    VITALS:   Filed Vitals:   12/06/11 1331  BP: 122/78  Pulse: 88  Resp: 16  Height: 6' (1.829 m)  Weight: 161 lb 8 oz (73.256 kg)    GEN:  Normal appears male in no acute distress.  Appears stated age. HEENT:  Normocephalic, atraumatic. The mucous membranes are moist. The superficial temporal arteries are without ropiness or tenderness. Cardiovascular: Regular rate and rhythm. Lungs: Clear to auscultation bilaterally. Neck/Heme: There are no carotid bruits noted bilaterally.  NEUROLOGICAL: Orientation:  The patient is alert and oriented x 3 (knows month, date, year).  Knows season.  He identifies the president correctly but cannot tell me the [employer].  Is a poor medical historian and has trouble with the medical history. Attention span and concentration normal.  Repeats and names without difficulty. Cranial nerves: There is good facial symmetry. The pupils are equal round and reactive to light bilaterally. Fundoscopic exam reveals clear disc margins bilaterally. Extraocular muscles are intact and visual fields are full to confrontational testing. Speech is fluent but very mildly dysarthric. Soft palate rises symmetrically and there is no tongue deviation. Hearing is intact to conversational tone. Tone: Tone is good throughout. Sensation: Sensation is intact to light touch and pinprick throughout (facial, trunk, extremities). Vibration is intact at the bilateral big toe. There is definite extinction with double simultaneous stimulation on the R. There is no sensory dermatomal level identified. Coordination:  The patient has no difficulty with RAM's or FNF bilaterally. Motor: Strength is 5/5 in the bilateral upper and lower extremities.  Shoulder shrug is equal and symmetric. There is no pronator drift.  There are no fasciculations noted. DTR's: Deep tendon reflexes are 2-2+/4 at the bilateral biceps,  triceps, brachioradialis, patella and achilles.  Plantar responses are downgoing bilaterally. Gait and Station: The patient is able to ambulate without difficulty.  He does flex the right quadriceps with ambulation to pick up the knee and foot.  He has trouble with heel toe walk. The patient is unable to ambulate in a tandem fashion. The patient is able to stand in the Romberg position.  Lab Results  Component Value Date   PHENYTOIN 16.6 11/08/2011     IMPRESSION:  1. seizure.  This is secondary to previous cerebral infarctions.  I am concerned about his  most recent spells in that they may represent seizure.  I am also concerned with his ability to properly administer his own medications.  His Dilantin levels have been up and down, and I have also noted that they have had difficulty regulating his INR. 2.  Memory loss.  The patient is new to me and no one accompanies him today to the visit.  This is something that we will be following closely.  PLAN:  1. we will repeat a Dilantin level today. 2.  I asked the patient to hold on driving at least until next visit. 3.  I asked the patient to either have his daughter or sister accompany him to next visit. 4.  We will perform an MMSE next visit. 5.  Patient education was provided.  He was given handouts on local resources for seniors. 6.  Seizure and safety was discussed. 7.  He was asked to call next time he believes that he has any event/seizure. 8.  Follow up is anticipated in the next few months, sooner should new neurologic issues arise. 9.  Time in visit was 60 minutes.   ADDENDUM:  Just received dilantin level and it was 12.6

## 2011-12-08 ENCOUNTER — Telehealth: Payer: Self-pay | Admitting: Neurology

## 2011-12-08 NOTE — Telephone Encounter (Signed)
Spoke with the patient's daughter. Information given re: Dilantin level within normal limits. She states he is taking Dilantin 100 mg tabs 2 every day. Keppra 500 mg tabs 3 BID and 250 mg tabs 1 BID (1750 mg BID). No additional concerns/issues voiced at this time. **Dr. Carmel [place]....Marland KitchenMarland Kitchen

## 2011-12-08 NOTE — Telephone Encounter (Signed)
Message copied by Benay Spice on Wed Dec 08, 2011  1:57 PM ------      Message from: TAT, REBECCA S      Created: Wed Dec 08, 2011 10:44 AM       Please let pt know that dilantin level is normal.  Please ask him again how he is taking both dilantin and Keppra (levetiracetam)..i.e. Total daily dosages of both.            Thanks!

## 2011-12-09 NOTE — Telephone Encounter (Signed)
So that is a total of 2000 mg per day, correct?  In [name]'s old note, it said that the patient was supposed to be on 1500 mg bid.  If he is really on 2000mg  per day, have him increase to 2500 mg per day (he can take the 500mg  tablets, 3 in the AM and 2 at night) and not take the 250 mg tabs at all

## 2011-12-09 NOTE — Telephone Encounter (Signed)
Spoke with Enrique Sack, patient's daughter. Informed to continue the Dilantin and Keppra as she states to me yesterday. She is also aware of our new location. Advised to call if problems or seizure activity. She states she will. **Dr. Arbutus Leas, the AVS of the 08/06 office visit addressed the increased Keppra dose (1750 mg BID) which was different from that stated in the MD dictation on the same day.

## 2011-12-09 NOTE — Telephone Encounter (Signed)
Great.  Thanks so much, [place].  Would have never thought to look in the AVS

## 2011-12-13 ENCOUNTER — Ambulatory Visit (INDEPENDENT_AMBULATORY_CARE_PROVIDER_SITE_OTHER): Payer: Medicare Other | Admitting: Pharmacist

## 2011-12-13 ENCOUNTER — Encounter: Payer: Self-pay | Admitting: Neurology

## 2011-12-13 DIAGNOSIS — Z7901 Long term (current) use of anticoagulants: Secondary | ICD-10-CM | POA: Diagnosis not present

## 2011-12-13 DIAGNOSIS — I4892 Unspecified atrial flutter: Secondary | ICD-10-CM | POA: Diagnosis not present

## 2011-12-14 NOTE — Progress Notes (Signed)
Need to pick INR value?

## 2011-12-16 DIAGNOSIS — R569 Unspecified convulsions: Secondary | ICD-10-CM | POA: Diagnosis not present

## 2011-12-23 ENCOUNTER — Ambulatory Visit (INDEPENDENT_AMBULATORY_CARE_PROVIDER_SITE_OTHER): Payer: Medicare Other | Admitting: *Deleted

## 2011-12-23 DIAGNOSIS — Z7901 Long term (current) use of anticoagulants: Secondary | ICD-10-CM | POA: Diagnosis not present

## 2011-12-23 DIAGNOSIS — I4892 Unspecified atrial flutter: Secondary | ICD-10-CM

## 2011-12-23 LAB — POCT INR: INR: 1.5

## 2011-12-30 ENCOUNTER — Ambulatory Visit (INDEPENDENT_AMBULATORY_CARE_PROVIDER_SITE_OTHER): Payer: Medicare Other | Admitting: *Deleted

## 2011-12-30 DIAGNOSIS — I4892 Unspecified atrial flutter: Secondary | ICD-10-CM | POA: Diagnosis not present

## 2011-12-30 DIAGNOSIS — Z7901 Long term (current) use of anticoagulants: Secondary | ICD-10-CM

## 2011-12-30 LAB — POCT INR: INR: 2

## 2012-01-10 ENCOUNTER — Ambulatory Visit (INDEPENDENT_AMBULATORY_CARE_PROVIDER_SITE_OTHER): Payer: Medicare Other | Admitting: *Deleted

## 2012-01-10 DIAGNOSIS — Z7901 Long term (current) use of anticoagulants: Secondary | ICD-10-CM

## 2012-01-10 DIAGNOSIS — I4892 Unspecified atrial flutter: Secondary | ICD-10-CM | POA: Diagnosis not present

## 2012-02-01 ENCOUNTER — Other Ambulatory Visit: Payer: Self-pay | Admitting: Pharmacist

## 2012-02-01 MED ORDER — WARFARIN SODIUM 2.5 MG PO TABS: ORAL_TABLET | ORAL | Status: DC

## 2012-02-07 ENCOUNTER — Ambulatory Visit (INDEPENDENT_AMBULATORY_CARE_PROVIDER_SITE_OTHER): Payer: Medicare Other | Admitting: Pharmacist

## 2012-02-07 DIAGNOSIS — Z7901 Long term (current) use of anticoagulants: Secondary | ICD-10-CM | POA: Diagnosis not present

## 2012-02-07 DIAGNOSIS — I4892 Unspecified atrial flutter: Secondary | ICD-10-CM

## 2012-03-01 ENCOUNTER — Ambulatory Visit (INDEPENDENT_AMBULATORY_CARE_PROVIDER_SITE_OTHER): Payer: Medicare Other | Admitting: *Deleted

## 2012-03-01 DIAGNOSIS — Z7901 Long term (current) use of anticoagulants: Secondary | ICD-10-CM | POA: Diagnosis not present

## 2012-03-01 DIAGNOSIS — I4892 Unspecified atrial flutter: Secondary | ICD-10-CM | POA: Diagnosis not present

## 2012-03-01 LAB — POCT INR: INR: 1.8

## 2012-03-24 ENCOUNTER — Ambulatory Visit (INDEPENDENT_AMBULATORY_CARE_PROVIDER_SITE_OTHER): Payer: Medicare Other

## 2012-03-24 DIAGNOSIS — Z7901 Long term (current) use of anticoagulants: Secondary | ICD-10-CM | POA: Diagnosis not present

## 2012-03-24 DIAGNOSIS — I4892 Unspecified atrial flutter: Secondary | ICD-10-CM

## 2012-03-24 LAB — POCT INR: INR: 2.2

## 2012-03-30 DIAGNOSIS — Z0000 Encounter for general adult medical examination without abnormal findings: Secondary | ICD-10-CM | POA: Diagnosis not present

## 2012-03-30 DIAGNOSIS — I10 Essential (primary) hypertension: Secondary | ICD-10-CM | POA: Diagnosis not present

## 2012-03-30 DIAGNOSIS — Z1331 Encounter for screening for depression: Secondary | ICD-10-CM | POA: Diagnosis not present

## 2012-03-30 DIAGNOSIS — I6990 Unspecified sequelae of unspecified cerebrovascular disease: Secondary | ICD-10-CM | POA: Diagnosis not present

## 2012-04-21 ENCOUNTER — Ambulatory Visit (INDEPENDENT_AMBULATORY_CARE_PROVIDER_SITE_OTHER): Payer: Medicare Other | Admitting: *Deleted

## 2012-04-21 DIAGNOSIS — I4892 Unspecified atrial flutter: Secondary | ICD-10-CM | POA: Diagnosis not present

## 2012-04-21 DIAGNOSIS — Z7901 Long term (current) use of anticoagulants: Secondary | ICD-10-CM

## 2012-04-21 LAB — POCT INR: INR: 2.7

## 2012-05-31 ENCOUNTER — Telehealth: Payer: Self-pay | Admitting: *Deleted

## 2012-05-31 NOTE — Telephone Encounter (Signed)
Message copied by Carmela Hurt on Wed May 31, 2012 12:25 PM ------      Message from: Deliah Boston      Created: Mon May 29, 2012  5:13 PM      Regarding: FW: medication         Patient is due to see yall now.  Will you please pass this along.  I never saw in note that it was discontinued however if he has not been taking he can stay off            Tresa Endo      ----- Message -----         From: Hillis Range, MD         Sent: 05/13/2012   9:35 PM           To: Deliah Boston, RN      Subject: RE: medication                                           If he has not been taking and is having no troubles, then I would not restart at this time.                  ----- Message -----         From: Deliah Boston, RN         Sent: 05/09/2012  11:01 AM           To: Hillis Range, MD      Subject: FW: medication                                           I think this is a misunderstanding.  I do not see this in your note.  Do you want him to continue if he has not been taking?      Tresa Endo      ----- Message -----         From: Carmela Hurt, RN         Sent: 04/21/2012   2:35 PM           To: Deliah Boston, RN      Subject: medication                                               Patient states he is no longer taking cardizem, states at last appt with Dr Johney Frame he told him he could stay off of it for 12 months, under Dr Amedeo Plenty note there is a refill for cardizem but also a d/c of cardizem, same dose. Please confirm and call patient. Thanks, Selena Batten                   ------

## 2012-05-31 NOTE — Telephone Encounter (Signed)
Spoke with pt on phone, he states he is not taking the cardizem and has been feeling very well, I gave him Dr Amedeo Plenty response regarding this medication. Also made appt for pt at  Coumadin clinic  since he missed his appt due to weather.

## 2012-06-02 ENCOUNTER — Ambulatory Visit (INDEPENDENT_AMBULATORY_CARE_PROVIDER_SITE_OTHER): Payer: Medicare Other | Admitting: *Deleted

## 2012-06-02 DIAGNOSIS — I4892 Unspecified atrial flutter: Secondary | ICD-10-CM

## 2012-06-02 DIAGNOSIS — Z7901 Long term (current) use of anticoagulants: Secondary | ICD-10-CM

## 2012-06-02 MED ORDER — WARFARIN SODIUM 2.5 MG PO TABS: ORAL_TABLET | ORAL | Status: DC

## 2012-06-29 DIAGNOSIS — R109 Unspecified abdominal pain: Secondary | ICD-10-CM | POA: Diagnosis not present

## 2012-06-29 DIAGNOSIS — I4891 Unspecified atrial fibrillation: Secondary | ICD-10-CM | POA: Diagnosis not present

## 2012-07-06 DIAGNOSIS — R071 Chest pain on breathing: Secondary | ICD-10-CM | POA: Diagnosis not present

## 2012-07-14 ENCOUNTER — Ambulatory Visit (INDEPENDENT_AMBULATORY_CARE_PROVIDER_SITE_OTHER): Payer: Medicare Other | Admitting: *Deleted

## 2012-07-14 DIAGNOSIS — Z7901 Long term (current) use of anticoagulants: Secondary | ICD-10-CM

## 2012-07-14 DIAGNOSIS — I4892 Unspecified atrial flutter: Secondary | ICD-10-CM

## 2012-08-11 ENCOUNTER — Ambulatory Visit (INDEPENDENT_AMBULATORY_CARE_PROVIDER_SITE_OTHER): Payer: Medicare Other | Admitting: *Deleted

## 2012-08-11 DIAGNOSIS — Z7901 Long term (current) use of anticoagulants: Secondary | ICD-10-CM | POA: Diagnosis not present

## 2012-08-11 DIAGNOSIS — I4892 Unspecified atrial flutter: Secondary | ICD-10-CM | POA: Diagnosis not present

## 2012-09-08 ENCOUNTER — Ambulatory Visit (INDEPENDENT_AMBULATORY_CARE_PROVIDER_SITE_OTHER): Payer: Medicare Other | Admitting: *Deleted

## 2012-09-08 DIAGNOSIS — I4892 Unspecified atrial flutter: Secondary | ICD-10-CM

## 2012-09-08 DIAGNOSIS — Z7901 Long term (current) use of anticoagulants: Secondary | ICD-10-CM

## 2012-09-08 LAB — POCT INR: INR: 2.3

## 2012-09-19 ENCOUNTER — Emergency Department (HOSPITAL_COMMUNITY)
Admission: EM | Admit: 2012-09-19 | Discharge: 2012-09-19 | Disposition: A | Discharge status: Home / Self Care | Payer: Medicare Other | Attending: Emergency Medicine | Admitting: Emergency Medicine

## 2012-09-19 ENCOUNTER — Encounter (HOSPITAL_COMMUNITY): Payer: Self-pay | Admitting: Emergency Medicine

## 2012-09-19 ENCOUNTER — Emergency Department (HOSPITAL_COMMUNITY): Payer: Medicare Other

## 2012-09-19 DIAGNOSIS — Z7901 Long term (current) use of anticoagulants: Secondary | ICD-10-CM | POA: Diagnosis not present

## 2012-09-19 DIAGNOSIS — Z79899 Other long term (current) drug therapy: Secondary | ICD-10-CM | POA: Diagnosis not present

## 2012-09-19 DIAGNOSIS — G40909 Epilepsy, unspecified, not intractable, without status epilepticus: Secondary | ICD-10-CM | POA: Diagnosis not present

## 2012-09-19 DIAGNOSIS — Z8719 Personal history of other diseases of the digestive system: Secondary | ICD-10-CM | POA: Diagnosis not present

## 2012-09-19 DIAGNOSIS — I10 Essential (primary) hypertension: Secondary | ICD-10-CM | POA: Insufficient documentation

## 2012-09-19 DIAGNOSIS — R569 Unspecified convulsions: Secondary | ICD-10-CM | POA: Diagnosis not present

## 2012-09-19 DIAGNOSIS — Z87891 Personal history of nicotine dependence: Secondary | ICD-10-CM | POA: Diagnosis not present

## 2012-09-19 DIAGNOSIS — Z8679 Personal history of other diseases of the circulatory system: Secondary | ICD-10-CM | POA: Diagnosis not present

## 2012-09-19 DIAGNOSIS — R4182 Altered mental status, unspecified: Secondary | ICD-10-CM | POA: Diagnosis not present

## 2012-09-19 DIAGNOSIS — Z8673 Personal history of transient ischemic attack (TIA), and cerebral infarction without residual deficits: Secondary | ICD-10-CM | POA: Insufficient documentation

## 2012-09-19 LAB — CBC WITH DIFFERENTIAL/PLATELET
Basophils Absolute: 0.1 10*3/uL (ref 0.0–0.1)
HCT: 34.1 % — ABNORMAL LOW (ref 39.0–52.0)
Hemoglobin: 11.5 g/dL — ABNORMAL LOW (ref 13.0–17.0)
Lymphocytes Relative: 19 % (ref 12–46)
Monocytes Absolute: 0.7 10*3/uL (ref 0.1–1.0)
Neutro Abs: 2.6 10*3/uL (ref 1.7–7.7)
RBC: 3.89 MIL/uL — ABNORMAL LOW (ref 4.22–5.81)
RDW: 16.3 % — ABNORMAL HIGH (ref 11.5–15.5)
WBC: 4.4 10*3/uL (ref 4.0–10.5)

## 2012-09-19 LAB — COMPREHENSIVE METABOLIC PANEL
Alkaline Phosphatase: 138 U/L — ABNORMAL HIGH (ref 39–117)
BUN: 11 mg/dL (ref 6–23)
Calcium: 9 mg/dL (ref 8.4–10.5)
Creatinine, Ser: 0.68 mg/dL (ref 0.50–1.35)
GFR calc Af Amer: >90 mL/min (ref >90)
Glucose, Bld: 97 mg/dL (ref 70–99)
Total Protein: 7.5 g/dL (ref 6.0–8.3)

## 2012-09-19 LAB — PHENYTOIN LEVEL, TOTAL: Phenytoin Lvl: 20 ug/mL (ref 10.0–20.0)

## 2012-09-19 MED ORDER — SODIUM CHLORIDE 0.9 % IV SOLN: INTRAVENOUS | Status: DC

## 2012-09-19 NOTE — ED Provider Notes (Signed)
History    CSN: [id number] Arrival date & time 09/19/12  2057  First MD Initiated Contact with Patient 09/19/12 2110     Chief Complaint  Patient presents with  . Altered Mental Status   (Consider location/radiation/quality/duration/timing/severity/associated sxs/prior Treatment) Patient is a 72 y.o. male presenting with altered mental status. The history is provided by the patient.  Altered Mental Status  patient here after having possible seizure prior to arrival. States he was driving his car when he felt was going to have a seizure. According to EMS he was postictal period he denies any loss of bladder function. No trauma to his tongue. States compliance with his Keppra and Dilantin. Denies any headache or neck pain. No vomiting noted. No weakness in his upper lower extremities. States he feels back to his baseline. He is concerned that he did not have any food all day and thinks that this might be the cause of his current symptoms. Past Medical History  Diagnosis Date  . Seizures   . Stroke   . Pancreatitis   . Hiatal hernia   . HTN (hypertension)     pt denies h/o HTN though it appears he was started on spironolactone during his hospitalization 3/13  . Typical atrial flutter 3/13   Past Surgical History  Procedure Laterality Date  . No surgical history  06/2011   Family History  Problem Relation Age of Onset  . Hypertension     History  Substance Use Topics  . Smoking status: Former Smoker    Quit date: 07/15/1966  . Smokeless tobacco: Never Used  . Alcohol Use: No     Comment: Quit greater than 5 years ago    Review of Systems  Psychiatric/Behavioral: Positive for altered mental status.  All other systems reviewed and are negative.    Allergies  Review of patient's allergies indicates no known allergies.  Home Medications   Current Outpatient Rx  Name  Route  Sig  Dispense  Refill  . diltiazem (CARDIZEM CD) 120 MG 24 hr capsule   Oral   Take 1 capsule  (120 mg total) by mouth daily.   30 capsule   11   . levETIRAcetam (KEPPRA) 250 MG tablet   Oral   Take 1 tablet (250 mg total) by mouth 2 (two) times daily.   60 tablet   11   . levETIRAcetam (KEPPRA) 500 MG tablet   Oral   Take 3 tablets (1,500 mg total) by mouth 2 (two) times daily.   180 tablet   11   . phenytoin (DILANTIN) 100 MG ER capsule      Take 3 capsules (300 mg total) every morning.   90 capsule   11   . phenytoin (DILANTIN) 100 MG ER capsule   Oral   Take by mouth 3 (three) times daily. Pt takes 2 capsules in the morning, 1 capsule early evening and 1 capsule at night         . warfarin (COUMADIN) 2.5 MG tablet      Take as directed by [facility]   60 tablet   3     30-day supply    BP 130/81  Pulse 100  Temp(Src) 98.6 F (37 C) (Oral)  Resp 14  SpO2 98% Physical Exam  Nursing note and vitals reviewed. Constitutional: He is oriented to person, place, and time. He appears well-developed and well-nourished.  Non-toxic appearance. No distress.  HENT:  Head: Normocephalic and atraumatic.  Eyes: Conjunctivae, EOM  and lids are normal. Pupils are equal, round, and reactive to light.  Neck: Normal range of motion. Neck supple. No tracheal deviation present. No mass present.  Cardiovascular: Normal rate, regular rhythm and normal heart sounds.  Exam reveals no gallop.   No murmur heard. Pulmonary/Chest: Effort normal and breath sounds normal. No stridor. No respiratory distress. He has no decreased breath sounds. He has no wheezes. He has no rhonchi. He has no rales.  Abdominal: Soft. Normal appearance and bowel sounds are normal. He exhibits no distension. There is no tenderness. There is no rebound and no CVA tenderness.  Musculoskeletal: Normal range of motion. He exhibits no edema and no tenderness.  Neurological: He is alert and oriented to person, place, and time. He has normal strength. No cranial nerve deficit or sensory deficit. GCS eye  subscore is 4. GCS verbal subscore is 5. GCS motor subscore is 6.  Skin: Skin is warm and dry. No abrasion and no rash noted.  Psychiatric: He has a normal mood and affect. His speech is normal and behavior is normal.    ED Course  Procedures (including critical care time) Labs Reviewed  PHENYTOIN LEVEL, TOTAL  PROTIME-INR  COMPREHENSIVE METABOLIC PANEL  CBC WITH DIFFERENTIAL   No results found. No diagnosis found.  MDM  Pt at his baseline now, likely had a seizure, workup neg, stable for d/c  Toy Baker, MD 09/19/12 2314

## 2012-09-19 NOTE — ED Notes (Signed)
PER EMS- pt picked up with c/o altered mental status and possible seizures.  Reports pt was driving, he then pulled over and [employer] was present on arrival.  EMS reports sister stated when pt feels like he's about to have a seizure he pulls over.  Pt is alert, but disoriented.

## 2012-09-19 NOTE — ED Notes (Signed)
ZDG:[id number]<HK> Expected date:09/19/12<BR> Expected time: 8:47 PM<BR> Means of arrival:Ambulance<BR> Comments:<BR> Seizures/confused, elderly

## 2012-09-19 NOTE — ED Notes (Signed)
Patient transported to CT 

## 2012-10-05 DIAGNOSIS — I4891 Unspecified atrial fibrillation: Secondary | ICD-10-CM | POA: Diagnosis not present

## 2012-10-05 DIAGNOSIS — I10 Essential (primary) hypertension: Secondary | ICD-10-CM | POA: Diagnosis not present

## 2012-10-05 DIAGNOSIS — R569 Unspecified convulsions: Secondary | ICD-10-CM | POA: Diagnosis not present

## 2012-10-13 ENCOUNTER — Other Ambulatory Visit: Payer: Self-pay | Admitting: Internal Medicine

## 2012-10-13 NOTE — Addendum Note (Signed)
Addended by: Carmela Hurt on: 10/13/2012 03:24 PM   Modules accepted: Orders, Medications

## 2012-10-19 ENCOUNTER — Ambulatory Visit (INDEPENDENT_AMBULATORY_CARE_PROVIDER_SITE_OTHER): Payer: Medicare Other | Admitting: *Deleted

## 2012-10-19 ENCOUNTER — Ambulatory Visit (INDEPENDENT_AMBULATORY_CARE_PROVIDER_SITE_OTHER): Payer: Medicare Other | Admitting: Internal Medicine

## 2012-10-19 ENCOUNTER — Encounter: Payer: Self-pay | Admitting: Internal Medicine

## 2012-10-19 VITALS — BP 120/70 | HR 87 | Ht 72.0 in | Wt 154.0 lb

## 2012-10-19 DIAGNOSIS — I4892 Unspecified atrial flutter: Secondary | ICD-10-CM | POA: Diagnosis not present

## 2012-10-19 DIAGNOSIS — Z7901 Long term (current) use of anticoagulants: Secondary | ICD-10-CM | POA: Diagnosis not present

## 2012-10-19 NOTE — Patient Instructions (Addendum)
Your physician wants you to follow-up in: 12 months with Dr Allred You will receive a reminder letter in the mail two months in advance. If you don't receive a letter, please call our office to schedule the follow-up appointment.  

## 2012-10-19 NOTE — Progress Notes (Signed)
PCP:  Ginette Otto, MD  The patient presents today for routine cardiology followup.  He is doing well.  He is unaware of any recent atrial arrhythmias.  He continues to take coumadin.  Today, he denies symptoms of  chest pain, shortness of breath,  lower extremity edema (above baseline) dizziness, presyncope, syncope, or further neurologic sequela.    The patient feels that he is tolerating medications without difficulties and is otherwise without complaint today.   Past Medical History  Diagnosis Date  . Seizures   . Stroke   . Pancreatitis   . Hiatal hernia   . HTN (hypertension)     pt denies h/o HTN though it appears he was started on spironolactone during his hospitalization 3/13  . Typical atrial flutter 3/13   Past Surgical History  Procedure Laterality Date  . No surgical history  06/2011    Current Outpatient Prescriptions  Medication Sig Dispense Refill  . levETIRAcetam (KEPPRA) 250 MG tablet Take 1 tablet (250 mg total) by mouth 2 (two) times daily.  60 tablet  11  . levETIRAcetam (KEPPRA) 500 MG tablet Take 3 tablets (1,500 mg total) by mouth 2 (two) times daily.  180 tablet  11  . phenytoin (DILANTIN) 100 MG ER capsule Take 3 capsules (300 mg total) every morning.  90 capsule  11  . warfarin (COUMADIN) 2.5 MG tablet Take 1 tablet (2.5 mg total) by mouth as directed.  60 tablet  3   No current facility-administered medications for this visit.    No Known Allergies  History   Social History  . Marital Status: Divorced    Spouse Name: N/A    Number of Children: N/A  . Years of Education: N/A   Occupational History  . Not on file.   Social History Main Topics  . Smoking status: Former Smoker    Quit date: 07/15/1966  . Smokeless tobacco: Never Used  . Alcohol Use: No     Comment: Quit greater than 5 years ago  . Drug Use: No  . Sexually Active: Not on file   Other Topics Concern  . Not on file   Social History Narrative   Lives alone.  Retired  from Research officer, political party    Family History  Problem Relation Age of Onset  . Hypertension      Physical Exam: Filed Vitals:   10/19/12 1446  BP: 120/70  Pulse: 87  Height: 6' (1.829 m)  Weight: 154 lb (69.854 kg)  SpO2: 98%    GEN- The patient is well appearing, alert and oriented x 3 today.   Head- normocephalic, atraumatic Eyes-  Sclera clear, conjunctiva pink Ears- hearing intact Oropharynx- clear Lungs- Clear to ausculation bilaterally, normal work of breathing Heart- Regular rate and rhythm, no murmurs, rubs or gallops, PMI not laterally displaced GI- soft, NT, ND, + BS Extremities- no clubbing, cyanosis, trace ankle edema  ekg today reveals sinus rhythm 87 bpm, PR 206, incomplete RBBB  Assessment and Plan:  1. Atrial flutter Well controlled Continue coumadin  Return in 1 year

## 2012-10-31 ENCOUNTER — Telehealth: Payer: Self-pay

## 2012-10-31 NOTE — Telephone Encounter (Signed)
Refill request for Keppra to [facility] [address]

## 2012-11-01 NOTE — Telephone Encounter (Signed)
Pt has not been seen in almost a year and needs a f/u.  One month supply of med only and needs f/u before refilled.

## 2012-11-02 MED ORDER — LEVETIRACETAM 250 MG PO TABS: 250.0000 mg | ORAL_TABLET | Freq: Two times a day (BID) | ORAL | Status: DC

## 2012-11-02 NOTE — Telephone Encounter (Signed)
Rx sent in with note that pt needs ov prior to more refills.

## 2012-11-09 ENCOUNTER — Ambulatory Visit: Payer: Medicare Other | Admitting: Neurology

## 2012-11-10 ENCOUNTER — Telehealth: Payer: Self-pay

## 2012-11-10 MED ORDER — PHENYTOIN SODIUM EXTENDED 100 MG PO CAPS: ORAL_CAPSULE | ORAL | Status: DC

## 2012-11-10 MED ORDER — LEVETIRACETAM 500 MG PO TABS: ORAL_TABLET | ORAL | Status: DC

## 2012-11-10 NOTE — Telephone Encounter (Signed)
Phone call from [facility], pt is out of dilantin and keppra and they would like clarity and refills.  Left msg for pt to clarify doses and called daughter who was going to try and reach him as well.  Per [name], his daughter he has been in the hospital sometime between his last ov and now and his meds where all changed.  She was going to try and call me back before 5, but apparently was not able to do so.  Per Dr Tat call in Keppra 1500mg  bid and dilantin 300mg  qd x 1 week.  Pt has to have ov next week to for further refills.

## 2012-11-13 ENCOUNTER — Telehealth: Payer: Self-pay

## 2012-11-13 MED ORDER — LEVETIRACETAM 250 MG PO TABS: 250.0000 mg | ORAL_TABLET | Freq: Two times a day (BID) | ORAL | Status: DC

## 2012-11-13 NOTE — Telephone Encounter (Signed)
Pt's daughter called back he is taking Keppra 500mg  3 po bid and keppra 250 one po bid and dilantin 100mg  3 qam.  He is scheduled for Wednesday at 2:30pm.  I will send in the Keppra 250 dose as well.

## 2012-11-15 ENCOUNTER — Ambulatory Visit: Payer: Medicare Other | Admitting: Neurology

## 2012-11-16 ENCOUNTER — Encounter: Payer: Self-pay | Admitting: Neurology

## 2012-11-16 ENCOUNTER — Ambulatory Visit (INDEPENDENT_AMBULATORY_CARE_PROVIDER_SITE_OTHER): Payer: Medicare Other | Admitting: Neurology

## 2012-11-16 VITALS — BP 126/68 | HR 80 | Temp 98.2°F | Resp 16 | Ht 72.0 in | Wt 154.0 lb

## 2012-11-16 DIAGNOSIS — F0280 Dementia in other diseases classified elsewhere without behavioral disturbance: Secondary | ICD-10-CM | POA: Diagnosis not present

## 2012-11-16 DIAGNOSIS — R569 Unspecified convulsions: Secondary | ICD-10-CM

## 2012-11-16 MED ORDER — LEVETIRACETAM 500 MG PO TABS: ORAL_TABLET | ORAL | Status: DC

## 2012-11-16 MED ORDER — PHENYTOIN SODIUM EXTENDED 100 MG PO CAPS: ORAL_CAPSULE | ORAL | Status: DC

## 2012-11-16 NOTE — Patient Instructions (Addendum)
1.  I recommended that you should not be driving by [employer] 2.  We will get labs today 3.  I recommend that you take your meds for seizure as follows:  Keppra: 500 mg - 3 tablets twice per day, dilantin - 100 mg - 3 tablets in the AM.  Stop the keppra 250 mg that you are taking twice per day 4.  I will see you at the beginning of november

## 2012-11-16 NOTE — Progress Notes (Signed)
[name] was seen today in neurologic consultation at the request of Ginette Otto, MD and Meryle Ready.  The patient is new to me since Dr. Modesto Charon has left the practice and I am now resuming his care.  I had the opportunity to review Dr. Nash Dimmer prior notes.  I also have reviewed and updated all of the patient's history and medications.  The patient had a history of seizure after a cerebral infarction in 2006.  He had approximately 5 seizures between 2006 and 2009.  Once his medications were properly adjusted, he had been seizure-free until an ACA stroke in 2013.  He is currently on Keppra, 1500 mg twice a day and Dilantin 200 mg daily.  His Dilantin level was checked at last visit and was markedly elevated at 48.  His Dilantin was held for 2 days and the dosage was decreased to 200 mg and another level was rechecked and it was normal.   He reports today, however, he is taking 3 tablets daily (300 mg).   His Dilantin levels have actually been up and down for quite some time.  I asked him several times how much Keppra he was taking and, while there was some confusion, it sounded like he was taking 2250 mg a day and he should be on 3000 per day.  His original seizures are described as speech arrest and behavioral arrest with a prolonged post ictal phase of difficulty speaking for at least a day. His daughter says it takes about 2 days for him to get back to normal.   Since last visit, the patient reports 2 events where he was "out of it for 10 minutes."  He cannot tell me what this means.  There was no LOC, no loss of bladder control.  No one was with him.  He can only say that he could not function. He did not try to walk, is not sure if he was confused and did not try to speak.   The last event was 2-3 weeks ago.  He reports good medication compliance.  The patient lives alone, drives and goes out to eat.  His sister lives a few miles from him.  His daughter lives in [place].  He administers  his own medication.  11/16/12:  The patient is following up today.  He has not been seen in our clinic since September, 2013 (one year ago).  He was scheduled for an appointment yesterday, but called two and a half hours after the appointment time and reported that he got lost and did not know how to get to the office.  He does state that he was in the building but couldn't find our suite number.   I did review records made available to me since his last visit.  I see that the patient was seen in the ER after a possible seizure on July 1.  ER records indicate that he was driving and felt like he might have a seizure.  Pt doesn't remember sx's at all today.  About once per month, he does feel "out of it" for about 10 min.   His Dilantin level was 20.0.  Unfortunately, no one accompanies him today.  We had talked previously to his daughter who is in [place] and I was hoping that she could accompany the patient.  There remains some confusion on his overall Keppra dosage.  According to his daughter on the phone previously, the patient is taking 1750 mg of Keppra twice daily.  This is above the recommended dosage of 3000 mg daily.  The pt was able to corrobate this.  He doesn't know names of drugs but does know how many pills he takes per day.    ALLERGIES:  No Known Allergies  CURRENT MEDICATIONS:  Current Outpatient Prescriptions on File Prior to Visit  Medication Sig Dispense Refill  . levETIRAcetam (KEPPRA) 250 MG tablet Take 1 tablet (250 mg total) by mouth 2 (two) times daily.  10 tablet  0  . levETIRAcetam (KEPPRA) 500 MG tablet 3 tabs bid  42 tablet  0  . phenytoin (DILANTIN) 100 MG ER capsule Take 3 capsules (300 mg total) every morning.  21 capsule  0  . warfarin (COUMADIN) 2.5 MG tablet Take 1 tablet (2.5 mg total) by mouth as directed.  60 tablet  3   No current facility-administered medications on file prior to visit.    PAST MEDICAL HISTORY:   Past Medical History  Diagnosis Date  .  Seizures   . Stroke   . Pancreatitis   . Hiatal hernia   . HTN (hypertension)     pt denies h/o HTN though it appears he was started on spironolactone during his hospitalization 3/13  . Typical atrial flutter 3/13    PAST SURGICAL HISTORY:   Past Surgical History  Procedure Laterality Date  . No surgical history  06/2011    SOCIAL HISTORY:   History   Social History  . Marital Status: Divorced    Spouse Name: N/A    Number of Children: N/A  . Years of Education: N/A   Occupational History  . Not on file.   Social History Main Topics  . Smoking status: Former Smoker    Quit date: 07/15/1966  . Smokeless tobacco: Never Used  . Alcohol Use: No     Comment: Quit greater than 5 years ago  . Drug Use: No  . Sexual Activity: Not on file   Other Topics Concern  . Not on file   Social History Narrative   Lives alone.  Retired from Research officer, political party    FAMILY HISTORY:   Family Status  Relation Status Death Age  . Mother Deceased     unknown cause of death  . Father Deceased     unknown cause of death  . Sister Alive     healthy  . Child Alive     healthy    ROS:  A complete 10 system review of systems was obtained and was unremarkable apart from what is mentioned above.  PHYSICAL EXAMINATION:    VITALS:   Filed Vitals:   11/16/12 1011  BP: 126/68  Pulse: 80  Temp: 98.2 F (36.8 C)  TempSrc: Oral  Resp: 16  Height: 6' (1.829 m)  Weight: 154 lb (69.854 kg)    GEN:  Normal appears male in no acute distress.  Appears stated age. HEENT:  Normocephalic, atraumatic. The mucous membranes are moist. The superficial temporal arteries are without ropiness or tenderness. Cardiovascular: Regular rate and rhythm. Lungs: Clear to auscultation bilaterally. Neck/Heme: There are no carotid bruits noted bilaterally.  NEUROLOGICAL: Orientation:  The patient scored 19/30 on his MMSE.  He had trouble with recall and missed all of the words. Cranial nerves: There is good  facial symmetry. The pupils are equal round and reactive to light bilaterally. Fundoscopic exam reveals clear disc margins bilaterally. Extraocular muscles are intact and visual fields are full to confrontational testing. Speech is fluent but very mildly dysarthric.  Soft palate rises symmetrically and there is no tongue deviation. Hearing is intact to conversational tone. Tone: Tone is good throughout. Sensation: Sensation is intact to light touch and pinprick throughout (facial, trunk, extremities). Vibration is intact at the bilateral big toe. There is definite extinction with double simultaneous stimulation on the R. There is no sensory dermatomal level identified. Coordination:  The patient has no difficulty with RAM's or FNF bilaterally. Motor: Strength is 5/5 in the bilateral upper and lower extremities.  Shoulder shrug is equal and symmetric. There is no pronator drift.  There are no fasciculations noted. DTR's: Deep tendon reflexes are 2-2+/4 at the bilateral biceps, triceps, brachioradialis, patella and achilles.  Plantar responses are downgoing bilaterally. Gait and Station: The patient is able to ambulate without difficulty.  He does flex the right quadriceps with ambulation to pick up the knee and foot.  He has trouble with heel toe walk. The patient is unable to ambulate in a tandem fashion. The patient is able to stand in the Romberg position.  Labs:    Chemistry      Component Value Date/Time   NA 139 09/19/2012 2140   K 3.7 09/19/2012 2140   CL 100 09/19/2012 2140   CO2 28 09/19/2012 2140   BUN 11 09/19/2012 2140   CREATININE 0.68 09/19/2012 2140      Component Value Date/Time   CALCIUM 9.0 09/19/2012 2140   ALKPHOS 138* 09/19/2012 2140   AST 19 09/19/2012 2140   ALT 11 09/19/2012 2140   BILITOT 0.3 09/19/2012 2140     Lab Results  Component Value Date   WBC 4.4 09/19/2012   HGB 11.5* 09/19/2012   HCT 34.1* 09/19/2012   MCV 87.7 09/19/2012   PLT 216 09/19/2012     Lab Results  Component Value  Date   PHENYTOIN 20.0 09/19/2012     IMPRESSION:  1. seizure.  This is secondary to previous cerebral infarctions.  I am also concerned with his ability to properly administer his own medications.  His Dilantin levels have been up and down over the course of time. 2.  Memory loss.  I do think that the pt has mild-mod AD.  PLAN:  1. we will repeat a Dilantin level today. 2.  I asked the patient to hold on driving. 3.  I called the patient's daughter and talked with her on the phone.  The patient did give me permission to do this.  I had a long discussion with her.  I discussed my concerns.  With the degree of memory loss as well as with the seizures, I do not think that he shouldn't be driving.  I also don't think that he should be living alone. 4.  Seizure and safety was discussed. 5.  I talked to the patient and his daughter about the fact he is above the maximum recommended dosage of Keppra per day.  I am going to lower him to a maximum of 3000 mg per day.  His daughter is concerned about seizure, as am I, but there is no particular evidence that going above 3000 mg per day is particularly helpful, and can be harmful. 6.  Follow up is anticipated in the next few months, sooner should new neurologic issues arise. 7.  Time in visit was 30 minutes.

## 2012-11-17 ENCOUNTER — Other Ambulatory Visit: Payer: Self-pay | Admitting: Neurology

## 2012-11-17 ENCOUNTER — Telehealth: Payer: Self-pay | Admitting: Neurology

## 2012-11-17 DIAGNOSIS — R569 Unspecified convulsions: Secondary | ICD-10-CM

## 2012-11-17 LAB — PHENYTOIN LEVEL, TOTAL: Phenytoin Lvl: 38.1 ug/mL — ABNORMAL HIGH (ref 10.0–20.0)

## 2012-11-17 NOTE — Telephone Encounter (Signed)
Called and spoke with the patient's daughter, [name]. Aware that her dad's dilantin level was too high. Per Dr. Don Perking recommendation, he is to hold his dose tomorrow (Saturday), resume on Sunday with 2 po q am x one week and then have a blood level drawn. Will place the order in [facility] for 11/27/12. I let her know that I will call her dad with this same information. Spoke with the patient as well. Went over the medication changes several times yet not sure he totally understands. I told him several times that ZI had spoken with [name] and she was aware of the changes and that if he had any questions about what he should be doing to please call her. He states he will. **Dr. Arbutus Leas, just a FYI...Marland KitchenMarland Kitchen

## 2012-11-22 ENCOUNTER — Telehealth: Payer: Self-pay | Admitting: *Deleted

## 2012-11-22 NOTE — Telephone Encounter (Signed)
Received call from [name] pts daughter that they were informed by his  Pharmacy that the insurance would not pay for coumadin  refill until Sept 9th and he is completely out of coumadin did not have any to take yesterday, This nurse called Pharmacy and spoke with  Stanton Kidney and gave her above information and she checked and then stated that he could get his refill today . This nurse called and spoke with [name] instructing her that they would be able to pick up his coumadin today and that since he did not take coumadin yesterday instructed to have  pt take  extra 1/2 tablet today and [name] stated understanding .

## 2012-11-27 ENCOUNTER — Other Ambulatory Visit: Payer: Medicare Other

## 2012-11-29 ENCOUNTER — Other Ambulatory Visit: Payer: Medicare Other

## 2012-11-29 DIAGNOSIS — R569 Unspecified convulsions: Secondary | ICD-10-CM | POA: Diagnosis not present

## 2012-11-30 ENCOUNTER — Ambulatory Visit (INDEPENDENT_AMBULATORY_CARE_PROVIDER_SITE_OTHER): Payer: Medicare Other | Admitting: *Deleted

## 2012-11-30 DIAGNOSIS — Z7901 Long term (current) use of anticoagulants: Secondary | ICD-10-CM | POA: Diagnosis not present

## 2012-11-30 DIAGNOSIS — I4892 Unspecified atrial flutter: Secondary | ICD-10-CM | POA: Diagnosis not present

## 2012-11-30 LAB — PHENYTOIN LEVEL, TOTAL: Phenytoin Lvl: 15.1 ug/mL (ref 10.0–20.0)

## 2012-12-07 ENCOUNTER — Telehealth: Payer: Self-pay | Admitting: Neurology

## 2012-12-07 NOTE — Telephone Encounter (Signed)
Called [name] pt's daughter and relayed your message. She is going to call to schedule a lab appointment for 10 days from now.

## 2012-12-07 NOTE — Telephone Encounter (Signed)
Called pt's daughter Enrique Sack to tell her the lab results for her father's dilantin level drawn on 11/29/2012. She explained that he had recently stopped taking three dilantin pills a day and started taking 2 dilantin pills a day per Dr. Iona Beard orders. He started feeling bad with this change so she told him on 11/29/2012 to take 3 dilantin pills daily again and now he feels good.

## 2012-12-07 NOTE — Telephone Encounter (Signed)
That dilantin level then would not be accurate since she told him to increase it the day that it was drawn.  Please have another drawn in 10 days.  I again think that it would be beneficial if someone else could assist him with meds and he should not be living alone or driving.

## 2012-12-11 ENCOUNTER — Ambulatory Visit (INDEPENDENT_AMBULATORY_CARE_PROVIDER_SITE_OTHER): Payer: Medicare Other | Admitting: *Deleted

## 2012-12-11 DIAGNOSIS — Z7901 Long term (current) use of anticoagulants: Secondary | ICD-10-CM

## 2012-12-11 DIAGNOSIS — I4892 Unspecified atrial flutter: Secondary | ICD-10-CM | POA: Diagnosis not present

## 2013-01-08 ENCOUNTER — Ambulatory Visit (INDEPENDENT_AMBULATORY_CARE_PROVIDER_SITE_OTHER): Payer: Medicare Other | Admitting: *Deleted

## 2013-01-08 DIAGNOSIS — I4892 Unspecified atrial flutter: Secondary | ICD-10-CM | POA: Diagnosis not present

## 2013-01-08 DIAGNOSIS — Z7901 Long term (current) use of anticoagulants: Secondary | ICD-10-CM | POA: Diagnosis not present

## 2013-01-08 LAB — POCT INR: INR: 1.8

## 2013-01-08 MED ORDER — WARFARIN SODIUM 2.5 MG PO TABS: 2.5000 mg | ORAL_TABLET | ORAL | Status: DC

## 2013-01-19 ENCOUNTER — Other Ambulatory Visit: Payer: Self-pay | Admitting: Neurology

## 2013-01-19 ENCOUNTER — Encounter: Payer: Self-pay | Admitting: Neurology

## 2013-01-19 ENCOUNTER — Ambulatory Visit (INDEPENDENT_AMBULATORY_CARE_PROVIDER_SITE_OTHER): Payer: Medicare Other | Admitting: Neurology

## 2013-01-19 VITALS — BP 132/72 | HR 80 | Temp 98.5°F | Resp 12 | Ht 72.0 in | Wt 155.9 lb

## 2013-01-19 DIAGNOSIS — F0280 Dementia in other diseases classified elsewhere without behavioral disturbance: Secondary | ICD-10-CM | POA: Diagnosis not present

## 2013-01-19 DIAGNOSIS — R569 Unspecified convulsions: Secondary | ICD-10-CM | POA: Diagnosis not present

## 2013-01-19 DIAGNOSIS — G40909 Epilepsy, unspecified, not intractable, without status epilepticus: Secondary | ICD-10-CM | POA: Diagnosis not present

## 2013-01-19 MED ORDER — LEVETIRACETAM 500 MG PO TABS: ORAL_TABLET | ORAL | Status: DC

## 2013-01-19 MED ORDER — PHENYTOIN SODIUM EXTENDED 100 MG PO CAPS: ORAL_CAPSULE | ORAL | Status: DC

## 2013-01-19 NOTE — Progress Notes (Signed)
[name] was seen today in neurologic consultation at the request of Ginette Otto, MD and Meryle Ready.  The patient is new to me since Dr. Modesto Charon has left the practice and I am now resuming his care.  I had the opportunity to review Dr. Nash Dimmer prior notes.  I also have reviewed and updated all of the patient's history and medications.  The patient had a history of seizure after a cerebral infarction in 2006.  He had approximately 5 seizures between 2006 and 2009.  Once his medications were properly adjusted, he had been seizure-free until an ACA stroke in 2013.  He is currently on Keppra, 1500 mg twice a day and Dilantin 200 mg daily.  His Dilantin level was checked at last visit and was markedly elevated at 48.  His Dilantin was held for 2 days and the dosage was decreased to 200 mg and another level was rechecked and it was normal.   He reports today, however, he is taking 3 tablets daily (300 mg).   His Dilantin levels have actually been up and down for quite some time.  I asked him several times how much Keppra he was taking and, while there was some confusion, it sounded like he was taking 2250 mg a day and he should be on 3000 per day.  His original seizures are described as speech arrest and behavioral arrest with a prolonged post ictal phase of difficulty speaking for at least a day. His daughter says it takes about 2 days for him to get back to normal.   Since last visit, the patient reports 2 events where he was "out of it for 10 minutes."  He cannot tell me what this means.  There was no LOC, no loss of bladder control.  No one was with him.  He can only say that he could not function. He did not try to walk, is not sure if he was confused and did not try to speak.   The last event was 2-3 weeks ago.  He reports good medication compliance.  The patient lives alone, drives and goes out to eat.  His sister lives a few miles from him.  His daughter lives in [place].  He administers  his own medication.  11/16/12:  The patient is following up today.  He has not been seen in our clinic since September, 2013 (one year ago).  He was scheduled for an appointment yesterday, but called two and a half hours after the appointment time and reported that he got lost and did not know how to get to the office.  He does state that he was in the building but couldn't find our suite number.   I did review records made available to me since his last visit.  I see that the patient was seen in the ER after a possible seizure on July 1.  ER records indicate that he was driving and felt like he might have a seizure.  Pt doesn't remember sx's at all today.  About once per month, he does feel "out of it" for about 10 min.   His Dilantin level was 20.0.  Unfortunately, no one accompanies him today.  We had talked previously to his daughter who is in [place] and I was hoping that she could accompany the patient.  There remains some confusion on his overall Keppra dosage.  According to his daughter on the phone previously, the patient is taking 1750 mg of Keppra twice daily.  This is above the recommended dosage of 3000 mg daily.  The pt was able to corrobate this.  He doesn't know names of drugs but does know how many pills he takes per day.    01/19/13 update:  The pt returns today, again unaccompanied.  The pt reports that he had an event in the last 10 days.  He cannot describe it.  He just "cannot do anything" during an event.  It last 10 or 15 minutes.  When asked directly, he states that he can speak clearly during an event, but is unsure if he could walk.  He does not feel dizzy or lightheaded.  It happens one or 2 times per month.  Last visit, when I checked his Dilantin it was 38.1 and had him decrease his Dilantin.  He did that until he had one of these spells and then went back up on his own.  The same day that he went back on it, his Dilantin level was rechecked and it was 15.1, but he had only been on  the higher dose for less than one day.  He remains on Keppra 1500 mg twice daily.  He continues to live alone and drives.  He feels that his memory is good.  He goes out for his midday meal and otherwise just eats ASAP.  He does not Cook.  ALLERGIES:  No Known Allergies  CURRENT MEDICATIONS:  Current Outpatient Prescriptions on File Prior to Visit  Medication Sig Dispense Refill  . levETIRAcetam (KEPPRA) 500 MG tablet 3 tabs bid  180 tablet  1  . phenytoin (DILANTIN) 100 MG ER capsule Take 3 capsules (300 mg total) every morning.  90 capsule  1  . warfarin (COUMADIN) 2.5 MG tablet Take 1 tablet (2.5 mg total) by mouth as directed.  60 tablet  3   No current facility-administered medications on file prior to visit.    PAST MEDICAL HISTORY:   Past Medical History  Diagnosis Date  . Seizures   . Stroke   . Pancreatitis   . Hiatal hernia   . HTN (hypertension)     pt denies h/o HTN though it appears he was started on spironolactone during his hospitalization 3/13  . Typical atrial flutter 3/13    PAST SURGICAL HISTORY:   Past Surgical History  Procedure Laterality Date  . No surgical history  06/2011    SOCIAL HISTORY:   History   Social History  . Marital Status: Divorced    Spouse Name: N/A    Number of Children: N/A  . Years of Education: N/A   Occupational History  . Not on file.   Social History Main Topics  . Smoking status: Former Smoker    Quit date: 07/15/1966  . Smokeless tobacco: Never Used  . Alcohol Use: No     Comment: Quit greater than 5 years ago  . Drug Use: No  . Sexual Activity: Not on file   Other Topics Concern  . Not on file   Social History Narrative   Lives alone.  Retired from [employer], political party    FAMILY HISTORY:   Family Status  Relation Status Death Age  . Mother Deceased     unknown cause of death  . Father Deceased     unknown cause of death  . Sister Alive     healthy  . Child Alive     healthy    ROS:  A complete 10  system review of systems was obtained  and was unremarkable apart from what is mentioned above.  PHYSICAL EXAMINATION:    VITALS:   Filed Vitals:   01/19/13 1057  BP: 132/72  Pulse: 80  Temp: 98.5 F (36.9 C)  Resp: 12  Height: 6' (1.829 m)  Weight: 155 lb 14.4 oz (70.716 kg)    GEN:  Normal appears male in no acute distress.  Appears stated age. HEENT:  Normocephalic, atraumatic. The mucous membranes are moist. The superficial temporal arteries are without ropiness or tenderness. Cardiovascular: Regular rate and rhythm. Lungs: Clear to auscultation bilaterally. Neck/Heme: There are no carotid bruits noted bilaterally.  NEUROLOGICAL: Orientation:  The patient scored 19/30 on his MMSE last visit.  Today, he was able to correctly state the month/date/year.  When asked to draw a clock, he does not place all the numbers on the clock and is unable to position the hands in the clock. Cranial nerves: There is good facial symmetry. The pupils are equal round and reactive to light bilaterally. Fundoscopic exam reveals clear disc margins bilaterally. Extraocular muscles are intact and visual fields are full to confrontational testing. Speech is fluent but very mildly dysarthric. Soft palate rises symmetrically and there is no tongue deviation. Hearing is intact to conversational tone. Tone: Tone is good throughout. Sensation: Sensation is intact to light touch and pinprick throughout (facial, trunk, extremities). Vibration is intact at the bilateral big toe. There is definite extinction with double simultaneous stimulation on the R. There is no sensory dermatomal level identified. Coordination:  The patient has no difficulty with RAM's or FNF bilaterally. Motor: Strength is 5/5 in the bilateral upper and lower extremities.  Shoulder shrug is equal and symmetric. There is no pronator drift.  There are no fasciculations noted. DTR's: Deep tendon reflexes are 2-2+/4 at the bilateral biceps, triceps,  brachioradialis, patella and achilles.  Plantar responses are downgoing bilaterally. Gait and Station: The patient is able to ambulate without difficulty.  He does flex the right quadriceps with ambulation to pick up the knee and foot.    Labs:    Chemistry      Component Value Date/Time   NA 139 09/19/2012 2140   K 3.7 09/19/2012 2140   CL 100 09/19/2012 2140   CO2 28 09/19/2012 2140   BUN 11 09/19/2012 2140   CREATININE 0.68 09/19/2012 2140      Component Value Date/Time   CALCIUM 9.0 09/19/2012 2140   ALKPHOS 138* 09/19/2012 2140   AST 19 09/19/2012 2140   ALT 11 09/19/2012 2140   BILITOT 0.3 09/19/2012 2140     Lab Results  Component Value Date   WBC 4.4 09/19/2012   HGB 11.5* 09/19/2012   HCT 34.1* 09/19/2012   MCV 87.7 09/19/2012   PLT 216 09/19/2012     Lab Results  Component Value Date   PHENYTOIN 15.1 11/29/2012     IMPRESSION:  1. seizure.  This is secondary to previous cerebral infarctions.  I am also concerned with his ability to properly administer his own medications.  His Dilantin levels have been up and down over the course of time. 2.  Memory loss.  I do think that the pt has mild-mod AD.  PLAN:  1. we will repeat a Dilantin level today. 2.  I asked the patient to hold on driving. 3.  I do not think that the patient should be living alone were administered his own medication.  I have previously talked about this with his daughter. 4.  I wanted to start  the patient on an acetylcholinesterase inhibitor, but he refused stating that his memory is "okay." 5.  next visit, I am going to have the patient see Dr. Karel Jarvis, our new epileptologist.

## 2013-01-24 LAB — PHENYTOIN LEVEL, FREE AND TOTAL: Phenytoin Bound: 23.2 mg/L

## 2013-01-26 ENCOUNTER — Other Ambulatory Visit: Payer: Self-pay | Admitting: Neurology

## 2013-01-29 ENCOUNTER — Ambulatory Visit (INDEPENDENT_AMBULATORY_CARE_PROVIDER_SITE_OTHER): Payer: Medicare Other | Admitting: Pharmacist

## 2013-01-29 DIAGNOSIS — Z7901 Long term (current) use of anticoagulants: Secondary | ICD-10-CM

## 2013-01-29 DIAGNOSIS — I4892 Unspecified atrial flutter: Secondary | ICD-10-CM | POA: Diagnosis not present

## 2013-01-29 LAB — POCT INR: INR: 2

## 2013-01-30 ENCOUNTER — Other Ambulatory Visit: Payer: Self-pay | Admitting: Neurology

## 2013-01-30 NOTE — Telephone Encounter (Signed)
[name], call pharmacy if they are requesting refills.  Looks like I filled 10/31 with 5 refills.

## 2013-02-19 ENCOUNTER — Ambulatory Visit (INDEPENDENT_AMBULATORY_CARE_PROVIDER_SITE_OTHER): Payer: Medicare Other | Admitting: *Deleted

## 2013-02-19 DIAGNOSIS — I4892 Unspecified atrial flutter: Secondary | ICD-10-CM

## 2013-02-19 DIAGNOSIS — Z7901 Long term (current) use of anticoagulants: Secondary | ICD-10-CM

## 2013-03-23 ENCOUNTER — Ambulatory Visit (INDEPENDENT_AMBULATORY_CARE_PROVIDER_SITE_OTHER): Payer: Medicare Other | Admitting: Pharmacist

## 2013-03-23 DIAGNOSIS — Z7901 Long term (current) use of anticoagulants: Secondary | ICD-10-CM | POA: Diagnosis not present

## 2013-03-23 DIAGNOSIS — I4892 Unspecified atrial flutter: Secondary | ICD-10-CM | POA: Diagnosis not present

## 2013-03-23 LAB — POCT INR: INR: 2.1

## 2013-04-03 DIAGNOSIS — Z1331 Encounter for screening for depression: Secondary | ICD-10-CM | POA: Diagnosis not present

## 2013-04-03 DIAGNOSIS — Z23 Encounter for immunization: Secondary | ICD-10-CM | POA: Diagnosis not present

## 2013-04-03 DIAGNOSIS — I6990 Unspecified sequelae of unspecified cerebrovascular disease: Secondary | ICD-10-CM | POA: Diagnosis not present

## 2013-04-03 DIAGNOSIS — D649 Anemia, unspecified: Secondary | ICD-10-CM | POA: Diagnosis not present

## 2013-04-03 DIAGNOSIS — R569 Unspecified convulsions: Secondary | ICD-10-CM | POA: Diagnosis not present

## 2013-04-03 DIAGNOSIS — Z0000 Encounter for general adult medical examination without abnormal findings: Secondary | ICD-10-CM | POA: Diagnosis not present

## 2013-04-03 DIAGNOSIS — Z79899 Other long term (current) drug therapy: Secondary | ICD-10-CM | POA: Diagnosis not present

## 2013-04-30 ENCOUNTER — Ambulatory Visit: Payer: Medicare Other | Admitting: Neurology

## 2013-04-30 DIAGNOSIS — R569 Unspecified convulsions: Secondary | ICD-10-CM | POA: Diagnosis not present

## 2013-05-01 ENCOUNTER — Telehealth: Payer: Self-pay | Admitting: Neurology

## 2013-05-01 NOTE — Telephone Encounter (Signed)
Pt no showed consult appt w/ Dr. [name] scheduled for 04/30/13. Referred by [name]. No show letter mailed to pt / Sherri

## 2013-05-01 NOTE — Telephone Encounter (Signed)
He has a hx of similar with me but I think that most is related to a memory problem.  Did you guys confirm with him?

## 2013-05-04 ENCOUNTER — Ambulatory Visit (INDEPENDENT_AMBULATORY_CARE_PROVIDER_SITE_OTHER): Payer: Medicare Other | Admitting: *Deleted

## 2013-05-04 DIAGNOSIS — I4892 Unspecified atrial flutter: Secondary | ICD-10-CM | POA: Diagnosis not present

## 2013-05-04 DIAGNOSIS — Z7901 Long term (current) use of anticoagulants: Secondary | ICD-10-CM | POA: Diagnosis not present

## 2013-05-04 LAB — POCT INR: INR: 2.6

## 2013-05-10 DIAGNOSIS — R569 Unspecified convulsions: Secondary | ICD-10-CM | POA: Diagnosis not present

## 2013-05-16 DIAGNOSIS — R569 Unspecified convulsions: Secondary | ICD-10-CM | POA: Diagnosis not present

## 2013-05-17 ENCOUNTER — Ambulatory Visit: Payer: Medicare Other | Admitting: Neurology

## 2013-06-15 ENCOUNTER — Ambulatory Visit (INDEPENDENT_AMBULATORY_CARE_PROVIDER_SITE_OTHER): Payer: Medicare Other | Admitting: Pharmacist

## 2013-06-15 DIAGNOSIS — I4892 Unspecified atrial flutter: Secondary | ICD-10-CM

## 2013-06-15 DIAGNOSIS — Z7901 Long term (current) use of anticoagulants: Secondary | ICD-10-CM | POA: Diagnosis not present

## 2013-06-15 LAB — POCT INR: INR: 1.6

## 2013-07-13 ENCOUNTER — Ambulatory Visit (INDEPENDENT_AMBULATORY_CARE_PROVIDER_SITE_OTHER): Payer: Medicare Other | Admitting: *Deleted

## 2013-07-13 DIAGNOSIS — Z7901 Long term (current) use of anticoagulants: Secondary | ICD-10-CM

## 2013-07-13 DIAGNOSIS — I4892 Unspecified atrial flutter: Secondary | ICD-10-CM

## 2013-07-13 LAB — POCT INR: INR: 3

## 2013-07-13 MED ORDER — WARFARIN SODIUM 2.5 MG PO TABS: 2.5000 mg | ORAL_TABLET | ORAL | Status: DC

## 2013-07-16 ENCOUNTER — Other Ambulatory Visit: Payer: Self-pay | Admitting: Internal Medicine

## 2013-07-25 ENCOUNTER — Other Ambulatory Visit: Payer: Self-pay | Admitting: Neurology

## 2013-07-25 NOTE — Telephone Encounter (Signed)
Keppra refill requested. Per last office note- patient to remain on medication. Refill approved and sent to patient's pharmacy with a note for patient to make appt for further refills.

## 2013-07-27 DIAGNOSIS — R569 Unspecified convulsions: Secondary | ICD-10-CM | POA: Diagnosis not present

## 2013-07-27 DIAGNOSIS — I4892 Unspecified atrial flutter: Secondary | ICD-10-CM | POA: Diagnosis not present

## 2013-07-27 DIAGNOSIS — I10 Essential (primary) hypertension: Secondary | ICD-10-CM | POA: Diagnosis not present

## 2013-07-27 DIAGNOSIS — N529 Male erectile dysfunction, unspecified: Secondary | ICD-10-CM | POA: Diagnosis not present

## 2013-08-10 ENCOUNTER — Ambulatory Visit (INDEPENDENT_AMBULATORY_CARE_PROVIDER_SITE_OTHER): Payer: Medicare Other | Admitting: *Deleted

## 2013-08-10 DIAGNOSIS — Z7901 Long term (current) use of anticoagulants: Secondary | ICD-10-CM | POA: Diagnosis not present

## 2013-08-10 DIAGNOSIS — I4892 Unspecified atrial flutter: Secondary | ICD-10-CM | POA: Diagnosis not present

## 2013-08-10 DIAGNOSIS — Z5181 Encounter for therapeutic drug level monitoring: Secondary | ICD-10-CM | POA: Diagnosis not present

## 2013-08-10 LAB — POCT INR: INR: 1.7

## 2013-08-15 DIAGNOSIS — R569 Unspecified convulsions: Secondary | ICD-10-CM | POA: Diagnosis not present

## 2013-08-16 ENCOUNTER — Other Ambulatory Visit: Payer: Self-pay | Admitting: Neurology

## 2013-08-16 NOTE — Telephone Encounter (Signed)
Please advise if okay to give one refill? Looks like you referred patient to Dr [name] and he did not show up to appt. No appt scheduled. Please advise.

## 2013-08-17 NOTE — Telephone Encounter (Signed)
Send certified letter that this will be last RX without appt with Dr. [name].  One month supply only.

## 2013-08-17 NOTE — Telephone Encounter (Signed)
RX sent for one month and letter sent to patient.

## 2013-08-24 ENCOUNTER — Ambulatory Visit (INDEPENDENT_AMBULATORY_CARE_PROVIDER_SITE_OTHER): Payer: Medicare Other | Admitting: *Deleted

## 2013-08-24 DIAGNOSIS — Z5181 Encounter for therapeutic drug level monitoring: Secondary | ICD-10-CM | POA: Diagnosis not present

## 2013-08-24 DIAGNOSIS — I4892 Unspecified atrial flutter: Secondary | ICD-10-CM | POA: Diagnosis not present

## 2013-08-24 DIAGNOSIS — Z7901 Long term (current) use of anticoagulants: Secondary | ICD-10-CM

## 2013-08-24 LAB — POCT INR: INR: 2.4

## 2013-09-14 ENCOUNTER — Ambulatory Visit (INDEPENDENT_AMBULATORY_CARE_PROVIDER_SITE_OTHER): Payer: Medicare Other | Admitting: Pharmacist

## 2013-09-14 DIAGNOSIS — Z5181 Encounter for therapeutic drug level monitoring: Secondary | ICD-10-CM | POA: Diagnosis not present

## 2013-09-14 DIAGNOSIS — I4892 Unspecified atrial flutter: Secondary | ICD-10-CM | POA: Diagnosis not present

## 2013-09-14 DIAGNOSIS — Z7901 Long term (current) use of anticoagulants: Secondary | ICD-10-CM

## 2013-09-14 LAB — POCT INR: INR: 2.6

## 2013-09-25 ENCOUNTER — Other Ambulatory Visit: Payer: Self-pay | Admitting: Neurology

## 2013-10-01 ENCOUNTER — Telehealth: Payer: Self-pay | Admitting: Neurology

## 2013-10-01 MED ORDER — LEVETIRACETAM 500 MG PO TABS: 1500.0000 mg | ORAL_TABLET | Freq: Two times a day (BID) | ORAL | Status: DC

## 2013-10-01 NOTE — Telephone Encounter (Signed)
[name], lets talk about this patient before you see him

## 2013-10-01 NOTE — Telephone Encounter (Signed)
Patient aware RX has been denied due to no show's with our office. They have appt on 10/09/13 with Dr [name]. Enough Keppra was sent to pharmacy to last til appt. They were made aware if they do not keep appt we can not keep prescribing medication.

## 2013-10-01 NOTE — Telephone Encounter (Signed)
Pt's daughater called requesting a refill for LEVETRIACETAM 500mg  3tablets bid  Pharmacy: [facility] [address] [phone]

## 2013-10-09 ENCOUNTER — Encounter: Payer: Self-pay | Admitting: Neurology

## 2013-10-09 ENCOUNTER — Ambulatory Visit (INDEPENDENT_AMBULATORY_CARE_PROVIDER_SITE_OTHER): Payer: Medicare Other | Admitting: Neurology

## 2013-10-09 VITALS — BP 120/58 | HR 104 | Ht 69.69 in | Wt 141.1 lb

## 2013-10-09 DIAGNOSIS — R569 Unspecified convulsions: Secondary | ICD-10-CM

## 2013-10-09 DIAGNOSIS — I63132 Cerebral infarction due to embolism of left carotid artery: Secondary | ICD-10-CM

## 2013-10-09 DIAGNOSIS — I63239 Cerebral infarction due to unspecified occlusion or stenosis of unspecified carotid arteries: Secondary | ICD-10-CM | POA: Diagnosis not present

## 2013-10-09 MED ORDER — LEVETIRACETAM 500 MG PO TABS: 1500.0000 mg | ORAL_TABLET | Freq: Two times a day (BID) | ORAL | Status: DC

## 2013-10-09 MED ORDER — PHENYTOIN SODIUM EXTENDED 100 MG PO CAPS: ORAL_CAPSULE | ORAL | Status: DC

## 2013-10-09 NOTE — Progress Notes (Addendum)
NEUROLOGY FOLLOW UP OFFICE NOTE  [name] [id number]  HISTORY OF PRESENT ILLNESS: I had the pleasure of seeing [name] in follow-up in the neurology clinic on 10/09/2013.  The patient was last seen by one of my partners [name] almost a year ago last October 2014.  He is accompanied by his daughter today.  Records and images were personally reviewed where available.    [name] had a history of seizures after a cerebral infarction in 2006. He had approximately 5 seizures between 2006 and 2009. Once his medications were properly adjusted, he had been seizure-free until an [place] stroke in 2013. He is currently on Keppra 1500 mg twice a day and Dilantin 300 mg daily.  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 His daughter says it takes about 2 days for him to get back to normal. They deny any of those types of seizures, however he continues to report "smaller" ones that come on gradually where he "cannot do anything."  He states he can talk during those times and denies any confusion, but "the book is not there."  He had been seizure-free for many months until he decided to reduce the evening Keppra dose by 1 tablet, and had 2 seizures recently.  Around 2 weeks ago, he reports a longer episode that was unwitnessed, he sat down and let it pass.  The last seizure was 1 week ago, lasting 10 minutes.  He denies any injuries with these, no tongue bite, urinary incontinence, no falls.  He and his daughter deny any history of convulsive seizures.  His daughter has not witnessed these "smaller" seizures.  After the last seizure 1 week ago, he went back to [name] 1500mg  BID and continued Dilantin 300mg  daily.    He denies any olfactory/gustatory hallucinations, focal numbness/tingling/weakness.  He lives alone, drives and goes out to eat.  He administers his  own medication. On review of prior records, he has had significant fluctuations in Dilantin level, concerning for possible medication confusion that he would take additional medication.  Last Dilantin level on 01/19/13 was 28.3. He had ranged from 15.1 to 38.1.  Concern for memory issues have been discussed with his family in the past by [name], last August 2014 he called two and a half hours after the appointment time and reported that he got lost and did not know how to get to the office. He stated he was in the building but couldn't find our suite number. He had some difficulties in the past with regards to recalling the medications he is taking, however today he brings his medication bottles and tells me how he takes the Keppra, Dilantin, and Coumadin.    He denies any dizziness, diplopia, headaches, dysarthria, dysphagia, neck/back pain, bowel/bladder dysfunction.  He denies any falls.  He drives and denies getting lost.  He does not cook and goes out to eat.    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  MRI brain from 06/02/11 showed acute infarct in medial left frontal lobe. No EEGs available for review   PAST MEDICAL HISTORY: Past Medical History  Diagnosis Date  . Seizures   . Stroke   . Pancreatitis   . Hiatal hernia   .  HTN (hypertension)     pt denies h/o HTN though it appears he was started on spironolactone during his hospitalization 3/13  . Typical atrial flutter 3/13    MEDICATIONS: Current Outpatient Prescriptions on File Prior to Visit  Medication Sig Dispense Refill  . warfarin (COUMADIN) 2.5 MG tablet TAKE 1 TABLET BY MOUTH AS DIRECTED.  60 tablet  3   Keppra 1500mg  BID Dilantin 300mg  every morning    ALLERGIES: No Known Allergies  FAMILY HISTORY: Family History  Problem Relation Age of Onset  .  Hypertension      SOCIAL HISTORY: History   Social History  . Marital Status: Divorced    Spouse Name: N/A    Number of Children: N/A  . Years of Education: N/A   Occupational History  . Not on file.   Social History Main Topics  . Smoking status: Former Smoker    Quit date: 07/15/1966  . Smokeless tobacco: Never Used  . Alcohol Use: No     Comment: Quit greater than 5 years ago  . Drug Use: No  . Sexual Activity: Not on file   Other Topics Concern  . Not on file   Social History Narrative   Lives alone.  Retired from [employer]    REVIEW OF SYSTEMS: Constitutional: No fevers, chills, or sweats, no generalized fatigue, change in appetite Eyes: No visual changes, double vision, eye pain Ear, nose and throat: No hearing loss, ear pain, nasal congestion, sore throat Cardiovascular: No chest pain, palpitations Respiratory:  No shortness of breath at rest or with exertion, wheezes GastrointestinaI: No nausea, vomiting, diarrhea, abdominal pain, fecal incontinence Genitourinary:  No dysuria, urinary retention or frequency Musculoskeletal:  No neck pain, back pain Integumentary: No rash, pruritus, skin lesions Neurological: as above Psychiatric: No depression, insomnia, anxiety Endocrine: No palpitations, fatigue, diaphoresis, mood swings, change in appetite, change in weight, increased thirst Hematologic/Lymphatic:  No anemia, purpura, petechiae. Allergic/Immunologic: no itchy/runny eyes, nasal congestion, recent allergic reactions, rashes  PHYSICAL EXAM: Filed Vitals:   10/09/13 1236  BP: 120/58  Pulse: 104   General: No acute distress Head:  Normocephalic/atraumatic Neck: supple, no paraspinal tenderness, full range of motion Heart:  Regular rate and rhythm Lungs:  Clear to auscultation bilaterally Back: No paraspinal tenderness Skin/Extremities: No rash, no edema Neurological Exam: alert and oriented to person, place, and time. No aphasia or dysarthria.  Fund of knowledge is appropriate.  Recent and remote memory are intact.  Attention and concentration are normal.    Able to name objects and repeat phrases.  MMSE 24/30.  He had difficulty with clock drawing, doing it well until he got to "9", then started going backward with the numbers (see attached). He is able to name high frequency objects, however had difficulty naming "button" saying "wutton" several times until he said the correct word.  He also called "sleeve" as "cleeve" MMSE - Mini Mental State Exam 10/09/2013 11/16/2012  Orientation to time 5 3  Orientation to Place 5 5  Registration 3 3  Attention/ Calculation 1 0  Recall 1 0  Language- name 2 objects 2 2  Language- repeat 1 0  Language- follow 3 step command 3 3  Language- read & follow direction 1 1  Write a sentence 1 1  Copy design 1 1  Total score 24 19   Cranial nerves: Pupils equal, round, reactive to light.  Fundoscopic exam unremarkable, no papilledema. Extraocular movements intact with no nystagmus. Visual fields full. Facial sensation intact.  No facial asymmetry. Tongue, uvula, palate midline.  Motor: Bulk and tone normal, muscle strength 5/5 throughout with no pronator drift.  Sensation to light touch, temperature intact. Decreased vibration in stocking distribution up to ankles bilaterally.  No extinction to double simultaneous stimulation.  Deep tendon reflexes 2+ throughout except for absent ankle jerks bilaterally, toes downgoing.  Finger to nose testing intact.  Gait narrow-based and steady, able to tandem walk adequately.  Romberg negative.  IMPRESSION: This is a 73 year old right-handed man with a history of atrial flutter on chronic anticoagulation, left insular stroke with subsequent partial seizures that rarely secondarily generalize.  He has not had any convulsions since 2009.  He continues to report "smaller" seizures where he "cannot do anything" occurring every few months, last episode was 1 week ago when he  self-reduced Keppra. He is now back on Keppra 1500mg  BID and continues on Dilantin 300mg  daily.  In the past he had fluctuating Dilantin levels, unclear if due to confusion with medications versus interaction with Coumadin.  A trough Dilantin and Keppra level will be checked to establish baseline.  We discussed [place] driving laws that one should not drive until 6 months seizure-free.  We also discussed his memory issues, suspect mild AD.  I had discussed with the patient and his daughter having a family discussion regarding his living situation, I do not recommend he live alone, particularly since he should not drive.  His daughter expressed understanding.  He will follow-up in 6 months.  Thank you for allowing me to participate in his care.  Please do not hesitate to call for any questions or concerns.  The duration of this appointment visit was 40 minutes of face-to-face time with the patient.  Greater than 50% of this time was spent in counseling, explanation of diagnosis, planning of further management, and coordination of care.   Ellouise Newer, [name]

## 2013-10-09 NOTE — Patient Instructions (Signed)
1. Continue Keppra 500mg : Take 3 tablets twice a day 2. Continue Dilantin 100mg : Take 3 capsules every morning 3. Have bloodwork for Dilantin and Keppra level done 4. As per [place] driving laws, one should not drive until 6 months seizure-free

## 2013-10-11 DIAGNOSIS — R569 Unspecified convulsions: Secondary | ICD-10-CM | POA: Diagnosis not present

## 2013-10-12 LAB — PHENYTOIN LEVEL, TOTAL: PHENYTOIN LVL: 22.4 ug/mL — AB (ref 10.0–20.0)

## 2013-10-14 LAB — LEVETIRACETAM LEVEL: KEPPRA (LEVETIRACETAM): 28.5 ug/mL

## 2013-10-15 ENCOUNTER — Ambulatory Visit (INDEPENDENT_AMBULATORY_CARE_PROVIDER_SITE_OTHER): Payer: Medicare Other | Admitting: Pharmacist

## 2013-10-15 DIAGNOSIS — Z5181 Encounter for therapeutic drug level monitoring: Secondary | ICD-10-CM

## 2013-10-15 DIAGNOSIS — Z7901 Long term (current) use of anticoagulants: Secondary | ICD-10-CM | POA: Diagnosis not present

## 2013-10-15 DIAGNOSIS — I4892 Unspecified atrial flutter: Secondary | ICD-10-CM | POA: Diagnosis not present

## 2013-10-15 LAB — POCT INR: INR: 2.7

## 2013-11-19 ENCOUNTER — Ambulatory Visit (INDEPENDENT_AMBULATORY_CARE_PROVIDER_SITE_OTHER): Payer: Medicare Other

## 2013-11-19 ENCOUNTER — Encounter: Payer: Self-pay | Admitting: Internal Medicine

## 2013-11-19 ENCOUNTER — Ambulatory Visit (INDEPENDENT_AMBULATORY_CARE_PROVIDER_SITE_OTHER): Payer: Medicare Other | Admitting: Internal Medicine

## 2013-11-19 VITALS — BP 146/82 | HR 92 | Ht 72.0 in | Wt 139.0 lb

## 2013-11-19 DIAGNOSIS — Z5181 Encounter for therapeutic drug level monitoring: Secondary | ICD-10-CM

## 2013-11-19 DIAGNOSIS — I4892 Unspecified atrial flutter: Secondary | ICD-10-CM

## 2013-11-19 DIAGNOSIS — Z7901 Long term (current) use of anticoagulants: Secondary | ICD-10-CM

## 2013-11-19 DIAGNOSIS — I483 Typical atrial flutter: Secondary | ICD-10-CM

## 2013-11-19 DIAGNOSIS — I63239 Cerebral infarction due to unspecified occlusion or stenosis of unspecified carotid arteries: Secondary | ICD-10-CM | POA: Diagnosis not present

## 2013-11-19 LAB — POCT INR: INR: 2.5

## 2013-11-19 NOTE — Progress Notes (Signed)
PCP:  [name], MD  The patient presents today for routine cardiology followup.  He is doing well.  He is unaware of any recent atrial arrhythmias.  He continues to take coumadin.  Today, he denies symptoms of  chest pain, shortness of breath,  lower extremity edema (above baseline) dizziness, presyncope, syncope, or further neurologic sequela.    The patient feels that he is tolerating medications without difficulties and is otherwise without complaint today.   Past Medical History  Diagnosis Date  . Seizures   . Stroke   . Pancreatitis   . Hiatal hernia   . HTN (hypertension)     pt denies h/o HTN though it appears he was started on spironolactone during his hospitalization 3/13  . Typical atrial flutter 3/13   Past Surgical History  Procedure Laterality Date  . No surgical history  06/2011    Current Outpatient Prescriptions  Medication Sig Dispense Refill  . levETIRAcetam (KEPPRA) 500 MG tablet Take 1,500 mg by mouth 2 (two) times daily.      . phenytoin (DILANTIN) 100 MG ER capsule Take 3 capsules every morning  90 capsule  11  . warfarin (COUMADIN) 2.5 MG tablet TAKE 1 TABLET BY MOUTH AS DIRECTED BY THE COUMADIN CLINIC       No current facility-administered medications for this visit.    No Known Allergies  History   Social History  . Marital Status: Divorced    Spouse Name: N/A    Number of Children: N/A  . Years of Education: N/A   Occupational History  . Not on file.   Social History Main Topics  . Smoking status: Former Smoker    Quit date: 07/15/1966  . Smokeless tobacco: Never Used  . Alcohol Use: No     Comment: Quit greater than 5 years ago  . Drug Use: No  . Sexual Activity: Not on file   Other Topics Concern  . Not on file   Social History Narrative   Lives alone.  Retired from [employer]    Family History  Problem Relation Age of Onset  . Hypertension      Physical Exam: Filed Vitals:   11/19/13 1539  BP: 146/82   Pulse: 92  Height: 6' (1.829 m)  Weight: 139 lb (63.05 kg)    GEN- The patient is well appearing, alert and oriented x 3 today.   Head- normocephalic, atraumatic Eyes-  Sclera clear, conjunctiva pink Ears- hearing intact Oropharynx- clear Lungs- Clear to ausculation bilaterally, normal work of breathing Heart- Regular rate and rhythm, no murmurs, rubs or gallops, PMI not laterally displaced GI- soft, NT, ND, + BS Extremities- no clubbing, cyanosis, trace ankle edema  ekg today reveals sinus rhythm 92 bpm, incomplete RBBB, otherwise normal ekg  Assessment and Plan:  1. Atrial flutter Well controlled He has previously declined ablation and would prefer to continue coumadin at this time.  He will contact my office if he decides to consider ablation Continue coumadin  Return in 1 year

## 2013-11-19 NOTE — Patient Instructions (Signed)
Your physician wants you to follow-up in: 12 months with Dr Allred You will receive a reminder letter in the mail two months in advance. If you don't receive a letter, please call our office to schedule the follow-up appointment.  

## 2013-12-03 ENCOUNTER — Other Ambulatory Visit: Payer: Self-pay | Admitting: Internal Medicine

## 2013-12-31 ENCOUNTER — Ambulatory Visit (INDEPENDENT_AMBULATORY_CARE_PROVIDER_SITE_OTHER): Payer: Medicare Other | Admitting: *Deleted

## 2013-12-31 DIAGNOSIS — I483 Typical atrial flutter: Secondary | ICD-10-CM | POA: Diagnosis not present

## 2013-12-31 DIAGNOSIS — I4892 Unspecified atrial flutter: Secondary | ICD-10-CM

## 2013-12-31 DIAGNOSIS — Z7901 Long term (current) use of anticoagulants: Secondary | ICD-10-CM

## 2013-12-31 DIAGNOSIS — Z5181 Encounter for therapeutic drug level monitoring: Secondary | ICD-10-CM | POA: Diagnosis not present

## 2013-12-31 LAB — POCT INR: INR: 2.7

## 2014-02-09 ENCOUNTER — Other Ambulatory Visit: Payer: Self-pay | Admitting: Neurology

## 2014-02-11 ENCOUNTER — Ambulatory Visit (INDEPENDENT_AMBULATORY_CARE_PROVIDER_SITE_OTHER): Payer: Medicare Other | Admitting: Pharmacist

## 2014-02-11 DIAGNOSIS — Z5181 Encounter for therapeutic drug level monitoring: Secondary | ICD-10-CM | POA: Diagnosis not present

## 2014-02-11 DIAGNOSIS — Z7901 Long term (current) use of anticoagulants: Secondary | ICD-10-CM | POA: Diagnosis not present

## 2014-02-11 DIAGNOSIS — I483 Typical atrial flutter: Secondary | ICD-10-CM | POA: Diagnosis not present

## 2014-02-11 DIAGNOSIS — I4892 Unspecified atrial flutter: Secondary | ICD-10-CM

## 2014-02-11 LAB — POCT INR: INR: 2.4

## 2014-02-11 NOTE — Telephone Encounter (Signed)
Ok to refill, Keppra 1500mg  BID, Dispense #180 with 6 refills. Thanks

## 2014-02-11 NOTE — Telephone Encounter (Signed)
Dr [name] - looks like patient has transferred care to you. Please advise.

## 2014-03-25 ENCOUNTER — Ambulatory Visit (INDEPENDENT_AMBULATORY_CARE_PROVIDER_SITE_OTHER): Payer: Medicare Other | Admitting: *Deleted

## 2014-03-25 DIAGNOSIS — Z7901 Long term (current) use of anticoagulants: Secondary | ICD-10-CM | POA: Diagnosis not present

## 2014-03-25 DIAGNOSIS — Z5181 Encounter for therapeutic drug level monitoring: Secondary | ICD-10-CM | POA: Diagnosis not present

## 2014-03-25 DIAGNOSIS — I483 Typical atrial flutter: Secondary | ICD-10-CM

## 2014-03-25 DIAGNOSIS — I4892 Unspecified atrial flutter: Secondary | ICD-10-CM

## 2014-03-25 LAB — POCT INR: INR: 1.5

## 2014-04-05 DIAGNOSIS — Z23 Encounter for immunization: Secondary | ICD-10-CM | POA: Diagnosis not present

## 2014-04-05 DIAGNOSIS — Z79899 Other long term (current) drug therapy: Secondary | ICD-10-CM | POA: Diagnosis not present

## 2014-04-05 DIAGNOSIS — I10 Essential (primary) hypertension: Secondary | ICD-10-CM | POA: Diagnosis not present

## 2014-04-05 DIAGNOSIS — G40909 Epilepsy, unspecified, not intractable, without status epilepticus: Secondary | ICD-10-CM | POA: Diagnosis not present

## 2014-04-05 DIAGNOSIS — I6930 Unspecified sequelae of cerebral infarction: Secondary | ICD-10-CM | POA: Diagnosis not present

## 2014-04-05 DIAGNOSIS — Z0000 Encounter for general adult medical examination without abnormal findings: Secondary | ICD-10-CM | POA: Diagnosis not present

## 2014-04-05 DIAGNOSIS — Z1389 Encounter for screening for other disorder: Secondary | ICD-10-CM | POA: Diagnosis not present

## 2014-04-08 ENCOUNTER — Ambulatory Visit (INDEPENDENT_AMBULATORY_CARE_PROVIDER_SITE_OTHER): Payer: Medicare Other

## 2014-04-08 DIAGNOSIS — Z7901 Long term (current) use of anticoagulants: Secondary | ICD-10-CM

## 2014-04-08 DIAGNOSIS — Z5181 Encounter for therapeutic drug level monitoring: Secondary | ICD-10-CM | POA: Diagnosis not present

## 2014-04-08 DIAGNOSIS — I4892 Unspecified atrial flutter: Secondary | ICD-10-CM | POA: Diagnosis not present

## 2014-04-08 LAB — POCT INR: INR: 2.4

## 2014-04-11 ENCOUNTER — Other Ambulatory Visit: Payer: Self-pay | Admitting: Internal Medicine

## 2014-04-12 ENCOUNTER — Ambulatory Visit: Payer: Medicare Other | Admitting: Neurology

## 2014-04-22 DIAGNOSIS — Z79899 Other long term (current) drug therapy: Secondary | ICD-10-CM | POA: Diagnosis not present

## 2014-04-22 DIAGNOSIS — D539 Nutritional anemia, unspecified: Secondary | ICD-10-CM | POA: Diagnosis not present

## 2014-04-22 DIAGNOSIS — G40909 Epilepsy, unspecified, not intractable, without status epilepticus: Secondary | ICD-10-CM | POA: Diagnosis not present

## 2014-04-22 DIAGNOSIS — I10 Essential (primary) hypertension: Secondary | ICD-10-CM | POA: Diagnosis not present

## 2014-04-26 DIAGNOSIS — D509 Iron deficiency anemia, unspecified: Secondary | ICD-10-CM | POA: Diagnosis not present

## 2014-04-26 DIAGNOSIS — D539 Nutritional anemia, unspecified: Secondary | ICD-10-CM | POA: Diagnosis not present

## 2014-05-08 ENCOUNTER — Ambulatory Visit (INDEPENDENT_AMBULATORY_CARE_PROVIDER_SITE_OTHER): Payer: Medicare Other | Admitting: *Deleted

## 2014-05-08 DIAGNOSIS — I4892 Unspecified atrial flutter: Secondary | ICD-10-CM | POA: Diagnosis not present

## 2014-05-08 DIAGNOSIS — Z5181 Encounter for therapeutic drug level monitoring: Secondary | ICD-10-CM | POA: Diagnosis not present

## 2014-05-08 DIAGNOSIS — Z7901 Long term (current) use of anticoagulants: Secondary | ICD-10-CM | POA: Diagnosis not present

## 2014-05-08 LAB — POCT INR: INR: 2.4

## 2014-05-13 ENCOUNTER — Telehealth: Payer: Self-pay

## 2014-05-13 DIAGNOSIS — G40909 Epilepsy, unspecified, not intractable, without status epilepticus: Secondary | ICD-10-CM | POA: Diagnosis not present

## 2014-05-13 DIAGNOSIS — D509 Iron deficiency anemia, unspecified: Secondary | ICD-10-CM | POA: Diagnosis not present

## 2014-05-13 DIAGNOSIS — I4892 Unspecified atrial flutter: Secondary | ICD-10-CM | POA: Diagnosis not present

## 2014-05-13 NOTE — Telephone Encounter (Signed)
Dr Carlyle Lipa office called reporting pt's Hgb checked at [facility] and is low at 8.6.  Advised the nurse that INR was 2.4 on 05/08/14 which is therapeutic for pt, so it is not that his blood is too thin because of the Coumadin.  Nurse states she will let Dr Felipa Eth know INR results from last week and address underlying cause of decreased Hgb.

## 2014-05-16 ENCOUNTER — Ambulatory Visit (INDEPENDENT_AMBULATORY_CARE_PROVIDER_SITE_OTHER): Payer: Medicare Other | Admitting: Neurology

## 2014-05-16 ENCOUNTER — Encounter: Payer: Self-pay | Admitting: Neurology

## 2014-05-16 VITALS — BP 142/68 | HR 96 | Resp 16 | Ht 69.0 in | Wt 146.0 lb

## 2014-05-16 DIAGNOSIS — I63132 Cerebral infarction due to embolism of left carotid artery: Secondary | ICD-10-CM

## 2014-05-16 DIAGNOSIS — G40209 Localization-related (focal) (partial) symptomatic epilepsy and epileptic syndromes with complex partial seizures, not intractable, without status epilepticus: Secondary | ICD-10-CM

## 2014-05-16 NOTE — Patient Instructions (Signed)
1. Please write down your medications and how you are currently taking them and bring it to our office 2. Continue all your medications 3. Follow-up in 1 year  Seizure Precautions: 1. If medication has been prescribed for you to prevent seizures, take it exactly as directed.  Do not stop taking the medicine without talking to your doctor first, even if you have not had a seizure in a long time.   2. Avoid activities in which a seizure would cause danger to yourself or to others.  Don't operate dangerous machinery, swim alone, or climb in high or dangerous places, such as on ladders, roofs, or girders.  Do not drive unless your doctor says you may.  3. If you have any warning that you may have a seizure, lay down in a safe place where you can't hurt yourself.    4.  No driving for 6 months from last seizure, as per [facility].   Please refer to the following link on the [place] website for more information: http://[email or web address]/[email or web address]/[email or web address]   5.  Maintain good sleep hygiene.  6.  Contact your doctor if you have any problems that may be related to the medicine you are taking.  7.  Call 911 and bring the patient back to the ED if:        A.  The seizure lasts longer than 5 minutes.       B.  The patient doesn't awaken shortly after the seizure  C.  The patient has new problems such as difficulty seeing, speaking or moving  D.  The patient was injured during the seizure  E.  The patient has a temperature over 102 F (39C)  F.  The patient vomited and now is having trouble breathing

## 2014-05-16 NOTE — Progress Notes (Deleted)
NEUROLOGY FOLLOW UP OFFICE NOTE  ELIHUE Dominguez [id number]  HISTORY OF PRESENT ILLNESS: I had the pleasure of seeing [name] in follow-up in the neurology clinic on 05/16/2014.  The patient was last seen on 7 months ago for seizures after a stroke in 2006. He returns by himself today. He continues to live by himself and denies any difficulties. On his last visit with me, as well as previously with [name], concern about living alone was discussed with the patient and his daughter. The patient reports that  and is accompanied by *** today.  Records and images were personally reviewed where available.  ***. Driving and has not gotten lost Daughter calls everyday, sees her every couple of weeks No probs with meds, denies dizziness, blurred vision, falls, no HAs, no focal sx No diff speaking Cannot remember what he is taking, "it's on the bottle"; was told to take 7 pills instead of 8, but does not know which  Memory has "always been bad"  [name] had a history of seizures after a cerebral infarction in 2006. He had approximately 5 seizures between 2006 and 2009. Once his medications were properly adjusted, he had been seizure-free until an [place] stroke in 2013. He is currently on Keppra 1500 mg twice a day and Dilantin 300 mg daily. 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 His daughter says it takes about 2 days for him to get back to normal. They deny any of those types of seizures, however he continues to report "smaller" ones that come on gradually where he "cannot do anything." He states he can talk during those times and denies any confusion, but "the book is not there." He had been seizure-free for many months until he decided to reduce the evening Keppra dose by 1 tablet, and had 2 seizures recently. Around 2 weeks ago, he reports a longer episode  that was unwitnessed, he sat down and let it pass. The last seizure was 1 week ago, lasting 10 minutes. He denies any injuries with these, no tongue bite, urinary incontinence, no falls. He and his daughter deny any history of convulsive seizures. His daughter has not witnessed these "smaller" seizures. After the last seizure 1 week ago, he went back to Keppra 1500mg  BID and continued Dilantin 300mg  daily.   He denies any olfactory/gustatory hallucinations, focal numbness/tingling/weakness. He lives alone, drives and goes out to eat. He administers his own medication. On review of prior records, he has had significant fluctuations in Dilantin level, concerning for possible medication confusion that he would take additional medication. Last Dilantin level on 01/19/13 was 28.3. He had ranged from 15.1 to 38.1. Concern for memory issues have been discussed with his family in the past by [name], last August 2014 he called two and a half hours after the appointment time and reported that he got lost and did not know how to get to the office. He stated he was in the building but couldn't find our suite number. He had some difficulties in the past with regards to recalling the medications he is taking, however today he brings his medication bottles and tells me how he takes the Keppra, Dilantin, and Coumadin.   He denies any dizziness, diplopia, headaches, dysarthria, dysphagia, neck/back pain, bowel/bladder dysfunction. He denies any falls. He drives and denies getting lost. He does not cook  and goes out to eat.   Diagnostic data: I personally reviewed MRI brain from 12/10/06 which showed encephalomalacia with chronic hemosiderin deposition in the left temporal lobe with insular involvement (hemorrhagic infarct). MRI brain from 06/02/11 showed acute infarct in medial left frontal lobe. No EEGs available for review  PAST MEDICAL HISTORY: Past Medical History  Diagnosis Date  . Seizures   .  Stroke   . Pancreatitis   . Hiatal hernia   . HTN (hypertension)     pt denies h/o HTN though it appears he was started on spironolactone during his hospitalization 3/13  . Typical atrial flutter 3/13    MEDICATIONS: Current Outpatient Prescriptions on File Prior to Visit  Medication Sig Dispense Refill  . levETIRAcetam (KEPPRA) 500 MG tablet Take 1,500 mg by mouth 2 (two) times daily.    Marland Kitchen levETIRAcetam (KEPPRA) 500 MG tablet TAKE 3 TABLETS BY MOUTH TWICE A DAY 180 tablet 6  . phenytoin (DILANTIN) 100 MG ER capsule Take 3 capsules every morning 90 capsule 11  . warfarin (COUMADIN) 2.5 MG tablet TAKE 1 TABLET BY MOUTH AS DIRECTED. 60 tablet 3   No current facility-administered medications on file prior to visit.    ALLERGIES: No Known Allergies  FAMILY HISTORY: Family History  Problem Relation Age of Onset  . Hypertension      SOCIAL HISTORY: History   Social History  . Marital Status: Divorced    Spouse Name: N/A  . Number of Children: N/A  . Years of Education: N/A   Occupational History  . Not on file.   Social History Main Topics  . Smoking status: Former Smoker    Quit date: 07/15/1966  . Smokeless tobacco: Never Used  . Alcohol Use: No     Comment: Quit greater than 5 years ago  . Drug Use: No  . Sexual Activity: Not on file   Other Topics Concern  . Not on file   Social History Narrative   Lives alone.  Retired from [employer]    REVIEW OF SYSTEMS: Constitutional: No fevers, chills, or sweats, no generalized fatigue, change in appetite Eyes: No visual changes, double vision, eye pain Ear, nose and throat: No hearing loss, ear pain, nasal congestion, sore throat Cardiovascular: No chest pain, palpitations Respiratory:  No shortness of breath at rest or with exertion, wheezes GastrointestinaI: No nausea, vomiting, diarrhea, abdominal pain, fecal incontinence Genitourinary:  No dysuria, urinary retention or frequency Musculoskeletal:  No neck  pain, back pain Integumentary: No rash, pruritus, skin lesions Neurological: as above Psychiatric: No depression, insomnia, anxiety Endocrine: No palpitations, fatigue, diaphoresis, mood swings, change in appetite, change in weight, increased thirst Hematologic/Lymphatic:  No anemia, purpura, petechiae. Allergic/Immunologic: no itchy/runny eyes, nasal congestion, recent allergic reactions, rashes  PHYSICAL EXAM: Filed Vitals:   05/16/14 0802  BP: 142/68  Pulse: 96  Resp: 16   General: No acute distress Head:  Normocephalic/atraumatic Neck: supple, no paraspinal tenderness, full range of motion Heart:  Regular rate and rhythm Lungs:  Clear to auscultation bilaterally Back: No paraspinal tenderness Skin/Extremities: No rash, no edema Neurological Exam: alert and oriented to person, place, and time. No aphasia or dysarthria. Fund of knowledge is appropriate.  Recent and remote memory are intact.  Attention and concentration are normal.    Able to name objects and repeat phrases. Cranial nerves: Pupils equal, round, reactive to light.  Fundoscopic exam unremarkable, no papilledema. Extraocular movements intact with no nystagmus. Visual fields full. Facial sensation intact. No facial asymmetry.  Tongue, uvula, palate midline.  Motor: Bulk and tone normal, muscle strength 5/5 throughout with no pronator drift.  Sensation to light touch, temperature and vibration intact.  No extinction to double simultaneous stimulation.  Deep tendon reflexes 2+ throughout, toes downgoing.  Finger to nose testing intact.  Gait narrow-based and steady, able to tandem walk adequately.  Romberg negative.  IMPRESSION: This is a 74 year old right-handed man with a history of atrial flutter on chronic anticoagulation, left insular stroke with subsequent partial seizures that rarely secondarily generalize. He has not had any convulsions since 2009. He continues to report "smaller" seizures where he "cannot do anything"  occurring every few months, last episode was 1 week ago when he self-reduced Keppra. He is now back on Keppra 1500mg  BID and continues on Dilantin 300mg  daily. In the past he had fluctuating Dilantin levels, unclear if due to confusion with medications versus interaction with Coumadin. A trough Dilantin and Keppra level will be checked to establish baseline. We discussed [place] driving laws that one should not drive until 6 months seizure-free. We also discussed his memory issues, suspect mild AD. I had discussed with the patient and his daughter having a family discussion regarding his living situation, I do not recommend he live alone, particularly since he should not drive. His daughter expressed understanding. He will follow-up in 6 months.  Medications: Diagnostic testing: Labs: Imaging: Consults: Return to clinic in *** months.  Thank you for allowing me to participate in *** care.  Please do not hesitate to call for any questions or concerns.  The duration of this appointment visit was *** minutes of face-to-face time with the patient.  Greater than 50% of this time was spent in counseling, explanation of diagnosis, planning of further management, and coordination of care.   Ellouise Newer, [name]   CC: ***

## 2014-05-21 ENCOUNTER — Ambulatory Visit: Payer: Medicare Other | Admitting: Neurology

## 2014-05-23 ENCOUNTER — Encounter: Payer: Self-pay | Admitting: Neurology

## 2014-05-23 DIAGNOSIS — H2513 Age-related nuclear cataract, bilateral: Secondary | ICD-10-CM | POA: Diagnosis not present

## 2014-05-23 DIAGNOSIS — H25013 Cortical age-related cataract, bilateral: Secondary | ICD-10-CM | POA: Diagnosis not present

## 2014-05-23 NOTE — Progress Notes (Signed)
NEUROLOGY FOLLOW UP OFFICE NOTE  [name] [id number]  HISTORY OF PRESENT ILLNESS: I had the pleasure of seeing [name] in follow-up in the neurology clinic on 05/16/2014.  The patient was last seen on 7 months ago for seizures after a stroke in 2006. He returns by himself today. He continues to live by himself and denies any difficulties. On his last visit with me, as well as previously with [name], concern about living alone was discussed with the patient and his daughter. The patient denies any difficulties, he denies any seizures. He denies any headaches, dizziness, vision changes, focal numbness/tingling/weakness. He denies any speech difficulties, no falls. He continues to drive and denies getting lost. He reports that his daughter calls him daily and visits every couple of weeks. He denies any problems with his medications, but cannot recall the names of the medications. He tells me that he was instructed to take "7 pills instead of 8" but does not know which one. According to most recent PCP note, he is taking Keppra 500mg  3 tabs in AM, 2 tabs in PM and Dilantin 100mg  3 caps in AM. His last INR on 2/17 was 2.4.   Laboratory Data: 09/2013 Dilantin level 22.4. Keppra level 28.5  HPI: This is a 74 yo RH man with a history of seizures after a cerebral infarction in 2006. He had approximately 5 seizures between 2006 and 2009. Once his medications were properly adjusted, he had been seizure-free until an [place] stroke in 2013. He is currently on Keppra 1500 mg twice a day and Dilantin 300 mg daily. 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 His daughter says it takes about 2 days for him to get back to normal. They deny any of those types of seizures, however he continues to report "smaller" ones that come on gradually where he "cannot do anything." He  states he can talk during those times and denies any confusion, but "the book is not there." He had been seizure-free for many months until he decided to reduce the evening Keppra dose by 1 tablet, and had 2 seizures.  He denies any olfactory/gustatory hallucinations, focal numbness/tingling/weakness. He lives alone, drives and goes out to eat. He administers his own medication. On review of prior records, he has had significant fluctuations in Dilantin level, concerning for possible medication confusion that he would take additional medication. Dilantin level on 01/19/13 was 28.3. He had ranged from 15.1 to 38.1. Concern for memory issues have been discussed with his family in the past by [name], last August 2014 he called two and a half hours after the appointment time and reported that he got lost and did not know how to get to the office. He stated he was in the building but couldn't find our suite number.   Diagnostic data: I personally reviewed MRI brain from 12/10/06 which showed encephalomalacia with chronic hemosiderin deposition in the left temporal lobe with insular involvement (hemorrhagic infarct). MRI brain from 06/02/11 showed acute infarct in medial left frontal lobe. No EEGs available for review  PAST MEDICAL HISTORY: Past Medical History  Diagnosis Date  . Seizures   . Stroke   . Pancreatitis   . Hiatal hernia   . HTN (hypertension)     pt denies h/o HTN though it appears he was started on spironolactone during his hospitalization 3/13  .  Typical atrial flutter 3/13    MEDICATIONS: Current Outpatient Prescriptions on File Prior to Visit  Medication Sig Dispense Refill  . levETIRAcetam (KEPPRA) 500 MG tablet Take 1,500 mg by mouth 2 (two) times daily.    Marland Kitchen levETIRAcetam (KEPPRA) 500 MG tablet TAKE 3 TABLETS BY MOUTH TWICE A DAY 180 tablet 6  . phenytoin (DILANTIN) 100 MG ER capsule Take 3 capsules every morning 90 capsule 11  . warfarin (COUMADIN) 2.5 MG tablet TAKE 1  TABLET BY MOUTH AS DIRECTED. 60 tablet 3   No current facility-administered medications on file prior to visit.    ALLERGIES: No Known Allergies  FAMILY HISTORY: Family History  Problem Relation Age of Onset  . Hypertension      SOCIAL HISTORY: History   Social History  . Marital Status: Divorced    Spouse Name: N/A  . Number of Children: N/A  . Years of Education: N/A   Occupational History  . Not on file.   Social History Main Topics  . Smoking status: Former Smoker    Quit date: 07/15/1966  . Smokeless tobacco: Never Used  . Alcohol Use: No     Comment: Quit greater than 5 years ago  . Drug Use: No  . Sexual Activity: Not on file   Other Topics Concern  . Not on file   Social History Narrative   Lives alone.  Retired from [employer]    REVIEW OF SYSTEMS: Constitutional: No fevers, chills, or sweats, no generalized fatigue, change in appetite Eyes: No visual changes, double vision, eye pain Ear, nose and throat: No hearing loss, ear pain, nasal congestion, sore throat Cardiovascular: No chest pain, palpitations Respiratory:  No shortness of breath at rest or with exertion, wheezes GastrointestinaI: No nausea, vomiting, diarrhea, abdominal pain, fecal incontinence Genitourinary:  No dysuria, urinary retention or frequency Musculoskeletal:  No neck pain, back pain Integumentary: No rash, pruritus, skin lesions Neurological: as above Psychiatric: No depression, insomnia, anxiety Endocrine: No palpitations, fatigue, diaphoresis, mood swings, change in appetite, change in weight, increased thirst Hematologic/Lymphatic:  No anemia, purpura, petechiae. Allergic/Immunologic: no itchy/runny eyes, nasal congestion, recent allergic reactions, rashes  PHYSICAL EXAM: Filed Vitals:   05/16/14 0802  BP: 142/68  Pulse: 96  Resp: 16   General: No acute distress Head:  Normocephalic/atraumatic Neck: supple, no paraspinal tenderness, full range of motion Heart:   Regular rate and rhythm Lungs:  Clear to auscultation bilaterally Back: No paraspinal tenderness Skin/Extremities: No rash, no edema Neurological Exam: alert and oriented to person, place, month/year. No aphasia or dysarthria. Fund of knowledge is appropriate.  Remote memory intact.  Attention and concentration are normal. Able to name objects and repeat phrases. Good clock drawing. MMSE - Mini Mental State Exam 05/16/2014 10/09/2013 11/16/2012  Orientation to time 3 5 3   Orientation to Place 5 5 5   Registration 3 3 3   Attention/ Calculation 3 1 0  Recall 1 1 0  Language- name 2 objects 2 2 2   Language- repeat 1 1 0  Language- follow 3 step command 3 3 3   Language- read & follow direction 1 1 1   Write a sentence 1 1 1   Copy design 1 1 1   Total score 24 24 19    Cranial nerves: Pupils equal, round, reactive to light.  Fundoscopic exam unremarkable, no papilledema. Extraocular movements intact with no nystagmus. Visual fields full. Facial sensation intact. No facial asymmetry. Tongue, uvula, palate midline.  Motor: Bulk and tone normal, muscle strength 5/5 throughout with  no pronator drift.  Sensation to light touch intact.  No extinction to double simultaneous stimulation.  Deep tendon reflexes 2+ throughout, toes downgoing.  Finger to nose testing intact.  Gait narrow-based and steady, able to tandem walk adequately.  Romberg negative.  IMPRESSION: This is a 74 yo RH man with a history of atrial flutter on chronic anticoagulation, left insular stroke with subsequent partial seizures that rarely secondarily generalize. He has not had any convulsions since 2009. He denies any seizures since his visit 7 months ago. He states he was instructed by his PCP to reduce Keppra, he is currently on 1500mg  in AM, 1000mg  in PM and continues on Dilantin 300mg  daily. He denies any problems with memory and continues to live alone. MMSE today is 24/30,similar to prior visit. He likely has mild AD and will need  close monitoring. This was discussed with his daughter on the last visit, he returns alone today. We will contact her and again discuss concerns. We discussed [place] driving laws and he knows to stop driving after a seizure until 6 months seizure-free. He will call our office to let us know how he is exactly taking his medications. He will follow-up in 1 year or earlier if needed.  Thank you for allowing me to participate in his care.  Please do not hesitate to call for any questions or concerns.  The duration of this appointment visit was 25 minutes of face-to-face time with the patient.  Greater than 50% of this time was spent in counseling, explanation of diagnosis, planning of further management, and coordination of care.   Ellouise Newer, [name]   CC: Dr. Felipa Eth

## 2014-06-05 ENCOUNTER — Ambulatory Visit (INDEPENDENT_AMBULATORY_CARE_PROVIDER_SITE_OTHER): Payer: Medicare Other | Admitting: *Deleted

## 2014-06-05 DIAGNOSIS — Z5181 Encounter for therapeutic drug level monitoring: Secondary | ICD-10-CM | POA: Diagnosis not present

## 2014-06-05 DIAGNOSIS — Z7901 Long term (current) use of anticoagulants: Secondary | ICD-10-CM

## 2014-06-05 DIAGNOSIS — I4892 Unspecified atrial flutter: Secondary | ICD-10-CM

## 2014-06-05 LAB — POCT INR: INR: 2

## 2014-06-13 DIAGNOSIS — D509 Iron deficiency anemia, unspecified: Secondary | ICD-10-CM | POA: Diagnosis not present

## 2014-06-28 ENCOUNTER — Telehealth: Payer: Self-pay | Admitting: Internal Medicine

## 2014-06-28 NOTE — Telephone Encounter (Signed)
Dr. Kathline Magic office calling re procedures patient having in May and needs to go off Coumadin, Dr. Rayann Heman faxed back that he doesn't want pt to go off the Coumadin, but pt is anemic and Dr. Michail Sermon can't  do these procedures while he is on Coumadin , pls call to see if anything can be done

## 2014-07-02 NOTE — Telephone Encounter (Signed)
Discussed with Dr Rayann Heman.  Given prior stroke, will require Lovenox bridge if GI can not perform on Coumadin.

## 2014-07-03 ENCOUNTER — Ambulatory Visit (INDEPENDENT_AMBULATORY_CARE_PROVIDER_SITE_OTHER): Payer: Medicare Other | Admitting: *Deleted

## 2014-07-03 DIAGNOSIS — I4892 Unspecified atrial flutter: Secondary | ICD-10-CM

## 2014-07-03 DIAGNOSIS — Z5181 Encounter for therapeutic drug level monitoring: Secondary | ICD-10-CM

## 2014-07-03 DIAGNOSIS — Z7901 Long term (current) use of anticoagulants: Secondary | ICD-10-CM | POA: Diagnosis not present

## 2014-07-03 LAB — POCT INR: INR: 2.2

## 2014-07-04 ENCOUNTER — Telehealth: Payer: Self-pay | Admitting: Internal Medicine

## 2014-07-04 NOTE — Telephone Encounter (Signed)
Spoke with Melissa with [facility].  Procedure is May 23rd.  We will take care of Lovenox bridge for patient.  His next INR appt is 5/25.  This will need to be changed.  LMOM for pt to reschedule and set up Lovenox bridge.

## 2014-07-04 NOTE — Telephone Encounter (Signed)
New message        Lovenox bridge before procedure   Office wants to know if Dr Rayann Heman would be handling this

## 2014-07-05 NOTE — Telephone Encounter (Signed)
Called and spoke with pt and made appt for him to be seen in coumadin clinic for bridging instruction on May 16th as he is scheduled for Endoscopy and Colonoscopy on May 23rd  at [place]  and pt states understanding.

## 2014-08-06 ENCOUNTER — Ambulatory Visit (INDEPENDENT_AMBULATORY_CARE_PROVIDER_SITE_OTHER): Payer: Medicare Other | Admitting: *Deleted

## 2014-08-06 DIAGNOSIS — Z7901 Long term (current) use of anticoagulants: Secondary | ICD-10-CM | POA: Diagnosis not present

## 2014-08-06 DIAGNOSIS — I4892 Unspecified atrial flutter: Secondary | ICD-10-CM | POA: Diagnosis not present

## 2014-08-06 DIAGNOSIS — Z5181 Encounter for therapeutic drug level monitoring: Secondary | ICD-10-CM

## 2014-08-06 LAB — POCT INR: INR: 2.1

## 2014-08-06 NOTE — Patient Instructions (Addendum)
08/06/2014 last day to take coumadin  08/07/2014 no coumadin no Lovenox 08/08/2014 no  Coumadin Lovenox 100 mg  At 8am Give in abd fat in stomach rotate sites 08/09/2014 no coumadin Lovenox 100 mg at 8am Give in abd fat in stomach rotate sites 08/10/2014 no coumadin Lovenox 100mg  at 8am give in abd fat in stomach rotate sites 08/11/2014 no coumadin  Lovenox 100 mg at 8am give in abd fat  Rotate sites 08/12/2014 No coumadin No Lovenox day of procedure  When instructed by MD doing procedure restart coumadin and lovenox at same dose  Lovenox 100 mg 8am each morning and take coumadin 5mg  ( 2 tablets) every day except 3.75mg  ( 1 and 1/2 tablets) on Sunday Tuesday and Friday and for 2 days after restarting coumadin take extra 1/2 tablet  Continue coumadin and Lovenox until rechecked in coumadin clinic on May 27th 08/07/2014 Spoke with pt's daughter [name] and she states she is cancelling the procedures at this time until she is able to be with the pt to help with prep pre and post procedures

## 2014-09-03 ENCOUNTER — Ambulatory Visit (INDEPENDENT_AMBULATORY_CARE_PROVIDER_SITE_OTHER): Payer: Medicare Other | Admitting: *Deleted

## 2014-09-03 DIAGNOSIS — Z7901 Long term (current) use of anticoagulants: Secondary | ICD-10-CM

## 2014-09-03 DIAGNOSIS — I4892 Unspecified atrial flutter: Secondary | ICD-10-CM

## 2014-09-03 DIAGNOSIS — Z5181 Encounter for therapeutic drug level monitoring: Secondary | ICD-10-CM | POA: Diagnosis not present

## 2014-09-03 LAB — POCT INR: INR: 2.8

## 2014-09-05 ENCOUNTER — Other Ambulatory Visit: Payer: Self-pay | Admitting: Internal Medicine

## 2014-10-01 ENCOUNTER — Ambulatory Visit (INDEPENDENT_AMBULATORY_CARE_PROVIDER_SITE_OTHER): Payer: Medicare Other

## 2014-10-01 DIAGNOSIS — I4892 Unspecified atrial flutter: Secondary | ICD-10-CM | POA: Diagnosis not present

## 2014-10-01 DIAGNOSIS — Z5181 Encounter for therapeutic drug level monitoring: Secondary | ICD-10-CM | POA: Diagnosis not present

## 2014-10-01 DIAGNOSIS — Z7901 Long term (current) use of anticoagulants: Secondary | ICD-10-CM | POA: Diagnosis not present

## 2014-10-01 LAB — POCT INR: INR: 2.3

## 2014-10-04 DIAGNOSIS — Z79899 Other long term (current) drug therapy: Secondary | ICD-10-CM | POA: Diagnosis not present

## 2014-10-04 DIAGNOSIS — G40909 Epilepsy, unspecified, not intractable, without status epilepticus: Secondary | ICD-10-CM | POA: Diagnosis not present

## 2014-10-04 DIAGNOSIS — I10 Essential (primary) hypertension: Secondary | ICD-10-CM | POA: Diagnosis not present

## 2014-10-14 ENCOUNTER — Other Ambulatory Visit: Payer: Self-pay | Admitting: Neurology

## 2014-10-25 DIAGNOSIS — D509 Iron deficiency anemia, unspecified: Secondary | ICD-10-CM | POA: Diagnosis not present

## 2014-10-25 DIAGNOSIS — I10 Essential (primary) hypertension: Secondary | ICD-10-CM | POA: Diagnosis not present

## 2014-10-29 ENCOUNTER — Ambulatory Visit (INDEPENDENT_AMBULATORY_CARE_PROVIDER_SITE_OTHER): Payer: Medicare Other | Admitting: *Deleted

## 2014-10-29 DIAGNOSIS — Z7901 Long term (current) use of anticoagulants: Secondary | ICD-10-CM | POA: Diagnosis not present

## 2014-10-29 DIAGNOSIS — Z5181 Encounter for therapeutic drug level monitoring: Secondary | ICD-10-CM | POA: Diagnosis not present

## 2014-10-29 DIAGNOSIS — I4892 Unspecified atrial flutter: Secondary | ICD-10-CM

## 2014-10-29 LAB — POCT INR: INR: 2.7

## 2014-11-11 DIAGNOSIS — G40909 Epilepsy, unspecified, not intractable, without status epilepticus: Secondary | ICD-10-CM | POA: Diagnosis not present

## 2014-11-15 ENCOUNTER — Other Ambulatory Visit: Payer: Self-pay | Admitting: Neurology

## 2014-11-26 DIAGNOSIS — D509 Iron deficiency anemia, unspecified: Secondary | ICD-10-CM | POA: Diagnosis not present

## 2014-12-10 ENCOUNTER — Ambulatory Visit (INDEPENDENT_AMBULATORY_CARE_PROVIDER_SITE_OTHER): Payer: Medicare Other | Admitting: Pharmacist Clinician (PhC)/ Clinical Pharmacy Specialist

## 2014-12-10 DIAGNOSIS — I4892 Unspecified atrial flutter: Secondary | ICD-10-CM | POA: Diagnosis not present

## 2014-12-10 DIAGNOSIS — Z7901 Long term (current) use of anticoagulants: Secondary | ICD-10-CM | POA: Diagnosis not present

## 2014-12-10 DIAGNOSIS — Z5181 Encounter for therapeutic drug level monitoring: Secondary | ICD-10-CM | POA: Diagnosis not present

## 2014-12-10 LAB — POCT INR: INR: 2

## 2014-12-10 MED ORDER — ENOXAPARIN SODIUM 60 MG/0.6ML ~~LOC~~ SOLN: SUBCUTANEOUS | Status: DC

## 2014-12-10 NOTE — Progress Notes (Signed)
Spoke with daughter [name] - explained the need for lovenox bridging.  Explained that patient was given written information on when to do injections and when to take/hold warfarin.  She will review this and call if any questions/concerns.

## 2014-12-10 NOTE — Patient Instructions (Signed)
Enoxaprin Dosing Schedule  Enoxparin dose: 60 mg  Date  Warfarin Dose (evenings) Enoxaprin Dose  10-6 6 5  mg (2 tabs)   10-7 5 0   10-8 4 0 8 am    8 pm  10-9 3 0 8 am    8 pm  10-10 2 0 8 am    8 pm  10-11 1 0 8 am  10-12 Procedure 5 mg (2 tabs)  no enoxaparin injections  10-13 1 7.5 mg (3 tabs) 8 am    8 pm  10-14 2 7.5 mg (3 tabs) 8 am    8 pm  10-15 3 7.5 mg (3 tabs) 8 am    8 pm  10-16 4 5  mg (2 tabs) 8 am    8 pm  10-17 5 Check INR 8 am  10-18 6

## 2015-01-01 DIAGNOSIS — K640 First degree hemorrhoids: Secondary | ICD-10-CM | POA: Diagnosis not present

## 2015-01-01 DIAGNOSIS — K5730 Diverticulosis of large intestine without perforation or abscess without bleeding: Secondary | ICD-10-CM | POA: Diagnosis not present

## 2015-01-01 DIAGNOSIS — D509 Iron deficiency anemia, unspecified: Secondary | ICD-10-CM | POA: Diagnosis not present

## 2015-01-01 DIAGNOSIS — K449 Diaphragmatic hernia without obstruction or gangrene: Secondary | ICD-10-CM | POA: Diagnosis not present

## 2015-01-06 ENCOUNTER — Ambulatory Visit (INDEPENDENT_AMBULATORY_CARE_PROVIDER_SITE_OTHER): Payer: Medicare Other | Admitting: *Deleted

## 2015-01-06 DIAGNOSIS — I4892 Unspecified atrial flutter: Secondary | ICD-10-CM

## 2015-01-06 DIAGNOSIS — Z5181 Encounter for therapeutic drug level monitoring: Secondary | ICD-10-CM

## 2015-01-06 DIAGNOSIS — Z7901 Long term (current) use of anticoagulants: Secondary | ICD-10-CM | POA: Diagnosis not present

## 2015-01-06 LAB — POCT INR: INR: 1.2

## 2015-01-10 ENCOUNTER — Ambulatory Visit (INDEPENDENT_AMBULATORY_CARE_PROVIDER_SITE_OTHER): Payer: Medicare Other | Admitting: *Deleted

## 2015-01-10 DIAGNOSIS — Z5181 Encounter for therapeutic drug level monitoring: Secondary | ICD-10-CM

## 2015-01-10 DIAGNOSIS — I4892 Unspecified atrial flutter: Secondary | ICD-10-CM

## 2015-01-10 DIAGNOSIS — Z7901 Long term (current) use of anticoagulants: Secondary | ICD-10-CM | POA: Diagnosis not present

## 2015-01-10 LAB — POCT INR: INR: 1.5

## 2015-01-24 ENCOUNTER — Ambulatory Visit (INDEPENDENT_AMBULATORY_CARE_PROVIDER_SITE_OTHER): Payer: Medicare Other | Admitting: *Deleted

## 2015-01-24 DIAGNOSIS — I4892 Unspecified atrial flutter: Secondary | ICD-10-CM | POA: Diagnosis not present

## 2015-01-24 DIAGNOSIS — Z5181 Encounter for therapeutic drug level monitoring: Secondary | ICD-10-CM

## 2015-01-24 DIAGNOSIS — Z7901 Long term (current) use of anticoagulants: Secondary | ICD-10-CM | POA: Diagnosis not present

## 2015-01-24 LAB — POCT INR: INR: 1.7

## 2015-01-29 DIAGNOSIS — D509 Iron deficiency anemia, unspecified: Secondary | ICD-10-CM | POA: Diagnosis not present

## 2015-02-03 ENCOUNTER — Ambulatory Visit (INDEPENDENT_AMBULATORY_CARE_PROVIDER_SITE_OTHER): Payer: Medicare Other | Admitting: Pharmacist

## 2015-02-03 DIAGNOSIS — Z5181 Encounter for therapeutic drug level monitoring: Secondary | ICD-10-CM

## 2015-02-03 DIAGNOSIS — Z7901 Long term (current) use of anticoagulants: Secondary | ICD-10-CM | POA: Diagnosis not present

## 2015-02-03 DIAGNOSIS — I4892 Unspecified atrial flutter: Secondary | ICD-10-CM | POA: Diagnosis not present

## 2015-02-03 LAB — POCT INR: INR: 1.7

## 2015-02-07 ENCOUNTER — Other Ambulatory Visit: Payer: Self-pay | Admitting: Internal Medicine

## 2015-02-17 ENCOUNTER — Ambulatory Visit (INDEPENDENT_AMBULATORY_CARE_PROVIDER_SITE_OTHER): Payer: Medicare Other

## 2015-02-17 DIAGNOSIS — Z5181 Encounter for therapeutic drug level monitoring: Secondary | ICD-10-CM | POA: Diagnosis not present

## 2015-02-17 DIAGNOSIS — Z7901 Long term (current) use of anticoagulants: Secondary | ICD-10-CM | POA: Diagnosis not present

## 2015-02-17 DIAGNOSIS — I4892 Unspecified atrial flutter: Secondary | ICD-10-CM | POA: Diagnosis not present

## 2015-02-17 LAB — POCT INR: INR: 2.1

## 2015-03-10 ENCOUNTER — Ambulatory Visit (INDEPENDENT_AMBULATORY_CARE_PROVIDER_SITE_OTHER): Payer: Medicare Other | Admitting: *Deleted

## 2015-03-10 DIAGNOSIS — Z7901 Long term (current) use of anticoagulants: Secondary | ICD-10-CM

## 2015-03-10 DIAGNOSIS — Z5181 Encounter for therapeutic drug level monitoring: Secondary | ICD-10-CM

## 2015-03-10 DIAGNOSIS — I4892 Unspecified atrial flutter: Secondary | ICD-10-CM

## 2015-03-10 LAB — POCT INR: INR: 1.8

## 2015-04-01 ENCOUNTER — Ambulatory Visit
Admission: RE | Admit: 2015-04-01 | Discharge: 2015-04-01 | Disposition: A | Discharge status: Home / Self Care | Payer: Medicare Other | Source: Ambulatory Visit | Attending: Internal Medicine | Admitting: Internal Medicine

## 2015-04-01 ENCOUNTER — Other Ambulatory Visit: Payer: Self-pay | Admitting: Internal Medicine

## 2015-04-01 DIAGNOSIS — M25531 Pain in right wrist: Secondary | ICD-10-CM

## 2015-04-01 DIAGNOSIS — M79641 Pain in right hand: Secondary | ICD-10-CM | POA: Diagnosis not present

## 2015-04-09 ENCOUNTER — Encounter: Payer: Self-pay | Admitting: Internal Medicine

## 2015-04-09 ENCOUNTER — Ambulatory Visit (INDEPENDENT_AMBULATORY_CARE_PROVIDER_SITE_OTHER): Payer: Medicare Other | Admitting: Pharmacist

## 2015-04-09 ENCOUNTER — Ambulatory Visit (INDEPENDENT_AMBULATORY_CARE_PROVIDER_SITE_OTHER): Payer: Medicare Other | Admitting: Internal Medicine

## 2015-04-09 VITALS — BP 140/82 | HR 95 | Ht 69.0 in | Wt 150.8 lb

## 2015-04-09 DIAGNOSIS — Z7901 Long term (current) use of anticoagulants: Secondary | ICD-10-CM

## 2015-04-09 DIAGNOSIS — I483 Typical atrial flutter: Secondary | ICD-10-CM

## 2015-04-09 DIAGNOSIS — Z5181 Encounter for therapeutic drug level monitoring: Secondary | ICD-10-CM

## 2015-04-09 DIAGNOSIS — I4892 Unspecified atrial flutter: Secondary | ICD-10-CM | POA: Diagnosis not present

## 2015-04-09 LAB — POCT INR: INR: 2.6

## 2015-04-09 NOTE — Progress Notes (Signed)
PCP:  [name], MD  The patient presents today for routine cardiology followup.  He is doing well.  He is unaware of any recent atrial arrhythmias.  He continues to take coumadin.  Today, he denies symptoms of  chest pain, shortness of breath,  lower extremity edema (above baseline) dizziness, presyncope, syncope, or further neurologic sequela.   He tripped and fell 2 weeks ago and injured his R wrist.  He is being followed by primary care and says that he has had Xrays.   I have encouraged him to follow-up with primary care.  The patient feels that he is tolerating medications without difficulties and is otherwise without complaint today.   Past Medical History  Diagnosis Date  . Seizures ([place])   . Stroke ([place])   . Pancreatitis   . Hiatal hernia   . HTN (hypertension)     pt denies h/o HTN though it appears he was started on spironolactone during his hospitalization 3/13  . Typical atrial flutter ([place]) 3/13   Past Surgical History  Procedure Laterality Date  . No surgical history  06/2011    Current Outpatient Prescriptions  Medication Sig Dispense Refill  . ferrous sulfate 325 (65 FE) MG tablet Take 325 mg by mouth daily.    Marland Kitchen levETIRAcetam (KEPPRA) 500 MG tablet TAKE 3 TABLETS BY MOUTH TWICE A DAY 180 tablet 6  . phenytoin (DILANTIN) 100 MG ER capsule Take 300 mg by mouth 2 (two) times daily.     . sildenafil (VIAGRA) 50 MG tablet Take 50 mg by mouth daily as needed for erectile dysfunction.    Marland Kitchen warfarin (COUMADIN) 2.5 MG tablet TAKE 1 TABLET BY MOUTH AS DIRECTED. 60 tablet 3   No current facility-administered medications for this visit.    No Known Allergies  Social History   Social History  . Marital Status: Divorced    Spouse Name: N/A  . Number of Children: N/A  . Years of Education: N/A   Occupational History  . Not on file.   Social History Main Topics  . Smoking status: Former Smoker    Quit date: 07/15/1966  . Smokeless tobacco: Never Used  .  Alcohol Use: No     Comment: Quit greater than 5 years ago  . Drug Use: No  . Sexual Activity: Not on file   Other Topics Concern  . Not on file   Social History Narrative   Lives alone.  Retired from [employer]    Family History  Problem Relation Age of Onset  . Hypertension      Physical Exam: Filed Vitals:   04/09/15 1115  BP: 140/82  Pulse: 95  Height: 5\' 9"  (1.753 m)  Weight: 150 lb 12.8 oz (68.402 kg)    GEN- The patient is well appearing, alert and oriented x 3 today.   Head- normocephalic, atraumatic Eyes-  Sclera clear, conjunctiva pink Ears- hearing intact Oropharynx- clear Lungs- Clear to ausculation bilaterally, normal work of breathing Heart- Regular rate and rhythm, no murmurs, rubs or gallops, PMI not laterally displaced GI- soft, NT, ND, + BS Extremities- no clubbing, cyanosis, trace ankle edema, R wrist guarding with diffuse swelling of the hand  ekg today reveals sinus rhythm 95 bpm, LAA,  PR 202 msec,  incomplete RBBB, otherwise normal ekg  Assessment and Plan:  1. Atrial flutter Well controlled He has previously declined ablation and would prefer to continue coumadin at this time.  He has left atrial enlargement by ekg and I  would suspect may be at risk of having afib in the future.  I would therefore continue along current course of anticoagulation and rate control going forward unless symptomatic arrhythmias arise. Continue coumadin  Return in 1 year to see EP NP  [name] MD, [facility] 04/09/2015 11:32 AM

## 2015-04-09 NOTE — Patient Instructions (Signed)
Medication Instructions:  Your physician recommends that you continue on your current medications as directed. Please refer to the Current Medication list given to you today.   Labwork: None ordered   Testing/Procedures: None ordered   Follow-Up: Your physician wants you to follow-up in: 12 months with Renee Ursuy, PA You will receive a reminder letter in the mail two months in advance. If you don't receive a letter, please call our office to schedule the follow-up appointment.       Any Other Special Instructions Will Be Listed Below (If Applicable).     If you need a refill on your cardiac medications before your next appointment, please call your pharmacy.   

## 2015-04-11 DIAGNOSIS — K760 Fatty (change of) liver, not elsewhere classified: Secondary | ICD-10-CM | POA: Diagnosis not present

## 2015-04-11 DIAGNOSIS — Z0000 Encounter for general adult medical examination without abnormal findings: Secondary | ICD-10-CM | POA: Diagnosis not present

## 2015-04-11 DIAGNOSIS — Z1389 Encounter for screening for other disorder: Secondary | ICD-10-CM | POA: Diagnosis not present

## 2015-04-11 DIAGNOSIS — I10 Essential (primary) hypertension: Secondary | ICD-10-CM | POA: Diagnosis not present

## 2015-04-11 DIAGNOSIS — Z79899 Other long term (current) drug therapy: Secondary | ICD-10-CM | POA: Diagnosis not present

## 2015-04-11 DIAGNOSIS — I6930 Unspecified sequelae of cerebral infarction: Secondary | ICD-10-CM | POA: Diagnosis not present

## 2015-04-11 DIAGNOSIS — Z7189 Other specified counseling: Secondary | ICD-10-CM | POA: Diagnosis not present

## 2015-04-11 DIAGNOSIS — G40909 Epilepsy, unspecified, not intractable, without status epilepticus: Secondary | ICD-10-CM | POA: Diagnosis not present

## 2015-04-18 DIAGNOSIS — R52 Pain, unspecified: Secondary | ICD-10-CM | POA: Diagnosis not present

## 2015-04-24 ENCOUNTER — Ambulatory Visit: Payer: Medicare Other | Admitting: Neurology

## 2015-05-02 DIAGNOSIS — S52531D Colles' fracture of right radius, subsequent encounter for closed fracture with routine healing: Secondary | ICD-10-CM | POA: Diagnosis not present

## 2015-05-02 DIAGNOSIS — S52501D Unspecified fracture of the lower end of right radius, subsequent encounter for closed fracture with routine healing: Secondary | ICD-10-CM | POA: Diagnosis not present

## 2015-05-02 DIAGNOSIS — R52 Pain, unspecified: Secondary | ICD-10-CM | POA: Diagnosis not present

## 2015-05-07 ENCOUNTER — Ambulatory Visit (INDEPENDENT_AMBULATORY_CARE_PROVIDER_SITE_OTHER): Payer: Medicare Other | Admitting: Pharmacist

## 2015-05-07 DIAGNOSIS — I483 Typical atrial flutter: Secondary | ICD-10-CM | POA: Diagnosis not present

## 2015-05-07 DIAGNOSIS — Z5181 Encounter for therapeutic drug level monitoring: Secondary | ICD-10-CM | POA: Diagnosis not present

## 2015-05-07 DIAGNOSIS — I4892 Unspecified atrial flutter: Secondary | ICD-10-CM

## 2015-05-07 DIAGNOSIS — Z7901 Long term (current) use of anticoagulants: Secondary | ICD-10-CM | POA: Diagnosis not present

## 2015-05-07 LAB — POCT INR: INR: 2.1

## 2015-05-26 DIAGNOSIS — Z8781 Personal history of (healed) traumatic fracture: Secondary | ICD-10-CM | POA: Diagnosis not present

## 2015-05-26 DIAGNOSIS — S52531D Colles' fracture of right radius, subsequent encounter for closed fracture with routine healing: Secondary | ICD-10-CM | POA: Diagnosis not present

## 2015-05-29 DIAGNOSIS — M25531 Pain in right wrist: Secondary | ICD-10-CM | POA: Diagnosis not present

## 2015-05-29 DIAGNOSIS — S52531D Colles' fracture of right radius, subsequent encounter for closed fracture with routine healing: Secondary | ICD-10-CM | POA: Diagnosis not present

## 2015-05-29 DIAGNOSIS — M6281 Muscle weakness (generalized): Secondary | ICD-10-CM | POA: Diagnosis not present

## 2015-05-29 DIAGNOSIS — M25631 Stiffness of right wrist, not elsewhere classified: Secondary | ICD-10-CM | POA: Diagnosis not present

## 2015-06-04 ENCOUNTER — Ambulatory Visit (INDEPENDENT_AMBULATORY_CARE_PROVIDER_SITE_OTHER): Payer: Medicare Other | Admitting: Pharmacist

## 2015-06-04 DIAGNOSIS — I4892 Unspecified atrial flutter: Secondary | ICD-10-CM | POA: Diagnosis not present

## 2015-06-04 DIAGNOSIS — Z5181 Encounter for therapeutic drug level monitoring: Secondary | ICD-10-CM

## 2015-06-04 DIAGNOSIS — Z7901 Long term (current) use of anticoagulants: Secondary | ICD-10-CM | POA: Diagnosis not present

## 2015-06-04 DIAGNOSIS — I483 Typical atrial flutter: Secondary | ICD-10-CM | POA: Diagnosis not present

## 2015-06-04 LAB — POCT INR: INR: 1.9

## 2015-06-05 DIAGNOSIS — M25631 Stiffness of right wrist, not elsewhere classified: Secondary | ICD-10-CM | POA: Diagnosis not present

## 2015-06-05 DIAGNOSIS — M25531 Pain in right wrist: Secondary | ICD-10-CM | POA: Diagnosis not present

## 2015-06-05 DIAGNOSIS — S52531D Colles' fracture of right radius, subsequent encounter for closed fracture with routine healing: Secondary | ICD-10-CM | POA: Diagnosis not present

## 2015-06-05 DIAGNOSIS — M6281 Muscle weakness (generalized): Secondary | ICD-10-CM | POA: Diagnosis not present

## 2015-06-12 DIAGNOSIS — M25531 Pain in right wrist: Secondary | ICD-10-CM | POA: Diagnosis not present

## 2015-06-12 DIAGNOSIS — M6281 Muscle weakness (generalized): Secondary | ICD-10-CM | POA: Diagnosis not present

## 2015-06-12 DIAGNOSIS — M25631 Stiffness of right wrist, not elsewhere classified: Secondary | ICD-10-CM | POA: Diagnosis not present

## 2015-06-12 DIAGNOSIS — S52531D Colles' fracture of right radius, subsequent encounter for closed fracture with routine healing: Secondary | ICD-10-CM | POA: Diagnosis not present

## 2015-06-16 DIAGNOSIS — S52501D Unspecified fracture of the lower end of right radius, subsequent encounter for closed fracture with routine healing: Secondary | ICD-10-CM | POA: Diagnosis not present

## 2015-06-17 ENCOUNTER — Telehealth: Payer: Self-pay | Admitting: Neurology

## 2015-06-17 NOTE — Telephone Encounter (Signed)
Patient no-showed for 04/24/15 visit. Records from his PCP were reviewed. Pt seen at PCP 04/11/15 denying any symptoms. Bloodwork done showed unremarkable CBC, CMP, Keppra level 50 (ref 10-40). Dilantin level was done but results unavailable.

## 2015-06-20 ENCOUNTER — Other Ambulatory Visit: Payer: Self-pay | Admitting: Internal Medicine

## 2015-06-27 ENCOUNTER — Other Ambulatory Visit: Payer: Self-pay | Admitting: Neurology

## 2015-07-02 ENCOUNTER — Ambulatory Visit (INDEPENDENT_AMBULATORY_CARE_PROVIDER_SITE_OTHER): Payer: Medicare Other | Admitting: *Deleted

## 2015-07-02 DIAGNOSIS — I483 Typical atrial flutter: Secondary | ICD-10-CM

## 2015-07-02 DIAGNOSIS — I4892 Unspecified atrial flutter: Secondary | ICD-10-CM

## 2015-07-02 DIAGNOSIS — Z7901 Long term (current) use of anticoagulants: Secondary | ICD-10-CM

## 2015-07-02 DIAGNOSIS — Z5181 Encounter for therapeutic drug level monitoring: Secondary | ICD-10-CM

## 2015-07-02 LAB — POCT INR: INR: 1.6

## 2015-07-03 ENCOUNTER — Telehealth: Payer: Self-pay | Admitting: Neurology

## 2015-07-03 MED ORDER — LEVETIRACETAM 500 MG PO TABS: 1500.0000 mg | ORAL_TABLET | Freq: Two times a day (BID) | ORAL | Status: DC

## 2015-07-03 NOTE — Telephone Encounter (Signed)
Lmovm to rtn my call patient hasn't been seen in over a year.

## 2015-07-03 NOTE — Telephone Encounter (Signed)
PT's wife [name] called and needs a refill on his Keppra/Dawn CB# [phone]

## 2015-07-03 NOTE — Telephone Encounter (Signed)
Patients daughter/Kendra returned my call. She states that patient will be out of his Keppra in a few days. I explained to her that patient hasn't been seen in over a year and he needs a rov to continue with medication refills and possible lab work. Patient scheduled for f/u on Monday @ 9:30 am. He will not take his am doses in case we order bloodwork. Will send in short Rx to get him to his visit.

## 2015-07-07 ENCOUNTER — Ambulatory Visit (INDEPENDENT_AMBULATORY_CARE_PROVIDER_SITE_OTHER): Payer: Medicare Other | Admitting: Neurology

## 2015-07-07 ENCOUNTER — Other Ambulatory Visit: Payer: Medicare Other

## 2015-07-07 ENCOUNTER — Encounter: Payer: Self-pay | Admitting: Neurology

## 2015-07-07 VITALS — BP 118/68 | HR 90 | Wt 152.0 lb

## 2015-07-07 DIAGNOSIS — G40209 Localization-related (focal) (partial) symptomatic epilepsy and epileptic syndromes with complex partial seizures, not intractable, without status epilepticus: Secondary | ICD-10-CM

## 2015-07-07 DIAGNOSIS — I63132 Cerebral infarction due to embolism of left carotid artery: Secondary | ICD-10-CM | POA: Diagnosis not present

## 2015-07-07 MED ORDER — LEVETIRACETAM 500 MG PO TABS: ORAL_TABLET | ORAL | Status: DC

## 2015-07-07 MED ORDER — PHENYTOIN SODIUM EXTENDED 100 MG PO CAPS: ORAL_CAPSULE | ORAL | Status: DC

## 2015-07-07 NOTE — Patient Instructions (Signed)
1. Bloodwork for Dilantin and Keppra levels 2. Continue taking Phenytoin 100mg : Take 2 capsules daily 3. Continue taking Keppra 500mg : Take 3 tablets twice a day 4. Continue to monitor 5. Follow-up in 1 year  Seizure Precautions: 1. If medication has been prescribed for you to prevent seizures, take it exactly as directed.  Do not stop taking the medicine without talking to your doctor first, even if you have not had a seizure in a long time.   2. Avoid activities in which a seizure would cause danger to yourself or to others.  Don't operate dangerous machinery, swim alone, or climb in high or dangerous places, such as on ladders, roofs, or girders.  Do not drive unless your doctor says you may.  3. If you have any warning that you may have a seizure, lay down in a safe place where you can't hurt yourself.    4.  No driving for 6 months from last seizure, as per [facility].   Please refer to the following link on the [place] website for more information: http://www.epilepsyfoundation.org/answerplace/Social/driving/drivingu.cfm   5.  Maintain good sleep hygiene. Avoid alcohol.  6.  Contact your doctor if you have any problems that may be related to the medicine you are taking.  7.  Call 911 and bring the patient back to the ED if:        A.  The seizure lasts longer than 5 minutes.       B.  The patient doesn't awaken shortly after the seizure  C.  The patient has new problems such as difficulty seeing, speaking or moving  D.  The patient was injured during the seizure  E.  The patient has a temperature over 102 F (39C)  F.  The patient vomited and now is having trouble breathing

## 2015-07-07 NOTE — Progress Notes (Signed)
NEUROLOGY FOLLOW UP OFFICE NOTE  [name] [id number]  HISTORY OF PRESENT ILLNESS: I had the pleasure of seeing [name] in follow-up in the neurology clinic on 07/07/2015.  The patient was last seen more than a year ago for seizures post-stroke. His daughter had called our office for medication refills and he was instructed to follow-up. He is accompanied by his daughter who helps supplement the history today. He and his daughter deny any seizures over the past year. From our records, no convulsions sine 2009, last "smaller" seizure was in July 2015. His daughter however reports two instances where he was off his routine and did not take his morning medications until noon when she saw him have a look in his eye, not answering her for a minute or so, then coming back. He does not recall this. He feels his memory is "like always." MMSE in February 2016 was 24/30. His daughter feels he is doing pretty well living by himself, she is in charge of his bills but he administers his own medications and she feels pretty confident he takes them as instructed, although he cannot recall when or who told him to reduce Dilantin to 200mg  daily. A year ago, he reported taking Dilantin 300mg  daily and Keppra 1500mg  in AM, 1000mg  in PM. He presents today reporting he takes Dilantin 200mg  daily and Keppra 1500mg  BID with no side effects. I received bloodwork from his PCP from January 2017 with Keppra level of 50 (ref 10-40), Dilantin level was unavailable for review. He reports compliance with other medications, his INR on 07/02/15 was 1.6.   He denies any headaches, dizziness, vision changes, focal numbness/tingling/weakness. He denies any speech difficulties, no falls. He continues to drive and denies getting lost.   HPI: This is a 75 yo RH man with a history of seizures after a cerebral infarction in 2006. He had approximately 5 seizures between 2006 and 2009. Once his medications were properly adjusted,  he had been seizure-free until an [place] stroke in 2013. He is currently on Keppra 1500 mg twice a day and Dilantin 300 mg daily. 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 His daughter says it takes about 2 days for him to get back to normal. They deny any of those types of seizures, however he continues to report "smaller" ones that come on gradually where he "cannot do anything." He states he can talk during those times and denies any confusion, but "the book is not there." He had been seizure-free for many months until he decided to reduce the evening Keppra dose by 1 tablet, and had 2 seizures.  He denies any olfactory/gustatory hallucinations, focal numbness/tingling/weakness. He lives alone, drives and goes out to eat. He administers his own medication. On review of prior records, he has had significant fluctuations in Dilantin level, concerning for possible medication confusion that he would take additional medication. Dilantin level on 01/19/13 was 28.3. He had ranged from 15.1 to 38.1. Concern for memory issues have been discussed with his family in the past by Dr. Carles Dominguez, last August 2014 he called two and a half hours after the appointment time and reported that he got lost and did not know how to get to the office. He stated he was in the building but couldn't find our suite number.   Diagnostic data: I personally reviewed MRI brain  from 12/10/06 which showed encephalomalacia with chronic hemosiderin deposition in the left temporal lobe with insular involvement (hemorrhagic infarct). MRI brain from 06/02/11 showed acute infarct in medial left frontal lobe. No EEGs available for review  PAST MEDICAL HISTORY: Past Medical History  Diagnosis Date  . Seizures ([place])   . Stroke ([place])   . Pancreatitis   . Hiatal hernia   . HTN (hypertension)     pt denies h/o HTN  though it appears he was started on spironolactone during his hospitalization 3/13  . Typical atrial flutter ([place]) 3/13    MEDICATIONS: Current Outpatient Prescriptions on File Prior to Visit  Medication Sig Dispense Refill  . ferrous sulfate 325 (65 FE) MG tablet Take 325 mg by mouth daily.    Marland Kitchen levETIRAcetam (KEPPRA) 500 MG tablet Take 3 tablets (1,500 mg total) by mouth 2 (two) times daily. 30 tablet 0  . phenytoin (DILANTIN) 100 MG ER capsule Patient is taking 2 capsules every morning    . sildenafil (VIAGRA) 50 MG tablet Take 50 mg by mouth daily as needed for erectile dysfunction. Reported on 04/09/2015    . warfarin (COUMADIN) 2.5 MG tablet Take as directed by [facility] 60 tablet 3   No current facility-administered medications on file prior to visit.    ALLERGIES: No Known Allergies  FAMILY HISTORY: Family History  Problem Relation Age of Onset  . Hypertension      SOCIAL HISTORY: Social History   Social History  . Marital Status: Divorced    Spouse Name: N/A  . Number of Children: N/A  . Years of Education: N/A   Occupational History  . Not on file.   Social History Main Topics  . Smoking status: Former Smoker    Quit date: 07/15/1966  . Smokeless tobacco: Never Used  . Alcohol Use: No     Comment: Quit greater than 5 years ago  . Drug Use: No  . Sexual Activity: Not on file   Other Topics Concern  . Not on file   Social History Narrative   Lives alone.  Retired from Actor    REVIEW OF SYSTEMS: Constitutional: No fevers, chills, or sweats, no generalized fatigue, change in appetite Eyes: No visual changes, double vision, eye pain Ear, nose and throat: No hearing loss, ear pain, nasal congestion, sore throat Cardiovascular: No chest pain, palpitations Respiratory:  No shortness of breath at rest or with exertion, wheezes GastrointestinaI: No nausea, vomiting, diarrhea, abdominal pain, fecal incontinence Genitourinary:  No dysuria,  urinary retention or frequency Musculoskeletal:  No neck pain, back pain Integumentary: No rash, pruritus, skin lesions Neurological: as above Psychiatric: No depression, insomnia, anxiety Endocrine: No palpitations, fatigue, diaphoresis, mood swings, change in appetite, change in weight, increased thirst Hematologic/Lymphatic:  No anemia, purpura, petechiae. Allergic/Immunologic: no itchy/runny eyes, nasal congestion, recent allergic reactions, rashes  PHYSICAL EXAM: Filed Vitals:   07/07/15 0938  BP: 118/68  Pulse: 90   General: No acute distress Head:  Normocephalic/atraumatic Neck: supple, no paraspinal tenderness, full range of motion Heart:  Regular rate and rhythm Lungs:  Clear to auscultation bilaterally Back: No paraspinal tenderness Skin/Extremities: No rash, no edema Neurological Exam: alert and oriented to person, place, time. No aphasia or dysarthria. Fund of knowledge is appropriate.  Remote memory intact.  Attention and concentration are normal. Able to name objects and repeat phrases but with some paraphasic errors. Clock drawing 4/5  MMSE - Mini Mental State Exam 07/07/2015 05/16/2014 10/09/2013 11/16/2012  Orientation to time  5 3 5 3   Orientation to Place 5 5 5 5   Registration 3 3 3 3   Attention/ Calculation 0 3 1 0  Recall 0 1 1 0  Language- name 2 objects 2 2 2 2   Language- repeat 1 1 1  0  Language- follow 3 step command 3 3 3 3   Language- read & follow direction 1 1 1 1   Write a sentence 1 1 1 1   Copy design 1 1 1 1   Total score 22 24 24 19    Cranial nerves: Pupils equal, round, reactive to light. Extraocular movements intact with no nystagmus. Visual fields full. Facial sensation intact. No facial asymmetry. Tongue, uvula, palate midline.  Motor: Bulk and tone normal, muscle strength 5/5 throughout with no pronator drift.  Sensation to light touch intact.  No extinction to double simultaneous stimulation.  Deep tendon reflexes 2+ throughout, toes downgoing.   Finger to nose testing intact.  Gait narrow-based and steady, able to tandem walk adequately.  Romberg negative.  IMPRESSION: This is a 75 yo RH man with a history of atrial flutter on chronic anticoagulation, left insular stroke with subsequent focal to bilateral tonic-clonic seizures. He has not had any convulsions since 2009, no focal seizures since 2015, however patient is a poor historian. His daughter reports brief episodes of unresponsiveness that she has witnessed twice, both when he was off schedule with his medications. He is otherwise doing well per patient and daughter. His Dilantin dose had been reduced to 200mg  daily by his PCP, they are unsure when. Since he has not had any seizures, would continue on current dose of Dilantin 200mg  daily and Keppra 1500mg  BID. Baseline Dilantin and Keppra levels will be checked today. He has a history of fluctuating Dilantin levels, unclear if due to memory loss affecting medication compliance. I have asked them to call our office before making any medication changes. We again discussed his memory, MMSE today 22/30 (previously 24/30 in February 2016), indicating mild dementia. Continue to monitor memory, in the past we have repeatedly recommended he have more supervision at home. This was again discussed with the daughter, as well as monitoring driving. He is aware of [place] driving laws to stop driving after a seizure until 6 months seizure-free. He will follow-up in 1 year or earlier if needed.  Thank you for allowing me to participate in his care.  Please do not hesitate to call for any questions or concerns.  The duration of this appointment visit was 25 minutes of face-to-face time with the patient.  Greater than 50% of this time was spent in counseling, explanation of diagnosis, planning of further management, and coordination of care.   Ellouise Newer, [name]   CC: Dr. Felipa Eth

## 2015-07-08 LAB — PHENYTOIN LEVEL, TOTAL: PHENYTOIN LVL: 6.5 ug/mL — AB (ref 10.0–20.0)

## 2015-07-09 LAB — LEVETIRACETAM LEVEL: Keppra (Levetiracetam): 26.6 ug/mL

## 2015-07-10 ENCOUNTER — Telehealth: Payer: Self-pay | Admitting: Family Medicine

## 2015-07-10 NOTE — Telephone Encounter (Signed)
-----   Message from Cameron Sprang, MD sent at 07/10/2015  8:40 AM EDT ----- Daughter requests she be called with results. Pls let her know Keppra level is good, Dilantin level is low but as long as he is not having seizures, continue on current doses of medications as we had discussed in office. Thanks

## 2015-07-10 NOTE — Telephone Encounter (Signed)
Patient's daughter [name] was notified of results & advisement.

## 2015-07-16 ENCOUNTER — Ambulatory Visit (INDEPENDENT_AMBULATORY_CARE_PROVIDER_SITE_OTHER): Payer: Medicare Other | Admitting: *Deleted

## 2015-07-16 DIAGNOSIS — Z7901 Long term (current) use of anticoagulants: Secondary | ICD-10-CM

## 2015-07-16 DIAGNOSIS — I4892 Unspecified atrial flutter: Secondary | ICD-10-CM

## 2015-07-16 DIAGNOSIS — I483 Typical atrial flutter: Secondary | ICD-10-CM

## 2015-07-16 DIAGNOSIS — Z5181 Encounter for therapeutic drug level monitoring: Secondary | ICD-10-CM | POA: Diagnosis not present

## 2015-07-16 LAB — POCT INR: INR: 2

## 2015-08-06 ENCOUNTER — Ambulatory Visit (INDEPENDENT_AMBULATORY_CARE_PROVIDER_SITE_OTHER): Payer: Medicare Other | Admitting: *Deleted

## 2015-08-06 DIAGNOSIS — Z5181 Encounter for therapeutic drug level monitoring: Secondary | ICD-10-CM

## 2015-08-06 DIAGNOSIS — Z7901 Long term (current) use of anticoagulants: Secondary | ICD-10-CM | POA: Diagnosis not present

## 2015-08-06 DIAGNOSIS — I4892 Unspecified atrial flutter: Secondary | ICD-10-CM

## 2015-08-06 DIAGNOSIS — I483 Typical atrial flutter: Secondary | ICD-10-CM | POA: Diagnosis not present

## 2015-08-06 LAB — POCT INR: INR: 2

## 2015-08-08 ENCOUNTER — Emergency Department (HOSPITAL_COMMUNITY)
Admission: EM | Admit: 2015-08-08 | Discharge: 2015-08-08 | Disposition: A | Discharge status: Home / Self Care | Payer: Medicare Other | Attending: Emergency Medicine | Admitting: Emergency Medicine

## 2015-08-08 ENCOUNTER — Encounter (HOSPITAL_COMMUNITY): Payer: Self-pay | Admitting: *Deleted

## 2015-08-08 DIAGNOSIS — Y9389 Activity, other specified: Secondary | ICD-10-CM | POA: Diagnosis not present

## 2015-08-08 DIAGNOSIS — Z79899 Other long term (current) drug therapy: Secondary | ICD-10-CM | POA: Diagnosis not present

## 2015-08-08 DIAGNOSIS — Y998 Other external cause status: Secondary | ICD-10-CM | POA: Insufficient documentation

## 2015-08-08 DIAGNOSIS — R4182 Altered mental status, unspecified: Secondary | ICD-10-CM | POA: Diagnosis not present

## 2015-08-08 DIAGNOSIS — Z87891 Personal history of nicotine dependence: Secondary | ICD-10-CM | POA: Diagnosis not present

## 2015-08-08 DIAGNOSIS — Z8673 Personal history of transient ischemic attack (TIA), and cerebral infarction without residual deficits: Secondary | ICD-10-CM | POA: Diagnosis not present

## 2015-08-08 DIAGNOSIS — Z7901 Long term (current) use of anticoagulants: Secondary | ICD-10-CM | POA: Diagnosis not present

## 2015-08-08 DIAGNOSIS — R569 Unspecified convulsions: Secondary | ICD-10-CM | POA: Diagnosis not present

## 2015-08-08 DIAGNOSIS — Y92410 Unspecified street and highway as the place of occurrence of the external cause: Secondary | ICD-10-CM | POA: Diagnosis not present

## 2015-08-08 DIAGNOSIS — I10 Essential (primary) hypertension: Secondary | ICD-10-CM | POA: Diagnosis not present

## 2015-08-08 DIAGNOSIS — Z041 Encounter for examination and observation following transport accident: Secondary | ICD-10-CM | POA: Diagnosis present

## 2015-08-08 DIAGNOSIS — I6789 Other cerebrovascular disease: Secondary | ICD-10-CM | POA: Diagnosis not present

## 2015-08-08 DIAGNOSIS — G40909 Epilepsy, unspecified, not intractable, without status epilepticus: Secondary | ICD-10-CM | POA: Diagnosis not present

## 2015-08-08 DIAGNOSIS — Z8719 Personal history of other diseases of the digestive system: Secondary | ICD-10-CM | POA: Diagnosis not present

## 2015-08-08 DIAGNOSIS — R269 Unspecified abnormalities of gait and mobility: Secondary | ICD-10-CM | POA: Diagnosis not present

## 2015-08-08 LAB — CBC
HCT: 32.9 % — ABNORMAL LOW (ref 39.0–52.0)
Hemoglobin: 10.4 g/dL — ABNORMAL LOW (ref 13.0–17.0)
MCH: 27.8 pg (ref 26.0–34.0)
MCHC: 31.6 g/dL (ref 30.0–36.0)
MCV: 88 fL (ref 78.0–100.0)
PLATELETS: 253 10*3/uL (ref 150–400)
RBC: 3.74 MIL/uL — ABNORMAL LOW (ref 4.22–5.81)
RDW: 23.5 % — AB (ref 11.5–15.5)
WBC: 6.3 10*3/uL (ref 4.0–10.5)

## 2015-08-08 LAB — BASIC METABOLIC PANEL
Anion gap: 9 (ref 5–15)
Anion gap: 7 (ref 5–15)
BUN: 13 mg/dL (ref 6–20)
BUN: 13 mg/dL (ref 6–20)
CALCIUM: 8.7 mg/dL — AB (ref 8.9–10.3)
CO2: 26 mmol/L (ref 22–32)
CO2: 28 mmol/L (ref 22–32)
CREATININE: 0.84 mg/dL (ref 0.61–1.24)
CREATININE: 0.75 mg/dL (ref 0.61–1.24)
Calcium: 8.6 mg/dL — ABNORMAL LOW (ref 8.9–10.3)
Chloride: 103 mmol/L (ref 101–111)
Chloride: 105 mmol/L (ref 101–111)
GFR calc Af Amer: >60 mL/min (ref >60)
GFR calc Af Amer: >60 mL/min (ref >60)
GLUCOSE: 86 mg/dL (ref 65–99)
Glucose, Bld: 93 mg/dL (ref 65–99)
Potassium: 5.8 mmol/L — ABNORMAL HIGH (ref 3.5–5.1)
Potassium: 4.2 mmol/L (ref 3.5–5.1)
SODIUM: 138 mmol/L (ref 135–145)
SODIUM: 140 mmol/L (ref 135–145)

## 2015-08-08 LAB — PHENYTOIN LEVEL, TOTAL: PHENYTOIN LVL: 9.4 ug/mL — AB (ref 10.0–20.0)

## 2015-08-08 MED ORDER — SODIUM CHLORIDE 0.9 % IV SOLN
500.0000 mg | Freq: Once | INTRAVENOUS | Status: AC
  Administered 2015-08-08: 500 mg via INTRAVENOUS
  Filled 2015-08-08: qty 5

## 2015-08-08 MED ORDER — LEVETIRACETAM 500 MG PO TABS: 500.0000 mg | ORAL_TABLET | Freq: Two times a day (BID) | ORAL | Status: DC
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
## 2015-08-08 NOTE — Discharge Instructions (Signed)
Thank you for allowing [place] to take care of you this evening, your testing has been unremarkable, you have not had any further seizures after receiving Keppra, I have discussed her care with the neurologist to one she to increase your dose to 3 tablets in the morning and 3 tablets in the evening. This is 1500 mg in the morning and 1500 mg in the evening. If you continue to have seizures please return to the emergency department, please call your neurologist today to schedule follow-up within the next 2 weeks.  You are not to drive until cleared by your neurologist.  Please obtain all of your results from medical records or have your doctors office obtain the results - share them with your doctor - you should be seen at your doctors office in the next 2 days. Call today to arrange your follow up. Take the medications as prescribed. Please review all of the medicines and only take them if you do not have an allergy to them. Please be aware that if you are taking birth control pills, taking other prescriptions, ESPECIALLY ANTIBIOTICS may make the birth control ineffective - if this is the case, either do not engage in sexual activity or use alternative methods of birth control such as condoms until you have finished the medicine and your family doctor says it is OK to restart them. If you are on a blood thinner such as COUMADIN, be aware that any other medicine that you take may cause the coumadin to either work too much, or not enough - you should have your coumadin level rechecked in next 7 days if this is the case.  ?  It is also a possibility that you have an allergic reaction to any of the medicines that you have been prescribed - Everybody reacts differently to medications and while MOST people have no trouble with most medicines, you may have a reaction such as nausea, vomiting, rash, swelling, shortness of breath. If this is the case, please stop taking the medicine immediately and contact your physician.    ?  You should return to the ER if you develop severe or worsening symptoms.
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
## 2015-08-08 NOTE — ED Notes (Signed)
MD at bedside. 

## 2015-08-08 NOTE — ED Notes (Signed)
Pt stable, ambulatory, states understanding of discharge instructions 

## 2015-08-11 ENCOUNTER — Telehealth: Payer: Self-pay | Admitting: Neurology

## 2015-08-11 NOTE — Telephone Encounter (Signed)
Attempted to reach daughter again. Again line just rang until it disconnected. No vm to leave message on. Will close until daughter calls back.
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
## 2015-08-11 NOTE — Telephone Encounter (Signed)
He is supposed to be taking 1500mg  twice daily of Keppra

## 2015-08-11 NOTE — Telephone Encounter (Signed)
Patient daughter called and needs to speak to some one about medication change that was done in the er at [facility] over the weekend please call [name]  [phone]

## 2015-08-22 ENCOUNTER — Encounter: Payer: Self-pay | Admitting: Neurology

## 2015-09-03 ENCOUNTER — Ambulatory Visit (INDEPENDENT_AMBULATORY_CARE_PROVIDER_SITE_OTHER): Payer: Medicare Other | Admitting: *Deleted

## 2015-09-03 DIAGNOSIS — Z5181 Encounter for therapeutic drug level monitoring: Secondary | ICD-10-CM

## 2015-09-03 DIAGNOSIS — I483 Typical atrial flutter: Secondary | ICD-10-CM

## 2015-09-03 DIAGNOSIS — I4892 Unspecified atrial flutter: Secondary | ICD-10-CM

## 2015-09-03 DIAGNOSIS — Z7901 Long term (current) use of anticoagulants: Secondary | ICD-10-CM | POA: Diagnosis not present

## 2015-09-03 LAB — POCT INR: INR: 1.9

## 2015-09-04 ENCOUNTER — Telehealth: Payer: Self-pay | Admitting: Neurology

## 2015-09-04 DIAGNOSIS — G40209 Localization-related (focal) (partial) symptomatic epilepsy and epileptic syndromes with complex partial seizures, not intractable, without status epilepticus: Secondary | ICD-10-CM

## 2015-09-04 NOTE — Telephone Encounter (Signed)
[name] 05/02/1940. His daughter [name] called. He was seen at the ER since his last appointment with Dr. [name]. He had a black out spell. His medication was changed in the ER. The daughter would like you to please call her at [phone]. She said she is going to do 3 Dilantin in the morning and leave the keppra at 3 tablets twice a day. Apparently while at the hospital they added another Keppra. Thank you

## 2015-09-08 ENCOUNTER — Telehealth: Payer: Self-pay | Admitting: Neurology

## 2015-09-08 ENCOUNTER — Other Ambulatory Visit: Payer: Self-pay

## 2015-09-08 DIAGNOSIS — G40209 Localization-related (focal) (partial) symptomatic epilepsy and epileptic syndromes with complex partial seizures, not intractable, without status epilepticus: Secondary | ICD-10-CM

## 2015-09-08 MED ORDER — PHENYTOIN SODIUM EXTENDED 100 MG PO CAPS: 300.0000 mg | ORAL_CAPSULE | Freq: Every day | ORAL | Status: DC

## 2015-09-08 NOTE — Telephone Encounter (Signed)
RX sent in reflecting new dose of 300 mg daily.

## 2015-09-08 NOTE — Addendum Note (Signed)
Addended by: Gerda Diss A on: 09/08/2015 12:01 PM   Modules accepted: Orders

## 2015-09-22 ENCOUNTER — Ambulatory Visit (INDEPENDENT_AMBULATORY_CARE_PROVIDER_SITE_OTHER): Payer: Medicare Other | Admitting: *Deleted

## 2015-09-22 DIAGNOSIS — Z7901 Long term (current) use of anticoagulants: Secondary | ICD-10-CM

## 2015-09-22 DIAGNOSIS — I4892 Unspecified atrial flutter: Secondary | ICD-10-CM

## 2015-09-22 DIAGNOSIS — I483 Typical atrial flutter: Secondary | ICD-10-CM | POA: Diagnosis not present

## 2015-09-22 DIAGNOSIS — Z5181 Encounter for therapeutic drug level monitoring: Secondary | ICD-10-CM

## 2015-09-22 LAB — POCT INR: INR: 2.9

## 2015-10-07 DIAGNOSIS — Z79899 Other long term (current) drug therapy: Secondary | ICD-10-CM | POA: Diagnosis not present

## 2015-10-07 DIAGNOSIS — D649 Anemia, unspecified: Secondary | ICD-10-CM | POA: Diagnosis not present

## 2015-10-07 DIAGNOSIS — G40909 Epilepsy, unspecified, not intractable, without status epilepticus: Secondary | ICD-10-CM | POA: Diagnosis not present

## 2015-10-07 DIAGNOSIS — I10 Essential (primary) hypertension: Secondary | ICD-10-CM | POA: Diagnosis not present

## 2015-10-07 DIAGNOSIS — I6930 Unspecified sequelae of cerebral infarction: Secondary | ICD-10-CM | POA: Diagnosis not present

## 2015-10-07 LAB — CBC AND DIFFERENTIAL
HEMATOCRIT: 30 % — AB (ref 41–53)
Hemoglobin: 9.3 g/dL — AB (ref 13.5–17.5)
PLATELETS: 241 10*3/uL (ref 150–399)
WBC: 4.4 10^3/mL

## 2015-10-07 LAB — HEPATIC FUNCTION PANEL
ALT: 10 U/L (ref 10–40)
AST: 16 U/L (ref 14–40)
Bilirubin, Total: 0.5 mg/dL

## 2015-10-07 LAB — BASIC METABOLIC PANEL
BUN: 16 mg/dL (ref 4–21)
Creatinine: 0.7 mg/dL (ref 0.6–1.3)
GLUCOSE: 87 mg/dL
Potassium: 3.8 mmol/L (ref 3.4–5.3)
Sodium: 144 mmol/L (ref 137–147)

## 2015-10-13 ENCOUNTER — Other Ambulatory Visit: Payer: Self-pay | Admitting: Geriatric Medicine

## 2015-10-13 DIAGNOSIS — I10 Essential (primary) hypertension: Secondary | ICD-10-CM | POA: Diagnosis not present

## 2015-10-13 DIAGNOSIS — R1084 Generalized abdominal pain: Secondary | ICD-10-CM

## 2015-10-13 DIAGNOSIS — G40909 Epilepsy, unspecified, not intractable, without status epilepticus: Secondary | ICD-10-CM | POA: Diagnosis not present

## 2015-10-14 ENCOUNTER — Other Ambulatory Visit: Payer: Self-pay | Admitting: *Deleted

## 2015-10-14 ENCOUNTER — Telehealth: Payer: Self-pay | Admitting: Neurology

## 2015-10-14 MED ORDER — WARFARIN SODIUM 2.5 MG PO TABS: ORAL_TABLET | ORAL | 3 refills | Status: DC

## 2015-10-14 NOTE — Telephone Encounter (Signed)
Left message for Brittany to call me back.

## 2015-10-14 NOTE — Telephone Encounter (Signed)
Received fax from PCP office that his Dilantin and Keppra levels are elevated: Dilantin level 10/08/15: 26 (ref 10-20) Keppra level 10/08/15: 54.5 (ref 10-40)  Please call back his nurse Leane Call, let her know would recommend to recheck a trough Dilantin level, make sure he has blood drawn before he takes his medication. No need to repeat Keppra level, continue current dose even with this level. Thanks

## 2015-10-14 NOTE — Telephone Encounter (Signed)
[place] called back and I gave her instructions per Dr. [name].

## 2015-10-15 ENCOUNTER — Ambulatory Visit (INDEPENDENT_AMBULATORY_CARE_PROVIDER_SITE_OTHER): Payer: Medicare Other | Admitting: *Deleted

## 2015-10-15 ENCOUNTER — Other Ambulatory Visit: Payer: Medicare Other

## 2015-10-15 DIAGNOSIS — Z5181 Encounter for therapeutic drug level monitoring: Secondary | ICD-10-CM

## 2015-10-15 DIAGNOSIS — Z7901 Long term (current) use of anticoagulants: Secondary | ICD-10-CM

## 2015-10-15 DIAGNOSIS — I4892 Unspecified atrial flutter: Secondary | ICD-10-CM

## 2015-10-15 LAB — POCT INR: INR: 4.7

## 2015-10-16 ENCOUNTER — Other Ambulatory Visit: Payer: Medicare Other

## 2015-10-16 ENCOUNTER — Telehealth: Payer: Self-pay | Admitting: Neurology

## 2015-10-16 DIAGNOSIS — R7889 Finding of other specified substances, not normally found in blood: Secondary | ICD-10-CM

## 2015-10-16 NOTE — Telephone Encounter (Signed)
Thanks

## 2015-10-16 NOTE — Telephone Encounter (Signed)
Patient walked into office needing blood work- it looks like it should have been ordered from PCP but since patient was here ordered Dilantin level to have drawn at Endocrinology lab.   Dr. [name] - fyi.

## 2015-10-17 ENCOUNTER — Ambulatory Visit
Admission: RE | Admit: 2015-10-17 | Discharge: 2015-10-17 | Disposition: A | Discharge status: Home / Self Care | Payer: Medicare Other | Source: Ambulatory Visit | Attending: Geriatric Medicine | Admitting: Geriatric Medicine

## 2015-10-17 DIAGNOSIS — R1084 Generalized abdominal pain: Secondary | ICD-10-CM

## 2015-10-17 DIAGNOSIS — D1803 Hemangioma of intra-abdominal structures: Secondary | ICD-10-CM | POA: Diagnosis not present

## 2015-10-17 MED ORDER — IOPAMIDOL (ISOVUE-300) INJECTION 61%
100.0000 mL | Freq: Once | INTRAVENOUS | Status: AC | PRN
  Administered 2015-10-17: 100 mL via INTRAVENOUS

## 2015-10-20 LAB — PHENYTOIN LEVEL, FREE AND TOTAL: PHENYTOIN FREE: 2.4 mg/L — AB (ref 1.0–2.0)

## 2015-10-28 ENCOUNTER — Ambulatory Visit: Payer: Medicare Other | Admitting: Neurology

## 2015-10-29 ENCOUNTER — Ambulatory Visit (INDEPENDENT_AMBULATORY_CARE_PROVIDER_SITE_OTHER): Payer: Medicare Other | Admitting: *Deleted

## 2015-10-29 DIAGNOSIS — I4892 Unspecified atrial flutter: Secondary | ICD-10-CM | POA: Diagnosis not present

## 2015-10-29 DIAGNOSIS — Z5181 Encounter for therapeutic drug level monitoring: Secondary | ICD-10-CM | POA: Diagnosis not present

## 2015-10-29 DIAGNOSIS — Z7901 Long term (current) use of anticoagulants: Secondary | ICD-10-CM | POA: Diagnosis not present

## 2015-10-29 LAB — POCT INR: INR: 3

## 2015-11-03 DIAGNOSIS — I6930 Unspecified sequelae of cerebral infarction: Secondary | ICD-10-CM | POA: Diagnosis not present

## 2015-11-05 ENCOUNTER — Ambulatory Visit (INDEPENDENT_AMBULATORY_CARE_PROVIDER_SITE_OTHER): Payer: Medicare Other | Admitting: Neurology

## 2015-11-05 ENCOUNTER — Encounter: Payer: Self-pay | Admitting: Neurology

## 2015-11-05 VITALS — BP 108/54 | HR 88 | Temp 98.7°F | Ht 69.0 in | Wt 142.3 lb

## 2015-11-05 DIAGNOSIS — F03A0 Unspecified dementia, mild, without behavioral disturbance, psychotic disturbance, mood disturbance, and anxiety: Secondary | ICD-10-CM

## 2015-11-05 DIAGNOSIS — G40209 Localization-related (focal) (partial) symptomatic epilepsy and epileptic syndromes with complex partial seizures, not intractable, without status epilepticus: Secondary | ICD-10-CM | POA: Diagnosis not present

## 2015-11-05 DIAGNOSIS — F0390 Unspecified dementia without behavioral disturbance: Secondary | ICD-10-CM | POA: Diagnosis not present

## 2015-11-05 DIAGNOSIS — I63132 Cerebral infarction due to embolism of left carotid artery: Secondary | ICD-10-CM
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
## 2015-11-05 NOTE — Patient Instructions (Signed)
1. Continue Phenytoin 100mg : Take 3 capsules daily 2. Continue Levetiracetam 500mg : Take 3 tablets twice a day 3. Schedule 72-hour ambulatory EEG 4. As per [place] driving laws, no driving 5. Follow-up in 2 months, call for any changes  Seizure Precautions: 1. If medication has been prescribed for you to prevent seizures, take it exactly as directed.  Do not stop taking the medicine without talking to your doctor first, even if you have not had a seizure in a long time.   2. Avoid activities in which a seizure would cause danger to yourself or to others.  Don't operate dangerous machinery, swim alone, or climb in high or dangerous places, such as on ladders, roofs, or girders.  Do not drive unless your doctor says you may.  3. If you have any warning that you may have a seizure, lay down in a safe place where you can't hurt yourself.    4.  No driving for 6 months from last seizure, as per [name].   Please refer to the following link on the [facility] website for more information: http://www.epilepsyfoundation.org/answerplace/Social/driving/drivingu.cfm   5.  Maintain good sleep hygiene. Avoid alcohol.  6.  Contact your doctor if you have any problems that may be related to the medicine you are taking.  7.  Call 911 and bring the patient back to the ED if:        A.  The seizure lasts longer than 5 minutes.       B.  The patient doesn't awaken shortly after the seizure  C.  The patient has new problems such as difficulty seeing, speaking or moving  D.  The patient was injured during the seizure  E.  The patient has a temperature over 102 F (39C)  F.  The patient vomited and now is having trouble breathing

## 2015-11-06 ENCOUNTER — Encounter: Payer: Self-pay | Admitting: *Deleted

## 2015-11-10 NOTE — Telephone Encounter (Signed)
ok 

## 2015-11-12 ENCOUNTER — Telehealth: Payer: Self-pay | Admitting: Neurology

## 2015-11-12 NOTE — Telephone Encounter (Signed)
Records from PCP reviewed:  Total Dilantin level 11/03/15: 24.66 Keppra level 10/07/15: 54.5 CBC and CMP normal.

## 2015-11-17 ENCOUNTER — Other Ambulatory Visit: Payer: Medicare Other

## 2015-11-26 ENCOUNTER — Ambulatory Visit (INDEPENDENT_AMBULATORY_CARE_PROVIDER_SITE_OTHER): Payer: Medicare Other | Admitting: *Deleted

## 2015-11-26 DIAGNOSIS — Z5181 Encounter for therapeutic drug level monitoring: Secondary | ICD-10-CM | POA: Diagnosis not present

## 2015-11-26 DIAGNOSIS — Z7901 Long term (current) use of anticoagulants: Secondary | ICD-10-CM

## 2015-11-26 DIAGNOSIS — I4892 Unspecified atrial flutter: Secondary | ICD-10-CM | POA: Diagnosis not present

## 2015-11-26 LAB — POCT INR: INR: 5.8

## 2015-11-27 ENCOUNTER — Encounter (HOSPITAL_COMMUNITY): Payer: Self-pay | Admitting: Emergency Medicine

## 2015-11-27 ENCOUNTER — Inpatient Hospital Stay (HOSPITAL_COMMUNITY)
Admission: EM | Admit: 2015-11-27 | Discharge: 2015-12-02 | DRG: 377 | Disposition: A | Discharge status: Home Health | Payer: Medicare Other | Attending: Internal Medicine | Admitting: Internal Medicine

## 2015-11-27 DIAGNOSIS — D649 Anemia, unspecified: Secondary | ICD-10-CM | POA: Diagnosis present

## 2015-11-27 DIAGNOSIS — D6832 Hemorrhagic disorder due to extrinsic circulating anticoagulants: Secondary | ICD-10-CM | POA: Diagnosis present

## 2015-11-27 DIAGNOSIS — Z681 Body mass index (BMI) 19 or less, adult: Secondary | ICD-10-CM

## 2015-11-27 DIAGNOSIS — Z79899 Other long term (current) drug therapy: Secondary | ICD-10-CM

## 2015-11-27 DIAGNOSIS — K3189 Other diseases of stomach and duodenum: Secondary | ICD-10-CM | POA: Diagnosis present

## 2015-11-27 DIAGNOSIS — Y95 Nosocomial condition: Secondary | ICD-10-CM | POA: Diagnosis not present

## 2015-11-27 DIAGNOSIS — Z7901 Long term (current) use of anticoagulants: Secondary | ICD-10-CM

## 2015-11-27 DIAGNOSIS — K2951 Unspecified chronic gastritis with bleeding: Secondary | ICD-10-CM | POA: Diagnosis not present

## 2015-11-27 DIAGNOSIS — D72829 Elevated white blood cell count, unspecified: Secondary | ICD-10-CM

## 2015-11-27 DIAGNOSIS — K921 Melena: Secondary | ICD-10-CM | POA: Diagnosis not present

## 2015-11-27 DIAGNOSIS — I4891 Unspecified atrial fibrillation: Secondary | ICD-10-CM | POA: Diagnosis present

## 2015-11-27 DIAGNOSIS — K922 Gastrointestinal hemorrhage, unspecified: Secondary | ICD-10-CM | POA: Diagnosis not present

## 2015-11-27 DIAGNOSIS — Z87891 Personal history of nicotine dependence: Secondary | ICD-10-CM

## 2015-11-27 DIAGNOSIS — J189 Pneumonia, unspecified organism: Secondary | ICD-10-CM | POA: Diagnosis not present

## 2015-11-27 DIAGNOSIS — G40209 Localization-related (focal) (partial) symptomatic epilepsy and epileptic syndromes with complex partial seizures, not intractable, without status epilepticus: Secondary | ICD-10-CM | POA: Diagnosis present

## 2015-11-27 DIAGNOSIS — F0390 Unspecified dementia without behavioral disturbance: Secondary | ICD-10-CM | POA: Diagnosis present

## 2015-11-27 DIAGNOSIS — I4892 Unspecified atrial flutter: Secondary | ICD-10-CM | POA: Diagnosis present

## 2015-11-27 DIAGNOSIS — G40909 Epilepsy, unspecified, not intractable, without status epilepticus: Secondary | ICD-10-CM

## 2015-11-27 DIAGNOSIS — Z8249 Family history of ischemic heart disease and other diseases of the circulatory system: Secondary | ICD-10-CM

## 2015-11-27 DIAGNOSIS — D500 Iron deficiency anemia secondary to blood loss (chronic): Secondary | ICD-10-CM | POA: Diagnosis not present

## 2015-11-27 DIAGNOSIS — I10 Essential (primary) hypertension: Secondary | ICD-10-CM | POA: Diagnosis present

## 2015-11-27 DIAGNOSIS — K449 Diaphragmatic hernia without obstruction or gangrene: Secondary | ICD-10-CM | POA: Diagnosis present

## 2015-11-27 DIAGNOSIS — J690 Pneumonitis due to inhalation of food and vomit: Secondary | ICD-10-CM | POA: Diagnosis not present

## 2015-11-27 DIAGNOSIS — Z8673 Personal history of transient ischemic attack (TIA), and cerebral infarction without residual deficits: Secondary | ICD-10-CM

## 2015-11-27 DIAGNOSIS — F0280 Dementia in other diseases classified elsewhere without behavioral disturbance: Secondary | ICD-10-CM | POA: Diagnosis present

## 2015-11-27 DIAGNOSIS — D72819 Decreased white blood cell count, unspecified: Secondary | ICD-10-CM

## 2015-11-27 DIAGNOSIS — D62 Acute posthemorrhagic anemia: Secondary | ICD-10-CM | POA: Diagnosis present

## 2015-11-27 DIAGNOSIS — G309 Alzheimer's disease, unspecified: Secondary | ICD-10-CM

## 2015-11-27 LAB — BASIC METABOLIC PANEL
ANION GAP: 7 (ref 5–15)
BUN: 21 mg/dL — ABNORMAL HIGH (ref 6–20)
CALCIUM: 8.9 mg/dL (ref 8.9–10.3)
CO2: 26 mmol/L (ref 22–32)
Chloride: 106 mmol/L (ref 101–111)
Creatinine, Ser: 0.79 mg/dL (ref 0.61–1.24)
GLUCOSE: 126 mg/dL — AB (ref 65–99)
POTASSIUM: 4.6 mmol/L (ref 3.5–5.1)
Sodium: 139 mmol/L (ref 135–145)

## 2015-11-27 LAB — CBC
HEMATOCRIT: 16.7 % — AB (ref 39.0–52.0)
HEMOGLOBIN: 5.1 g/dL — AB (ref 13.0–17.0)
MCH: 25.1 pg — ABNORMAL LOW (ref 26.0–34.0)
MCHC: 30.5 g/dL (ref 30.0–36.0)
MCV: 82.3 fL (ref 78.0–100.0)
Platelets: 367 10*3/uL (ref 150–400)
RBC: 2.03 MIL/uL — AB (ref 4.22–5.81)
RDW: 22.5 % — ABNORMAL HIGH (ref 11.5–15.5)
WBC: 7.5 10*3/uL (ref 4.0–10.5)

## 2015-11-27 LAB — CBG MONITORING, ED: GLUCOSE-CAPILLARY: 115 mg/dL — AB (ref 65–99)

## 2015-11-27 MED ORDER — SODIUM CHLORIDE 0.9 % IV SOLN
Freq: Once | INTRAVENOUS | Status: AC
  Administered 2015-11-28: via INTRAVENOUS

## 2015-11-27 NOTE — ED Notes (Signed)
Critical HGB of 5.1 given to MD [facility]

## 2015-11-27 NOTE — ED Triage Notes (Signed)
Pt here with 2 day history of generalized weakness, fatigue, loss of appetite. Pt denies recent medication changes. Pt denies focal neuro symptoms, CP, SOB. Pt sts he cannot perform ADLs without being exhausted.

## 2015-11-27 NOTE — ED Provider Notes (Signed)
[place] DEPT Provider Note   CSN: [id number] Arrival date & time: 11/27/15  1903  By signing my name below, I, [name]. [name], attest that this documentation has been prepared under the direction and in the presence of [name], MD.  Electronically Signed: [name]. [name], ED Scribe. 11/27/15. 11:25 PM.    History   Chief Complaint Chief Complaint  Patient presents with  . Weakness  . Fatigue   The history is provided by the patient and a relative. No language interpreter was used.    HPI Comments: [name] is a 75 y.o. male with a PMHx of HTN, stroke, and typical heart flutter who presents to the Emergency Department complaining of constant, unchanged generalized weakness and fatigue onset yesterday at approximately noon. Symptoms are exacerbated with exertion. No alleviating factors reported. Pt also reports loss of appetite and "a fluttering in my chest". Pt states he has noted black colored stools x 1 week.  [name] is on Coumadin daily for history of stroke. Last PT-INR checked this week with a reading of 5.8. No other recent medication changes. No recent fever, chills, nausea, vomiting, or chest pain.  PCP: Brent Argyle, MD    Past Medical History:  Diagnosis Date  . Hiatal hernia   . HTN (hypertension)    pt denies h/o HTN though it appears he was started on spironolactone during his hospitalization 3/13  . Pancreatitis   . Seizures ([name])   . Stroke ([place])   . Typical atrial flutter ([name]) 3/13    Patient Active Problem List   Diagnosis Date Noted  . 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 Encounter for therapeutic drug monitoring 08/10/2013  . Dementia (AD) 11/16/2012  . Long term (current) use of anticoagulants 06/30/2011  . CVA (cerebral infarction) 06/08/2011  . Atrial flutter ([name]) 06/04/2011  . Status  epilepticus ([name]) 06/02/2011  . 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 SINUSITIS, CHRONIC NEC 12/14/2006  . ANEMIA, DEFICIENCY NOS 10/11/2006  . ABUSE, ALCOHOL, CONTINUOUS 10/11/2006  . 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 ABNORMAL RESULT, FUNCTION STUDY, LIVER 10/11/2006  . CEREBROVASCULAR ACCIDENT, HX OF 10/11/2006  . DEMENTIA 09/15/2006  . HYPERTENSION 09/15/2006  . CVA 09/15/2006  . ALLERGIC RHINITIS 09/15/2006  . CIRRHOSIS, ALCOHOLIC, LIVER [id number]  . SEIZURE DISORDER 09/15/2006  . PANCREATITIS, HX OF 09/15/2006    Past Surgical History:  Procedure Laterality Date  . no surgical history  06/2011       Home Medications    Prior to Admission medications   Medication Sig Start Date End Date Taking? Authorizing Provider  ferrous sulfate 325 (65 FE) MG tablet Take 325 mg by mouth daily.    Historical Provider, MD  levETIRAcetam (KEPPRA) 500 MG tablet Take 1 tablet (500 mg total) by mouth 2 (two) times daily. 08/08/15   Brent Chapel, MD  phenytoin (DILANTIN) 100 MG ER capsule Take 3 capsules (300 mg total) by mouth daily. 09/08/15   Brent Partridge, DO  warfarin (COUMADIN) 2.5 MG tablet Take as directed by [facility]in Clinic 10/14/15   Brent Grayer, MD    Family History Family History  Problem Relation Age of Onset  . Hypertension      Social History Social History  Substance Use Topics  . Smoking status: Former Smoker    Quit date: 07/15/1966  . Smokeless tobacco: Never Used  . Alcohol use No     Comment: Quit  greater than 5 years ago     Allergies   Review of patient's allergies indicates no known allergies.   Review of Systems Review of Systems  Constitutional: Positive for appetite change and fatigue. Negative for chills and fever.  Cardiovascular: Negative for chest pain.  Gastrointestinal: Negative for nausea and vomiting.  Neurological: Positive for weakness (generalized).  Psychiatric/Behavioral: Negative for confusion.  All other systems reviewed  and are negative.    Physical Exam Updated Vital Signs BP 119/97 (BP Location: Right Arm)   Pulse 106   Temp 98.4 F (36.9 C) (Oral)   Resp 15   Ht 6' (1.829 m)   Wt 145 lb (65.8 kg)   SpO2 100%   BMI 19.67 kg/m   Physical Exam  Constitutional: He is oriented to person, place, and time. He appears well-developed and well-nourished.  HENT:  Head: Normocephalic and atraumatic.  Eyes: EOM are normal.  Neck: Normal range of motion.  Cardiovascular: Normal rate, regular rhythm, normal heart sounds and intact distal pulses.   Pulmonary/Chest: Effort normal and breath sounds normal. No respiratory distress.  Abdominal: Soft. He exhibits no distension. There is no tenderness.  Musculoskeletal: Normal range of motion.  Neurological: He is alert and oriented to person, place, and time.  Skin: Skin is warm and dry.  Psychiatric: He has a normal mood and affect. Judgment normal.  Nursing note and vitals reviewed.    ED Treatments / Results   DIAGNOSTIC STUDIES: Oxygen Saturation is 100% on RA, Normal by my interpretation.    COORDINATION OF CARE: 11:15 PM- Will order blood work, urinalysis, and EKG. Will give fluids. Discussed treatment plan with pt at bedside and pt agreed to plan.     11:22 PM- Pt will be admitted to the hospital for a blood transfusion.  Labs (all labs ordered are listed, but only abnormal results are displayed) Labs Reviewed  BASIC METABOLIC PANEL - Abnormal; Notable for the following:       Result Value   Glucose, Bld 126 (*)    BUN 21 (*)    All other components within normal limits  CBC - Abnormal; Notable for the following:    RBC 2.03 (*)    Hemoglobin 5.1 (*)    HCT 16.7 (*)    MCH 25.1 (*)    RDW 22.5 (*)    All other components within normal limits  URINALYSIS, ROUTINE W REFLEX MICROSCOPIC (NOT AT [facility]) - Abnormal; Notable for the following:    Bilirubin Urine SMALL (*)    All other components within normal limits  RETICULOCYTES -  Abnormal; Notable for the following:    Retic Ct Pct 3.3 (*)    RBC. 1.88 (*)    All other components within normal limits  PROTIME-INR - Abnormal; Notable for the following:    Prothrombin Time 38.3 (*)    All other components within normal limits  CBG MONITORING, ED - Abnormal; Notable for the following:    Glucose-Capillary 115 (*)    All other components within normal limits  POC OCCULT BLOOD, ED - Abnormal; Notable for the following:    Fecal Occult Bld POSITIVE (*)    All other components within normal limits  TROPONIN I  VITAMIN B12  FOLATE  IRON AND TIBC  FERRITIN  TYPE AND SCREEN  PREPARE RBC (CROSSMATCH)  ABO/RH    EKG  EKG Interpretation  Date/Time:  Thursday November 27 2015 20:31:18 EDT Ventricular Rate:  113 PR Interval:  176 QRS Duration:  92 QT Interval:  326 QTC Calculation: 447 R Axis:   90 Text Interpretation:  Sinus tachycardia Rightward axis Incomplete right bundle branch block Borderline ECG No significant change was found Confirmed by Audreyana Huntsberry  MD, Lennette Bihari ([id number]) on 11/27/2015 11:11:44 PM       Radiology No results found.  Procedures Procedures (including critical care time)    +++++++++++++++++++++++++++++++++++++++++  CRITICAL CARE Performed by: Hoy Morn Total critical care time: 35 minutes Critical care time was exclusive of separately billable procedures and treating other patients. Critical care was necessary to treat or prevent imminent or life-threatening deterioration. Critical care was time spent personally by me on the following activities: development of treatment plan with patient and/or surrogate as well as nursing, discussions with consultants, evaluation of patient's response to treatment, examination of patient, obtaining history from patient or surrogate, ordering and performing treatments and interventions, ordering and review of laboratory studies, ordering and review of radiographic studies, pulse oximetry and  re-evaluation of patient's condition.   ++++++++++++++++++++++++++++++++++++++++++++    Medications Ordered in ED Medications  0.9 %  sodium chloride infusion ( Intravenous New Bag/Given 11/28/15 0006)     Initial Impression / Assessment and Plan / ED Course  I have reviewed the triage vital signs and the nursing notes.  Pertinent labs & imaging results that were available during my care of the patient were reviewed by me and considered in my medical decision making (see chart for details).  Clinical Course    INR 3.79 today.  2 days ago is 5.8.  He presents with symptoms consistent with symptomatic anemia and hemoglobin of 5.1.  He will require blood transfusion at this time.  Admission the hospital.  Melena on examination.  No vomiting of blood.  Hemodynamically stable.  I suspect he had nonspecific gastrointestinal bleeding when his INR was supratherapeutic.  I will speak with the hospitalist about admission and see what their recommendations are in regards to reversal of his Coumadin with a hemoglobin of 5.1 today and an INR of 3.79.  Final Clinical Impressions(s) / ED Diagnoses   Final diagnoses:  Gastrointestinal hemorrhage with melena  Blood loss anemia    New Prescriptions New Prescriptions   No medications on file   I personally performed the services described in this documentation, which was scribed in my presence. The recorded information has been reviewed and is accurate.        [name], MD 11/28/15 [phone]

## 2015-11-28 DIAGNOSIS — I10 Essential (primary) hypertension: Secondary | ICD-10-CM

## 2015-11-28 DIAGNOSIS — I4892 Unspecified atrial flutter: Secondary | ICD-10-CM

## 2015-11-28 DIAGNOSIS — Y95 Nosocomial condition: Secondary | ICD-10-CM | POA: Diagnosis not present

## 2015-11-28 DIAGNOSIS — D509 Iron deficiency anemia, unspecified: Secondary | ICD-10-CM

## 2015-11-28 DIAGNOSIS — Z681 Body mass index (BMI) 19 or less, adult: Secondary | ICD-10-CM | POA: Diagnosis not present

## 2015-11-28 DIAGNOSIS — G40909 Epilepsy, unspecified, not intractable, without status epilepticus: Secondary | ICD-10-CM | POA: Diagnosis not present

## 2015-11-28 DIAGNOSIS — D500 Iron deficiency anemia secondary to blood loss (chronic): Secondary | ICD-10-CM | POA: Diagnosis not present

## 2015-11-28 DIAGNOSIS — K2951 Unspecified chronic gastritis with bleeding: Secondary | ICD-10-CM | POA: Diagnosis present

## 2015-11-28 DIAGNOSIS — J690 Pneumonitis due to inhalation of food and vomit: Secondary | ICD-10-CM | POA: Diagnosis not present

## 2015-11-28 DIAGNOSIS — T604X1A Toxic effect of rodenticides, accidental (unintentional), initial encounter: Secondary | ICD-10-CM | POA: Diagnosis not present

## 2015-11-28 DIAGNOSIS — Z8673 Personal history of transient ischemic attack (TIA), and cerebral infarction without residual deficits: Secondary | ICD-10-CM | POA: Diagnosis not present

## 2015-11-28 DIAGNOSIS — Z8249 Family history of ischemic heart disease and other diseases of the circulatory system: Secondary | ICD-10-CM | POA: Diagnosis not present

## 2015-11-28 DIAGNOSIS — D62 Acute posthemorrhagic anemia: Secondary | ICD-10-CM | POA: Diagnosis not present

## 2015-11-28 DIAGNOSIS — D6832 Hemorrhagic disorder due to extrinsic circulating anticoagulants: Secondary | ICD-10-CM | POA: Diagnosis present

## 2015-11-28 DIAGNOSIS — G309 Alzheimer's disease, unspecified: Secondary | ICD-10-CM | POA: Diagnosis not present

## 2015-11-28 DIAGNOSIS — K921 Melena: Secondary | ICD-10-CM | POA: Diagnosis not present

## 2015-11-28 DIAGNOSIS — J189 Pneumonia, unspecified organism: Secondary | ICD-10-CM | POA: Diagnosis not present

## 2015-11-28 DIAGNOSIS — Z7901 Long term (current) use of anticoagulants: Secondary | ICD-10-CM | POA: Diagnosis not present

## 2015-11-28 DIAGNOSIS — K449 Diaphragmatic hernia without obstruction or gangrene: Secondary | ICD-10-CM | POA: Diagnosis present

## 2015-11-28 DIAGNOSIS — D649 Anemia, unspecified: Secondary | ICD-10-CM | POA: Diagnosis present

## 2015-11-28 DIAGNOSIS — R918 Other nonspecific abnormal finding of lung field: Secondary | ICD-10-CM | POA: Diagnosis not present

## 2015-11-28 DIAGNOSIS — Z79899 Other long term (current) drug therapy: Secondary | ICD-10-CM | POA: Diagnosis not present

## 2015-11-28 DIAGNOSIS — K922 Gastrointestinal hemorrhage, unspecified: Secondary | ICD-10-CM | POA: Diagnosis not present

## 2015-11-28 DIAGNOSIS — Z87891 Personal history of nicotine dependence: Secondary | ICD-10-CM | POA: Diagnosis not present

## 2015-11-28 DIAGNOSIS — G40209 Localization-related (focal) (partial) symptomatic epilepsy and epileptic syndromes with complex partial seizures, not intractable, without status epilepticus: Secondary | ICD-10-CM | POA: Diagnosis present

## 2015-11-28 DIAGNOSIS — I4891 Unspecified atrial fibrillation: Secondary | ICD-10-CM | POA: Diagnosis present

## 2015-11-28 DIAGNOSIS — F0390 Unspecified dementia without behavioral disturbance: Secondary | ICD-10-CM | POA: Diagnosis present

## 2015-11-28 LAB — PREPARE FRESH FROZEN PLASMA
UNIT DIVISION: 0
Unit division: 0

## 2015-11-28 LAB — URINALYSIS, ROUTINE W REFLEX MICROSCOPIC
GLUCOSE, UA: NEGATIVE mg/dL
HGB URINE DIPSTICK: NEGATIVE
KETONES UR: NEGATIVE mg/dL
Leukocytes, UA: NEGATIVE
Nitrite: NEGATIVE
PH: 5 (ref 5.0–8.0)
PROTEIN: NEGATIVE mg/dL
Specific Gravity, Urine: 1.025 (ref 1.005–1.030)

## 2015-11-28 LAB — HEMOGLOBIN AND HEMATOCRIT, BLOOD
HCT: 21.1 % — ABNORMAL LOW (ref 39.0–52.0)
HCT: 25.7 % — ABNORMAL LOW (ref 39.0–52.0)
Hemoglobin: 6.9 g/dL — CL (ref 13.0–17.0)
Hemoglobin: 8.4 g/dL — ABNORMAL LOW (ref 13.0–17.0)

## 2015-11-28 LAB — RETICULOCYTES
RBC.: 1.88 MIL/uL — ABNORMAL LOW (ref 4.22–5.81)
Retic Count, Absolute: 62 10*3/uL (ref 19.0–186.0)
Retic Ct Pct: 3.3 % — ABNORMAL HIGH (ref 0.4–3.1)

## 2015-11-28 LAB — FOLATE: FOLATE: 11 ng/mL (ref >5.9)

## 2015-11-28 LAB — CBC
HCT: 25.1 % — ABNORMAL LOW (ref 39.0–52.0)
HEMOGLOBIN: 8 g/dL — AB (ref 13.0–17.0)
MCH: 26.5 pg (ref 26.0–34.0)
MCHC: 31.9 g/dL (ref 30.0–36.0)
MCV: 83.1 fL (ref 78.0–100.0)
PLATELETS: 260 10*3/uL (ref 150–400)
RBC: 3.02 MIL/uL — AB (ref 4.22–5.81)
RDW: 17.6 % — ABNORMAL HIGH (ref 11.5–15.5)
WBC: 6.7 10*3/uL (ref 4.0–10.5)

## 2015-11-28 LAB — PROTIME-INR
INR: 3.79
INR: 3.26
INR: 2.07
PROTHROMBIN TIME: 23.6 s — AB (ref 11.4–15.2)
Prothrombin Time: 38.3 seconds — ABNORMAL HIGH (ref 11.4–15.2)
Prothrombin Time: 34 seconds — ABNORMAL HIGH (ref 11.4–15.2)

## 2015-11-28 LAB — POC OCCULT BLOOD, ED: FECAL OCCULT BLD: POSITIVE — AB

## 2015-11-28 LAB — PREPARE RBC (CROSSMATCH)

## 2015-11-28 LAB — TROPONIN I

## 2015-11-28 LAB — IRON AND TIBC
IRON: 8 ug/dL — AB (ref 45–182)
SATURATION RATIOS: 3 % — AB (ref 17.9–39.5)
TIBC: 284 ug/dL (ref 250–450)
UIBC: 276 ug/dL

## 2015-11-28 LAB — ABO/RH: ABO/RH(D): O NEG

## 2015-11-28 LAB — VITAMIN B12: Vitamin B-12: 879 pg/mL (ref 180–914)

## 2015-11-28 LAB — FERRITIN: Ferritin: 17 ng/mL — ABNORMAL LOW (ref 24–336)

## 2015-11-28 MED ORDER — PHYTONADIONE 5 MG PO TABS
5.0000 mg | ORAL_TABLET | Freq: Once | ORAL | Status: AC
  Administered 2015-11-28: 5 mg via ORAL
  Filled 2015-11-28: qty 1

## 2015-11-28 MED ORDER — PANTOPRAZOLE SODIUM 40 MG IV SOLR
40.0000 mg | Freq: Two times a day (BID) | INTRAVENOUS | Status: DC
  Administered 2015-11-28 – 2015-11-30 (×5): 40 mg via INTRAVENOUS
  Filled 2015-11-28 (×5): qty 40

## 2015-11-28 MED ORDER — VITAMIN K1 10 MG/ML IJ SOLN
5.0000 mg | Freq: Once | INTRAVENOUS | Status: AC
  Administered 2015-11-28: 5 mg via INTRAVENOUS
  Filled 2015-11-28: qty 0.5

## 2015-11-28 MED ORDER — LEVETIRACETAM 500 MG PO TABS
1500.0000 mg | ORAL_TABLET | Freq: Two times a day (BID) | ORAL | Status: DC
  Administered 2015-11-28 – 2015-12-02 (×9): 1500 mg via ORAL
  Filled 2015-11-28 (×9): qty 3

## 2015-11-28 MED ORDER — PHENYTOIN SODIUM EXTENDED 100 MG PO CAPS
300.0000 mg | ORAL_CAPSULE | Freq: Every day | ORAL | Status: DC
  Administered 2015-11-28 – 2015-12-02 (×5): 300 mg via ORAL
  Filled 2015-11-28 (×5): qty 3

## 2015-11-28 MED ORDER — PANTOPRAZOLE SODIUM 40 MG PO TBEC: 80.0000 mg | DELAYED_RELEASE_TABLET | Freq: Every day | ORAL | Status: DC

## 2015-11-28 MED ORDER — FUROSEMIDE 10 MG/ML IJ SOLN
20.0000 mg | Freq: Once | INTRAMUSCULAR | Status: AC
  Administered 2015-11-28: 20 mg via INTRAVENOUS
  Filled 2015-11-28: qty 2

## 2015-11-28 NOTE — H&P (Signed)
History and Physical    [name] DOB: Feb 09, 1941 DOA: 11/27/2015   PCP: [name], MD Chief Complaint:  Chief Complaint  Patient presents with  . Weakness  . Fatigue    HPI: [name] is a 75 y.o. male with medical history significant of A.Flutter and stroke, is on chronic coumadin as a result.  Patient presents to the ED with c/o weakness and fatigue, onset insidiously over the past day or so.  On questioning he admits to a 1 week history of melena.  He went as usual to get his INR checked at the coumadin clinic 2 days ago, but didn't tell anyone about the melena at that time as he hadnt yet developed the anemia symptoms.  Symptoms are worse with exertion, onset yesterday at about noon, associated lack of appetite, nothing makes symptoms better, no fever, chills, N/V.  ED Course: INR down to 3.7 from 5.8 2 days ago.  Unfortunately his HGB is also down to 5.1 from what looks like a baseline of 10.  Review of Systems: As per HPI otherwise 10 point review of systems negative.    Past Medical History:  Diagnosis Date  . Hiatal hernia   . HTN (hypertension)    pt denies h/o HTN though it appears he was started on spironolactone during his hospitalization 3/13  . Pancreatitis   . Seizures ([place])   . Stroke ([place])   . Typical atrial flutter ([place]) 3/13    Past Surgical History:  Procedure Laterality Date  . no surgical history  06/2011     reports that he quit smoking about 49 years ago. He has never used smokeless tobacco. He reports that he does not drink alcohol or use drugs.  No Known Allergies  Family History  Problem Relation Age of Onset  . Hypertension        Prior to Admission medications   Medication Sig Start Date End Date Taking? Authorizing Provider  ferrous sulfate 325 (65 FE) MG tablet Take 325 mg by mouth daily.   Yes Historical Provider, MD  levETIRAcetam (KEPPRA) 500 MG tablet Take 1 tablet (500 mg total) by mouth  2 (two) times daily. Patient taking differently: Take 1,500 mg by mouth 2 (two) times daily.  08/08/15  Yes [name], MD  phenytoin (DILANTIN) 100 MG ER capsule Take 3 capsules (300 mg total) by mouth daily. 09/08/15  Yes [name], DO  warfarin (COUMADIN) 2.5 MG tablet Take as directed by Coumadin Clinic Patient taking differently: Take 5 mg by mouth daily at 6 PM.  10/14/15  Yes [name], MD    Physical Exam: Vitals:   11/28/15 0200 11/28/15 0208 11/28/15 0215 11/28/15 0230  BP: (!) 103/54 (!) 103/54 101/59 107/57  Pulse:  91  95  Resp: 17 10 16 14   Temp:  97.8 F (36.6 C)    TempSrc:  Oral    SpO2:  100%  95%  Weight:      Height:          Constitutional: NAD, calm, comfortable Eyes: PERRL, lids and conjunctivae normal ENMT: Mucous membranes are moist. Posterior pharynx clear of any exudate or lesions.Normal dentition.  Neck: normal, supple, no masses, no thyromegaly Respiratory: clear to auscultation bilaterally, no wheezing, no crackles. Normal respiratory effort. No accessory muscle use.  Cardiovascular: Regular rate and rhythm, no murmurs / rubs / gallops. No extremity edema. 2+ pedal pulses. No carotid bruits.  Abdomen: no tenderness, no masses palpated. No  hepatosplenomegaly. Bowel sounds positive.  Musculoskeletal: no clubbing / cyanosis. No joint deformity upper and lower extremities. Good ROM, no contractures. Normal muscle tone.  Skin: no rashes, lesions, ulcers. No induration Neurologic: CN 2-12 grossly intact. Sensation intact, DTR normal. Strength 5/5 in all 4.  Psychiatric: Normal judgment and insight. Alert and oriented x 3. Normal mood.    Labs on Admission: I have personally reviewed following labs and imaging studies  CBC:  Recent Labs Lab 11/27/15 2034  WBC 7.5  HGB 5.1*  HCT 16.7*  MCV 82.3  PLT [id number]   Basic Metabolic Panel:  Recent Labs Lab 11/27/15 2034  NA 139  K 4.6  CL 106  CO2 26  GLUCOSE 126*  BUN 21*  CREATININE 0.79    CALCIUM 8.9   GFR: Estimated Creatinine Clearance: 73.1 mL/min (by C-G formula based on SCr of 0.8 mg/dL). Liver Function Tests: No results for input(s): AST, ALT, ALKPHOS, BILITOT, PROT, ALBUMIN in the last 168 hours. No results for input(s): LIPASE, AMYLASE in the last 168 hours. No results for input(s): AMMONIA in the last 168 hours. Coagulation Profile:  Recent Labs Lab 11/26/15 1426 11/27/15 2312  INR 5.8 3.79   Cardiac Enzymes:  Recent Labs Lab 11/27/15 2324  TROPONINI <0.03   BNP (last 3 results) No results for input(s): PROBNP in the last 8760 hours. HbA1C: No results for input(s): HGBA1C in the last 72 hours. CBG:  Recent Labs Lab 11/27/15 2036  GLUCAP 115*   Lipid Profile: No results for input(s): CHOL, HDL, LDLCALC, TRIG, CHOLHDL, LDLDIRECT in the last 72 hours. Thyroid Function Tests: No results for input(s): TSH, T4TOTAL, FREET4, T3FREE, THYROIDAB in the last 72 hours. Anemia Panel:  Recent Labs  11/27/15 2312  VITAMINB12 879  FOLATE 11.0  FERRITIN 17*  TIBC 284  IRON 8*  RETICCTPCT 3.3*   Urine analysis:    Component Value Date/Time   COLORURINE YELLOW 11/27/2015 2030   APPEARANCEUR CLEAR 11/27/2015 2030   LABSPEC 1.025 11/27/2015 2030   PHURINE 5.0 11/27/2015 2030   GLUCOSEU NEGATIVE 11/27/2015 2030   [place] NEGATIVE 11/27/2015 2030   BILIRUBINUR SMALL (A) 11/27/2015 2030   [place] 11/27/2015 2030   PROTEINUR NEGATIVE 11/27/2015 2030   UROBILINOGEN 0.2 06/02/2011 0300   NITRITE NEGATIVE 11/27/2015 2030   LEUKOCYTESUR NEGATIVE 11/27/2015 2030   Sepsis Labs: @LABRCNTIP (procalcitonin:4,lacticidven:4) )No results found for this or any previous visit (from the past 240 hour(s)).   Radiological Exams on Admission: No results found.  EKG: Independently reviewed.  Assessment/Plan Principal Problem:   Gastrointestinal hemorrhage with melena Active Problems:   Seizure disorder (HCC)   Coumadin toxicity    1. UGIB  with melena - 1. Call GI in AM for EGD 2. Getting 3 units PRBC transfused 3. Recheck H/H after transfusion 4. Tele monitor, hemodynamically stable at this point 2. Coumadin toxicity - 1. Vit K got 5mg  PO in ED, will order 5mg  IV additionally for faster effect 2. Repeat INR at 0800 with the H/H at that time 3. If poor improvement in H/H or INR, then give FFP if needed 3. Seizure disorder - continue home meds   DVT prophylaxis: SCDs Code Status: Full Family Communication: Wife at bedside Consults called: None Admission status: Admit to inpatient   Etta Quill DO [facility] Pager [phone] from 7PM-7AM  If 7AM-7PM, please contact the day physician for the patient [email or web address] Password [email or web address]  11/28/2015, 2:56 AM

## 2015-11-28 NOTE — Progress Notes (Signed)
I have seen and assessed patient and agree with Dr Juleen China assessment and plan.patient is a pleasant 75 year old gentleman history of atrial flutter on chronic Coumadin, history of CVA presented to the ED with generalized weakness and fatigue. Patient noted on admission to have an INR of 3.7 and a hemoglobin of 5.1 from a baseline of 10. Patient does describe melanotic stools ongoing for several days prior to admission. Patient currently nothing by mouth. Patient being transfused third unit of packed red blood cells. Continue serial H&H. Follow INR. GI consultation for further evaluation and management.

## 2015-11-28 NOTE — ED Notes (Signed)
MD at bedside. 

## 2015-11-28 NOTE — Consult Note (Signed)
Subjective:   HPI  The patient is a 75 year old male who we are asked to see in regards to melena. He has been experiencing melena for the past week. He is on Coumadin, and was found to have a supratherapeutic INR. He was admitted to the hospital for further evaluation. He was also found to be significantly anemic. He denied hematemesis. Denies abdominal pain.  Review of Systems No chest pain. Feels somewhat short of breath with exertion.  Past Medical History:  Diagnosis Date  . Hiatal hernia   . HTN (hypertension)    pt denies h/o HTN though it appears he was started on spironolactone during his hospitalization 3/13  . Pancreatitis   . Seizures ([place])   . Stroke ([place])   . Typical atrial flutter ([place]) 3/13   Past Surgical History:  Procedure Laterality Date  . no surgical history  06/2011   Social History   Social History  . Marital status: Divorced    Spouse name: N/A  . Number of children: N/A  . Years of education: N/A   Occupational History  . Not on file.   Social History Main Topics  . Smoking status: Former Smoker    Quit date: 07/15/1966  . Smokeless tobacco: Never Used  . Alcohol use No     Comment: Quit greater than 5 years ago  . Drug use: No  . Sexual activity: Not on file   Other Topics Concern  . Not on file   Social History Narrative   Lives alone.  Retired from [employer]   family history is not on file.  Current Facility-Administered Medications:  .  levETIRAcetam (KEPPRA) tablet 1,500 mg, 1,500 mg, Oral, BID, Etta Quill, DO, 1,500 mg at 11/28/15 0939 .  pantoprazole (PROTONIX) injection 40 mg, 40 mg, Intravenous, Q12H, Eugenie Filler, MD, 40 mg at 11/28/15 0939 .  phenytoin (DILANTIN) ER capsule 300 mg, 300 mg, Oral, Daily, Jared M Gardner, DO, 300 mg at 11/28/15 [id number] No Known Allergies   Objective:     BP (!) 94/58   Pulse 82   Temp 98.6 F (37 C) (Oral)   Resp 17   Ht 6' (1.829 m)   Wt 60.1 kg (132 lb 9.6 oz)   SpO2 100%    BMI 17.98 kg/m   He is in no acute distress  Heart regular rhythm  Lungs clear  Abdomen soft and nontender  Laboratory No components found for: D1    Assessment:     Melena  Anemia  Supratherapeutic INR  Multiple other medical problems as mentioned in the past history        Plan:     PPI therapy. Transfuse blood as needed.Marland Kitchen His supratherapeutic INR will be corrected. Coumadin has been held. I think he will need an EGD but I don't think it needs to be done emergently. We will reassess and scheduled EGD electively in the next few days. Continue PPI therapy. Lab Results  Component Value Date   HGB 6.9 (LL) 11/28/2015   HGB 5.1 (LL) 11/27/2015   HGB 9.3 (A) 10/07/2015   HCT 21.1 (L) 11/28/2015   HCT 16.7 (L) 11/27/2015   HCT 30 (A) 10/07/2015   ALKPHOS 138 (H) 09/19/2012   ALKPHOS 104 10/26/2011   ALKPHOS 91 07/15/2011   AST 16 10/07/2015   AST 19 09/19/2012   AST 17 10/26/2011   ALT 10 10/07/2015   ALT 11 09/19/2012   ALT 14 10/26/2011   AMYLASE 271 (H)  03/01/2006   

## 2015-11-28 NOTE — ED Notes (Signed)
Pt's daughter signed consent for blood and placed at bedside

## 2015-11-28 NOTE — Progress Notes (Signed)
Patient received from ED, daughter at bedside.  Oriented to room, skin assessment and tele monitoring completed. Transfusion 2 of 3 started, no reaction at this time. Will continue to monitor, call light within reach

## 2015-11-29 LAB — CBC
HCT: 24.4 % — ABNORMAL LOW (ref 39.0–52.0)
HEMOGLOBIN: 7.8 g/dL — AB (ref 13.0–17.0)
MCH: 26.4 pg (ref 26.0–34.0)
MCHC: 32 g/dL (ref 30.0–36.0)
MCV: 82.7 fL (ref 78.0–100.0)
Platelets: 258 10*3/uL (ref 150–400)
RBC: 2.95 MIL/uL — AB (ref 4.22–5.81)
RDW: 17.8 % — ABNORMAL HIGH (ref 11.5–15.5)
WBC: 5.5 10*3/uL (ref 4.0–10.5)

## 2015-11-29 LAB — HEPARIN LEVEL (UNFRACTIONATED): HEPARIN UNFRACTIONATED: 0.27 [IU]/mL — AB (ref 0.30–0.70)

## 2015-11-29 LAB — BASIC METABOLIC PANEL WITH GFR
Anion gap: 8 (ref 5–15)
BUN: 12 mg/dL (ref 6–20)
CO2: 25 mmol/L (ref 22–32)
Calcium: 8.1 mg/dL — ABNORMAL LOW (ref 8.9–10.3)
Chloride: 106 mmol/L (ref 101–111)
Creatinine, Ser: 0.76 mg/dL (ref 0.61–1.24)
GFR calc Af Amer: >60 mL/min (ref >60)
GFR calc non Af Amer: >60 mL/min (ref >60)
Glucose, Bld: 86 mg/dL (ref 65–99)
Potassium: 3.6 mmol/L (ref 3.5–5.1)
Sodium: 139 mmol/L (ref 135–145)

## 2015-11-29 LAB — PROTIME-INR
INR: 1.45
PROTHROMBIN TIME: 17.8 s — AB (ref 11.4–15.2)

## 2015-11-29 MED ORDER — IRON DEXTRAN 50 MG/ML IJ SOLN
25.0000 mg | Freq: Once | INTRAMUSCULAR | Status: AC
  Administered 2015-11-29: 25 mg via INTRAVENOUS
  Filled 2015-11-29: qty 0.5

## 2015-11-29 MED ORDER — POTASSIUM CHLORIDE CRYS ER 20 MEQ PO TBCR
40.0000 meq | EXTENDED_RELEASE_TABLET | Freq: Once | ORAL | Status: AC
  Administered 2015-11-29: 40 meq via ORAL
  Filled 2015-11-29: qty 2

## 2015-11-29 MED ORDER — HEPARIN (PORCINE) IN NACL 100-0.45 UNIT/ML-% IJ SOLN
1000.0000 [IU]/h | INTRAMUSCULAR | Status: AC
  Administered 2015-11-29: 900 [IU]/h via INTRAVENOUS
  Filled 2015-11-29: qty 250

## 2015-11-29 NOTE — Progress Notes (Addendum)
ANTICOAGULATION CONSULT NOTE   Pharmacy Consult for Heparin without bolus  Indication: atrial fibrillation  No Known Allergies  Patient Measurements: Height: 6' (182.9 cm) Weight: 130 lb 9.6 oz (59.2 kg) IBW/kg (Calculated) : 77.6 Heparin Dosing Weight: 60 kg  Vital Signs: Temp: 98 F (36.7 C) (09/09 1310) Temp Source: Oral (09/09 1310) BP: 96/61 (09/09 1310) Pulse Rate: 87 (09/09 1310)  Labs:  Recent Labs  11/27/15 2034  11/27/15 2324 11/28/15 0748 11/28/15 1558 11/28/15 1847 11/29/15 0301 11/29/15 1541  HGB 5.1*  --   --  6.9* 8.4* 8.0* 7.8*  --   HCT 16.7*  --   --  21.1* 25.7* 25.1* 24.4*  --   PLT 367  --   --   --   --  260 258  --   LABPROT  --   < >  --  34.0* 23.6*  --  17.8*  --   INR  --   < >  --  3.26 2.07  --  1.45  --   HEPARINUNFRC  --   --   --   --   --   --   --  0.27*  CREATININE 0.79  --   --   --   --   --  0.76  --   TROPONINI  --   --  <0.03  --   --   --   --   --   < > = values in this interval not displayed.  Estimated Creatinine Clearance: 65.8 mL/min (by C-G formula based on SCr of 0.8 mg/dL).   Medical History: Past Medical History:  Diagnosis Date  . Hiatal hernia   . HTN (hypertension)    pt denies h/o HTN though it appears he was started on spironolactone during his hospitalization 3/13  . Pancreatitis   . Seizures ([place])   . Stroke ([place])   . Typical atrial flutter ([place]) 3/13    Assessment: [name] is a 75 y.o. male with medical history significant of A.Flutter and stroke, is on chronic coumadin as a result.  PTA coumadin dose 5 mg daily. Patient presents to the ED with c/o weakness fatigue, 1 week hx of melena. INR supratherapeutic at 3.8 on admission, Hgb 5.1 upon admission. Received PO vitamin K and 3 units PRBC on 9/8. INR trended down to 1.45 today.  Hgb improved to 7.8. PLTC wnl.   Initial heparin level 0.27 on heparin infusion at 900 units/hr. Hgb 7.8 < 8 with no reported bleeding. Noted plans for heparin  infusion to stop on 9/10 at 0300 for planned EGD.    Goal of Therapy:  Heparin level 0.3-0.7 units/ml Monitor platelets by anticoagulation protocol: Yes   Plan:  1. Increase heparin infusion slightly to 1000 units/hr 2. Heparin to stop on 9/10 am at 0300 for planned EGD 3. Will follow up anticoagulation plans EGD   Vincenza Hews, PharmD, BCPS 11/29/2015, 5:30 PM Pager: [phone]

## 2015-11-29 NOTE — Progress Notes (Signed)
MEDICATION RELATED CONSULT NOTE - INITIAL   Pharmacy Consult for doing of IV Iron - Infed Indication: Acute blood loss, iron deficiency anemia  No Known Allergies  Patient Measurements: Height: 6' (182.9 cm) Weight: 130 lb 9.6 oz (59.2 kg) IBW/kg (Calculated) : 77.6   Vital Signs: Temp: 98.3 F (36.8 C) (09/09 0508) Temp Source: Oral (09/09 0508) BP: 92/57 (09/09 0508) Pulse Rate: 78 (09/09 0508) Intake/Output from previous day: 09/08 0701 - 09/09 0700 In: 1572 [Blood:1572] Out: 150 [Urine:150] Intake/Output from this shift: No intake/output data recorded.  Labs:  Recent Labs  11/27/15 2034  11/28/15 1558 11/28/15 1847 11/29/15 0301  WBC 7.5  --   --  6.7 5.5  HGB 5.1*  < > 8.4* 8.0* 7.8*  HCT 16.7*  < > 25.7* 25.1* 24.4*  PLT 367  --   --  260 258  CREATININE 0.79  --   --   --  0.76  < > = values in this interval not displayed. Estimated Creatinine Clearance: 65.8 mL/min (by C-G formula based on SCr of 0.8 mg/dL).  Medical History: Past Medical History:  Diagnosis Date  . Hiatal hernia   . HTN (hypertension)    pt denies h/o HTN though it appears he was started on spironolactone during his hospitalization 3/13  . Pancreatitis   . Seizures ([place])   . Stroke ([place])   . Typical atrial flutter ([place]) 3/13    Assessment: Rafer Kapral Foustis a [id number] y.o.malewith medical history significant of A.Flutter and stroke, is on chronic coumadin as a result, seizures, and HTN.  Patient presents to the ED with c/o weakness fatigue, 1 week hx of melena. INR supratherapeutic at 3.8 on admission, Hgb 5.1 upon admission (Baseline 10 per note). Received PO vitamin K and 3 units PRBC on 9/8. INR trended down to 1.45 today. Hgb trend 5.1>6.9>8.4>8.0>7.8.  Iron stores: iron 8, UIBC 276, TIBC, 284, saturation ratio 3, Ferritin 17, Folate 11.0   Calculated iron deficit = 1315 mg  Plan:  Test dose iron dextran of 25 mg F/u patient tolerance of test dose If pt tolerates give iron  dextran 600 mg q24 x 2 for total dose of 1200 mg  Carlean Jews, Pharm.D. PGY1 Pharmacy Resident 9/9/20179:19 AM Pager [phone]

## 2015-11-29 NOTE — Progress Notes (Signed)
ANTICOAGULATION CONSULT NOTE - Initial Consult  Pharmacy Consult for Heparin without bolus  Indication: atrial fibrillation  No Known Allergies  Patient Measurements: Height: 6' (182.9 cm) Weight: 132 lb 9.6 oz (60.1 kg) IBW/kg (Calculated) : 77.6 Heparin Dosing Weight: 60 kg  Vital Signs: Temp: 98.3 F (36.8 C) (09/09 0508) Temp Source: Oral (09/09 0508) BP: 92/57 (09/09 0508) Pulse Rate: 78 (09/09 0508)  Labs:  Recent Labs  11/27/15 2034  11/27/15 2324 11/28/15 0748 11/28/15 1558 11/28/15 1847 11/29/15 0301  HGB 5.1*  --   --  6.9* 8.4* 8.0* 7.8*  HCT 16.7*  --   --  21.1* 25.7* 25.1* 24.4*  PLT 367  --   --   --   --  260 258  LABPROT  --   < >  --  34.0* 23.6*  --  17.8*  INR  --   < >  --  3.26 2.07  --  1.45  CREATININE 0.79  --   --   --   --   --  0.76  TROPONINI  --   --  <0.03  --   --   --   --   < > = values in this interval not displayed.  Estimated Creatinine Clearance: 66.8 mL/min (by C-G formula based on SCr of 0.8 mg/dL).   Medical History: Past Medical History:  Diagnosis Date  . Hiatal hernia   . HTN (hypertension)    pt denies h/o HTN though it appears he was started on spironolactone during his hospitalization 3/13  . Pancreatitis   . Seizures ([place])   . Stroke ([place])   . Typical atrial flutter ([place]) 3/13    Assessment: [name] is a 75 y.o. male with medical history significant of A.Flutter and stroke, is on chronic coumadin as a result.  PTA coumadin dose 5 mg daily. Patient presents to the ED with c/o weakness fatigue, 1 week hx of melena. INR supratherapeutic at 3.8 on admission, Hgb 5.1 upon admission. Received PO vitamin K and 3 units PRBC on 9/8. INR trended down to 1.45 today.  Hgb improved to 7.8. PLTC wnl.   Pt is scheduled for EGD tomorrow. Per heparin consult: "No bolus. Patient for EGD tommorrow. Hold heparin at 3AM on morning of 11/30/2015"  Of note, pt also on phenytoin.    Goal of Therapy:  Heparin level  0.3-0.7 units/ml Monitor platelets by anticoagulation protocol: Yes   Plan:  Start heparin infusion at 900 units/hr 6hr heparin level STOP HEPARIN AT 0300 ON 9/10 FOR EGD PROCEDURE.  Daily heparin level, CBC while on heparin Monitor for s/sx of bleeding, H/H, PLTC F/u restart warfarin outpatient   Carlean Jews, Pharm.D. PGY1 Pharmacy Resident 9/9/20178:53 AM Pager [phone]

## 2015-11-29 NOTE — Progress Notes (Signed)
[facility] Progress Note  Subjective: No new complaints today. Discussed case with Dr. Grandville Silos. Patient's Coumadin has been held and he will be started on a heparin drip. No signs of further bleeding at this time. We will plan EGD tomorrow to evaluate his melena and anemia.  Objective: Vital signs in last 24 hours: Temp:  [97.9 F (36.6 C)-98.7 F (37.1 C)] 98.3 F (36.8 C) (09/09 0508) Pulse Rate:  [73-101] 73 (09/09 1012) Resp:  [17-18] 18 (09/09 0508) BP: (92-123)/(53-63) 99/60 (09/09 1012) SpO2:  [98 %-100 %] 98 % (09/09 0508) Weight:  [59.2 kg (130 lb 9.6 oz)] 59.2 kg (130 lb 9.6 oz) (09/09 0800) Weight change:    PE:  No distress  Heart regular rhythm  Lungs clear  Abdomen soft and nontender  Lab Results: Results for orders placed or performed during the hospital encounter of 11/27/15 (from the past 24 hour(s))  Hemoglobin and hematocrit, blood     Status: Abnormal   Collection Time: 11/28/15  3:58 PM  Result Value Ref Range   Hemoglobin 8.4 (L) 13.0 - 17.0 g/dL   HCT 25.7 (L) 39.0 - 52.0 %  Protime-INR     Status: Abnormal   Collection Time: 11/28/15  3:58 PM  Result Value Ref Range   Prothrombin Time 23.6 (H) 11.4 - 15.2 seconds   INR 2.07   CBC     Status: Abnormal   Collection Time: 11/28/15  6:47 PM  Result Value Ref Range   WBC 6.7 4.0 - 10.5 K/uL   RBC 3.02 (L) 4.22 - 5.81 MIL/uL   Hemoglobin 8.0 (L) 13.0 - 17.0 g/dL   HCT 25.1 (L) 39.0 - 52.0 %   MCV 83.1 78.0 - 100.0 fL   MCH 26.5 26.0 - 34.0 pg   MCHC 31.9 30.0 - 36.0 g/dL   RDW 17.6 (H) 11.5 - 15.5 %   Platelets 260 150 - 400 K/uL  CBC     Status: Abnormal   Collection Time: 11/29/15  3:01 AM  Result Value Ref Range   WBC 5.5 4.0 - 10.5 K/uL   RBC 2.95 (L) 4.22 - 5.81 MIL/uL   Hemoglobin 7.8 (L) 13.0 - 17.0 g/dL   HCT 24.4 (L) 39.0 - 52.0 %   MCV 82.7 78.0 - 100.0 fL   MCH 26.4 26.0 - 34.0 pg   MCHC 32.0 30.0 - 36.0 g/dL   RDW 17.8 (H) 11.5 - 15.5 %   Platelets 258 150 -  400 K/uL  Basic metabolic panel     Status: Abnormal   Collection Time: 11/29/15  3:01 AM  Result Value Ref Range   Sodium 139 135 - 145 mmol/L   Potassium 3.6 3.5 - 5.1 mmol/L   Chloride 106 101 - 111 mmol/L   CO2 25 22 - 32 mmol/L   Glucose, Bld 86 65 - 99 mg/dL   BUN 12 6 - 20 mg/dL   Creatinine, Ser 0.76 0.61 - 1.24 mg/dL   Calcium 8.1 (L) 8.9 - 10.3 mg/dL   GFR calc non Af Amer >60 >60 mL/min   GFR calc Af Amer >60 >60 mL/min   Anion gap 8 5 - 15  Protime-INR     Status: Abnormal   Collection Time: 11/29/15  3:01 AM  Result Value Ref Range   Prothrombin Time 17.8 (H) 11.4 - 15.2 seconds   INR 1.45     Studies/Results: No results found.    Assessment: Melena and anemia    Plan:  Plan EGD tomorrow. His Coumadin has been held and he is on a heparin drip. This will be held for several hours prior to his endoscopy tomorrow.    Cassell Clement 11/29/2015, 10:41 AM  Pager: [phone] If no answer or after 5 PM call [phone] Lab Results  Component Value Date   HGB 7.8 (L) 11/29/2015   HGB 8.0 (L) 11/28/2015   HGB 8.4 (L) 11/28/2015   HCT 24.4 (L) 11/29/2015   HCT 25.1 (L) 11/28/2015   HCT 25.7 (L) 11/28/2015   ALKPHOS 138 (H) 09/19/2012   ALKPHOS 104 10/26/2011   ALKPHOS 91 07/15/2011   AST 16 10/07/2015   AST 19 09/19/2012   AST 17 10/26/2011   ALT 10 10/07/2015   ALT 11 09/19/2012   ALT 14 10/26/2011   AMYLASE 271 (H) 03/01/2006

## 2015-11-29 NOTE — Progress Notes (Signed)
PROGRESS NOTE    [name]  [id number] DOB: [birth year] DOA: 11/27/2015 PCP: [name], MD    Brief Narrative:   [name] is a 75 y.o. male with medical history significant of A.Flutter and stroke, is on chronic coumadin as a result.  Patient presents to the ED with c/o weakness and fatigue, onset insidiously over the past day or so.  On questioning he admits to a 1 week history of melena.  He went as usual to get his INR checked at the coumadin clinic 2 days ago, but didn't tell anyone about the melena at that time as he hadnt yet developed the anemia symptoms.  Symptoms are worse with exertion, onset yesterday at about noon, associated lack of appetite, nothing makes symptoms better, no fever, chills, N/V.  ED Course: INR down to 3.7 from 5.8 2 days ago.  Unfortunately his HGB is also down to 5.1 from what looks like a baseline of 10.    Assessment & Plan:   Principal Problem:   Gastrointestinal hemorrhage with melena Active Problems:   Acute blood loss anemia   Essential hypertension   Seizure disorder (HCC)   Atrial flutter (HCC)   CVA (cerebral infarction)   Long term (current) use of anticoagulants   Dementia (AD)   Coumadin toxicity   Anemia, iron deficiency   Blood loss anemia  #1 GI bleed with melena Questionable etiology. Concern for upper GI bleed. Patient status post 3 units packed red blood cells 11/28/2015. Hemoglobin currently at 7.8 from 5.1 on admission. Patient's anticoagulation has been reversed and INR now at 1.45. Patient has been placed on a heparin bridge as patient with history of A. fib and acute stroke. Patient currently on clear liquid diet. Anemia panel consistent with iron deficiency anemia. Continue Protonix IV every 12 hours. Patient to receive IV iron. IV heparin to be held prior to EGD tomorrow. Patient for upper endoscopy tomorrow. GI following and appreciate input and recommendations.  #2 acute blood loss  anemia/iron deficiency anemia Secondary to problem #1. Patient for upper endoscopy tomorrow further evaluation. Gastroenterology. Status post 3 units packed red blood cells. Hemoglobin currently at 7.8 from 5.1 on admission. Anemia panel with iron level of 8 and a ferritin of 17. Patient to receive IV iron. Patient will likely need oral iron supplementation on discharge. GI following.  #3 A. fib Currently rate controlled. Patient signout was reversed in preparation for upper endoscopy to be done tomorrow. Patient will be bridged with IV heparin which will be held just prior to procedure tomorrow. GI to advice when Coumadin may be resumed.  #4 history of CVA Was on Coumadin for secondary stroke prevention. INR has been reversed. Patient currently on IV heparin bridge. GI will advise when Coumadin may be resumed post evaluation for GI bleed.  #5 history of seizure disorder Stable. No seizures noted. Continue home regimen of Keppra and Dilantin.  #6 hypertension Stable. Blood pressure borderline.  #7 history of dementia Stable.   DVT prophylaxis: IV heparin/SCDs Code Status: Full Family Communication: Updated patient. No family present. Disposition Plan: Home once GI workup is complete.   Consultants:   Gastroenterology: Larwance Rote 11/28/2015  Procedures:   3 units packed red blood cells 11/28/2015   Antimicrobials:   None   Subjective: Patient states had a bowel movement however did not look at it and not sure whether it was melanotic or not. No chest pain. No shortness of breath.  Objective: Vitals:   11/28/15 2115  11/29/15 0508 11/29/15 0800 11/29/15 1012  BP: (!) 98/57 (!) 92/57  99/60  Pulse: 81 78  73  Resp: 18 18    Temp: 97.9 F (36.6 C) 98.3 F (36.8 C)    TempSrc: Oral Oral    SpO2: 100% 98%    Weight:   59.2 kg (130 lb 9.6 oz)   Height:        Intake/Output Summary (Last 24 hours) at 11/29/15 1140 Last data filed at 11/29/15 0900  Gross per 24 hour    Intake             1782 ml  Output              150 ml  Net             1632 ml   Filed Weights   11/27/15 2027 11/28/15 0407 11/29/15 0800  Weight: 65.8 kg (145 lb) 60.1 kg (132 lb 9.6 oz) 59.2 kg (130 lb 9.6 oz)    Examination:  General exam: Appears calm and comfortable  Respiratory system: Clear to auscultation. Respiratory effort normal. Cardiovascular system: S1 & S2 heard, RRR. No JVD, murmurs, rubs, gallops or clicks. No pedal edema. Gastrointestinal system: Abdomen is nondistended, soft and nontender. No organomegaly or masses felt. Normal bowel sounds heard. Central nervous system: Alert and oriented. No focal neurological deficits. Extremities: Symmetric 5 x 5 power. Skin: No rashes, lesions or ulcers Psychiatry: Judgement and insight appear normal. Mood & affect appropriate.     Data Reviewed: I have personally reviewed following labs and imaging studies  CBC:  Recent Labs Lab 11/27/15 2034 11/28/15 0748 11/28/15 1558 11/28/15 1847 11/29/15 0301  WBC 7.5  --   --  6.7 5.5  HGB 5.1* 6.9* 8.4* 8.0* 7.8*  HCT 16.7* 21.1* 25.7* 25.1* 24.4*  MCV 82.3  --   --  83.1 82.7  PLT 367  --   --  260 0000000   Basic Metabolic Panel:  Recent Labs Lab 11/27/15 2034 11/29/15 0301  NA 139 139  K 4.6 3.6  CL 106 106  CO2 26 25  GLUCOSE 126* 86  BUN 21* 12  CREATININE 0.79 0.76  CALCIUM 8.9 8.1*   GFR: Estimated Creatinine Clearance: 65.8 mL/min (by C-G formula based on SCr of 0.8 mg/dL). Liver Function Tests: No results for input(s): AST, ALT, ALKPHOS, BILITOT, PROT, ALBUMIN in the last 168 hours. No results for input(s): LIPASE, AMYLASE in the last 168 hours. No results for input(s): AMMONIA in the last 168 hours. Coagulation Profile:  Recent Labs Lab 11/26/15 1426 11/27/15 2312 11/28/15 0748 11/28/15 1558 11/29/15 0301  INR 5.8 3.79 3.26 2.07 1.45   Cardiac Enzymes:  Recent Labs Lab 11/27/15 2324  TROPONINI <0.03   BNP (last 3 results) No  results for input(s): PROBNP in the last 8760 hours. HbA1C: No results for input(s): HGBA1C in the last 72 hours. CBG:  Recent Labs Lab 11/27/15 2036  GLUCAP 115*   Lipid Profile: No results for input(s): CHOL, HDL, LDLCALC, TRIG, CHOLHDL, LDLDIRECT in the last 72 hours. Thyroid Function Tests: No results for input(s): TSH, T4TOTAL, FREET4, T3FREE, THYROIDAB in the last 72 hours. Anemia Panel:  Recent Labs  11/27/15 2312  VITAMINB12 879  FOLATE 11.0  FERRITIN 17*  TIBC 284  IRON 8*  RETICCTPCT 3.3*   Sepsis Labs: No results for input(s): PROCALCITON, LATICACIDVEN in the last 168 hours.  No results found for this or any previous visit (from the past 240  hour(s)).       Radiology Studies: No results found.      Scheduled Meds: . levETIRAcetam  1,500 mg Oral BID  . pantoprazole (PROTONIX) IV  40 mg Intravenous Q12H  . phenytoin  300 mg Oral Daily   Continuous Infusions: . heparin 900 Units/hr (11/29/15 1043)     LOS: 1 day    Time spent: 46 minutes    Anh Mangano, MD [facility] Pager [phone]  If 7PM-7AM, please contact night-coverage [email or web address] Password [email or web address] 11/29/2015, 11:40 AM

## 2015-11-30 ENCOUNTER — Encounter (HOSPITAL_COMMUNITY)
Admission: EM | Disposition: A | Discharge status: Home Health | Payer: Self-pay | Source: Home / Self Care | Attending: Internal Medicine

## 2015-11-30 ENCOUNTER — Encounter (HOSPITAL_COMMUNITY): Payer: Self-pay | Admitting: *Deleted

## 2015-11-30 DIAGNOSIS — K3189 Other diseases of stomach and duodenum: Secondary | ICD-10-CM

## 2015-11-30 HISTORY — PX: ESOPHAGOGASTRODUODENOSCOPY: SHX5428

## 2015-11-30 LAB — CBC
HCT: 23.3 % — ABNORMAL LOW (ref 39.0–52.0)
Hemoglobin: 7.3 g/dL — ABNORMAL LOW (ref 13.0–17.0)
MCH: 26.4 pg (ref 26.0–34.0)
MCHC: 31.3 g/dL (ref 30.0–36.0)
MCV: 84.4 fL (ref 78.0–100.0)
Platelets: 247 10*3/uL (ref 150–400)
RBC: 2.76 MIL/uL — ABNORMAL LOW (ref 4.22–5.81)
RDW: 18.1 % — ABNORMAL HIGH (ref 11.5–15.5)
WBC: 5.4 10*3/uL (ref 4.0–10.5)

## 2015-11-30 LAB — BASIC METABOLIC PANEL
Anion gap: 4 — ABNORMAL LOW (ref 5–15)
BUN: 8 mg/dL (ref 6–20)
CO2: 26 mmol/L (ref 22–32)
Calcium: 7.8 mg/dL — ABNORMAL LOW (ref 8.9–10.3)
Chloride: 107 mmol/L (ref 101–111)
Creatinine, Ser: 0.69 mg/dL (ref 0.61–1.24)
GFR calc Af Amer: >60 mL/min (ref >60)
GFR calc non Af Amer: >60 mL/min (ref >60)
Glucose, Bld: 88 mg/dL (ref 65–99)
Potassium: 3.8 mmol/L (ref 3.5–5.1)
Sodium: 137 mmol/L (ref 135–145)

## 2015-11-30 LAB — HEPARIN LEVEL (UNFRACTIONATED): Heparin Unfractionated: 0.63 IU/mL (ref 0.30–0.70)

## 2015-11-30 LAB — PROTIME-INR
INR: 1.42
Prothrombin Time: 17.5 seconds — ABNORMAL HIGH (ref 11.4–15.2)

## 2015-11-30 LAB — PREPARE RBC (CROSSMATCH)

## 2015-11-30 SURGERY — EGD (ESOPHAGOGASTRODUODENOSCOPY)
Anesthesia: Moderate Sedation

## 2015-11-30 MED ORDER — SODIUM CHLORIDE 0.9 % IV SOLN
Freq: Once | INTRAVENOUS | Status: AC
  Administered 2015-11-30: 10:00:00 via INTRAVENOUS

## 2015-11-30 MED ORDER — ACETAMINOPHEN 325 MG PO TABS
650.0000 mg | ORAL_TABLET | Freq: Once | ORAL | Status: AC
  Administered 2015-11-30: 650 mg via ORAL
  Filled 2015-11-30: qty 2

## 2015-11-30 MED ORDER — DIPHENHYDRAMINE HCL 25 MG PO CAPS
25.0000 mg | ORAL_CAPSULE | Freq: Once | ORAL | Status: AC
  Administered 2015-11-30: 25 mg via ORAL
  Filled 2015-11-30: qty 1

## 2015-11-30 MED ORDER — MIDAZOLAM HCL 10 MG/2ML IJ SOLN
INTRAMUSCULAR | Status: DC | PRN
  Administered 2015-11-30: 2 mg via INTRAVENOUS

## 2015-11-30 MED ORDER — WARFARIN - PHARMACIST DOSING INPATIENT
Freq: Every day | Status: DC
  Administered 2015-12-01: 18:00:00

## 2015-11-30 MED ORDER — FENTANYL CITRATE (PF) 100 MCG/2ML IJ SOLN
INTRAMUSCULAR | Status: DC | PRN
  Administered 2015-11-30: 25 ug via INTRAVENOUS

## 2015-11-30 MED ORDER — FENTANYL CITRATE (PF) 100 MCG/2ML IJ SOLN
INTRAMUSCULAR | Status: AC
  Filled 2015-11-30: qty 2

## 2015-11-30 MED ORDER — BUTAMBEN-TETRACAINE-BENZOCAINE 2-2-14 % EX AERO
INHALATION_SPRAY | CUTANEOUS | Status: DC | PRN
  Administered 2015-11-30: 2 via TOPICAL

## 2015-11-30 MED ORDER — SODIUM CHLORIDE 0.9 % IV SOLN: INTRAVENOUS | Status: DC

## 2015-11-30 MED ORDER — PANTOPRAZOLE SODIUM 40 MG PO TBEC
40.0000 mg | DELAYED_RELEASE_TABLET | Freq: Two times a day (BID) | ORAL | Status: DC
  Administered 2015-11-30 – 2015-12-02 (×4): 40 mg via ORAL
  Filled 2015-11-30 (×4): qty 1

## 2015-11-30 MED ORDER — SODIUM CHLORIDE 0.9 % IV SOLN
1200.0000 mg | Freq: Once | INTRAVENOUS | Status: AC
  Administered 2015-12-01: 1200 mg via INTRAVENOUS
  Filled 2015-11-30 (×2): qty 24

## 2015-11-30 MED ORDER — WARFARIN SODIUM 5 MG PO TABS
5.0000 mg | ORAL_TABLET | Freq: Once | ORAL | Status: AC
  Administered 2015-11-30: 5 mg via ORAL
  Filled 2015-11-30: qty 1

## 2015-11-30 MED ORDER — MIDAZOLAM HCL 5 MG/ML IJ SOLN
INTRAMUSCULAR | Status: AC
  Filled 2015-11-30: qty 2

## 2015-11-30 MED ORDER — SODIUM CHLORIDE 0.9 % IV SOLN
1200.0000 mg | Freq: Once | INTRAVENOUS | Status: DC
  Filled 2015-11-30: qty 24

## 2015-11-30 NOTE — Progress Notes (Addendum)
Oral temp 100.14F, MD Cline Cools paged. A one time order for tylenol 650mg  was received, will continue to monitor

## 2015-11-30 NOTE — Op Note (Signed)
[facility] Patient Name: [name] Procedure Date : 11/30/2015 MRN: [id number] Attending MD: Wonda Horner , MD Date of Birth: [birth year] CSN: [id number] Age: 75 Admit Type: Inpatient Procedure:                Upper GI endoscopy Indications:              Melena Providers:                Wonda Horner, MD, Carolynn Comment, RN, William Dalton, Technician Referring MD:              Medicines:                Fentanyl 25 micrograms IV, Midazolam 2 mg IV Complications:            No immediate complications. Estimated Blood Loss:     Estimated blood loss: none. Procedure:                Pre-Anesthesia Assessment:                           - Prior to the procedure, a History and Physical                            was performed, and patient medications and                            allergies were reviewed. The patient's tolerance of                            previous anesthesia was also reviewed. The risks                            and benefits of the procedure and the sedation                            options and risks were discussed with the patient.                            All questions were answered, and informed consent                            was obtained. Prior Anticoagulants: The patient has                            taken Coumadin (warfarin). ASA Grade Assessment:                            III - A patient with severe systemic disease. After                            reviewing the risks and benefits, the patient was  deemed in satisfactory condition to undergo the                            procedure.                           After obtaining informed consent, the endoscope was                            passed under direct vision. Throughout the                            procedure, the patient's blood pressure, pulse, and                            oxygen saturations were monitored continuously.  The                            [id number]) scope was introduced through the                            mouth, and advanced to the second part of duodenum.                            The upper GI endoscopy was accomplished without                            difficulty. The patient tolerated the procedure                            well. Scope In: Scope Out: Findings:      The examined esophagus was normal.      A few erosions were found in the gastric antrum.      The examined duodenum was normal. Impression:               - Normal esophagus.                           - Erosive gastropathy.                           - Normal examined duodenum.                           - No specimens collected. Moderate Sedation:      . Recommendation:           - Resume regular diet.                           - Continue present medications.                           - I think he needs PPI treatment for the errosive                            antral gastritis. I think  Coumadin can be resumed.                            We will sign off. Call us if needed. Procedure Code(s):        --- Professional ---                           [phone], Esophagogastroduodenoscopy, flexible,                            transoral; diagnostic, including collection of                            specimen(s) by brushing or washing, when performed                            (separate procedure) Diagnosis Code(s):        --- Professional ---                           K31.89, Other diseases of stomach and duodenum                           K92.1, Melena (includes Hematochezia) CPT copyright 2016 [employer] Medical Association. All rights reserved. The codes documented in this report are preliminary and upon coder review may  be revised to meet current compliance requirements. Wonda Horner, MD 11/30/2015 9:01:55 AM This report has been signed electronically. Number of Addenda: 0

## 2015-11-30 NOTE — Progress Notes (Signed)
ANTICOAGULATION CONSULT NOTE - Initial Consult  Pharmacy Consult for warfarin  Indication: atrial fibrillation  No Known Allergies  Patient Measurements: Height: 6' (182.9 cm) Weight: 130 lb (59 kg) IBW/kg (Calculated) : 77.6  Vital Signs: Temp: 97.4 F (36.3 C) (09/10 1100) Temp Source: Axillary (09/10 1100) BP: 99/55 (09/10 1100) Pulse Rate: 84 (09/10 1100)  Labs:  Recent Labs  11/27/15 2034  11/27/15 2324  11/28/15 1558 11/28/15 1847 11/29/15 0301 11/29/15 1541 11/30/15 0300  HGB 5.1*  --   --   < > 8.4* 8.0* 7.8*  --  7.3*  HCT 16.7*  --   --   < > 25.7* 25.1* 24.4*  --  23.3*  PLT 367  --   --   --   --  260 258  --  247  LABPROT  --   < >  --   < > 23.6*  --  17.8*  --  17.5*  INR  --   < >  --   < > 2.07  --  1.45  --  1.42  HEPARINUNFRC  --   --   --   --   --   --   --  0.27* 0.63  CREATININE 0.79  --   --   --   --   --  0.76  --  0.69  TROPONINI  --   --  <0.03  --   --   --   --   --   --   < > = values in this interval not displayed.  Estimated Creatinine Clearance: 65.6 mL/min (by C-G formula based on SCr of 0.8 mg/dL).   Medical History: Past Medical History:  Diagnosis Date  . Hiatal hernia   . HTN (hypertension)    pt denies h/o HTN though it appears he was started on spironolactone during his hospitalization 3/13  . Pancreatitis   . Seizures ([place])   . Stroke ([place])   . Typical atrial flutter ([place]) 3/13   Assessment: [name] a [id number] y.o.malewith medical history significant of A.Flutter and stroke, is on chronic coumadin as a result. PTA coumadin dose 5 mg daily. Patient presents to the ED with c/o weakness fatigue, 1 week hx of melena. INR supratherapeutic at 3.8 on admission, Hgb 5.1 upon admission. Received PO vitamin K and 3 units PRBC on 9/8.  Heparin stopped 9/10 am prior to EGD.   S/p EGD this morning that found errosive antral gastritis.  Pt is to remain on PPI and to resume anticoagulation with warfarin tonight, heparin  has been discontinued. INR 1.42, Hgb 7.3, PLTs 247.   PTA dose: 5 mg daily   Goal of Therapy:  INR 2-3 Monitor platelets by anticoagulation protocol: Yes   Plan:  1. Warfarin 5 mg x 1 tonight 2. Daily INR 3. PPI to PO with regular diet 4. Complete Fe treatment today  Vincenza Hews, PharmD, BCPS 11/30/2015, 11:57 AM Pager: [phone]

## 2015-11-30 NOTE — Progress Notes (Signed)
PROGRESS NOTE    [name]  [id number] DOB: [birth year] DOA: 11/27/2015 PCP: [name], MD    Brief Narrative:   [name] is a 75 y.o. male with medical history significant of A.Flutter and stroke, is on chronic coumadin as a result.  Patient presents to the ED with c/o weakness and fatigue, onset insidiously over the past day or so.  On questioning he admits to a 1 week history of melena.  He went as usual to get his INR checked at the coumadin clinic 2 days ago, but didn't tell anyone about the melena at that time as he hadnt yet developed the anemia symptoms.  Symptoms are worse with exertion, onset yesterday at about noon, associated lack of appetite, nothing makes symptoms better, no fever, chills, N/V.  ED Course: INR down to 3.7 from 5.8 2 days ago.  Unfortunately his HGB is also down to 5.1 from what looks like a baseline of 10.    Assessment & Plan:   Principal Problem:   Gastrointestinal hemorrhage with melena Active Problems:   Erosive gastropathy: Per EGD 11/30/2015   Acute blood loss anemia   Essential hypertension   Seizure disorder (HCC)   Atrial flutter (HCC)   CVA (cerebral infarction)   Long term (current) use of anticoagulants   Dementia (AD)   Coumadin toxicity   Anemia, iron deficiency   Blood loss anemia  #1 GI bleed with melena/erosive gastropathy Likely secondary to erosive gastropathy per EGD 11/30/2015.  Patient status post 3 units packed red blood cells 11/28/2015. Hemoglobin currently at 7.3 from 5.1 on admission. Patient's anticoagulation has been reversed and INR now at 1.42. Patient was placed on a heparin bridge as patient with history of A. fib and acute stroke. Anemia panel consistent with iron deficiency anemia. Continue Protonix every 12 hours. Patient s/p IV iron. Transfuse 2 units PRBCs. GI following and appreciate input and recommendations.  #2 acute blood loss anemia/iron deficiency anemia Secondary to  problem #1. Patient s/p upper endoscopy today, with erosive gastropathy. Status post 3 units packed red blood cells. Hemoglobin currently at 7.3 from 5.1 on admission. Anemia panel with iron level of 8 and a ferritin of 17. Patient s/p IV iron. Patient will likely need oral iron supplementation on discharge. Transfuse 2 units PRBCs.  #3 A. fib Currently rate controlled. Patient signout was reversed in preparation for upper endoscopy today. Patient will be bridged with IV heparin which will be held just prior to procedure tomorrow. Per GI may resume coumadin today.   #4 history of CVA Was on Coumadin for secondary stroke prevention. INR has been reversed. D/C  IV heparin bridge. Resume coumadin per GI.  #5 history of seizure disorder Stable. No seizures noted. Continue home regimen of Keppra and Dilantin.  #6 hypertension Stable. Blood pressure borderline.  #7 history of dementia Stable.   DVT prophylaxis: SCDs Code Status: Full Family Communication: Updated patient and daughter at bedside. Disposition Plan: Home once GI workup is complete, and Hgb stable.    Consultants:   Gastroenterology: Larwance Rote 11/28/2015  Procedures:   3 units packed red blood cells 11/28/2015   2 units PRBCs 11/30/2015  EGD-Erosive gastropathy. Dr [name] 11/30/2015  Antimicrobials:   None   Subjective: No chest pain. No shortness of breath.  Objective: Vitals:   11/30/15 0905 11/30/15 0910 11/30/15 0912 11/30/15 1034  BP: (!) 82/53 91/68 (!) 91/41 102/61  Pulse: 80 82 81 94  Resp: (!) 24 19 20  20  Temp:    97.5 F (36.4 C)  TempSrc:    Axillary  SpO2: 98% 99% 99% 98%  Weight:      Height:        Intake/Output Summary (Last 24 hours) at 11/30/15 1100 Last data filed at 11/30/15 0029  Gross per 24 hour  Intake              240 ml  Output              150 ml  Net               90 ml   Filed Weights   11/28/15 0407 11/29/15 0800 11/30/15 0827  Weight: 60.1 kg (132 lb 9.6 oz) 59.2 kg  (130 lb 9.6 oz) 59 kg (130 lb)    Examination:  General exam: Appears calm and comfortable  Respiratory system: Clear to auscultation. Respiratory effort normal. Cardiovascular system: S1 & S2 heard, RRR. No JVD, murmurs, rubs, gallops or clicks. No pedal edema. Gastrointestinal system: Abdomen is nondistended, soft and nontender. No organomegaly or masses felt. Normal bowel sounds heard. Central nervous system: Alert and oriented. No focal neurological deficits. Extremities: Symmetric 5 x 5 power. Skin: No rashes, lesions or ulcers Psychiatry: Judgement and insight appear normal. Mood & affect appropriate.     Data Reviewed: I have personally reviewed following labs and imaging studies  CBC:  Recent Labs Lab 11/27/15 2034 11/28/15 0748 11/28/15 1558 11/28/15 1847 11/29/15 0301 11/30/15 0300  WBC 7.5  --   --  6.7 5.5 5.4  HGB 5.1* 6.9* 8.4* 8.0* 7.8* 7.3*  HCT 16.7* 21.1* 25.7* 25.1* 24.4* 23.3*  MCV 82.3  --   --  83.1 82.7 84.4  PLT 367  --   --  260 258 [id number]   Basic Metabolic Panel:  Recent Labs Lab 11/27/15 2034 11/29/15 0301 11/30/15 0300  NA 139 139 137  K 4.6 3.6 3.8  CL 106 106 107  CO2 26 25 26   GLUCOSE 126* 86 88  BUN 21* 12 8  CREATININE 0.79 0.76 0.69  CALCIUM 8.9 8.1* 7.8*   GFR: Estimated Creatinine Clearance: 65.6 mL/min (by C-G formula based on SCr of 0.8 mg/dL). Liver Function Tests: No results for input(s): AST, ALT, ALKPHOS, BILITOT, PROT, ALBUMIN in the last 168 hours. No results for input(s): LIPASE, AMYLASE in the last 168 hours. No results for input(s): AMMONIA in the last 168 hours. Coagulation Profile:  Recent Labs Lab 11/27/15 2312 11/28/15 0748 11/28/15 1558 11/29/15 0301 11/30/15 0300  INR 3.79 3.26 2.07 1.45 1.42   Cardiac Enzymes:  Recent Labs Lab 11/27/15 2324  TROPONINI <0.03   BNP (last 3 results) No results for input(s): PROBNP in the last 8760 hours. HbA1C: No results for input(s): HGBA1C in the last 72  hours. CBG:  Recent Labs Lab 11/27/15 2036  GLUCAP 115*   Lipid Profile: No results for input(s): CHOL, HDL, LDLCALC, TRIG, CHOLHDL, LDLDIRECT in the last 72 hours. Thyroid Function Tests: No results for input(s): TSH, T4TOTAL, FREET4, T3FREE, THYROIDAB in the last 72 hours. Anemia Panel:  Recent Labs  11/27/15 2312  VITAMINB12 879  FOLATE 11.0  FERRITIN 17*  TIBC 284  IRON 8*  RETICCTPCT 3.3*   Sepsis Labs: No results for input(s): PROCALCITON, LATICACIDVEN in the last 168 hours.  No results found for this or any previous visit (from the past 240 hour(s)).       Radiology Studies: No results found.  Scheduled Meds: . levETIRAcetam  1,500 mg Oral BID  . pantoprazole (PROTONIX) IV  40 mg Intravenous Q12H  . phenytoin  300 mg Oral Daily   Continuous Infusions:     LOS: 2 days    Time spent: 65 minutes    Tylia Ewell, MD [facility] Pager [phone]  If 7PM-7AM, please contact night-coverage [email or web address] Password [email or web address] 11/30/2015, 11:00 AM

## 2015-11-30 NOTE — Progress Notes (Signed)
Oral temp 100.8 MD Grandville Silos paged no new orders received will continue to monitor

## 2015-12-01 ENCOUNTER — Encounter (HOSPITAL_COMMUNITY): Payer: Self-pay | Admitting: Gastroenterology

## 2015-12-01 ENCOUNTER — Inpatient Hospital Stay (HOSPITAL_COMMUNITY): Payer: Medicare Other

## 2015-12-01 DIAGNOSIS — F0280 Dementia in other diseases classified elsewhere without behavioral disturbance: Secondary | ICD-10-CM

## 2015-12-01 DIAGNOSIS — G309 Alzheimer's disease, unspecified: Secondary | ICD-10-CM

## 2015-12-01 DIAGNOSIS — D72829 Elevated white blood cell count, unspecified: Secondary | ICD-10-CM

## 2015-12-01 DIAGNOSIS — J690 Pneumonitis due to inhalation of food and vomit: Secondary | ICD-10-CM | POA: Diagnosis not present

## 2015-12-01 DIAGNOSIS — J189 Pneumonia, unspecified organism: Secondary | ICD-10-CM | POA: Diagnosis not present

## 2015-12-01 LAB — CBC
HEMATOCRIT: 30.9 % — AB (ref 39.0–52.0)
Hemoglobin: 9.9 g/dL — ABNORMAL LOW (ref 13.0–17.0)
MCH: 27 pg (ref 26.0–34.0)
MCHC: 32 g/dL (ref 30.0–36.0)
MCV: 84.2 fL (ref 78.0–100.0)
PLATELETS: 222 10*3/uL (ref 150–400)
RBC: 3.67 MIL/uL — AB (ref 4.22–5.81)
RDW: 16.8 % — AB (ref 11.5–15.5)
WBC: 16.7 10*3/uL — AB (ref 4.0–10.5)

## 2015-12-01 LAB — URINALYSIS, ROUTINE W REFLEX MICROSCOPIC
Bilirubin Urine: NEGATIVE
Glucose, UA: NEGATIVE mg/dL
Hgb urine dipstick: NEGATIVE
KETONES UR: NEGATIVE mg/dL
LEUKOCYTES UA: NEGATIVE
NITRITE: NEGATIVE
PROTEIN: NEGATIVE mg/dL
Specific Gravity, Urine: 1.017 (ref 1.005–1.030)
pH: 5.5 (ref 5.0–8.0)

## 2015-12-01 LAB — TYPE AND SCREEN
ABO/RH(D): O NEG
Antibody Screen: NEGATIVE
UNIT DIVISION: 0
UNIT DIVISION: 0
UNIT DIVISION: 0
Unit division: 0
Unit division: 0

## 2015-12-01 LAB — STREP PNEUMONIAE URINARY ANTIGEN: Strep Pneumo Urinary Antigen: NEGATIVE

## 2015-12-01 LAB — PROTIME-INR
INR: 1.28
Prothrombin Time: 16.1 seconds — ABNORMAL HIGH (ref 11.4–15.2)

## 2015-12-01 MED ORDER — WARFARIN SODIUM 5 MG PO TABS
5.0000 mg | ORAL_TABLET | Freq: Once | ORAL | Status: AC
  Administered 2015-12-01: 5 mg via ORAL
  Filled 2015-12-01: qty 1

## 2015-12-01 MED ORDER — VANCOMYCIN HCL IN DEXTROSE 750-5 MG/150ML-% IV SOLN
750.0000 mg | Freq: Two times a day (BID) | INTRAVENOUS | Status: DC
  Administered 2015-12-01 (×2): 750 mg via INTRAVENOUS
  Filled 2015-12-01 (×3): qty 150

## 2015-12-01 MED ORDER — DEXTROSE 5 % IV SOLN
1.0000 g | Freq: Three times a day (TID) | INTRAVENOUS | Status: DC
  Administered 2015-12-01 – 2015-12-02 (×3): 1 g via INTRAVENOUS
  Filled 2015-12-01 (×5): qty 1

## 2015-12-01 NOTE — Care Management Note (Signed)
Case Management Note Marvetta Gibbons RN, BSN Unit 2W-Case Manager [phone]  Patient Details  Name: Geremy Dressel MRN: [id number] Date of Birth: Jun 10, 1940  Subjective/Objective:   Pt admitted with GIB                 Action/Plan: PTA pt lived at home- anticipate return home- CM to follow for d/c needs  Expected Discharge Date:                  Expected Discharge Plan:  Home/Self Care  In-House Referral:     Discharge planning Services  CM Consult  Post Acute Care Choice:    Choice offered to:     DME Arranged:    DME Agency:     HH Arranged:    [place] Agency:     Status of Service:  In process, will continue to follow  If discussed at Long Length of Stay Meetings, dates discussed:    Additional Comments:  Dawayne Patricia, RN 12/01/2015, 11:36 AM

## 2015-12-01 NOTE — Progress Notes (Signed)
Patient has been educated on safety and using his call bell before getting up on his own. He continues to get up without calling for assistance. Bed alarm is in use. Call bell in reach, urinal in reach.  Cyndia Bent

## 2015-12-01 NOTE — Care Management Important Message (Signed)
Important Message  Patient Details  Name: [name] MRN: [id number] Date of Birth: [birth year]   Medicare Important Message Given:  Yes    [name] 12/01/2015, 1:32 PM

## 2015-12-01 NOTE — Progress Notes (Signed)
PT Cancellation Note  Patient Details Name: [name] MRN: [id number] DOB: 1941/02/24   Cancelled Treatment:    Reason Eval/Treat Not Completed: Patient at procedure or test/unavailable at [place], [place] 12/01/2015, 8:29 AM  Elwyn Reach, [place]

## 2015-12-01 NOTE — Progress Notes (Signed)
Patient sitting in bed, no needs at this time, call light within reach, bed alarm on

## 2015-12-01 NOTE — Evaluation (Signed)
Occupational Therapy Evaluation Patient Details Name: [name] MRN: [id number] DOB: Oct 23, 1940 Today's Date: 12/01/2015    History of Present Illness 75 yo admitted with weakness, fatigue and GIB. PMHx: Afib, dementia, CVA   Clinical Impression   PTA, pt reported that he was independent with all ADLs, IADLs and mobility. Pt currently requires setup assist for LB ADLs and min guard for basic transfers due to significant balance deficits. Pt had 3 major LOB during session and had to grab onto nearest piece of furniture in the room to regain balance. Pt rapidly changing between pleasant and irritated with therapist during session and noted cognitive deficits in problem solving, safety awareness, and slow processing. Pt plans to d/c home at discharge, but does not have any assistance from family or friends. Pt will benefit from continued acute OT to increase independence and safety with ADLs and mobility to allow for safe discharge to the next venue of care. Recommend HHOT at d/c for ADL and IADL retraining and safety evaluation.    Follow Up Recommendations  Home health OT;Supervision/Assistance - 24 hour    Equipment Recommendations  3 in 1 bedside comode;Tub/shower seat    Recommendations for Other Services       Precautions / Restrictions Precautions Precautions: Fall Restrictions Weight Bearing Restrictions: No      Mobility Bed Mobility Overal bed mobility: Modified Independent             General bed mobility comments: HOB flat, no use of bedrails.  Transfers Overall transfer level: Needs assistance Equipment used: None Transfers: Sit to/from Stand Sit to Stand: Min guard         General transfer comment: Min guard assist for safety. Pt with 3 LOB during session. Pt  did not require assist from therapist to regain balance, but did have to grab onto nearest piece of furniture to prevent falling.     Balance Overall balance assessment: Needs  assistance Sitting-balance support: No upper extremity supported;Feet supported Sitting balance-Leahy Scale: Good     Standing balance support: No upper extremity supported;During functional activity Standing balance-Leahy Scale: Fair Standing balance comment: 3 LOB posteriorly during session                            ADL Overall ADL's : Needs assistance/impaired Eating/Feeding: Set up;Sitting   Grooming: Wash/dry hands;Min guard;Standing   Upper Body Bathing: Set up;Sitting   Lower Body Bathing: Min guard;Sit to/from stand           Toilet Transfer: Cueing for safety;Ambulation;Regular Toilet;Min guard   Toileting- Clothing Manipulation and Hygiene: Min guard;Sit to/from stand       Functional mobility during ADLs: Min guard General ADL Comments: Pt with 3 LOB during session. Able to regain balance without therapist assist, but had to grab onto nearest piece of furniture to steady self.     Vision Vision Assessment?: No apparent visual deficits   Perception     Praxis      Pertinent Vitals/Pain Pain Assessment: No/denies pain     Hand Dominance Right   Extremity/Trunk Assessment Upper Extremity Assessment Upper Extremity Assessment: Overall WFL for tasks assessed   Lower Extremity Assessment Lower Extremity Assessment: Defer to PT evaluation RLE Deficits / Details: decreased dorsiflexion, otherwise appears functional, did not fully assess   Cervical / Trunk Assessment Cervical / Trunk Assessment: Normal   Communication Communication Communication: HOH   Cognition Arousal/Alertness: Awake/alert Behavior During Therapy: Flat affect  Overall Cognitive Status: History of cognitive impairments - at baseline       Memory: Decreased short-term memory             General Comments       Exercises       Shoulder Instructions      Home Living Family/patient expects to be discharged to:: Private residence Living Arrangements:  Alone Available Help at Discharge: Family;Available PRN/intermittently (daughter who lives out of town) Type of Home: Other(Comment) (Duplex) Home Access: Stairs to enter Entrance Stairs-Number of Steps: flight Entrance Stairs-Rails: Left Home Layout: One level;Other (Comment) (Pt lives on 2nd floor of duplex)     Bathroom Shower/Tub: Tub/shower unit Shower/tub characteristics: Curtain Biochemist, clinical: Standard     Home Equipment: Environmental consultant - 2 wheels;Cane - single point   Additional Comments: Pt provided different answers to PT in earlier session      Prior Functioning/Environment Level of Independence: Independent        Comments: Pt reports he is independent with all ADLs and IADLs including driving. Pt reports that he has a housekeeper      OT Diagnosis: Generalized weakness;Cognitive deficits   OT Problem List: Decreased strength;Impaired balance (sitting and/or standing);Decreased coordination;Decreased safety awareness;Decreased cognition;Decreased knowledge of use of DME or AE   OT Treatment/Interventions: Self-care/ADL training;Therapeutic exercise;DME and/or AE instruction;Therapeutic activities;Patient/family education;Balance training;Cognitive remediation/compensation    OT Goals(Current goals can be found in the care plan section) Acute Rehab OT Goals Patient Stated Goal: return home with a clean bill of health OT Goal Formulation: With patient Time For Goal Achievement: 12/15/15 Potential to Achieve Goals: Good ADL Goals Pt Will Perform Upper Body Bathing: with modified independence;sitting Pt Will Perform Lower Body Bathing: with modified independence;sit to/from stand Pt Will Transfer to Toilet: with modified independence;ambulating;regular height toilet Pt Will Perform Toileting - Clothing Manipulation and hygiene: with modified independence;sit to/from stand Additional ADL Goal #1: Pt will verbalize 3 home safety strategies to minimize fall risk at home.   OT Frequency: Min 2X/week   Barriers to D/C: Decreased caregiver support  Lives alone       Co-evaluation              End of Session Equipment Utilized During Treatment: Gait belt Nurse Communication: Mobility status  Activity Tolerance: Patient tolerated treatment well Patient left: in bed;with call bell/phone within reach;with bed alarm set   Time: 1341-1353 OT Time Calculation (min): 12 min Charges:  OT General Charges $OT Visit: 1 Procedure OT Evaluation $OT Eval Moderate Complexity: 1 Procedure G-Codes:    Redmond Baseman, OTR/L [phone] 12/01/2015, 2:17 PM

## 2015-12-01 NOTE — Progress Notes (Signed)
ANTICOAGULATION CONSULT NOTE - Follow-up Consult  Pharmacy Consult for warfarin  Indication: atrial fibrillation  No Known Allergies  Patient Measurements: Height: 6' (182.9 cm) Weight: 130 lb (59 kg) IBW/kg (Calculated) : 77.6  Vital Signs: Temp: 98.4 F (36.9 C) (09/11 0506) Temp Source: Oral (09/11 0506) BP: 113/54 (09/11 0506) Pulse Rate: 94 (09/11 0506)  Labs:  Recent Labs  11/29/15 0301 11/29/15 1541 11/30/15 0300 12/01/15 0235  HGB 7.8*  --  7.3* 9.9*  HCT 24.4*  --  23.3* 30.9*  PLT 258  --  247 222  LABPROT 17.8*  --  17.5* 16.1*  INR 1.45  --  1.42 1.28  HEPARINUNFRC  --  0.27* 0.63  --   CREATININE 0.76  --  0.69  --     Estimated Creatinine Clearance: 65.6 mL/min (by C-G formula based on SCr of 0.8 mg/dL).   Assessment: 75 y.o.malewith medical history significant of A.Flutter and stroke, is on chronic Coumadin as a result. PTA Coumadin dose 5 mg daily. Patient presents to the ED with c/o weakness fatigue, 1 week hx of melena. INR supratherapeutic at 3.8 on admission, Hgb 5.1 upon admission. Received PO vitamin K and 3 units PRBC on 9/8. EGD found errosive antral gastritis.    INR is subtherapeutic at 1.28. No bleeding noted, Hgb improved to 9.9, platelets are stable.  PTA dose: 5 mg daily although patient reported to Coumadin Clinic on 9/6 was taking 7.5 mg daily  Goal of Therapy:  INR 2-3 Monitor platelets by anticoagulation protocol: Yes   Plan:  1. Warfarin 5 mg PO tonight 2. Daily INR 3. Monitor for s/sx of bleeding   Renold Genta, PharmD, BCPS Clinical Pharmacist Phone for today - [place] - [phone] 12/01/2015 1:42 PM

## 2015-12-01 NOTE — Progress Notes (Signed)
PROGRESS NOTE    [name] Captain  [id number] DOB: 29-Jul-1940 DOA: 11/27/2015 PCP: [name], MD    Brief Narrative:   [name] is a 75 y.o. male with medical history significant of A.Flutter and stroke, is on chronic coumadin as a result.  Patient presents to the ED with c/o weakness and fatigue, onset insidiously over the past day or so.  On questioning he admits to a 1 week history of melena.  He went as usual to get his INR checked at the coumadin clinic 2 days ago, but didn't tell anyone about the melena at that time as he hadnt yet developed the anemia symptoms.  Symptoms are worse with exertion, onset yesterday at about noon, associated lack of appetite, nothing makes symptoms better, no fever, chills, N/V.  ED Course: INR down to 3.7 from 5.8 2 days ago.  Unfortunately his HGB is also down to 5.1 from what looks like a baseline of 10.    Assessment & Plan:   Principal Problem:   Gastrointestinal hemorrhage with melena Active Problems:   Erosive gastropathy: Per EGD 11/30/2015   Acute blood loss anemia   HCAP (healthcare-associated pneumonia)   Aspiration pneumonia (HCC)   Essential hypertension   Seizure disorder (HCC)   Atrial flutter (HCC)   CVA (cerebral infarction)   Long term (current) use of anticoagulants   Dementia (AD)   Coumadin toxicity   Anemia, iron deficiency   Blood loss anemia  #1 GI bleed with melena/erosive gastropathy Likely secondary to erosive gastropathy per EGD 11/30/2015.  Patient status post 3 units packed red blood cells 11/28/2015. Status post 2 units packed red blood cells 19 2017 Hemoglobin currently at 9.9 from 7.3 from 5.1 on admission. Patient's anticoagulation has been reversed and INR now at 1.28. Patient was placed on a heparin bridge as patient with history of A. fib and acute stroke. Anemia panel consistent with iron deficiency anemia. Continue Protonix every 12 hours. Patient s/p IV iron.GI following and  appreciate input and recommendations.  #2 acute blood loss anemia/iron deficiency anemia Secondary to problem #1. Patient s/p upper endoscopy, with erosive gastropathy. Status post 5 units packed red blood cells. Hemoglobin currently at 9.9 from 7.3 from 5.1 on admission. Anemia panel with iron level of 8 and a ferritin of 17. Patient s/p IV iron. Patient will likely need oral iron supplementation on discharge.   #3 A. fib Currently rate controlled. Patient signout was reversed in preparation for upper endoscopy on 11/30/2015. Coumadin has been resumed. INR 1.28.  #4 HCAP VS aspiration pneumonia Patient noted to have low-grade temps overnight. Patient with a leukocytosis today with a white count of 16.7. Check blood cultures 2. Check a sputum Gram stain and culture. Check a urine Legionella antigen. Check a urine pneumococcus antigen. Patient would recent stroke and a such will get a speech therapy evaluation. Placed empirically on IV vancomycin and IV cefepime.  #5 history of CVA Was on Coumadin for secondary stroke prevention. INR has been reversed. D/C  IV heparin bridge. Resumed coumadin per GI, 11/30/2015.  #6 history of seizure disorder Stable. No seizures noted. Continue home regimen of Keppra and Dilantin.  #7 hypertension Stable. Blood pressure improved.   #8 history of dementia Stable.  #9 leukocytosis Likely secondary to pneumonia.   DVT prophylaxis: SCDs Code Status: Full Family Communication: Updated patient. No family at bedside. Disposition Plan: Home once GI workup is complete, and Hgb stable.    Consultants:   Gastroenterology: Larwance Rote  11/28/2015  Procedures:   3 units packed red blood cells 11/28/2015   2 units PRBCs 11/30/2015  EGD-Erosive gastropathy. Dr [name] 11/30/2015  Chest x-ray 12/01/2015  Antimicrobials:   IV vancomycin 12/01/2015  IV cefepime 12/01/2015   Subjective: No chest pain. No shortness of breath. Eating lunch. Patient denies  any coughing.  Objective: Vitals:   11/30/15 1751 11/30/15 1904 11/30/15 2052 12/01/15 0506  BP:  117/65 118/60 (!) 113/54  Pulse:  93 100 94  Resp:  18 19 19   Temp: 99.2 F (37.3 C) 99.9 F (37.7 C) 99.3 F (37.4 C) 98.4 F (36.9 C)  TempSrc: Axillary Oral Oral Oral  SpO2:  100% 99% 100%  Weight:      Height:        Intake/Output Summary (Last 24 hours) at 12/01/15 1237 Last data filed at 12/01/15 0506  Gross per 24 hour  Intake             1030 ml  Output              150 ml  Net              880 ml   Filed Weights   11/28/15 0407 11/29/15 0800 11/30/15 0827  Weight: 60.1 kg (132 lb 9.6 oz) 59.2 kg (130 lb 9.6 oz) 59 kg (130 lb)    Examination:  General exam: Appears calm and comfortable  Respiratory system: Clear to auscultation. Respiratory effort normal. Cardiovascular system: S1 & S2 heard, RRR. No JVD, murmurs, rubs, gallops or clicks. No pedal edema. Gastrointestinal system: Abdomen is nondistended, soft and nontender. No organomegaly or masses felt. Normal bowel sounds heard. Central nervous system: Alert and oriented. No focal neurological deficits. Extremities: Symmetric 5 x 5 power. Skin: No rashes, lesions or ulcers Psychiatry: Judgement and insight appear normal. Mood & affect appropriate.     Data Reviewed: I have personally reviewed following labs and imaging studies  CBC:  Recent Labs Lab 11/27/15 2034  11/28/15 1558 11/28/15 1847 11/29/15 0301 11/30/15 0300 12/01/15 0235  WBC 7.5  --   --  6.7 5.5 5.4 16.7*  HGB 5.1*  < > 8.4* 8.0* 7.8* 7.3* 9.9*  HCT 16.7*  < > 25.7* 25.1* 24.4* 23.3* 30.9*  MCV 82.3  --   --  83.1 82.7 84.4 84.2  PLT 367  --   --  260 258 247 222  < > = values in this interval not displayed. Basic Metabolic Panel:  Recent Labs Lab 11/27/15 2034 11/29/15 0301 11/30/15 0300  NA 139 139 137  K 4.6 3.6 3.8  CL 106 106 107  CO2 26 25 26   GLUCOSE 126* 86 88  BUN 21* 12 8  CREATININE 0.79 0.76 0.69  CALCIUM 8.9  8.1* 7.8*   GFR: Estimated Creatinine Clearance: 65.6 mL/min (by C-G formula based on SCr of 0.8 mg/dL). Liver Function Tests: No results for input(s): AST, ALT, ALKPHOS, BILITOT, PROT, ALBUMIN in the last 168 hours. No results for input(s): LIPASE, AMYLASE in the last 168 hours. No results for input(s): AMMONIA in the last 168 hours. Coagulation Profile:  Recent Labs Lab 11/28/15 0748 11/28/15 1558 11/29/15 0301 11/30/15 0300 12/01/15 0235  INR 3.26 2.07 1.45 1.42 1.28   Cardiac Enzymes:  Recent Labs Lab 11/27/15 2324  TROPONINI <0.03   BNP (last 3 results) No results for input(s): PROBNP in the last 8760 hours. HbA1C: No results for input(s): HGBA1C in the last 72 hours. CBG:  Recent Labs  Lab 11/27/15 2036  GLUCAP 115*   Lipid Profile: No results for input(s): CHOL, HDL, LDLCALC, TRIG, CHOLHDL, LDLDIRECT in the last 72 hours. Thyroid Function Tests: No results for input(s): TSH, T4TOTAL, FREET4, T3FREE, THYROIDAB in the last 72 hours. Anemia Panel: No results for input(s): VITAMINB12, FOLATE, FERRITIN, TIBC, IRON, RETICCTPCT in the last 72 hours. Sepsis Labs: No results for input(s): PROCALCITON, LATICACIDVEN in the last 168 hours.  No results found for this or any previous visit (from the past 240 hour(s)).       Radiology Studies: Dg Chest 2 View  Result Date: 12/01/2015 CLINICAL DATA:  Leukocytosis EXAM: CHEST  2 VIEW COMPARISON:  06/02/2011 chest radiograph. FINDINGS: Stable cardiomediastinal silhouette with normal heart size. No pneumothorax. No pleural effusion. Mild patchy left lower lobe opacities. No pulmonary edema. Moderate thoracic spondylosis. IMPRESSION: Mild patchy left lower lobe opacity, which could represent a pneumonia or aspiration. Recommend follow-up chest imaging to resolution. Electronically Signed   By: Ilona Sorrel [name]   On: 12/01/2015 08:46        Scheduled Meds: . ceFEPime (MAXIPIME) IV  1 g Intravenous Q8H  .  levETIRAcetam  1,500 mg Oral BID  . pantoprazole  40 mg Oral BID  . phenytoin  300 mg Oral Daily  . vancomycin  750 mg Intravenous Q12H  . Warfarin - Pharmacist Dosing Inpatient   Does not apply q1800   Continuous Infusions:     LOS: 3 days    Time spent: 64 minutes    THOMPSON,DANIEL, MD [facility] Pager [phone]  If 7PM-7AM, please contact night-coverage [email or web address] Password [facility] 12/01/2015, 12:37 PM

## 2015-12-01 NOTE — Evaluation (Signed)
Physical Therapy Evaluation Patient Details Name: [name] MRN: [id number] DOB: 23-Jun-1940 Today's Date: 12/01/2015   History of Present Illness  75 yo admitted with weakness, fatigue and GIB. PMHx: Afib, dementia, CVA  Clinical Impression  Pt pleasant and then irritated very rapidly changing back and forth throughout session. Pt with noted cognitive deficits with difficulty and increased time to answer month, home environment, situation and problem solving. Pt unable to recall room number and when given room number and directional sign pt initiated the right direction and then required cues to complete directional cues to his room. Pt with decreased gait, cognition and balance who would benefit from acute therapy to maximize mobility, function and independence for return home.     Follow Up Recommendations Home health PT;Supervision/Assistance - 24 hour    Equipment Recommendations  Rolling walker with 5" wheels (pt states he has some equipment from CVA but unable to state what)    Recommendations for Other Services       Precautions / Restrictions Precautions Precautions: Fall      Mobility  Bed Mobility Overal bed mobility: Modified Independent                Transfers Overall transfer level: Needs assistance   Transfers: Sit to/from Stand Sit to Stand: Min guard         General transfer comment: decreased control of descent with cues for safety  Ambulation/Gait Ambulation/Gait assistance: Min assist Ambulation Distance (Feet): 350 Feet Assistive device: None Gait Pattern/deviations: Decreased dorsiflexion - right;Wide base of support   Gait velocity interpretation: at or above normal speed for age/gender General Gait Details: pt with increased knee flexion RLE to compensate for decreased dorsiflexion, increased BOS and sway with increased RUE arm swing with gait. Guarding for safety throughout without overt LOB despite gait  deviations  Stairs Stairs: Yes Stairs assistance: Modified independent (Device/Increase time) Stair Management: Alternating pattern;Forwards;One rail Left Number of Stairs: 11 General stair comments: pt able to complete stairs with rail without physical assist or LOB  Wheelchair Mobility    Modified Rankin (Stroke Patients Only)       Balance Overall balance assessment: Needs assistance   Sitting balance-Leahy Scale: Good       Standing balance-Leahy Scale: Good                               Pertinent Vitals/Pain Pain Assessment: No/denies pain    Home Living Family/patient expects to be discharged to:: Private residence Living Arrangements: Alone   Type of Home: House Home Access: Stairs to enter Entrance Stairs-Rails: Left Entrance Stairs-Number of Steps: flight Home Layout: One level Home Equipment: None      Prior Function Level of Independence: Independent         Comments: pt reports he has a housekeeper periodically but otherwise does everything for himself     Hand Dominance        Extremity/Trunk Assessment   Upper Extremity Assessment: Defer to OT evaluation           Lower Extremity Assessment: RLE deficits/detail RLE Deficits / Details: decreased dorsiflexion, otherwise appears functional, did not fully assess    Cervical / Trunk Assessment: Normal  Communication   Communication: No difficulties  Cognition Arousal/Alertness: Awake/alert   Overall Cognitive Status: No family/caregiver present to determine baseline cognitive functioning       Memory: Decreased short-term memory  General Comments      Exercises        Assessment/Plan    PT Assessment Patient needs continued PT services  PT Diagnosis Abnormality of gait;Altered mental status   PT Problem List Decreased activity tolerance;Decreased balance;Decreased cognition  PT Treatment Interventions DME instruction;Gait  training;Therapeutic activities;Therapeutic exercise;Patient/family education;Balance training;Functional mobility training   PT Goals (Current goals can be found in the Care Plan section) Acute Rehab PT Goals Patient Stated Goal: return home with a clean bill of health PT Goal Formulation: With patient Time For Goal Achievement: 12/15/15 Potential to Achieve Goals: Good    Frequency Min 3X/week   Barriers to discharge Decreased caregiver support      Co-evaluation               End of Session Equipment Utilized During Treatment: Gait belt Activity Tolerance: Patient tolerated treatment well Patient left: in chair;with call bell/phone within reach;with chair alarm set Nurse Communication: Mobility status         Time: [id number] PT Time Calculation (min) (ACUTE ONLY): 25 min   Charges:   PT Evaluation $PT Eval Moderate Complexity: 1 Procedure     PT G CodesMelford Aase 12/01/2015, 10:51 AM Elwyn Reach, [place]

## 2015-12-01 NOTE — Progress Notes (Signed)
Pharmacy Antibiotic Note  [name] is a 75 y.o. male admitted on 11/27/2015 with upper GIB.  Pharmacy has been consulted for vancomycin dosing for PNA. CXR with PNA vs aspiration. UA neg but other cultures pending. Also on cefepime.   Plan: Vancomycin 750 mg IV q12h, goal 15-20 mcg/ml Monitor renal function, clinical progress, and culture data Trough as clinically indicated  Height: 6' (182.9 cm) Weight: 130 lb (59 kg) IBW/kg (Calculated) : 77.6  Temp (24hrs), Avg:99.3 F (37.4 C), Min:98 F (36.7 C), Max:100.8 F (38.2 C)   Recent Labs Lab 11/27/15 2034 11/28/15 1847 11/29/15 0301 11/30/15 0300 12/01/15 0235  WBC 7.5 6.7 5.5 5.4 16.7*  CREATININE 0.79  --  0.76 0.69  --     Estimated Creatinine Clearance: 65.6 mL/min (by C-G formula based on SCr of 0.8 mg/dL).    No Known Allergies  Antimicrobials this admission: Cefepime 9/11>> Vanc 9/11>>  Dose adjustments this admission:   Microbiology results: 9/11 UCx: pending 9/11 BCx: pending  Thank you for allowing pharmacy to be a part of this patient's care.  Renold Genta, PharmD, BCPS Clinical Pharmacist Phone for today - [place] - [phone] 12/01/2015 1:26 PM

## 2015-12-02 DIAGNOSIS — I6340 Cerebral infarction due to embolism of unspecified cerebral artery: Secondary | ICD-10-CM

## 2015-12-02 LAB — BASIC METABOLIC PANEL
ANION GAP: 6 (ref 5–15)
BUN: 6 mg/dL (ref 6–20)
CALCIUM: 8.1 mg/dL — AB (ref 8.9–10.3)
CO2: 27 mmol/L (ref 22–32)
CREATININE: 0.73 mg/dL (ref 0.61–1.24)
Chloride: 105 mmol/L (ref 101–111)
GLUCOSE: 109 mg/dL — AB (ref 65–99)
Potassium: 3.5 mmol/L (ref 3.5–5.1)
Sodium: 138 mmol/L (ref 135–145)

## 2015-12-02 LAB — CBC
HCT: 31 % — ABNORMAL LOW (ref 39.0–52.0)
Hemoglobin: 10 g/dL — ABNORMAL LOW (ref 13.0–17.0)
MCH: 27.5 pg (ref 26.0–34.0)
MCHC: 32.3 g/dL (ref 30.0–36.0)
MCV: 85.2 fL (ref 78.0–100.0)
PLATELETS: 207 10*3/uL (ref 150–400)
RBC: 3.64 MIL/uL — ABNORMAL LOW (ref 4.22–5.81)
RDW: 17 % — AB (ref 11.5–15.5)
WBC: 7.2 10*3/uL (ref 4.0–10.5)

## 2015-12-02 LAB — HIV ANTIBODY (ROUTINE TESTING W REFLEX): HIV SCREEN 4TH GENERATION: NONREACTIVE

## 2015-12-02 LAB — URINE CULTURE

## 2015-12-02 LAB — PROTIME-INR
INR: 1.49
Prothrombin Time: 18.2 seconds — ABNORMAL HIGH (ref 11.4–15.2)

## 2015-12-02 LAB — LEGIONELLA PNEUMOPHILA SEROGP 1 UR AG: L. PNEUMOPHILA SEROGP 1 UR AG: NEGATIVE

## 2015-12-02 MED ORDER — AMOXICILLIN-POT CLAVULANATE 875-125 MG PO TABS
1.0000 | ORAL_TABLET | Freq: Two times a day (BID) | ORAL | Status: DC
  Administered 2015-12-02: 1 via ORAL
  Filled 2015-12-02: qty 1

## 2015-12-02 MED ORDER — PANTOPRAZOLE SODIUM 40 MG PO TBEC: 40.0000 mg | DELAYED_RELEASE_TABLET | Freq: Two times a day (BID) | ORAL | 3 refills | Status: DC

## 2015-12-02 MED ORDER — POTASSIUM CHLORIDE CRYS ER 20 MEQ PO TBCR
40.0000 meq | EXTENDED_RELEASE_TABLET | Freq: Once | ORAL | Status: AC
  Administered 2015-12-02: 40 meq via ORAL
  Filled 2015-12-02: qty 2

## 2015-12-02 MED ORDER — WARFARIN SODIUM 2.5 MG PO TABS: 5.0000 mg | ORAL_TABLET | Freq: Every day | ORAL | Status: DC

## 2015-12-02 MED ORDER — AMOXICILLIN-POT CLAVULANATE 875-125 MG PO TABS: 1.0000 | ORAL_TABLET | Freq: Two times a day (BID) | ORAL | 0 refills | Status: AC

## 2015-12-02 NOTE — Care Management Note (Signed)
Case Management Note Marvetta Gibbons RN, BSN Unit 2W-Case Manager [phone]  Patient Details  Name: [name] MRN: [id number] Date of Birth: 11/23/1940  Subjective/Objective:   Pt admitted with GIB                 Action/Plan: PTA pt lived at home- anticipate return home- CM to follow for d/c needs  Expected Discharge Date:   12/02/15               Expected Discharge Plan:  [place]  In-House Referral:     Discharge planning Services  CM Consult, Follow-up appt scheduled  Post Acute Care Choice:  Durable Medical Equipment, Home Health Choice offered to:  Patient, Adult Children  DME Arranged:  3-N-1, Walker rolling DME Agency:  Other - Comment  HH Arranged:  RN, PT, OT, Patient Refused [place] Agency:     Status of Service:  Completed, signed off  If discussed at [place] of Stay Meetings, dates discussed:    Discharge Disposition: home self care   Additional Comments:  12/02/15- 1200- [place] Sara rN, CM- pt for d/c home today- orders placed for DME and HH-- spoke with pt and daughter at bedside - per conversation- pt states that he has had [facility] in past following stroke- pt reports that he did not find it very useful- discussed again the benefits of HH and the recommendations made by PT eval here- along with recommendations for DME- RW and 3n1- pt politely states that he does not want any DME and does not want any HH services- daughter expressed to pt that she felt it would help him but pt still politely refused any services- explained that if pt changed his mind that MD had placed orders and CM could arrange prior to discharge if he decided that he wanted services. Per MD request call made to [place] cou[facility]- pt has upcoming appointment for 9/15- at 2:15 for INR check.   Dawayne Patricia, RN 12/02/2015, 12:21 PM

## 2015-12-02 NOTE — Evaluation (Addendum)
Clinical/Bedside Swallow Evaluation Patient Details  Name: [name] MRN: [id number] Date of Birth: 02-Aug-1940  Today's Date: 12/02/2015 Time: SLP Start Time (ACUTE ONLY): 0950 SLP Stop Time (ACUTE ONLY): 1005 SLP Time Calculation (min) (ACUTE ONLY): 15 min  Past Medical History:  Past Medical History:  Diagnosis Date  . Hiatal hernia   . HTN (hypertension)    pt denies h/o HTN though it appears he was started on spironolactone during his hospitalization 3/13  . Pancreatitis   . Seizures ([place])   . Stroke ([place])   . Typical atrial flutter ([place]) 3/13   Past Surgical History:  Past Surgical History:  Procedure Laterality Date  . ESOPHAGOGASTRODUODENOSCOPY N/A 11/30/2015   Procedure: ESOPHAGOGASTRODUODENOSCOPY (EGD);  Surgeon: Wonda Horner, MD;  Location: [name] ENDOSCOPY;  Service: Endoscopy;  Laterality: N/A;  . no surgical history  06/2011   HPI:  74 y.o.malewith medical history significant of A.Flutter and stroke; admitted through ED with weakness, fatigue.  Dx GIB with melena; erosive gastropathy; HCAP with concerns for potential aspiration. Hx remote left CVA 2013.     Assessment / Plan / Recommendation Clinical Impression  Pt presents with overall functional swallow with adequate mastication, brisk swallow response, occasional throat-clearing with PO consumption (regardless of consistency); no overt coughing with 3 oz water screen.  Pt with no hx of pna;  hx of acute dysphagia after CVA in March of 2013 - noted to have frequent throat-clearing at the time; C2-3 enlarged per notes (?osteophytes) - changes in swallow function were attributable to anatomy as well as acute CVA at that time.  Recommend pt continue current diet of regular consistency, thin liquids, meds whole in liquid.   Doubt pna is dysphagia-related.      Aspiration Risk  Mild aspiration risk    Diet Recommendation   regular, thin liquids  Medication Administration: Whole meds with liquid    Other   Recommendations Oral Care Recommendations: Oral care BID   Follow up Recommendations  None    Frequency and Duration            Prognosis        Swallow Study   General HPI: 75 y.o.malewith medical history significant of A.Flutter and stroke; admitted through ED with weakness, fatigue.  Dx GIB with melena; erosive gastropathy; HCAP with concerns for potential aspiration. Hx remote left CVA 2013.   Type of Study: Bedside Swallow Evaluation Previous Swallow Assessment: 06/07/11 MBS - dys 3, thin liquids Diet Prior to this Study: Regular;Thin liquids Temperature Spikes Noted: No Respiratory Status: Room air History of Recent Intubation: No Behavior/Cognition: Alert;Cooperative Oral Cavity Assessment: Within Functional Limits Oral Care Completed by SLP: No Oral Cavity - Dentition: Adequate natural dentition Vision: Functional for self-feeding Self-Feeding Abilities: Able to feed self Patient Positioning: Upright in bed Baseline Vocal Quality: Hoarse Volitional Cough: Strong Volitional Swallow: Able to elicit    Oral/Motor/Sensory Function Overall Oral Motor/Sensory Function: Within functional limits   Ice Chips Ice chips: Within functional limits Presentation: Self Fed;Spoon   Thin Liquid Thin Liquid: Within functional limits Presentation: Cup;Self Fed    Nectar Thick Nectar Thick Liquid: Not tested   Honey Thick Honey Thick Liquid: Not tested   Puree Puree: Within functional limits   Solid   GO   Solid: Within functional limits       [name] L. Tivis Ringer, [place] CCC/SLP Pager [phone]  [name] 12/02/2015,10:23 AM

## 2015-12-02 NOTE — Discharge Summary (Signed)
Physician Discharge Summary  [name] [id number] DOB: Mar 10, 1941 DOA: 11/27/2015  PCP: [name], MD  Admit date: 11/27/2015 Discharge date: 12/02/2015  Time spent: 65 minutes  Recommendations for Outpatient Follow-up:  1. Follow-up with the Coumadin clinic on Friday, 12/05/2015 as previously scheduled. Discharged INR was 1.49. 2. Follow-up with [name], MD in 2 weeks. On follow-up patient will need a CBC done to follow-up on his H&H.   Discharge Diagnoses:  Principal Problem:   Gastrointestinal hemorrhage with melena Active Problems:   Erosive gastropathy: Per EGD 11/30/2015   Acute blood loss anemia   HCAP (healthcare-associated pneumonia)   Aspiration pneumonia (HCC)   Essential hypertension   Seizure disorder (HCC)   Atrial flutter (HCC)   CVA (cerebral infarction)   Long term (current) use of anticoagulants   Dementia (AD)   Coumadin toxicity   Anemia, iron deficiency   Blood loss anemia   Leukocytosis   Discharge Condition: Stable and improved  Diet recommendation: Heart healthy  Filed Weights   11/28/15 0407 11/29/15 0800 11/30/15 0827  Weight: 60.1 kg (132 lb 9.6 oz) 59.2 kg (130 lb 9.6 oz) 59 kg (130 lb)    History of present illness:  Per Dr. Mylo Red Dmarius Dominguez is a 75 y.o. male with medical history significant of A.Flutter and stroke, is on chronic coumadin as a result.  Patient presented to the ED with c/o weakness and fatigue, onset insidiously over the past day or so.  On questioning he admitted to a 1 week history of melena.  He went as usual to get his INR checked at the coumadin clinic 2 days prior to admission, but didn't tell anyone about the melena at that time as he hadnt yet developed the anemia symptoms.  Symptoms were worse with exertion, onset 1 day prior to admission, at about noon, associated lack of appetite, nothing makes symptoms better, no fever, chills, N/V.  ED Course: INR down to 3.7 from 5.8 2  days prior to admission.  Unfortunately his HGB is also down to 5.1 from what looks like a baseline of 10.   Hospital Course:  #1 GI bleed with melena/erosive gastropathy Likely secondary to erosive gastropathy per EGD 11/30/2015.  Patient status post 3 units packed red blood cells 11/28/2015. Status post 2 units packed red blood cells 11/30/2015 Hemoglobin improved and was at 10.0 by day of discharge from 5.1 on admission. Patient was seen in consultation by GI and followed by GI during the hospitalization and underwent an upper endoscopy. Patient's anticoagulation has been reversed and INR now at 1.49 on day of discharge. Patient was placed on a heparin bridge as patient with history of A. fib and acute stroke prior to endoscopy. Anemia panel consistent with iron deficiency anemia. Patient was maintained on IV PPI prior to endoscopy and transition to oral Protonix twice daily per GI recommendations. Patient also received IV iron during the hospitalization. Patient be discharged on oral iron supplementation. Patient improved was in stable condition and had no further melanotic stools. Continue Protonix every 12 hours.   #2 acute blood loss anemia/iron deficiency anemia Secondary to problem #1. Patient s/p upper endoscopy, with erosive gastropathy. Status post 5 units packed red blood cells. Hemoglobin improved posttransfusion and was 10.0 by day of discharge from 9.9 from 7.3 from 5.1 on admission. Anemia panel with iron level of 8 and a ferritin of 17. Patient s/p IV iron. Patient will be discharged on oral iron supplementation.  #3 A. fib Remained  rate controlled throughout the hospitalization. Patient's INR was reversed in preparation for upper endoscopy on 11/30/2015. Coumadin was subsequently resumed post upper endoscopy without a bridge. Patient will be discharged on Coumadin 5 mg daily and will need a INR checked on Friday, 12/05/2015 as previously scheduled.   #4 HCAP VS aspiration  pneumonia Patient noted to have low-grade temps overnight on 11/30/2015. On 12/01/2015 patient was noted to have a leukocytosis with a white count of 16.7. Blood cultures were ordered with no growth to date. Sputum Gram stain and cultures were also ordered. Urine Legionella antigen urine pneumococcus antigen were negative. Patient was seen by speech therapy. Patient was initially placed empirically on IV vancomycin and IV cefepime. Patient improved clinically remained afebrile and resolution of leukocytosis. Patient be discharged on 6 more days of oral Augmentin to complete a course of antibiotic treatment. Outpatient follow-up.   #5 history of CVA Was on Coumadin for secondary stroke prevention. INR was reversed during the hospitalization as it was supratherapeutic and patient had to undergo upper endoscopy for evaluation of his melanotic stools. Once INR was less than 2 patient was placed on a heparin bridge prior to upper endoscopy. Post endoscopy patient's Coumadin was resumed per recommendations from GI on 11/30/2015. Patient remained stable and will follow-up in the Coumadin clinic as outpatient.  #6 history of seizure disorder Stable. No seizures noted. Continued on home regimen of Keppra and Dilantin.  #7 hypertension Stable. During the hospitalization patient was noted with borderline so blood pressures. Post transfusion of pack red blood cells and gentle hydration patient's blood pressure improved by day of discharge.  #8 history of dementia Stable.  #9 leukocytosis Likely secondary to pneumonia. Resolved on antibiotics.   Procedures:  3 units packed red blood cells 11/28/2015   2 units PRBCs 11/30/2015  EGD-Erosive gastropathy. Dr [name] 11/30/2015  Chest x-ray 12/01/2015   Consultations:  Gastroenterology: Larwance Rote 11/28/2015  Discharge Exam: Vitals:   12/02/15 0353 12/02/15 1326  BP: 100/65 96/65  Pulse: 84 84  Resp: 16 17  Temp: 98.1 F (36.7 C) 98 F (36.7  C)    General: NAD Cardiovascular: RRR Respiratory: CTAB  Discharge Instructions   Discharge Instructions    Diet - low sodium heart healthy    Complete by:  As directed   Discharge instructions    Complete by:  As directed   Follow up at coumadin clinic as previously scheduled on Friday.   Increase activity slowly    Complete by:  As directed     Current Discharge Medication List    START taking these medications   Details  amoxicillin-clavulanate (AUGMENTIN) 875-125 MG tablet Take 1 tablet by mouth every 12 (twelve) hours. Qty: 12 tablet, Refills: 0    pantoprazole (PROTONIX) 40 MG tablet Take 1 tablet (40 mg total) by mouth 2 (two) times daily. Qty: 60 tablet, Refills: 3      CONTINUE these medications which have CHANGED   Details  warfarin (COUMADIN) 2.5 MG tablet Take 2 tablets (5 mg total) by mouth daily at 6 PM.      CONTINUE these medications which have NOT CHANGED   Details  ferrous sulfate 325 (65 FE) MG tablet Take 325 mg by mouth daily.    levETIRAcetam (KEPPRA) 500 MG tablet Take 1 tablet (500 mg total) by mouth 2 (two) times daily. Qty: 180 tablet, Refills: 1    phenytoin (DILANTIN) 100 MG ER capsule Take 3 capsules (300 mg total) by mouth daily. Qty: 90 capsule,  Refills: 2   Associated Diagnoses: Localization-related symptomatic epilepsy and epileptic syndromes with complex partial seizures, not intractable, without status epilepticus ([place])       No Known Allergies Follow-up Information    [place] [facility] Follow up on 12/05/2015.   Specialty:  Cardiology Why:  INR check at 2:15- for coumadin  Contact information: [address], [place], [place]. Schedule an appointment as soon as possible for a visit in 2 week(s).   Specialty:  Internal Medicine Contact information: [address] [place]  [address] [phone]             The results of significant diagnostics from this hospitalization (including imaging, microbiology, ancillary and laboratory) are listed below for reference.    Significant Diagnostic Studies: Dg Chest 2 View  Result Date: 12/01/2015 CLINICAL DATA:  Leukocytosis EXAM: CHEST  2 VIEW COMPARISON:  06/02/2011 chest radiograph. FINDINGS: Stable cardiomediastinal silhouette with normal heart size. No pneumothorax. No pleural effusion. Mild patchy left lower lobe opacities. No pulmonary edema. Moderate thoracic spondylosis. IMPRESSION: Mild patchy left lower lobe opacity, which could represent a pneumonia or aspiration. Recommend follow-up chest imaging to resolution. Electronically Signed   By: Ilona Sorrel [name]   On: 12/01/2015 08:46    Microbiology: Recent Results (from the past 240 hour(s))  Urine culture     Status: Abnormal   Collection Time: 12/01/15  1:00 PM  Result Value Ref Range Status   Specimen Description URINE, CLEAN CATCH  Final   Special Requests NONE  Final   Culture <10,000 COLONIES/mL INSIGNIFICANT GROWTH (A)  Final   Report Status 12/02/2015 FINAL  Final     Labs: Basic Metabolic Panel:  Recent Labs Lab 11/27/15 2034 11/29/15 0301 11/30/15 0300 12/02/15 0455  NA 139 139 137 138  K 4.6 3.6 3.8 3.5  CL 106 106 107 105  CO2 26 25 26 27   GLUCOSE 126* 86 88 109*  BUN 21* 12 8 6   CREATININE 0.79 0.76 0.69 0.73  CALCIUM 8.9 8.1* 7.8* 8.1*   Liver Function Tests: No results for input(s): AST, ALT, ALKPHOS, BILITOT, PROT, ALBUMIN in the last 168 hours. No results for input(s): LIPASE, AMYLASE in the last 168 hours. No results for input(s): AMMONIA in the last 168 hours. CBC:  Recent Labs Lab 11/28/15 1847 11/29/15 0301 11/30/15 0300 12/01/15 0235 12/02/15 0455  WBC 6.7 5.5 5.4 16.7* 7.2  HGB 8.0* 7.8* 7.3* 9.9* 10.0*  HCT 25.1* 24.4* 23.3* 30.9* 31.0*  MCV 83.1 82.7 84.4 84.2 85.2  PLT 260 258 247 222 207   Cardiac Enzymes:  Recent Labs Lab  11/27/15 2324  TROPONINI <0.03   BNP: BNP (last 3 results) No results for input(s): BNP in the last 8760 hours.  ProBNP (last 3 results) No results for input(s): PROBNP in the last 8760 hours.  CBG:  Recent Labs Lab 11/27/15 2036  GLUCAP 115*       Signed:  Treena Cosman MD.  [facility] 12/02/2015, 1:28 PM

## 2015-12-02 NOTE — Progress Notes (Signed)
Pt requesting to use the bathroom, he was very unsteady getting out of bed, stood and fell back  He refuses to use the bedside commode/walker  Pt strongly encouraged to use both assistive devices to reduce risk of falling and injury  However, he adamently refuses

## 2015-12-04 ENCOUNTER — Telehealth: Payer: Self-pay | Admitting: *Deleted

## 2015-12-04 NOTE — Telephone Encounter (Signed)
Spoke with pt's daughter and she states he has just got home from hospital and is not able to keep his coumadin appt and she was wanting to get someone to come to the home to check his INR Daughter instructed that she would need to call his PCP and request this order from the doctor and she states she has number  to call to see if this can be set up. Also she states did make an appt for him to be seen on Tuesday to have INR checked Instructed to call back with any questions

## 2015-12-06 LAB — CULTURE, BLOOD (ROUTINE X 2)
Culture: NO GROWTH
Culture: NO GROWTH

## 2015-12-08 ENCOUNTER — Other Ambulatory Visit: Payer: Self-pay | Admitting: Geriatric Medicine

## 2015-12-08 ENCOUNTER — Ambulatory Visit
Admission: RE | Admit: 2015-12-08 | Discharge: 2015-12-08 | Disposition: A | Discharge status: Home / Self Care | Payer: Medicare Other | Source: Ambulatory Visit | Attending: Geriatric Medicine | Admitting: Geriatric Medicine

## 2015-12-08 DIAGNOSIS — K2951 Unspecified chronic gastritis with bleeding: Secondary | ICD-10-CM | POA: Diagnosis not present

## 2015-12-08 DIAGNOSIS — Z23 Encounter for immunization: Secondary | ICD-10-CM | POA: Diagnosis not present

## 2015-12-08 DIAGNOSIS — J189 Pneumonia, unspecified organism: Secondary | ICD-10-CM | POA: Diagnosis not present

## 2015-12-08 DIAGNOSIS — I10 Essential (primary) hypertension: Secondary | ICD-10-CM | POA: Diagnosis not present

## 2015-12-08 DIAGNOSIS — J181 Lobar pneumonia, unspecified organism: Secondary | ICD-10-CM | POA: Diagnosis not present

## 2015-12-09 ENCOUNTER — Ambulatory Visit (INDEPENDENT_AMBULATORY_CARE_PROVIDER_SITE_OTHER): Payer: Medicare Other | Admitting: *Deleted

## 2015-12-09 DIAGNOSIS — I4892 Unspecified atrial flutter: Secondary | ICD-10-CM

## 2015-12-09 DIAGNOSIS — Z5181 Encounter for therapeutic drug level monitoring: Secondary | ICD-10-CM

## 2015-12-09 DIAGNOSIS — Z7901 Long term (current) use of anticoagulants: Secondary | ICD-10-CM | POA: Diagnosis not present

## 2015-12-09 LAB — POCT INR: INR: 3.4

## 2015-12-18 ENCOUNTER — Other Ambulatory Visit: Payer: Self-pay | Admitting: Neurology

## 2015-12-18 DIAGNOSIS — G40209 Localization-related (focal) (partial) symptomatic epilepsy and epileptic syndromes with complex partial seizures, not intractable, without status epilepticus: Secondary | ICD-10-CM

## 2015-12-23 ENCOUNTER — Ambulatory Visit (INDEPENDENT_AMBULATORY_CARE_PROVIDER_SITE_OTHER): Payer: Medicare Other | Admitting: *Deleted

## 2015-12-23 DIAGNOSIS — Z7901 Long term (current) use of anticoagulants: Secondary | ICD-10-CM | POA: Diagnosis not present

## 2015-12-23 DIAGNOSIS — Z5181 Encounter for therapeutic drug level monitoring: Secondary | ICD-10-CM | POA: Diagnosis not present

## 2015-12-23 DIAGNOSIS — I4892 Unspecified atrial flutter: Secondary | ICD-10-CM | POA: Diagnosis not present

## 2015-12-23 LAB — POCT INR: INR: 2.4

## 2016-01-07 ENCOUNTER — Ambulatory Visit: Payer: Medicare Other | Admitting: Neurology

## 2016-01-07 DIAGNOSIS — D649 Anemia, unspecified: Secondary | ICD-10-CM | POA: Diagnosis not present

## 2016-01-13 ENCOUNTER — Ambulatory Visit (INDEPENDENT_AMBULATORY_CARE_PROVIDER_SITE_OTHER): Payer: Medicare Other | Admitting: *Deleted

## 2016-01-13 ENCOUNTER — Encounter (INDEPENDENT_AMBULATORY_CARE_PROVIDER_SITE_OTHER): Payer: Self-pay

## 2016-01-13 DIAGNOSIS — Z5181 Encounter for therapeutic drug level monitoring: Secondary | ICD-10-CM

## 2016-01-13 DIAGNOSIS — Z7901 Long term (current) use of anticoagulants: Secondary | ICD-10-CM

## 2016-01-13 DIAGNOSIS — I4892 Unspecified atrial flutter: Secondary | ICD-10-CM | POA: Diagnosis not present

## 2016-01-13 LAB — POCT INR: INR: 1.7

## 2016-01-27 ENCOUNTER — Ambulatory Visit (INDEPENDENT_AMBULATORY_CARE_PROVIDER_SITE_OTHER): Payer: Medicare Other | Admitting: *Deleted

## 2016-01-27 DIAGNOSIS — Z5181 Encounter for therapeutic drug level monitoring: Secondary | ICD-10-CM

## 2016-01-27 DIAGNOSIS — I4892 Unspecified atrial flutter: Secondary | ICD-10-CM

## 2016-01-27 DIAGNOSIS — Z7901 Long term (current) use of anticoagulants: Secondary | ICD-10-CM | POA: Diagnosis not present

## 2016-01-27 LAB — POCT INR: INR: 2.5

## 2016-01-27 MED ORDER — WARFARIN SODIUM 2.5 MG PO TABS: ORAL_TABLET | ORAL | 3 refills | Status: DC

## 2016-02-16 ENCOUNTER — Encounter (HOSPITAL_COMMUNITY): Payer: Self-pay

## 2016-02-16 ENCOUNTER — Emergency Department (HOSPITAL_COMMUNITY)
Admission: EM | Admit: 2016-02-16 | Discharge: 2016-02-16 | Disposition: A | Discharge status: Home / Self Care | Payer: Medicare Other | Attending: Emergency Medicine | Admitting: Emergency Medicine

## 2016-02-16 DIAGNOSIS — I10 Essential (primary) hypertension: Secondary | ICD-10-CM | POA: Insufficient documentation

## 2016-02-16 DIAGNOSIS — Y92410 Unspecified street and highway as the place of occurrence of the external cause: Secondary | ICD-10-CM | POA: Insufficient documentation

## 2016-02-16 DIAGNOSIS — Y9389 Activity, other specified: Secondary | ICD-10-CM | POA: Insufficient documentation

## 2016-02-16 DIAGNOSIS — T1490XA Injury, unspecified, initial encounter: Secondary | ICD-10-CM | POA: Diagnosis not present

## 2016-02-16 DIAGNOSIS — Z8673 Personal history of transient ischemic attack (TIA), and cerebral infarction without residual deficits: Secondary | ICD-10-CM | POA: Diagnosis not present

## 2016-02-16 DIAGNOSIS — R000 Tachycardia, unspecified: Secondary | ICD-10-CM | POA: Diagnosis not present

## 2016-02-16 DIAGNOSIS — Z7901 Long term (current) use of anticoagulants: Secondary | ICD-10-CM | POA: Diagnosis not present

## 2016-02-16 DIAGNOSIS — Z87891 Personal history of nicotine dependence: Secondary | ICD-10-CM | POA: Diagnosis not present

## 2016-02-16 DIAGNOSIS — Z79899 Other long term (current) drug therapy: Secondary | ICD-10-CM | POA: Insufficient documentation

## 2016-02-16 DIAGNOSIS — G40909 Epilepsy, unspecified, not intractable, without status epilepticus: Secondary | ICD-10-CM | POA: Diagnosis not present

## 2016-02-16 DIAGNOSIS — Y999 Unspecified external cause status: Secondary | ICD-10-CM | POA: Insufficient documentation

## 2016-02-16 DIAGNOSIS — R569 Unspecified convulsions: Secondary | ICD-10-CM | POA: Insufficient documentation

## 2016-02-16 DIAGNOSIS — Z041 Encounter for examination and observation following transport accident: Secondary | ICD-10-CM | POA: Diagnosis not present

## 2016-02-16 LAB — CBC WITH DIFFERENTIAL/PLATELET
BASOS ABS: 0.1 10*3/uL (ref 0.0–0.1)
Basophils Relative: 1 %
EOS ABS: 0 10*3/uL (ref 0.0–0.7)
Eosinophils Relative: 0 %
HCT: 31.5 % — ABNORMAL LOW (ref 39.0–52.0)
Hemoglobin: 10.4 g/dL — ABNORMAL LOW (ref 13.0–17.0)
LYMPHS ABS: 0.6 10*3/uL — AB (ref 0.7–4.0)
Lymphocytes Relative: 9 %
MCH: 28.9 pg (ref 26.0–34.0)
MCHC: 33 g/dL (ref 30.0–36.0)
MCV: 87.5 fL (ref 78.0–100.0)
MONO ABS: 0.8 10*3/uL (ref 0.1–1.0)
Monocytes Relative: 12 %
NEUTROS ABS: 5.3 10*3/uL (ref 1.7–7.7)
Neutrophils Relative %: 78 %
PLATELETS: 279 10*3/uL (ref 150–400)
RBC: 3.6 MIL/uL — AB (ref 4.22–5.81)
RDW: 22.7 % — AB (ref 11.5–15.5)
WBC: 6.8 10*3/uL (ref 4.0–10.5)

## 2016-02-16 LAB — BASIC METABOLIC PANEL
Anion gap: 7 (ref 5–15)
BUN: 9 mg/dL (ref 6–20)
CALCIUM: 8.9 mg/dL (ref 8.9–10.3)
CO2: 30 mmol/L (ref 22–32)
CREATININE: 0.63 mg/dL (ref 0.61–1.24)
Chloride: 101 mmol/L (ref 101–111)
Glucose, Bld: 97 mg/dL (ref 65–99)
Potassium: 4 mmol/L (ref 3.5–5.1)
SODIUM: 138 mmol/L (ref 135–145)

## 2016-02-16 LAB — PHENYTOIN LEVEL, TOTAL: Phenytoin Lvl: 14.7 ug/mL (ref 10.0–20.0)

## 2016-02-16 LAB — PROTIME-INR
INR: 1.6
PROTHROMBIN TIME: 19.2 s — AB (ref 11.4–15.2)

## 2016-02-16 NOTE — ED Notes (Signed)
Called CT to notify of DC

## 2016-02-16 NOTE — ED Notes (Signed)
Pt refused to get undressed and in a hospital gown stating "I just had a seizure, all this is unnecessary."
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
## 2016-02-16 NOTE — ED Notes (Signed)
Pt returned to room  
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
## 2016-02-16 NOTE — ED Notes (Addendum)
Patient transported to CT 
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
## 2016-02-16 NOTE — ED Triage Notes (Signed)
Pt. Refusing to get into gown stating "I am not sick, I just had a seizure. Is all this necessary?" This nurse assured pt. That we just wanted to check him out to make sure he was fine and if he wanted to stay in his clothing that was his choice.

## 2016-02-17 ENCOUNTER — Ambulatory Visit (INDEPENDENT_AMBULATORY_CARE_PROVIDER_SITE_OTHER): Payer: Medicare Other | Admitting: *Deleted

## 2016-02-17 DIAGNOSIS — I4892 Unspecified atrial flutter: Secondary | ICD-10-CM

## 2016-02-17 DIAGNOSIS — Z5181 Encounter for therapeutic drug level monitoring: Secondary | ICD-10-CM | POA: Diagnosis not present

## 2016-02-17 DIAGNOSIS — I63132 Cerebral infarction due to embolism of left carotid artery: Secondary | ICD-10-CM

## 2016-02-17 DIAGNOSIS — Z7901 Long term (current) use of anticoagulants: Secondary | ICD-10-CM

## 2016-02-17 LAB — PHENYTOIN LEVEL, FREE AND TOTAL
PHENYTOIN FREE: 1.1 ug/mL (ref 1.0–2.0)
PHENYTOIN, TOTAL: 16.5 ug/mL (ref 10.0–20.0)

## 2016-02-17 LAB — POCT INR: INR: 1.5

## 2016-02-28 DIAGNOSIS — R569 Unspecified convulsions: Secondary | ICD-10-CM | POA: Diagnosis not present

## 2016-02-28 DIAGNOSIS — M79604 Pain in right leg: Secondary | ICD-10-CM | POA: Diagnosis not present

## 2016-02-28 DIAGNOSIS — S82131A Displaced fracture of medial condyle of right tibia, initial encounter for closed fracture: Secondary | ICD-10-CM | POA: Diagnosis not present

## 2016-02-28 DIAGNOSIS — S80811A Abrasion, right lower leg, initial encounter: Secondary | ICD-10-CM | POA: Diagnosis not present

## 2016-02-28 DIAGNOSIS — I69351 Hemiplegia and hemiparesis following cerebral infarction affecting right dominant side: Secondary | ICD-10-CM | POA: Diagnosis not present

## 2016-02-28 DIAGNOSIS — S82141A Displaced bicondylar fracture of right tibia, initial encounter for closed fracture: Secondary | ICD-10-CM | POA: Diagnosis not present

## 2016-02-28 DIAGNOSIS — R269 Unspecified abnormalities of gait and mobility: Secondary | ICD-10-CM | POA: Diagnosis not present

## 2016-02-28 DIAGNOSIS — Z7901 Long term (current) use of anticoagulants: Secondary | ICD-10-CM | POA: Diagnosis not present

## 2016-02-28 DIAGNOSIS — Z79899 Other long term (current) drug therapy: Secondary | ICD-10-CM | POA: Diagnosis not present

## 2016-03-01 DIAGNOSIS — M7989 Other specified soft tissue disorders: Secondary | ICD-10-CM | POA: Diagnosis not present

## 2016-03-01 DIAGNOSIS — M25461 Effusion, right knee: Secondary | ICD-10-CM | POA: Diagnosis not present

## 2016-03-01 DIAGNOSIS — S82141A Displaced bicondylar fracture of right tibia, initial encounter for closed fracture: Secondary | ICD-10-CM | POA: Diagnosis not present

## 2016-03-01 DIAGNOSIS — M25561 Pain in right knee: Secondary | ICD-10-CM | POA: Diagnosis not present

## 2016-03-08 ENCOUNTER — Ambulatory Visit (INDEPENDENT_AMBULATORY_CARE_PROVIDER_SITE_OTHER): Payer: Medicare Other | Admitting: Pharmacist

## 2016-03-08 DIAGNOSIS — I63132 Cerebral infarction due to embolism of left carotid artery: Secondary | ICD-10-CM

## 2016-03-08 DIAGNOSIS — Z5181 Encounter for therapeutic drug level monitoring: Secondary | ICD-10-CM

## 2016-03-08 DIAGNOSIS — H5213 Myopia, bilateral: Secondary | ICD-10-CM | POA: Diagnosis not present

## 2016-03-08 DIAGNOSIS — H25013 Cortical age-related cataract, bilateral: Secondary | ICD-10-CM | POA: Diagnosis not present

## 2016-03-08 DIAGNOSIS — H2513 Age-related nuclear cataract, bilateral: Secondary | ICD-10-CM | POA: Diagnosis not present

## 2016-03-08 DIAGNOSIS — H524 Presbyopia: Secondary | ICD-10-CM | POA: Diagnosis not present

## 2016-03-08 LAB — POCT INR: INR: 3.8

## 2016-03-09 ENCOUNTER — Telehealth: Payer: Self-pay | Admitting: Internal Medicine

## 2016-03-09 DIAGNOSIS — S82141D Displaced bicondylar fracture of right tibia, subsequent encounter for closed fracture with routine healing: Secondary | ICD-10-CM | POA: Diagnosis not present

## 2016-03-09 DIAGNOSIS — G8929 Other chronic pain: Secondary | ICD-10-CM | POA: Diagnosis not present

## 2016-03-09 DIAGNOSIS — M25561 Pain in right knee: Secondary | ICD-10-CM | POA: Diagnosis not present

## 2016-03-09 NOTE — Telephone Encounter (Signed)
°  New Prob   Daughter states pt is not able to get his PT-INR checked frequently. Requesting to possibly switch his blood thinner to different med that does not require frequent checks. Please call.

## 2016-03-12 NOTE — Telephone Encounter (Signed)
Will route to Dr Rayann Heman for recommendations of which medication he feels is best

## 2016-03-13 NOTE — Telephone Encounter (Signed)
He appears to be on phenytoin which could interact with the DOACs.  He may be best to stay on warfarin while on dilantin.

## 2016-03-17 NOTE — Telephone Encounter (Signed)
Spoke with patient's daughter and gave Dr Jackalyn Lombard recommendations

## 2016-03-17 NOTE — Telephone Encounter (Signed)
Follow up      Daughter is calling to see if Dr Rayann Heman said it was ok to stop coumadin and switch to something else because of the difficulty of getting his pt/inr's checked.   Please call before you leave today. He has a pt/inr appt soon.

## 2016-03-19 ENCOUNTER — Ambulatory Visit (INDEPENDENT_AMBULATORY_CARE_PROVIDER_SITE_OTHER): Payer: Medicare Other | Admitting: *Deleted

## 2016-03-19 DIAGNOSIS — I4892 Unspecified atrial flutter: Secondary | ICD-10-CM | POA: Diagnosis not present

## 2016-03-19 DIAGNOSIS — Z5181 Encounter for therapeutic drug level monitoring: Secondary | ICD-10-CM

## 2016-03-19 DIAGNOSIS — I63132 Cerebral infarction due to embolism of left carotid artery: Secondary | ICD-10-CM

## 2016-03-19 LAB — POCT INR: INR: 3.5

## 2016-03-25 DIAGNOSIS — S82141D Displaced bicondylar fracture of right tibia, subsequent encounter for closed fracture with routine healing: Secondary | ICD-10-CM | POA: Diagnosis not present

## 2016-03-25 DIAGNOSIS — M25561 Pain in right knee: Secondary | ICD-10-CM | POA: Diagnosis not present

## 2016-03-29 DIAGNOSIS — M6281 Muscle weakness (generalized): Secondary | ICD-10-CM | POA: Diagnosis not present

## 2016-03-29 DIAGNOSIS — R269 Unspecified abnormalities of gait and mobility: Secondary | ICD-10-CM | POA: Diagnosis not present

## 2016-03-29 DIAGNOSIS — S82109S Unspecified fracture of upper end of unspecified tibia, sequela: Secondary | ICD-10-CM | POA: Diagnosis not present

## 2016-03-31 DIAGNOSIS — M6281 Muscle weakness (generalized): Secondary | ICD-10-CM | POA: Diagnosis not present

## 2016-03-31 DIAGNOSIS — D72819 Decreased white blood cell count, unspecified: Secondary | ICD-10-CM

## 2016-03-31 DIAGNOSIS — R509 Fever, unspecified: Secondary | ICD-10-CM | POA: Diagnosis not present

## 2016-03-31 DIAGNOSIS — D72829 Elevated white blood cell count, unspecified: Secondary | ICD-10-CM | POA: Diagnosis not present

## 2016-03-31 DIAGNOSIS — Z7901 Long term (current) use of anticoagulants: Secondary | ICD-10-CM | POA: Diagnosis not present

## 2016-03-31 DIAGNOSIS — Z681 Body mass index (BMI) 19 or less, adult: Secondary | ICD-10-CM | POA: Diagnosis not present

## 2016-03-31 DIAGNOSIS — G40909 Epilepsy, unspecified, not intractable, without status epilepticus: Secondary | ICD-10-CM | POA: Diagnosis not present

## 2016-03-31 DIAGNOSIS — R791 Abnormal coagulation profile: Secondary | ICD-10-CM | POA: Diagnosis present

## 2016-03-31 DIAGNOSIS — D649 Anemia, unspecified: Secondary | ICD-10-CM | POA: Diagnosis not present

## 2016-03-31 DIAGNOSIS — E46 Unspecified protein-calorie malnutrition: Secondary | ICD-10-CM | POA: Diagnosis not present

## 2016-03-31 DIAGNOSIS — I69951 Hemiplegia and hemiparesis following unspecified cerebrovascular disease affecting right dominant side: Secondary | ICD-10-CM | POA: Diagnosis not present

## 2016-03-31 DIAGNOSIS — Z79899 Other long term (current) drug therapy: Secondary | ICD-10-CM | POA: Diagnosis not present

## 2016-03-31 DIAGNOSIS — E876 Hypokalemia: Secondary | ICD-10-CM | POA: Diagnosis not present

## 2016-03-31 DIAGNOSIS — R262 Difficulty in walking, not elsewhere classified: Secondary | ICD-10-CM | POA: Diagnosis not present

## 2016-03-31 DIAGNOSIS — S82109S Unspecified fracture of upper end of unspecified tibia, sequela: Secondary | ICD-10-CM | POA: Diagnosis not present

## 2016-03-31 DIAGNOSIS — G4089 Other seizures: Secondary | ICD-10-CM | POA: Diagnosis not present

## 2016-03-31 DIAGNOSIS — I959 Hypotension, unspecified: Secondary | ICD-10-CM | POA: Diagnosis not present

## 2016-03-31 DIAGNOSIS — N390 Urinary tract infection, site not specified: Secondary | ICD-10-CM | POA: Insufficient documentation

## 2016-03-31 DIAGNOSIS — N3000 Acute cystitis without hematuria: Secondary | ICD-10-CM | POA: Diagnosis not present

## 2016-03-31 DIAGNOSIS — R269 Unspecified abnormalities of gait and mobility: Secondary | ICD-10-CM | POA: Diagnosis not present

## 2016-03-31 DIAGNOSIS — E43 Unspecified severe protein-calorie malnutrition: Secondary | ICD-10-CM | POA: Diagnosis not present

## 2016-03-31 DIAGNOSIS — M25561 Pain in right knee: Secondary | ICD-10-CM | POA: Diagnosis not present

## 2016-03-31 DIAGNOSIS — R627 Adult failure to thrive: Secondary | ICD-10-CM | POA: Diagnosis not present

## 2016-03-31 DIAGNOSIS — R569 Unspecified convulsions: Secondary | ICD-10-CM | POA: Diagnosis not present

## 2016-03-31 DIAGNOSIS — I639 Cerebral infarction, unspecified: Secondary | ICD-10-CM | POA: Diagnosis not present

## 2016-03-31 DIAGNOSIS — R718 Other abnormality of red blood cells: Secondary | ICD-10-CM | POA: Diagnosis not present

## 2016-04-01 DIAGNOSIS — I959 Hypotension, unspecified: Secondary | ICD-10-CM | POA: Insufficient documentation

## 2016-04-03 DIAGNOSIS — E43 Unspecified severe protein-calorie malnutrition: Secondary | ICD-10-CM | POA: Insufficient documentation

## 2016-04-06 DIAGNOSIS — I639 Cerebral infarction, unspecified: Secondary | ICD-10-CM | POA: Diagnosis not present

## 2016-04-06 DIAGNOSIS — E43 Unspecified severe protein-calorie malnutrition: Secondary | ICD-10-CM | POA: Diagnosis not present

## 2016-04-06 DIAGNOSIS — D72829 Elevated white blood cell count, unspecified: Secondary | ICD-10-CM | POA: Diagnosis not present

## 2016-04-06 DIAGNOSIS — G40909 Epilepsy, unspecified, not intractable, without status epilepticus: Secondary | ICD-10-CM | POA: Diagnosis not present

## 2016-04-06 DIAGNOSIS — G4089 Other seizures: Secondary | ICD-10-CM | POA: Diagnosis not present

## 2016-04-06 DIAGNOSIS — M25561 Pain in right knee: Secondary | ICD-10-CM | POA: Diagnosis not present

## 2016-04-06 DIAGNOSIS — M6281 Muscle weakness (generalized): Secondary | ICD-10-CM | POA: Diagnosis not present

## 2016-04-06 DIAGNOSIS — I959 Hypotension, unspecified: Secondary | ICD-10-CM | POA: Diagnosis not present

## 2016-04-06 DIAGNOSIS — D649 Anemia, unspecified: Secondary | ICD-10-CM | POA: Diagnosis not present

## 2016-04-06 DIAGNOSIS — Z7901 Long term (current) use of anticoagulants: Secondary | ICD-10-CM | POA: Diagnosis not present

## 2016-04-06 DIAGNOSIS — E46 Unspecified protein-calorie malnutrition: Secondary | ICD-10-CM | POA: Diagnosis not present

## 2016-04-06 DIAGNOSIS — R627 Adult failure to thrive: Secondary | ICD-10-CM | POA: Diagnosis not present

## 2016-04-06 DIAGNOSIS — N390 Urinary tract infection, site not specified: Secondary | ICD-10-CM | POA: Diagnosis not present

## 2016-04-06 DIAGNOSIS — E876 Hypokalemia: Secondary | ICD-10-CM | POA: Diagnosis not present

## 2016-04-06 DIAGNOSIS — R197 Diarrhea, unspecified: Secondary | ICD-10-CM | POA: Diagnosis not present

## 2016-04-06 DIAGNOSIS — R262 Difficulty in walking, not elsewhere classified: Secondary | ICD-10-CM | POA: Diagnosis not present

## 2016-04-07 DIAGNOSIS — E43 Unspecified severe protein-calorie malnutrition: Secondary | ICD-10-CM | POA: Diagnosis not present

## 2016-04-07 DIAGNOSIS — D649 Anemia, unspecified: Secondary | ICD-10-CM | POA: Diagnosis not present

## 2016-04-07 DIAGNOSIS — R627 Adult failure to thrive: Secondary | ICD-10-CM | POA: Diagnosis not present

## 2016-04-07 DIAGNOSIS — G40909 Epilepsy, unspecified, not intractable, without status epilepticus: Secondary | ICD-10-CM | POA: Diagnosis not present

## 2016-04-07 DIAGNOSIS — N390 Urinary tract infection, site not specified: Secondary | ICD-10-CM | POA: Diagnosis not present

## 2016-04-07 DIAGNOSIS — I959 Hypotension, unspecified: Secondary | ICD-10-CM | POA: Diagnosis not present

## 2016-04-14 DIAGNOSIS — R197 Diarrhea, unspecified: Secondary | ICD-10-CM | POA: Diagnosis not present

## 2016-04-16 DIAGNOSIS — N390 Urinary tract infection, site not specified: Secondary | ICD-10-CM | POA: Diagnosis not present

## 2016-04-16 DIAGNOSIS — M6281 Muscle weakness (generalized): Secondary | ICD-10-CM | POA: Diagnosis not present

## 2016-04-26 ENCOUNTER — Ambulatory Visit (INDEPENDENT_AMBULATORY_CARE_PROVIDER_SITE_OTHER): Payer: Medicare Other

## 2016-04-26 ENCOUNTER — Telehealth: Payer: Self-pay | Admitting: Internal Medicine

## 2016-04-26 DIAGNOSIS — Z5181 Encounter for therapeutic drug level monitoring: Secondary | ICD-10-CM

## 2016-04-26 DIAGNOSIS — I4892 Unspecified atrial flutter: Secondary | ICD-10-CM

## 2016-04-26 DIAGNOSIS — S82109S Unspecified fracture of upper end of unspecified tibia, sequela: Secondary | ICD-10-CM | POA: Diagnosis not present

## 2016-04-26 DIAGNOSIS — R269 Unspecified abnormalities of gait and mobility: Secondary | ICD-10-CM | POA: Diagnosis not present

## 2016-04-26 DIAGNOSIS — M6281 Muscle weakness (generalized): Secondary | ICD-10-CM | POA: Diagnosis not present

## 2016-04-26 LAB — POCT INR: INR: 2.9

## 2016-04-26 NOTE — Telephone Encounter (Signed)
New Message   Nurse calling to provide PTINR.

## 2016-04-26 NOTE — Telephone Encounter (Signed)
I am sorry I believe you have sent this to the wrong person. This is not a patient of [employer].

## 2016-04-27 DIAGNOSIS — S82109S Unspecified fracture of upper end of unspecified tibia, sequela: Secondary | ICD-10-CM | POA: Diagnosis not present

## 2016-04-27 DIAGNOSIS — R269 Unspecified abnormalities of gait and mobility: Secondary | ICD-10-CM | POA: Diagnosis not present

## 2016-04-27 DIAGNOSIS — M6281 Muscle weakness (generalized): Secondary | ICD-10-CM | POA: Diagnosis not present

## 2016-04-29 DIAGNOSIS — M1711 Unilateral primary osteoarthritis, right knee: Secondary | ICD-10-CM | POA: Diagnosis not present

## 2016-04-29 DIAGNOSIS — M25561 Pain in right knee: Secondary | ICD-10-CM | POA: Diagnosis not present

## 2016-04-29 DIAGNOSIS — R269 Unspecified abnormalities of gait and mobility: Secondary | ICD-10-CM | POA: Diagnosis not present

## 2016-04-29 DIAGNOSIS — S82131D Displaced fracture of medial condyle of right tibia, subsequent encounter for closed fracture with routine healing: Secondary | ICD-10-CM | POA: Diagnosis not present

## 2016-04-29 DIAGNOSIS — M6281 Muscle weakness (generalized): Secondary | ICD-10-CM | POA: Diagnosis not present

## 2016-04-29 DIAGNOSIS — S82109S Unspecified fracture of upper end of unspecified tibia, sequela: Secondary | ICD-10-CM | POA: Diagnosis not present

## 2016-05-04 DIAGNOSIS — M6281 Muscle weakness (generalized): Secondary | ICD-10-CM | POA: Diagnosis not present

## 2016-05-04 DIAGNOSIS — S82109S Unspecified fracture of upper end of unspecified tibia, sequela: Secondary | ICD-10-CM | POA: Diagnosis not present

## 2016-05-04 DIAGNOSIS — R269 Unspecified abnormalities of gait and mobility: Secondary | ICD-10-CM | POA: Diagnosis not present

## 2016-05-06 ENCOUNTER — Ambulatory Visit (INDEPENDENT_AMBULATORY_CARE_PROVIDER_SITE_OTHER): Payer: Medicare Other | Admitting: Cardiology

## 2016-05-06 DIAGNOSIS — I4892 Unspecified atrial flutter: Secondary | ICD-10-CM

## 2016-05-06 DIAGNOSIS — S82109S Unspecified fracture of upper end of unspecified tibia, sequela: Secondary | ICD-10-CM | POA: Diagnosis not present

## 2016-05-06 DIAGNOSIS — M6281 Muscle weakness (generalized): Secondary | ICD-10-CM | POA: Diagnosis not present

## 2016-05-06 DIAGNOSIS — Z5181 Encounter for therapeutic drug level monitoring: Secondary | ICD-10-CM

## 2016-05-06 DIAGNOSIS — R269 Unspecified abnormalities of gait and mobility: Secondary | ICD-10-CM | POA: Diagnosis not present

## 2016-05-06 LAB — POCT INR: INR: 2.7

## 2016-05-10 DIAGNOSIS — M1711 Unilateral primary osteoarthritis, right knee: Secondary | ICD-10-CM | POA: Diagnosis not present

## 2016-05-11 DIAGNOSIS — S82109S Unspecified fracture of upper end of unspecified tibia, sequela: Secondary | ICD-10-CM | POA: Diagnosis not present

## 2016-05-11 DIAGNOSIS — R269 Unspecified abnormalities of gait and mobility: Secondary | ICD-10-CM | POA: Diagnosis not present

## 2016-05-11 DIAGNOSIS — M6281 Muscle weakness (generalized): Secondary | ICD-10-CM | POA: Diagnosis not present

## 2016-05-17 DIAGNOSIS — M1711 Unilateral primary osteoarthritis, right knee: Secondary | ICD-10-CM | POA: Diagnosis not present

## 2016-05-17 DIAGNOSIS — M25561 Pain in right knee: Secondary | ICD-10-CM | POA: Diagnosis not present

## 2016-05-18 DIAGNOSIS — M6281 Muscle weakness (generalized): Secondary | ICD-10-CM | POA: Diagnosis not present

## 2016-05-18 DIAGNOSIS — S82109S Unspecified fracture of upper end of unspecified tibia, sequela: Secondary | ICD-10-CM | POA: Diagnosis not present

## 2016-05-18 DIAGNOSIS — R269 Unspecified abnormalities of gait and mobility: Secondary | ICD-10-CM | POA: Diagnosis not present

## 2016-05-20 ENCOUNTER — Ambulatory Visit (INDEPENDENT_AMBULATORY_CARE_PROVIDER_SITE_OTHER): Payer: Medicare Other | Admitting: Cardiovascular Disease

## 2016-05-20 DIAGNOSIS — Z5181 Encounter for therapeutic drug level monitoring: Secondary | ICD-10-CM

## 2016-05-20 DIAGNOSIS — M6281 Muscle weakness (generalized): Secondary | ICD-10-CM | POA: Diagnosis not present

## 2016-05-20 DIAGNOSIS — S82109S Unspecified fracture of upper end of unspecified tibia, sequela: Secondary | ICD-10-CM | POA: Diagnosis not present

## 2016-05-20 DIAGNOSIS — I4892 Unspecified atrial flutter: Secondary | ICD-10-CM

## 2016-05-20 DIAGNOSIS — R269 Unspecified abnormalities of gait and mobility: Secondary | ICD-10-CM | POA: Diagnosis not present

## 2016-05-20 LAB — POCT INR: INR: 2

## 2016-05-24 DIAGNOSIS — M1711 Unilateral primary osteoarthritis, right knee: Secondary | ICD-10-CM | POA: Diagnosis not present

## 2016-06-18 ENCOUNTER — Ambulatory Visit (INDEPENDENT_AMBULATORY_CARE_PROVIDER_SITE_OTHER): Payer: Medicare Other | Admitting: Pharmacist

## 2016-06-18 DIAGNOSIS — Z5181 Encounter for therapeutic drug level monitoring: Secondary | ICD-10-CM | POA: Diagnosis not present

## 2016-06-18 DIAGNOSIS — I4892 Unspecified atrial flutter: Secondary | ICD-10-CM | POA: Diagnosis not present

## 2016-06-18 LAB — POCT INR: INR: 3.3

## 2016-06-18 MED ORDER — WARFARIN SODIUM 2.5 MG PO TABS: ORAL_TABLET | ORAL | 3 refills | Status: DC

## 2016-07-02 DIAGNOSIS — I6930 Unspecified sequelae of cerebral infarction: Secondary | ICD-10-CM | POA: Diagnosis not present

## 2016-07-02 DIAGNOSIS — E46 Unspecified protein-calorie malnutrition: Secondary | ICD-10-CM | POA: Diagnosis not present

## 2016-07-02 DIAGNOSIS — D649 Anemia, unspecified: Secondary | ICD-10-CM | POA: Diagnosis not present

## 2016-07-02 DIAGNOSIS — G40909 Epilepsy, unspecified, not intractable, without status epilepticus: Secondary | ICD-10-CM | POA: Diagnosis not present

## 2016-07-02 DIAGNOSIS — Z79899 Other long term (current) drug therapy: Secondary | ICD-10-CM | POA: Diagnosis not present

## 2016-07-02 DIAGNOSIS — M791 Myalgia: Secondary | ICD-10-CM | POA: Diagnosis not present

## 2016-07-04 ENCOUNTER — Other Ambulatory Visit: Payer: Self-pay | Admitting: Neurology

## 2016-07-04 DIAGNOSIS — G40209 Localization-related (focal) (partial) symptomatic epilepsy and epileptic syndromes with complex partial seizures, not intractable, without status epilepticus: Secondary | ICD-10-CM

## 2016-07-06 ENCOUNTER — Telehealth: Payer: Self-pay

## 2016-07-06 NOTE — Telephone Encounter (Signed)
Clld [facility] [place] [facility] - spoke to [place]  [facility]sd Medication refill for Phenytoin ER 100 mg was processed on Monday, 07/05/16 and electronically sent to [facility].  Asked if she could pull Rx from there to [facility] location - she was able to.  She will contact pt once filled.

## 2016-07-12 ENCOUNTER — Ambulatory Visit: Payer: Medicare Other | Admitting: Neurology

## 2016-07-15 ENCOUNTER — Ambulatory Visit (INDEPENDENT_AMBULATORY_CARE_PROVIDER_SITE_OTHER): Payer: Medicare Other | Admitting: Neurology

## 2016-07-15 ENCOUNTER — Encounter: Payer: Self-pay | Admitting: Neurology

## 2016-07-15 ENCOUNTER — Ambulatory Visit (INDEPENDENT_AMBULATORY_CARE_PROVIDER_SITE_OTHER): Payer: Medicare Other | Admitting: Pharmacist

## 2016-07-15 VITALS — BP 110/58 | HR 103 | Temp 98.6°F | Ht 69.0 in | Wt 128.0 lb

## 2016-07-15 DIAGNOSIS — I4892 Unspecified atrial flutter: Secondary | ICD-10-CM

## 2016-07-15 DIAGNOSIS — F03A0 Unspecified dementia, mild, without behavioral disturbance, psychotic disturbance, mood disturbance, and anxiety: Secondary | ICD-10-CM

## 2016-07-15 DIAGNOSIS — F0390 Unspecified dementia without behavioral disturbance: Secondary | ICD-10-CM | POA: Diagnosis not present

## 2016-07-15 DIAGNOSIS — Z5181 Encounter for therapeutic drug level monitoring: Secondary | ICD-10-CM

## 2016-07-15 DIAGNOSIS — G40209 Localization-related (focal) (partial) symptomatic epilepsy and epileptic syndromes with complex partial seizures, not intractable, without status epilepticus: Secondary | ICD-10-CM | POA: Diagnosis not present

## 2016-07-15 LAB — POCT INR: INR: 5.1

## 2016-07-15 MED ORDER — LEVETIRACETAM 500 MG PO TABS: ORAL_TABLET | ORAL | 11 refills | Status: DC

## 2016-07-15 MED ORDER — PHENYTOIN SODIUM EXTENDED 100 MG PO CAPS: 300.0000 mg | ORAL_CAPSULE | Freq: Every day | ORAL | 11 refills | Status: DC
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
## 2016-07-15 NOTE — Patient Instructions (Addendum)
1. Continue Keppra 500mg : Take 3 tablets twice a day 2. Continue Dilantin 100mg : Take 3 capsules daily 3. Continue with home health care daily 4. No further driving 5. Follow-up in 4 months, call for any changes  Seizure Precatuions: 1. If medication has been prescribed for you to prevent seizures, take it exactly as directed.  Do not stop taking the medicine without talking to your doctor first, even if you have not had a seizure in a long time.   2. Avoid activities in which a seizure would cause danger to yourself or to others.  Don't operate dangerous machinery, swim alone, or climb in high or dangerous places, such as on ladders, roofs, or girders.  Do not drive unless your doctor says you may.  3. If you have any warning that you may have a seizure, lay down in a safe place where you can't hurt yourself.    4.  No driving for 6 months from last seizure, as per [facility].   Please refer to the following link on the [place] website for more information: http://www.epilepsyfoundation.org/answerplace/Social/driving/drivingu.cfm   5.  Maintain good sleep hygiene. Avoid alcohol.  6.  Contact your doctor if you have any problems that may be related to the medicine you are taking.  7.  Call 911 and bring the patient back to the ED if:        A.  The seizure lasts longer than 5 minutes.       B.  The patient doesn't awaken shortly after the seizure  C.  The patient has new problems such as difficulty seeing, speaking or moving  D.  The patient was injured during the seizure  E.  The patient has a temperature over 102 F (39C)  F.  The patient vomited and now is having trouble breathing

## 2016-07-16 DIAGNOSIS — M353 Polymyalgia rheumatica: Secondary | ICD-10-CM | POA: Diagnosis not present

## 2016-07-16 DIAGNOSIS — R634 Abnormal weight loss: Secondary | ICD-10-CM | POA: Diagnosis not present

## 2016-07-16 DIAGNOSIS — D649 Anemia, unspecified: Secondary | ICD-10-CM | POA: Diagnosis not present

## 2016-07-16 DIAGNOSIS — D509 Iron deficiency anemia, unspecified: Secondary | ICD-10-CM | POA: Diagnosis not present

## 2016-07-19 ENCOUNTER — Telehealth: Payer: Self-pay | Admitting: *Deleted

## 2016-07-19 NOTE — Telephone Encounter (Signed)
Pt's dtr, [name], called to inform [facility] that his Prednsione 20mg  daily will continue as a daily dose. She stated that she spoke with Dr. Felipa Eth on Friday & he told her that he will keep the pt on the dose & will decrease the dose of Prednisone in the future, but not at this time.  The dtr is aware that we will check INR on Thursday to see where INR is & dose accordingly at appt.

## 2016-07-22 ENCOUNTER — Ambulatory Visit (INDEPENDENT_AMBULATORY_CARE_PROVIDER_SITE_OTHER): Payer: Medicare Other | Admitting: *Deleted

## 2016-07-22 DIAGNOSIS — Z5181 Encounter for therapeutic drug level monitoring: Secondary | ICD-10-CM

## 2016-07-22 DIAGNOSIS — I4892 Unspecified atrial flutter: Secondary | ICD-10-CM | POA: Diagnosis not present

## 2016-07-22 DIAGNOSIS — D649 Anemia, unspecified: Secondary | ICD-10-CM | POA: Diagnosis not present

## 2016-07-22 LAB — POCT INR: INR: 2.4

## 2016-07-29 ENCOUNTER — Ambulatory Visit (INDEPENDENT_AMBULATORY_CARE_PROVIDER_SITE_OTHER): Payer: Medicare Other | Admitting: *Deleted

## 2016-07-29 DIAGNOSIS — I4892 Unspecified atrial flutter: Secondary | ICD-10-CM | POA: Diagnosis not present

## 2016-07-29 DIAGNOSIS — Z5181 Encounter for therapeutic drug level monitoring: Secondary | ICD-10-CM | POA: Diagnosis not present

## 2016-07-29 LAB — POCT INR: INR: 2.7

## 2016-08-12 ENCOUNTER — Ambulatory Visit (INDEPENDENT_AMBULATORY_CARE_PROVIDER_SITE_OTHER): Payer: Medicare Other | Admitting: *Deleted

## 2016-08-12 DIAGNOSIS — D649 Anemia, unspecified: Secondary | ICD-10-CM | POA: Diagnosis not present

## 2016-08-12 DIAGNOSIS — I4892 Unspecified atrial flutter: Secondary | ICD-10-CM | POA: Diagnosis not present

## 2016-08-12 DIAGNOSIS — Z5181 Encounter for therapeutic drug level monitoring: Secondary | ICD-10-CM

## 2016-08-12 LAB — POCT INR: INR: 2.2

## 2016-08-18 ENCOUNTER — Encounter: Payer: Self-pay | Admitting: Internal Medicine

## 2016-09-02 ENCOUNTER — Ambulatory Visit (INDEPENDENT_AMBULATORY_CARE_PROVIDER_SITE_OTHER): Payer: Medicare Other | Admitting: Pharmacist

## 2016-09-02 DIAGNOSIS — Z5181 Encounter for therapeutic drug level monitoring: Secondary | ICD-10-CM | POA: Diagnosis not present

## 2016-09-02 DIAGNOSIS — I4892 Unspecified atrial flutter: Secondary | ICD-10-CM | POA: Diagnosis not present

## 2016-09-02 LAB — POCT INR: INR: 2.9

## 2016-09-03 DIAGNOSIS — E46 Unspecified protein-calorie malnutrition: Secondary | ICD-10-CM | POA: Diagnosis not present

## 2016-09-03 DIAGNOSIS — I10 Essential (primary) hypertension: Secondary | ICD-10-CM | POA: Diagnosis not present

## 2016-09-03 DIAGNOSIS — M353 Polymyalgia rheumatica: Secondary | ICD-10-CM | POA: Diagnosis not present

## 2016-09-06 ENCOUNTER — Ambulatory Visit (INDEPENDENT_AMBULATORY_CARE_PROVIDER_SITE_OTHER): Payer: Medicare Other | Admitting: Internal Medicine

## 2016-09-06 ENCOUNTER — Encounter: Payer: Self-pay | Admitting: Internal Medicine

## 2016-09-06 VITALS — BP 120/62 | HR 98 | Ht 72.0 in | Wt 135.2 lb

## 2016-09-06 DIAGNOSIS — I483 Typical atrial flutter: Secondary | ICD-10-CM

## 2016-09-06 NOTE — Progress Notes (Signed)
   PCP: [name], MD  [name] is a 76 y.o. male who presents today for routine electrophysiology followup.  Since last being seen in our clinic, the patient reports doing reasonably well.  Today, he denies symptoms of palpitations, chest pain, shortness of breath,  lower extremity edema, dizziness, presyncope, or syncope.  The patient is otherwise without complaint today.   Past Medical History:  Diagnosis Date  . Hiatal hernia   . HTN (hypertension)    pt denies h/o HTN though it appears he was started on spironolactone during his hospitalization 3/13  . Pancreatitis   . Seizures ([place])   . Stroke ([place])   . Typical atrial flutter ([place]) 3/13   Past Surgical History:  Procedure Laterality Date  . ESOPHAGOGASTRODUODENOSCOPY N/A 11/30/2015   Procedure: ESOPHAGOGASTRODUODENOSCOPY (EGD);  Surgeon: Wonda Horner, MD;  Location: [facility] ENDOSCOPY;  Service: Endoscopy;  Laterality: N/A;  . no surgical history  06/2011    ROS- all systems are reviewed and negatives except as per HPI above  Current Outpatient Prescriptions  Medication Sig Dispense Refill  . levETIRAcetam (KEPPRA) 500 MG tablet Take 3 tablets twice a day 180 tablet 11  . phenytoin (DILANTIN) 100 MG ER capsule Take 3 capsules (300 mg total) by mouth daily. 90 capsule 11  . predniSONE (DELTASONE) 20 MG tablet Take 10-20 mg by mouth as directed. Alternating doses    . warfarin (COUMADIN) 2.5 MG tablet Take 1-2 tablets by mouth daily as directed by coumadin clinic 60 tablet 3   No current facility-administered medications for this visit.     Physical Exam: Vitals:   09/06/16 1506  BP: 120/62  Pulse: 98  SpO2: 99%  Weight: 135 lb 3.2 oz (61.3 kg)  Height: 6' (1.829 m)    GEN- The patient is well appearing, alert and oriented x 3 today.   Head- normocephalic, atraumatic Eyes-  Sclera clear, conjunctiva pink Ears- hearing intact Oropharynx- clear Lungs- Clear to ausculation bilaterally, normal work of  breathing Heart- Regular rate and rhythm, no murmurs, rubs or gallops, PMI not laterally displaced GI- soft, NT, ND, + BS Extremities- no clubbing, cyanosis, or edema  EKG tracing ordered today is personally reviewed and shows sinus rhythm 98 bpm, PR 238 msec, incomplete RBBB  Assessment and Plan:  1. Atrial flutter Well controlled He is not interested in ablation.  Given LA enlargement, I worry that he is also at risk for afib even if atrial flutter is ablated.  I think that long term anticoagulation is best given prior stroke. As he is on phenytoin, he is not a candidate for NOAC therapy He should follow closely in the anticoagulation clinic  Continue coumadin  Return in 1 year to see EP NP   [name] MD, [facility] 09/06/2016 3:17 PM

## 2016-09-06 NOTE — Patient Instructions (Addendum)
Medication Instructions:  Your physician recommends that you continue on your current medications as directed. Please refer to the Current Medication list given to you today.    Labwork: None ordered   Testing/Procedures: None ordered'  Follow-Up: Your physician wants you to follow-up in: 12 months with Amber Seiler, NP. You will receive a reminder letter in the mail two months in advance. If you don't receive a letter, please call our office to schedule the follow-up appointment.   Any Other Special Instructions Will Be Listed Below (If Applicable).     If you need a refill on your cardiac medications before your next appointment, please call your pharmacy.   

## 2016-10-14 ENCOUNTER — Ambulatory Visit (INDEPENDENT_AMBULATORY_CARE_PROVIDER_SITE_OTHER): Payer: Medicare Other | Admitting: *Deleted

## 2016-10-14 DIAGNOSIS — I4892 Unspecified atrial flutter: Secondary | ICD-10-CM

## 2016-10-14 DIAGNOSIS — Z5181 Encounter for therapeutic drug level monitoring: Secondary | ICD-10-CM | POA: Diagnosis not present

## 2016-10-14 LAB — POCT INR: INR: 2.2

## 2016-11-10 ENCOUNTER — Other Ambulatory Visit: Payer: Self-pay | Admitting: Internal Medicine

## 2016-11-10 ENCOUNTER — Other Ambulatory Visit: Payer: Self-pay | Admitting: *Deleted

## 2016-11-10 MED ORDER — WARFARIN SODIUM 2.5 MG PO TABS: ORAL_TABLET | ORAL | 3 refills | Status: DC

## 2016-11-12 ENCOUNTER — Encounter: Payer: Self-pay | Admitting: Neurology

## 2016-11-12 ENCOUNTER — Ambulatory Visit (INDEPENDENT_AMBULATORY_CARE_PROVIDER_SITE_OTHER): Payer: Medicare Other | Admitting: Neurology

## 2016-11-12 DIAGNOSIS — I10 Essential (primary) hypertension: Secondary | ICD-10-CM | POA: Diagnosis not present

## 2016-11-12 DIAGNOSIS — G40209 Localization-related (focal) (partial) symptomatic epilepsy and epileptic syndromes with complex partial seizures, not intractable, without status epilepticus: Secondary | ICD-10-CM | POA: Diagnosis not present

## 2016-11-12 DIAGNOSIS — M353 Polymyalgia rheumatica: Secondary | ICD-10-CM | POA: Diagnosis not present

## 2016-11-12 DIAGNOSIS — E46 Unspecified protein-calorie malnutrition: Secondary | ICD-10-CM | POA: Diagnosis not present

## 2016-11-12 DIAGNOSIS — G40909 Epilepsy, unspecified, not intractable, without status epilepticus: Secondary | ICD-10-CM | POA: Diagnosis not present

## 2016-11-12 MED ORDER — PHENYTOIN SODIUM EXTENDED 100 MG PO CAPS: 300.0000 mg | ORAL_CAPSULE | Freq: Every day | ORAL | 11 refills | Status: DC

## 2016-11-12 MED ORDER — LEVETIRACETAM 500 MG PO TABS: ORAL_TABLET | ORAL | 11 refills | Status: DC

## 2016-11-12 NOTE — Patient Instructions (Signed)
1. Continue current medications 2. Follow-up in 6 months, call for any changes  Seizure Precautions: 1. If medication has been prescribed for you to prevent seizures, take it exactly as directed.  Do not stop taking the medicine without talking to your doctor first, even if you have not had a seizure in a long time.   2. Avoid activities in which a seizure would cause danger to yourself or to others.  Don't operate dangerous machinery, swim alone, or climb in high or dangerous places, such as on ladders, roofs, or girders.  Do not drive unless your doctor says you may.  3. If you have any warning that you may have a seizure, lay down in a safe place where you can't hurt yourself.    4.  No driving for 6 months from last seizure, as per [facility].   Please refer to the following link on the [place] website for more information: http://www.epilepsyfoundation.org/answerplace/Social/driving/drivingu.cfm   5.  Maintain good sleep hygiene. Avoid alcohol.  6.  Contact your doctor if you have any problems that may be related to the medicine you are taking.  7.  Call 911 and bring the patient back to the ED if:        A.  The seizure lasts longer than 5 minutes.       B.  The patient doesn't awaken shortly after the seizure  C.  The patient has new problems such as difficulty seeing, speaking or moving  D.  The patient was injured during the seizure  E.  The patient has a temperature over 102 F (39C)  F.  The patient vomited and now is having trouble breathing
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
## 2016-11-26 ENCOUNTER — Ambulatory Visit (INDEPENDENT_AMBULATORY_CARE_PROVIDER_SITE_OTHER): Payer: Medicare Other | Admitting: *Deleted

## 2016-11-26 DIAGNOSIS — I4892 Unspecified atrial flutter: Secondary | ICD-10-CM | POA: Diagnosis not present

## 2016-11-26 DIAGNOSIS — Z5181 Encounter for therapeutic drug level monitoring: Secondary | ICD-10-CM

## 2016-11-26 LAB — POCT INR: INR: 2.7

## 2016-12-05 ENCOUNTER — Encounter (HOSPITAL_COMMUNITY): Payer: Self-pay | Admitting: Emergency Medicine

## 2016-12-05 ENCOUNTER — Inpatient Hospital Stay (HOSPITAL_COMMUNITY)
Admission: EM | Admit: 2016-12-05 | Discharge: 2016-12-15 | DRG: 480 | Disposition: A | Discharge status: Skilled Nursing Facility | Payer: Medicare Other | Attending: Family Medicine | Admitting: Family Medicine

## 2016-12-05 DIAGNOSIS — I69398 Other sequelae of cerebral infarction: Secondary | ICD-10-CM

## 2016-12-05 DIAGNOSIS — D62 Acute posthemorrhagic anemia: Secondary | ICD-10-CM | POA: Diagnosis not present

## 2016-12-05 DIAGNOSIS — Z8673 Personal history of transient ischemic attack (TIA), and cerebral infarction without residual deficits: Secondary | ICD-10-CM

## 2016-12-05 DIAGNOSIS — E876 Hypokalemia: Secondary | ICD-10-CM | POA: Diagnosis not present

## 2016-12-05 DIAGNOSIS — Z79899 Other long term (current) drug therapy: Secondary | ICD-10-CM

## 2016-12-05 DIAGNOSIS — S72142A Displaced intertrochanteric fracture of left femur, initial encounter for closed fracture: Secondary | ICD-10-CM | POA: Diagnosis not present

## 2016-12-05 DIAGNOSIS — S79911A Unspecified injury of right hip, initial encounter: Secondary | ICD-10-CM | POA: Diagnosis not present

## 2016-12-05 DIAGNOSIS — W010XXA Fall on same level from slipping, tripping and stumbling without subsequent striking against object, initial encounter: Secondary | ICD-10-CM | POA: Diagnosis present

## 2016-12-05 DIAGNOSIS — T420X5A Adverse effect of hydantoin derivatives, initial encounter: Secondary | ICD-10-CM | POA: Diagnosis present

## 2016-12-05 DIAGNOSIS — S72002A Fracture of unspecified part of neck of left femur, initial encounter for closed fracture: Secondary | ICD-10-CM

## 2016-12-05 DIAGNOSIS — T420X1A Poisoning by hydantoin derivatives, accidental (unintentional), initial encounter: Secondary | ICD-10-CM

## 2016-12-05 DIAGNOSIS — L89312 Pressure ulcer of right buttock, stage 2: Secondary | ICD-10-CM | POA: Diagnosis not present

## 2016-12-05 DIAGNOSIS — Y92015 Private garage of single-family (private) house as the place of occurrence of the external cause: Secondary | ICD-10-CM

## 2016-12-05 DIAGNOSIS — Z8249 Family history of ischemic heart disease and other diseases of the circulatory system: Secondary | ICD-10-CM

## 2016-12-05 DIAGNOSIS — I10 Essential (primary) hypertension: Secondary | ICD-10-CM | POA: Diagnosis present

## 2016-12-05 DIAGNOSIS — L8990 Pressure ulcer of unspecified site, unspecified stage: Secondary | ICD-10-CM | POA: Insufficient documentation

## 2016-12-05 DIAGNOSIS — I4891 Unspecified atrial fibrillation: Secondary | ICD-10-CM | POA: Diagnosis not present

## 2016-12-05 DIAGNOSIS — I4892 Unspecified atrial flutter: Secondary | ICD-10-CM | POA: Diagnosis present

## 2016-12-05 DIAGNOSIS — Z681 Body mass index (BMI) 19 or less, adult: Secondary | ICD-10-CM

## 2016-12-05 DIAGNOSIS — S299XXA Unspecified injury of thorax, initial encounter: Secondary | ICD-10-CM | POA: Diagnosis not present

## 2016-12-05 DIAGNOSIS — R079 Chest pain, unspecified: Secondary | ICD-10-CM | POA: Diagnosis not present

## 2016-12-05 DIAGNOSIS — G40209 Localization-related (focal) (partial) symptomatic epilepsy and epileptic syndromes with complex partial seizures, not intractable, without status epilepticus: Secondary | ICD-10-CM | POA: Diagnosis not present

## 2016-12-05 DIAGNOSIS — Z7952 Long term (current) use of systemic steroids: Secondary | ICD-10-CM

## 2016-12-05 DIAGNOSIS — W19XXXA Unspecified fall, initial encounter: Secondary | ICD-10-CM

## 2016-12-05 DIAGNOSIS — G8911 Acute pain due to trauma: Secondary | ICD-10-CM | POA: Diagnosis not present

## 2016-12-05 DIAGNOSIS — T148XXA Other injury of unspecified body region, initial encounter: Secondary | ICD-10-CM

## 2016-12-05 DIAGNOSIS — D684 Acquired coagulation factor deficiency: Secondary | ICD-10-CM | POA: Diagnosis present

## 2016-12-05 DIAGNOSIS — D72829 Elevated white blood cell count, unspecified: Secondary | ICD-10-CM | POA: Diagnosis not present

## 2016-12-05 DIAGNOSIS — D500 Iron deficiency anemia secondary to blood loss (chronic): Secondary | ICD-10-CM | POA: Diagnosis present

## 2016-12-05 DIAGNOSIS — E43 Unspecified severe protein-calorie malnutrition: Secondary | ICD-10-CM | POA: Diagnosis not present

## 2016-12-05 DIAGNOSIS — Z87891 Personal history of nicotine dependence: Secondary | ICD-10-CM

## 2016-12-05 DIAGNOSIS — Z7901 Long term (current) use of anticoagulants: Secondary | ICD-10-CM

## 2016-12-05 DIAGNOSIS — F0390 Unspecified dementia without behavioral disturbance: Secondary | ICD-10-CM | POA: Diagnosis present

## 2016-12-05 DIAGNOSIS — R410 Disorientation, unspecified: Secondary | ICD-10-CM | POA: Diagnosis not present

## 2016-12-05 DIAGNOSIS — D649 Anemia, unspecified: Secondary | ICD-10-CM | POA: Diagnosis present

## 2016-12-05 DIAGNOSIS — I4510 Unspecified right bundle-branch block: Secondary | ICD-10-CM | POA: Diagnosis not present

## 2016-12-05 MED ORDER — FENTANYL CITRATE (PF) 100 MCG/2ML IJ SOLN
25.0000 ug | INTRAMUSCULAR | Status: AC | PRN
  Administered 2016-12-06 (×2): 25 ug via INTRAVENOUS
  Filled 2016-12-05 (×2): qty 2

## 2016-12-05 NOTE — ED Triage Notes (Signed)
BIB [facility]; pt fell on his garage floor after swinging at a bug; landed on L hip.  No LOC, neuro intact; no head injury.  Pt on warfarin.  VSS.

## 2016-12-06 ENCOUNTER — Emergency Department (HOSPITAL_COMMUNITY): Payer: Medicare Other

## 2016-12-06 ENCOUNTER — Inpatient Hospital Stay (HOSPITAL_COMMUNITY): Payer: Medicare Other

## 2016-12-06 ENCOUNTER — Encounter (HOSPITAL_COMMUNITY): Payer: Self-pay | Admitting: Family Medicine

## 2016-12-06 DIAGNOSIS — S72142A Displaced intertrochanteric fracture of left femur, initial encounter for closed fracture: Secondary | ICD-10-CM | POA: Diagnosis not present

## 2016-12-06 DIAGNOSIS — M6281 Muscle weakness (generalized): Secondary | ICD-10-CM | POA: Diagnosis not present

## 2016-12-06 DIAGNOSIS — S299XXA Unspecified injury of thorax, initial encounter: Secondary | ICD-10-CM | POA: Diagnosis not present

## 2016-12-06 DIAGNOSIS — Z681 Body mass index (BMI) 19 or less, adult: Secondary | ICD-10-CM | POA: Diagnosis not present

## 2016-12-06 DIAGNOSIS — R079 Chest pain, unspecified: Secondary | ICD-10-CM | POA: Diagnosis not present

## 2016-12-06 DIAGNOSIS — S72142D Displaced intertrochanteric fracture of left femur, subsequent encounter for closed fracture with routine healing: Secondary | ICD-10-CM | POA: Diagnosis not present

## 2016-12-06 DIAGNOSIS — T420X1A Poisoning by hydantoin derivatives, accidental (unintentional), initial encounter: Secondary | ICD-10-CM

## 2016-12-06 DIAGNOSIS — E561 Deficiency of vitamin K: Secondary | ICD-10-CM | POA: Diagnosis not present

## 2016-12-06 DIAGNOSIS — I10 Essential (primary) hypertension: Secondary | ICD-10-CM

## 2016-12-06 DIAGNOSIS — I4891 Unspecified atrial fibrillation: Secondary | ICD-10-CM | POA: Diagnosis not present

## 2016-12-06 DIAGNOSIS — I4892 Unspecified atrial flutter: Secondary | ICD-10-CM | POA: Diagnosis not present

## 2016-12-06 DIAGNOSIS — G40209 Localization-related (focal) (partial) symptomatic epilepsy and epileptic syndromes with complex partial seizures, not intractable, without status epilepticus: Secondary | ICD-10-CM | POA: Diagnosis not present

## 2016-12-06 DIAGNOSIS — Z87891 Personal history of nicotine dependence: Secondary | ICD-10-CM | POA: Diagnosis not present

## 2016-12-06 DIAGNOSIS — E43 Unspecified severe protein-calorie malnutrition: Secondary | ICD-10-CM | POA: Diagnosis not present

## 2016-12-06 DIAGNOSIS — T420X5A Adverse effect of hydantoin derivatives, initial encounter: Secondary | ICD-10-CM | POA: Diagnosis present

## 2016-12-06 DIAGNOSIS — R2681 Unsteadiness on feet: Secondary | ICD-10-CM | POA: Diagnosis not present

## 2016-12-06 DIAGNOSIS — R410 Disorientation, unspecified: Secondary | ICD-10-CM | POA: Diagnosis not present

## 2016-12-06 DIAGNOSIS — D500 Iron deficiency anemia secondary to blood loss (chronic): Secondary | ICD-10-CM

## 2016-12-06 DIAGNOSIS — R4182 Altered mental status, unspecified: Secondary | ICD-10-CM | POA: Diagnosis not present

## 2016-12-06 DIAGNOSIS — S7002XD Contusion of left hip, subsequent encounter: Secondary | ICD-10-CM | POA: Diagnosis not present

## 2016-12-06 DIAGNOSIS — Z8249 Family history of ischemic heart disease and other diseases of the circulatory system: Secondary | ICD-10-CM | POA: Diagnosis not present

## 2016-12-06 DIAGNOSIS — L89312 Pressure ulcer of right buttock, stage 2: Secondary | ICD-10-CM | POA: Diagnosis not present

## 2016-12-06 DIAGNOSIS — Z8673 Personal history of transient ischemic attack (TIA), and cerebral infarction without residual deficits: Secondary | ICD-10-CM | POA: Diagnosis not present

## 2016-12-06 DIAGNOSIS — F0390 Unspecified dementia without behavioral disturbance: Secondary | ICD-10-CM | POA: Diagnosis not present

## 2016-12-06 DIAGNOSIS — D72829 Elevated white blood cell count, unspecified: Secondary | ICD-10-CM | POA: Diagnosis not present

## 2016-12-06 DIAGNOSIS — I69398 Other sequelae of cerebral infarction: Secondary | ICD-10-CM | POA: Diagnosis not present

## 2016-12-06 DIAGNOSIS — D62 Acute posthemorrhagic anemia: Secondary | ICD-10-CM | POA: Diagnosis not present

## 2016-12-06 DIAGNOSIS — D689 Coagulation defect, unspecified: Secondary | ICD-10-CM | POA: Diagnosis not present

## 2016-12-06 DIAGNOSIS — Z79899 Other long term (current) drug therapy: Secondary | ICD-10-CM | POA: Diagnosis not present

## 2016-12-06 DIAGNOSIS — D684 Acquired coagulation factor deficiency: Secondary | ICD-10-CM | POA: Diagnosis not present

## 2016-12-06 DIAGNOSIS — I483 Typical atrial flutter: Secondary | ICD-10-CM

## 2016-12-06 DIAGNOSIS — Z7952 Long term (current) use of systemic steroids: Secondary | ICD-10-CM | POA: Diagnosis not present

## 2016-12-06 DIAGNOSIS — G40909 Epilepsy, unspecified, not intractable, without status epilepticus: Secondary | ICD-10-CM | POA: Diagnosis not present

## 2016-12-06 DIAGNOSIS — W010XXA Fall on same level from slipping, tripping and stumbling without subsequent striking against object, initial encounter: Secondary | ICD-10-CM | POA: Diagnosis present

## 2016-12-06 DIAGNOSIS — Y92015 Private garage of single-family (private) house as the place of occurrence of the external cause: Secondary | ICD-10-CM | POA: Diagnosis not present

## 2016-12-06 DIAGNOSIS — E876 Hypokalemia: Secondary | ICD-10-CM | POA: Diagnosis not present

## 2016-12-06 DIAGNOSIS — N200 Calculus of kidney: Secondary | ICD-10-CM | POA: Diagnosis not present

## 2016-12-06 DIAGNOSIS — D649 Anemia, unspecified: Secondary | ICD-10-CM | POA: Diagnosis not present

## 2016-12-06 DIAGNOSIS — Z7901 Long term (current) use of anticoagulants: Secondary | ICD-10-CM | POA: Diagnosis not present

## 2016-12-06 LAB — PROTIME-INR
INR: 1.85
Prothrombin Time: 21.2 seconds — ABNORMAL HIGH (ref 11.4–15.2)

## 2016-12-06 LAB — CBC WITH DIFFERENTIAL/PLATELET
BASOS ABS: 0.1 10*3/uL (ref 0.0–0.1)
Basophils Relative: 1 %
EOS ABS: 0 10*3/uL (ref 0.0–0.7)
Eosinophils Relative: 0 %
HCT: 27.7 % — ABNORMAL LOW (ref 39.0–52.0)
HEMOGLOBIN: 8.9 g/dL — AB (ref 13.0–17.0)
LYMPHS ABS: 0.5 10*3/uL — AB (ref 0.7–4.0)
LYMPHS PCT: 10 %
MCH: 29.3 pg (ref 26.0–34.0)
MCHC: 32.1 g/dL (ref 30.0–36.0)
MCV: 91.1 fL (ref 78.0–100.0)
MONO ABS: 0.8 10*3/uL (ref 0.1–1.0)
Monocytes Relative: 15 %
NEUTROS ABS: 3.9 10*3/uL (ref 1.7–7.7)
Neutrophils Relative %: 74 %
PLATELETS: 257 10*3/uL (ref 150–400)
RBC: 3.04 MIL/uL — ABNORMAL LOW (ref 4.22–5.81)
RDW: 21.4 % — AB (ref 11.5–15.5)
WBC: 5.3 10*3/uL (ref 4.0–10.5)

## 2016-12-06 LAB — BASIC METABOLIC PANEL
Anion gap: 7 (ref 5–15)
BUN: 13 mg/dL (ref 6–20)
CO2: 29 mmol/L (ref 22–32)
CREATININE: 0.74 mg/dL (ref 0.61–1.24)
Calcium: 8.3 mg/dL — ABNORMAL LOW (ref 8.9–10.3)
Chloride: 100 mmol/L — ABNORMAL LOW (ref 101–111)
GFR calc Af Amer: >60 mL/min (ref >60)
GLUCOSE: 138 mg/dL — AB (ref 65–99)
POTASSIUM: 4.2 mmol/L (ref 3.5–5.1)
SODIUM: 136 mmol/L (ref 135–145)

## 2016-12-06 LAB — GLUCOSE, CAPILLARY: Glucose-Capillary: 107 mg/dL — ABNORMAL HIGH (ref 65–99)

## 2016-12-06 LAB — POC OCCULT BLOOD, ED: Fecal Occult Bld: POSITIVE — AB

## 2016-12-06 LAB — PHENYTOIN LEVEL, TOTAL: Phenytoin Lvl: 25.2 ug/mL — ABNORMAL HIGH (ref 10.0–20.0)

## 2016-12-06 MED ORDER — ONDANSETRON HCL 4 MG/2ML IJ SOLN
INTRAMUSCULAR | Status: AC
  Filled 2016-12-06: qty 2

## 2016-12-06 MED ORDER — PHENYTOIN SODIUM EXTENDED 100 MG PO CAPS: 300.0000 mg | ORAL_CAPSULE | Freq: Every day | ORAL | Status: DC

## 2016-12-06 MED ORDER — LEVETIRACETAM 500 MG PO TABS
1500.0000 mg | ORAL_TABLET | Freq: Two times a day (BID) | ORAL | Status: DC
  Administered 2016-12-06: 1500 mg via ORAL
  Filled 2016-12-06: qty 3

## 2016-12-06 MED ORDER — ROCURONIUM BROMIDE 10 MG/ML (PF) SYRINGE
PREFILLED_SYRINGE | INTRAVENOUS | Status: AC
  Filled 2016-12-06: qty 5

## 2016-12-06 MED ORDER — BISACODYL 10 MG RE SUPP: 10.0000 mg | Freq: Every day | RECTAL | Status: DC | PRN

## 2016-12-06 MED ORDER — SODIUM CHLORIDE 0.9 % IV SOLN
300.0000 mg | Freq: Every day | INTRAVENOUS | Status: DC
  Filled 2016-12-06: qty 6

## 2016-12-06 MED ORDER — PROPOFOL 10 MG/ML IV BOLUS
INTRAVENOUS | Status: AC
  Filled 2016-12-06: qty 20

## 2016-12-06 MED ORDER — FERROUS SULFATE 325 (65 FE) MG PO TABS
325.0000 mg | ORAL_TABLET | Freq: Three times a day (TID) | ORAL | Status: DC
  Administered 2016-12-06 – 2016-12-15 (×24): 325 mg via ORAL
  Filled 2016-12-06 (×26): qty 1

## 2016-12-06 MED ORDER — HYDROMORPHONE HCL 1 MG/ML IJ SOLN
0.5000 mg | INTRAMUSCULAR | Status: DC | PRN
  Administered 2016-12-06 – 2016-12-08 (×5): 0.5 mg via INTRAVENOUS
  Filled 2016-12-06 (×6): qty 1

## 2016-12-06 MED ORDER — HYDROCODONE-ACETAMINOPHEN 5-325 MG PO TABS
1.0000 | ORAL_TABLET | Freq: Four times a day (QID) | ORAL | Status: DC | PRN
  Administered 2016-12-06: 1 via ORAL
  Administered 2016-12-06: 2 via ORAL
  Administered 2016-12-07 (×2): 1 via ORAL
  Filled 2016-12-06: qty 2
  Filled 2016-12-06 (×3): qty 1

## 2016-12-06 MED ORDER — MORPHINE SULFATE (PF) 4 MG/ML IV SOLN: 0.5000 mg | INTRAVENOUS | Status: DC | PRN

## 2016-12-06 MED ORDER — PHENYLEPHRINE 40 MCG/ML (10ML) SYRINGE FOR IV PUSH (FOR BLOOD PRESSURE SUPPORT)
PREFILLED_SYRINGE | INTRAVENOUS | Status: AC
  Filled 2016-12-06: qty 20

## 2016-12-06 MED ORDER — ENSURE ENLIVE PO LIQD
237.0000 mL | Freq: Two times a day (BID) | ORAL | Status: DC
  Administered 2016-12-06 – 2016-12-08 (×2): 237 mL via ORAL

## 2016-12-06 MED ORDER — LIDOCAINE 2% (20 MG/ML) 5 ML SYRINGE
INTRAMUSCULAR | Status: AC
  Filled 2016-12-06: qty 5

## 2016-12-06 MED ORDER — SODIUM CHLORIDE 0.9 % IV SOLN
1500.0000 mg | Freq: Two times a day (BID) | INTRAVENOUS | Status: DC
  Administered 2016-12-06 – 2016-12-09 (×6): 1500 mg via INTRAVENOUS
  Filled 2016-12-06 (×9): qty 15

## 2016-12-06 MED ORDER — SODIUM CHLORIDE 0.9 % IV SOLN
10.0000 mg | Freq: Two times a day (BID) | INTRAVENOUS | Status: DC
  Administered 2016-12-06 (×2): 10 mg via INTRAVENOUS
  Filled 2016-12-06 (×4): qty 1

## 2016-12-06 MED ORDER — SODIUM CHLORIDE 0.9 % IV SOLN
INTRAVENOUS | Status: DC
  Administered 2016-12-06: 10:00:00 via INTRAVENOUS

## 2016-12-06 MED ORDER — POLYETHYLENE GLYCOL 3350 17 G PO PACK
17.0000 g | PACK | Freq: Every day | ORAL | Status: DC | PRN
  Administered 2016-12-15: 17 g via ORAL
  Filled 2016-12-06: qty 1

## 2016-12-06 MED ORDER — PHENYTOIN SODIUM 50 MG/ML IJ SOLN
100.0000 mg | Freq: Three times a day (TID) | INTRAMUSCULAR | Status: DC
  Administered 2016-12-06 – 2016-12-08 (×4): 100 mg via INTRAVENOUS
  Filled 2016-12-06 (×9): qty 2

## 2016-12-06 MED ORDER — DOCUSATE SODIUM 100 MG PO CAPS
100.0000 mg | ORAL_CAPSULE | Freq: Two times a day (BID) | ORAL | Status: DC
  Administered 2016-12-06 – 2016-12-15 (×17): 100 mg via ORAL
  Filled 2016-12-06 (×18): qty 1

## 2016-12-06 MED ORDER — FENTANYL CITRATE (PF) 250 MCG/5ML IJ SOLN
INTRAMUSCULAR | Status: AC
  Filled 2016-12-06: qty 5

## 2016-12-06 NOTE — Anesthesia Preprocedure Evaluation (Addendum)
Anesthesia Evaluation  Patient identified by MRN, date of birth, ID band Patient awake    Reviewed: Allergy & Precautions, H&P , NPO status , Patient's Chart, lab work & pertinent test results  Airway Mallampati: II  TM Distance: >3 FB Neck ROM: Full    Dental no notable dental hx.    Pulmonary neg pulmonary ROS, former smoker,    Pulmonary exam normal breath sounds clear to auscultation       Cardiovascular hypertension, Pt. on medications + Peripheral Vascular Disease  negative cardio ROS Normal cardiovascular exam+ dysrhythmias Atrial Fibrillation  Rhythm:Regular Rate:Normal     Neuro/Psych Seizures -,   Neuromuscular disease CVA, Residual Symptoms negative neurological ROS  negative psych ROS   GI/Hepatic negative GI ROS, Neg liver ROS, hiatal hernia, (+)     substance abuse  alcohol use,   Endo/Other  negative endocrine ROS  Renal/GU negative Renal ROS  negative genitourinary   Musculoskeletal negative musculoskeletal ROS (+)   Abdominal   Peds negative pediatric ROS (+)  Hematology negative hematology ROS (+) anemia ,   Anesthesia Other Findings   Reproductive/Obstetrics negative OB ROS                            Anesthesia Physical Anesthesia Plan  ASA: III  Anesthesia Plan: General   Post-op Pain Management:    Induction: Intravenous  PONV Risk Score and Plan: 2 and Ondansetron, Dexamethasone and Treatment may vary due to age or medical condition  Airway Management Planned: Oral ETT  Additional Equipment:   Intra-op Plan:   Post-operative Plan: Extubation in OR  Informed Consent: I have reviewed the patients History and Physical, chart, labs and discussed the procedure including the risks, benefits and alternatives for the proposed anesthesia with the patient or authorized representative who has indicated his/her understanding and acceptance.     Plan Discussed  with: CRNA, Anesthesiologist and Surgeon  Anesthesia Plan Comments: (  )       Anesthesia Quick Evaluation

## 2016-12-06 NOTE — H&P (Signed)
History and Physical  Patient Name: [name]     UUV:[id number]    DOB: 1940/05/30    DOA: 12/05/2016 Referring provider: Dr. Christy Gentles PCP: [name], MD  Outpatient specialists:  Cardiology, Dr. Rayann Heman     Neurology, Dr. [name]     Orthopedics, Dr. Lorre Munroe, [facility]  Patient coming from: Home     Chief Complaint: Hip pain and fall  HPI: [name] is a 76 y.o. male with a past medical history significant for hx of CVA with residual seizures on Phenytoin/Keppra, Aflutter and hx of GIB who presents with hip pain after a fall.  The patient was in his normal state of health until this evening when he was in the garage, tried to swat a bug, lost his balance and fell on his left side.  He had immediate LEFT hip pain and could not walk.    ED course: -Afebrile, heart rate 75, respirations pulse ox normal, blood pressure 107/64 -Sodium 136, potassium 4.2, creatinine 0.74 (baseline 0.7), WBC 5.3K, hemoglobin 8.9, normocytic (baseline 10.4) -INR 1.8 -Phenytoin level slightly supratherapeutic -Fecal occult blood testing positive, but stool grossly normal -Radiograph of the pelvis showed a left intertrochanteric hip fracture -The case was discussed with Dr. Percell Miller who agreed to see the patient in the morning, and asked that the patient be made nothing by mouth for possible surgery   There was no preceding dizziness, weakness, lightheadedness or vertigo, no chest discomfort, palpitations, or dyspnea, nor are there any of those symptoms now.  The patient denies active heart issues, angina, or history of MI. He can exert pertinent equivalent of 4 METS without difficulty. He has had two strokes.  He denies history of CAD, CHF, or DM treated with insulin, history of COPD or symptoms of OSA.  No alcohol/withdrawal, denies side effects with previous anesthesia.  Anesthesia Specific concerns: Presence of loose teeth: None Anesthesia problems in past: None History of bleeding  disorder: On warfarin, currently subtherapeutic  Review of Systems:  Review of Systems  Respiratory: Negative for cough and shortness of breath.   Cardiovascular: Negative for chest pain, palpitations, orthopnea and leg swelling.  Gastrointestinal: Negative for blood in stool and melena.  Musculoskeletal: Positive for falls and joint pain.  All other systems reviewed and are negative.         Past Medical History:  Diagnosis Date  . Hiatal hernia   . HTN (hypertension)    pt denies h/o HTN though it appears he was started on spironolactone during his hospitalization 3/13  . Pancreatitis   . Seizures ([place])   . Stroke ([place])   . Typical atrial flutter ([place]) 3/13    Past Surgical History:  Procedure Laterality Date  . ESOPHAGOGASTRODUODENOSCOPY N/A 11/30/2015   Procedure: ESOPHAGOGASTRODUODENOSCOPY (EGD);  Surgeon: Wonda Horner, MD;  [facility] END[facility]COPY;  Service: Endoscopy;  Laterality: N/A;  . no surgical history  06/2011    Social History: Patient lives alone.  Patient walks unassisted.  Refuses to use cane or walker.  Remote former smoker.  From [place].  Worked for post office.  Has daughter in [place].  Mild dementia, has assistance with meds.  No Known Allergies  Family history: family history includes Hypertension in his unknown relative.  Prior to Admission medications   Medication Sig Start Date End Date Taking? Authorizing Provider  levETIRAcetam (KEPPRA) 500 MG tablet Take 3 tablets twice a day 11/12/16  Yes Cameron Sprang, MD  phenytoin (DILANTIN) 100 MG ER capsule Take  3 capsules (300 mg total) by mouth daily. 11/12/16  Yes Cameron Sprang, MD  predniSONE (DELTASONE) 10 MG tablet Take 10-30 mg by mouth See admin instructions. Alternate taking with 10mg  and 30mg  11/19/16  Yes [provider]  warfarin (COUMADIN) 2.5 MG tablet TAKE 1 OR 2 TABLETS BY MOUTH DAILY AS DIRECTED BY COUMADIN CLINIC Patient taking differently: Take 2.5-5 mg by mouth See  admin instructions. TAKE 1 OR 2 TABLETS BY MOUTH DAILY AS DIRECTED BY COUMADIN CLINIC 11/10/16  Yes Allred, Jeneen Rinks, MD       Physical Exam: BP 108/73   Pulse 73   Temp 98.7 F (37.1 C) (Oral)   Resp 16   Ht 5\' 11"  (1.803 m)   Wt 59 kg (130 lb)   SpO2 (!) 87%   BMI 18.13 kg/m  General appearance: Well-developed, thin adult male, alert and in mild distress from pain.   Eyes: Anicteric, conjunctiva pink, lids and lashes normal. PERRL.    ENT: No nasal deformity, discharge, epistaxis.  Hearing normal. OP moist without lesions.  Dentition good.    Lymph: No cervical, supraclavicular or axillary lymphadenopathy. Skin: Warm and dry.  No jaundice.  No suspicious rashes or lesions. Cardiac: RRR, nl S1-S2, no murmurs appreciated.  Capillary refill is brisk.  JVP normal.  No LE edema.  Radial pulses 2+ and symmetric.  No carotid bruits. Respiratory: Normal respiratory rate and rhythm.  CTAB without rales or wheezes. GI: Abdomen soft without rigidity.  No TTP. No ascites, distension, no hepatosplenomegaly.  MSK: LEFT leg foreshortened and externally rotated.  No effusions.  No clubbing or cyanosis. Neuro: Cranial nerves normal.  Sensorium intact and responding to questions,memory seems impaired.  Speech is fluent.  Moves all extremities equally and with normal coordination except left leg, limited by pain.    Psych: The patient is oriented to time, place and person but is forgetful and somewhat inattentive. Behavior appropriate.  Affect normal.     Labs on Admission:  I have personally reviewed following labs and imaging studies: CBC:  Recent Labs Lab 12/05/16 2355  WBC 5.3  NEUTROABS 3.9  HGB 8.9*  HCT 27.7*  MCV 91.1  PLT 595   Basic Metabolic Panel:  Recent Labs Lab 12/05/16 2355  NA 136  K 4.2  CL 100*  CO2 29  GLUCOSE 138*  BUN 13  CREATININE 0.74  CALCIUM 8.3*   GFR: Estimated Creatinine Clearance: 64.5 mL/min (by C-G formula based on SCr of 0.74 mg/dL).  Liver  Function Tests: No results for input(s): AST, ALT, ALKPHOS, BILITOT, PROT, ALBUMIN in the last 168 hours. No results for input(s): LIPASE, AMYLASE in the last 168 hours. No results for input(s): AMMONIA in the last 168 hours. Coagulation Profile:  Recent Labs Lab 12/05/16 2355  INR 1.85   Cardiac Enzymes: No results for input(s): CKTOTAL, CKMB, CKMBINDEX, TROPONINI in the last 168 hours. BNP (last 3 results) No results for input(s): PROBNP in the last 8760 hours. HbA1C: No results for input(s): HGBA1C in the last 72 hours. CBG: No results for input(s): GLUCAP in the last 168 hours. Lipid Profile: No results for input(s): CHOL, HDL, LDLCALC, TRIG, CHOLHDL, LDLDIRECT in the last 72 hours. Thyroid Function Tests: No results for input(s): TSH, T4TOTAL, FREET4, T3FREE, THYROIDAB in the last 72 hours. Anemia Panel: No results for input(s): VITAMINB12, FOLATE, FERRITIN, TIBC, IRON, RETICCTPCT in the last 72 hours.    Radiological Exams on Admission: Personally reviewed CXR shows no focal opacity or edema;  Hip radiograph report reviewed: Dg Chest 1 View  Result Date: 12/06/2016 CLINICAL DATA:  Pain after a fall.  Left hip fracture. EXAM: CHEST 1 VIEW COMPARISON:  12/08/2015 FINDINGS: Normal heart size and pulmonary vascularity. Diffuse emphysematous changes in the lungs. No airspace disease or consolidation. No blunting of costophrenic angles. No pneumothorax. Mediastinal contours appear intact. Degenerative changes in the shoulders. IMPRESSION: Emphysematous changes in the lungs. No evidence of active pulmonary disease. Electronically Signed   By: Lucienne Capers [name]   On: 12/06/2016 01:23   Dg Hip Unilat With Pelvis 2-3 Views Left  Result Date: 12/06/2016 CLINICAL DATA:  Pain after a fall. EXAM: DG HIP (WITH OR WITHOUT PELVIS) 2-3V LEFT COMPARISON:  None. FINDINGS: Diffuse bone demineralization. There is an acute comminuted fracture of the inter trochanteric left hip with focal  extension to the sub trochanteric region. There is associated varus angulation of the left proximal femur. Displaced lesser trochanteric fragment. Degenerative changes in both hips and in the lower lumbar spine. Pelvis appears intact. SI joints and symphysis pubis are not displaced. Calcified phleboliths in the pelvis. IMPRESSION: Acute comminuted fracture of the inter trochanteric left hip with varus angulation. Electronically Signed   By: Lucienne Capers [name]   On: 12/06/2016 01:22    EKG: Independently reviewed. Rate 80, QTc 455, incomplete RBBB, no change from previous.  Echocardiogram 2013: Report reviewed Yes 60-65% No significant valvular disease    Assessment and Plan: 1. Hip fracture: The patient will be seen by Dr. Percell Miller in the morning at [facility], to evaluate for operative fixation of the LEFT hip.   -Admit to med-surg bed -Hydrocodone-acetaminophen or morphine as tolerated for pain -Bed rest, apply ice, document sedation and vitals per Hip fracture protocol -NPO at midnight -MIVF -Nutrition consulted -Hold following meds:  -warfarin   Overall, the patient is at low risk for the planned surgery.  Patient has a RCRI score of 1 and Gupta CV risk 0.77%. The patient has no active cardiac symptoms, a functional capacity >4 METs, and would be expected to be at average risk for cardiac complications from this intermediate risk procedure. -No further testing needed  Post-operative medical care: Per AAOS 2014 guidelines on hip fractures in the elderly: -Recommend osteoporosis screening after discharge if not done previously -Recommend vitamin D 800 IUand calcium 1200 mg supplementation after discharge     2. Atrial flutter: Sinus rhythm now.  CHADS2Vasc 4. -Hold warfarin, restart after surgery  3. Anemia: From chronic blood loss likely.  Hx of melena last year, from erosive gastropathy, no longer on PPI.  No recent bleeding. -Transfusion threshold 7 g/dL -Check  ferritin -Start iron -GI follow up as outpatient  4. Seizures: -Continue Keppra -Continue Dilantin (d/w RPh, hold one dose for random elevated level)        DVT prophylaxis: SCDs  Diet: NPO Code Status: FULL  Family Communication: Sister and niece at bedside  Disposition Plan: Anticipate evaluation by Orthopedics and surgical fixation tomorrow, then PT evaluation and discharge to SNF in 2-3 days. Admission status: INPATIENT for hip fracture, medical surgical bed     Medical decision making and consults: Patient seen at 3:11 AM on 12/06/2016.  The patient was discussed with Dr. Christy Gentles. What exists of the patient's chart was reviewed in depth and summarized above.  Clinical condition: stable.      Edwin Dada [facility] Pager [phone]

## 2016-12-06 NOTE — Progress Notes (Signed)
Initial Nutrition Assessment  DOCUMENTATION CODES:   Underweight  INTERVENTION:  Provide Ensure Enlive po BID, each supplement provides 350 kcal and 20 grams of protein.  Encourage adequate PO intake.   NUTRITION DIAGNOSIS:   Increased nutrient needs related to  (surgery) as evidenced by estimated needs.  GOAL:   Patient will meet greater than or equal to 90% of their needs  MONITOR:   PO intake, Supplement acceptance, Labs, Weight trends, Skin, I & O's  REASON FOR ASSESSMENT:   Consult Hip fracture protocol  ASSESSMENT:   76 year old male with history of prior stroke, mild dementia, seizure disorder, atrial flutter, history of GI bleed presented after a fall sustaining hip pain. Found to have left intertrochanteric hip fracture.  Pt unavailable during attempted time of visit. RD unable to obtain most recent nutrition history. Diet has just been advanced to a soft diet. RD to order nutritional supplements to aid in caloric and protein needs as well as in pre/post op surgery nutrition. Weight has been stable per weight records. Unable to complete Nutrition-Focused physical exam at this time. RD to perform physical exam at next visit.   Labs and medications reviewed.   Diet Order:  Diet NPO time specified Except for: Sips with Meds, Ice Chips DIET SOFT Room service appropriate? Yes; Fluid consistency: Thin  Skin:  Reviewed, no issues  Last BM:  Unknown  Height:   Ht Readings from Last 1 Encounters:  12/05/16 5\' 11"  (1.803 m)    Weight:   Wt Readings from Last 1 Encounters:  12/05/16 130 lb (59 kg)    Ideal Body Weight:  78.18 kg  BMI:  Body mass index is 18.13 kg/m.  Estimated Nutritional Needs:   Kcal:  1800-2000  Protein:  80-90 grams  Fluid:  1.8 - 2 L/day  EDUCATION NEEDS:   No education needs identified at this time  Corrin Parker, MS, RD, LDN Pager # [phone] After hours/ weekend pager # [phone]

## 2016-12-06 NOTE — Consult Note (Signed)
ORTHOPAEDIC CONSULTATION  REQUESTING PHYSICIAN: Rosita Fire, MD  Chief Complaint: Left hip fracture   HPI: [name] is a 76 y.o. male who complains of  A mechanical fall yesterday with left hip pain.   Past Medical History:  Diagnosis Date  . Hiatal hernia   . HTN (hypertension)    pt denies h/o HTN though it appears he was started on spironolactone during his hospitalization 3/13  . Pancreatitis   . Seizures ([place])   . Stroke ([place])   . Typical atrial flutter ([place]) 3/13   Past Surgical History:  Procedure Laterality Date  . ESOPHAGOGASTRODUODENOSCOPY N/A 11/30/2015   Procedure: ESOPHAGOGASTRODUODENOSCOPY (EGD);  Surgeon: Wonda Horner, MD;  Location: [facility] ENDOSCOPY;  Service: Endoscopy;  Laterality: N/A;  . no surgical history  06/2011   Social History   Social History  . Marital status: Divorced    Spouse name: N/A  . Number of children: N/A  . Years of education: N/A   Social History Main Topics  . Smoking status: Former Smoker    Quit date: 07/15/1966  . Smokeless tobacco: Never Used  . Alcohol use No     Comment: Quit greater than 5 years ago  . Drug use: No  . Sexual activity: Not Asked   Other Topics Concern  . None   Social History Narrative   Lives alone.  Retired from [employer]   Family History  Problem Relation Age of Onset  . Hypertension Unknown    No Known Allergies Prior to Admission medications   Medication Sig Start Date End Date Taking? Authorizing Provider  levETIRAcetam (KEPPRA) 500 MG tablet Take 3 tablets twice a day 11/12/16  Yes Cameron Sprang, MD  phenytoin (DILANTIN) 100 MG ER capsule Take 3 capsules (300 mg total) by mouth daily. 11/12/16  Yes Cameron Sprang, MD  predniSONE (DELTASONE) 10 MG tablet Take 10-30 mg by mouth See admin instructions. Alternate taking with 56m and 366m8/31/18  Yes [provider]  warfarin (COUMADIN) 2.5 MG tablet TAKE 1 OR 2 TABLETS BY MOUTH DAILY AS DIRECTED BY  COUMADIN CLINIC Patient taking differently: Take 2.5-5 mg by mouth See admin instructions. TAKE 1 OR 2 TABLETS BY MOUTH DAILY AS DIRECTED BY COUMADIN CLINIC 11/10/16  Yes AlThompson GrayerMD   Dg Chest 1 View  Result Date: 12/06/2016 CLINICAL DATA:  Pain after a fall.  Left hip fracture. EXAM: CHEST 1 VIEW COMPARISON:  12/08/2015 FINDINGS: Normal heart size and pulmonary vascularity. Diffuse emphysematous changes in the lungs. No airspace disease or consolidation. No blunting of costophrenic angles. No pneumothorax. Mediastinal contours appear intact. Degenerative changes in the shoulders. IMPRESSION: Emphysematous changes in the lungs. No evidence of active pulmonary disease. Electronically Signed   By: WiLucienne Capers.D.   On: 12/06/2016 01:23   Dg Hip Unilat With Pelvis 2-3 Views Left  Result Date: 12/06/2016 CLINICAL DATA:  Pain after a fall. EXAM: DG HIP (WITH OR WITHOUT PELVIS) 2-3V LEFT COMPARISON:  None. FINDINGS: Diffuse bone demineralization. There is an acute comminuted fracture of the inter trochanteric left hip with focal extension to the sub trochanteric region. There is associated varus angulation of the left proximal femur. Displaced lesser trochanteric fragment. Degenerative changes in both hips and in the lower lumbar spine. Pelvis appears intact. SI joints and symphysis pubis are not displaced. Calcified phleboliths in the pelvis. IMPRESSION: Acute comminuted fracture of the inter trochanteric left hip with varus angulation. Electronically Signed  By: Lucienne Capers [name]   On: 12/06/2016 01:22    Positive ROS: All other systems have been reviewed and were otherwise negative with the exception of those mentioned in the HPI and as above.  Labs cbc  Recent Labs  12/05/16 2355  WBC 5.3  HGB 8.9*  HCT 27.7*  PLT 257    Labs inflam No results for input(s): CRP in the last 72 hours.  Invalid input(s): ESR  Labs coag  Recent Labs  12/05/16 2355  INR 1.85      Recent Labs  12/05/16 2355  NA 136  K 4.2  CL 100*  CO2 29  GLUCOSE 138*  BUN 13  CREATININE 0.74  CALCIUM 8.3*    Physical Exam: Vitals:   12/06/16 0700 12/06/16 0715  BP: 120/66 114/65  Pulse: 89 94  Resp: 16 20  Temp:    SpO2: 99% 95%   General: Alert, no acute distress Cardiovascular: No pedal edema Respiratory: No cyanosis, no use of accessory musculature GI: No organomegaly, abdomen is soft and non-tender Skin: No lesions in the area of chief complaint other than those listed below in MSK exam.  Neurologic: Sensation intact distally save for the below mentioned MSK exam Psychiatric: Patient is competent for consent with normal mood and affect Lymphatic: No axillary or cervical lymphadenopathy  MUSCULOSKELETAL:  LLE: compartments soft, NVI, pain with ROM Other extremities are atraumatic with painless ROM and NVI.  Assessment: Left intertroch fracture  Plan: IM nail L hip today   Renette Butters, MD Cell [phone]   12/06/2016 8:24 AM

## 2016-12-06 NOTE — Progress Notes (Signed)
PROGRESS NOTE    [name] [name]  QMG:[id number] DOB: 1940-04-13 DOA: 12/05/2016 PCP: [name], MD   Brief Narrative: 76 year old male with history of prior stroke, mild dementia, seizure disorder, atrial flutter, history of GI bleed presented after a fall sustaining hip pain. Found to have left intertrochanteric hip fracture. Evaluated by orthopedics.  Assessment & Plan:   #  Closed fracture of femur, intertrochanteric, left, initial encounter [facility]): - Planned for surgery tomorrow as per Dr. Percell Miller. Ordered nothing by mouth starting midnight. Soft diet for now. Continue IV fluid. Preop evaluation done on admission. Continue to hold Coumadin. Monitor INR. Pain management.  # Acute encephalopathy likely delirium: I discussed with the patient's daughter over the phone. Patient has history of mild dementia, seizure disorder and prior stroke. He has confusion intermittently. Today patient was alert awake and oriented to himself. Likely has delirium related with the pain, medication and being in the hospital. Ordered CT scan of head to rule out any acute pathology. Continue supportive care.  #  Essential hypertension: Monitor blood pressure closely.  # Atrial flutter ([place]), type unspecified: Monitor heart rate. Coumadin on hold.  #Seizure disorder: Continue Dilantin, Keppra. Chest IV form. Seizure and fall precaution.    # Iron deficiency anemia due to chronic blood loss: Drop in hemoglobin. Patient with history of prior GI bleed and is on Coumadin. No active bleeding noticed. Recommended to follow-up with GI.  DVT prophylaxis: On Coumadin with INR 1.8 Code Status: Full code Family Communication: Discussed with patient's daughter over the phone Disposition Plan: Currently admitted    Consultants:   Orthopedics  Procedures: None Antimicrobials: None  Subjective: Seen and examined at bedside. Patient was alert awake and oriented to his name only. Looks confused. Denied any  symptoms. Denied pain, headache, dizziness, chest pain or shortness of breath.  Objective: Vitals:   12/06/16 0630 12/06/16 0700 12/06/16 0715 12/06/16 1148  BP:  120/66 114/65 (!) 111/56  Pulse: 89 89 94 76  Resp: 16 16 20    Temp:    98.8 F (37.1 C)  TempSrc:    Oral  SpO2: 95% 99% 95%   Weight:      Height:        Intake/Output Summary (Last 24 hours) at 12/06/16 1218 Last data filed at 12/06/16 0900  Gross per 24 hour  Intake                0 ml  Output              300 ml  Net             -300 ml   Filed Weights   12/05/16 2317  Weight: 59 kg (130 lb)    Examination:  General exam: Appears calm and comfortable , Mild confusion Respiratory system: Clear to auscultation. Respiratory effort normal. No wheezing or crackle Cardiovascular system: S1 & S2 heard, RRR.   Gastrointestinal system: Abdomen is nondistended, soft and nontender. Normal bowel sounds heard. Central nervous system: Alert awake and oriented to himself only. Extremities: No edema Skin: No rashes, lesions or ulcers Psychiatry: Unable to assess.     Data Reviewed: I have personally reviewed following labs and imaging studies  CBC:  Recent Labs Lab 12/05/16 2355  WBC 5.3  NEUTROABS 3.9  HGB 8.9*  HCT 27.7*  MCV 91.1  PLT 326   Basic Metabolic Panel:  Recent Labs Lab 12/05/16 2355  NA 136  K 4.2  CL 100*  CO2  29  GLUCOSE 138*  BUN 13  CREATININE 0.74  CALCIUM 8.3*   GFR: Estimated Creatinine Clearance: 64.5 mL/min (by C-G formula based on SCr of 0.74 mg/dL). Liver Function Tests: No results for input(s): AST, ALT, ALKPHOS, BILITOT, PROT, ALBUMIN in the last 168 hours. No results for input(s): LIPASE, AMYLASE in the last 168 hours. No results for input(s): AMMONIA in the last 168 hours. Coagulation Profile:  Recent Labs Lab 12/05/16 2355  INR 1.85   Cardiac Enzymes: No results for input(s): CKTOTAL, CKMB, CKMBINDEX, TROPONINI in the last 168 hours. BNP (last 3  results) No results for input(s): PROBNP in the last 8760 hours. HbA1C: No results for input(s): HGBA1C in the last 72 hours. CBG:  Recent Labs Lab 12/06/16 1146  GLUCAP 107*   Lipid Profile: No results for input(s): CHOL, HDL, LDLCALC, TRIG, CHOLHDL, LDLDIRECT in the last 72 hours. Thyroid Function Tests: No results for input(s): TSH, T4TOTAL, FREET4, T3FREE, THYROIDAB in the last 72 hours. Anemia Panel: No results for input(s): VITAMINB12, FOLATE, FERRITIN, TIBC, IRON, RETICCTPCT in the last 72 hours. Sepsis Labs: No results for input(s): PROCALCITON, LATICACIDVEN in the last 168 hours.  No results found for this or any previous visit (from the past 240 hour(s)).       Radiology Studies: Dg Chest 1 View  Result Date: 12/06/2016 CLINICAL DATA:  Pain after a fall.  Left hip fracture. EXAM: CHEST 1 VIEW COMPARISON:  12/08/2015 FINDINGS: Normal heart size and pulmonary vascularity. Diffuse emphysematous changes in the lungs. No airspace disease or consolidation. No blunting of costophrenic angles. No pneumothorax. Mediastinal contours appear intact. Degenerative changes in the shoulders. IMPRESSION: Emphysematous changes in the lungs. No evidence of active pulmonary disease. Electronically Signed   By: Lucienne Capers [name]   On: 12/06/2016 01:23   Dg Hip Unilat With Pelvis 2-3 Views Left  Result Date: 12/06/2016 CLINICAL DATA:  Pain after a fall. EXAM: DG HIP (WITH OR WITHOUT PELVIS) 2-3V LEFT COMPARISON:  None. FINDINGS: Diffuse bone demineralization. There is an acute comminuted fracture of the inter trochanteric left hip with focal extension to the sub trochanteric region. There is associated varus angulation of the left proximal femur. Displaced lesser trochanteric fragment. Degenerative changes in both hips and in the lower lumbar spine. Pelvis appears intact. SI joints and symphysis pubis are not displaced. Calcified phleboliths in the pelvis. IMPRESSION: Acute comminuted  fracture of the inter trochanteric left hip with varus angulation. Electronically Signed   By: Lucienne Capers [name]   On: 12/06/2016 01:22        Scheduled Meds: . docusate sodium  100 mg Oral BID  . ferrous sulfate  325 mg Oral TID PC  . [START ON 12/07/2016] phenytoin  300 mg Oral Daily   Continuous Infusions: . sodium chloride 100 mL/hr at 12/06/16 0934  . famotidine (PEPCID) IV 10 mg (12/06/16 0935)  . levETIRAcetam       LOS: 0 days    Erek Kowal Tanna Furry, MD [facility] Pager [phone]  If 7PM-7AM, please contact night-coverage [email or web address] Password [facility] 12/06/2016, 12:18 PM

## 2016-12-06 NOTE — ED Provider Notes (Signed)
[facility] DEPT Provider Note   CSN: [id number] Arrival date & time: 12/05/16  2256     History   Chief Complaint Chief Complaint  Patient presents with  . Fall    HPI [name] is a 76 y.o. male.  The history is provided by the patient and a relative.  Fall  This is a new problem. The current episode started 1 to 2 hours ago. The problem occurs constantly. The problem has not changed since onset.Pertinent negatives include no chest pain, no abdominal pain, no headaches and no shortness of breath. The symptoms are aggravated by walking. The symptoms are relieved by rest.  Patient claims he was in his garage, trying to hit an insect and he lost his balance and fell landing on left hip No head injury No HA No neck or back pain No cp/abd pain He has pain in left hip only  Past Medical History:  Diagnosis Date  . Hiatal hernia   . HTN (hypertension)    pt denies h/o HTN though it appears he was started on spironolactone during his hospitalization 3/13  . Pancreatitis   . Seizures ([place])   . Stroke ([place])   . Typical atrial flutter ([place]) 3/13    Patient Active Problem List   Diagnosis Date Noted  . Mild dementia 07/15/2016  . HCAP (healthcare-associated pneumonia) 12/01/2015  . Aspiration pneumonia ([place]) 12/01/2015  . Leukocytosis   . Erosive gastropathy: Per EGD 11/30/2015 11/30/2015  . Gastrointestinal hemorrhage with melena 11/28/2015  . Coumadin toxicity 11/28/2015  . Anemia, iron deficiency 11/28/2015  . Acute blood loss anemia 11/28/2015  . Blood loss anemia   . Localization-related symptomatic epilepsy and epileptic syndromes with complex partial seizures, not intractable, without status epilepticus ([place]) 07/07/2015  . Encounter for therapeutic drug monitoring 08/10/2013  . Dementia ([place]) 11/16/2012  . Long term (current) use of anticoagulants 06/30/2011  . CVA (cerebral infarction) 06/08/2011  . Atrial flutter ([place]) 06/04/2011  . Status  epilepticus ([place]) 06/02/2011  . 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 SINUSITIS, CHRONIC NEC 12/14/2006  . ANEMIA, DEFICIENCY NOS 10/11/2006  . ABUSE, ALCOHOL, CONTINUOUS 10/11/2006  . 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 ABNORMAL RESULT, FUNCTION STUDY, LIVER 10/11/2006  . CEREBROVASCULAR ACCIDENT, HX OF 10/11/2006  . DEMENTIA 09/15/2006  . Essential hypertension 09/15/2006  . CVA 09/15/2006  . ALLERGIC RHINITIS 09/15/2006  . CIRRHOSIS, ALCOHOLIC, LIVER 66/44/0347  . Seizure disorder ([place]) 09/15/2006  . PANCREATITIS, HX OF 09/15/2006    Past Surgical History:  Procedure Laterality Date  . ESOPHAGOGASTRODUODENOSCOPY N/A 11/30/2015   Procedure: ESOPHAGOGASTRODUODENOSCOPY (EGD);  Surgeon: Wonda Horner, MD;  Location[facility] END[facility]COPY;  Service: Endoscopy;  Laterality: N/A;  . no surgical history  06/2011       Home Medications    Prior to Admission medications   Medication Sig Start Date End Date Taking? Authorizing Provider  levETIRAcetam (KEPPRA) 500 MG tablet Take 3 tablets twice a day 11/12/16   Cameron Sprang, MD  phenytoin (DILANTIN) 100 MG ER capsule Take 3 capsules (300 mg total) by mouth daily. 11/12/16   Cameron Sprang, MD  predniSONE (DELTASONE) 20 MG tablet Take 10-20 mg by mouth as directed. Alternating doses 07/05/16   [provider]  warfarin (COUMADIN) 2.5 MG tablet TAKE 1 OR 2 TABLETS BY MOUTH DAILY AS DIRECTED BY COUMADIN CLINIC 11/10/16   [name], MD    Family History Family History  Problem Relation Age of Onset  . Hypertension Unknown  Social History Social History  Substance Use Topics  . Smoking status: Former Smoker    Quit date: 07/15/1966  . Smokeless tobacco: Never Used  . Alcohol use No     Comment: Quit greater than 5 years ago     Allergies   Patient has no known allergies.   Review of Systems Review of Systems  Constitutional: Negative for fever.  Respiratory: Negative for shortness of breath.     Cardiovascular: Negative for chest pain.  Gastrointestinal: Negative for abdominal pain and blood in stool.  Musculoskeletal: Positive for arthralgias. Negative for gait problem.  Neurological: Negative for headaches.  All other systems reviewed and are negative.    Physical Exam Updated Vital Signs BP 136/74   Pulse 82   Temp 98.7 F (37.1 C) (Oral)   Resp 16   Ht 1.803 m (5\' 11" )   Wt 59 kg (130 lb)   SpO2 96%   BMI 18.13 kg/m   Physical Exam CONSTITUTIONAL: elderly, no acute distress HEAD: Normocephalic/atraumatic EYES: EOMI ENMT: Mucous membranes moist NECK: supple no meningeal signs SPINE/BACK:entire spine nontender CV:  no murmurs/rubs/gallops noted LUNGS: Lungs are clear to auscultation bilaterally, no apparent distress ABDOMEN: soft, nontender  GU:no cva tenderness NEURO: Pt is awake/alert/appropriate, moves all extremitiesx4.  No facial droop.   EXTREMITIES: pulses normal/equal in bilateral LE.  Left LE is shortened and externally rotated.  Tenderness with ROM of  Left hip.  All other extremities/joints palpated/ranged and nontender SKIN: warm, color normal PSYCH: no abnormalities of mood noted, alert and oriented to situation  Rectal (pt with new anemia) - no blood/melena noted, chaperone present ED Treatments / Results  Labs (all labs ordered are listed, but only abnormal results are displayed) Labs Reviewed  BASIC METABOLIC PANEL - Abnormal; Notable for the following:       Result Value   Chloride 100 (*)    Glucose, Bld 138 (*)    Calcium 8.3 (*)    All other components within normal limits  CBC WITH DIFFERENTIAL/PLATELET - Abnormal; Notable for the following:    RBC 3.04 (*)    Hemoglobin 8.9 (*)    HCT 27.7 (*)    RDW 21.4 (*)    Lymphs Abs 0.5 (*)    All other components within normal limits  PROTIME-INR - Abnormal; Notable for the following:    Prothrombin Time 21.2 (*)    All other components within normal limits  PHENYTOIN LEVEL, TOTAL -  Abnormal; Notable for the following:    Phenytoin Lvl 25.2 (*)    All other components within normal limits  POC OCCULT BLOOD, ED - Abnormal; Notable for the following:    Fecal Occult Bld POSITIVE (*)    All other components within normal limits  TYPE AND SCREEN    EKG  EKG Interpretation  Date/Time:  Monday December 06 2016 00:53:36 EDT Ventricular Rate:  80 PR Interval:    QRS Duration: 114 QT Interval:  394 QTC Calculation: 455 R Axis:   76 Text Interpretation:  Sinus rhythm Incomplete right bundle branch block Minimal ST elevation, inferior leads No significant change since last tracing Confirmed by Ripley Fraise [phone]) on 12/06/2016 1:01:23 AM       Radiology Dg Chest 1 View  Result Date: 12/06/2016 CLINICAL DATA:  Pain after a fall.  Left hip fracture. EXAM: CHEST 1 VIEW COMPARISON:  12/08/2015 FINDINGS: Normal heart size and pulmonary vascularity. Diffuse emphysematous changes in the lungs. No airspace disease or consolidation. No blunting  of costophrenic angles. No pneumothorax. Mediastinal contours appear intact. Degenerative changes in the shoulders. IMPRESSION: Emphysematous changes in the lungs. No evidence of active pulmonary disease. Electronically Signed   By: Lucienne Capers [name]   On: 12/06/2016 01:23   Dg Hip Unilat With Pelvis 2-3 Views Left  Result Date: 12/06/2016 CLINICAL DATA:  Pain after a fall. EXAM: DG HIP (WITH OR WITHOUT PELVIS) 2-3V LEFT COMPARISON:  None. FINDINGS: Diffuse bone demineralization. There is an acute comminuted fracture of the inter trochanteric left hip with focal extension to the sub trochanteric region. There is associated varus angulation of the left proximal femur. Displaced lesser trochanteric fragment. Degenerative changes in both hips and in the lower lumbar spine. Pelvis appears intact. SI joints and symphysis pubis are not displaced. Calcified phleboliths in the pelvis. IMPRESSION: Acute comminuted fracture of the inter  trochanteric left hip with varus angulation. Electronically Signed   By: Lucienne Capers [name]   On: 12/06/2016 01:22    Procedures Procedures (including critical care time)  Medications Ordered in ED Medications  fentaNYL (SUBLIMAZE) injection 25 mcg (25 mcg Intravenous Given 12/06/16 0050)     Initial Impression / Assessment and Plan / ED Course  I have reviewed the triage vital signs and the nursing notes.  Pertinent labs & imaging results that were available during my care of the patient were reviewed by me and considered in my medical decision making (see chart for details).     2:09 AM Pt with isolated left hip fracture No other acute complaints He is mildly anemic, but suspect chronic, no blood/melena in stool Vitals appropriate D/w dr tim murphy with ortho Keep NPO, he will be seen in the morning Will admit to hospitalist 2:20 AM D/w dr danford for admission Pt updated on plan  Final Clinical Impressions(s) / ED Diagnoses   Final diagnoses:  Fall, initial encounter  Closed left hip fracture, initial encounter ([facility])  Dilantin toxicity, accidental or unintentional, initial encounter    New Prescriptions New Prescriptions   No medications on file     Ripley Fraise, MD 12/06/16 [phone]

## 2016-12-06 NOTE — ED Notes (Signed)
Patient transported to X-ray 

## 2016-12-07 ENCOUNTER — Encounter (HOSPITAL_COMMUNITY)
Admission: EM | Disposition: A | Discharge status: Skilled Nursing Facility | Payer: Self-pay | Source: Home / Self Care | Attending: Internal Medicine

## 2016-12-07 ENCOUNTER — Encounter (HOSPITAL_COMMUNITY): Payer: Self-pay | Admitting: *Deleted

## 2016-12-07 ENCOUNTER — Inpatient Hospital Stay (HOSPITAL_COMMUNITY): Payer: Medicare Other | Admitting: Anesthesiology

## 2016-12-07 ENCOUNTER — Inpatient Hospital Stay (HOSPITAL_COMMUNITY): Payer: Medicare Other

## 2016-12-07 DIAGNOSIS — W19XXXA Unspecified fall, initial encounter: Secondary | ICD-10-CM

## 2016-12-07 HISTORY — PX: FEMUR IM NAIL: SHX1597

## 2016-12-07 LAB — PROTIME-INR
INR: 2.61
Prothrombin Time: 27.8 seconds — ABNORMAL HIGH (ref 11.4–15.2)

## 2016-12-07 LAB — BASIC METABOLIC PANEL
Anion gap: 6 (ref 5–15)
BUN: 11 mg/dL (ref 6–20)
CALCIUM: 7.6 mg/dL — AB (ref 8.9–10.3)
CHLORIDE: 103 mmol/L (ref 101–111)
CO2: 26 mmol/L (ref 22–32)
CREATININE: 0.71 mg/dL (ref 0.61–1.24)
GFR calc Af Amer: >60 mL/min (ref >60)
GFR calc non Af Amer: >60 mL/min (ref >60)
GLUCOSE: 102 mg/dL — AB (ref 65–99)
Potassium: 3.6 mmol/L (ref 3.5–5.1)
Sodium: 135 mmol/L (ref 135–145)

## 2016-12-07 LAB — CBC
HEMATOCRIT: 22.7 % — AB (ref 39.0–52.0)
HEMOGLOBIN: 7.2 g/dL — AB (ref 13.0–17.0)
MCH: 28.9 pg (ref 26.0–34.0)
MCHC: 31.7 g/dL (ref 30.0–36.0)
MCV: 91.2 fL (ref 78.0–100.0)
Platelets: 165 10*3/uL (ref 150–400)
RBC: 2.49 MIL/uL — ABNORMAL LOW (ref 4.22–5.81)
RDW: 21.4 % — AB (ref 11.5–15.5)
WBC: 6.4 10*3/uL (ref 4.0–10.5)

## 2016-12-07 LAB — SURGICAL PCR SCREEN
MRSA, PCR: NEGATIVE
Staphylococcus aureus: NEGATIVE

## 2016-12-07 LAB — PREPARE RBC (CROSSMATCH)

## 2016-12-07 SURGERY — INSERTION, INTRAMEDULLARY ROD, FEMUR
Anesthesia: General | Site: Hip | Laterality: Left

## 2016-12-07 MED ORDER — FAMOTIDINE IN NACL 20-0.9 MG/50ML-% IV SOLN: 20.0000 mg | Freq: Two times a day (BID) | INTRAVENOUS | Status: DC

## 2016-12-07 MED ORDER — OXYCODONE HCL 5 MG/5ML PO SOLN: 5.0000 mg | Freq: Once | ORAL | Status: DC | PRN

## 2016-12-07 MED ORDER — HYDROCODONE-ACETAMINOPHEN 5-325 MG PO TABS: 1.0000 | ORAL_TABLET | Freq: Four times a day (QID) | ORAL | 0 refills | Status: DC | PRN

## 2016-12-07 MED ORDER — FENTANYL CITRATE (PF) 100 MCG/2ML IJ SOLN
25.0000 ug | INTRAMUSCULAR | Status: DC | PRN
  Administered 2016-12-07 (×2): 50 ug via INTRAVENOUS

## 2016-12-07 MED ORDER — FENTANYL CITRATE (PF) 100 MCG/2ML IJ SOLN
INTRAMUSCULAR | Status: DC | PRN
Start: 2016-12-07 — End: 2016-12-07
  Administered 2016-12-07: 50 ug via INTRAVENOUS
  Administered 2016-12-07: 25 ug via INTRAVENOUS

## 2016-12-07 MED ORDER — SUGAMMADEX SODIUM 200 MG/2ML IV SOLN
INTRAVENOUS | Status: DC | PRN
  Administered 2016-12-07: 150 mg via INTRAVENOUS

## 2016-12-07 MED ORDER — KCL IN DEXTROSE-NACL 20-5-0.9 MEQ/L-%-% IV SOLN
INTRAVENOUS | Status: DC
  Administered 2016-12-07: 21:00:00 via INTRAVENOUS
  Filled 2016-12-07 (×3): qty 1000

## 2016-12-07 MED ORDER — PROPOFOL 10 MG/ML IV BOLUS
INTRAVENOUS | Status: AC
  Filled 2016-12-07: qty 20

## 2016-12-07 MED ORDER — ROCURONIUM BROMIDE 100 MG/10ML IV SOLN
INTRAVENOUS | Status: DC | PRN
  Administered 2016-12-07: 50 mg via INTRAVENOUS

## 2016-12-07 MED ORDER — 0.9 % SODIUM CHLORIDE (POUR BTL) OPTIME
TOPICAL | Status: DC | PRN
  Administered 2016-12-07: 1000 mL

## 2016-12-07 MED ORDER — FENTANYL CITRATE (PF) 250 MCG/5ML IJ SOLN
INTRAMUSCULAR | Status: AC
  Filled 2016-12-07: qty 5

## 2016-12-07 MED ORDER — FAMOTIDINE IN NACL 20-0.9 MG/50ML-% IV SOLN
20.0000 mg | INTRAVENOUS | Status: DC
  Administered 2016-12-08 – 2016-12-09 (×2): 20 mg via INTRAVENOUS
  Filled 2016-12-07 (×3): qty 50

## 2016-12-07 MED ORDER — CEFAZOLIN SODIUM-DEXTROSE 1-4 GM/50ML-% IV SOLN
1.0000 g | Freq: Four times a day (QID) | INTRAVENOUS | Status: AC
  Administered 2016-12-07 – 2016-12-08 (×3): 1 g via INTRAVENOUS
  Filled 2016-12-07 (×4): qty 50

## 2016-12-07 MED ORDER — WARFARIN - PHARMACIST DOSING INPATIENT: Freq: Every day | Status: DC

## 2016-12-07 MED ORDER — CEFAZOLIN SODIUM-DEXTROSE 2-3 GM-% IV SOLR
INTRAVENOUS | Status: DC | PRN
  Administered 2016-12-07: 2 g via INTRAVENOUS

## 2016-12-07 MED ORDER — ENOXAPARIN SODIUM 40 MG/0.4ML ~~LOC~~ SOLN: 40.0000 mg | SUBCUTANEOUS | Status: DC

## 2016-12-07 MED ORDER — OXYCODONE HCL 5 MG PO TABS: 5.0000 mg | ORAL_TABLET | Freq: Once | ORAL | Status: DC | PRN

## 2016-12-07 MED ORDER — LACTATED RINGERS IV SOLN
INTRAVENOUS | Status: DC | PRN
  Administered 2016-12-07: 13:00:00 via INTRAVENOUS

## 2016-12-07 MED ORDER — MEPERIDINE HCL 25 MG/ML IJ SOLN: 6.2500 mg | INTRAMUSCULAR | Status: DC | PRN

## 2016-12-07 MED ORDER — FENTANYL CITRATE (PF) 100 MCG/2ML IJ SOLN: 25.0000 ug | INTRAMUSCULAR | Status: DC | PRN

## 2016-12-07 MED ORDER — PROPOFOL 10 MG/ML IV BOLUS
INTRAVENOUS | Status: DC | PRN
  Administered 2016-12-07: 110 mg via INTRAVENOUS

## 2016-12-07 MED ORDER — ACETAMINOPHEN 325 MG PO TABS
650.0000 mg | ORAL_TABLET | Freq: Four times a day (QID) | ORAL | Status: DC | PRN
  Administered 2016-12-07 – 2016-12-14 (×7): 650 mg via ORAL
  Filled 2016-12-07 (×8): qty 2

## 2016-12-07 MED ORDER — WARFARIN SODIUM 5 MG PO TABS
2.5000 mg | ORAL_TABLET | Freq: Once | ORAL | Status: AC
  Administered 2016-12-07: 2.5 mg via ORAL
  Filled 2016-12-07: qty 1

## 2016-12-07 MED ORDER — PHENYLEPHRINE HCL 10 MG/ML IJ SOLN
INTRAMUSCULAR | Status: DC | PRN
  Administered 2016-12-07 (×2): 80 ug via INTRAVENOUS

## 2016-12-07 MED ORDER — FENTANYL CITRATE (PF) 100 MCG/2ML IJ SOLN
INTRAMUSCULAR | Status: AC
  Administered 2016-12-07: 50 ug via INTRAVENOUS
  Filled 2016-12-07: qty 2

## 2016-12-07 MED ORDER — ONDANSETRON HCL 4 MG/2ML IJ SOLN
INTRAMUSCULAR | Status: DC | PRN
  Administered 2016-12-07: 4 mg via INTRAVENOUS

## 2016-12-07 MED ORDER — LIDOCAINE HCL (CARDIAC) 20 MG/ML IV SOLN
INTRAVENOUS | Status: DC | PRN
  Administered 2016-12-07: 60 mg via INTRAVENOUS

## 2016-12-07 MED ORDER — ONDANSETRON HCL 4 MG/2ML IJ SOLN: 4.0000 mg | Freq: Four times a day (QID) | INTRAMUSCULAR | Status: DC | PRN

## 2016-12-07 SURGICAL SUPPLY — 34 items
BIT DRILL AO GAMMA 4.2X180 (BIT) ×1 IMPLANT
CLSR STERI-STRIP ANTIMIC 1/2X4 (GAUZE/BANDAGES/DRESSINGS) ×2 IMPLANT
COVER PERINEAL POST (MISCELLANEOUS) ×2 IMPLANT
COVER SURGICAL LIGHT HANDLE (MISCELLANEOUS) ×2 IMPLANT
DRAPE STERI IOBAN 125X83 (DRAPES) ×2 IMPLANT
DRSG MEPILEX BORDER 4X4 (GAUZE/BANDAGES/DRESSINGS) ×3 IMPLANT
DURAPREP 26ML APPLICATOR (WOUND CARE) ×2 IMPLANT
ELECT REM PT RETURN 9FT ADLT (ELECTROSURGICAL) ×2
ELECTRODE REM PT RTRN 9FT ADLT (ELECTROSURGICAL) ×1 IMPLANT
GLOVE BIO SURGEON STRL SZ7.5 (GLOVE) ×4 IMPLANT
GLOVE BIOGEL PI IND STRL 8 (GLOVE) ×2 IMPLANT
GLOVE BIOGEL PI INDICATOR 8 (GLOVE) ×3
GOWN STRL REUS W/ TWL LRG LVL3 (GOWN DISPOSABLE) ×3 IMPLANT
GOWN STRL REUS W/TWL LRG LVL3 (GOWN DISPOSABLE) ×6
GUIDEROD T2 3X1000 (ROD) ×1 IMPLANT
K-WIRE  3.2X450M STR (WIRE) ×1
K-WIRE 3.2X450M STR (WIRE) ×1
KIT BASIN OR (CUSTOM PROCEDURE TRAY) ×2 IMPLANT
KIT ROOM TURNOVER OR (KITS) ×2 IMPLANT
KWIRE 3.2X450M STR (WIRE) IMPLANT
MANIFOLD NEPTUNE II (INSTRUMENTS) ×2 IMPLANT
NAIL GAMMA 10X420X125 (Nail) ×1 IMPLANT
NS IRRIG 1000ML POUR BTL (IV SOLUTION) ×2 IMPLANT
PACK GENERAL/GYN (CUSTOM PROCEDURE TRAY) ×2 IMPLANT
PAD ARMBOARD 7.5X6 YLW CONV (MISCELLANEOUS) ×4 IMPLANT
SCREW LAG GAMMA 3 110MM (Screw) ×1 IMPLANT
SCREW LOCKING T2 F/T  5MMX40MM (Screw) ×1 IMPLANT
SCREW LOCKING T2 F/T 5MMX40MM (Screw) IMPLANT
SUT MNCRL AB 4-0 PS2 18 (SUTURE) ×1 IMPLANT
SUT MON AB 2-0 CT1 36 (SUTURE) IMPLANT
SUT VIC AB 0 CT1 27 (SUTURE) ×2
SUT VIC AB 0 CT1 27XBRD ANBCTR (SUTURE) ×1 IMPLANT
TOWEL OR 17X24 6PK STRL BLUE (TOWEL DISPOSABLE) ×2 IMPLANT
TOWEL OR 17X26 10 PK STRL BLUE (TOWEL DISPOSABLE) ×2 IMPLANT

## 2016-12-07 NOTE — H&P (View-Only) (Signed)
ORTHOPAEDIC CONSULTATION  REQUESTING PHYSICIAN: Rosita Fire, MD  Chief Complaint: Left hip fracture   HPI: [name] is a 76 y.o. male who complains of  A mechanical fall yesterday with left hip pain.   Past Medical History:  Diagnosis Date  . Hiatal hernia   . HTN (hypertension)    pt denies h/o HTN though it appears he was started on spironolactone during his hospitalization 3/13  . Pancreatitis   . Seizures ([place])   . Stroke ([place])   . Typical atrial flutter ([place]) 3/13   Past Surgical History:  Procedure Laterality Date  . ESOPHAGOGASTRODUODENOSCOPY N/A 11/30/2015   Procedure: ESOPHAGOGASTRODUODENOSCOPY (EGD);  Surgeon: Wonda Horner, MD;  Location: [facility] ENDOSCOPY;  Service: Endoscopy;  Laterality: N/A;  . no surgical history  06/2011   Social History   Social History  . Marital status: Divorced    Spouse name: N/A  . Number of children: N/A  . Years of education: N/A   Social History Main Topics  . Smoking status: Former Smoker    Quit date: 07/15/1966  . Smokeless tobacco: Never Used  . Alcohol use No     Comment: Quit greater than 5 years ago  . Drug use: No  . Sexual activity: Not Asked   Other Topics Concern  . None   Social History Narrative   Lives alone.  Retired from [employer]   Family History  Problem Relation Age of Onset  . Hypertension Unknown    No Known Allergies Prior to Admission medications   Medication Sig Start Date End Date Taking? Authorizing Provider  levETIRAcetam (KEPPRA) 500 MG tablet Take 3 tablets twice a day 11/12/16  Yes Cameron Sprang, MD  phenytoin (DILANTIN) 100 MG ER capsule Take 3 capsules (300 mg total) by mouth daily. 11/12/16  Yes Cameron Sprang, MD  predniSONE (DELTASONE) 10 MG tablet Take 10-30 mg by mouth See admin instructions. Alternate taking with 60m and 355m8/31/18  Yes [provider]  warfarin (COUMADIN) 2.5 MG tablet TAKE 1 OR 2 TABLETS BY MOUTH DAILY AS DIRECTED BY  COUMADIN CLINIC Patient taking differently: Take 2.5-5 mg by mouth See admin instructions. TAKE 1 OR 2 TABLETS BY MOUTH DAILY AS DIRECTED BY COUMADIN CLINIC 11/10/16  Yes AlThompson GrayerMD   Dg Chest 1 View  Result Date: 12/06/2016 CLINICAL DATA:  Pain after a fall.  Left hip fracture. EXAM: CHEST 1 VIEW COMPARISON:  12/08/2015 FINDINGS: Normal heart size and pulmonary vascularity. Diffuse emphysematous changes in the lungs. No airspace disease or consolidation. No blunting of costophrenic angles. No pneumothorax. Mediastinal contours appear intact. Degenerative changes in the shoulders. IMPRESSION: Emphysematous changes in the lungs. No evidence of active pulmonary disease. Electronically Signed   By: WiLucienne Capers.D.   On: 12/06/2016 01:23   Dg Hip Unilat With Pelvis 2-3 Views Left  Result Date: 12/06/2016 CLINICAL DATA:  Pain after a fall. EXAM: DG HIP (WITH OR WITHOUT PELVIS) 2-3V LEFT COMPARISON:  None. FINDINGS: Diffuse bone demineralization. There is an acute comminuted fracture of the inter trochanteric left hip with focal extension to the sub trochanteric region. There is associated varus angulation of the left proximal femur. Displaced lesser trochanteric fragment. Degenerative changes in both hips and in the lower lumbar spine. Pelvis appears intact. SI joints and symphysis pubis are not displaced. Calcified phleboliths in the pelvis. IMPRESSION: Acute comminuted fracture of the inter trochanteric left hip with varus angulation. Electronically Signed  By: Lucienne Capers [name]   On: 12/06/2016 01:22    Positive ROS: All other systems have been reviewed and were otherwise negative with the exception of those mentioned in the HPI and as above.  Labs cbc  Recent Labs  12/05/16 2355  WBC 5.3  HGB 8.9*  HCT 27.7*  PLT 257    Labs inflam No results for input(s): CRP in the last 72 hours.  Invalid input(s): ESR  Labs coag  Recent Labs  12/05/16 2355  INR 1.85      Recent Labs  12/05/16 2355  NA 136  K 4.2  CL 100*  CO2 29  GLUCOSE 138*  BUN 13  CREATININE 0.74  CALCIUM 8.3*    Physical Exam: Vitals:   12/06/16 0700 12/06/16 0715  BP: 120/66 114/65  Pulse: 89 94  Resp: 16 20  Temp:    SpO2: 99% 95%   General: Alert, no acute distress Cardiovascular: No pedal edema Respiratory: No cyanosis, no use of accessory musculature GI: No organomegaly, abdomen is soft and non-tender Skin: No lesions in the area of chief complaint other than those listed below in MSK exam.  Neurologic: Sensation intact distally save for the below mentioned MSK exam Psychiatric: Patient is competent for consent with normal mood and affect Lymphatic: No axillary or cervical lymphadenopathy  MUSCULOSKELETAL:  LLE: compartments soft, NVI, pain with ROM Other extremities are atraumatic with painless ROM and NVI.  Assessment: Left intertroch fracture  Plan: IM nail L hip today   Renette Butters, MD Cell [phone]   12/06/2016 8:24 AM

## 2016-12-07 NOTE — Anesthesia Procedure Notes (Signed)
Procedure Name: Intubation Date/Time: 12/07/2016 1:05 PM Performed by: Lavell Luster Pre-anesthesia Checklist: Patient identified, Emergency Drugs available, Suction available and Patient being monitored Patient Re-evaluated:Patient Re-evaluated prior to induction Oxygen Delivery Method: Circle System Utilized Preoxygenation: Pre-oxygenation with 100% oxygen Induction Type: IV induction Ventilation: Mask ventilation without difficulty Laryngoscope Size: Mac and 4 Grade View: Grade I Tube type: Oral Tube size: 7.5 mm Number of attempts: 1 Airway Equipment and Method: Stylet and Oral airway Placement Confirmation: ETT inserted through vocal cords under direct vision,  positive ETCO2 and breath sounds checked- equal and bilateral Secured at: 23 cm Tube secured with: Tape Dental Injury: Teeth and Oropharynx as per pre-operative assessment

## 2016-12-07 NOTE — Progress Notes (Signed)
Lelon Perla, CRNA took over blood adminstration.

## 2016-12-07 NOTE — Interval H&P Note (Signed)
History and Physical Interval Note:  12/07/2016 9:04 AM  [name]  has presented today for surgery, with the diagnosis of Left Hip Fracture  The various methods of treatment have been discussed with the patient and family. After consideration of risks, benefits and other options for treatment, the patient has consented to  Procedure(s): INTRAMEDULLARY (IM) NAIL FEMORAL (Left) as a surgical intervention .  The patient's history has been reviewed, patient examined, no change in status, stable for surgery.  I have reviewed the patient's chart and labs.  Questions were answered to the patient's satisfaction.     Marrian Bells D

## 2016-12-07 NOTE — Progress Notes (Addendum)
PROGRESS NOTE    [name] [facility]  YSA:[id number] DOB: 12-31-1940 DOA: 12/05/2016 PCP: [name], MD   Brief Narrative: 76 year old male with history of prior stroke, mild dementia, seizure disorder, atrial flutter, history of GI bleed presented after a fall sustaining hip pain. Found to have left intertrochanteric hip fracture. Evaluated by orthopedics.  Assessment & Plan:   #  Closed fracture of femur, intertrochanteric, left, initial encounter [facility]): -likely OR today as per ortho. Pt is currently NPO. -change IVF to D5 NS KCL.  -continue to hold Coumadin. -Pain management. -Follow-up orthopedic's plan.  # Acute encephalopathy likely delirium: -mentally status has improved this morning. Patient was alert awake and oriented to hospital, name and why he is in the hospital. He was upset that he is hungry and hasn't gone for surgery yet.continue to monitor.  -Frequent reorientation -CT scan of head showed a primary stroke and small meningioma without surrounding vasogenic edema or mass effect.I discussed this with the patient's daughter yesterday. Recommended outpatient follow-up. Continue supportive care.  #acute fever last night likely in the setting of fracture: UA negative. Chest x-ray with no pneumonia.continue Tylenol and supportive care. Patient with no new symptoms today. Monitor fever curve.  # Acute normocytic anemia ? Unknown ? Dilution ? Blood loss: no bruises or active bleeding noticed. Plan for 2 units of red blood cell transfusion. Monitor CBC.  Discussed with the patient's nurse and with the patient. -patient has prior history of GI bleed and currently on Coumadin. Recommended to follow-up with GI outpatient.  #  Essential hypertension: Monitor blood pressure closely.  # Atrial flutter ([place]), type unspecified: Monitor heart rate. Coumadin on hold. INR 2.61. Defer to surgeon if patient needs FFP before surgery. Check lab in am  #Seizure disorder: Continue  Dilantin, Keppra. Changed to IV form. Elevated dilantin level on admission. Recheck in am. Seizure and fall precaution.    DVT prophylaxis: coumadin on hold. INR 2.6 Code Status: Full code Family Communication: Discussed with patient's daughter over the phone yesterday Disposition Plan: Currently admitted    Consultants:   Orthopedics  Procedures: None Antimicrobials: None  Subjective: Seen and examined at bedside. He was alert awake and oriented to his name and hospital. Denied pain, nausea, vomiting, chest pain, shortness of breath. No abdominal pain.  Objective: Vitals:   12/06/16 1955 12/07/16 0451 12/07/16 0800 12/07/16 1027  BP: (!) 115/52 (!) 117/51  (!) 100/52  Pulse: (!) 108 89  70  Resp: 19 18  17   Temp: (!) 101.2 F (38.4 C) 100 F (37.8 C) 97.7 F (36.5 C) 98.3 F (36.8 C)  TempSrc: Axillary Oral Oral Oral  SpO2: 96% 97%  99%  Weight:      Height:        Intake/Output Summary (Last 24 hours) at 12/07/16 1044 Last data filed at 12/07/16 0900  Gross per 24 hour  Intake              480 ml  Output             1150 ml  Net             -670 ml   Filed Weights   12/05/16 2317  Weight: 59 kg (130 lb)    Examination:  General exam: not in distress Respiratory system: clear bilateral, respiratory effort normal. No wheezing or crackles Cardiovascular system: regular rate rhythm, S1-S2 normal.   Gastrointestinal system: abdomen soft, nontender, nondistended. Bowel sound positive Central nervous system: Alert awake  and oriented to name, hospital and knows why he is in the hospital Extremities: No edema Skin: No rashes, lesions or ulcers    Data Reviewed: I have personally reviewed following labs and imaging studies  CBC:  Recent Labs Lab 12/05/16 2355 12/07/16 0319  WBC 5.3 6.4  NEUTROABS 3.9  --   HGB 8.9* 7.2*  HCT 27.7* 22.7*  MCV 91.1 91.2  PLT 257 161   Basic Metabolic Panel:  Recent Labs Lab 12/05/16 2355 12/07/16 0319  NA 136 135   K 4.2 3.6  CL 100* 103  CO2 29 26  GLUCOSE 138* 102*  BUN 13 11  CREATININE 0.74 0.71  CALCIUM 8.3* 7.6*   GFR: Estimated Creatinine Clearance: 64.5 mL/min (by C-G formula based on SCr of 0.71 mg/dL). Liver Function Tests: No results for input(s): AST, ALT, ALKPHOS, BILITOT, PROT, ALBUMIN in the last 168 hours. No results for input(s): LIPASE, AMYLASE in the last 168 hours. No results for input(s): AMMONIA in the last 168 hours. Coagulation Profile:  Recent Labs Lab 12/05/16 2355 12/07/16 0319  INR 1.85 2.61   Cardiac Enzymes: No results for input(s): CKTOTAL, CKMB, CKMBINDEX, TROPONINI in the last 168 hours. BNP (last 3 results) No results for input(s): PROBNP in the last 8760 hours. HbA1C: No results for input(s): HGBA1C in the last 72 hours. CBG:  Recent Labs Lab 12/06/16 1146  GLUCAP 107*   Lipid Profile: No results for input(s): CHOL, HDL, LDLCALC, TRIG, CHOLHDL, LDLDIRECT in the last 72 hours. Thyroid Function Tests: No results for input(s): TSH, T4TOTAL, FREET4, T3FREE, THYROIDAB in the last 72 hours. Anemia Panel: No results for input(s): VITAMINB12, FOLATE, FERRITIN, TIBC, IRON, RETICCTPCT in the last 72 hours. Sepsis Labs: No results for input(s): PROCALCITON, LATICACIDVEN in the last 168 hours.  Recent Results (from the past 240 hour(s))  Surgical pcr screen     Status: None   Collection Time: 12/07/16  3:04 AM  Result Value Ref Range Status   MRSA, PCR NEGATIVE NEGATIVE Final   Staphylococcus aureus NEGATIVE NEGATIVE Final    Comment: (NOTE) The Xpert SA Assay (FDA approved for NASAL specimens in patients 71 years of age and older), is one component of a comprehensive surveillance program. It is not intended to diagnose infection nor to guide or monitor treatment.          Radiology Studies: Dg Chest 1 View  Result Date: 12/06/2016 CLINICAL DATA:  Pain after a fall.  Left hip fracture. EXAM: CHEST 1 VIEW COMPARISON:  12/08/2015 FINDINGS:  Normal heart size and pulmonary vascularity. Diffuse emphysematous changes in the lungs. No airspace disease or consolidation. No blunting of costophrenic angles. No pneumothorax. Mediastinal contours appear intact. Degenerative changes in the shoulders. IMPRESSION: Emphysematous changes in the lungs. No evidence of active pulmonary disease. Electronically Signed   By: Lucienne Capers [name]   On: 12/06/2016 01:23   Ct Head Wo Contrast  Result Date: 12/06/2016 CLINICAL DATA:  76 year old hypertensive male with sudden onset of man status changes. Initial encounter. EXAM: CT HEAD WITHOUT CONTRAST TECHNIQUE: Contiguous axial images were obtained from the base of the skull through the vertex without intravenous contrast. COMPARISON:  09/19/2012 CT. FINDINGS: Brain: No intracranial hemorrhage or CT evidence of large acute infarct. Remote left temporal lobe infarct with encephalomalacia. Chronic microvascular changes. Global atrophy without hydrocephalus. Right frontal region 1.2 x 1.2 x 0.4 cm extra-axial mass with partial calcifications suggestive of small meningioma without surrounding vasogenic edema or mass effect. Vascular: Vascular calcifications Skull:  No acute abnormality Sinuses/Orbits: No acute orbital abnormality. Mucosal thickening maxillary and ethmoid sinus air cells. Other: Mastoid air cells and middle ear cavities are clear. IMPRESSION: No intracranial hemorrhage or CT evidence of large acute infarct. Remote left temporal lobe infarct. Chronic microvascular changes.  Global atrophy. Right frontal region 1.2 x 1.2 x 0.4 cm extra-axial mass with partial calcifications suggestive of small meningioma without surrounding vasogenic edema or mass effect. This is not well delineated on prior exams. Mucosal thickening maxillary and ethmoid sinus air cells. Electronically Signed   By: Genia Del [name]   On: 12/06/2016 12:33   Dg Hip Unilat With Pelvis 2-3 Views Left  Result Date: 12/06/2016 CLINICAL DATA:   Pain after a fall. EXAM: DG HIP (WITH OR WITHOUT PELVIS) 2-3V LEFT COMPARISON:  None. FINDINGS: Diffuse bone demineralization. There is an acute comminuted fracture of the inter trochanteric left hip with focal extension to the sub trochanteric region. There is associated varus angulation of the left proximal femur. Displaced lesser trochanteric fragment. Degenerative changes in both hips and in the lower lumbar spine. Pelvis appears intact. SI joints and symphysis pubis are not displaced. Calcified phleboliths in the pelvis. IMPRESSION: Acute comminuted fracture of the inter trochanteric left hip with varus angulation. Electronically Signed   By: Lucienne Capers [name]   On: 12/06/2016 01:22        Scheduled Meds: . docusate sodium  100 mg Oral BID  . feeding supplement (ENSURE ENLIVE)  237 mL Oral BID BM  . ferrous sulfate  325 mg Oral TID PC  . phenytoin (DILANTIN) IV  100 mg Intravenous Q8H   Continuous Infusions: . sodium chloride 100 mL/hr at 12/06/16 0934  . famotidine (PEPCID) IV Stopped (12/06/16 2335)  . levETIRAcetam 1,500 mg (12/07/16 1021)     LOS: 1 day    Yamile Roedl Tanna Furry, MD [facility] Pager [phone]  If 7PM-7AM, please contact night-coverage [email or web address] Password [facility] 12/07/2016, 10:44 AM

## 2016-12-07 NOTE — Transfer of Care (Signed)
Immediate Anesthesia Transfer of Care Note  Patient: [name]  Procedure(s) Performed: Procedure(s): INTRAMEDULLARY (IM) NAIL FEMORAL (Left)  Patient Location: PACU  Anesthesia Type:General  Level of Consciousness: awake, pateint uncooperative and confused  Airway & Oxygen Therapy: Patient Spontanous Breathing  Post-op Assessment: Report given to RN, Post -op Vital signs reviewed and stable and Patient moving all extremities X 4  Post vital signs: Reviewed and stable  Last Vitals:  Vitals:   12/07/16 1112 12/07/16 1136  BP: (!) 98/57 (!) 104/51  Pulse: 76 80  Resp: 17 18  Temp: 37.1 C 37.1 C  SpO2: 98% 97%    Last Pain:  Vitals:   12/07/16 1136  TempSrc: Oral  PainSc:          Complications: No apparent anesthesia complications

## 2016-12-07 NOTE — Progress Notes (Signed)
ANTICOAGULATION CONSULT NOTE - Initial Consult  Pharmacy Consult for Coumadin  Indication: h/o atrial fluuter  No Known Allergies  Patient Measurements: Height: 5\' 11"  (180.3 cm) Weight: 130 lb (59 kg) IBW/kg (Calculated) : 75.3 Heparin Dosing Weight: 59 kg   Vital Signs: Temp: 99.5 F (37.5 C) (09/18 1637) Temp Source: Oral (09/18 1136) BP: 128/52 (09/18 1637) Pulse Rate: 96 (09/18 1637)  Labs:  Recent Labs  12/05/16 2355 12/07/16 0319  HGB 8.9* 7.2*  HCT 27.7* 22.7*  PLT 257 165  LABPROT 21.2* 27.8*  INR 1.85 2.61  CREATININE 0.74 0.71    Estimated Creatinine Clearance: 64.5 mL/min (by C-G formula based on SCr of 0.71 mg/dL).   Medical History: Past Medical History:  Diagnosis Date  . Hiatal hernia   . HTN (hypertension)    pt denies h/o HTN though it appears he was started on spironolactone during his hospitalization 3/13  . Pancreatitis   . Seizures ([place])   . Stroke ([place])   . Typical atrial flutter ([place]) 3/13    Medications:  Prescriptions Prior to Admission  Medication Sig Dispense Refill Last Dose  . levETIRAcetam (KEPPRA) 500 MG tablet Take 3 tablets twice a day 180 tablet 11 12/05/2016 at Unknown time  . phenytoin (DILANTIN) 100 MG ER capsule Take 3 capsules (300 mg total) by mouth daily. 90 capsule 11 12/05/2016 at Unknown time  . predniSONE (DELTASONE) 10 MG tablet Take 10-30 mg by mouth See admin instructions. Alternate taking with 10mg  and 30mg   2 12/05/2016 at Unknown time  . warfarin (COUMADIN) 2.5 MG tablet TAKE 1 OR 2 TABLETS BY MOUTH DAILY AS DIRECTED BY COUMADIN CLINIC (Patient taking differently: Take 2.5-5 mg by mouth See admin instructions. TAKE 1 OR 2 TABLETS BY MOUTH DAILY AS DIRECTED BY COUMADIN CLINIC) 50 tablet 3 12/05/2016 at Unknown time    Assessment: 76 y.o male on coumadin PTA for h/o atrial flutter. Also h/o CVA.  Last took coumadin 9/16 PTA.   Admitted on 12/06/16 with left hip fracture due to mechanical fall on 12/05/16.  Now s/p  left hip IM nail.   INR was 1.85 on admit 9/16.   No coumadin given 9/17.  Today 9/18 INR = 2.61, therapeutic but did increase from admit, off coumadin x1 day. Hgb 8.9>7.2.  Pltc 257>165.  Transfused 2 units  PRBCs preop today. Ortho PA, Roxan Hockey reported not much blood loss during surgery and it was a closed reduction surgery. Ortho order for pharmacy consult to to restart coumadin tonight for h/o atrial flutter.  PTA coumadin dose 5mg  daily , last taken pta 12/05/16.    Goal of Therapy:  INR 2-3 Monitor platelets by anticoagulation protocol: Yes   Plan:  Restart coumadin tonight Coumadin 2.5 mg po tonight x1 Daily INR If INR falls <2.0, we will follow up with MD to determine if lovenox or heparin bridge needed.    Nicole Cella, RPh Clinical Pharmacist Pager: [phone] 12/07/2016,5:30 PM

## 2016-12-07 NOTE — Op Note (Signed)
DATE OF SURGERY:  12/07/2016  TIME: 1:56 PM  PATIENT NAME:  [name]  AGE: 76 y.o.  PRE-OPERATIVE DIAGNOSIS:  Left Hip Fracture  POST-OPERATIVE DIAGNOSIS:  SAME  PROCEDURE:  INTRAMEDULLARY (IM) NAIL FEMORAL  SURGEON:  Jami Bogdanski D  ASSISTANT:  Roxan Hockey, PA-C, he was present and scrubbed throughout the case, critical for completion in a timely fashion, and for retraction, instrumentation, and closure.   OPERATIVE IMPLANTS: Stryker Gamma Nail  PREOPERATIVE INDICATIONS:  [name] is a 76 y.o. year old who fell and suffered a hip fracture. He was brought into the ER and then admitted and optimized and then elected for surgical intervention.    The risks benefits and alternatives were discussed with the patient including but not limited to the risks of nonoperative treatment, versus surgical intervention including infection, bleeding, nerve injury, malunion, nonunion, hardware prominence, hardware failure, need for hardware removal, blood clots, cardiopulmonary complications, morbidity, mortality, among others, and they were willing to proceed.    OPERATIVE PROCEDURE:  The patient was brought to the operating room and placed in the supine position. General anesthesia was administered. He was placed on the fracture table.  Closed reduction was performed under C-arm guidance. Time out was then performed after sterile prep and drape. He received preoperative antibiotics.  Incision was made proximal to the greater trochanter. A guidewire was placed in the appropriate position. Confirmation was made on AP and lateral views. The above-named nail was opened. I opened the proximal femur with a reamer. I then placed the nail by hand easily down. I did not need to ream the femur.  Once the nail was completely seated, I placed a guidepin into the femoral head into the center center position. I measured the length, and then reamed the lateral cortex and up into the  head. I then placed the lag screw. Slight compression was applied. Anatomic fixation achieved. Bone quality was mediocre.  I then secured the proximal interlocking bolt, and took off a half a turn, and then removed the instruments, and took final C-arm pictures AP and lateral the entire length of the leg.  I then used perfect circles technique to place a distal interlock screw.   Anatomic reconstruction was achieved, and the wounds were irrigated copiously and closed with Vicryl followed by staples and sterile gauze for the skin. The patient was awakened and returned to PACU in stable and satisfactory condition. There no complications and the patient tolerated the procedure well.  He will be weightbearing as tolerated, and will be on chemical px  for a period of four weeks after discharge.   [name], [name]

## 2016-12-08 ENCOUNTER — Encounter (HOSPITAL_COMMUNITY): Payer: Self-pay | Admitting: General Practice

## 2016-12-08 DIAGNOSIS — I4892 Unspecified atrial flutter: Secondary | ICD-10-CM

## 2016-12-08 HISTORY — PX: FEMUR IM NAIL: SHX1597

## 2016-12-08 LAB — CBC
HCT: 22.1 % — ABNORMAL LOW (ref 39.0–52.0)
HEMATOCRIT: 22.1 % — AB (ref 39.0–52.0)
HEMOGLOBIN: 7.3 g/dL — AB (ref 13.0–17.0)
HEMOGLOBIN: 7.4 g/dL — AB (ref 13.0–17.0)
MCH: 29.6 pg (ref 26.0–34.0)
MCH: 30.2 pg (ref 26.0–34.0)
MCHC: 33 g/dL (ref 30.0–36.0)
MCHC: 33.5 g/dL (ref 30.0–36.0)
MCV: 89.5 fL (ref 78.0–100.0)
MCV: 90.2 fL (ref 78.0–100.0)
Platelets: 126 10*3/uL — ABNORMAL LOW (ref 150–400)
Platelets: 125 10*3/uL — ABNORMAL LOW (ref 150–400)
RBC: 2.47 MIL/uL — ABNORMAL LOW (ref 4.22–5.81)
RBC: 2.45 MIL/uL — AB (ref 4.22–5.81)
RDW: 20.1 % — AB (ref 11.5–15.5)
RDW: 20.1 % — ABNORMAL HIGH (ref 11.5–15.5)
WBC: 9 10*3/uL (ref 4.0–10.5)
WBC: 8.4 10*3/uL (ref 4.0–10.5)

## 2016-12-08 LAB — COMPREHENSIVE METABOLIC PANEL
ALK PHOS: 49 U/L (ref 38–126)
ALT: 12 U/L — ABNORMAL LOW (ref 17–63)
ANION GAP: 10 (ref 5–15)
AST: 27 U/L (ref 15–41)
Albumin: 2.7 g/dL — ABNORMAL LOW (ref 3.5–5.0)
BUN: 11 mg/dL (ref 6–20)
CHLORIDE: 106 mmol/L (ref 101–111)
CO2: 20 mmol/L — AB (ref 22–32)
Calcium: 7.4 mg/dL — ABNORMAL LOW (ref 8.9–10.3)
Creatinine, Ser: 0.8 mg/dL (ref 0.61–1.24)
GFR calc non Af Amer: >60 mL/min (ref >60)
GLUCOSE: 164 mg/dL — AB (ref 65–99)
Potassium: 3.6 mmol/L (ref 3.5–5.1)
SODIUM: 136 mmol/L (ref 135–145)
Total Bilirubin: 0.7 mg/dL (ref 0.3–1.2)
Total Protein: 4.9 g/dL — ABNORMAL LOW (ref 6.5–8.1)

## 2016-12-08 LAB — PROTIME-INR
INR: 2.05
PROTHROMBIN TIME: 22.9 s — AB (ref 11.4–15.2)

## 2016-12-08 LAB — PHENYTOIN LEVEL, TOTAL: Phenytoin Lvl: 22 ug/mL — ABNORMAL HIGH (ref 10.0–20.0)

## 2016-12-08 LAB — HEMOGLOBIN AND HEMATOCRIT, BLOOD
HEMATOCRIT: 23.4 % — AB (ref 39.0–52.0)
HEMOGLOBIN: 7.7 g/dL — AB (ref 13.0–17.0)

## 2016-12-08 LAB — PREPARE RBC (CROSSMATCH)

## 2016-12-08 MED ORDER — HYDROCODONE-ACETAMINOPHEN 5-325 MG PO TABS
1.0000 | ORAL_TABLET | Freq: Four times a day (QID) | ORAL | Status: DC | PRN
  Administered 2016-12-09 – 2016-12-15 (×4): 1 via ORAL
  Filled 2016-12-08 (×4): qty 1

## 2016-12-08 MED ORDER — SODIUM CHLORIDE 0.9 % IV SOLN
Freq: Once | INTRAVENOUS | Status: AC
  Administered 2016-12-08: 15:00:00 via INTRAVENOUS

## 2016-12-08 MED ORDER — ENSURE ENLIVE PO LIQD
237.0000 mL | Freq: Three times a day (TID) | ORAL | Status: DC
  Administered 2016-12-08 – 2016-12-15 (×10): 237 mL via ORAL

## 2016-12-08 MED ORDER — PREDNISONE 20 MG PO TABS: 30.0000 mg | ORAL_TABLET | ORAL | Status: DC

## 2016-12-08 MED ORDER — PREDNISONE 10 MG PO TABS
10.0000 mg | ORAL_TABLET | ORAL | Status: DC
  Administered 2016-12-08 – 2016-12-14 (×4): 10 mg via ORAL
  Filled 2016-12-08 (×7): qty 1

## 2016-12-08 MED ORDER — WARFARIN SODIUM 4 MG PO TABS
4.0000 mg | ORAL_TABLET | Freq: Once | ORAL | Status: AC
  Administered 2016-12-08: 4 mg via ORAL
  Filled 2016-12-08: qty 1

## 2016-12-08 MED ORDER — HYDROMORPHONE HCL 1 MG/ML IJ SOLN
0.5000 mg | INTRAMUSCULAR | Status: DC | PRN
  Administered 2016-12-09: 0.5 mg via INTRAVENOUS
  Filled 2016-12-08: qty 1

## 2016-12-08 NOTE — Evaluation (Signed)
Physical Therapy Evaluation Patient Details Name: [name] MRN: [id number] DOB: [birth year] Today's Date: 12/08/2016   History of Present Illness  [name] is a 76 y.o. male with a past medical history significant for hx of CVA with residual seizures on Phenytoin/Keppra, Aflutter, dementia and hx of GIB who presents with hip pain after a fall.  Positive for left intertrochanteric hip fracture, now s/p IM nail.   Clinical Impression  Patient presents with decreased mobility due to weakness, pain, limited tolerance to activity with orthostatic BP (115/56 supine, 68/38 sitting), poor safety awareness, decreased ROM and strength L LE and currently max A for EOB activity.  Feel he will need SNF level rehab at d/c.  PT to follow acutely.    Follow Up Recommendations SNF    Equipment Recommendations  Other (comment) (TBA next venue)    Recommendations for Other Services       Precautions / Restrictions Precautions Precautions: Fall Precaution Comments: orthostatic on eval Restrictions Weight Bearing Restrictions: Yes LLE Weight Bearing: Weight bearing as tolerated      Mobility  Bed Mobility Overal bed mobility: Needs Assistance Bed Mobility: Supine to Sit;Sit to Supine     Supine to sit: Max assist;HOB elevated Sit to supine: Max assist   General bed mobility comments: assist for scooting with cues, increased time, then assist for L LE, cues for moving R LE, heavy lifting help to lift trunk; to supine assist for scooting back on bed, lifting L LE and lowering trunk  Transfers                 General transfer comment: NT due to BP 68/38 in sitting  Ambulation/Gait                Stairs            Wheelchair Mobility    Modified Rankin (Stroke Patients Only)       Balance Overall balance assessment: Needs assistance Sitting-balance support: Bilateral upper extremity supported Sitting balance-Leahy Scale: Poor Sitting balance -  Comments: leaning back on hands, close S for sitting during BP measurement Postural control: Posterior lean                                   Pertinent Vitals/Pain Pain Assessment: Faces Faces Pain Scale: Hurts whole lot Pain Location: L hip pain with mobility Pain Descriptors / Indicators: Grimacing;Operative site guarding Pain Intervention(s): Monitored during session;Repositioned;Patient requesting pain meds-RN notified    Home Living Family/patient expects to be discharged to:: Skilled nursing facility Living Arrangements: Alone Available Help at Discharge: Family;Available PRN/intermittently Type of Home: House Home Access: Stairs to enter Entrance Stairs-Rails: None Entrance Stairs-Number of Steps: 1 Home Layout: One level Home Equipment: Grab bars - toilet;None      Prior Function Level of Independence: Independent         Comments: fell in December going into garage, had knee injury (fx)     Hand Dominance   Dominant Hand: Right    Extremity/Trunk Assessment   Upper Extremity Assessment Upper Extremity Assessment: Defer to OT evaluation    Lower Extremity Assessment Lower Extremity Assessment: RLE deficits/detail;LLE deficits/detail RLE Deficits / Details: AROM WFL, strength grossly 4-/5 LLE Deficits / Details: AAROM limited by pain to about 60 knee flexion and 70 hip flexion in sitting    Cervical / Trunk Assessment Cervical / Trunk Assessment: Kyphotic  Communication  Communication: HOH  Cognition Arousal/Alertness: Awake/alert Behavior During Therapy: WFL for tasks assessed/performed Overall Cognitive Status: Impaired/Different from baseline Area of Impairment: Problem solving;Following commands;Orientation                 Orientation Level: Disoriented to;Place;Time (knew it was Sept, but unable to state year)     Following Commands: Follows one step commands inconsistently;Follows one step commands with increased time      Problem Solving: Slow processing;Decreased initiation;Requires verbal cues;Requires tactile cues        General Comments General comments (skin integrity, edema, etc.): Daughter in room, lives in [place], gave all demographic information    Exercises Total Joint Exercises Ankle Circles/Pumps: AROM;Both;10 reps;Supine Quad Sets: AROM;Right;Left;5 reps;Supine (unable to perform correctly on L even after cues, and mirroring on R) Heel Slides: AAROM;Both;AROM;10 reps;Supine   Assessment/Plan    PT Assessment Patient needs continued PT services  PT Problem List Decreased strength;Decreased mobility;Decreased safety awareness;Decreased activity tolerance;Decreased balance;Decreased knowledge of use of DME;Pain       PT Treatment Interventions DME instruction;Gait training;Therapeutic exercise;Patient/family education;Therapeutic activities;Balance training;Functional mobility training    PT Goals (Current goals can be found in the Care Plan section)  Acute Rehab PT Goals Patient Stated Goal: To go to rehab (prefers in [place]) PT Goal Formulation: With patient/family Time For Goal Achievement: 12/22/16 Potential to Achieve Goals: Good    Frequency Min 3X/week   Barriers to discharge        Co-evaluation               AM-PAC PT "6 Clicks" Daily Activity  Outcome Measure Difficulty turning over in bed (including adjusting bedclothes, sheets and blankets)?: Unable Difficulty moving from lying on back to sitting on the side of the bed? : Unable Difficulty sitting down on and standing up from a chair with arms (e.g., wheelchair, bedside commode, etc,.)?: Unable Help needed moving to and from a bed to chair (including a wheelchair)?: Total Help needed walking in hospital room?: Total Help needed climbing 3-5 steps with a railing? : Total 6 Click Score: 6    End of Session   Activity Tolerance: Treatment limited secondary to medical complications (Comment)  (orthostatic in sitting) Patient left: in bed;with call bell/phone within reach;with family/visitor present Nurse Communication: Other (comment) (low BP sitting so return to supine) PT Visit Diagnosis: History of falling (Z91.81);Difficulty in walking, not elsewhere classified (R26.2);Pain;Muscle weakness (generalized) (M62.81) Pain - Right/Left: Left Pain - part of body: Hip    Time: 0930-1004 PT Time Calculation (min) (ACUTE ONLY): 34 min   Charges:   PT Evaluation $PT Eval High Complexity: 1 High PT Treatments $Therapeutic Activity: 8-22 mins   PT G CodesMagda Kiel, [place] [phone] 12/08/2016   Reginia Naas 12/08/2016, 10:42 AM

## 2016-12-08 NOTE — Progress Notes (Signed)
Nutrition Follow-up  DOCUMENTATION CODES:   Severe malnutrition in context of chronic illness, Underweight  INTERVENTION:  Provide Ensure Enlive po TID, each supplement provides 350 kcal and 20 grams of protein.  Encourage adequate PO intake.   NUTRITION DIAGNOSIS:   Malnutrition (severe) related to chronic illness (dementia) as evidenced by severe depletion of body fat, severe depletion of muscle mass; ongoing  GOAL:   Patient will meet greater than or equal to 90% of their needs; progressing  MONITOR:   PO intake, Supplement acceptance, Labs, Weight trends, Skin, I & O's  REASON FOR ASSESSMENT:   Consult Hip fracture protocol  ASSESSMENT:   76 year old male with history of prior stroke, mild dementia, seizure disorder, atrial flutter, history of GI bleed presented after a fall sustaining hip pain. Found to have left intertrochanteric hip fracture.  PROCEDURE (9/18):  INTRAMEDULLARY (IM) NAIL FEMORAL  Meal completion has been 0-25%. Pt reports having a poor appetite with poor po intake that has been ongoing, pt reports he tries to consumes 3 meals a day. Unable to obtain diet recall from patient. Pt currently has Ensure ordered and has been consuming them. RD to increase to TID to aid in adequate nutrition. Pt encouraged to eat his food at meals and to drink his supplements.   Nutrition-Focused physical exam completed. Findings are severe fat depletion, severe muscle depletion, and mild edema.   Labs and medications reviewed.   Diet Order:  DIET SOFT Room service appropriate? Yes; Fluid consistency: Thin  Skin:  Reviewed, no issues  Last BM:  Unknown  Height:   Ht Readings from Last 1 Encounters:  12/05/16 5\' 11"  (1.803 m)    Weight:   Wt Readings from Last 1 Encounters:  12/05/16 130 lb (59 kg)    Ideal Body Weight:  78.18 kg  BMI:  Body mass index is 18.13 kg/m.  Estimated Nutritional Needs:   Kcal:  1800-2000  Protein:  80-90 grams  Fluid:  1.8  - 2 L/day  EDUCATION NEEDS:   No education needs identified at this time  Corrin Parker, MS, RD, LDN Pager # [phone] After hours/ weekend pager # [phone]

## 2016-12-08 NOTE — Social Work (Signed)
CSW called [facility] and faxed clinicals to [facility] in admissions.  CSW called [facility] and faxed clinicals to [place] in admissions.  CSW called [facility] and faxed clinicals to [facility] in admissions.  CSW will continue to follow up.  Elissa Hefty, LCSW Clinical Social Worker [phone]

## 2016-12-08 NOTE — Clinical Social Work Note (Signed)
Clinical Social Work Assessment  Patient Details  Name: [name] MRN: [id number] Date of Birth: 1940/08/04  Date of referral:  12/08/16               Reason for consult:  Facility Placement                Permission sought to share information with:  Chartered certified accountant granted to share information::  Yes, Verbal Permission Granted  Name::     Museum/gallery curator::  SNF  Relationship::  daughter  Contact Information:     Housing/Transportation Living arrangements for the past 2 months:  Single Family Home Source of Information:  Patient, Adult Children Patient Interpreter Needed:  None Criminal Activity/Legal Involvement Pertinent to Current Situation/Hospitalization:  No - Comment as needed Significant Relationships:  Adult Children, Siblings Lives with:  Self Do you feel safe going back to the place where you live?  No Need for family participation in patient care:  Yes (Comment)  Care giving concerns:  Pt resided at home alone. Per daughter at bedside, patient has an aid daily that provides meals and helps at home. Pt not safe to return home at this time. Daughter indicated that she might want patient to get short term rehab by her in [place].  Social Worker assessment / plan:  CSW met with patient and daughter at bedside to discuss recommendations for SNF placement. Family want patient in the [place] area for SNF. Pt has been to SNF in the past near his daughter in [place]. Daughter indicated she will provide CSW a list of SNF's near her home to see if they can offer a bed.  Employment status:  Retired Forensic scientist:  Medicare PT Recommendations:  [place] / Referral to community resources:  [place]  Patient/Family's Response to care:  Psychologist, prison and probation services of CSW assistance with SNF placement. No issues or concerns identified.  Patient/Family's Understanding of and Emotional Response  to Diagnosis, Current Treatment, and Prognosis:  Patient has good understanding of diagnosis, current treatment, prognosis and is hopeful he will improve with short term rehab. Pt willing to go to [place] where he has support near his daughter. CSW will assist with transition. No issues or concerns identified.  Emotional Assessment Appearance:  Appears stated age Attitude/Demeanor/Rapport:   (Cooperative) Affect (typically observed):  Accepting, Appropriate Orientation:  Oriented to Self, Oriented to Place Alcohol / Substance use:  Not Applicable Psych involvement (Current and /or in the community):  No (Comment)  Discharge Needs  Concerns to be addressed:  Care Coordination Readmission within the last 30 days:  No Current discharge risk:  Dependent with Mobility, Physical Impairment Barriers to Discharge:  No Barriers Identified   Normajean Baxter, LCSW 12/08/2016, 10:43 AM

## 2016-12-08 NOTE — Progress Notes (Addendum)
PROGRESS NOTE    [name]   GEZ:[id number]  DOB: 1940-07-04  DOA: 12/05/2016 PCP: [name], MD   Brief Narrative:  [name] 76 year old male with history of prior stroke, mild dementia, seizure disorder, atrial flutter, history of GI bleed presented after trying to swat at a bug and losing his balance. Xray in ER showed left intertrochanteric fracture.     Subjective: Minimally verbal. Only tells me his name.    Assessment & Plan:   Principal Problem:   Closed fracture of femur, intertrochanteric, left, initial encounter  -  intramedullary nail fixation - follow INR - on Coumadin for A-fib  - SNF per PT - recommend osteoporosis screening   Active Problems:      Atrial flutter  - cont coumadin - not on rate controlling agents  Epilepsy - Keppra and Dilantin - Dilantin level supra therapeutic (corrected is 34.4) but daughter states that his neurologist prefers it this way- I have messaged his Neurologist to make her aware of this and am awaiting a response- I will hold Dilantin for now    Acute blood loss anemia Iron deficiency anemia due to chronic blood loss  - Hb 8.9 on admission- has been transfused 3 Units so far but Hb 7.4 today (has been repeated) - will give another unit of blood now  Left hand leaking IV - RN has removed it- has no IV- IV team has been called   - Sleepy- cont meds via IV for now   DVT prophylaxis: Coumadin Code Status: Full code Family Communication:  Disposition Plan: home Consultants:   ortho Procedures:    left intramedullary nail fixation Antimicrobials:  Anti-infectives    Start     Dose/Rate Route Frequency Ordered Stop   12/07/16 1830  ceFAZolin (ANCEF) IVPB 1 g/50 mL premix     1 g 100 mL/hr over 30 Minutes Intravenous Every 6 hours 12/07/16 1633 12/08/16 0622       Objective: Vitals:   12/07/16 2018 12/08/16 0037 12/08/16 0419 12/08/16 1020  BP: 118/66 (!) 105/50 (!) 100/55 (!) 108/52   Pulse: (!) 119 99 100 96  Resp: 17 15 15    Temp: 99 F (37.2 C) 100.3 F (37.9 C) 98.9 F (37.2 C)   TempSrc: Oral Axillary Axillary   SpO2: 93% 100% 95%   Weight:      Height:        Intake/Output Summary (Last 24 hours) at 12/08/16 1433 Last data filed at 12/08/16 1200  Gross per 24 hour  Intake          1256.67 ml  Output              850 ml  Net           406.67 ml   Filed Weights   12/05/16 2317  Weight: 59 kg (130 lb)    Examination: General exam: Appears comfortable - keeps eyes mostly closed and does not follow commands HEENT: barely opens eyes, oral mucosa moist, no sclera icterus or thrush Respiratory system: Clear to auscultation. Respiratory effort normal. Cardiovascular system: S1 & S2 heard, RRR.  No murmurs  Gastrointestinal system: Abdomen soft, non-tender, nondistended. Normal bowel sound. No organomegaly Central nervous system: sleepy. Tells me his name, No focal neurological deficits. Extremities: No cyanosis, clubbing  - left hand edema around IV site Skin: No rashes or ulcers Psychiatry:  Mood & affect appropriate.     Data Reviewed: I have personally reviewed following labs and  imaging studies  CBC:  Recent Labs Lab 12/05/16 2355 12/07/16 0319 12/08/16 0554 12/08/16 0810  WBC 5.3 6.4 9.0 8.4  NEUTROABS 3.9  --   --   --   HGB 8.9* 7.2* 7.3* 7.4*  HCT 27.7* 22.7* 22.1* 22.1*  MCV 91.1 91.2 89.5 90.2  PLT 257 165 126* 161*   Basic Metabolic Panel:  Recent Labs Lab 12/05/16 2355 12/07/16 0319 12/08/16 0554  NA 136 135 136  K 4.2 3.6 3.6  CL 100* 103 106  CO2 29 26 20*  GLUCOSE 138* 102* 164*  BUN 13 11 11   CREATININE 0.74 0.71 0.80  CALCIUM 8.3* 7.6* 7.4*   GFR: Estimated Creatinine Clearance: 64.5 mL/min (by C-G formula based on SCr of 0.8 mg/dL). Liver Function Tests:  Recent Labs Lab 12/08/16 0554  AST 27  ALT 12*  ALKPHOS 49  BILITOT 0.7  PROT 4.9*  ALBUMIN 2.7*   No results for input(s): LIPASE, AMYLASE in  the last 168 hours. No results for input(s): AMMONIA in the last 168 hours. Coagulation Profile:  Recent Labs Lab 12/05/16 2355 12/07/16 0319 12/08/16 0554  INR 1.85 2.61 2.05   Cardiac Enzymes: No results for input(s): CKTOTAL, CKMB, CKMBINDEX, TROPONINI in the last 168 hours. BNP (last 3 results) No results for input(s): PROBNP in the last 8760 hours. HbA1C: No results for input(s): HGBA1C in the last 72 hours. CBG:  Recent Labs Lab 12/06/16 1146  GLUCAP 107*   Lipid Profile: No results for input(s): CHOL, HDL, LDLCALC, TRIG, CHOLHDL, LDLDIRECT in the last 72 hours. Thyroid Function Tests: No results for input(s): TSH, T4TOTAL, FREET4, T3FREE, THYROIDAB in the last 72 hours. Anemia Panel: No results for input(s): VITAMINB12, FOLATE, FERRITIN, TIBC, IRON, RETICCTPCT in the last 72 hours. Urine analysis:    Component Value Date/Time   COLORURINE YELLOW 12/01/2015 1300   APPEARANCEUR CLEAR 12/01/2015 1300   LABSPEC 1.017 12/01/2015 1300   PHURINE 5.5 12/01/2015 1300   GLUCOSEU NEGATIVE 12/01/2015 1300   HGBUR NEGATIVE 12/01/2015 1300   BILIRUBINUR NEGATIVE 12/01/2015 1300   KETONESUR NEGATIVE 12/01/2015 1300   PROTEINUR NEGATIVE 12/01/2015 1300   UROBILINOGEN 0.2 06/02/2011 0300   NITRITE NEGATIVE 12/01/2015 1300   LEUKOCYTESUR NEGATIVE 12/01/2015 1300   Sepsis Labs: @LABRCNTIP (procalcitonin:4,lacticidven:4) ) Recent Results (from the past 240 hour(s))  Surgical pcr screen     Status: None   Collection Time: 12/07/16  3:04 AM  Result Value Ref Range Status   MRSA, PCR NEGATIVE NEGATIVE Final   Staphylococcus aureus NEGATIVE NEGATIVE Final    Comment: (NOTE) The Xpert SA Assay (FDA approved for NASAL specimens in patients 21 years of age and older), is one component of a comprehensive surveillance program. It is not intended to diagnose infection nor to guide or monitor treatment.          Radiology Studies: Dg C-arm 1-60 Min  Result Date:  12/07/2016 CLINICAL DATA:  Intramedullary rod fixation. EXAM: DG C-ARM 61-120 MIN; LEFT FEMUR 2 VIEWS FLUOROSCOPY TIME:  1 minutes 20 seconds. COMPARISON:  Radiographs of December 06, 2016. FINDINGS: Four intraoperative fluoroscopic images were obtained of the left femur. These demonstrate the patient be status post surgical internal fixation of intertrochanteric fracture proximal left femur with intramedullary rod fixation. IMPRESSION: Status post surgical internal fixation of proximal left femur intertrochanteric fracture. Electronically Signed   By: Marijo Conception, [name]   On: 12/07/2016 16:05   Dg Hip Port Unilat With Pelvis 1v Left  Result Date: 12/07/2016 CLINICAL DATA:  Left hip intertrochanteric fracture, status post operative fixation EXAM: DG HIP (WITH OR WITHOUT PELVIS) 1V PORT LEFT COMPARISON:  12/06/2016 FINDINGS: Left femur intramedullary rod and screw fixation reduces the acute comminuted left hip intertrochanteric fracture. Improved alignment. Bones are osteopenic. Degenerative changes of both hips with joint space loss, sclerosis and osteophytes. Lumbosacral degenerative change noted. Bony pelvis intact. Expected postoperative changes of the soft tissues. IMPRESSION: Status post ORIF of the left hip intertrochanteric fracture with improved alignment. No complicating feature. Electronically Signed   By: Jerilynn Mages.  Shick [name]   On: 12/07/2016 16:46   Dg Femur Min 2 Views Left  Result Date: 12/07/2016 CLINICAL DATA:  Intramedullary rod fixation. EXAM: DG C-ARM 61-120 MIN; LEFT FEMUR 2 VIEWS FLUOROSCOPY TIME:  1 minutes 20 seconds. COMPARISON:  Radiographs of December 06, 2016. FINDINGS: Four intraoperative fluoroscopic images were obtained of the left femur. These demonstrate the patient be status post surgical internal fixation of intertrochanteric fracture proximal left femur with intramedullary rod fixation. IMPRESSION: Status post surgical internal fixation of proximal left femur  intertrochanteric fracture. Electronically Signed   By: Marijo Conception, [name]   On: 12/07/2016 16:05      Scheduled Meds: . docusate sodium  100 mg Oral BID  . feeding supplement (ENSURE ENLIVE)  237 mL Oral BID BM  . ferrous sulfate  325 mg Oral TID PC  . phenytoin (DILANTIN) IV  100 mg Intravenous Q8H  . predniSONE  10 mg Oral Q48H  . [START ON 12/09/2016] predniSONE  30 mg Oral Q48H  . warfarin  4 mg Oral ONCE-1800  . Warfarin - Pharmacist Dosing Inpatient   Does not apply q1800   Continuous Infusions: . sodium chloride    . dextrose 5 % and 0.9 % NaCl with KCl 20 mEq/L 100 mL/hr at 12/07/16 2108  . famotidine (PEPCID) IV Stopped (12/08/16 1431)  . levETIRAcetam Stopped (12/08/16 1120)     LOS: 2 days    Time spent in minutes: 35    Debbe Odea, MD [facility] Pager: [email or web address] Password [facility] 12/08/2016, 2:33 PM

## 2016-12-08 NOTE — Evaluation (Signed)
Occupational Therapy Evaluation Patient Details Name: [name] MRN: [id number] DOB: [birth year] Today's Date: 12/08/2016    History of Present Illness [name] is a 76 y.o. male with a past medical history significant for hx of CVA with residual seizures on Phenytoin/Keppra, Aflutter, dementia and hx of GIB who presents with hip pain after a fall.  Positive for left intertrochanteric hip fracture, now s/p IM nail.    Clinical Impression   This 76 y/o M presents with the above. Pt lives alone, and at baseline is independent with ADLs and functional mobility. Pt presents with general weakness, decreased activity tolerance, pain, decreased functional mobility and ADL status. Pt currently requires MaxA for bed mobility and attempts for sit<>stand at RW with further mobility deferred as anticipate Pt will require +2 assist. Pt requires MaxA for LB ADLs. Pt will benefit from continued OT services in acute setting and recommend additional services in SNF setting prior to return home to maximize Pt's safety and independence with ADLs and functional mobility.     Follow Up Recommendations  SNF;Supervision/Assistance - 24 hour    Equipment Recommendations  Other (comment) (to be further assessed )           Precautions / Restrictions Precautions Precautions: Fall Precaution Comments: orthostatic on eval Restrictions Weight Bearing Restrictions: Yes LLE Weight Bearing: Weight bearing as tolerated      Mobility Bed Mobility Overal bed mobility: Needs Assistance Bed Mobility: Supine to Sit;Sit to Supine     Supine to sit: Max assist;HOB elevated Sit to supine: Max assist   General bed mobility comments: assist for LE management; verbal cues for sequencing/technique, Pt able to pull trunk into upright position with therapist assisting to steady initially assist for scooting to EOB; Pt requires MaxA for LE management when returning to supine, verbal cues for  technique  Transfers Overall transfer level: Needs assistance Equipment used: Rolling walker (2 wheeled) Transfers: Sit to/from Stand Sit to Stand: Max assist;From elevated surface         General transfer comment: MaxA for sit<>stand at RW, Pt able to clear hips from EOB with increased time, Pt demonstrates increased difficulty bringing trunk into upright position, further mobility deferred due to safety concerns     Balance Overall balance assessment: Needs assistance Sitting-balance support: Feet supported;Single extremity supported Sitting balance-Leahy Scale: Fair Sitting balance - Comments: Pt able to maintain static sitting with single UE support and with close guard for safety    Standing balance support: Bilateral upper extremity supported Standing balance-Leahy Scale: Zero                             ADL either performed or assessed with clinical judgement   ADL Overall ADL's : Needs assistance/impaired Eating/Feeding: Set up;Sitting   Grooming: Set up;Sitting   Upper Body Bathing: Minimal assistance;Sitting   Lower Body Bathing: Moderate assistance;Sit to/from stand;+2 for physical assistance;+2 for safety/equipment   Upper Body Dressing : Minimal assistance;Sitting   Lower Body Dressing: Maximal assistance;+2 for physical assistance;+2 for safety/equipment;Sit to/from stand       Toileting- Water quality scientist and Hygiene: Total assistance;Bed level       Functional mobility during ADLs: Maximal assistance General ADL Comments: Pt able to transfer supine to EOB, maintaining sitting balance approx 5-7 min during session, attempted sit<>stand at RW with maxA, Pt able to clear hips from EOB though unable to bring trunk upright; Pt with difficulty understanding/following simple  commands and also easily distracted/perseverating on finding remote/phone, requiring increased time to complete tasks.                          Pertinent  Vitals/Pain Pain Assessment: Faces Faces Pain Scale: Hurts whole lot Pain Location: L hip pain with mobility Pain Descriptors / Indicators: Grimacing;Operative site guarding Pain Intervention(s): Limited activity within patient's tolerance;Monitored during session;Repositioned     Hand Dominance Right   Extremity/Trunk Assessment Upper Extremity Assessment Upper Extremity Assessment: Generalized weakness   Lower Extremity Assessment Lower Extremity Assessment: Defer to PT evaluation   Cervical / Trunk Assessment Cervical / Trunk Assessment: Kyphotic   Communication Communication Communication: HOH   Cognition Arousal/Alertness: Awake/alert Behavior During Therapy: WFL for tasks assessed/performed Overall Cognitive Status: No family/caregiver present to determine baseline cognitive functioning Area of Impairment: Problem solving;Following commands                       Following Commands: Follows one step commands inconsistently;Follows one step commands with increased time     Problem Solving: Slow processing;Decreased initiation;Requires verbal cues;Requires tactile cues     General Comments  BP monitored during session: 111/50 upon initial sitting EOB, 125/55 after return to supine end of session                [place] expects to be discharged to:: Unsure Living Arrangements: Alone Available Help at Discharge: Family;Available PRN/intermittently Type of Home: House Home Access: Stairs to enter [employer] of Steps: 1 Entrance Stairs-Rails: None Home Layout: One level     Bathroom Shower/Tub: Occupational psychologist: Standard     Home Equipment: Grab bars - toilet;None          Prior Functioning/Environment Level of Independence: Independent        Comments: fell in December going into garage, had knee injury (fx)        OT Problem List: Decreased strength;Impaired balance (sitting and/or  standing);Decreased cognition;Pain;Decreased range of motion;Decreased activity tolerance;Decreased knowledge of use of DME or AE      OT Treatment/Interventions: Self-care/ADL training;DME and/or AE instruction;Therapeutic activities;Therapeutic exercise;Balance training;Energy conservation;Patient/family education    OT Goals(Current goals can be found in the care plan section) Acute Rehab OT Goals Patient Stated Goal: To go to rehab (prefers in [place]) OT Goal Formulation: With patient Time For Goal Achievement: 12/22/16 Potential to Achieve Goals: Good  OT Frequency: Min 2X/week                             AM-PAC PT "6 Clicks" Daily Activity     Outcome Measure Help from another person eating meals?: None Help from another person taking care of personal grooming?: A Little Help from another person toileting, which includes using toliet, bedpan, or urinal?: Total Help from another person bathing (including washing, rinsing, drying)?: A Lot Help from another person to put on and taking off regular upper body clothing?: A Little Help from another person to put on and taking off regular lower body clothing?: Total 6 Click Score: 14   End of Session Equipment Utilized During Treatment: Gait belt;Rolling walker Nurse Communication: Mobility status  Activity Tolerance: Patient tolerated treatment well Patient left: in bed;with call bell/phone within reach;with bed alarm set  OT Visit Diagnosis: Unsteadiness on feet (R26.81);Muscle weakness (generalized) (M62.81);Pain Pain - Right/Left: Left Pain - part of body:  Hip                Time: 9470-9628 OT Time Calculation (min): 35 min Charges:  OT General Charges $OT Visit: 1 Visit OT Evaluation $OT Eval Moderate Complexity: 1 Mod OT Treatments $Self Care/Home Management : 8-22 mins G-Codes:     Lou Cal, OT Pager [phone] 12/08/2016   Raymondo Band 12/08/2016, 4:49 PM

## 2016-12-08 NOTE — Progress Notes (Addendum)
   Assessment / Plan: 1 Day Post-Op  S/P Procedure(s) (LRB): INTRAMEDULLARY (IM) NAIL FEMORAL (Left) by Dr. Ernesta Amble. Percell Miller on 12/07/16  Principal Problem:   Closed fracture of femur, intertrochanteric, left, initial encounter [facility]) Active Problems:   Essential hypertension   Atrial flutter (HCC)   Localization-related symptomatic epilepsy and epileptic syndromes with complex partial seizures, not intractable, without status epilepticus ([place])   Iron deficiency anemia due to chronic blood loss   Fall  ADDITIONAL DIAGNOSIS:  Acute Blood Loss Anemia, Hgb 8.9 >7.2>7.3.  Left intertrochanteric Femur Fracture S/P IM Nail Stable from an orthopedic perspective s/p IM nail. Mobilize with therapy when able Incentive Spirometry Apply ice prn  Weight Bearing: Weight Bearing as Tolerated (WBAT) LLE Dressings: PRN Mepilex.  VTE prophylaxis: Resume Coumadin. SCDs, ambulation Dispo: Per primary.  [place] likely when stable medically.  Subjective: Denies pain.  Minimally interactive.  Objective:   VITALS:   Vitals:   12/07/16 1637 12/07/16 2018 12/08/16 0037 12/08/16 0419  BP: (!) 128/52 118/66 (!) 105/50 (!) 100/55  Pulse: 96 (!) 119 99 100  Resp: 16 17 15 15   Temp: 99.5 F (37.5 C) 99 F (37.2 C) 100.3 F (37.9 C) 98.9 F (37.2 C)  TempSrc:  Oral Axillary Axillary  SpO2: 98% 93% 100% 95%  Weight:      Height:       CBC Latest Ref Rng & Units 12/08/2016 12/07/2016 12/05/2016  WBC 4.0 - 10.5 K/uL 9.0 6.4 5.3  Hemoglobin 13.0 - 17.0 g/dL 7.3(L) 7.2(L) 8.9(L)  Hematocrit 39.0 - 52.0 % 22.1(L) 22.7(L) 27.7(L)  Platelets 150 - 400 K/uL 126(L) 165 257   BMP Latest Ref Rng & Units 12/08/2016 12/07/2016 12/05/2016  Glucose 65 - 99 mg/dL 164(H) 102(H) 138(H)  BUN 6 - 20 mg/dL 11 11 13   Creatinine 0.61 - 1.24 mg/dL 0.80 0.71 0.74  Sodium 135 - 145 mmol/L 136 135 136  Potassium 3.5 - 5.1 mmol/L 3.6 3.6 4.2  Chloride 101 - 111 mmol/L 106 103 100(L)  CO2 22 - 32 mmol/L  20(L) 26 29  Calcium 8.9 - 10.3 mg/dL 7.4(L) 7.6(L) 8.3(L)   Intake/Output      09/18 0701 - 09/19 0700 09/19 0701 - 09/20 0700   P.O. 120    I.V. (mL/kg) 1186.7 (20.1)    Blood 945    IV Piggyback 330    Total Intake(mL/kg) 2581.7 (43.8)    Urine (mL/kg/hr) 700 (0.5)    Blood 200    Total Output 900     Net +1681.7          Urine Occurrence 1 x    Stool Occurrence 1 x      Physical Exam: General: NAD.  Resting comfortably upright in bed.  Minimally interactive, but does follow some commands. MSK Feet warm.   Dorsiflexion/Plantar flexion intact Reports sensation distally. Incision: dressing C/D/I   Prudencio Burly III, PA-C 12/08/2016, 7:49 AM

## 2016-12-08 NOTE — Anesthesia Postprocedure Evaluation (Signed)
Anesthesia Post Note  Patient: Tariq Pernell Wehrly  Procedure(s) Performed: Procedure(s) (LRB): INTRAMEDULLARY (IM) NAIL FEMORAL (Left)     Patient location during evaluation: PACU Anesthesia Type: General Level of consciousness: awake and alert Pain management: pain level controlled Vital Signs Assessment: post-procedure vital signs reviewed and stable Respiratory status: spontaneous breathing, nonlabored ventilation, respiratory function stable and patient connected to nasal cannula oxygen Cardiovascular status: blood pressure returned to baseline and stable Postop Assessment: no apparent nausea or vomiting Anesthetic complications: no    Last Vitals:  Vitals:   12/08/16 0037 12/08/16 0419  BP: (!) 105/50 (!) 100/55  Pulse: 99 100  Resp: 15 15  Temp: 37.9 C 37.2 C  SpO2: 100% 95%    Last Pain:  Vitals:   12/08/16 0553  TempSrc:   PainSc: [place]

## 2016-12-08 NOTE — Progress Notes (Addendum)
ANTICOAGULATION CONSULT NOTE - Follow Up Consult  Pharmacy Consult:  Coumadin Indication:  History of Aflutter  No Known Allergies  Patient Measurements: Height: 5\' 11"  (180.3 cm) Weight: 130 lb (59 kg) IBW/kg (Calculated) : 75.3  Vital Signs: Temp: 98.9 F (37.2 C) (09/19 0419) Temp Source: Axillary (09/19 0419) BP: 100/55 (09/19 0419) Pulse Rate: 100 (09/19 0419)  Labs:  Recent Labs  12/05/16 2355 12/07/16 0319 12/08/16 0554 12/08/16 0810  HGB 8.9* 7.2* 7.3* 7.4*  HCT 27.7* 22.7* 22.1* 22.1*  PLT 257 165 126* 125*  LABPROT 21.2* 27.8* 22.9*  --   INR 1.85 2.61 2.05  --   CREATININE 0.74 0.71 0.80  --     Estimated Creatinine Clearance: 64.5 mL/min (by C-G formula based on SCr of 0.8 mg/dL).     Assessment: 34 YOM with history of Aflutter and CVA on Coumadin PTA.  Patient admitted with left hip fracture, now s/p IM nail on 12/07/16.  Coumadin resumed post-op and INR currently therapeutic.  Noted that patient has a history of GIB.  Her hemoglobin and platelet count are low, but no overt bleeding reported.  Home Coumadin dose: 5mg  PO daily   Goal of Therapy:  INR 2-3 Monitor platelets by anticoagulation protocol: Yes    Plan:  Coumadin 4mg  PO today Daily PT / INR   Ailen Strauch D. Mina Marble, PharmD, BCPS Pager:  [phone] 12/08/2016, 10:02 AM

## 2016-12-09 ENCOUNTER — Encounter (HOSPITAL_COMMUNITY): Payer: Self-pay | Admitting: Orthopedic Surgery

## 2016-12-09 DIAGNOSIS — E43 Unspecified severe protein-calorie malnutrition: Secondary | ICD-10-CM

## 2016-12-09 LAB — BPAM RBC
BLOOD PRODUCT EXPIRATION DATE: 201809282359
BLOOD PRODUCT EXPIRATION DATE: 201810082359
Blood Product Expiration Date: 201810082359
ISSUE DATE / TIME: 201809181102
ISSUE DATE / TIME: 201809181200
ISSUE DATE / TIME: 201809191459
UNIT TYPE AND RH: 9500
UNIT TYPE AND RH: 9500
Unit Type and Rh: 9500

## 2016-12-09 LAB — BASIC METABOLIC PANEL
ANION GAP: 5 (ref 5–15)
BUN: 13 mg/dL (ref 6–20)
CHLORIDE: 105 mmol/L (ref 101–111)
CO2: 28 mmol/L (ref 22–32)
Calcium: 7.6 mg/dL — ABNORMAL LOW (ref 8.9–10.3)
Creatinine, Ser: 0.72 mg/dL (ref 0.61–1.24)
GFR calc Af Amer: >60 mL/min (ref >60)
GLUCOSE: 120 mg/dL — AB (ref 65–99)
POTASSIUM: 3.5 mmol/L (ref 3.5–5.1)
Sodium: 138 mmol/L (ref 135–145)

## 2016-12-09 LAB — TYPE AND SCREEN
ABO/RH(D): O NEG
Antibody Screen: NEGATIVE
UNIT DIVISION: 0
Unit division: 0
Unit division: 0

## 2016-12-09 LAB — CBC
HEMATOCRIT: 22.1 % — AB (ref 39.0–52.0)
HEMOGLOBIN: 7.4 g/dL — AB (ref 13.0–17.0)
MCH: 29.6 pg (ref 26.0–34.0)
MCHC: 33.5 g/dL (ref 30.0–36.0)
MCV: 88.4 fL (ref 78.0–100.0)
PLATELETS: 129 10*3/uL — AB (ref 150–400)
RBC: 2.5 MIL/uL — ABNORMAL LOW (ref 4.22–5.81)
RDW: 19.7 % — ABNORMAL HIGH (ref 11.5–15.5)
WBC: 7.5 10*3/uL (ref 4.0–10.5)

## 2016-12-09 LAB — PROTIME-INR
INR: 1.84
Prothrombin Time: 21.1 seconds — ABNORMAL HIGH (ref 11.4–15.2)

## 2016-12-09 LAB — HEMOGLOBIN: HEMOGLOBIN: 7.5 g/dL — AB (ref 13.0–17.0)

## 2016-12-09 MED ORDER — LEVETIRACETAM 500 MG PO TABS
1500.0000 mg | ORAL_TABLET | Freq: Two times a day (BID) | ORAL | Status: DC
  Administered 2016-12-09 – 2016-12-15 (×12): 1500 mg via ORAL
  Filled 2016-12-09 (×12): qty 3

## 2016-12-09 MED ORDER — PHENYTOIN SODIUM EXTENDED 100 MG PO CAPS
300.0000 mg | ORAL_CAPSULE | Freq: Every day | ORAL | Status: DC
  Administered 2016-12-09 – 2016-12-15 (×7): 300 mg via ORAL
  Filled 2016-12-09 (×7): qty 3

## 2016-12-09 MED ORDER — LEVETIRACETAM 500 MG PO TABS: 1500.0000 mg | ORAL_TABLET | Freq: Two times a day (BID) | ORAL | Status: DC

## 2016-12-09 MED ORDER — ENOXAPARIN SODIUM 100 MG/ML ~~LOC~~ SOLN
90.0000 mg | SUBCUTANEOUS | Status: DC
  Administered 2016-12-09: 90 mg via SUBCUTANEOUS
  Filled 2016-12-09 (×2): qty 0.9

## 2016-12-09 MED ORDER — WARFARIN SODIUM 5 MG PO TABS
5.0000 mg | ORAL_TABLET | Freq: Once | ORAL | Status: AC
  Administered 2016-12-09: 5 mg via ORAL
  Filled 2016-12-09: qty 1

## 2016-12-09 NOTE — Progress Notes (Signed)
PROGRESS NOTE    [name]   TGG:[id number]  DOB: 11-07-1940  DOA: 12/05/2016 PCP: [name], MD   Brief Narrative:  [name] 76 year old male with history of prior stroke, mild dementia, seizure disorder, atrial flutter, history of GI bleed presented after trying to swat at a bug and losing his balance. Xray in ER showed left intertrochanteric fracture.     Subjective: Non verbal with me today.   Assessment & Plan:   Principal Problem:   Closed fracture of femur, intertrochanteric, left, initial encounter  -  intramedullary nail fixation - DVT prophylaxis- on Coumadin for A-fib - INR 1.8 today and therefore given Lovenox - SNF per PT - recommend osteoporosis screening at discretion of PCP  Active Problems:      Atrial flutter  - cont coumadin- see above regarding Lovenox - not on rate controlling agents  Epilepsy - Keppra and Dilantin - Dilantin level supra therapeutic (corrected is 34.4) but daughter states that his neurologist prefers it this way- I have messaged his Neurologist to make her aware - Dr [name] has responded that we should continue Dilantin at this dose unless he has side effects from it. - change Keppra to IV as he is alert and eating now    Acute blood loss anemia Iron deficiency anemia due to chronic blood loss  - Hb 8.9 on admission- has been transfused 4 Units so far but Hb 7.5 today (has been repeated) - alert and interactive today-  hold further transfusion for now and follow Hb   Takes Prednisone taper for some sore of muscle weakness- per his daughter, his PCP is weaning it- continue at 10 mg daily which is his home dose     DVT prophylaxis: Coumadin Code Status: Full code Family Communication:  Disposition Plan: home Consultants:   ortho Procedures:    left intramedullary nail fixation Antimicrobials:  Anti-infectives    Start     Dose/Rate Route Frequency Ordered Stop   12/07/16 1830  ceFAZolin (ANCEF)  IVPB 1 g/50 mL premix     1 g 100 mL/hr over 30 Minutes Intravenous Every 6 hours 12/07/16 1633 12/08/16 0622       Objective: Vitals:   12/08/16 1541 12/08/16 1800 12/08/16 2146 12/09/16 0606  BP: (!) 115/59 118/60 107/80 (!) 110/48  Pulse: (!) 110 (!) 102 99 94  Resp: 16 16 16 16   Temp: 98.6 F (37 C) 98.2 F (36.8 C) 98.8 F (37.1 C) 99.5 F (37.5 C)  TempSrc: Axillary Axillary Axillary Axillary  SpO2: 98% 100% 100% 99%  Weight:      Height:        Intake/Output Summary (Last 24 hours) at 12/09/16 1439 Last data filed at 12/09/16 1000  Gross per 24 hour  Intake          2346.67 ml  Output              150 ml  Net          2196.67 ml   Filed Weights   12/05/16 2317  Weight: 59 kg (130 lb)    Examination: General exam: Appears comfortable  HEENT: PERRLA, oral mucosa moist, no sclera icterus or thrush Respiratory system: Clear to auscultation. Respiratory effort normal. Cardiovascular system: S1 & S2 heard,  No murmurs  Gastrointestinal system: Abdomen soft, non-tender, nondistended. Normal bowel sound. No organomegaly Central nervous system: Alert and oriented. No focal neurological deficits. Extremities: No cyanosis, clubbing or edema Skin: No rashes or  ulcers Psychiatry:  Mood & affect appropriate.     Data Reviewed: I have personally reviewed following labs and imaging studies  CBC:  Recent Labs Lab 12/05/16 2355 12/07/16 0319 12/08/16 0554 12/08/16 0810 12/08/16 1857 12/09/16 0314 12/09/16 1128  WBC 5.3 6.4 9.0 8.4  --  7.5  --   NEUTROABS 3.9  --   --   --   --   --   --   HGB 8.9* 7.2* 7.3* 7.4* 7.7* 7.4* 7.5*  HCT 27.7* 22.7* 22.1* 22.1* 23.4* 22.1*  --   MCV 91.1 91.2 89.5 90.2  --  88.4  --   PLT 257 165 126* 125*  --  129*  --    Basic Metabolic Panel:  Recent Labs Lab 12/05/16 2355 12/07/16 0319 12/08/16 0554 12/09/16 0314  NA 136 135 136 138  K 4.2 3.6 3.6 3.5  CL 100* 103 106 105  CO2 29 26 20* 28  GLUCOSE 138* 102* 164*  120*  BUN 13 11 11 13   CREATININE 0.74 0.71 0.80 0.72  CALCIUM 8.3* 7.6* 7.4* 7.6*   GFR: Estimated Creatinine Clearance: 64.5 mL/min (by C-G formula based on SCr of 0.72 mg/dL). Liver Function Tests:  Recent Labs Lab 12/08/16 0554  AST 27  ALT 12*  ALKPHOS 49  BILITOT 0.7  PROT 4.9*  ALBUMIN 2.7*   No results for input(s): LIPASE, AMYLASE in the last 168 hours. No results for input(s): AMMONIA in the last 168 hours. Coagulation Profile:  Recent Labs Lab 12/05/16 2355 12/07/16 0319 12/08/16 0554 12/09/16 0314  INR 1.85 2.61 2.05 1.84   Cardiac Enzymes: No results for input(s): CKTOTAL, CKMB, CKMBINDEX, TROPONINI in the last 168 hours. BNP (last 3 results) No results for input(s): PROBNP in the last 8760 hours. HbA1C: No results for input(s): HGBA1C in the last 72 hours. CBG:  Recent Labs Lab 12/06/16 1146  GLUCAP 107*   Lipid Profile: No results for input(s): CHOL, HDL, LDLCALC, TRIG, CHOLHDL, LDLDIRECT in the last 72 hours. Thyroid Function Tests: No results for input(s): TSH, T4TOTAL, FREET4, T3FREE, THYROIDAB in the last 72 hours. Anemia Panel: No results for input(s): VITAMINB12, FOLATE, FERRITIN, TIBC, IRON, RETICCTPCT in the last 72 hours. Urine analysis:    Component Value Date/Time   COLORURINE YELLOW 12/01/2015 1300   APPEARANCEUR CLEAR 12/01/2015 1300   LABSPEC 1.017 12/01/2015 1300   PHURINE 5.5 12/01/2015 1300   GLUCOSEU NEGATIVE 12/01/2015 1300   HGBUR NEGATIVE 12/01/2015 1300   BILIRUBINUR NEGATIVE 12/01/2015 1300   KETONESUR NEGATIVE 12/01/2015 1300   PROTEINUR NEGATIVE 12/01/2015 1300   UROBILINOGEN 0.2 06/02/2011 0300   NITRITE NEGATIVE 12/01/2015 1300   LEUKOCYTESUR NEGATIVE 12/01/2015 1300   Sepsis Labs: @LABRCNTIP (procalcitonin:4,lacticidven:4) ) Recent Results (from the past 240 hour(s))  Surgical pcr screen     Status: None   Collection Time: 12/07/16  3:04 AM  Result Value Ref Range Status   MRSA, PCR NEGATIVE NEGATIVE  Final   Staphylococcus aureus NEGATIVE NEGATIVE Final    Comment: (NOTE) The Xpert SA Assay (FDA approved for NASAL specimens in patients 29 years of age and older), is one component of a comprehensive surveillance program. It is not intended to diagnose infection nor to guide or monitor treatment.          Radiology Studies: Dg Hip Port Unilat With Pelvis 1v Left  Result Date: 12/07/2016 CLINICAL DATA:  Left hip intertrochanteric fracture, status post operative fixation EXAM: DG HIP (WITH OR WITHOUT PELVIS) 1V PORT LEFT COMPARISON:  12/06/2016 FINDINGS: Left femur intramedullary rod and screw fixation reduces the acute comminuted left hip intertrochanteric fracture. Improved alignment. Bones are osteopenic. Degenerative changes of both hips with joint space loss, sclerosis and osteophytes. Lumbosacral degenerative change noted. Bony pelvis intact. Expected postoperative changes of the soft tissues. IMPRESSION: Status post ORIF of the left hip intertrochanteric fracture with improved alignment. No complicating feature. Electronically Signed   By: Jerilynn Mages.  Shick [name]   On: 12/07/2016 16:46      Scheduled Meds: . docusate sodium  100 mg Oral BID  . enoxaparin (LOVENOX) injection  90 mg Subcutaneous Q24H  . feeding supplement (ENSURE ENLIVE)  237 mL Oral TID BM  . ferrous sulfate  325 mg Oral TID PC  . predniSONE  10 mg Oral Q48H  . warfarin  5 mg Oral ONCE-1800  . Warfarin - Pharmacist Dosing Inpatient   Does not apply q1800   Continuous Infusions: . dextrose 5 % and 0.9 % NaCl with KCl 20 mEq/L 100 mL/hr at 12/07/16 2108  . famotidine (PEPCID) IV Stopped (12/09/16 1307)  . levETIRAcetam Stopped (12/09/16 1206)     LOS: 3 days    Time spent in minutes: 35    Debbe Odea, MD [facility] Pager: [email or web address] Password [facility] 12/09/2016, 2:39 PM

## 2016-12-09 NOTE — Social Work (Addendum)
CSW daughter back and discussed with her the [facility] will not have a bed until 9/24.   CSW called [facility] again and the staff advised that admission person out of office today and will return tomorrow. CSW will f/u up again on 9/21. There is no other staff that can assist with admission.  CSW discussed with daughter two other SNF's near [place]: [facility] and [facility]. Daughter will look in to both SNF's and call CSW back.  CSW also advised that we have bed offers in [place] area if we need them. Daughter in agreement.  CSW will continue to follow up.  3:54pm: daughter called back and indicated that she would be interested in [facility]. CSW will f/u with getting clinicals to SNF.  CSW faxed clinicals to Najee([phone]) at [facility].  CSW will continue to review.  4:26pm- daughter provided another SNF-[name], [phone]. CSW left message for call back to discuss. No clinicals sent for review. CSw will need to f/u on 9/21.  Brent Hefty, LCSW Clinical Social Worker [phone]

## 2016-12-09 NOTE — Progress Notes (Signed)
ANTICOAGULATION CONSULT NOTE - Follow Up Consult  Pharmacy Consult:  Coumadin Indication:  History of Aflutter  No Known Allergies  Patient Measurements: Height: 5\' 11"  (180.3 cm) Weight: 130 lb (59 kg) IBW/kg (Calculated) : 75.3  Vital Signs: Temp: 99.5 F (37.5 C) (09/20 0606) Temp Source: Axillary (09/20 0606) BP: 110/48 (09/20 0606) Pulse Rate: 94 (09/20 0606)  Labs:  Recent Labs  12/07/16 0319 12/08/16 0554 12/08/16 0810 12/08/16 1857 12/09/16 0314  HGB 7.2* 7.3* 7.4* 7.7* 7.4*  HCT 22.7* 22.1* 22.1* 23.4* 22.1*  PLT 165 126* 125*  --  129*  LABPROT 27.8* 22.9*  --   --  21.1*  INR 2.61 2.05  --   --  1.84  CREATININE 0.71 0.80  --   --  0.72   Estimated Creatinine Clearance: 64.5 mL/min (by C-G formula based on SCr of 0.72 mg/dL).  Assessment: 54 YOM with history of Aflutter and CVA on Coumadin PTA.  Patient admitted with left hip fracture, now s/p IM nail on 12/07/16.  Coumadin resumed post-op and INR currently therapeutic.  Noted that patient has a history of GIB. HgB has trended down during this admission requiring multiple transfusions. Her hemoglobin 7.4 this morning and platelet count are low, but no overt bleeding reported. INR subtherapeutic 1.84, provider has asked to bridge with once daily Lovenox until INR back to therapeutic.  Home Coumadin dose: 5mg  PO daily   Goal of Therapy:  INR 2-3 Monitor platelets by anticoagulation protocol: Yes    Plan:  Warfarin 5mg  PO today Lovenox 90mg  SQ once daily Daily PT / INR   Georga Bora, PharmD Clinical Pharmacist 12/09/2016 8:26 AM

## 2016-12-09 NOTE — Progress Notes (Signed)
   Assessment / Plan: 2 Days Post-Op  S/P Procedure(s) (LRB): INTRAMEDULLARY (IM) NAIL FEMORAL (Left) by Dr. Ernesta Amble. Percell Miller on 12/07/16  Principal Problem:   Closed fracture of femur, intertrochanteric, left, initial encounter [facility]) Active Problems:   Essential hypertension   Atrial flutter (HCC)   Localization-related symptomatic epilepsy and epileptic syndromes with complex partial seizures, not intractable, without status epilepticus ([place])   Iron deficiency anemia due to chronic blood loss   Fall  ADDITIONAL DIAGNOSIS:  Acute Blood Loss Anemia, Hgb 8.9 >7.2>7.3.>7.4  Left intertrochanteric Femur Fracture S/P IM Nail Much more interactive this morning.   Pain controlled. Stable from an orthopedic perspective s/p IM nail. Mobilize with therapy when able Incentive Spirometry Apply ice prn  Weight Bearing: Weight Bearing as Tolerated (WBAT) LLE Dressings: PRN Mepilex.  VTE prophylaxis: Resume Coumadin. SCDs, ambulation Dispo: Per primary.  [place] when stable medically.  Subjective: Intermittent soreness.   Objective:   VITALS:   Vitals:   12/08/16 1541 12/08/16 1800 12/08/16 2146 12/09/16 0606  BP: (!) 115/59 118/60 107/80 (!) 110/48  Pulse: (!) 110 (!) 102 99 94  Resp: 16 16 16 16   Temp: 98.6 F (37 C) 98.2 F (36.8 C) 98.8 F (37.1 C) 99.5 F (37.5 C)  TempSrc: Axillary Axillary Axillary Axillary  SpO2: 98% 100% 100% 99%  Weight:      Height:       CBC Latest Ref Rng & Units 12/09/2016 12/08/2016 12/08/2016  WBC 4.0 - 10.5 K/uL 7.5 - 8.4  Hemoglobin 13.0 - 17.0 g/dL 7.4(L) 7.7(L) 7.4(L)  Hematocrit 39.0 - 52.0 % 22.1(L) 23.4(L) 22.1(L)  Platelets 150 - 400 K/uL 129(L) - 125(L)   BMP Latest Ref Rng & Units 12/09/2016 12/08/2016 12/07/2016  Glucose 65 - 99 mg/dL 120(H) 164(H) 102(H)  BUN 6 - 20 mg/dL 13 11 11   Creatinine 0.61 - 1.24 mg/dL 0.72 0.80 0.71  Sodium 135 - 145 mmol/L 138 136 135  Potassium 3.5 - 5.1 mmol/L 3.5 3.6 3.6  Chloride  101 - 111 mmol/L 105 106 103  CO2 22 - 32 mmol/L 28 20(L) 26  Calcium 8.9 - 10.3 mg/dL 7.6(L) 7.4(L) 7.6(L)   Intake/Output      09/19 0701 - 09/20 0700   P.O. 480   I.V. (mL/kg) 1146.7 (19.4)   Blood 315   IV Piggyback 165   Total Intake(mL/kg) 2106.7 (35.7)   Urine (mL/kg/hr) 450 (0.3)   Total Output 450   Net +1656.7         Physical Exam: General: NAD.  Resting comfortably upright in bed.   MSK Feet warm.   Dorsiflexion/Plantar flexion intact Reports sensation distally. Incision: dressing C/D/I   Prudencio Burly III, PA-C 12/09/2016, 6:19 AM

## 2016-12-09 NOTE — Social Work (Addendum)
CSW called [facility] and left message for admission staff to call back and discuss.  CSW called [facility] and spoke with Tametria in admission who advised that there are no beds available today and she has some proposed discharges tomorrow, but really cannot offer a bed yet. She indicated she will call CSW back to discuss.  CSW called [facility] and left msg for admissions staff Marguerite to see if she received and reviewed clinicals.    CSW will continue to follow.  11:15am- CSW received a phone call from [place] at [facility] that they will not have any available for the rest of the week.  11:30am- CSW called daughter [name] and discussed SNF updates. CSW also advised her that we will send out to [facility] as back up for placement if we could not get into a SNF near her. Daughter in agreement.  Elissa Hefty, LCSW Clinical Social Worker [phone]

## 2016-12-10 ENCOUNTER — Inpatient Hospital Stay (HOSPITAL_COMMUNITY): Payer: Medicare Other

## 2016-12-10 ENCOUNTER — Encounter (HOSPITAL_COMMUNITY): Payer: Self-pay

## 2016-12-10 DIAGNOSIS — D62 Acute posthemorrhagic anemia: Secondary | ICD-10-CM

## 2016-12-10 DIAGNOSIS — G40909 Epilepsy, unspecified, not intractable, without status epilepticus: Secondary | ICD-10-CM

## 2016-12-10 LAB — CBC WITH DIFFERENTIAL/PLATELET
BASOS ABS: 0 10*3/uL (ref 0.0–0.1)
BASOS PCT: 0 %
EOS ABS: 0 10*3/uL (ref 0.0–0.7)
EOS PCT: 0 %
HCT: 26.6 % — ABNORMAL LOW (ref 39.0–52.0)
HEMOGLOBIN: 9 g/dL — AB (ref 13.0–17.0)
LYMPHS ABS: 1.1 10*3/uL (ref 0.7–4.0)
Lymphocytes Relative: 11 %
MCH: 29.4 pg (ref 26.0–34.0)
MCHC: 33.8 g/dL (ref 30.0–36.0)
MCV: 86.9 fL (ref 78.0–100.0)
Monocytes Absolute: 1.2 10*3/uL — ABNORMAL HIGH (ref 0.1–1.0)
Monocytes Relative: 12 %
NEUTROS PCT: 77 %
Neutro Abs: 7.7 10*3/uL (ref 1.7–7.7)
PLATELETS: 210 10*3/uL (ref 150–400)
RBC: 3.06 MIL/uL — AB (ref 4.22–5.81)
RDW: 17.5 % — ABNORMAL HIGH (ref 11.5–15.5)
WBC: 10 10*3/uL (ref 4.0–10.5)

## 2016-12-10 LAB — CBC
HEMATOCRIT: 18.8 % — AB (ref 39.0–52.0)
Hemoglobin: 6.2 g/dL — CL (ref 13.0–17.0)
MCH: 28.8 pg (ref 26.0–34.0)
MCHC: 33 g/dL (ref 30.0–36.0)
MCV: 87.4 fL (ref 78.0–100.0)
PLATELETS: 204 10*3/uL (ref 150–400)
RBC: 2.15 MIL/uL — AB (ref 4.22–5.81)
RDW: 19 % — AB (ref 11.5–15.5)
WBC: 8.6 10*3/uL (ref 4.0–10.5)

## 2016-12-10 LAB — PROTIME-INR
INR: 1.97
INR: 1.57
PROTHROMBIN TIME: 18.6 s — AB (ref 11.4–15.2)
Prothrombin Time: 22.2 seconds — ABNORMAL HIGH (ref 11.4–15.2)

## 2016-12-10 LAB — APTT: APTT: 27 s (ref 24–36)

## 2016-12-10 LAB — PREPARE RBC (CROSSMATCH)

## 2016-12-10 LAB — GLUCOSE, CAPILLARY: GLUCOSE-CAPILLARY: 145 mg/dL — AB (ref 65–99)

## 2016-12-10 LAB — FIBRINOGEN: Fibrinogen: 515 mg/dL — ABNORMAL HIGH (ref 210–475)

## 2016-12-10 MED ORDER — DEXTROSE 5 % IV SOLN
2.5000 mg | Freq: Once | INTRAVENOUS | Status: AC
  Administered 2016-12-10: 2.5 mg via INTRAVENOUS
  Filled 2016-12-10: qty 0.25

## 2016-12-10 MED ORDER — SODIUM CHLORIDE 0.9 % IV SOLN
Freq: Once | INTRAVENOUS | Status: AC
  Administered 2016-12-10: 09:00:00 via INTRAVENOUS

## 2016-12-10 MED ORDER — WARFARIN SODIUM 5 MG PO TABS
5.0000 mg | ORAL_TABLET | Freq: Once | ORAL | Status: AC
  Administered 2016-12-10: 5 mg via ORAL
  Filled 2016-12-10: qty 1

## 2016-12-10 NOTE — NC FL2 (Signed)
[place] LEVEL OF CARE SCREENING TOOL     IDENTIFICATION  Patient Name: [name] Birthdate: 04-Mar-1941 Sex: male Admission Date (Current Location): 12/05/2016  [facility] and [place] Number:  Herbalist and Address:  [facility], [place] [address], [place], [place] [address]      Provider Number: [id number]  Attending Physician Name and Address:  Debbe Odea, MD  Relative Name and Phone Number:  [name] [phone]    Current Level of Care: Hospital Recommended Level of Care: [place] Prior Approval Number:    Date Approved/Denied: 12/08/16 PASRR Number: [id number]  Discharge Plan: SNF    Current Diagnoses: Patient Active Problem List   Diagnosis Date Noted  . Protein-calorie malnutrition, severe 12/09/2016  . Fall   . Closed fracture of femur, intertrochanteric, left, initial encounter ([place]) 12/06/2016  . Aspiration pneumonia ([place]) 12/01/2015  . Erosive gastropathy: Per EGD 11/30/2015 11/30/2015  . Iron deficiency anemia due to chronic blood loss 11/28/2015  . Localization-related symptomatic epilepsy and epileptic syndromes with complex partial seizures, not intractable, without status epilepticus ([place]) 07/07/2015  . Encounter for therapeutic drug monitoring 08/10/2013  . Dementia (AD) 11/16/2012  . Long term (current) use of anticoagulants 06/30/2011  . Atrial flutter ([place]) 06/04/2011  . 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 SINUSITIS, CHRONIC NEC 12/14/2006  . ABUSE, ALCOHOL, CONTINUOUS 10/11/2006  . CEREBROVASCULAR ACCIDENT, HX OF 10/11/2006  . Essential hypertension 09/15/2006  . CVA 09/15/2006  . ALLERGIC RHINITIS 09/15/2006  . CIRRHOSIS, ALCOHOLIC, LIVER 99/24/2683  . Seizure disorder ([place]) 09/15/2006  . PANCREATITIS, HX OF 09/15/2006    Orientation RESPIRATION BLADDER Height & Weight     Self, Situation, Place  Normal External catheter, Incontinent (placed 12/09/16) Weight:  130 lb (59 kg) Height:  5\' 11"  (180.3 cm)  BEHAVIORAL SYMPTOMS/MOOD NEUROLOGICAL BOWEL NUTRITION STATUS   (Dementia with no behavioral distrubance) Convulsions/Seizures Continent Diet (Mechanical soft)  AMBULATORY STATUS COMMUNICATION OF NEEDS Skin   Extensive Assist Verbally Surgical wounds (Left Hip-12/07/16, Foam dressing)                       Personal Care Assistance Level of Assistance  Bathing, Dressing Bathing Assistance: Maximum assistance   Dressing Assistance: Maximum assistance     Functional Limitations Info             SPECIAL CARE FACTORS FREQUENCY  PT (By licensed PT), OT (By licensed OT)     PT Frequency: 5x/wk OT Frequency: 5x/wk            Contractures      Additional Factors Info  Code Status, Allergies Code Status Info: Full code Allergies Info: No Known Allergies           Current Medications (12/10/2016):  This is the current hospital active medication list Current Facility-Administered Medications  Medication Dose Route Frequency Provider Last Rate Last Dose  . acetaminophen (TYLENOL) tablet 650 mg  650 mg Oral Q6H PRN Rosita Fire, MD   650 mg at 12/07/16 0859  . bisacodyl (DULCOLAX) suppository 10 mg  10 mg Rectal Daily PRN Danford, Suann Larry, MD      . docusate sodium (COLACE) capsule 100 mg  100 mg Oral BID Edwin Dada, MD   100 mg at 12/10/16 1028  . feeding supplement (ENSURE ENLIVE) (ENSURE ENLIVE) liquid 237 mL  237 mL Oral TID BM Rizwan, Saima, MD   237 mL at 12/10/16 1035  .  ferrous sulfate tablet 325 mg  325 mg Oral TID PC Danford, Suann Larry, MD   325 mg at 12/10/16 1325  . HYDROcodone-acetaminophen (NORCO/VICODIN) 5-325 MG per tablet 1 tablet  1 tablet Oral Q6H PRN Debbe Odea, MD   1 tablet at 12/10/16 0630  . HYDROmorphone (DILAUDID) injection 0.5 mg  0.5 mg Intravenous Q4H PRN Debbe Odea, MD   0.5 mg at 12/09/16 2130  . levETIRAcetam (KEPPRA) tablet 1,500 mg  1,500 mg Oral BID Debbe Odea, MD   1,500 mg at 12/10/16 1029  . phenytoin (DILANTIN) ER capsule 300 mg  300 mg Oral Daily Debbe Odea, MD   300 mg at 12/10/16 1029  . polyethylene glycol (MIRALAX / GLYCOLAX) packet 17 g  17 g Oral Daily PRN Danford, Suann Larry, MD      . predniSONE (DELTASONE) tablet 10 mg  10 mg Oral Q48H Rizwan, Saima, MD   10 mg at 12/10/16 1325  . warfarin (COUMADIN) tablet 5 mg  5 mg Oral ONCE-1800 Reginia Naas, RPH      . Warfarin - Pharmacist Dosing Inpatient   Does not apply q1800 Prudencio Burly III, PA-C         Discharge Medications: Please see discharge summary for a list of discharge medications.  Relevant Imaging Results:  Relevant Lab Results:   Additional Information SS#:[id number], [place]

## 2016-12-10 NOTE — Progress Notes (Signed)
ANTICOAGULATION CONSULT NOTE - Follow Up Consult  Pharmacy Consult:  Coumadin Indication:  History of Aflutter  No Known Allergies  Patient Measurements: Height: 5\' 11"  (180.3 cm) Weight: 130 lb (59 kg) IBW/kg (Calculated) : 75.3  Vital Signs: Temp: 99 F (37.2 C) (09/21 0403) Temp Source: Oral (09/21 0403) BP: 128/58 (09/21 0403) Pulse Rate: 117 (09/21 0403)  Labs:  Recent Labs  12/08/16 0554 12/08/16 0810 12/08/16 1857 12/09/16 0314 12/09/16 1128 12/10/16 0348  HGB 7.3* 7.4* 7.7* 7.4* 7.5* 6.2*  HCT 22.1* 22.1* 23.4* 22.1*  --  18.8*  PLT 126* 125*  --  129*  --  204  LABPROT 22.9*  --   --  21.1*  --  22.2*  INR 2.05  --   --  1.84  --  1.97  CREATININE 0.80  --   --  0.72  --   --    Estimated Creatinine Clearance: 64.5 mL/min (by C-G formula based on SCr of 0.72 mg/dL).  Assessment: 20 YOM with history of Aflutter and CVA on Coumadin 5mg  PO daily PTA for Aflutter/CVA. Bridging with Lovenox. S/p IM nail 9/18. INR is mostly stable at 1.97 and borderline therapeutic. Hgb still trending down but no active signs of bleeding.  Goal of Therapy:  INR 2-3 Monitor platelets by anticoagulation protocol: Yes   Plan:  Give warfarin 5mg  PO today Stop Lovenox today Monitor daily INR, CBC, s/s of bleed  Elenor Quinones, PharmD, BCPS Clinical Pharmacist Pager [phone] 12/10/2016 11:06 AM

## 2016-12-10 NOTE — Progress Notes (Signed)
CSW continuing to follow to facilitate discharge planning.  CSW received call from patient's daughter with additional facility information.  CSW called to [facility] [phone]), but could not get anyone to answer the phone.  CSW left voicemail for Clarene Critchley at [facility] at [place] [phone]) but never received a call back.  CSW spoke with Dominica at [facility] [phone]) and faxed over referral for SNF placement; awaiting call back for bed offer.  CSW to continue to follow.  Laveda Abbe,  Clinical Social Worker [phone]

## 2016-12-10 NOTE — Progress Notes (Signed)
Physical Therapy Treatment Patient Details Name: [name] MRN: [id number] DOB: 08-16-1940 Today's Date: 12/10/2016    History of Present Illness Ryann Leavitt is a 76 y.o. male with a past medical history significant for hx of CVA with residual seizures on Phenytoin/Keppra, Aflutter, dementia and hx of GIB who presents with hip pain after a fall.  Positive for left intertrochanteric hip fracture, now s/p IM nail.     PT Comments    Pt performed increased activity and progressed OOB to standing in stedy frame.  Pt post 1 unit of blood and no complaints of dizziness in standing.  Upon standing patient gown appears wet and condom catheter leaking.  pt performed multiple sit to stand and standing trials for peri care.  Pt required use of stedy to stand and due to cognition his ability to follow commands varried.  Plan for SNF remains appropriate.     Follow Up Recommendations  SNF     Equipment Recommendations  Other (comment) (TBA at next venue)    Recommendations for Other Services       Precautions / Restrictions Precautions Precautions: Fall Precaution Comments: orthostatic on eval Restrictions Weight Bearing Restrictions: Yes LLE Weight Bearing: Weight bearing as tolerated    Mobility  Bed Mobility Overal bed mobility: Needs Assistance Bed Mobility: Supine to Sit     Supine to sit: Max assist;HOB elevated     General bed mobility comments: Pt remains to require assistance for LE advancement to edge of bed.  Once LEs to edge of bed patient required assistance for elevation of trunk into sitting.  Pt sitting with posterior pelvic tilt and posterior lean.    Transfers Overall transfer level: Needs assistance Equipment used:  (sara stedy) Transfers: Sit to/from Stand Sit to Stand: Mod assist (Pt required mod assist to ascend in standing from bed.  From higher seated plates of stedy patient required min assistance.  )         General transfer comment:  Mod assist with use of stedy cross bar to pull into standing.  Pt required cues for hand placement on stedy cross bar, cues for hip extension and upper trunk control.  Pt with improved sit to stand trials on sara stedy and increased time standing for pericare.    Ambulation/Gait Ambulation/Gait assistance:  (not performed.  )               Stairs            Wheelchair Mobility    Modified Rankin (Stroke Patients Only)       Balance Overall balance assessment: Needs assistance   Sitting balance-Leahy Scale: Fair       Standing balance-Leahy Scale: Poor                              Cognition Arousal/Alertness: Awake/alert Behavior During Therapy: WFL for tasks assessed/performed Overall Cognitive Status: No family/caregiver present to determine baseline cognitive functioning Area of Impairment: Problem solving;Following commands                 Orientation Level: Disoriented to;Place;Time;Situation     Following Commands: Follows one step commands inconsistently;Follows one step commands with increased time     Problem Solving: Slow processing;Decreased initiation;Requires verbal cues;Requires tactile cues General Comments: Pt peseverates on having business meetings with the NT who is caring for him.        Exercises  General Comments        Pertinent Vitals/Pain Pain Assessment: Faces Faces Pain Scale: Hurts whole lot Pain Location: L hip pain with mobility Pain Descriptors / Indicators: Grimacing;Operative site guarding Pain Intervention(s): Repositioned;Monitored during session;Ice applied    Home Living                      Prior Function            PT Goals (current goals can now be found in the care plan section) Acute Rehab PT Goals Patient Stated Goal: To go to rehab (prefers in [facility]) Potential to Achieve Goals: Good Progress towards PT goals: Progressing toward goals    Frequency    Min  3X/week      PT Plan Current plan remains appropriate    Co-evaluation              AM-PAC PT "6 Clicks" Daily Activity  Outcome Measure  Difficulty turning over in bed (including adjusting bedclothes, sheets and blankets)?: Unable Difficulty moving from lying on back to sitting on the side of the bed? : Unable Difficulty sitting down on and standing up from a chair with arms (e.g., wheelchair, bedside commode, etc,.)?: Unable Help needed moving to and from a bed to chair (including a wheelchair)?: Total Help needed walking in hospital room?: Total Help needed climbing 3-5 steps with a railing? : Total 6 Click Score: 6    End of Session Equipment Utilized During Treatment: Gait belt Activity Tolerance: Treatment limited secondary to agitation (patient has trouble following commands due to dementia.  ) Patient left: in bed;with call bell/phone within reach;with family/visitor present Nurse Communication: Mobility status;Need for lift equipment (sara stedy for transfer back to bed.  ) PT Visit Diagnosis: History of falling (Z91.81);Difficulty in walking, not elsewhere classified (R26.2);Pain;Muscle weakness (generalized) (M62.81) Pain - Right/Left: Left Pain - part of body: Hip     Time: 1417-1450 PT Time Calculation (min) (ACUTE ONLY): 33 min  Charges:  $Therapeutic Activity: 23-37 mins                    G Codes:       Governor Rooks, PTA pager [phone]    Cristela Blue 12/10/2016, 3:09 PM

## 2016-12-10 NOTE — Consult Note (Signed)
New Hematology/Oncology Consult   Referral MD: Dr Debbe Odea    Reason for Referral: anemia refractory to transfusion  HPI:  [name] is a 76 y.o. who was admitted to the hospital on 12/06/16 following a fall and persistent pain in the left hip associated with inability to ambulate. His past medical history is significant for history of stroke, post infarct seizures, atrial flutter, gastrointestinal bleeding. Shunt was on chronic anticoagulation with warfarin prior to admission. During evaluation the emergency room, patient was found to have an intertrochanteric fracture of the left femur. Patient underwent intramedullary nail fixation on 12/07/16 by Dr Renette Butters. INR at the time of surgery was 2.61.Pfrom 8.9g/dL. Patient has received 2 units of packed red blood cells since admission, hemoglobin today has dropped down to 6.2g/dL. Patient did not receive any reversal of anticoagulation other than withholding Coumadin. His most recent INR was 1.97.  Our service was consulted due to persistent and progressive anemia despite blood transfusions. Is in time, patient denies any pain in the right lower extremity. Denies any pain in the distal left extremity. He needs to have some discomfort in the anterolateral portion of the left lower extremity. Denies any numbness or tingling in the distal extremity on the left. No abdominal pain or back pain at this time.    Past Medical History:  Diagnosis Date  . Hiatal hernia   . HTN (hypertension)    pt denies h/o HTN though it appears he was started on spironolactone during his hospitalization 3/13  . Pancreatitis   . Seizures ([place])   . Stroke ([place])   . Typical atrial flutter ([place]) 3/13  :  Past Surgical History:  Procedure Laterality Date  . ESOPHAGOGASTRODUODENOSCOPY N/A 11/30/2015   Procedure: ESOPHAGOGASTRODUODENOSCOPY (EGD);  Surgeon: Wonda Horner, MD;  Location: [facility] ENDOSCOPY;  Service: Endoscopy;  Laterality: N/A;  . FEMUR IM  NAIL Left 12/08/2016  . FEMUR IM NAIL Left 12/07/2016   Procedure: INTRAMEDULLARY (IM) NAIL FEMORAL;  Surgeon: Renette Butters, MD;  Location: [place];  Service: Orthopedics;  Laterality: Left;  . no surgical history  06/2011  :   Current Facility-Administered Medications:  .  acetaminophen (TYLENOL) tablet 650 mg, 650 mg, Oral, Q6H PRN, Rosita Fire, MD, 650 mg at 12/10/16 1744 .  bisacodyl (DULCOLAX) suppository 10 mg, 10 mg, Rectal, Daily PRN, Danford, Suann Larry, MD .  docusate sodium (COLACE) capsule 100 mg, 100 mg, Oral, BID, Danford, Suann Larry, MD, 100 mg at 12/10/16 1028 .  feeding supplement (ENSURE ENLIVE) (ENSURE ENLIVE) liquid 237 mL, 237 mL, Oral, TID BM, Rizwan, Saima, MD, 237 mL at 12/10/16 1035 .  ferrous sulfate tablet 325 mg, 325 mg, Oral, TID PC, Danford, Suann Larry, MD, 325 mg at 12/10/16 1743 .  HYDROcodone-acetaminophen (NORCO/VICODIN) 5-325 MG per tablet 1 tablet, 1 tablet, Oral, Q6H PRN, Debbe Odea, MD, 1 tablet at 12/10/16 0630 .  HYDROmorphone (DILAUDID) injection 0.5 mg, 0.5 mg, Intravenous, Q4H PRN, Debbe Odea, MD, 0.5 mg at 12/09/16 2130 .  levETIRAcetam (KEPPRA) tablet 1,500 mg, 1,500 mg, Oral, BID, Rizwan, Saima, MD, 1,500 mg at 12/10/16 1029 .  phenytoin (DILANTIN) ER capsule 300 mg, 300 mg, Oral, Daily, Rizwan, Saima, MD, 300 mg at 12/10/16 1029 .  polyethylene glycol (MIRALAX / GLYCOLAX) packet 17 g, 17 g, Oral, Daily PRN, Danford, Suann Larry, MD .  predniSONE (DELTASONE) tablet 10 mg, 10 mg, Oral, Q48H, Rizwan, Saima, MD, 10 mg at 12/10/16 1325 .  Warfarin - Pharmacist  Dosing Inpatient, , Does not apply, q1800, Prudencio Burly III, PA-C:  . docusate sodium  100 mg Oral BID  . feeding supplement (ENSURE ENLIVE)  237 mL Oral TID BM  . ferrous sulfate  325 mg Oral TID PC  . levETIRAcetam  1,500 mg Oral BID  . phenytoin  300 mg Oral Daily  . predniSONE  10 mg Oral Q48H  . Warfarin - Pharmacist Dosing Inpatient   Does not  apply q1800  :  No Known Allergies:   Family History  Problem Relation Age of Onset  . Hypertension Unknown      Social History   Social History  . Marital status: Divorced    Spouse name: N/A  . Number of children: N/A  . Years of education: N/A   Occupational History  . Not on file.   Social History Main Topics  . Smoking status: Former Smoker    Quit date: 07/15/1966  . Smokeless tobacco: Never Used  . Alcohol use No     Comment: Quit greater than 5 years ago  . Drug use: No  . Sexual activity: Not on file   Other Topics Concern  . Not on file   Social History Narrative   Lives alone.  Retired from [employer]    Review of Systems: As per history of present illness. All other systems are negative.  Physical Exam:  Blood pressure (!) 117/55, pulse (!) 105, temperature 99.8 F (37.7 C), temperature source Oral, resp. rate 18, height 5\' 11"  (1.803 m), weight 130 lb (59 kg), SpO2 100 %.  Alert, awake, oriented 3. No acute distress. HEENT: eOMI, PERRLA, moist mucous membranes Lungs:clear to auscultation bilaterally without crackles or wheezing Cardiac: S1/S2, regular, no murmurs, rubs, gallops Abdomen: soft, nontender nondistended, bowel sounds are positive. GU: possible peri-neal and penile ecchymosis. No significant swelling Vascular: palpable distal pulses in the left lower extremity. Distal left lower extremities warm and well perfused. Lymph nodes: no pathologic lymphdenopathy. Neurologic: sensation appears to be intact in the left lower extremity Skin: mild induration in the anterolateralleft proximal thigh. No visible ecchymosis or petechiae.   LABS:   Recent Labs  12/09/16 0314 12/09/16 1128 12/10/16 0348  WBC 7.5  --  8.6  HGB 7.4* 7.5* 6.2*  HCT 22.1*  --  18.8*  PLT 129*  --  204     Recent Labs  12/08/16 0554 12/09/16 0314  NA 136 138  K 3.6 3.5  CL 106 105  CO2 20* 28  GLUCOSE 164* 120*  BUN 11 13  CREATININE 0.80 0.72   CALCIUM 7.4* 7.6*      RADIOLOGY:  Ct Abdomen Pelvis Wo Contrast  Result Date: 12/10/2016 CLINICAL DATA:  Unexplained anemia. Clinical concern for retroperitoneal hemorrhage. EXAM: CT ABDOMEN AND PELVIS WITHOUT CONTRAST TECHNIQUE: Multidetector CT imaging of the abdomen and pelvis was performed following the standard protocol without IV contrast. COMPARISON:  10/17/2015. FINDINGS: Lower chest: Mild left lower lobe atelectasis. Minimal pericardial fluid measuring 5 mm in maximum thickness, without significant change. Hepatobiliary: Multiple calcified granulomata throughout the liver again demonstrated. Contracted gallbladder. Pancreas: Diffuse pancreatic atrophy. Spleen: Multiple calcified granulomata Adrenals/Urinary Tract: 3 mm lower pole calculus. Normal appearing right kidney, ureters and urinary bladder. No hydronephrosis. Normal appearing adrenal glands. Stomach/Bowel: Prominent stool and gas in the colon. Fluid in the rectum. Unremarkable stomach and small bowel. No evidence of appendicitis. Vascular/Lymphatic: Mild atheromatous arterial calcifications without aneurysm. No enlarged lymph nodes. Reproductive: Prostate is unremarkable. Other: Left  iliacus and iliopsoas hematoma with moderate enlargement of those muscles. Diffuse subcutaneous edema. Small amount of free peritoneal fluid in the presacral region. Small bilateral inguinal hernias containing fat. Musculoskeletal: Hardware fixation of a comminuted proximal left femur fracture. There is some hemorrhage in the surrounding soft tissues. Extensive lumbar and lower thoracic spine degenerative changes, including changes of DISH. IMPRESSION: 1. Left iliacus and iliopsoas hematoma. 2. Hardware fixation of a comminuted proximal left femur fracture with mild surrounding hemorrhage and edema. 3. Prominent stool and gas in the colon and fluid in the rectum. 4. Diffuse subcutaneous edema. 5. Minimal ascites. 6. Stable minimal pericardial effusion. 7.  Stable small, nonobstructing lower pole left renal calculus. Electronically Signed   By: Claudie Revering [name]   On: 12/10/2016 17:46   Dg Chest 1 View  Result Date: 12/06/2016 CLINICAL DATA:  Pain after a fall.  Left hip fracture. EXAM: CHEST 1 VIEW COMPARISON:  12/08/2015 FINDINGS: Normal heart size and pulmonary vascularity. Diffuse emphysematous changes in the lungs. No airspace disease or consolidation. No blunting of costophrenic angles. No pneumothorax. Mediastinal contours appear intact. Degenerative changes in the shoulders. IMPRESSION: Emphysematous changes in the lungs. No evidence of active pulmonary disease. Electronically Signed   By: Lucienne Capers [name]   On: 12/06/2016 01:23   Ct Head Wo Contrast  Result Date: 12/06/2016 CLINICAL DATA:  76 year old hypertensive male with sudden onset of man status changes. Initial encounter. EXAM: CT HEAD WITHOUT CONTRAST TECHNIQUE: Contiguous axial images were obtained from the base of the skull through the vertex without intravenous contrast. COMPARISON:  09/19/2012 CT. FINDINGS: Brain: No intracranial hemorrhage or CT evidence of large acute infarct. Remote left temporal lobe infarct with encephalomalacia. Chronic microvascular changes. Global atrophy without hydrocephalus. Right frontal region 1.2 x 1.2 x 0.4 cm extra-axial mass with partial calcifications suggestive of small meningioma without surrounding vasogenic edema or mass effect. Vascular: Vascular calcifications Skull: No acute abnormality Sinuses/Orbits: No acute orbital abnormality. Mucosal thickening maxillary and ethmoid sinus air cells. Other: Mastoid air cells and middle ear cavities are clear. IMPRESSION: No intracranial hemorrhage or CT evidence of large acute infarct. Remote left temporal lobe infarct. Chronic microvascular changes.  Global atrophy. Right frontal region 1.2 x 1.2 x 0.4 cm extra-axial mass with partial calcifications suggestive of small meningioma without surrounding  vasogenic edema or mass effect. This is not well delineated on prior exams. Mucosal thickening maxillary and ethmoid sinus air cells. Electronically Signed   By: Genia Del [name]   On: 12/06/2016 12:33   Dg C-arm 1-60 Min  Result Date: 12/07/2016 CLINICAL DATA:  Intramedullary rod fixation. EXAM: DG C-ARM 61-120 MIN; LEFT FEMUR 2 VIEWS FLUOROSCOPY TIME:  1 minutes 20 seconds. COMPARISON:  Radiographs of December 06, 2016. FINDINGS: Four intraoperative fluoroscopic images were obtained of the left femur. These demonstrate the patient be status post surgical internal fixation of intertrochanteric fracture proximal left femur with intramedullary rod fixation. IMPRESSION: Status post surgical internal fixation of proximal left femur intertrochanteric fracture. Electronically Signed   By: Marijo Conception, [name]   On: 12/07/2016 16:05   Dg Hip Port Unilat With Pelvis 1v Left  Result Date: 12/07/2016 CLINICAL DATA:  Left hip intertrochanteric fracture, status post operative fixation EXAM: DG HIP (WITH OR WITHOUT PELVIS) 1V PORT LEFT COMPARISON:  12/06/2016 FINDINGS: Left femur intramedullary rod and screw fixation reduces the acute comminuted left hip intertrochanteric fracture. Improved alignment. Bones are osteopenic. Degenerative changes of both hips with joint space loss, sclerosis and osteophytes. Lumbosacral  degenerative change noted. Bony pelvis intact. Expected postoperative changes of the soft tissues. IMPRESSION: Status post ORIF of the left hip intertrochanteric fracture with improved alignment. No complicating feature. Electronically Signed   By: Jerilynn Mages.  Shick [name]   On: 12/07/2016 16:46   Dg Hip Unilat With Pelvis 2-3 Views Left  Result Date: 12/06/2016 CLINICAL DATA:  Pain after a fall. EXAM: DG HIP (WITH OR WITHOUT PELVIS) 2-3V LEFT COMPARISON:  None. FINDINGS: Diffuse bone demineralization. There is an acute comminuted fracture of the inter trochanteric left hip with focal extension to the sub  trochanteric region. There is associated varus angulation of the left proximal femur. Displaced lesser trochanteric fragment. Degenerative changes in both hips and in the lower lumbar spine. Pelvis appears intact. SI joints and symphysis pubis are not displaced. Calcified phleboliths in the pelvis. IMPRESSION: Acute comminuted fracture of the inter trochanteric left hip with varus angulation. Electronically Signed   By: Lucienne Capers [name]   On: 12/06/2016 01:22   Dg Femur Min 2 Views Left  Result Date: 12/07/2016 CLINICAL DATA:  Intramedullary rod fixation. EXAM: DG C-ARM 61-120 MIN; LEFT FEMUR 2 VIEWS FLUOROSCOPY TIME:  1 minutes 20 seconds. COMPARISON:  Radiographs of December 06, 2016. FINDINGS: Four intraoperative fluoroscopic images were obtained of the left femur. These demonstrate the patient be status post surgical internal fixation of intertrochanteric fracture proximal left femur with intramedullary rod fixation. IMPRESSION: Status post surgical internal fixation of proximal left femur intertrochanteric fracture. Electronically Signed   By: Marijo Conception, [name]   On: 12/07/2016 16:05    Assessment and Plan:  76 year old male who sustained a comminuted left intertrochanteric fracture of the left femur due to mechanical fall while receiving therapeutic anticoagulation. He has developed acute hemoglobin drop subsequent to the trauma, and was taken to the operating room for fixation of the fracture. It appears that patient has persistent anemia refractory to blood transfusions. Highest on our list of concern is development of rheumatic or postoperative hematoma in the left thigh or in the retroperitoneumas a significant amount of bleeding can occur before patient develops pronounced symptoms which may reach severity over a compartment syndrome.  Recommendations: --CT A/P with thigh extension --aPTT, fibrinogen --As INR is still nearly 2 and the patient's anticoagulation is for purposes of  stroke prevention and not for any previous history of thrombosis, I do recommend additional dosing of parenteral vitamin K -- 2.5-5mg  IV x1 --Repeat Hgb after each pRBC unit and Q6hrs afterwards. Vascular/neuro checks on the Lt LE to monitor for compartment syndrome development --FFP x4 units if still not responding to blood transfusions tonight. If volume is an issue, consider administering prothrombin complex concentrate instead  I will have my colleague over the weekend to follow-up the patient and provide additional recommendations.    Ardath Sax, MD 12/10/2016, 6:09 PM

## 2016-12-10 NOTE — Progress Notes (Signed)
PT Cancellation Note  Patient Details Name: [name] MRN: [id number] DOB: 14-Dec-1940   Cancelled Treatment:    Reason Eval/Treat Not Completed: (P) Patient not medically ready (Pt receiving 1 of 2 units of blood after HGB lab value of 6.2.  Will return after 1st unit is complete if time permits.  )   [name] 12/10/2016, 2:04 PM  Governor Rooks, PTA pager [phone]

## 2016-12-10 NOTE — Progress Notes (Signed)
   Assessment / Plan: 3 Days Post-Op  S/P Procedure(s) (LRB): INTRAMEDULLARY (IM) NAIL FEMORAL (Left) by Dr. Ernesta Amble. Percell Miller on 12/07/16  Principal Problem:   Closed fracture of femur, intertrochanteric, left, initial encounter [facility]) Active Problems:   Essential hypertension   Atrial flutter (H[facility])   Localization-related symptomatic epilepsy and epileptic syndromes with complex partial seizures, not intractable, without status epilepticus ([place])   Iron deficiency anemia due to chronic blood loss   Fall   Protein-calorie malnutrition, severe  Acute Blood Loss Anemia, Hgb 8.9>>>6.2.  Low operative blood loss.  No signs of active bleeding.  Denies lightheadedness, dizziness or weakness.    Left intertrochanteric Femur Fracture S/P IM Nail Continues to be more interactive / conversant.    Pain controlled. Stable from an orthopedic perspective s/p IM nail. Mobilize with therapy when able Incentive Spirometry Apply ice prn  Weight Bearing: Weight Bearing as Tolerated (WBAT) LLE Dressings: PRN Mepilex.  VTE prophylaxis: Resume Coumadin. SCDs, ambulation Dispo: Per primary.  [place] when stable medically.  Subjective: Intermittent soreness. Appetite fair.  Objective:   VITALS:   Vitals:   12/09/16 0606 12/09/16 1430 12/09/16 2100 12/10/16 0403  BP: (!) 110/48 120/61 (!) 122/55 (!) 128/58  Pulse: 94 88 (!) 110 (!) 117  Resp: 16 16 18 20   Temp: 99.5 F (37.5 C) 99 F (37.2 C) 98.9 F (37.2 C) 99 F (37.2 C)  TempSrc: Axillary Oral Oral Oral  SpO2: 99% 98% 100% 99%  Weight:      Height:       CBC Latest Ref Rng & Units 12/10/2016 12/09/2016 12/09/2016  WBC 4.0 - 10.5 K/uL 8.6 - 7.5  Hemoglobin 13.0 - 17.0 g/dL 6.2(LL) 7.5(L) 7.4(L)  Hematocrit 39.0 - 52.0 % 18.8(L) - 22.1(L)  Platelets 150 - 400 K/uL 204 - 129(L)   BMP Latest Ref Rng & Units 12/09/2016 12/08/2016 12/07/2016  Glucose 65 - 99 mg/dL 120(H) 164(H) 102(H)  BUN 6 - 20 mg/dL 13 11 11   Creatinine  0.61 - 1.24 mg/dL 0.72 0.80 0.71  Sodium 135 - 145 mmol/L 138 136 135  Potassium 3.5 - 5.1 mmol/L 3.5 3.6 3.6  Chloride 101 - 111 mmol/L 105 106 103  CO2 22 - 32 mmol/L 28 20(L) 26  Calcium 8.9 - 10.3 mg/dL 7.6(L) 7.4(L) 7.6(L)   Intake/Output      09/20 0701 - 09/21 0700 09/21 0701 - 09/22 0700   P.O. 840    Total Intake(mL/kg) 840 (14.2)    Urine (mL/kg/hr) 700 (0.5)    Total Output 700     Net +140          Urine Occurrence 1 x    Stool Occurrence 1 x      Physical Exam: General: NAD.  Resting comfortably upright in bed.  Talkative. MSK Feet warm.   Dorsiflexion/Plantar flexion intact Reports sensation distally. Incision: dressing C/D/I   Prudencio Burly III, PA-C 12/10/2016, 7:41 AM

## 2016-12-10 NOTE — Progress Notes (Addendum)
CSW spoke with patient's daughter to continue to work towards locating a rehab facility near the [place] area, where the patient has increased family support. Patient's daughter indicated that she had found another facility, called [facility] [phone]) and had discussed with the admissions that there were beds available, requested for CSW to follow up. CSW called and left a voicemail for the admissions coordinator.   CSW followed up on previous referrals sent. CSW left voicemail for [facility] admissions coordinator, as previous CSW note indicates that someone should be back in the admissions office today to discuss the referral.  CSW also attempted to contact [facility], that was previously provided by the daughter; however, this facility is an inpatient rehab facility, not SNF.   CSW will continue to follow to try to locate a facility for the patient that is close to family support and facilitate SNF discharge.  Brent Abbe, LCSW Clinical Social Worker [phone]   12:20 PM UPDATE:  CSW received call from [facility]; representative indicated that they are an inpatient rehab facility, so patient would not be able to admit. CSW called patient's daughter and informed her of updated information. Patient's daughter asked about the difference between inpatient rehab and SNF rehab, and CSW explained. Patient's daughter indicated she would do more research and call back.  CSW to continue to follow.  [name], [place] Clinical Social Worker [phone]

## 2016-12-10 NOTE — Progress Notes (Addendum)
PROGRESS NOTE    [name]   YWV:[id number]  DOB: [birth year]  DOA: 12/05/2016 PCP: [name], MD   Brief Narrative:  [name] 76 year old male with history of prior stroke, mild dementia, seizure disorder, atrial flutter, history of GI bleed presented after trying to swat at a bug and losing his balance. Xray in ER showed left intertrochanteric fracture.     Subjective: No complaints today.    Assessment & Plan:   Principal Problem:   Closed fracture of femur, intertrochanteric, left, initial encounter  -  intramedullary nail fixation - DVT prophylaxis- on Coumadin for A-fib - INR 1.8 today and therefore given Lovenox - SNF per PT - recommend osteoporosis screening at discretion of PCP  Active Problems:    Acute blood loss anemia Iron deficiency anemia due to chronic blood loss - INR was therapeutic when he underwent surgery   - Hb dropped 1gm today which is 3 days after his surgery- he has required 4 U PRBC so far and the last unit did not cause a rise in his Hb at all - will stop Lovenox (for A-flutter) today- d/w ortho, Dr Percell Miller today that he may have a constant oozing of blood- he will evaluate the patient - will ask heme for opinion as well - transfusing 1 u PRBC - check CT abd/pelvis for retroperitoneal hematoma ADDENDUM: CT shows a left hip hematoma. Cont to hold Lovenox and give 2.5 U Vit K to reverse INR- follow closely for more transfusions- I have discussed it with the patient in detail.    Atrial flutter   - see above regarding Lovenox - not on rate controlling agents   Epilepsy - Keppra and Dilantin - Dilantin level supra therapeutic (corrected is 34.4) but daughter states that his neurologist prefers it this way- I have messaged his Neurologist to make her aware - Dr [name] has responded that we should continue Dilantin at this dose unless he has side effects from it.   Takes Prednisone taper for some sore of muscle weakness- per  his daughter, his PCP is weaning it- continue at 10 mg daily which is his home dose    DVT prophylaxis: Coumadin Code Status: Full code Family Communication:  Disposition Plan: home Consultants:   ortho Procedures:    left intramedullary nail fixation Antimicrobials:  Anti-infectives    Start     Dose/Rate Route Frequency Ordered Stop   12/07/16 1830  ceFAZolin (ANCEF) IVPB 1 g/50 mL premix     1 g 100 mL/hr over 30 Minutes Intravenous Every 6 hours 12/07/16 1633 12/08/16 0622       Objective: Vitals:   12/10/16 0403 12/10/16 1129 12/10/16 1145 12/10/16 1415  BP: (!) 128/58 (!) 115/59 (!) 115/50 129/62  Pulse: (!) 117 (!) 107 (!) 102 (!) 110  Resp: 20 18 18 19   Temp: 99 F (37.2 C) (!) 100.6 F (38.1 C) 99.9 F (37.7 C) 100.3 F (37.9 C)  TempSrc: Oral Oral Oral Oral  SpO2: 99% 100% 100%   Weight:      Height:        Intake/Output Summary (Last 24 hours) at 12/10/16 1528 Last data filed at 12/10/16 1415  Gross per 24 hour  Intake             1600 ml  Output              700 ml  Net  900 ml   Filed Weights   12/05/16 2317  Weight: 59 kg (130 lb)    Examination: General exam: Appears comfortable  HEENT: PERRLA, oral mucosa moist, no sclera icterus or thrush Respiratory system: Clear to auscultation. Respiratory effort normal. Cardiovascular system: S1 & S2 heard,  No murmurs  Gastrointestinal system: Abdomen soft, non-tender, nondistended. Normal bowel sound. No organomegaly Central nervous system: Alert and oriented. No focal neurological deficits. Extremities: No cyanosis, clubbing or edema- left thigh slightly swollen- no bruising Skin: No rashes or ulcers Psychiatry:  Mood & affect appropriate.     Data Reviewed: I have personally reviewed following labs and imaging studies  CBC:  Recent Labs Lab 12/05/16 2355 12/07/16 0319 12/08/16 0554 12/08/16 0810 12/08/16 1857 12/09/16 0314 12/09/16 1128 12/10/16 0348  WBC 5.3 6.4 9.0  8.4  --  7.5  --  8.6  NEUTROABS 3.9  --   --   --   --   --   --   --   HGB 8.9* 7.2* 7.3* 7.4* 7.7* 7.4* 7.5* 6.2*  HCT 27.7* 22.7* 22.1* 22.1* 23.4* 22.1*  --  18.8*  MCV 91.1 91.2 89.5 90.2  --  88.4  --  87.4  PLT 257 165 126* 125*  --  129*  --  242   Basic Metabolic Panel:  Recent Labs Lab 12/05/16 2355 12/07/16 0319 12/08/16 0554 12/09/16 0314  NA 136 135 136 138  K 4.2 3.6 3.6 3.5  CL 100* 103 106 105  CO2 29 26 20* 28  GLUCOSE 138* 102* 164* 120*  BUN 13 11 11 13   CREATININE 0.74 0.71 0.80 0.72  CALCIUM 8.3* 7.6* 7.4* 7.6*   GFR: Estimated Creatinine Clearance: 64.5 mL/min (by C-G formula based on SCr of 0.72 mg/dL). Liver Function Tests:  Recent Labs Lab 12/08/16 0554  AST 27  ALT 12*  ALKPHOS 49  BILITOT 0.7  PROT 4.9*  ALBUMIN 2.7*   No results for input(s): LIPASE, AMYLASE in the last 168 hours. No results for input(s): AMMONIA in the last 168 hours. Coagulation Profile:  Recent Labs Lab 12/05/16 2355 12/07/16 0319 12/08/16 0554 12/09/16 0314 12/10/16 0348  INR 1.85 2.61 2.05 1.84 1.97   Cardiac Enzymes: No results for input(s): CKTOTAL, CKMB, CKMBINDEX, TROPONINI in the last 168 hours. BNP (last 3 results) No results for input(s): PROBNP in the last 8760 hours. HbA1C: No results for input(s): HGBA1C in the last 72 hours. CBG:  Recent Labs Lab 12/06/16 1146 12/10/16 1150  GLUCAP 107* 145*   Lipid Profile: No results for input(s): CHOL, HDL, LDLCALC, TRIG, CHOLHDL, LDLDIRECT in the last 72 hours. Thyroid Function Tests: No results for input(s): TSH, T4TOTAL, FREET4, T3FREE, THYROIDAB in the last 72 hours. Anemia Panel: No results for input(s): VITAMINB12, FOLATE, FERRITIN, TIBC, IRON, RETICCTPCT in the last 72 hours. Urine analysis:    Component Value Date/Time   COLORURINE YELLOW 12/01/2015 1300   APPEARANCEUR CLEAR 12/01/2015 1300   LABSPEC 1.017 12/01/2015 1300   PHURINE 5.5 12/01/2015 1300   GLUCOSEU NEGATIVE 12/01/2015  1300   HGBUR NEGATIVE 12/01/2015 1300   BILIRUBINUR NEGATIVE 12/01/2015 1300   KETONESUR NEGATIVE 12/01/2015 1300   PROTEINUR NEGATIVE 12/01/2015 1300   UROBILINOGEN 0.2 06/02/2011 0300   NITRITE NEGATIVE 12/01/2015 1300   LEUKOCYTESUR NEGATIVE 12/01/2015 1300   Sepsis Labs: @LABRCNTIP (procalcitonin:4,lacticidven:4) ) Recent Results (from the past 240 hour(s))  Surgical pcr screen     Status: None   Collection Time: 12/07/16  3:04 AM  Result  Value Ref Range Status   MRSA, PCR NEGATIVE NEGATIVE Final   Staphylococcus aureus NEGATIVE NEGATIVE Final    Comment: (NOTE) The Xpert SA Assay (FDA approved for NASAL specimens in patients 42 years of age and older), is one component of a comprehensive surveillance program. It is not intended to diagnose infection nor to guide or monitor treatment.          Radiology Studies: No results found.    Scheduled Meds: . docusate sodium  100 mg Oral BID  . feeding supplement (ENSURE ENLIVE)  237 mL Oral TID BM  . ferrous sulfate  325 mg Oral TID PC  . levETIRAcetam  1,500 mg Oral BID  . phenytoin  300 mg Oral Daily  . predniSONE  10 mg Oral Q48H  . warfarin  5 mg Oral ONCE-1800  . Warfarin - Pharmacist Dosing Inpatient   Does not apply q1800   Continuous Infusions:    LOS: 4 days    Time spent in minutes: 35    Debbe Odea, MD [facility] Pager: [email or web address] Password [facility] 12/10/2016, 3:28 PM

## 2016-12-10 NOTE — Progress Notes (Signed)
Pt tried to get out of the bed earlier this shift, he was seen sitting up straight with the knee bent  and dangling on the Foot of the bed. An hour later after repositioning him back to bed, Nurse noticed pt surgical dressing closer to the left knee was socked with blood, old dressing removed, new sterile strip and mepilex dressing applied,  Pt denies pain and discomfort, call light within reach,will continue to monitor site.

## 2016-12-10 NOTE — Care Management Important Message (Signed)
Important Message  Patient Details  Name: [name] MRN: [id number] Date of Birth: [birth year]   Medicare Important Message Given:  Yes    Orbie Pyo 12/10/2016, 12:22 PM

## 2016-12-11 DIAGNOSIS — E561 Deficiency of vitamin K: Secondary | ICD-10-CM

## 2016-12-11 DIAGNOSIS — S7002XD Contusion of left hip, subsequent encounter: Secondary | ICD-10-CM

## 2016-12-11 DIAGNOSIS — D689 Coagulation defect, unspecified: Secondary | ICD-10-CM

## 2016-12-11 DIAGNOSIS — S72142A Displaced intertrochanteric fracture of left femur, initial encounter for closed fracture: Principal | ICD-10-CM

## 2016-12-11 DIAGNOSIS — D649 Anemia, unspecified: Secondary | ICD-10-CM

## 2016-12-11 LAB — BASIC METABOLIC PANEL
ANION GAP: 5 (ref 5–15)
BUN: 9 mg/dL (ref 6–20)
CHLORIDE: 104 mmol/L (ref 101–111)
CO2: 29 mmol/L (ref 22–32)
CREATININE: 0.5 mg/dL — AB (ref 0.61–1.24)
Calcium: 7.8 mg/dL — ABNORMAL LOW (ref 8.9–10.3)
GFR calc non Af Amer: >60 mL/min (ref >60)
Glucose, Bld: 101 mg/dL — ABNORMAL HIGH (ref 65–99)
POTASSIUM: 3 mmol/L — AB (ref 3.5–5.1)
SODIUM: 138 mmol/L (ref 135–145)

## 2016-12-11 LAB — GLUCOSE, CAPILLARY
GLUCOSE-CAPILLARY: 105 mg/dL — AB (ref 65–99)
GLUCOSE-CAPILLARY: 125 mg/dL — AB (ref 65–99)
Glucose-Capillary: 116 mg/dL — ABNORMAL HIGH (ref 65–99)
Glucose-Capillary: 144 mg/dL — ABNORMAL HIGH (ref 65–99)

## 2016-12-11 LAB — TYPE AND SCREEN
ABO/RH(D): O NEG
ANTIBODY SCREEN: NEGATIVE
UNIT DIVISION: 0
Unit division: 0

## 2016-12-11 LAB — CBC
HCT: 25.3 % — ABNORMAL LOW (ref 39.0–52.0)
HEMATOCRIT: 23.8 % — AB (ref 39.0–52.0)
HEMOGLOBIN: 8 g/dL — AB (ref 13.0–17.0)
Hemoglobin: 8.5 g/dL — ABNORMAL LOW (ref 13.0–17.0)
MCH: 29.1 pg (ref 26.0–34.0)
MCH: 29.4 pg (ref 26.0–34.0)
MCHC: 33.6 g/dL (ref 30.0–36.0)
MCHC: 33.6 g/dL (ref 30.0–36.0)
MCV: 86.5 fL (ref 78.0–100.0)
MCV: 87.5 fL (ref 78.0–100.0)
PLATELETS: 230 10*3/uL (ref 150–400)
Platelets: 202 10*3/uL (ref 150–400)
RBC: 2.75 MIL/uL — ABNORMAL LOW (ref 4.22–5.81)
RBC: 2.89 MIL/uL — AB (ref 4.22–5.81)
RDW: 17.6 % — ABNORMAL HIGH (ref 11.5–15.5)
RDW: 17.7 % — ABNORMAL HIGH (ref 11.5–15.5)
WBC: 8.1 10*3/uL (ref 4.0–10.5)
WBC: 7.9 10*3/uL (ref 4.0–10.5)

## 2016-12-11 LAB — BPAM RBC
BLOOD PRODUCT EXPIRATION DATE: 201810082359
Blood Product Expiration Date: 201809282359
ISSUE DATE / TIME: 201809211114
ISSUE DATE / TIME: 201809211550
UNIT TYPE AND RH: 9500
Unit Type and Rh: 9500

## 2016-12-11 LAB — MAGNESIUM: MAGNESIUM: 1.3 mg/dL — AB (ref 1.7–2.4)

## 2016-12-11 LAB — PROTIME-INR
INR: 1.44
Prothrombin Time: 17.4 seconds — ABNORMAL HIGH (ref 11.4–15.2)

## 2016-12-11 LAB — LACTATE DEHYDROGENASE: LDH: 281 U/L — AB (ref 98–192)

## 2016-12-11 MED ORDER — BISACODYL 10 MG RE SUPP
10.0000 mg | Freq: Once | RECTAL | Status: AC
  Administered 2016-12-11: 10 mg via RECTAL
  Filled 2016-12-11: qty 1

## 2016-12-11 MED ORDER — WARFARIN - PHARMACIST DOSING INPATIENT: Freq: Every day | Status: DC

## 2016-12-11 MED ORDER — MAGNESIUM SULFATE 2 GM/50ML IV SOLN
2.0000 g | Freq: Once | INTRAVENOUS | Status: AC
  Administered 2016-12-11: 2 g via INTRAVENOUS
  Filled 2016-12-11: qty 50

## 2016-12-11 MED ORDER — POTASSIUM CHLORIDE CRYS ER 20 MEQ PO TBCR
40.0000 meq | EXTENDED_RELEASE_TABLET | ORAL | Status: AC
  Administered 2016-12-11 (×2): 40 meq via ORAL
  Filled 2016-12-11 (×2): qty 2

## 2016-12-11 MED ORDER — WARFARIN SODIUM 5 MG PO TABS: 5.0000 mg | ORAL_TABLET | Freq: Once | ORAL | Status: DC

## 2016-12-11 NOTE — Progress Notes (Signed)
ANTICOAGULATION CONSULT NOTE - Follow Up Consult  Pharmacy Consult:  Coumadin Indication:  History of Aflutter  No Known Allergies  Patient Measurements: Height: 5\' 11"  (180.3 cm) Weight: 130 lb (59 kg) IBW/kg (Calculated) : 75.3  Vital Signs: Temp: 98.8 F (37.1 C) (09/22 0759) Temp Source: Oral (09/22 0759) BP: 105/55 (09/22 0759) Pulse Rate: 89 (09/22 0759)  Labs:  Recent Labs  12/09/16 0314  12/10/16 0348 12/10/16 2306 12/11/16 0411  HGB 7.4*  < > 6.2* 9.0* 8.0*  HCT 22.1*  --  18.8* 26.6* 23.8*  PLT 129*  --  204 210 202  APTT  --   --   --  27  --   LABPROT 21.1*  --  22.2* 18.6* 17.4*  INR 1.84  --  1.97 1.57 1.44  CREATININE 0.72  --   --   --  0.50*  < > = values in this interval not displayed. Estimated Creatinine Clearance: 64.5 mL/min (A) (by C-G formula based on SCr of 0.5 mg/dL (L)).  Assessment: 3 YOM with history of Aflutter and CVA on Coumadin 5mg  PO daily PTA for Aflutter/CVA. S/p IM nail 9/18. Bridging with Lovenox but stopped when INR was close to therapeutic. Then given Vit K IV. Now INR dropped to 1.44. CT showed left iliacus and iliopsoas hematoma, also has some mild surrounding hemorrhage from hardware fixation.  Goal of Therapy:  INR 2-3 Monitor platelets by anticoagulation protocol: Yes   Plan:  Will hold Coumadin for now Monitor daily INR, CBC, improvement in bleeding F/U restart of anticoag  Elenor Quinones, PharmD, [name] Clinical Pharmacist Pager [phone] 12/11/2016 9:49 AM

## 2016-12-11 NOTE — Progress Notes (Signed)
IP PROGRESS NOTE  Subjective:   Patient reports no complaints. Sleeping this am but easily aroused. No bleeding noted.   Objective:  Vital signs in last 24 hours: Temp:  [98.4 F (36.9 C)-100.6 F (38.1 C)] 98.8 F (37.1 C) (09/22 0759) Pulse Rate:  [89-112] 89 (09/22 0759) Resp:  [17-19] 18 (09/21 1900) BP: (105-129)/(50-62) 105/55 (09/22 0759) SpO2:  [99 %-100 %] 99 % (09/22 0759) Weight change:  Last BM Date: 12/09/16  Intake/Output from previous day: 09/21 0701 - 09/22 0700 In: 1675 [P.O.:480; I.V.:250; Blood:945] Out: 700 [Urine:700] Alert, NAD Mouth: mucous membranes moist, pharynx normal without lesions Resp: clear to auscultation bilaterally Cardio: regular rate and rhythm, S1, S2 normal, no murmur, click, rub or gallop GI: soft, non-tender; bowel sounds normal; no masses,  no organomegaly Extremities: extremities normal, atraumatic, no cyanosis or edema    Lab Results:  Recent Labs  12/10/16 2306 12/11/16 0411  WBC 10.0 8.1  HGB 9.0* 8.0*  HCT 26.6* 23.8*  PLT 210 202    BMET  Recent Labs  12/09/16 0314 12/11/16 0411  NA 138 138  K 3.5 3.0*  CL 105 104  CO2 28 29  GLUCOSE 120* 101*  BUN 13 9  CREATININE 0.72 0.50*  CALCIUM 7.6* 7.8*    Studies/Results: Ct Abdomen Pelvis Wo Contrast  Result Date: 12/10/2016 CLINICAL DATA:  Unexplained anemia. Clinical concern for retroperitoneal hemorrhage. EXAM: CT ABDOMEN AND PELVIS WITHOUT CONTRAST TECHNIQUE: Multidetector CT imaging of the abdomen and pelvis was performed following the standard protocol without IV contrast. COMPARISON:  10/17/2015. FINDINGS: Lower chest: Mild left lower lobe atelectasis. Minimal pericardial fluid measuring 5 mm in maximum thickness, without significant change. Hepatobiliary: Multiple calcified granulomata throughout the liver again demonstrated. Contracted gallbladder. Pancreas: Diffuse pancreatic atrophy. Spleen: Multiple calcified granulomata Adrenals/Urinary Tract: 3 mm  lower pole calculus. Normal appearing right kidney, ureters and urinary bladder. No hydronephrosis. Normal appearing adrenal glands. Stomach/Bowel: Prominent stool and gas in the colon. Fluid in the rectum. Unremarkable stomach and small bowel. No evidence of appendicitis. Vascular/Lymphatic: Mild atheromatous arterial calcifications without aneurysm. No enlarged lymph nodes. Reproductive: Prostate is unremarkable. Other: Left iliacus and iliopsoas hematoma with moderate enlargement of those muscles. Diffuse subcutaneous edema. Small amount of free peritoneal fluid in the presacral region. Small bilateral inguinal hernias containing fat. Musculoskeletal: Hardware fixation of a comminuted proximal left femur fracture. There is some hemorrhage in the surrounding soft tissues. Extensive lumbar and lower thoracic spine degenerative changes, including changes of DISH. IMPRESSION: 1. Left iliacus and iliopsoas hematoma. 2. Hardware fixation of a comminuted proximal left femur fracture with mild surrounding hemorrhage and edema. 3. Prominent stool and gas in the colon and fluid in the rectum. 4. Diffuse subcutaneous edema. 5. Minimal ascites. 6. Stable minimal pericardial effusion. 7. Stable small, nonobstructing lower pole left renal calculus. Electronically Signed   By: Claudie Revering [name]   On: 12/10/2016 17:46    Medications: I have reviewed the patient's current medications.  Assessment/Plan:  76 year old male with:  1.Left intertrochanteric fracture of the left femur due to mechanical fall while receiving therapeutic anticoagulation. He is S/P surgical fixation on 12/07/2016.   2. Anemia: related to hematoma as evident on his CT scan. Hgb dropped again but his PT and INR are improving.  I recommend continue supportive and monitoring his hemoglobin. He is receiving vitamin K which will reverse his anticoagulation.  3. Coagulopathy: likely related due to vitamin K deficiency. I see no evidence of DIC or any  need for plasma transfusion or cryoprecipitate transfusion.     LOS: 5 days   Zola Button 12/11/2016, 8:58 AM

## 2016-12-11 NOTE — Progress Notes (Addendum)
PROGRESS NOTE    [name]   XBM:[id number]  DOB: 02/11/1941  DOA: 12/05/2016 PCP: [name], MD   Brief Narrative:  [name] 76 year old male with history of prior stroke, mild dementia, seizure disorder, atrial flutter on Coumadin, history of GI bleed presented after trying to swat at a bug and losing his balance. Xray in ER showed left intertrochanteric fracture.     Subjective: No complaints. Per RN he was very restless last night.    Assessment & Plan:   Principal Problem:   Closed fracture of femur, intertrochanteric, left, initial encounter  -  intramedullary nail fixation - DVT prophylaxis- SCDs while bleeding (see below) - SNF per PT - recommend osteoporosis screening at discretion of PCP  Active Problems:    Acute blood loss anemia Iron deficiency anemia due to chronic blood loss - INR was therapeutic when he underwent surgery   - 9/21- Hb dropped 1gm today which is 3 days after his surgery- he has required 4 U PRBC so far and the last unit did not cause a rise in his Hb at all- will stop Lovenox (for A-flutter) today- d/w ortho, Dr Percell Miller today that he may have a constant oozing of blood  - will ask heme for opinion as well- transfusing 1 u PRBC-  CT performed later showed a moderate sized left hip hematoma which is likely the cause of the anemia.- Give 2.5 U Vit K to reverse INR- follow closely for more transfusions- I have discussed it with the patient in detail.  - 9/22- INR 1.44 today- Hb 8- cont to follow  Hypokalemia and hypomagnesemia - replace and follow   Atrial flutter   - see above regarding Lovenox - is s/p ablation and not on rate controlling agents   Epilepsy - Keppra and Dilantin - Dilantin level supra therapeutic (corrected is 34.4) but daughter states that his neurologist prefers it this way- I have messaged his Neurologist to make her aware - Dr [name] has responded that we should continue Dilantin at this dose unless  he has side effects from it.   Takes Prednisone taper for some sore of muscle weakness- per his daughter, his PCP is weaning it- continue at 10 mg daily which is his home dose    DVT prophylaxis: Coumadin Code Status: Full code Family Communication:  Disposition Plan: home Consultants:   ortho Procedures:    left intramedullary nail fixation Antimicrobials:  Anti-infectives    Start     Dose/Rate Route Frequency Ordered Stop   12/07/16 1830  ceFAZolin (ANCEF) IVPB 1 g/50 mL premix     1 g 100 mL/hr over 30 Minutes Intravenous Every 6 hours 12/07/16 1633 12/08/16 0622       Objective: Vitals:   12/10/16 1900 12/10/16 1937 12/11/16 0759 12/11/16 1415  BP: 120/62 117/60 (!) 105/55 (!) 103/51  Pulse: (!) 102 (!) 101 89 95  Resp: 18   18  Temp: 98.4 F (36.9 C) 99.9 F (37.7 C) 98.8 F (37.1 C) 99.1 F (37.3 C)  TempSrc: Oral Oral Oral Oral  SpO2: 100% 100% 99% 100%  Weight:      Height:        Intake/Output Summary (Last 24 hours) at 12/11/16 1525 Last data filed at 12/10/16 1900  Gross per 24 hour  Intake              315 ml  Output  0 ml  Net              315 ml   Filed Weights   12/05/16 2317  Weight: 59 kg (130 lb)    Examination: General exam: Appears comfortable  HEENT: PERRLA, oral mucosa moist, no sclera icterus or thrush Respiratory system: Clear to auscultation. Respiratory effort normal. Cardiovascular system: S1 & S2 heard,  No murmurs  Gastrointestinal system: Abdomen soft, non-tender, nondistended. Normal bowel sound. No organomegaly Central nervous system: Alert and oriented. No focal neurological deficits. Extremities: No cyanosis, clubbing - edema in left thigh/hip noted Skin: No rashes or ulcers Psychiatry:  Mood & affect appropriate.     Data Reviewed: I have personally reviewed following labs and imaging studies  CBC:  Recent Labs Lab 12/05/16 2355  12/08/16 0810 12/08/16 1857 12/09/16 0314 12/09/16 1128  12/10/16 0348 12/10/16 2306 12/11/16 0411  WBC 5.3  < > 8.4  --  7.5  --  8.6 10.0 8.1  NEUTROABS 3.9  --   --   --   --   --   --  7.7  --   HGB 8.9*  < > 7.4* 7.7* 7.4* 7.5* 6.2* 9.0* 8.0*  HCT 27.7*  < > 22.1* 23.4* 22.1*  --  18.8* 26.6* 23.8*  MCV 91.1  < > 90.2  --  88.4  --  87.4 86.9 86.5  PLT 257  < > 125*  --  129*  --  204 210 202  < > = values in this interval not displayed. Basic Metabolic Panel:  Recent Labs Lab 12/05/16 2355 12/07/16 0319 12/08/16 0554 12/09/16 0314 12/11/16 0411 12/11/16 0805  NA 136 135 136 138 138  --   K 4.2 3.6 3.6 3.5 3.0*  --   CL 100* 103 106 105 104  --   CO2 29 26 20* 28 29  --   GLUCOSE 138* 102* 164* 120* 101*  --   BUN 13 11 11 13 9   --   CREATININE 0.74 0.71 0.80 0.72 0.50*  --   CALCIUM 8.3* 7.6* 7.4* 7.6* 7.8*  --   MG  --   --   --   --   --  1.3*   GFR: Estimated Creatinine Clearance: 64.5 mL/min (A) (by C-G formula based on SCr of 0.5 mg/dL (L)). Liver Function Tests:  Recent Labs Lab 12/08/16 0554  AST 27  ALT 12*  ALKPHOS 49  BILITOT 0.7  PROT 4.9*  ALBUMIN 2.7*   No results for input(s): LIPASE, AMYLASE in the last 168 hours. No results for input(s): AMMONIA in the last 168 hours. Coagulation Profile:  Recent Labs Lab 12/08/16 0554 12/09/16 0314 12/10/16 0348 12/10/16 2306 12/11/16 0411  INR 2.05 1.84 1.97 1.57 1.44   Cardiac Enzymes: No results for input(s): CKTOTAL, CKMB, CKMBINDEX, TROPONINI in the last 168 hours. BNP (last 3 results) No results for input(s): PROBNP in the last 8760 hours. HbA1C: No results for input(s): HGBA1C in the last 72 hours. CBG:  Recent Labs Lab 12/06/16 1146 12/10/16 1150 12/11/16 0705 12/11/16 1159  GLUCAP 107* 145* 116* 105*   Lipid Profile: No results for input(s): CHOL, HDL, LDLCALC, TRIG, CHOLHDL, LDLDIRECT in the last 72 hours. Thyroid Function Tests: No results for input(s): TSH, T4TOTAL, FREET4, T3FREE, THYROIDAB in the last 72 hours. Anemia  Panel: No results for input(s): VITAMINB12, FOLATE, FERRITIN, TIBC, IRON, RETICCTPCT in the last 72 hours. Urine analysis:    Component Value Date/Time   COLORURINE  YELLOW 12/01/2015 1300   APPEARANCEUR CLEAR 12/01/2015 1300   LABSPEC 1.017 12/01/2015 1300   PHURINE 5.5 12/01/2015 1300   GLUCOSEU NEGATIVE 12/01/2015 1300   HGBUR NEGATIVE 12/01/2015 1300   BILIRUBINUR NEGATIVE 12/01/2015 1300   KETONESUR NEGATIVE 12/01/2015 1300   PROTEINUR NEGATIVE 12/01/2015 1300   UROBILINOGEN 0.2 06/02/2011 0300   NITRITE NEGATIVE 12/01/2015 1300   LEUKOCYTESUR NEGATIVE 12/01/2015 1300   Sepsis Labs: @LABRCNTIP (procalcitonin:4,lacticidven:4) ) Recent Results (from the past 240 hour(s))  Surgical pcr screen     Status: None   Collection Time: 12/07/16  3:04 AM  Result Value Ref Range Status   MRSA, PCR NEGATIVE NEGATIVE Final   Staphylococcus aureus NEGATIVE NEGATIVE Final    Comment: (NOTE) The Xpert SA Assay (FDA approved for NASAL specimens in patients 22 years of age and older), is one component of a comprehensive surveillance program. It is not intended to diagnose infection nor to guide or monitor treatment.          Radiology Studies: Ct Abdomen Pelvis Wo Contrast  Result Date: 12/10/2016 CLINICAL DATA:  Unexplained anemia. Clinical concern for retroperitoneal hemorrhage. EXAM: CT ABDOMEN AND PELVIS WITHOUT CONTRAST TECHNIQUE: Multidetector CT imaging of the abdomen and pelvis was performed following the standard protocol without IV contrast. COMPARISON:  10/17/2015. FINDINGS: Lower chest: Mild left lower lobe atelectasis. Minimal pericardial fluid measuring 5 mm in maximum thickness, without significant change. Hepatobiliary: Multiple calcified granulomata throughout the liver again demonstrated. Contracted gallbladder. Pancreas: Diffuse pancreatic atrophy. Spleen: Multiple calcified granulomata Adrenals/Urinary Tract: 3 mm lower pole calculus. Normal appearing right kidney,  ureters and urinary bladder. No hydronephrosis. Normal appearing adrenal glands. Stomach/Bowel: Prominent stool and gas in the colon. Fluid in the rectum. Unremarkable stomach and small bowel. No evidence of appendicitis. Vascular/Lymphatic: Mild atheromatous arterial calcifications without aneurysm. No enlarged lymph nodes. Reproductive: Prostate is unremarkable. Other: Left iliacus and iliopsoas hematoma with moderate enlargement of those muscles. Diffuse subcutaneous edema. Small amount of free peritoneal fluid in the presacral region. Small bilateral inguinal hernias containing fat. Musculoskeletal: Hardware fixation of a comminuted proximal left femur fracture. There is some hemorrhage in the surrounding soft tissues. Extensive lumbar and lower thoracic spine degenerative changes, including changes of DISH. IMPRESSION: 1. Left iliacus and iliopsoas hematoma. 2. Hardware fixation of a comminuted proximal left femur fracture with mild surrounding hemorrhage and edema. 3. Prominent stool and gas in the colon and fluid in the rectum. 4. Diffuse subcutaneous edema. 5. Minimal ascites. 6. Stable minimal pericardial effusion. 7. Stable small, nonobstructing lower pole left renal calculus. Electronically Signed   By: Claudie Revering [name]   On: 12/10/2016 17:46      Scheduled Meds: . docusate sodium  100 mg Oral BID  . feeding supplement (ENSURE ENLIVE)  237 mL Oral TID BM  . ferrous sulfate  325 mg Oral TID PC  . levETIRAcetam  1,500 mg Oral BID  . phenytoin  300 mg Oral Daily  . predniSONE  10 mg Oral Q48H   Continuous Infusions:    LOS: 5 days    Time spent in minutes: [place], MD [facility] Pager: [email or web address] Password [name] 12/11/2016, 3:25 PM

## 2016-12-11 NOTE — Progress Notes (Signed)
Orthopedic Trauma Service Progress Note   Patient ID: [name] MRN: [id number] DOB/AGE: 1940/11/03 76 y.o.  Subjective:  Ortho issues stable Sleeping but easily aroused   ROS As above  Objective:   VITALS:   Vitals:   12/10/16 1630 12/10/16 1900 12/10/16 1937 12/11/16 0759  BP: (!) 117/55 120/62 117/60 (!) 105/55  Pulse: (!) 105 (!) 102 (!) 101 89  Resp: 18 18    Temp: 99.8 F (37.7 C) 98.4 F (36.9 C) 99.9 F (37.7 C) 98.8 F (37.1 C)  TempSrc: Oral Oral Oral Oral  SpO2: 100% 100% 100% 99%  Weight:      Height:        Estimated body mass index is 18.13 kg/m as calculated from the following:   Height as of this encounter: 5\' 11"  (1.803 m).   Weight as of this encounter: 59 kg (130 lb).   Intake/Output      09/21 0701 - 09/22 0700 09/22 0701 - 09/23 0700   P.O. 480    I.V. (mL/kg) 250 (4.2)    Blood 945    Total Intake(mL/kg) 1675 (28.4)    Urine (mL/kg/hr) 700 (0.5)    Total Output 700     Net +975          Stool Occurrence 1 x      LABS  Results for orders placed or performed during the hospital encounter of 12/05/16 (from the past 24 hour(s))  Glucose, capillary     Status: Abnormal   Collection Time: 12/10/16 11:50 AM  Result Value Ref Range   Glucose-Capillary 145 (H) 65 - 99 mg/dL  Fibrinogen     Status: Abnormal   Collection Time: 12/10/16 11:06 PM  Result Value Ref Range   Fibrinogen 515 (H) 210 - 475 mg/dL  APTT     Status: None   Collection Time: 12/10/16 11:06 PM  Result Value Ref Range   aPTT 27 24 - 36 seconds  CBC with Differential     Status: Abnormal   Collection Time: 12/10/16 11:06 PM  Result Value Ref Range   WBC 10.0 4.0 - 10.5 K/uL   RBC 3.06 (L) 4.22 - 5.81 MIL/uL   Hemoglobin 9.0 (L) 13.0 - 17.0 g/dL   HCT 26.6 (L) 39.0 - 52.0 %   MCV 86.9 78.0 - 100.0 fL   MCH 29.4 26.0 - 34.0 pg   MCHC 33.8 30.0 - 36.0 g/dL   RDW 17.5 (H) 11.5 - 15.5 %   Platelets 210 150 - 400 K/uL   Neutrophils  Relative % 77 %   Neutro Abs 7.7 1.7 - 7.7 K/uL   Lymphocytes Relative 11 %   Lymphs Abs 1.1 0.7 - 4.0 K/uL   Monocytes Relative 12 %   Monocytes Absolute 1.2 (H) 0.1 - 1.0 K/uL   Eosinophils Relative 0 %   Eosinophils Absolute 0.0 0.0 - 0.7 K/uL   Basophils Relative 0 %   Basophils Absolute 0.0 0.0 - 0.1 K/uL  Lactate dehydrogenase     Status: Abnormal   Collection Time: 12/10/16 11:06 PM  Result Value Ref Range   LDH 281 (H) 98 - 192 U/L  Protime-INR     Status: Abnormal   Collection Time: 12/10/16 11:06 PM  Result Value Ref Range   Prothrombin Time 18.6 (H) 11.4 - 15.2 seconds   INR 1.57   Protime-INR     Status: Abnormal   Collection Time: 12/11/16  4:11 AM  Result Value Ref Range  Prothrombin Time 17.4 (H) 11.4 - 15.2 seconds   INR 1.44   CBC     Status: Abnormal   Collection Time: 12/11/16  4:11 AM  Result Value Ref Range   WBC 8.1 4.0 - 10.5 K/uL   RBC 2.75 (L) 4.22 - 5.81 MIL/uL   Hemoglobin 8.0 (L) 13.0 - 17.0 g/dL   HCT 23.8 (L) 39.0 - 52.0 %   MCV 86.5 78.0 - 100.0 fL   MCH 29.1 26.0 - 34.0 pg   MCHC 33.6 30.0 - 36.0 g/dL   RDW 17.6 (H) 11.5 - 15.5 %   Platelets 202 150 - 400 K/uL  Basic metabolic panel     Status: Abnormal   Collection Time: 12/11/16  4:11 AM  Result Value Ref Range   Sodium 138 135 - 145 mmol/L   Potassium 3.0 (L) 3.5 - 5.1 mmol/L   Chloride 104 101 - 111 mmol/L   CO2 29 22 - 32 mmol/L   Glucose, Bld 101 (H) 65 - 99 mg/dL   BUN 9 6 - 20 mg/dL   Creatinine, Ser 0.50 (L) 0.61 - 1.24 mg/dL   Calcium 7.8 (L) 8.9 - 10.3 mg/dL   GFR calc non Af Amer >60 >60 mL/min   GFR calc Af Amer >60 >60 mL/min   Anion gap 5 5 - 15  Glucose, capillary     Status: Abnormal   Collection Time: 12/11/16  7:05 AM  Result Value Ref Range   Glucose-Capillary 116 (H) 65 - 99 mg/dL   Comment 1 Document in Chart   Magnesium     Status: Abnormal   Collection Time: 12/11/16  8:05 AM  Result Value Ref Range   Magnesium 1.3 (L) 1.7 - 2.4 mg/dL     PHYSICAL  EXAM:   Gen: comfortable appearing, sleeping, easily aroused, participatory in exam   Ext:       Left Lower Extremity   Dressings stable  Incisions look good, steri-strips intact  Areas of ecchymosis around incisions  Amount of swelling to Leg looks appropriate   Thigh is soft   Ext warm  DPN, SPN, TN sensation intact  EHL, FHL, AT, PT, peroneals, gastroc motor intact  + DP pulse     Assessment/Plan: 4 Days Post-Op   Principal Problem:   Closed fracture of femur, intertrochanteric, left, initial encounter ([place]) Active Problems:   Essential hypertension   Atrial flutter (HCC)   Localization-related symptomatic epilepsy and epileptic syndromes with complex partial seizures, not intractable, without status epilepticus (HCC)   Iron deficiency anemia due to chronic blood loss   Fall   Protein-calorie malnutrition, severe   Anti-infectives    Start     Dose/Rate Route Frequency Ordered Stop   12/07/16 1830  ceFAZolin (ANCEF) IVPB 1 g/50 mL premix     1 g 100 mL/hr over 30 Minutes Intravenous Every 6 hours 12/07/16 1633 12/08/16 0622    .  POD/HD#: 20  76 y/o male s/p fall with Left hip fracture   -Left intertrochanteric femur fracture s/p IMN  WBAT  ROM as tolerated  Therapies  Ice prn   Dressing changes as needed   - Pain management:  Minimize narcotics  Tylenol first then narcotics if pain uncontrolled  - ABL anemia/Hemodynamics  H/H down from post transfusion yesterday   pts admission H/H was only 8.9/27.7  No symptoms  Moderate hematoma noted on CT scan in iliacus and psoas muscles     Ortho issues stable: fracture stabilized, no  increased swelling to L thigh   Continue per heme/onc and medicine   - Medical issues   Per primary service   Hypokalemia and hypomagnesemia   - DVT/PE prophylaxis:  Per primary service   - Dispo:  Ortho issues addressed  Clinically pt appears well     Jari Pigg, PA-C [facility] [phone]  (P) [phone] (O) 12/11/2016, 10:02 AM

## 2016-12-12 LAB — BASIC METABOLIC PANEL
Anion gap: 4 — ABNORMAL LOW (ref 5–15)
BUN: 10 mg/dL (ref 6–20)
CO2: 29 mmol/L (ref 22–32)
Calcium: 7.8 mg/dL — ABNORMAL LOW (ref 8.9–10.3)
Chloride: 105 mmol/L (ref 101–111)
Creatinine, Ser: 0.56 mg/dL — ABNORMAL LOW (ref 0.61–1.24)
GFR calc Af Amer: >60 mL/min (ref >60)
Glucose, Bld: 91 mg/dL (ref 65–99)
POTASSIUM: 3.5 mmol/L (ref 3.5–5.1)
SODIUM: 138 mmol/L (ref 135–145)

## 2016-12-12 LAB — CBC
HCT: 25.1 % — ABNORMAL LOW (ref 39.0–52.0)
Hemoglobin: 8.3 g/dL — ABNORMAL LOW (ref 13.0–17.0)
MCH: 29.3 pg (ref 26.0–34.0)
MCHC: 33.1 g/dL (ref 30.0–36.0)
MCV: 88.7 fL (ref 78.0–100.0)
PLATELETS: 258 10*3/uL (ref 150–400)
RBC: 2.83 MIL/uL — AB (ref 4.22–5.81)
RDW: 17.9 % — ABNORMAL HIGH (ref 11.5–15.5)
WBC: 9 10*3/uL (ref 4.0–10.5)

## 2016-12-12 LAB — PROTIME-INR
INR: 1.23
PROTHROMBIN TIME: 15.4 s — AB (ref 11.4–15.2)

## 2016-12-12 LAB — GLUCOSE, CAPILLARY: GLUCOSE-CAPILLARY: 96 mg/dL (ref 65–99)

## 2016-12-12 LAB — HAPTOGLOBIN: HAPTOGLOBIN: 127 mg/dL (ref 34–200)

## 2016-12-12 NOTE — Progress Notes (Signed)
PROGRESS NOTE    [name]   WNU:[id number]  DOB: Jan 22, 1941  DOA: 12/05/2016 PCP: [name], MD   Brief Narrative:  [name] 76 year old male with history of prior stroke, mild dementia, seizure disorder, atrial flutter on Coumadin, history of GI bleed presented after trying to swat at a bug and losing his balance. Xray in ER showed left intertrochanteric fracture.     Subjective: No complaints today.  ROS: no nausea, constipation, dysuria, cough   Assessment & Plan:   Principal Problem:   Closed fracture of femur, intertrochanteric, left, initial encounter  -  intramedullary nail fixation - DVT prophylaxis- SCDs while bleeding (see below) - SNF per PT - recommend osteoporosis screening at discretion of PCP  Active Problems:    Acute blood loss anemia Iron deficiency anemia due to chronic blood loss - INR was therapeutic when he underwent surgery   - 9/21- Hb dropped 1gm today which is 3 days after his surgery- he has required 4 U PRBC so far and the last unit did not cause a rise in his Hb at all- will stop Lovenox (for A-flutter) today- d/w ortho, Dr Percell Miller today that he may have a constant oozing of blood  - will ask heme for opinion as well- transfusing 1 u PRBC-  CT performed later showed a moderate sized left hip hematoma which is likely the cause of the anemia.- Give 2.5 U Vit K to reverse INR- follow closely for more transfusions- I have discussed it with the patient in detail.  - 9/22- INR 1.44 today- Hb 8- cont to follow - 9/23- INR 1.23, Hb 8.3-     Hypokalemia and hypomagnesemia - replaced - follow   Atrial flutter   - see above regarding Lovenox - is s/p ablation and not on rate controlling agents   Epilepsy - Keppra and Dilantin - Dilantin level supra therapeutic (corrected is 34.4) but daughter states that his neurologist prefers it this way- I have messaged his Neurologist to make her aware - Dr [name] has responded that we  should continue Dilantin at this dose unless he has side effects from it.   Takes Prednisone taper for some sore of muscle weakness- per his daughter, his PCP is weaning it- continue at 10 mg QOD which is his home dose    DVT prophylaxis: Coumadin Code Status: Full code Family Communication:  Disposition Plan: home Consultants:   ortho Procedures:    left intramedullary nail fixation Antimicrobials:  Anti-infectives    Start     Dose/Rate Route Frequency Ordered Stop   12/07/16 1830  ceFAZolin (ANCEF) IVPB 1 g/50 mL premix     1 g 100 mL/hr over 30 Minutes Intravenous Every 6 hours 12/07/16 1633 12/08/16 0622       Objective: Vitals:   12/11/16 1856 12/11/16 2023 12/12/16 0601 12/12/16 1323  BP:  (!) 102/56 (!) 108/52 (!) 115/58  Pulse:  92 96 94  Resp:    19  Temp: 98.8 F (37.1 C) 99.9 F (37.7 C) 100 F (37.8 C) 98.4 F (36.9 C)  TempSrc: Oral Oral Oral Oral  SpO2:  98% 97% 98%  Weight:      Height:        Intake/Output Summary (Last 24 hours) at 12/12/16 1543 Last data filed at 12/12/16 1324  Gross per 24 hour  Intake              340 ml  Output  700 ml  Net             -360 ml   Filed Weights   12/05/16 2317  Weight: 59 kg (130 lb)    Examination: General exam: Appears comfortable  HEENT: PERRLA, oral mucosa moist, no sclera icterus or thrush Respiratory system: Clear to auscultation. Respiratory effort normal. Cardiovascular system: S1 & S2 heard,  No murmurs  Gastrointestinal system: Abdomen soft, non-tender, nondistended. Normal bowel sound. No organomegaly Central nervous system: Alert and oriented. No focal neurological deficits. Extremities: No cyanosis, clubbing - left thigh and hip swelling present Skin: No rashes or ulcers Psychiatry:  Mood & affect appropriate.     Data Reviewed: I have personally reviewed following labs and imaging studies  CBC:  Recent Labs Lab 12/05/16 2355  12/10/16 0348 12/10/16 2306  12/11/16 0411 12/11/16 1532 12/12/16 0254  WBC 5.3  < > 8.6 10.0 8.1 7.9 9.0  NEUTROABS 3.9  --   --  7.7  --   --   --   HGB 8.9*  < > 6.2* 9.0* 8.0* 8.5* 8.3*  HCT 27.7*  < > 18.8* 26.6* 23.8* 25.3* 25.1*  MCV 91.1  < > 87.4 86.9 86.5 87.5 88.7  PLT 257  < > 204 210 202 230 258  < > = values in this interval not displayed. Basic Metabolic Panel:  Recent Labs Lab 12/07/16 0319 12/08/16 0554 12/09/16 0314 12/11/16 0411 12/11/16 0805 12/12/16 0254  NA 135 136 138 138  --  138  K 3.6 3.6 3.5 3.0*  --  3.5  CL 103 106 105 104  --  105  CO2 26 20* 28 29  --  29  GLUCOSE 102* 164* 120* 101*  --  91  BUN 11 11 13 9   --  10  CREATININE 0.71 0.80 0.72 0.50*  --  0.56*  CALCIUM 7.6* 7.4* 7.6* 7.8*  --  7.8*  MG  --   --   --   --  1.3*  --    GFR: Estimated Creatinine Clearance: 64.5 mL/min (A) (by C-G formula based on SCr of 0.56 mg/dL (L)). Liver Function Tests:  Recent Labs Lab 12/08/16 0554  AST 27  ALT 12*  ALKPHOS 49  BILITOT 0.7  PROT 4.9*  ALBUMIN 2.7*   No results for input(s): LIPASE, AMYLASE in the last 168 hours. No results for input(s): AMMONIA in the last 168 hours. Coagulation Profile:  Recent Labs Lab 12/09/16 0314 12/10/16 0348 12/10/16 2306 12/11/16 0411 12/12/16 0254  INR 1.84 1.97 1.57 1.44 1.23   Cardiac Enzymes: No results for input(s): CKTOTAL, CKMB, CKMBINDEX, TROPONINI in the last 168 hours. BNP (last 3 results) No results for input(s): PROBNP in the last 8760 hours. HbA1C: No results for input(s): HGBA1C in the last 72 hours. CBG:  Recent Labs Lab 12/11/16 0705 12/11/16 1159 12/11/16 1626 12/11/16 2026 12/12/16 0604  GLUCAP 116* 105* 125* 144* 96   Lipid Profile: No results for input(s): CHOL, HDL, LDLCALC, TRIG, CHOLHDL, LDLDIRECT in the last 72 hours. Thyroid Function Tests: No results for input(s): TSH, T4TOTAL, FREET4, T3FREE, THYROIDAB in the last 72 hours. Anemia Panel: No results for input(s): VITAMINB12, FOLATE,  FERRITIN, TIBC, IRON, RETICCTPCT in the last 72 hours. Urine analysis:    Component Value Date/Time   COLORURINE YELLOW 12/01/2015 1300   APPEARANCEUR CLEAR 12/01/2015 1300   LABSPEC 1.017 12/01/2015 1300   PHURINE 5.5 12/01/2015 1300   GLUCOSEU NEGATIVE 12/01/2015 1300   HGBUR NEGATIVE  12/01/2015 1300   BILIRUBINUR NEGATIVE 12/01/2015 1300   KETONESUR NEGATIVE 12/01/2015 1300   PROTEINUR NEGATIVE 12/01/2015 1300   UROBILINOGEN 0.2 06/02/2011 0300   NITRITE NEGATIVE 12/01/2015 1300   LEUKOCYTESUR NEGATIVE 12/01/2015 1300   Sepsis Labs: @LABRCNTIP (procalcitonin:4,lacticidven:4) ) Recent Results (from the past 240 hour(s))  Surgical pcr screen     Status: None   Collection Time: 12/07/16  3:04 AM  Result Value Ref Range Status   MRSA, PCR NEGATIVE NEGATIVE Final   Staphylococcus aureus NEGATIVE NEGATIVE Final    Comment: (NOTE) The Xpert SA Assay (FDA approved for NASAL specimens in patients 2 years of age and older), is one component of a comprehensive surveillance program. It is not intended to diagnose infection nor to guide or monitor treatment.          Radiology Studies: Ct Abdomen Pelvis Wo Contrast  Result Date: 12/10/2016 CLINICAL DATA:  Unexplained anemia. Clinical concern for retroperitoneal hemorrhage. EXAM: CT ABDOMEN AND PELVIS WITHOUT CONTRAST TECHNIQUE: Multidetector CT imaging of the abdomen and pelvis was performed following the standard protocol without IV contrast. COMPARISON:  10/17/2015. FINDINGS: Lower chest: Mild left lower lobe atelectasis. Minimal pericardial fluid measuring 5 mm in maximum thickness, without significant change. Hepatobiliary: Multiple calcified granulomata throughout the liver again demonstrated. Contracted gallbladder. Pancreas: Diffuse pancreatic atrophy. Spleen: Multiple calcified granulomata Adrenals/Urinary Tract: 3 mm lower pole calculus. Normal appearing right kidney, ureters and urinary bladder. No hydronephrosis. Normal  appearing adrenal glands. Stomach/Bowel: Prominent stool and gas in the colon. Fluid in the rectum. Unremarkable stomach and small bowel. No evidence of appendicitis. Vascular/Lymphatic: Mild atheromatous arterial calcifications without aneurysm. No enlarged lymph nodes. Reproductive: Prostate is unremarkable. Other: Left iliacus and iliopsoas hematoma with moderate enlargement of those muscles. Diffuse subcutaneous edema. Small amount of free peritoneal fluid in the presacral region. Small bilateral inguinal hernias containing fat. Musculoskeletal: Hardware fixation of a comminuted proximal left femur fracture. There is some hemorrhage in the surrounding soft tissues. Extensive lumbar and lower thoracic spine degenerative changes, including changes of DISH. IMPRESSION: 1. Left iliacus and iliopsoas hematoma. 2. Hardware fixation of a comminuted proximal left femur fracture with mild surrounding hemorrhage and edema. 3. Prominent stool and gas in the colon and fluid in the rectum. 4. Diffuse subcutaneous edema. 5. Minimal ascites. 6. Stable minimal pericardial effusion. 7. Stable small, nonobstructing lower pole left renal calculus. Electronically Signed   By: Claudie Revering [name]   On: 12/10/2016 17:46      Scheduled Meds: . docusate sodium  100 mg Oral BID  . feeding supplement (ENSURE ENLIVE)  237 mL Oral TID BM  . ferrous sulfate  325 mg Oral TID PC  . levETIRAcetam  1,500 mg Oral BID  . phenytoin  300 mg Oral Daily  . predniSONE  10 mg Oral Q48H   Continuous Infusions:    LOS: 6 days    Time spent in minutes: [place], MD [facility] Pager: [email or web address] Password [facility] 12/12/2016, 3:43 PM

## 2016-12-12 NOTE — Progress Notes (Signed)
Labs reviewed in the last 24 hours. His hemoglobin appears stable between 8 and 8.5. His PT has nearly normalized with normal INR.  No further hematology recommendation at this point. I recommend continuied monitoring his hemoglobin the next 24 hours. No clinical signs or symptoms of her expanding hematoma.

## 2016-12-12 NOTE — Progress Notes (Signed)
CSW [name] and [name] attempted to speak to patient about plans for discharge. Per patient, he advised CSW to follow up with his daughter [name]. CSW reached out to daughter to discuss plans. Per daughter, she would like for the patient to come to [place] once medically stable and ready for discharge. Daughter reports [facility] will have beds available tomorrow. Daughter provided CSW with permission to contact facilities. No other concerns were reported at this time.   CSW attempted to get in contact with [facility] [phone]), however received no answer. Unable to leave voice message as phone continued to ring.   CSW attempted to get in contact with [facility] [phone]), however facility representative stated "admissions is not available on weekends".   CSW attempted to get in contact with [facility], however received no answer. CSW left voice message at 2:35pm requesting phone call back.   CSW will continue to follow and provide support to patient and family while in [place], [place] [facility] Ph: [phone]

## 2016-12-13 DIAGNOSIS — L8990 Pressure ulcer of unspecified site, unspecified stage: Secondary | ICD-10-CM | POA: Insufficient documentation

## 2016-12-13 LAB — CBC
HCT: 26 % — ABNORMAL LOW (ref 39.0–52.0)
HEMOGLOBIN: 8.4 g/dL — AB (ref 13.0–17.0)
MCH: 29.2 pg (ref 26.0–34.0)
MCHC: 32.3 g/dL (ref 30.0–36.0)
MCV: 90.3 fL (ref 78.0–100.0)
PLATELETS: 283 10*3/uL (ref 150–400)
RBC: 2.88 MIL/uL — AB (ref 4.22–5.81)
RDW: 18.1 % — ABNORMAL HIGH (ref 11.5–15.5)
WBC: 11 10*3/uL — AB (ref 4.0–10.5)

## 2016-12-13 LAB — PROTIME-INR
INR: 1.34
PROTHROMBIN TIME: 16.5 s — AB (ref 11.4–15.2)

## 2016-12-13 NOTE — Social Work (Signed)
CSW spoke with daughter this day.  CSW advised daughter that [facility] only does outpatient.  CSW indicated that [facility] SN[facility] has offered a bed. Daughter indicated that she would accept bed and she still wanted CSW to f/u with [facility]. CSW will f/u.  Plan is for patient to DC to SNF when medically ready.  4:27pm- CSW called daughter who confirmed that she has accepted the SNF bed at [facility], located at  [place], [address], [place], [place] [address], [phone].  CSW discussed transportation and daughter will pay for transport to SNF. CSW will assist with transportation via [place].  Elissa Hefty, LCSW Clinical Social Worker [phone]

## 2016-12-13 NOTE — Progress Notes (Addendum)
PROGRESS NOTE    [name]   NID:[id number]  DOB: April 17, 1940  DOA: 12/05/2016 PCP: [name], MD   Brief Narrative:  [name] 76 year old male with history of prior stroke, mild dementia, seizure disorder, atrial flutter on Coumadin, history of GI bleed presented after trying to swat at a bug and losing his balance. Xray in ER showed left intertrochanteric fracture.     Subjective: The patient is sitting up in a chair today. Has no complaints.    Assessment & Plan:   Principal Problem:   Closed fracture of femur, intertrochanteric, left, initial encounter  -  intramedullary nail fixation - DVT prophylaxis- SCDs while bleeding (see below) - SNF per PT - recommend osteoporosis screening at discretion of PCP  Active Problems:    Acute blood loss anemia Iron deficiency anemia due to chronic blood loss - INR was therapeutic when he underwent surgery   - 9/21- Hb dropped 1gm today which is 3 days after his surgery- he has required 4 U PRBC so far and the last unit did not cause a rise in his Hb at all- will stop Lovenox (for A-flutter) today- d/w ortho, Dr Percell Miller today that he may have a constant oozing of blood  - will ask heme for opinion as well- transfusing 1 u PRBC-  CT performed later showed a moderate sized left hip hematoma which is likely the cause of the anemia.- Give 2.5 U Vit K to reverse INR- follow closely for more transfusions- I have discussed it with the patient in detail.  - 9/22- INR 1.34 today- Hb 8.4 and stable- ortho to recommend when to resume anticoagulation- I feel we could resume DVT prophylaxis Lovenox today and increase to full dose in 2-3 days if stable.   Hypokalemia and hypomagnesemia - replaced follow PRN  Mild low grade fever(100.3 yesterday) and mild leukocytosis today - WBC steadily going up for past 2 days - no c/o cough, congestion- has a condom cath, no urinary complaints- cont to follow- may be in relation to hematoma   Atrial flutter   - see above regarding Lovenox - is s/p ablation and not on rate controlling agents   Epilepsy - Keppra and Dilantin - Dilantin level supra therapeutic (corrected is 34.4) but daughter states that his neurologist prefers it this way- I have messaged his Neurologist to make her aware - Dr [name] has responded that we should continue Dilantin at this dose unless he has side effects from it.   Takes Prednisone taper for some sore of muscle weakness- per his daughter, his PCP is weaning it- continue at 10 mg daily which is his home dose    DVT prophylaxis: Coumadin Code Status: Full code Family Communication:  Disposition Plan: home Consultants:   ortho Procedures:    left intramedullary nail fixation Antimicrobials:  Anti-infectives    Start     Dose/Rate Route Frequency Ordered Stop   12/07/16 1830  ceFAZolin (ANCEF) IVPB 1 g/50 mL premix     1 g 100 mL/hr over 30 Minutes Intravenous Every 6 hours 12/07/16 1633 12/08/16 0622       Objective: Vitals:   12/12/16 0601 12/12/16 1323 12/12/16 2100 12/13/16 0500  BP: (!) 108/52 (!) 115/58 124/70 110/69  Pulse: 96 94 (!) 101 91  Resp:  19 18 18   Temp: 100 F (37.8 C) 98.4 F (36.9 C) 98.6 F (37 C) 99.7 F (37.6 C)  TempSrc: Oral Oral Oral Oral  SpO2: 97% 98% 100% 99%  Weight:      Height:        Intake/Output Summary (Last 24 hours) at 12/13/16 1048 Last data filed at 12/12/16 1324  Gross per 24 hour  Intake              240 ml  Output                0 ml  Net              240 ml   Filed Weights   12/05/16 2317  Weight: 59 kg (130 lb)    Examination: General exam: Appears comfortable  HEENT: PERRLA, oral mucosa moist, no sclera icterus or thrush Respiratory system: Clear to auscultation. Respiratory effort normal. Cardiovascular system: S1 & S2 heard,  No murmurs  Gastrointestinal system: Abdomen soft, non-tender, nondistended. Normal bowel sound. No organomegaly Central nervous system:  Alert and oriented. No focal neurological deficits. Extremities: No cyanosis, clubbing - continues to haveedema in left thigh/hip  Skin: No rashes or ulcers Psychiatry:  Mood & affect appropriate.     Data Reviewed: I have personally reviewed following labs and imaging studies  CBC:  Recent Labs Lab 12/10/16 2306 12/11/16 0411 12/11/16 1532 12/12/16 0254 12/13/16 0340  WBC 10.0 8.1 7.9 9.0 11.0*  NEUTROABS 7.7  --   --   --   --   HGB 9.0* 8.0* 8.5* 8.3* 8.4*  HCT 26.6* 23.8* 25.3* 25.1* 26.0*  MCV 86.9 86.5 87.5 88.7 90.3  PLT 210 202 230 258 169   Basic Metabolic Panel:  Recent Labs Lab 12/07/16 0319 12/08/16 0554 12/09/16 0314 12/11/16 0411 12/11/16 0805 12/12/16 0254  NA 135 136 138 138  --  138  K 3.6 3.6 3.5 3.0*  --  3.5  CL 103 106 105 104  --  105  CO2 26 20* 28 29  --  29  GLUCOSE 102* 164* 120* 101*  --  91  BUN 11 11 13 9   --  10  CREATININE 0.71 0.80 0.72 0.50*  --  0.56*  CALCIUM 7.6* 7.4* 7.6* 7.8*  --  7.8*  MG  --   --   --   --  1.3*  --    GFR: Estimated Creatinine Clearance: 64.5 mL/min (A) (by C-G formula based on SCr of 0.56 mg/dL (L)). Liver Function Tests:  Recent Labs Lab 12/08/16 0554  AST 27  ALT 12*  ALKPHOS 49  BILITOT 0.7  PROT 4.9*  ALBUMIN 2.7*   No results for input(s): LIPASE, AMYLASE in the last 168 hours. No results for input(s): AMMONIA in the last 168 hours. Coagulation Profile:  Recent Labs Lab 12/10/16 0348 12/10/16 2306 12/11/16 0411 12/12/16 0254 12/13/16 0340  INR 1.97 1.57 1.44 1.23 1.34   Cardiac Enzymes: No results for input(s): CKTOTAL, CKMB, CKMBINDEX, TROPONINI in the last 168 hours. BNP (last 3 results) No results for input(s): PROBNP in the last 8760 hours. HbA1C: No results for input(s): HGBA1C in the last 72 hours. CBG:  Recent Labs Lab 12/11/16 0705 12/11/16 1159 12/11/16 1626 12/11/16 2026 12/12/16 0604  GLUCAP 116* 105* 125* 144* 96   Lipid Profile: No results for  input(s): CHOL, HDL, LDLCALC, TRIG, CHOLHDL, LDLDIRECT in the last 72 hours. Thyroid Function Tests: No results for input(s): TSH, T4TOTAL, FREET4, T3FREE, THYROIDAB in the last 72 hours. Anemia Panel: No results for input(s): VITAMINB12, FOLATE, FERRITIN, TIBC, IRON, RETICCTPCT in the last 72 hours. Urine analysis:    Component Value Date/Time  COLORURINE YELLOW 12/01/2015 1300   APPEARANCEUR CLEAR 12/01/2015 1300   LABSPEC 1.017 12/01/2015 1300   PHURINE 5.5 12/01/2015 1300   GLUCOSEU NEGATIVE 12/01/2015 1300   HGBUR NEGATIVE 12/01/2015 1300   BILIRUBINUR NEGATIVE 12/01/2015 1300   KETONESUR NEGATIVE 12/01/2015 1300   PROTEINUR NEGATIVE 12/01/2015 1300   UROBILINOGEN 0.2 06/02/2011 0300   NITRITE NEGATIVE 12/01/2015 1300   LEUKOCYTESUR NEGATIVE 12/01/2015 1300   Sepsis Labs: @LABRCNTIP (procalcitonin:4,lacticidven:4) ) Recent Results (from the past 240 hour(s))  Surgical pcr screen     Status: None   Collection Time: 12/07/16  3:04 AM  Result Value Ref Range Status   MRSA, PCR NEGATIVE NEGATIVE Final   Staphylococcus aureus NEGATIVE NEGATIVE Final    Comment: (NOTE) The Xpert SA Assay (FDA approved for NASAL specimens in patients 69 years of age and older), is one component of a comprehensive surveillance program. It is not intended to diagnose infection nor to guide or monitor treatment.          Radiology Studies: No results found.    Scheduled Meds: . docusate sodium  100 mg Oral BID  . feeding supplement (ENSURE ENLIVE)  237 mL Oral TID BM  . ferrous sulfate  325 mg Oral TID PC  . levETIRAcetam  1,500 mg Oral BID  . phenytoin  300 mg Oral Daily  . predniSONE  10 mg Oral Q48H   Continuous Infusions:    LOS: 7 days    Time spent in minutes: [place], MD [facility] Pager: [email or web address] Password [facility] 12/13/2016, 10:48 AM

## 2016-12-13 NOTE — Consult Note (Addendum)
[place] Nurse wound consult note Reason for Consult: Consult requested for sacrum/buttocks.  Pt had a fall at home on 9/17 and went to the OR for surgery on 9/18.  Deep tissue injuries frequently do not evolve into skin loss for 7-10 days. Wound type: Middle sacrum/buttocks with darker-colored deep tissue injury on skin; affected area is 4X4cm Pressure Injury POA: No Measurement: Right buttock has evolved into patchy area of skin loss; approx 3X2cm area of stage 2 pressure injury Drainage (amount, consistency, odor) No odor or drainage. Dressing procedure/placement/frequency: Foam dressing to protect and promote healing.  Reviewed etiology and possible evolution of deep tissue injuries with the patient and discussed plan of care, he verbalized understanding. Please re-consult if further assistance is needed.  Thank-you,  Julien Girt MSN, [place], [place], [place], [place]

## 2016-12-13 NOTE — Progress Notes (Signed)
Physical Therapy Treatment Patient Details Name: [name] MRN: [id number] DOB: 06-19-1940 Today's Date: 12/13/2016    History of Present Illness Rakesh Dutko is a 76 y.o. male with a past medical history significant for hx of CVA with residual seizures on Phenytoin/Keppra, Aflutter, dementia and hx of GIB who presents with hip pain after a fall.  Positive for left intertrochanteric hip fracture, now s/p IM nail.     PT Comments    Pt with improved bed mobility and ability to stand however fatigues quickly requiring increased assist with each stand t/o session. Pt remains to have impaired cognition/memory and is unable to follow HEP and has difficulty with sequencing stepping pattern to advance ambulation. Acute PT to con't to follow.    Follow Up Recommendations  SNF     Equipment Recommendations       Recommendations for Other Services       Precautions / Restrictions Precautions Precautions: Fall Precaution Comments: dementia, HOH Restrictions Weight Bearing Restrictions: Yes LLE Weight Bearing: Weight bearing as tolerated    Mobility  Bed Mobility Overal bed mobility: Needs Assistance Bed Mobility: Supine to Sit     Supine to sit: Mod assist     General bed mobility comments: pt requiring max directional verbal and tactile cues. HOB elevated, and initiated trunk elevation with use of UEs and bed rail. with v/c's pt able to bring hips to EOB. PT assisted with L LE management initially  Transfers Overall transfer level: Needs assistance Equipment used: Rolling walker (2 wheeled) (2 person assist with gait belt) Transfers: Sit to/from [employer] Transfers Sit to Stand: Max assist;+2 physical assistance Stand pivot transfers: Max assist;+2 physical assistance       General transfer comment: max directional verbal and tactile cues to complete task, unable to achieve full upright standing. completed std pvt to [facility] and then to chair. pt with  increased difficulty with each sit to stand. recommend to use steady to transfer pt from chair back to bed  Ambulation/Gait             General Gait Details: unable   Stairs            Wheelchair Mobility    Modified Rankin (Stroke Patients Only)       Balance Overall balance assessment: Needs assistance Sitting-balance support: Feet supported Sitting balance-Leahy Scale: Fair     Standing balance support: Bilateral upper extremity supported Standing balance-Leahy Scale: Poor Standing balance comment: dependent on physical assist                            Cognition Arousal/Alertness: Awake/alert Behavior During Therapy: WFL for tasks assessed/performed Overall Cognitive Status: No family/caregiver present to determine baseline cognitive functioning Area of Impairment: Problem solving;Following commands                 Orientation Level: Disoriented to;Time;Situation;Place     Following Commands: Follows one step commands inconsistently;Follows one step commands with increased time     Problem Solving: Slow processing;Difficulty sequencing;Requires verbal cues;Requires tactile cues        Exercises Total Joint Exercises Ankle Circles/Pumps: AROM;Both;10 reps;Supine Quad Sets: AROM;Right;Left;5 reps;Supine Heel Slides: AAROM;Both;AROM;10 reps;Supine    General Comments General comments (skin integrity, edema, etc.): pt assisted to [name], dependent for hygiene. condom cath came over, RN and RN tech aware.      Pertinent Vitals/Pain Pain Assessment: Faces Faces Pain Scale: Hurts even more Pain  Location: L LE pain with mobility Pain Descriptors / Indicators: Grimacing;Operative site guarding Pain Intervention(s): Monitored during session    Home Living                      Prior Function            PT Goals (current goals can now be found in the care plan section) Progress towards PT goals: Progressing toward goals     Frequency    Min 3X/week      PT Plan Current plan remains appropriate    Co-evaluation              AM-PAC PT "6 Clicks" Daily Activity  Outcome Measure  Difficulty turning over in bed (including adjusting bedclothes, sheets and blankets)?: Unable Difficulty moving from lying on back to sitting on the side of the bed? : Unable Difficulty sitting down on and standing up from a chair with arms (e.g., wheelchair, bedside commode, etc,.)?: Unable Help needed moving to and from a bed to chair (including a wheelchair)?: Total Help needed walking in hospital room?: Total Help needed climbing 3-5 steps with a railing? : Total 6 Click Score: 6    End of Session Equipment Utilized During Treatment: Gait belt Activity Tolerance: Patient limited by fatigue;Patient limited by pain Patient left: in chair;with call bell/phone within reach;with chair alarm set Nurse Communication: Mobility status;Need for lift equipment PT Visit Diagnosis: History of falling (Z91.81);Difficulty in walking, not elsewhere classified (R26.2);Pain;Muscle weakness (generalized) (M62.81) Pain - Right/Left: Left Pain - part of body: Hip     Time: 2979-8921 PT Time Calculation (min) (ACUTE ONLY): 28 min  Charges:  $Therapeutic Activity: 23-37 mins                    G Codes:       Kittie Plater, PT, DPT Pager #: [phone] Office #: [phone]    Lynniah Janoski M Analyssa Downs 12/13/2016, 10:01 AM

## 2016-12-14 LAB — CBC
HEMATOCRIT: 28.6 % — AB (ref 39.0–52.0)
HEMOGLOBIN: 9 g/dL — AB (ref 13.0–17.0)
MCH: 28.8 pg (ref 26.0–34.0)
MCHC: 31.5 g/dL (ref 30.0–36.0)
MCV: 91.7 fL (ref 78.0–100.0)
PLATELETS: 292 10*3/uL (ref 150–400)
RBC: 3.12 MIL/uL — AB (ref 4.22–5.81)
RDW: 18.4 % — ABNORMAL HIGH (ref 11.5–15.5)
WBC: 12.1 10*3/uL — AB (ref 4.0–10.5)

## 2016-12-14 LAB — PROTIME-INR
INR: 1.31
Prothrombin Time: 16.2 seconds — ABNORMAL HIGH (ref 11.4–15.2)

## 2016-12-14 LAB — CREATININE, SERUM
Creatinine, Ser: 0.52 mg/dL — ABNORMAL LOW (ref 0.61–1.24)
GFR calc Af Amer: >60 mL/min (ref >60)
GFR calc non Af Amer: >60 mL/min (ref >60)

## 2016-12-14 MED ORDER — WARFARIN SODIUM 5 MG PO TABS
10.0000 mg | ORAL_TABLET | Freq: Once | ORAL | Status: AC
  Administered 2016-12-14: 10 mg via ORAL
  Filled 2016-12-14: qty 2

## 2016-12-14 MED ORDER — ENOXAPARIN SODIUM 40 MG/0.4ML ~~LOC~~ SOLN: 40.0000 mg | SUBCUTANEOUS | Status: DC

## 2016-12-14 MED ORDER — ENOXAPARIN SODIUM 60 MG/0.6ML ~~LOC~~ SOLN
1.0000 mg/kg | Freq: Two times a day (BID) | SUBCUTANEOUS | Status: DC
  Administered 2016-12-14 – 2016-12-15 (×3): 60 mg via SUBCUTANEOUS
  Filled 2016-12-14 (×3): qty 0.6

## 2016-12-14 MED ORDER — WARFARIN - PHYSICIAN DOSING INPATIENT
Freq: Every day | Status: DC
  Administered 2016-12-14: 18:00:00

## 2016-12-14 NOTE — Progress Notes (Addendum)
PROGRESS NOTE    Lomax Poehler Bohorquez   YJE:[id number]  DOB: 12-Oct-1940  DOA: 12/05/2016 PCP: [name], MD   Brief Narrative:  Frances Nickels Fuster 76 year old male with history of prior stroke, mild dementia, seizure disorder, atrial flutter on Coumadin, history of GI bleed presented after trying to swat at a bug and losing his balance. Xray in ER showed left intertrochanteric fracture.   He was noted to develop anemia post op and was given numerous blood transfusions. Ortho did not suspect acute blood loss was the cause of his anemia and therefore, further w/u was done and Heme was consulted to ensure there was no other reason for anemia. A CT was performed as well which noted a moderate sized left thigh hematoma which was likely the cause of the anemia.    Subjective: Evaluated patient this AM. He has no complaints.    Assessment & Plan:   Principal Problem:   Closed fracture of femur, intertrochanteric, left, initial encounter  -  intramedullary nail fixation - DVT prophylaxis- SCDs while bleeding (see below) - SNF per PT - recommend osteoporosis screening at discretion of PCP  Active Problems:    Acute blood loss anemia Iron deficiency anemia due to chronic blood loss - INR was therapeutic when he underwent surgery   - 9/21- Hb dropped 1gm today which is 3 days after his surgery- he has required 4 U PRBC so far and the last unit did not cause a rise in his Hb at all- will stop Lovenox (for A-flutter) today- d/w ortho, Dr Percell Miller today that he may have a constant oozing of blood  - will ask heme for opinion as well- transfusing 1 u PRBC-  CT performed later showed a moderate sized left hip hematoma which is likely the cause of the anemia.- Give 2.5 U Vit K to reverse INR- follow closely for more transfusions- I have discussed it with the patient in detail.  - 9/22- INR 1.34 today- Hb 8.4 and stable - 9/25- I will resume full dose Lovenox today and Coumadin today- follow to  ensure Hb stable prior to d/c to SNF with Lovenox until INR therapeutic   Hypokalemia and hypomagnesemia - replaced follow PRN  Mild low grade fever(100.3 ) and mild leukocytosis   - WBC steadily going up for past 2 days - no c/o cough, congestion- has a condom cath, no urinary complaints- cont to follow- may be in relation to hematoma   Atrial flutter   - see above regarding Lovenox - is s/p ablation and not on rate controlling agents   Epilepsy - Keppra and Dilantin - Dilantin level supra therapeutic (corrected is 34.4) but daughter states that his neurologist prefers it this way- I have messaged his Neurologist to make her aware - Dr [name] has responded that we should continue Dilantin at this dose unless he has side effects from it.  Deep tissue/ pressure injury of sacrum - appreciate wound care f/u -   Takes Prednisone taper for some sore of muscle weakness- per his daughter, his PCP is weaning it- continue at 10 mg daily which is his home dose    DVT prophylaxis: Coumadin Code Status: Full code Family Communication:  Disposition Plan: home Consultants:   ortho Procedures:    left intramedullary nail fixation Antimicrobials:  Anti-infectives    Start     Dose/Rate Route Frequency Ordered Stop   12/07/16 1830  ceFAZolin (ANCEF) IVPB 1 g/50 mL premix     1 g 100  mL/hr over 30 Minutes Intravenous Every 6 hours 12/07/16 1633 12/08/16 0622       Objective: Vitals:   12/13/16 0500 12/13/16 1557 12/13/16 1942 12/14/16 0628  BP: 110/69 121/69 111/60 115/70  Pulse: 91 94 94 87  Resp: 18  18   Temp: 99.7 F (37.6 C) 98.9 F (37.2 C) 99.6 F (37.6 C) 98.7 F (37.1 C)  TempSrc: Oral Oral Oral Axillary  SpO2: 99% 100% 98% 99%  Weight:      Height:        Intake/Output Summary (Last 24 hours) at 12/14/16 1330 Last data filed at 12/14/16 0900  Gross per 24 hour  Intake              360 ml  Output              600 ml  Net             -240 ml   Filed Weights    12/05/16 2317  Weight: 59 kg (130 lb)    Examination: General exam: Appears comfortable  HEENT: PERRLA, oral mucosa moist, no sclera icterus or thrush Respiratory system: Clear to auscultation. Respiratory effort normal. Cardiovascular system: S1 & S2 heard,  No murmurs  Gastrointestinal system: Abdomen soft, non-tender, nondistended. Normal bowel sound. No organomegaly Central nervous system: Alert and oriented. No focal neurological deficits. Extremities: No cyanosis, clubbing - moderate edema left thigh Skin: No rashes or ulcers Psychiatry:  Mood & affect appropriate.      Data Reviewed: I have personally reviewed following labs and imaging studies  CBC:  Recent Labs Lab 12/10/16 2306 12/11/16 0411 12/11/16 1532 12/12/16 0254 12/13/16 0340 12/14/16 0653  WBC 10.0 8.1 7.9 9.0 11.0* 12.1*  NEUTROABS 7.7  --   --   --   --   --   HGB 9.0* 8.0* 8.5* 8.3* 8.4* 9.0*  HCT 26.6* 23.8* 25.3* 25.1* 26.0* 28.6*  MCV 86.9 86.5 87.5 88.7 90.3 91.7  PLT 210 202 230 258 283 622   Basic Metabolic Panel:  Recent Labs Lab 12/08/16 0554 12/09/16 0314 12/11/16 0411 12/11/16 0805 12/12/16 0254 12/14/16 0653  NA 136 138 138  --  138  --   K 3.6 3.5 3.0*  --  3.5  --   CL 106 105 104  --  105  --   CO2 20* 28 29  --  29  --   GLUCOSE 164* 120* 101*  --  91  --   BUN 11 13 9   --  10  --   CREATININE 0.80 0.72 0.50*  --  0.56* 0.52*  CALCIUM 7.4* 7.6* 7.8*  --  7.8*  --   MG  --   --   --  1.3*  --   --    GFR: Estimated Creatinine Clearance: 64.5 mL/min (A) (by C-G formula based on SCr of 0.52 mg/dL (L)). Liver Function Tests:  Recent Labs Lab 12/08/16 0554  AST 27  ALT 12*  ALKPHOS 49  BILITOT 0.7  PROT 4.9*  ALBUMIN 2.7*   No results for input(s): LIPASE, AMYLASE in the last 168 hours. No results for input(s): AMMONIA in the last 168 hours. Coagulation Profile:  Recent Labs Lab 12/10/16 2306 12/11/16 0411 12/12/16 0254 12/13/16 0340 12/14/16 0653  INR  1.57 1.44 1.23 1.34 1.31   Cardiac Enzymes: No results for input(s): CKTOTAL, CKMB, CKMBINDEX, TROPONINI in the last 168 hours. BNP (last 3 results) No results for input(s): PROBNP in  the last 8760 hours. HbA1C: No results for input(s): HGBA1C in the last 72 hours. CBG:  Recent Labs Lab 12/11/16 0705 12/11/16 1159 12/11/16 1626 12/11/16 2026 12/12/16 0604  GLUCAP 116* 105* 125* 144* 96   Lipid Profile: No results for input(s): CHOL, HDL, LDLCALC, TRIG, CHOLHDL, LDLDIRECT in the last 72 hours. Thyroid Function Tests: No results for input(s): TSH, T4TOTAL, FREET4, T3FREE, THYROIDAB in the last 72 hours. Anemia Panel: No results for input(s): VITAMINB12, FOLATE, FERRITIN, TIBC, IRON, RETICCTPCT in the last 72 hours. Urine analysis:    Component Value Date/Time   COLORURINE YELLOW 12/01/2015 1300   APPEARANCEUR CLEAR 12/01/2015 1300   LABSPEC 1.017 12/01/2015 1300   PHURINE 5.5 12/01/2015 1300   GLUCOSEU NEGATIVE 12/01/2015 1300   HGBUR NEGATIVE 12/01/2015 1300   BILIRUBINUR NEGATIVE 12/01/2015 1300   KETONESUR NEGATIVE 12/01/2015 1300   PROTEINUR NEGATIVE 12/01/2015 1300   UROBILINOGEN 0.2 06/02/2011 0300   NITRITE NEGATIVE 12/01/2015 1300   LEUKOCYTESUR NEGATIVE 12/01/2015 1300   Sepsis Labs: @LABRCNTIP (procalcitonin:4,lacticidven:4) ) Recent Results (from the past 240 hour(s))  Surgical pcr screen     Status: None   Collection Time: 12/07/16  3:04 AM  Result Value Ref Range Status   MRSA, PCR NEGATIVE NEGATIVE Final   Staphylococcus aureus NEGATIVE NEGATIVE Final    Comment: (NOTE) The Xpert SA Assay (FDA approved for NASAL specimens in patients 77 years of age and older), is one component of a comprehensive surveillance program. It is not intended to diagnose infection nor to guide or monitor treatment.          Radiology Studies: No results found.    Scheduled Meds: . docusate sodium  100 mg Oral BID  . enoxaparin (LOVENOX) injection  1 mg/kg  Subcutaneous Q12H  . feeding supplement (ENSURE ENLIVE)  237 mL Oral TID BM  . ferrous sulfate  325 mg Oral TID PC  . levETIRAcetam  1,500 mg Oral BID  . phenytoin  300 mg Oral Daily  . predniSONE  10 mg Oral Q48H  . warfarin  10 mg Oral ONCE-1800  . Warfarin - Physician Dosing Inpatient   Does not apply q1800   Continuous Infusions:    LOS: 8 days    Time spent in minutes: 35    Debbe Odea, MD [facility] Pager: [email or web address] Password [email or web address] 12/14/2016, 1:30 PM

## 2016-12-14 NOTE — Progress Notes (Addendum)
   Assessment / Plan: 7 Days Post-Op  S/P Procedure(s) (LRB): INTRAMEDULLARY (IM) NAIL FEMORAL (Left) by Dr. Ernesta Amble. Percell Miller on 12/07/16  Principal Problem:   Closed fracture of femur, intertrochanteric, left, initial encounter [facility]) Active Problems:   Essential hypertension   Atrial flutter (HCC)   Localization-related symptomatic epilepsy and epileptic syndromes with complex partial seizures, not intractable, without status epilepticus ([place])   Iron deficiency anemia due to chronic blood loss   Fall   Protein-calorie malnutrition, severe   Pressure injury of skin  Acute Blood Loss Anemia, Hgb 8.9>>>8.4 on 12/13/16.  Plts wnl.  Stable over past 3 days.  CBC still pending for today.     Left intertrochanteric Femur Fracture S/P IM Nail AFVSN. Pain controlled. Stable from an orthopedic perspective s/p IM nail. Mobilize with therapy when able Incentive Spirometry Apply ice prn  Weight Bearing: Weight Bearing as Tolerated (WBAT) LLE Dressings: PRN Mepilex.  VTE prophylaxis: Restart Coumadin at Heme Onc discretion. SCDs, ambulation Dispo: Per primary.  [place] when stable medically.  Follow up in the office with Dr. Alain Marion in 1-2 weeks.  Please call with questions.  Subjective: Intermittent soreness.    Objective:   VITALS:   Vitals:   12/13/16 0500 12/13/16 1557 12/13/16 1942 12/14/16 0628  BP: 110/69 121/69 111/60 115/70  Pulse: 91 94 94 87  Resp: 18  18   Temp: 99.7 F (37.6 C) 98.9 F (37.2 C) 99.6 F (37.6 C) 98.7 F (37.1 C)  TempSrc: Oral Oral Oral Axillary  SpO2: 99% 100% 98% 99%  Weight:      Height:       CBC Latest Ref Rng & Units 12/13/2016 12/12/2016 12/11/2016  WBC 4.0 - 10.5 K/uL 11.0(H) 9.0 7.9  Hemoglobin 13.0 - 17.0 g/dL 8.4(L) 8.3(L) 8.5(L)  Hematocrit 39.0 - 52.0 % 26.0(L) 25.1(L) 25.3(L)  Platelets 150 - 400 K/uL 283 258 230   BMP Latest Ref Rng & Units 12/12/2016 12/11/2016 12/09/2016  Glucose 65 - 99 mg/dL 91 101(H) 120(H)   BUN 6 - 20 mg/dL 10 9 13   Creatinine 0.61 - 1.24 mg/dL 0.56(L) 0.50(L) 0.72  Sodium 135 - 145 mmol/L 138 138 138  Potassium 3.5 - 5.1 mmol/L 3.5 3.0(L) 3.5  Chloride 101 - 111 mmol/L 105 104 105  CO2 22 - 32 mmol/L 29 29 28   Calcium 8.9 - 10.3 mg/dL 7.8(L) 7.8(L) 7.6(L)   Intake/Output      09/24 0701 - 09/25 0700 09/25 0701 - 09/26 0700   P.O. 240    Total Intake(mL/kg) 240 (4.1)    Net +240          Urine Occurrence 1 x      Physical Exam: General: NAD.  Resting comfortably upright in bed.  Talkative. MSK Feet warm.   Thigh soft. Areas of ecchymosis around hip / incisions appear to be improving. Dorsiflexion/Plantar flexion intact Reports sensation distally. Incision: dressings stable   Charna Elizabeth Ogilvie III, PA-C 12/14/2016, 8:05 AM

## 2016-12-15 DIAGNOSIS — I959 Hypotension, unspecified: Secondary | ICD-10-CM | POA: Diagnosis not present

## 2016-12-15 DIAGNOSIS — K661 Hemoperitoneum: Secondary | ICD-10-CM | POA: Diagnosis not present

## 2016-12-15 DIAGNOSIS — S72142D Displaced intertrochanteric fracture of left femur, subsequent encounter for closed fracture with routine healing: Secondary | ICD-10-CM | POA: Diagnosis not present

## 2016-12-15 DIAGNOSIS — T45515A Adverse effect of anticoagulants, initial encounter: Secondary | ICD-10-CM | POA: Diagnosis present

## 2016-12-15 DIAGNOSIS — S72142A Displaced intertrochanteric fracture of left femur, initial encounter for closed fracture: Secondary | ICD-10-CM | POA: Diagnosis not present

## 2016-12-15 DIAGNOSIS — D509 Iron deficiency anemia, unspecified: Secondary | ICD-10-CM | POA: Diagnosis present

## 2016-12-15 DIAGNOSIS — K5901 Slow transit constipation: Secondary | ICD-10-CM | POA: Diagnosis not present

## 2016-12-15 DIAGNOSIS — M6281 Muscle weakness (generalized): Secondary | ICD-10-CM | POA: Diagnosis not present

## 2016-12-15 DIAGNOSIS — I69351 Hemiplegia and hemiparesis following cerebral infarction affecting right dominant side: Secondary | ICD-10-CM | POA: Diagnosis not present

## 2016-12-15 DIAGNOSIS — R791 Abnormal coagulation profile: Secondary | ICD-10-CM | POA: Diagnosis not present

## 2016-12-15 DIAGNOSIS — K921 Melena: Secondary | ICD-10-CM | POA: Diagnosis not present

## 2016-12-15 DIAGNOSIS — D62 Acute posthemorrhagic anemia: Secondary | ICD-10-CM | POA: Diagnosis not present

## 2016-12-15 DIAGNOSIS — D649 Anemia, unspecified: Secondary | ICD-10-CM | POA: Diagnosis not present

## 2016-12-15 DIAGNOSIS — R198 Other specified symptoms and signs involving the digestive system and abdomen: Secondary | ICD-10-CM | POA: Diagnosis not present

## 2016-12-15 DIAGNOSIS — I9589 Other hypotension: Secondary | ICD-10-CM | POA: Diagnosis not present

## 2016-12-15 DIAGNOSIS — I69341 Monoplegia of lower limb following cerebral infarction affecting right dominant side: Secondary | ICD-10-CM | POA: Diagnosis not present

## 2016-12-15 DIAGNOSIS — D6832 Hemorrhagic disorder due to extrinsic circulating anticoagulants: Secondary | ICD-10-CM | POA: Diagnosis present

## 2016-12-15 DIAGNOSIS — R4589 Other symptoms and signs involving emotional state: Secondary | ICD-10-CM | POA: Diagnosis not present

## 2016-12-15 DIAGNOSIS — R2681 Unsteadiness on feet: Secondary | ICD-10-CM | POA: Diagnosis not present

## 2016-12-15 DIAGNOSIS — G40909 Epilepsy, unspecified, not intractable, without status epilepticus: Secondary | ICD-10-CM | POA: Diagnosis not present

## 2016-12-15 DIAGNOSIS — M25552 Pain in left hip: Secondary | ICD-10-CM | POA: Diagnosis not present

## 2016-12-15 DIAGNOSIS — I4892 Unspecified atrial flutter: Secondary | ICD-10-CM | POA: Diagnosis not present

## 2016-12-15 DIAGNOSIS — I4510 Unspecified right bundle-branch block: Secondary | ICD-10-CM | POA: Diagnosis present

## 2016-12-15 DIAGNOSIS — I951 Orthostatic hypotension: Secondary | ICD-10-CM | POA: Diagnosis not present

## 2016-12-15 DIAGNOSIS — N390 Urinary tract infection, site not specified: Secondary | ICD-10-CM | POA: Diagnosis present

## 2016-12-15 DIAGNOSIS — D6489 Other specified anemias: Secondary | ICD-10-CM | POA: Diagnosis not present

## 2016-12-15 DIAGNOSIS — I10 Essential (primary) hypertension: Secondary | ICD-10-CM | POA: Diagnosis present

## 2016-12-15 DIAGNOSIS — G4089 Other seizures: Secondary | ICD-10-CM | POA: Diagnosis not present

## 2016-12-15 DIAGNOSIS — R109 Unspecified abdominal pain: Secondary | ICD-10-CM | POA: Diagnosis not present

## 2016-12-15 DIAGNOSIS — R1031 Right lower quadrant pain: Secondary | ICD-10-CM | POA: Diagnosis not present

## 2016-12-15 DIAGNOSIS — K922 Gastrointestinal hemorrhage, unspecified: Secondary | ICD-10-CM | POA: Diagnosis not present

## 2016-12-15 DIAGNOSIS — Z7901 Long term (current) use of anticoagulants: Secondary | ICD-10-CM | POA: Diagnosis not present

## 2016-12-15 LAB — CBC
HEMATOCRIT: 26.1 % — AB (ref 39.0–52.0)
Hemoglobin: 8.5 g/dL — ABNORMAL LOW (ref 13.0–17.0)
MCH: 29.9 pg (ref 26.0–34.0)
MCHC: 32.6 g/dL (ref 30.0–36.0)
MCV: 91.9 fL (ref 78.0–100.0)
Platelets: 275 10*3/uL (ref 150–400)
RBC: 2.84 MIL/uL — ABNORMAL LOW (ref 4.22–5.81)
RDW: 18.9 % — AB (ref 11.5–15.5)
WBC: 8.7 10*3/uL (ref 4.0–10.5)

## 2016-12-15 LAB — BASIC METABOLIC PANEL
Anion gap: 7 (ref 5–15)
BUN: 12 mg/dL (ref 6–20)
CALCIUM: 7.9 mg/dL — AB (ref 8.9–10.3)
CO2: 29 mmol/L (ref 22–32)
Chloride: 102 mmol/L (ref 101–111)
Creatinine, Ser: 0.54 mg/dL — ABNORMAL LOW (ref 0.61–1.24)
GFR calc Af Amer: >60 mL/min (ref >60)
GFR calc non Af Amer: >60 mL/min (ref >60)
GLUCOSE: 95 mg/dL (ref 65–99)
Potassium: 3 mmol/L — ABNORMAL LOW (ref 3.5–5.1)
Sodium: 138 mmol/L (ref 135–145)

## 2016-12-15 LAB — PROTIME-INR
INR: 1.34
Prothrombin Time: 16.5 seconds — ABNORMAL HIGH (ref 11.4–15.2)

## 2016-12-15 LAB — MAGNESIUM: Magnesium: 1.5 mg/dL — ABNORMAL LOW (ref 1.7–2.4)

## 2016-12-15 MED ORDER — ADULT MULTIVITAMIN W/MINERALS CH
1.0000 | ORAL_TABLET | Freq: Every day | ORAL | Status: DC
  Administered 2016-12-15: 1 via ORAL

## 2016-12-15 MED ORDER — ENOXAPARIN SODIUM 60 MG/0.6ML ~~LOC~~ SOLN: 1.0000 mg/kg | Freq: Two times a day (BID) | SUBCUTANEOUS | 0 refills | Status: DC

## 2016-12-15 MED ORDER — MAGNESIUM OXIDE 400 (241.3 MG) MG PO TABS
400.0000 mg | ORAL_TABLET | Freq: Once | ORAL | Status: AC
  Administered 2016-12-15: 400 mg via ORAL
  Filled 2016-12-15: qty 1

## 2016-12-15 MED ORDER — FERROUS SULFATE 325 (65 FE) MG PO TABS: 325.0000 mg | ORAL_TABLET | Freq: Three times a day (TID) | ORAL | 0 refills | Status: AC

## 2016-12-15 MED ORDER — POTASSIUM CHLORIDE CRYS ER 20 MEQ PO TBCR
40.0000 meq | EXTENDED_RELEASE_TABLET | Freq: Once | ORAL | Status: AC
  Administered 2016-12-15: 40 meq via ORAL
  Filled 2016-12-15: qty 2

## 2016-12-15 NOTE — Clinical Social Work Placement (Signed)
   CLINICAL SOCIAL WORK PLACEMENT  NOTE  Date:  12/15/2016  Patient Details  Name: [name] MRN: [id number] Date of Birth: 1940/05/04  Clinical Social Work is seeking post-discharge placement for this patient at the [place] level of care (*CSW will initial, date and re-position this form in  chart as items are completed):  Yes   Patient/family provided with [facility] Work Department's list of facilities offering this level of care within the geographic area requested by the patient (or if unable, by the patient's family).  Yes   Patient/family informed of their freedom to choose among providers that offer the needed level of care, that participate in Medicare, Medicaid or managed care program needed by the patient, have an available bed and are willing to accept the patient.  Yes   Patient/family informed of [facility]'s ownership interest in [facility] and [facility], as well as of the fact that they are under no obligation to receive care at these facilities.  PASRR submitted to EDS on       PASRR number received on       Existing PASRR number confirmed on 12/08/16     FL2 transmitted to all facilities in geographic area requested by pt/family on       FL2 transmitted to all facilities within larger geographic area on 12/09/16     Patient informed that his/her managed care company has contracts with or will negotiate with certain facilities, including the following:        Yes   Patient/family informed of bed offers received.  Patient chooses bed at  ([facility], [facility], [place])     Physician recommends and patient chooses bed at      Patient to be transferred to  ([facility]) on  .  Patient to be transferred to facility by [facility]     Patient family notified on 12/15/16 of transfer.  Name of family member notified:  daughter [name]     PHYSICIAN Please prepare prescriptions,  Please prepare priority discharge summary, including medications     Additional Comment:    _______________________________________________ Normajean Baxter, LCSW 12/15/2016, 11:24 AM

## 2016-12-15 NOTE — Social Work (Signed)
CSW f/u on DC plans to SNF bed at [facility], located at  [place], [address], [place], [place] [address], [phone].  CSW confirmed SNF placement with Marg[facility]([phone]) in admissions.  CSW called [facility] to get a cost of transport to SNF $1,339.53.  CSW called daughter and advised daughter of cost. CSW advised that when DC summary is available CSW will call her to let her know and then she can decide if she will use [facility] or transport on her own. Daughter agreed to same.  CSW will continue to follow.  Elissa Hefty, LCSW Clinical Social Worker [phone]

## 2016-12-15 NOTE — Progress Notes (Signed)
PT Cancellation Note  Patient Details Name: [name] MRN: [id number] DOB: 05/06/1940   Cancelled Treatment:    Reason Eval/Treat Not Completed: Other (comment);Patient declined, no reason specified (Pt refused likley due to cognition.  )   Rik Wadel Eli Hose 12/15/2016, 2:16 PM  Governor Rooks, PTA pager [phone]

## 2016-12-15 NOTE — Progress Notes (Signed)
Nutrition Follow-up  DOCUMENTATION CODES:   Severe malnutrition in context of chronic illness, Underweight  INTERVENTION:   Provide Ensure Enlive po TID, each supplement provides 350 kcal and 20 grams of protein.  Add MVI  NUTRITION DIAGNOSIS:   Malnutrition (severe) related to chronic illness (dementia) as evidenced by severe depletion of body fat, severe depletion of muscle mass; ongoing  GOAL:   Patient will meet greater than or equal to 90% of their needs; progressing  MONITOR:   PO intake, Supplement acceptance, Labs, Weight trends, Skin, I & O's  ASSESSMENT:   76 year old male with history of prior stroke, mild dementia, seizure disorder, atrial flutter, history of GI bleed presented after a fall sustaining hip pain. Found to have left intertrochanteric hip fracture.  PROCEDURE (9/18):  INTRAMEDULLARY (IM) NAIL FEMORAL  Pt feeling better; appetite improving. Pt is now eating 50-100% of his meals and drinking Ensure. Per chart, no new weight since admit; RD will request new weight. Pt noted to have anemia; CT performed and pt found to have left hip hematoma which is thought to be the cause of the anemia. Pt also with Iron deficiency; iron was 8(L) on 9/7. Pt receiving ferrous sulfate. Pt with two new pressure injuries; will add MVI. Pt seen by WOC. Continue to encourage intake of meals and supplements; adequate protein intake will be vital to pt's healing. Pt with hypokalemia and hypomagnesemia; monitor and supplement as needed per MD discretion. Pt to discharge to SNF when able.   Medications reviewed and include: colace, lovenox, ferrous sulfate, prednisone, warfarin, miralax  Labs reviewed: K 3.0(L), creat 0.54(L), Ca 7.9(L), Mg 1.5(L) Hgb 8.5(L), Hct 26.1(L)  Diet Order:  DIET SOFT Room service appropriate? Yes; Fluid consistency: Thin  Skin:  Wound (see comment) (deep tissue injury sacrum, Stage II buttocks )  Last BM:  Unknown  Height:   Ht Readings from Last 1  Encounters:  12/05/16 5\' 11"  (1.803 m)    Weight:   Wt Readings from Last 1 Encounters:  12/05/16 130 lb (59 kg)    Ideal Body Weight:  78.18 kg  BMI:  Body mass index is 18.13 kg/m.  Estimated Nutritional Needs:   Kcal:  1800-2000  Protein:  80-90 grams  Fluid:  1.8 - 2 L/day  EDUCATION NEEDS:   No education needs identified at this time  Koleen Distance MS, RD, LDN Pager #- [phone] After Hours Pager: [phone]

## 2016-12-15 NOTE — Discharge Summary (Signed)
Physician Discharge Summary  [name] NIO:[id number] DOB: 1940/12/25 DOA: 12/05/2016  PCP: [name], MD  Admit date: 12/05/2016 Discharge date: 12/15/2016  Time spent: > 35 minutes  Recommendations for Outpatient Follow-up:  Monitor hgb levels Discontinue lovenox when INR is therapeutic  (2-3)  Discharge Diagnoses:  Principal Problem:   Closed fracture of femur, intertrochanteric, left, initial encounter [facility]) Active Problems:   Essential hypertension   Atrial flutter (HCC)   Localization-related symptomatic epilepsy and epileptic syndromes with complex partial seizures, not intractable, without status epilepticus ([place])   Iron deficiency anemia due to chronic blood loss   Fall   Protein-calorie malnutrition, severe   Pressure injury of skin   Discharge Condition: stable  Diet recommendation: soft diet.  Filed Weights   12/05/16 2317  Weight: 59 kg (130 lb)    History of present illness:  [name] 76 year old male with history of prior stroke, mild dementia, seizure disorder, atrial flutter on Coumadin, history of GI bleed presented after trying to swat at a bug and losing his balance. Xray in ER showed left intertrochanteric fracture.   He was noted to develop anemia post op and was given numerous blood transfusions. Ortho did not suspect acute blood loss was the cause of his anemia and therefore, further w/u was done and Heme was consulted to ensure there was no other reason for anemia. A CT was performed as well which noted a moderate sized left thigh hematoma which was likely the cause of the anemia.   Hospital Course:     Closed fracture of femur, intertrochanteric, left, initial encounter  -  intramedullary nail fixation - Anticoagulation per ortho. Lovenox to coumadin. D/c lovenox once INR therapeutic. - SNF per PT - recommend osteoporosis screening at discretion of PCP  Active Problems:    Acute blood loss anemia Iron deficiency  anemia due to chronic blood loss - INR was therapeutic when he underwent surgery   - 9/21- Hb dropped 1gm today which is 3 days after his surgery- he has required 4 U PRBC so far and the last unit did not cause a rise in his Hb at all- will stop Lovenox (for A-flutter) today- d/w ortho, Dr Percell Miller today that he may have a constant oozing of blood  - as such hematologist assisted with decision making. - lovenox and coumadin restarted and hgb levels stable. - recommend continued monitoring of hemoglobin - pt did require FFP and vitamin K this hospitalization - discharge on iron supplementation.  Hypokalemia and hypomagnesemia - replaced K  Orally prior to discharge. - reassess magnesium levels at facility and replace still low. Will replace orally prior to discharge.  Mild low grade fever and mild leukocytosis   - WBC wnl and no fever reported.   Atrial flutter   - see above regarding Lovenox - is s/p ablation and not on rate controlling agents   Epilepsy - Keppra and Dilantin - Dilantin level supra therapeutic (corrected is 34.4) but daughter states that his neurologist prefers it this way- I have messaged his Neurologist to make her aware - Dr [name] has responded that we should continue Dilantin at this dose unless he has side effects from it.  Deep tissue/ pressure injury of sacrum - appreciate wound care f/u -   Takes Prednisone taper for some sore of muscle weakness- per his daughter, his PCP is weaning it- continue at 10 mg daily which is his home dose  Procedures:  As listed above. See ortho notes  Consultations:  OrthoEdmonia Lynch  Discharge Exam: Vitals:   12/14/16 2100 12/15/16 0358  BP: 119/62 117/61  Pulse: 99 86  Resp:  16  Temp: 99.1 F (37.3 C) 98.7 F (37.1 C)  SpO2: 99% 98%    General: Pt in nad, alert and awake Cardiovascular: rrr, no rubs Respiratory: no increased wob, no wheezes  Discharge Instructions   Discharge Instructions    Call  MD for:  severe uncontrolled pain    Complete by:  As directed    Call MD for:  temperature >100.4    Complete by:  As directed    Diet - low sodium heart healthy    Complete by:  As directed    Discharge instructions    Complete by:  As directed    Please ensure that SNF physician monitors INR and discontinues lovenox once INR is therapeutic. If patient develops worsening hematoma take patient to ER for further evaluation.  Also reassess cbc in the next 1 week.   Increase activity slowly    Complete by:  As directed      Current Discharge Medication List    START taking these medications   Details  enoxaparin (LOVENOX) 60 MG/0.6ML injection Inject 0.6 mLs (60 mg total) into the skin every 12 (twelve) hours. Qty: 6 mL, Refills: 0    ferrous sulfate 325 (65 FE) MG tablet Take 1 tablet (325 mg total) by mouth 3 (three) times daily with meals. Qty: 60 tablet, Refills: 0    HYDROcodone-acetaminophen (NORCO) 5-325 MG tablet Take 1 tablet by mouth every 6 (six) hours as needed for moderate pain. Qty: 30 tablet, Refills: 0      CONTINUE these medications which have NOT CHANGED   Details  levETIRAcetam (KEPPRA) 500 MG tablet Take 3 tablets twice a day Qty: 180 tablet, Refills: 11   Associated Diagnoses: Localization-related symptomatic epilepsy and epileptic syndromes with complex partial seizures, not intractable, without status epilepticus (HCC)    phenytoin (DILANTIN) 100 MG ER capsule Take 3 capsules (300 mg total) by mouth daily. Qty: 90 capsule, Refills: 11   Associated Diagnoses: Localization-related symptomatic epilepsy and epileptic syndromes with complex partial seizures, not intractable, without status epilepticus (HCC)    predniSONE (DELTASONE) 10 MG tablet Take 10-30 mg by mouth See admin instructions. Alternate taking with 10mg  and 30mg  Refills: 2    warfarin (COUMADIN) 2.5 MG tablet TAKE 1 OR 2 TABLETS BY MOUTH DAILY AS DIRECTED BY COUMADIN CLINIC Qty: 50 tablet,  Refills: 3       No Known Allergies Follow-up Information    Renette Butters, MD Follow up in 2 week(s).   Specialty:  Orthopedic Surgery Contact information: [address] [place] [address] [phone]            The results of significant diagnostics from this hospitalization (including imaging, microbiology, ancillary and laboratory) are listed below for reference.    Significant Diagnostic Studies: Ct Abdomen Pelvis Wo Contrast  Result Date: 12/10/2016 CLINICAL DATA:  Unexplained anemia. Clinical concern for retroperitoneal hemorrhage. EXAM: CT ABDOMEN AND PELVIS WITHOUT CONTRAST TECHNIQUE: Multidetector CT imaging of the abdomen and pelvis was performed following the standard protocol without IV contrast. COMPARISON:  10/17/2015. FINDINGS: Lower chest: Mild left lower lobe atelectasis. Minimal pericardial fluid measuring 5 mm in maximum thickness, without significant change. Hepatobiliary: Multiple calcified granulomata throughout the liver again demonstrated. Contracted gallbladder. Pancreas: Diffuse pancreatic atrophy. Spleen: Multiple calcified granulomata Adrenals/Urinary Tract: 3 mm lower pole calculus.  Normal appearing right kidney, ureters and urinary bladder. No hydronephrosis. Normal appearing adrenal glands. Stomach/Bowel: Prominent stool and gas in the colon. Fluid in the rectum. Unremarkable stomach and small bowel. No evidence of appendicitis. Vascular/Lymphatic: Mild atheromatous arterial calcifications without aneurysm. No enlarged lymph nodes. Reproductive: Prostate is unremarkable. Other: Left iliacus and iliopsoas hematoma with moderate enlargement of those muscles. Diffuse subcutaneous edema. Small amount of free peritoneal fluid in the presacral region. Small bilateral inguinal hernias containing fat. Musculoskeletal: Hardware fixation of a comminuted proximal left femur fracture. There is some hemorrhage in the surrounding soft tissues.  Extensive lumbar and lower thoracic spine degenerative changes, including changes of DISH. IMPRESSION: 1. Left iliacus and iliopsoas hematoma. 2. Hardware fixation of a comminuted proximal left femur fracture with mild surrounding hemorrhage and edema. 3. Prominent stool and gas in the colon and fluid in the rectum. 4. Diffuse subcutaneous edema. 5. Minimal ascites. 6. Stable minimal pericardial effusion. 7. Stable small, nonobstructing lower pole left renal calculus. Electronically Signed   By: Claudie Revering [name]   On: 12/10/2016 17:46   Dg Chest 1 View  Result Date: 12/06/2016 CLINICAL DATA:  Pain after a fall.  Left hip fracture. EXAM: CHEST 1 VIEW COMPARISON:  12/08/2015 FINDINGS: Normal heart size and pulmonary vascularity. Diffuse emphysematous changes in the lungs. No airspace disease or consolidation. No blunting of costophrenic angles. No pneumothorax. Mediastinal contours appear intact. Degenerative changes in the shoulders. IMPRESSION: Emphysematous changes in the lungs. No evidence of active pulmonary disease. Electronically Signed   By: Lucienne Capers [name]   On: 12/06/2016 01:23   Ct Head Wo Contrast  Result Date: 12/06/2016 CLINICAL DATA:  76 year old hypertensive male with sudden onset of man status changes. Initial encounter. EXAM: CT HEAD WITHOUT CONTRAST TECHNIQUE: Contiguous axial images were obtained from the base of the skull through the vertex without intravenous contrast. COMPARISON:  09/19/2012 CT. FINDINGS: Brain: No intracranial hemorrhage or CT evidence of large acute infarct. Remote left temporal lobe infarct with encephalomalacia. Chronic microvascular changes. Global atrophy without hydrocephalus. Right frontal region 1.2 x 1.2 x 0.4 cm extra-axial mass with partial calcifications suggestive of small meningioma without surrounding vasogenic edema or mass effect. Vascular: Vascular calcifications Skull: No acute abnormality Sinuses/Orbits: No acute orbital abnormality. Mucosal  thickening maxillary and ethmoid sinus air cells. Other: Mastoid air cells and middle ear cavities are clear. IMPRESSION: No intracranial hemorrhage or CT evidence of large acute infarct. Remote left temporal lobe infarct. Chronic microvascular changes.  Global atrophy. Right frontal region 1.2 x 1.2 x 0.4 cm extra-axial mass with partial calcifications suggestive of small meningioma without surrounding vasogenic edema or mass effect. This is not well delineated on prior exams. Mucosal thickening maxillary and ethmoid sinus air cells. Electronically Signed   By: Genia Del [name]   On: 12/06/2016 12:33   Dg C-arm 1-60 Min  Result Date: 12/07/2016 CLINICAL DATA:  Intramedullary rod fixation. EXAM: DG C-ARM 61-120 MIN; LEFT FEMUR 2 VIEWS FLUOROSCOPY TIME:  1 minutes 20 seconds. COMPARISON:  Radiographs of December 06, 2016. FINDINGS: Four intraoperative fluoroscopic images were obtained of the left femur. These demonstrate the patient be status post surgical internal fixation of intertrochanteric fracture proximal left femur with intramedullary rod fixation. IMPRESSION: Status post surgical internal fixation of proximal left femur intertrochanteric fracture. Electronically Signed   By: Marijo Conception, [name]   On: 12/07/2016 16:05   Dg Hip Port Unilat With Pelvis 1v Left  Result Date: 12/07/2016 CLINICAL DATA:  Left hip intertrochanteric  fracture, status post operative fixation EXAM: DG HIP (WITH OR WITHOUT PELVIS) 1V PORT LEFT COMPARISON:  12/06/2016 FINDINGS: Left femur intramedullary rod and screw fixation reduces the acute comminuted left hip intertrochanteric fracture. Improved alignment. Bones are osteopenic. Degenerative changes of both hips with joint space loss, sclerosis and osteophytes. Lumbosacral degenerative change noted. Bony pelvis intact. Expected postoperative changes of the soft tissues. IMPRESSION: Status post ORIF of the left hip intertrochanteric fracture with improved alignment. No  complicating feature. Electronically Signed   By: Jerilynn Mages.  Shick [name]   On: 12/07/2016 16:46   Dg Hip Unilat With Pelvis 2-3 Views Left  Result Date: 12/06/2016 CLINICAL DATA:  Pain after a fall. EXAM: DG HIP (WITH OR WITHOUT PELVIS) 2-3V LEFT COMPARISON:  None. FINDINGS: Diffuse bone demineralization. There is an acute comminuted fracture of the inter trochanteric left hip with focal extension to the sub trochanteric region. There is associated varus angulation of the left proximal femur. Displaced lesser trochanteric fragment. Degenerative changes in both hips and in the lower lumbar spine. Pelvis appears intact. SI joints and symphysis pubis are not displaced. Calcified phleboliths in the pelvis. IMPRESSION: Acute comminuted fracture of the inter trochanteric left hip with varus angulation. Electronically Signed   By: Lucienne Capers [name]   On: 12/06/2016 01:22   Dg Femur Min 2 Views Left  Result Date: 12/07/2016 CLINICAL DATA:  Intramedullary rod fixation. EXAM: DG C-ARM 61-120 MIN; LEFT FEMUR 2 VIEWS FLUOROSCOPY TIME:  1 minutes 20 seconds. COMPARISON:  Radiographs of December 06, 2016. FINDINGS: Four intraoperative fluoroscopic images were obtained of the left femur. These demonstrate the patient be status post surgical internal fixation of intertrochanteric fracture proximal left femur with intramedullary rod fixation. IMPRESSION: Status post surgical internal fixation of proximal left femur intertrochanteric fracture. Electronically Signed   By: Marijo Conception, [name]   On: 12/07/2016 16:05    Microbiology: Recent Results (from the past 240 hour(s))  Surgical pcr screen     Status: None   Collection Time: 12/07/16  3:04 AM  Result Value Ref Range Status   MRSA, PCR NEGATIVE NEGATIVE Final   Staphylococcus aureus NEGATIVE NEGATIVE Final    Comment: (NOTE) The Xpert SA Assay (FDA approved for NASAL specimens in patients 62 years of age and older), is one component of a  comprehensive surveillance program. It is not intended to diagnose infection nor to guide or monitor treatment.      Labs: Basic Metabolic Panel:  Recent Labs Lab 12/09/16 0314 12/11/16 0411 12/11/16 0805 12/12/16 0254 12/14/16 0653 12/15/16 0453  NA 138 138  --  138  --  138  K 3.5 3.0*  --  3.5  --  3.0*  CL 105 104  --  105  --  102  CO2 28 29  --  29  --  29  GLUCOSE 120* 101*  --  91  --  95  BUN 13 9  --  10  --  12  CREATININE 0.72 0.50*  --  0.56* 0.52* 0.54*  CALCIUM 7.6* 7.8*  --  7.8*  --  7.9*  MG  --   --  1.3*  --   --  1.5*   Liver Function Tests: No results for input(s): AST, ALT, ALKPHOS, BILITOT, PROT, ALBUMIN in the last 168 hours. No results for input(s): LIPASE, AMYLASE in the last 168 hours. No results for input(s): AMMONIA in the last 168 hours. CBC:  Recent Labs Lab 12/10/16 2306  12/11/16 1532 12/12/16  1683 12/13/16 0340 12/14/16 0653 12/15/16 0453  WBC 10.0  < > 7.9 9.0 11.0* 12.1* 8.7  NEUTROABS 7.7  --   --   --   --   --   --   HGB 9.0*  < > 8.5* 8.3* 8.4* 9.0* 8.5*  HCT 26.6*  < > 25.3* 25.1* 26.0* 28.6* 26.1*  MCV 86.9  < > 87.5 88.7 90.3 91.7 91.9  PLT 210  < > 230 258 283 292 275  < > = values in this interval not displayed. Cardiac Enzymes: No results for input(s): CKTOTAL, CKMB, CKMBINDEX, TROPONINI in the last 168 hours. BNP: BNP (last 3 results) No results for input(s): BNP in the last 8760 hours.  ProBNP (last 3 results) No results for input(s): PROBNP in the last 8760 hours.  CBG:  Recent Labs Lab 12/11/16 0705 12/11/16 1159 12/11/16 1626 12/11/16 2026 12/12/16 0604  GLUCAP 116* 105* 125* 144* 96     Signed:  Velvet Bathe MD.  [facility] 12/15/2016, 1:35 PM

## 2016-12-15 NOTE — Progress Notes (Signed)
Patient will DC to: [facility] Anticipated DC date: 12/15/16 Family notified:Daughter Transport by: Ernst Spell   Per MD patient ready for DC to SNF. RN, patient, patient's family, and facility notified of DC. Discharge Summary sent to facility. RN given number for report [phone])  CSW signing off.  Cedric Fishman, [place] Social Worker [phone]

## 2016-12-16 DIAGNOSIS — D6489 Other specified anemias: Secondary | ICD-10-CM | POA: Diagnosis not present

## 2016-12-16 DIAGNOSIS — G4089 Other seizures: Secondary | ICD-10-CM | POA: Diagnosis not present

## 2016-12-16 DIAGNOSIS — S72142A Displaced intertrochanteric fracture of left femur, initial encounter for closed fracture: Secondary | ICD-10-CM | POA: Diagnosis not present

## 2016-12-16 DIAGNOSIS — K5901 Slow transit constipation: Secondary | ICD-10-CM | POA: Diagnosis not present

## 2016-12-20 DIAGNOSIS — K661 Hemoperitoneum: Secondary | ICD-10-CM | POA: Diagnosis present

## 2016-12-20 DIAGNOSIS — I69351 Hemiplegia and hemiparesis following cerebral infarction affecting right dominant side: Secondary | ICD-10-CM | POA: Diagnosis not present

## 2016-12-20 DIAGNOSIS — I9589 Other hypotension: Secondary | ICD-10-CM | POA: Diagnosis not present

## 2016-12-20 DIAGNOSIS — N390 Urinary tract infection, site not specified: Secondary | ICD-10-CM | POA: Diagnosis present

## 2016-12-20 DIAGNOSIS — K921 Melena: Secondary | ICD-10-CM | POA: Diagnosis not present

## 2016-12-20 DIAGNOSIS — I69341 Monoplegia of lower limb following cerebral infarction affecting right dominant side: Secondary | ICD-10-CM | POA: Diagnosis not present

## 2016-12-20 DIAGNOSIS — D6832 Hemorrhagic disorder due to extrinsic circulating anticoagulants: Secondary | ICD-10-CM | POA: Diagnosis present

## 2016-12-20 DIAGNOSIS — I959 Hypotension, unspecified: Secondary | ICD-10-CM | POA: Diagnosis not present

## 2016-12-20 DIAGNOSIS — I4510 Unspecified right bundle-branch block: Secondary | ICD-10-CM | POA: Diagnosis present

## 2016-12-20 DIAGNOSIS — Z7901 Long term (current) use of anticoagulants: Secondary | ICD-10-CM | POA: Diagnosis not present

## 2016-12-20 DIAGNOSIS — R1031 Right lower quadrant pain: Secondary | ICD-10-CM | POA: Diagnosis not present

## 2016-12-20 DIAGNOSIS — M6281 Muscle weakness (generalized): Secondary | ICD-10-CM | POA: Diagnosis not present

## 2016-12-20 DIAGNOSIS — R2681 Unsteadiness on feet: Secondary | ICD-10-CM | POA: Diagnosis not present

## 2016-12-20 DIAGNOSIS — I10 Essential (primary) hypertension: Secondary | ICD-10-CM | POA: Diagnosis present

## 2016-12-20 DIAGNOSIS — K922 Gastrointestinal hemorrhage, unspecified: Secondary | ICD-10-CM | POA: Diagnosis not present

## 2016-12-20 DIAGNOSIS — R4589 Other symptoms and signs involving emotional state: Secondary | ICD-10-CM | POA: Diagnosis not present

## 2016-12-20 DIAGNOSIS — T45515A Adverse effect of anticoagulants, initial encounter: Secondary | ICD-10-CM | POA: Diagnosis present

## 2016-12-20 DIAGNOSIS — M25552 Pain in left hip: Secondary | ICD-10-CM | POA: Diagnosis not present

## 2016-12-20 DIAGNOSIS — I4892 Unspecified atrial flutter: Secondary | ICD-10-CM | POA: Diagnosis present

## 2016-12-20 DIAGNOSIS — R109 Unspecified abdominal pain: Secondary | ICD-10-CM | POA: Diagnosis not present

## 2016-12-20 DIAGNOSIS — D509 Iron deficiency anemia, unspecified: Secondary | ICD-10-CM | POA: Diagnosis present

## 2016-12-20 DIAGNOSIS — D649 Anemia, unspecified: Secondary | ICD-10-CM | POA: Diagnosis not present

## 2016-12-20 DIAGNOSIS — D62 Acute posthemorrhagic anemia: Secondary | ICD-10-CM | POA: Diagnosis present

## 2016-12-20 DIAGNOSIS — S72142D Displaced intertrochanteric fracture of left femur, subsequent encounter for closed fracture with routine healing: Secondary | ICD-10-CM | POA: Diagnosis not present

## 2016-12-20 DIAGNOSIS — D6489 Other specified anemias: Secondary | ICD-10-CM | POA: Diagnosis not present

## 2016-12-20 DIAGNOSIS — R791 Abnormal coagulation profile: Secondary | ICD-10-CM | POA: Diagnosis not present

## 2016-12-20 DIAGNOSIS — I951 Orthostatic hypotension: Secondary | ICD-10-CM | POA: Diagnosis not present

## 2016-12-20 DIAGNOSIS — R198 Other specified symptoms and signs involving the digestive system and abdomen: Secondary | ICD-10-CM | POA: Diagnosis not present

## 2016-12-20 DIAGNOSIS — G40909 Epilepsy, unspecified, not intractable, without status epilepticus: Secondary | ICD-10-CM | POA: Diagnosis present

## 2016-12-21 DIAGNOSIS — R4589 Other symptoms and signs involving emotional state: Secondary | ICD-10-CM | POA: Diagnosis not present

## 2016-12-21 DIAGNOSIS — M25552 Pain in left hip: Secondary | ICD-10-CM | POA: Diagnosis not present

## 2016-12-21 DIAGNOSIS — S72142D Displaced intertrochanteric fracture of left femur, subsequent encounter for closed fracture with routine healing: Secondary | ICD-10-CM | POA: Diagnosis not present

## 2016-12-29 DIAGNOSIS — D6489 Other specified anemias: Secondary | ICD-10-CM | POA: Diagnosis not present

## 2016-12-29 DIAGNOSIS — R198 Other specified symptoms and signs involving the digestive system and abdomen: Secondary | ICD-10-CM | POA: Diagnosis not present

## 2016-12-29 DIAGNOSIS — I9589 Other hypotension: Secondary | ICD-10-CM | POA: Diagnosis not present

## 2016-12-30 DIAGNOSIS — R198 Other specified symptoms and signs involving the digestive system and abdomen: Secondary | ICD-10-CM | POA: Diagnosis not present

## 2016-12-30 DIAGNOSIS — D6489 Other specified anemias: Secondary | ICD-10-CM | POA: Diagnosis not present

## 2016-12-30 DIAGNOSIS — M25552 Pain in left hip: Secondary | ICD-10-CM | POA: Diagnosis not present

## 2016-12-30 DIAGNOSIS — I9589 Other hypotension: Secondary | ICD-10-CM | POA: Diagnosis not present

## 2016-12-31 DIAGNOSIS — I951 Orthostatic hypotension: Secondary | ICD-10-CM | POA: Diagnosis not present

## 2017-01-03 DIAGNOSIS — D6489 Other specified anemias: Secondary | ICD-10-CM | POA: Diagnosis not present

## 2017-01-03 DIAGNOSIS — I951 Orthostatic hypotension: Secondary | ICD-10-CM | POA: Diagnosis not present

## 2017-01-04 DIAGNOSIS — I951 Orthostatic hypotension: Secondary | ICD-10-CM | POA: Diagnosis not present

## 2017-01-06 DIAGNOSIS — I69341 Monoplegia of lower limb following cerebral infarction affecting right dominant side: Secondary | ICD-10-CM | POA: Diagnosis not present

## 2017-01-06 DIAGNOSIS — R2681 Unsteadiness on feet: Secondary | ICD-10-CM | POA: Diagnosis not present

## 2017-01-06 DIAGNOSIS — D509 Iron deficiency anemia, unspecified: Secondary | ICD-10-CM | POA: Diagnosis present

## 2017-01-06 DIAGNOSIS — S3681XA Injury of peritoneum, initial encounter: Secondary | ICD-10-CM | POA: Diagnosis not present

## 2017-01-06 DIAGNOSIS — K921 Melena: Secondary | ICD-10-CM | POA: Diagnosis not present

## 2017-01-06 DIAGNOSIS — R109 Unspecified abdominal pain: Secondary | ICD-10-CM | POA: Diagnosis not present

## 2017-01-06 DIAGNOSIS — I10 Essential (primary) hypertension: Secondary | ICD-10-CM | POA: Diagnosis present

## 2017-01-06 DIAGNOSIS — N390 Urinary tract infection, site not specified: Secondary | ICD-10-CM | POA: Diagnosis present

## 2017-01-06 DIAGNOSIS — K922 Gastrointestinal hemorrhage, unspecified: Secondary | ICD-10-CM | POA: Diagnosis present

## 2017-01-06 DIAGNOSIS — S72142D Displaced intertrochanteric fracture of left femur, subsequent encounter for closed fracture with routine healing: Secondary | ICD-10-CM | POA: Diagnosis not present

## 2017-01-06 DIAGNOSIS — Z7901 Long term (current) use of anticoagulants: Secondary | ICD-10-CM | POA: Diagnosis not present

## 2017-01-06 DIAGNOSIS — T45515A Adverse effect of anticoagulants, initial encounter: Secondary | ICD-10-CM | POA: Diagnosis present

## 2017-01-06 DIAGNOSIS — I69351 Hemiplegia and hemiparesis following cerebral infarction affecting right dominant side: Secondary | ICD-10-CM | POA: Diagnosis not present

## 2017-01-06 DIAGNOSIS — R791 Abnormal coagulation profile: Secondary | ICD-10-CM | POA: Diagnosis not present

## 2017-01-06 DIAGNOSIS — D62 Acute posthemorrhagic anemia: Secondary | ICD-10-CM | POA: Diagnosis not present

## 2017-01-06 DIAGNOSIS — K661 Hemoperitoneum: Secondary | ICD-10-CM | POA: Diagnosis not present

## 2017-01-06 DIAGNOSIS — M6281 Muscle weakness (generalized): Secondary | ICD-10-CM | POA: Diagnosis not present

## 2017-01-06 DIAGNOSIS — I4510 Unspecified right bundle-branch block: Secondary | ICD-10-CM | POA: Diagnosis present

## 2017-01-06 DIAGNOSIS — D6832 Hemorrhagic disorder due to extrinsic circulating anticoagulants: Secondary | ICD-10-CM | POA: Diagnosis present

## 2017-01-06 DIAGNOSIS — Z8673 Personal history of transient ischemic attack (TIA), and cerebral infarction without residual deficits: Secondary | ICD-10-CM | POA: Insufficient documentation

## 2017-01-06 DIAGNOSIS — N3000 Acute cystitis without hematuria: Secondary | ICD-10-CM | POA: Diagnosis not present

## 2017-01-06 DIAGNOSIS — D500 Iron deficiency anemia secondary to blood loss (chronic): Secondary | ICD-10-CM | POA: Diagnosis not present

## 2017-01-06 DIAGNOSIS — R1031 Right lower quadrant pain: Secondary | ICD-10-CM | POA: Diagnosis not present

## 2017-01-06 DIAGNOSIS — D649 Anemia, unspecified: Secondary | ICD-10-CM | POA: Diagnosis not present

## 2017-01-06 DIAGNOSIS — G40909 Epilepsy, unspecified, not intractable, without status epilepticus: Secondary | ICD-10-CM | POA: Diagnosis not present

## 2017-01-06 DIAGNOSIS — I4892 Unspecified atrial flutter: Secondary | ICD-10-CM | POA: Diagnosis not present

## 2017-01-06 DIAGNOSIS — R509 Fever, unspecified: Secondary | ICD-10-CM | POA: Diagnosis not present

## 2017-01-07 DIAGNOSIS — R509 Fever, unspecified: Secondary | ICD-10-CM | POA: Insufficient documentation

## 2017-01-11 ENCOUNTER — Telehealth: Payer: Self-pay | Admitting: Internal Medicine

## 2017-01-11 DIAGNOSIS — M6281 Muscle weakness (generalized): Secondary | ICD-10-CM | POA: Diagnosis not present

## 2017-01-11 DIAGNOSIS — Z7901 Long term (current) use of anticoagulants: Secondary | ICD-10-CM | POA: Diagnosis not present

## 2017-01-11 DIAGNOSIS — R2681 Unsteadiness on feet: Secondary | ICD-10-CM | POA: Diagnosis not present

## 2017-01-11 DIAGNOSIS — Z7952 Long term (current) use of systemic steroids: Secondary | ICD-10-CM | POA: Insufficient documentation

## 2017-01-11 DIAGNOSIS — R531 Weakness: Secondary | ICD-10-CM | POA: Diagnosis not present

## 2017-01-11 DIAGNOSIS — N3000 Acute cystitis without hematuria: Secondary | ICD-10-CM | POA: Diagnosis not present

## 2017-01-11 DIAGNOSIS — G4089 Other seizures: Secondary | ICD-10-CM | POA: Diagnosis not present

## 2017-01-11 DIAGNOSIS — K922 Gastrointestinal hemorrhage, unspecified: Secondary | ICD-10-CM | POA: Diagnosis not present

## 2017-01-11 DIAGNOSIS — I4892 Unspecified atrial flutter: Secondary | ICD-10-CM | POA: Diagnosis not present

## 2017-01-11 DIAGNOSIS — D500 Iron deficiency anemia secondary to blood loss (chronic): Secondary | ICD-10-CM | POA: Diagnosis not present

## 2017-01-11 DIAGNOSIS — D649 Anemia, unspecified: Secondary | ICD-10-CM | POA: Diagnosis not present

## 2017-01-11 DIAGNOSIS — S72142D Displaced intertrochanteric fracture of left femur, subsequent encounter for closed fracture with routine healing: Secondary | ICD-10-CM | POA: Diagnosis not present

## 2017-01-11 DIAGNOSIS — I951 Orthostatic hypotension: Secondary | ICD-10-CM | POA: Diagnosis not present

## 2017-01-11 DIAGNOSIS — I4510 Unspecified right bundle-branch block: Secondary | ICD-10-CM | POA: Insufficient documentation

## 2017-01-11 DIAGNOSIS — G40909 Epilepsy, unspecified, not intractable, without status epilepticus: Secondary | ICD-10-CM | POA: Diagnosis not present

## 2017-01-11 DIAGNOSIS — K9289 Other specified diseases of the digestive system: Secondary | ICD-10-CM | POA: Diagnosis not present

## 2017-01-11 DIAGNOSIS — E612 Magnesium deficiency: Secondary | ICD-10-CM | POA: Diagnosis not present

## 2017-01-11 DIAGNOSIS — K661 Hemoperitoneum: Secondary | ICD-10-CM | POA: Diagnosis not present

## 2017-01-11 DIAGNOSIS — M353 Polymyalgia rheumatica: Secondary | ICD-10-CM | POA: Insufficient documentation

## 2017-01-11 NOTE — Telephone Encounter (Signed)
It does not look like from discharge summary they are stopping. Looks like he is getting Lovenox and Coumadin until INR is therapeutic  Please call back and explain or get further details.

## 2017-01-11 NOTE — Telephone Encounter (Signed)
Patient was bridging on discharge from  [facility] on Sep/2018. Since he is on [place] due to Hip Fracture.     Patient was admitted few days ago to [facility] with hematoma and low H/H (discharge today). MD is holding ALL anticoagulation for 2 weeks, then resume ONLY if recommend by cardiologist.    Patient already scheduled f/u with PCP but he needs f/u with cardiologist for final anticoagulation desicion. Daughter okay seen APP if needed.

## 2017-01-11 NOTE — Telephone Encounter (Signed)
New Message    Pt is being discharged today from the hospital to go back to rehab, the hospital is stopping his coumadin until he follows up with [facility], offered pt appt November 26 with [facility], wife wants patient seen on the 12th, APP's on [facility] team does not have any opening on the 12th? Please advise where to go from here.

## 2017-01-13 DIAGNOSIS — K661 Hemoperitoneum: Secondary | ICD-10-CM | POA: Diagnosis not present

## 2017-01-13 DIAGNOSIS — R531 Weakness: Secondary | ICD-10-CM | POA: Diagnosis not present

## 2017-01-13 DIAGNOSIS — G4089 Other seizures: Secondary | ICD-10-CM | POA: Diagnosis not present

## 2017-01-13 DIAGNOSIS — I951 Orthostatic hypotension: Secondary | ICD-10-CM | POA: Diagnosis not present

## 2017-01-14 NOTE — Telephone Encounter (Signed)
Discussed with Dr Rayann Heman and he recommends: Given prior stroke and atrial arrhythmias would resume anticoagulation when medically able

## 2017-01-14 NOTE — Telephone Encounter (Signed)
Telephoned pts daughter and she is concern about restarting blood thinner after the 2 week timeframe of holding. I made her aware of Dr Jackalyn Lombard recommendation of restarting due to his history of stroke and atrial arrhythmias. Daughter concerned that the Coumadin is the reason behind his bleeding and is reluctant to restart after the 2 week hold. Thus, called spoke to [facility], Dr Jackalyn Lombard nurse and was able to get a spot for an appt on 01/24/17 @8 :115am the day before [name]. Is to restart his Coumadin. [name], [name]'s daughter states he is currently in rehab and she doesn't think she will be able to bring him in on that date. She states she will call back next week to schedule. Also, instructed her to let us know when he restarts as we will need to schedule a follow up appt for an INR check for close monitoring.

## 2017-01-20 HISTORY — PX: HIP SURGERY: SHX245

## 2017-01-21 DIAGNOSIS — M353 Polymyalgia rheumatica: Secondary | ICD-10-CM | POA: Diagnosis not present

## 2017-01-21 DIAGNOSIS — G4089 Other seizures: Secondary | ICD-10-CM | POA: Diagnosis not present

## 2017-01-21 DIAGNOSIS — I951 Orthostatic hypotension: Secondary | ICD-10-CM | POA: Diagnosis not present

## 2017-01-21 DIAGNOSIS — K9289 Other specified diseases of the digestive system: Secondary | ICD-10-CM | POA: Diagnosis not present

## 2017-01-31 ENCOUNTER — Ambulatory Visit (INDEPENDENT_AMBULATORY_CARE_PROVIDER_SITE_OTHER): Payer: Medicare Other | Admitting: *Deleted

## 2017-01-31 DIAGNOSIS — Z1389 Encounter for screening for other disorder: Secondary | ICD-10-CM | POA: Diagnosis not present

## 2017-01-31 DIAGNOSIS — D500 Iron deficiency anemia secondary to blood loss (chronic): Secondary | ICD-10-CM | POA: Diagnosis not present

## 2017-01-31 DIAGNOSIS — I10 Essential (primary) hypertension: Secondary | ICD-10-CM | POA: Diagnosis not present

## 2017-01-31 DIAGNOSIS — G40209 Localization-related (focal) (partial) symptomatic epilepsy and epileptic syndromes with complex partial seizures, not intractable, without status epilepticus: Secondary | ICD-10-CM | POA: Diagnosis not present

## 2017-01-31 DIAGNOSIS — Z5181 Encounter for therapeutic drug level monitoring: Secondary | ICD-10-CM | POA: Diagnosis not present

## 2017-01-31 DIAGNOSIS — Z7901 Long term (current) use of anticoagulants: Secondary | ICD-10-CM | POA: Diagnosis not present

## 2017-01-31 DIAGNOSIS — Z1331 Encounter for screening for depression: Secondary | ICD-10-CM | POA: Diagnosis not present

## 2017-01-31 DIAGNOSIS — R296 Repeated falls: Secondary | ICD-10-CM | POA: Diagnosis not present

## 2017-01-31 DIAGNOSIS — I4510 Unspecified right bundle-branch block: Secondary | ICD-10-CM | POA: Diagnosis not present

## 2017-01-31 DIAGNOSIS — Z23 Encounter for immunization: Secondary | ICD-10-CM | POA: Diagnosis not present

## 2017-01-31 DIAGNOSIS — S72142D Displaced intertrochanteric fracture of left femur, subsequent encounter for closed fracture with routine healing: Secondary | ICD-10-CM | POA: Diagnosis not present

## 2017-01-31 DIAGNOSIS — Z9181 History of falling: Secondary | ICD-10-CM | POA: Diagnosis not present

## 2017-01-31 DIAGNOSIS — I4892 Unspecified atrial flutter: Secondary | ICD-10-CM | POA: Diagnosis not present

## 2017-01-31 DIAGNOSIS — G40909 Epilepsy, unspecified, not intractable, without status epilepticus: Secondary | ICD-10-CM | POA: Diagnosis not present

## 2017-01-31 DIAGNOSIS — Z8673 Personal history of transient ischemic attack (TIA), and cerebral infarction without residual deficits: Secondary | ICD-10-CM | POA: Diagnosis not present

## 2017-01-31 DIAGNOSIS — R200 Anesthesia of skin: Secondary | ICD-10-CM | POA: Diagnosis not present

## 2017-01-31 DIAGNOSIS — E43 Unspecified severe protein-calorie malnutrition: Secondary | ICD-10-CM | POA: Diagnosis not present

## 2017-01-31 DIAGNOSIS — K922 Gastrointestinal hemorrhage, unspecified: Secondary | ICD-10-CM | POA: Diagnosis not present

## 2017-01-31 DIAGNOSIS — M353 Polymyalgia rheumatica: Secondary | ICD-10-CM | POA: Diagnosis not present

## 2017-01-31 DIAGNOSIS — Z87440 Personal history of urinary (tract) infections: Secondary | ICD-10-CM | POA: Diagnosis not present

## 2017-01-31 LAB — POCT INR: INR: 1.2

## 2017-01-31 MED ORDER — WARFARIN SODIUM 2.5 MG PO TABS: ORAL_TABLET | ORAL | 3 refills | Status: DC

## 2017-02-03 DIAGNOSIS — D500 Iron deficiency anemia secondary to blood loss (chronic): Secondary | ICD-10-CM | POA: Diagnosis not present

## 2017-02-03 DIAGNOSIS — I4510 Unspecified right bundle-branch block: Secondary | ICD-10-CM | POA: Diagnosis not present

## 2017-02-03 DIAGNOSIS — G40209 Localization-related (focal) (partial) symptomatic epilepsy and epileptic syndromes with complex partial seizures, not intractable, without status epilepticus: Secondary | ICD-10-CM | POA: Diagnosis not present

## 2017-02-03 DIAGNOSIS — K922 Gastrointestinal hemorrhage, unspecified: Secondary | ICD-10-CM | POA: Diagnosis not present

## 2017-02-03 DIAGNOSIS — S72142D Displaced intertrochanteric fracture of left femur, subsequent encounter for closed fracture with routine healing: Secondary | ICD-10-CM | POA: Diagnosis not present

## 2017-02-03 DIAGNOSIS — M353 Polymyalgia rheumatica: Secondary | ICD-10-CM | POA: Diagnosis not present

## 2017-02-08 DIAGNOSIS — G40209 Localization-related (focal) (partial) symptomatic epilepsy and epileptic syndromes with complex partial seizures, not intractable, without status epilepticus: Secondary | ICD-10-CM | POA: Diagnosis not present

## 2017-02-08 DIAGNOSIS — I4510 Unspecified right bundle-branch block: Secondary | ICD-10-CM | POA: Diagnosis not present

## 2017-02-08 DIAGNOSIS — M353 Polymyalgia rheumatica: Secondary | ICD-10-CM | POA: Diagnosis not present

## 2017-02-08 DIAGNOSIS — D500 Iron deficiency anemia secondary to blood loss (chronic): Secondary | ICD-10-CM | POA: Diagnosis not present

## 2017-02-08 DIAGNOSIS — S72142D Displaced intertrochanteric fracture of left femur, subsequent encounter for closed fracture with routine healing: Secondary | ICD-10-CM | POA: Diagnosis not present

## 2017-02-08 DIAGNOSIS — K922 Gastrointestinal hemorrhage, unspecified: Secondary | ICD-10-CM | POA: Diagnosis not present

## 2017-02-08 DIAGNOSIS — Z7901 Long term (current) use of anticoagulants: Secondary | ICD-10-CM | POA: Diagnosis not present

## 2017-02-08 LAB — PROTIME-INR: INR: 1.9 — AB (ref 0.9–1.1)

## 2017-02-09 ENCOUNTER — Ambulatory Visit (INDEPENDENT_AMBULATORY_CARE_PROVIDER_SITE_OTHER): Payer: Medicare Other | Admitting: Internal Medicine

## 2017-02-09 DIAGNOSIS — I4892 Unspecified atrial flutter: Secondary | ICD-10-CM | POA: Diagnosis not present

## 2017-02-09 DIAGNOSIS — Z5181 Encounter for therapeutic drug level monitoring: Secondary | ICD-10-CM

## 2017-02-14 DIAGNOSIS — D500 Iron deficiency anemia secondary to blood loss (chronic): Secondary | ICD-10-CM | POA: Diagnosis not present

## 2017-02-14 DIAGNOSIS — G40209 Localization-related (focal) (partial) symptomatic epilepsy and epileptic syndromes with complex partial seizures, not intractable, without status epilepticus: Secondary | ICD-10-CM | POA: Diagnosis not present

## 2017-02-14 DIAGNOSIS — K922 Gastrointestinal hemorrhage, unspecified: Secondary | ICD-10-CM | POA: Diagnosis not present

## 2017-02-14 DIAGNOSIS — S72142D Displaced intertrochanteric fracture of left femur, subsequent encounter for closed fracture with routine healing: Secondary | ICD-10-CM | POA: Diagnosis not present

## 2017-02-14 DIAGNOSIS — I4510 Unspecified right bundle-branch block: Secondary | ICD-10-CM | POA: Diagnosis not present

## 2017-02-14 DIAGNOSIS — M353 Polymyalgia rheumatica: Secondary | ICD-10-CM | POA: Diagnosis not present

## 2017-02-21 ENCOUNTER — Telehealth: Payer: Self-pay | Admitting: Internal Medicine

## 2017-02-21 ENCOUNTER — Ambulatory Visit (INDEPENDENT_AMBULATORY_CARE_PROVIDER_SITE_OTHER): Payer: Medicare Other | Admitting: Cardiology

## 2017-02-21 DIAGNOSIS — K922 Gastrointestinal hemorrhage, unspecified: Secondary | ICD-10-CM | POA: Diagnosis not present

## 2017-02-21 DIAGNOSIS — M353 Polymyalgia rheumatica: Secondary | ICD-10-CM | POA: Diagnosis not present

## 2017-02-21 DIAGNOSIS — I4892 Unspecified atrial flutter: Secondary | ICD-10-CM | POA: Diagnosis not present

## 2017-02-21 DIAGNOSIS — S72142D Displaced intertrochanteric fracture of left femur, subsequent encounter for closed fracture with routine healing: Secondary | ICD-10-CM | POA: Diagnosis not present

## 2017-02-21 DIAGNOSIS — D500 Iron deficiency anemia secondary to blood loss (chronic): Secondary | ICD-10-CM | POA: Diagnosis not present

## 2017-02-21 DIAGNOSIS — G40209 Localization-related (focal) (partial) symptomatic epilepsy and epileptic syndromes with complex partial seizures, not intractable, without status epilepticus: Secondary | ICD-10-CM | POA: Diagnosis not present

## 2017-02-21 DIAGNOSIS — Z5181 Encounter for therapeutic drug level monitoring: Secondary | ICD-10-CM | POA: Diagnosis not present

## 2017-02-21 DIAGNOSIS — I4510 Unspecified right bundle-branch block: Secondary | ICD-10-CM | POA: Diagnosis not present

## 2017-02-21 LAB — POCT INR: INR: 2

## 2017-02-21 NOTE — Telephone Encounter (Signed)
Tommi Rumps RN ( [place]) Wants to know if Dr. Rayann Heman would like the PT/INR drawn on tomorrow , because he was not seen by a Home Health Nurse on 02/16/17. Please call

## 2017-02-21 NOTE — Telephone Encounter (Signed)
Informed Cory, RN that patient was seen today at the coumadin clinic and pt does not need to come in tomorrow. She verbalized understanding and thanked me for the call.

## 2017-03-01 DIAGNOSIS — M353 Polymyalgia rheumatica: Secondary | ICD-10-CM | POA: Diagnosis not present

## 2017-03-01 DIAGNOSIS — D500 Iron deficiency anemia secondary to blood loss (chronic): Secondary | ICD-10-CM | POA: Diagnosis not present

## 2017-03-01 DIAGNOSIS — K922 Gastrointestinal hemorrhage, unspecified: Secondary | ICD-10-CM | POA: Diagnosis not present

## 2017-03-01 DIAGNOSIS — I4510 Unspecified right bundle-branch block: Secondary | ICD-10-CM | POA: Diagnosis not present

## 2017-03-01 DIAGNOSIS — G40209 Localization-related (focal) (partial) symptomatic epilepsy and epileptic syndromes with complex partial seizures, not intractable, without status epilepticus: Secondary | ICD-10-CM | POA: Diagnosis not present

## 2017-03-01 DIAGNOSIS — S72142D Displaced intertrochanteric fracture of left femur, subsequent encounter for closed fracture with routine healing: Secondary | ICD-10-CM | POA: Diagnosis not present

## 2017-03-01 LAB — POCT INR: INR: 2.1

## 2017-03-02 ENCOUNTER — Ambulatory Visit (INDEPENDENT_AMBULATORY_CARE_PROVIDER_SITE_OTHER): Payer: Medicare Other

## 2017-03-02 DIAGNOSIS — I4892 Unspecified atrial flutter: Secondary | ICD-10-CM

## 2017-03-02 DIAGNOSIS — Z5181 Encounter for therapeutic drug level monitoring: Secondary | ICD-10-CM | POA: Diagnosis not present

## 2017-03-02 NOTE — Patient Instructions (Signed)
Spoke with Amy [facility] nurse and advised to have pt continue taking 1 tablet every day except 2 tablets on Tuesdays,  Thursdays and Saturdays. Recheck INR in 10 days.  Pt will remain on Prednisone 10mg  he's taking 1 tablet alt with 1.5 tablet. Will see  Dr. Felipa Eth the week before Xmas daughter will call if med is discontinued or changed. Called daughter, [name] with dose instructions # [phone].

## 2017-03-11 ENCOUNTER — Ambulatory Visit (INDEPENDENT_AMBULATORY_CARE_PROVIDER_SITE_OTHER): Payer: Medicare Other | Admitting: Internal Medicine

## 2017-03-11 DIAGNOSIS — M353 Polymyalgia rheumatica: Secondary | ICD-10-CM | POA: Diagnosis not present

## 2017-03-11 DIAGNOSIS — G40209 Localization-related (focal) (partial) symptomatic epilepsy and epileptic syndromes with complex partial seizures, not intractable, without status epilepticus: Secondary | ICD-10-CM | POA: Diagnosis not present

## 2017-03-11 DIAGNOSIS — I4510 Unspecified right bundle-branch block: Secondary | ICD-10-CM | POA: Diagnosis not present

## 2017-03-11 DIAGNOSIS — K922 Gastrointestinal hemorrhage, unspecified: Secondary | ICD-10-CM | POA: Diagnosis not present

## 2017-03-11 DIAGNOSIS — D500 Iron deficiency anemia secondary to blood loss (chronic): Secondary | ICD-10-CM | POA: Diagnosis not present

## 2017-03-11 DIAGNOSIS — Z5181 Encounter for therapeutic drug level monitoring: Secondary | ICD-10-CM | POA: Diagnosis not present

## 2017-03-11 DIAGNOSIS — S72142D Displaced intertrochanteric fracture of left femur, subsequent encounter for closed fracture with routine healing: Secondary | ICD-10-CM | POA: Diagnosis not present

## 2017-03-11 LAB — POCT INR: INR: 1.9

## 2017-03-11 NOTE — Patient Instructions (Signed)
Description   Spoke with Amy [facility] nurse and advised to have pt take 2 tablets today then continue taking 1 tablet every day except 2 tablets on Tuesdays,  Thursdays and Saturdays. Recheck INR in 2 weeks.  Pt will remain on Prednisone 10mg  he's taking 1 tablet alt with 1.5 tablet. Will see  Dr. Felipa Eth the week before Xmas daughter will call if med is discontinued or changed. Called daughter, [name] with dose instructions # [phone].

## 2017-03-18 ENCOUNTER — Ambulatory Visit (INDEPENDENT_AMBULATORY_CARE_PROVIDER_SITE_OTHER): Payer: Medicare Other | Admitting: Cardiovascular Disease

## 2017-03-18 DIAGNOSIS — Z5181 Encounter for therapeutic drug level monitoring: Secondary | ICD-10-CM

## 2017-03-18 DIAGNOSIS — I4892 Unspecified atrial flutter: Secondary | ICD-10-CM | POA: Diagnosis not present

## 2017-03-18 DIAGNOSIS — S72142D Displaced intertrochanteric fracture of left femur, subsequent encounter for closed fracture with routine healing: Secondary | ICD-10-CM | POA: Diagnosis not present

## 2017-03-18 DIAGNOSIS — G40209 Localization-related (focal) (partial) symptomatic epilepsy and epileptic syndromes with complex partial seizures, not intractable, without status epilepticus: Secondary | ICD-10-CM | POA: Diagnosis not present

## 2017-03-18 DIAGNOSIS — M353 Polymyalgia rheumatica: Secondary | ICD-10-CM | POA: Diagnosis not present

## 2017-03-18 DIAGNOSIS — I4510 Unspecified right bundle-branch block: Secondary | ICD-10-CM | POA: Diagnosis not present

## 2017-03-18 DIAGNOSIS — D500 Iron deficiency anemia secondary to blood loss (chronic): Secondary | ICD-10-CM | POA: Diagnosis not present

## 2017-03-18 DIAGNOSIS — K922 Gastrointestinal hemorrhage, unspecified: Secondary | ICD-10-CM | POA: Diagnosis not present

## 2017-03-18 LAB — POCT INR: INR: 3

## 2017-03-25 ENCOUNTER — Ambulatory Visit (INDEPENDENT_AMBULATORY_CARE_PROVIDER_SITE_OTHER): Payer: Medicare Other | Admitting: Internal Medicine

## 2017-03-25 DIAGNOSIS — I4892 Unspecified atrial flutter: Secondary | ICD-10-CM | POA: Diagnosis not present

## 2017-03-25 DIAGNOSIS — G40209 Localization-related (focal) (partial) symptomatic epilepsy and epileptic syndromes with complex partial seizures, not intractable, without status epilepticus: Secondary | ICD-10-CM | POA: Diagnosis not present

## 2017-03-25 DIAGNOSIS — Z5181 Encounter for therapeutic drug level monitoring: Secondary | ICD-10-CM | POA: Diagnosis not present

## 2017-03-25 DIAGNOSIS — S72142D Displaced intertrochanteric fracture of left femur, subsequent encounter for closed fracture with routine healing: Secondary | ICD-10-CM | POA: Diagnosis not present

## 2017-03-25 DIAGNOSIS — M353 Polymyalgia rheumatica: Secondary | ICD-10-CM | POA: Diagnosis not present

## 2017-03-25 DIAGNOSIS — D500 Iron deficiency anemia secondary to blood loss (chronic): Secondary | ICD-10-CM | POA: Diagnosis not present

## 2017-03-25 DIAGNOSIS — K922 Gastrointestinal hemorrhage, unspecified: Secondary | ICD-10-CM | POA: Diagnosis not present

## 2017-03-25 DIAGNOSIS — I4510 Unspecified right bundle-branch block: Secondary | ICD-10-CM | POA: Diagnosis not present

## 2017-03-25 LAB — POCT INR: INR: 2.8

## 2017-03-31 ENCOUNTER — Ambulatory Visit (INDEPENDENT_AMBULATORY_CARE_PROVIDER_SITE_OTHER): Payer: Medicare Other | Admitting: Cardiovascular Disease

## 2017-03-31 DIAGNOSIS — S72142D Displaced intertrochanteric fracture of left femur, subsequent encounter for closed fracture with routine healing: Secondary | ICD-10-CM | POA: Diagnosis not present

## 2017-03-31 DIAGNOSIS — K922 Gastrointestinal hemorrhage, unspecified: Secondary | ICD-10-CM | POA: Diagnosis not present

## 2017-03-31 DIAGNOSIS — M353 Polymyalgia rheumatica: Secondary | ICD-10-CM | POA: Diagnosis not present

## 2017-03-31 DIAGNOSIS — I4510 Unspecified right bundle-branch block: Secondary | ICD-10-CM | POA: Diagnosis not present

## 2017-03-31 DIAGNOSIS — D500 Iron deficiency anemia secondary to blood loss (chronic): Secondary | ICD-10-CM | POA: Diagnosis not present

## 2017-03-31 DIAGNOSIS — I4892 Unspecified atrial flutter: Secondary | ICD-10-CM

## 2017-03-31 DIAGNOSIS — G40209 Localization-related (focal) (partial) symptomatic epilepsy and epileptic syndromes with complex partial seizures, not intractable, without status epilepticus: Secondary | ICD-10-CM | POA: Diagnosis not present

## 2017-03-31 DIAGNOSIS — Z5181 Encounter for therapeutic drug level monitoring: Secondary | ICD-10-CM

## 2017-03-31 LAB — POCT INR: INR: 3.6

## 2017-03-31 NOTE — Patient Instructions (Signed)
Description   Spoke with Amy [facility] nurse and advised pt hold today's dose then continue to continue taking 1 tablet every day except 2 tablets on Tuesdays, Thursdays, and Saturdays. Recheck INR in 1 week. Pt Prednisone dose changed to Prednisone 10mg  daily per [facility] RN. Called daughter, [name] with dose instructions # [phone].

## 2017-04-01 DIAGNOSIS — M353 Polymyalgia rheumatica: Secondary | ICD-10-CM | POA: Diagnosis not present

## 2017-04-01 DIAGNOSIS — R296 Repeated falls: Secondary | ICD-10-CM | POA: Diagnosis not present

## 2017-04-01 DIAGNOSIS — Z87440 Personal history of urinary (tract) infections: Secondary | ICD-10-CM | POA: Diagnosis not present

## 2017-04-01 DIAGNOSIS — E43 Unspecified severe protein-calorie malnutrition: Secondary | ICD-10-CM | POA: Diagnosis not present

## 2017-04-01 DIAGNOSIS — G40209 Localization-related (focal) (partial) symptomatic epilepsy and epileptic syndromes with complex partial seizures, not intractable, without status epilepticus: Secondary | ICD-10-CM | POA: Diagnosis not present

## 2017-04-01 DIAGNOSIS — I4510 Unspecified right bundle-branch block: Secondary | ICD-10-CM | POA: Diagnosis not present

## 2017-04-01 DIAGNOSIS — Z7901 Long term (current) use of anticoagulants: Secondary | ICD-10-CM | POA: Diagnosis not present

## 2017-04-01 DIAGNOSIS — K922 Gastrointestinal hemorrhage, unspecified: Secondary | ICD-10-CM | POA: Diagnosis not present

## 2017-04-01 DIAGNOSIS — S72142D Displaced intertrochanteric fracture of left femur, subsequent encounter for closed fracture with routine healing: Secondary | ICD-10-CM | POA: Diagnosis not present

## 2017-04-01 DIAGNOSIS — Z8673 Personal history of transient ischemic attack (TIA), and cerebral infarction without residual deficits: Secondary | ICD-10-CM | POA: Diagnosis not present

## 2017-04-01 DIAGNOSIS — Z9181 History of falling: Secondary | ICD-10-CM | POA: Diagnosis not present

## 2017-04-01 DIAGNOSIS — I4892 Unspecified atrial flutter: Secondary | ICD-10-CM | POA: Diagnosis not present

## 2017-04-01 DIAGNOSIS — Z5181 Encounter for therapeutic drug level monitoring: Secondary | ICD-10-CM | POA: Diagnosis not present

## 2017-04-01 DIAGNOSIS — D500 Iron deficiency anemia secondary to blood loss (chronic): Secondary | ICD-10-CM | POA: Diagnosis not present

## 2017-04-07 ENCOUNTER — Ambulatory Visit (INDEPENDENT_AMBULATORY_CARE_PROVIDER_SITE_OTHER): Payer: Medicare Other

## 2017-04-07 DIAGNOSIS — I4510 Unspecified right bundle-branch block: Secondary | ICD-10-CM | POA: Diagnosis not present

## 2017-04-07 DIAGNOSIS — Z5181 Encounter for therapeutic drug level monitoring: Secondary | ICD-10-CM

## 2017-04-07 DIAGNOSIS — M353 Polymyalgia rheumatica: Secondary | ICD-10-CM | POA: Diagnosis not present

## 2017-04-07 DIAGNOSIS — D500 Iron deficiency anemia secondary to blood loss (chronic): Secondary | ICD-10-CM | POA: Diagnosis not present

## 2017-04-07 DIAGNOSIS — I4892 Unspecified atrial flutter: Secondary | ICD-10-CM | POA: Diagnosis not present

## 2017-04-07 DIAGNOSIS — K922 Gastrointestinal hemorrhage, unspecified: Secondary | ICD-10-CM | POA: Diagnosis not present

## 2017-04-07 DIAGNOSIS — G40209 Localization-related (focal) (partial) symptomatic epilepsy and epileptic syndromes with complex partial seizures, not intractable, without status epilepticus: Secondary | ICD-10-CM | POA: Diagnosis not present

## 2017-04-07 DIAGNOSIS — S72142D Displaced intertrochanteric fracture of left femur, subsequent encounter for closed fracture with routine healing: Secondary | ICD-10-CM | POA: Diagnosis not present

## 2017-04-07 LAB — POCT INR: INR: 2.6

## 2017-04-19 ENCOUNTER — Ambulatory Visit (INDEPENDENT_AMBULATORY_CARE_PROVIDER_SITE_OTHER): Payer: Medicare Other | Admitting: Cardiology

## 2017-04-19 DIAGNOSIS — Z5181 Encounter for therapeutic drug level monitoring: Secondary | ICD-10-CM

## 2017-04-19 DIAGNOSIS — K922 Gastrointestinal hemorrhage, unspecified: Secondary | ICD-10-CM | POA: Diagnosis not present

## 2017-04-19 DIAGNOSIS — M353 Polymyalgia rheumatica: Secondary | ICD-10-CM | POA: Diagnosis not present

## 2017-04-19 DIAGNOSIS — S72142D Displaced intertrochanteric fracture of left femur, subsequent encounter for closed fracture with routine healing: Secondary | ICD-10-CM | POA: Diagnosis not present

## 2017-04-19 DIAGNOSIS — I4892 Unspecified atrial flutter: Secondary | ICD-10-CM

## 2017-04-19 DIAGNOSIS — I4510 Unspecified right bundle-branch block: Secondary | ICD-10-CM | POA: Diagnosis not present

## 2017-04-19 DIAGNOSIS — G40209 Localization-related (focal) (partial) symptomatic epilepsy and epileptic syndromes with complex partial seizures, not intractable, without status epilepticus: Secondary | ICD-10-CM | POA: Diagnosis not present

## 2017-04-19 DIAGNOSIS — D500 Iron deficiency anemia secondary to blood loss (chronic): Secondary | ICD-10-CM | POA: Diagnosis not present

## 2017-04-19 LAB — POCT INR: INR: 2.4

## 2017-04-21 NOTE — Patient Instructions (Signed)
Description   Spoke with [name] nurse and advised to have pt continue taking 1 tablet every day except 2 tablets on Tuesdays, Thursdays, and Saturdays. Recheck INR in 10 days.  Pt Prednisone dose changed to Prednisone 10mg  daily per [name] RN. Called daughter, [name] with dose instructions # [phone].

## 2017-04-22 DIAGNOSIS — Z79899 Other long term (current) drug therapy: Secondary | ICD-10-CM | POA: Diagnosis not present

## 2017-04-22 DIAGNOSIS — M353 Polymyalgia rheumatica: Secondary | ICD-10-CM | POA: Diagnosis not present

## 2017-04-22 DIAGNOSIS — E46 Unspecified protein-calorie malnutrition: Secondary | ICD-10-CM | POA: Diagnosis not present

## 2017-04-22 DIAGNOSIS — G40909 Epilepsy, unspecified, not intractable, without status epilepticus: Secondary | ICD-10-CM | POA: Diagnosis not present

## 2017-04-22 DIAGNOSIS — I10 Essential (primary) hypertension: Secondary | ICD-10-CM | POA: Diagnosis not present

## 2017-04-26 DIAGNOSIS — D500 Iron deficiency anemia secondary to blood loss (chronic): Secondary | ICD-10-CM | POA: Diagnosis not present

## 2017-04-26 DIAGNOSIS — M353 Polymyalgia rheumatica: Secondary | ICD-10-CM | POA: Diagnosis not present

## 2017-04-26 DIAGNOSIS — S72142D Displaced intertrochanteric fracture of left femur, subsequent encounter for closed fracture with routine healing: Secondary | ICD-10-CM | POA: Diagnosis not present

## 2017-04-26 DIAGNOSIS — G40209 Localization-related (focal) (partial) symptomatic epilepsy and epileptic syndromes with complex partial seizures, not intractable, without status epilepticus: Secondary | ICD-10-CM | POA: Diagnosis not present

## 2017-04-26 DIAGNOSIS — I4510 Unspecified right bundle-branch block: Secondary | ICD-10-CM | POA: Diagnosis not present

## 2017-04-26 DIAGNOSIS — K922 Gastrointestinal hemorrhage, unspecified: Secondary | ICD-10-CM | POA: Diagnosis not present

## 2017-04-28 DIAGNOSIS — G40909 Epilepsy, unspecified, not intractable, without status epilepticus: Secondary | ICD-10-CM | POA: Diagnosis not present

## 2017-04-28 DIAGNOSIS — M353 Polymyalgia rheumatica: Secondary | ICD-10-CM | POA: Diagnosis not present

## 2017-04-28 DIAGNOSIS — Z79899 Other long term (current) drug therapy: Secondary | ICD-10-CM | POA: Diagnosis not present

## 2017-04-28 DIAGNOSIS — Z5181 Encounter for therapeutic drug level monitoring: Secondary | ICD-10-CM | POA: Diagnosis not present

## 2017-04-28 DIAGNOSIS — K922 Gastrointestinal hemorrhage, unspecified: Secondary | ICD-10-CM | POA: Diagnosis not present

## 2017-04-28 DIAGNOSIS — S72142D Displaced intertrochanteric fracture of left femur, subsequent encounter for closed fracture with routine healing: Secondary | ICD-10-CM | POA: Diagnosis not present

## 2017-04-28 DIAGNOSIS — D500 Iron deficiency anemia secondary to blood loss (chronic): Secondary | ICD-10-CM | POA: Diagnosis not present

## 2017-04-28 DIAGNOSIS — I4510 Unspecified right bundle-branch block: Secondary | ICD-10-CM | POA: Diagnosis not present

## 2017-04-28 DIAGNOSIS — G40209 Localization-related (focal) (partial) symptomatic epilepsy and epileptic syndromes with complex partial seizures, not intractable, without status epilepticus: Secondary | ICD-10-CM | POA: Diagnosis not present

## 2017-05-02 DIAGNOSIS — M353 Polymyalgia rheumatica: Secondary | ICD-10-CM | POA: Diagnosis not present

## 2017-05-02 DIAGNOSIS — I4510 Unspecified right bundle-branch block: Secondary | ICD-10-CM | POA: Diagnosis not present

## 2017-05-02 DIAGNOSIS — S72142D Displaced intertrochanteric fracture of left femur, subsequent encounter for closed fracture with routine healing: Secondary | ICD-10-CM | POA: Diagnosis not present

## 2017-05-02 DIAGNOSIS — D500 Iron deficiency anemia secondary to blood loss (chronic): Secondary | ICD-10-CM | POA: Diagnosis not present

## 2017-05-02 DIAGNOSIS — G40209 Localization-related (focal) (partial) symptomatic epilepsy and epileptic syndromes with complex partial seizures, not intractable, without status epilepticus: Secondary | ICD-10-CM | POA: Diagnosis not present

## 2017-05-02 DIAGNOSIS — K922 Gastrointestinal hemorrhage, unspecified: Secondary | ICD-10-CM | POA: Diagnosis not present

## 2017-05-03 ENCOUNTER — Ambulatory Visit (INDEPENDENT_AMBULATORY_CARE_PROVIDER_SITE_OTHER): Payer: Medicare Other

## 2017-05-03 DIAGNOSIS — I4892 Unspecified atrial flutter: Secondary | ICD-10-CM

## 2017-05-03 DIAGNOSIS — I4510 Unspecified right bundle-branch block: Secondary | ICD-10-CM | POA: Diagnosis not present

## 2017-05-03 DIAGNOSIS — D500 Iron deficiency anemia secondary to blood loss (chronic): Secondary | ICD-10-CM | POA: Diagnosis not present

## 2017-05-03 DIAGNOSIS — K922 Gastrointestinal hemorrhage, unspecified: Secondary | ICD-10-CM | POA: Diagnosis not present

## 2017-05-03 DIAGNOSIS — S72142D Displaced intertrochanteric fracture of left femur, subsequent encounter for closed fracture with routine healing: Secondary | ICD-10-CM | POA: Diagnosis not present

## 2017-05-03 DIAGNOSIS — Z5181 Encounter for therapeutic drug level monitoring: Secondary | ICD-10-CM

## 2017-05-03 DIAGNOSIS — M353 Polymyalgia rheumatica: Secondary | ICD-10-CM | POA: Diagnosis not present

## 2017-05-03 DIAGNOSIS — G40209 Localization-related (focal) (partial) symptomatic epilepsy and epileptic syndromes with complex partial seizures, not intractable, without status epilepticus: Secondary | ICD-10-CM | POA: Diagnosis not present

## 2017-05-03 LAB — POCT INR: INR: 3

## 2017-05-03 NOTE — Patient Instructions (Signed)
Description   Spoke with Amy [facility] nurse and advised to have pt start taking 1 tablet every day except 2 tablets on Tuesdays and Saturdays. Recheck INR in 10 days.  Pt Prednisone dose increased to 10mg  daily except 15mg  on MWF per [facility] RN. Amy filled pt's pill box while in home.  Daughter Shepard General # [phone].

## 2017-05-10 DIAGNOSIS — K922 Gastrointestinal hemorrhage, unspecified: Secondary | ICD-10-CM | POA: Diagnosis not present

## 2017-05-10 DIAGNOSIS — M353 Polymyalgia rheumatica: Secondary | ICD-10-CM | POA: Diagnosis not present

## 2017-05-10 DIAGNOSIS — S72142D Displaced intertrochanteric fracture of left femur, subsequent encounter for closed fracture with routine healing: Secondary | ICD-10-CM | POA: Diagnosis not present

## 2017-05-10 DIAGNOSIS — I4510 Unspecified right bundle-branch block: Secondary | ICD-10-CM | POA: Diagnosis not present

## 2017-05-10 DIAGNOSIS — D500 Iron deficiency anemia secondary to blood loss (chronic): Secondary | ICD-10-CM | POA: Diagnosis not present

## 2017-05-10 DIAGNOSIS — G40209 Localization-related (focal) (partial) symptomatic epilepsy and epileptic syndromes with complex partial seizures, not intractable, without status epilepticus: Secondary | ICD-10-CM | POA: Diagnosis not present

## 2017-05-11 DIAGNOSIS — M353 Polymyalgia rheumatica: Secondary | ICD-10-CM | POA: Diagnosis not present

## 2017-05-11 DIAGNOSIS — K922 Gastrointestinal hemorrhage, unspecified: Secondary | ICD-10-CM | POA: Diagnosis not present

## 2017-05-11 DIAGNOSIS — G40209 Localization-related (focal) (partial) symptomatic epilepsy and epileptic syndromes with complex partial seizures, not intractable, without status epilepticus: Secondary | ICD-10-CM | POA: Diagnosis not present

## 2017-05-11 DIAGNOSIS — S72142D Displaced intertrochanteric fracture of left femur, subsequent encounter for closed fracture with routine healing: Secondary | ICD-10-CM | POA: Diagnosis not present

## 2017-05-11 DIAGNOSIS — I4510 Unspecified right bundle-branch block: Secondary | ICD-10-CM | POA: Diagnosis not present

## 2017-05-11 DIAGNOSIS — D500 Iron deficiency anemia secondary to blood loss (chronic): Secondary | ICD-10-CM | POA: Diagnosis not present

## 2017-05-12 ENCOUNTER — Ambulatory Visit (INDEPENDENT_AMBULATORY_CARE_PROVIDER_SITE_OTHER): Payer: Medicare Other | Admitting: Cardiology

## 2017-05-12 DIAGNOSIS — Z5181 Encounter for therapeutic drug level monitoring: Secondary | ICD-10-CM | POA: Diagnosis not present

## 2017-05-12 DIAGNOSIS — I4510 Unspecified right bundle-branch block: Secondary | ICD-10-CM | POA: Diagnosis not present

## 2017-05-12 DIAGNOSIS — G40209 Localization-related (focal) (partial) symptomatic epilepsy and epileptic syndromes with complex partial seizures, not intractable, without status epilepticus: Secondary | ICD-10-CM | POA: Diagnosis not present

## 2017-05-12 DIAGNOSIS — K922 Gastrointestinal hemorrhage, unspecified: Secondary | ICD-10-CM | POA: Diagnosis not present

## 2017-05-12 DIAGNOSIS — D500 Iron deficiency anemia secondary to blood loss (chronic): Secondary | ICD-10-CM | POA: Diagnosis not present

## 2017-05-12 DIAGNOSIS — S72142D Displaced intertrochanteric fracture of left femur, subsequent encounter for closed fracture with routine healing: Secondary | ICD-10-CM | POA: Diagnosis not present

## 2017-05-12 DIAGNOSIS — M353 Polymyalgia rheumatica: Secondary | ICD-10-CM | POA: Diagnosis not present

## 2017-05-12 LAB — POCT INR: INR: 2.6

## 2017-05-12 NOTE — Patient Instructions (Signed)
Description   Spoke with Amy [facility] nurse and advised to have pt start taking 1 tablet every day except 2 tablets on Tuesdays and Saturdays. Recheck INR in 1 week.  Pt Prednisone dose increased to 10mg  daily except 15mg  on MWF per [facility] RN. Amy filled pt's pill box while in home. Last [place] visit week of 3/4. Daughter Shepard General # [phone].

## 2017-05-17 IMAGING — CR DG CHEST 2V
2 series · 2 of 2 positions shown · non-contrast
Comparison: 12/01/2015

CLINICAL DATA: F/u left lower lobe Pneumonia from 12/01/15, HTN,
A-fib, there are no current chest complaints

EXAM:
CHEST  2 VIEW

[w chest pa *]
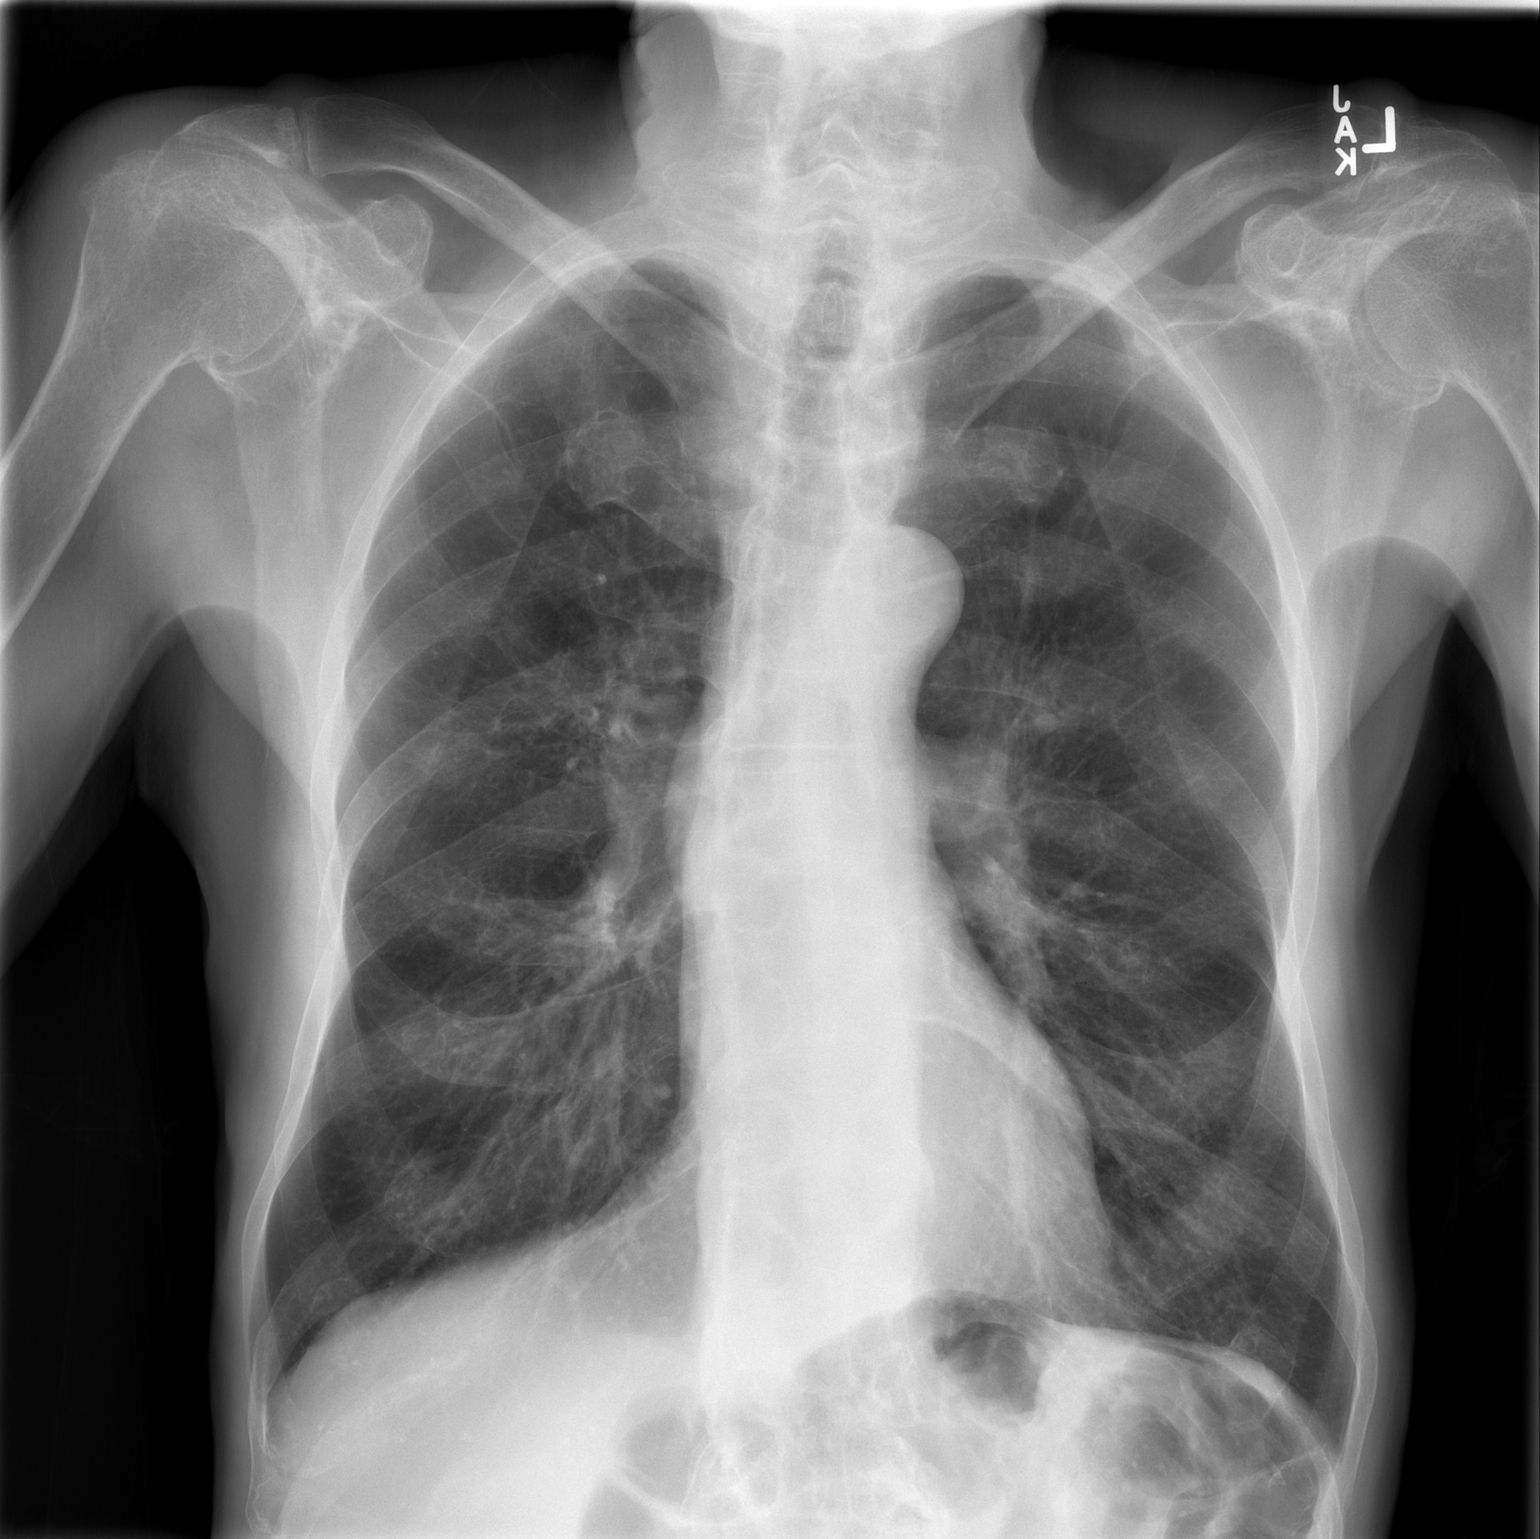

[w chest lat]
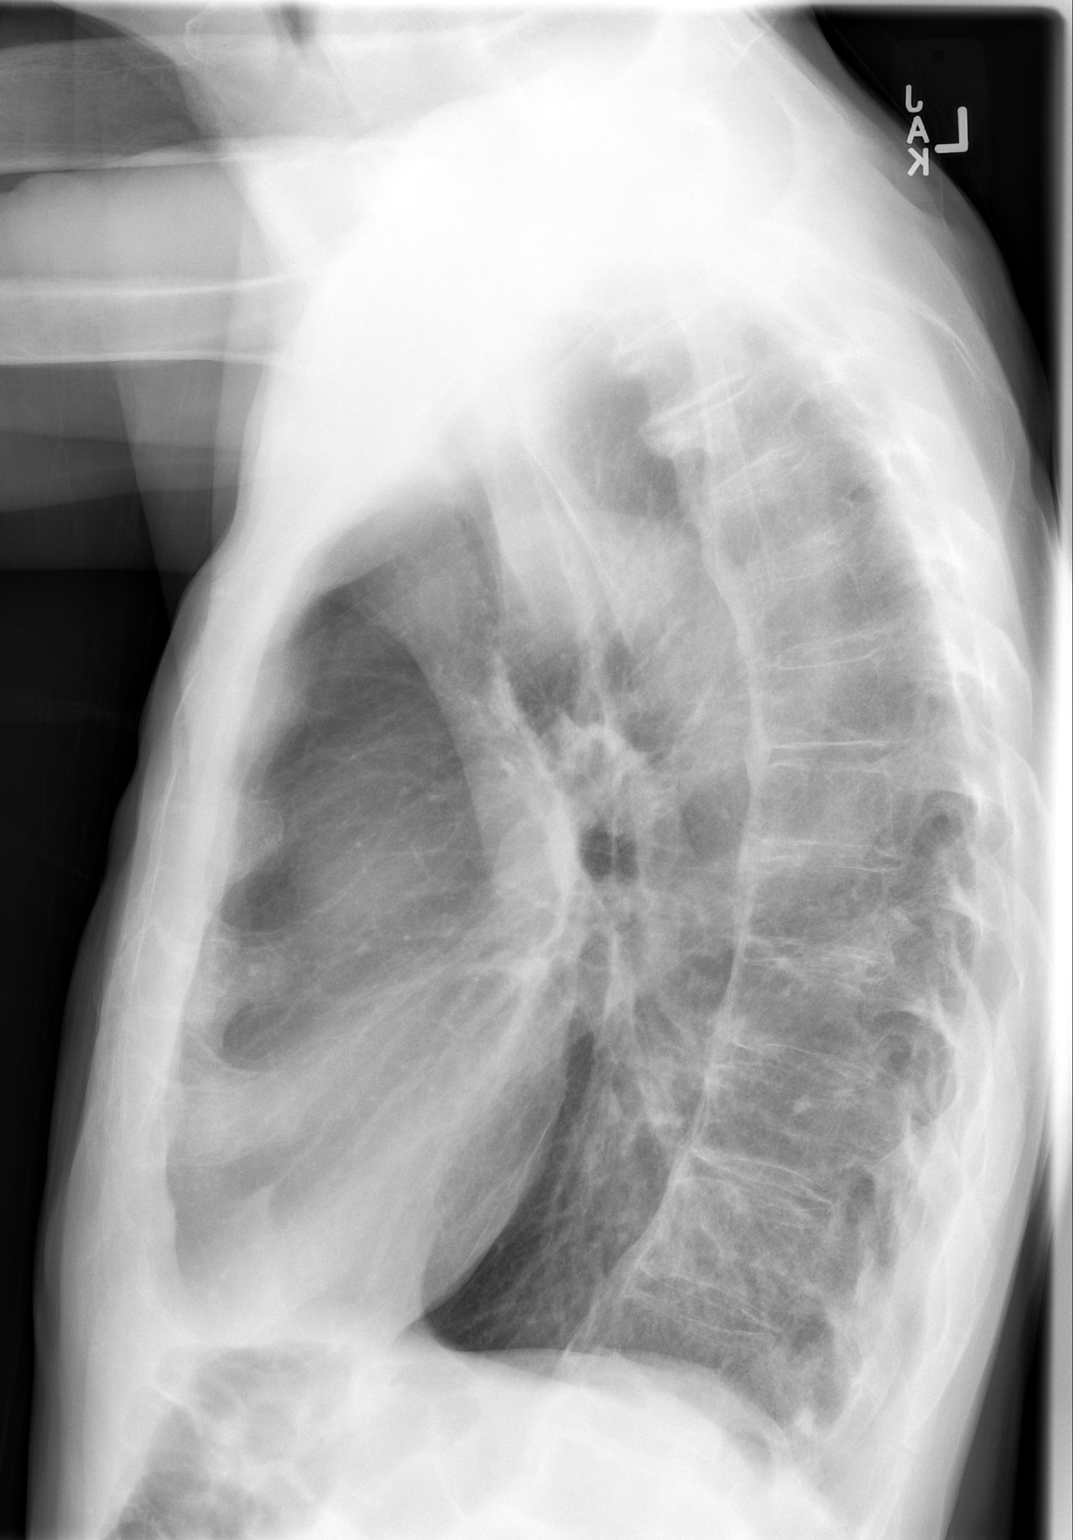

[2 of 2 positions shown; findings below may reference images not displayed]

FINDINGS: Diffuse idiopathic skeletal hyperostosis involving the thoracic
spine. Hyperinflation. Midline trachea. Normal heart size and
mediastinal contours for age. No pleural effusion or pneumothorax.
Left lower lobe airspace disease is resolved. Mild linear
retrocardiac scarring remains.
IMPRESSION: Hyperinflation, with resolution of left lower lobe airspace disease.

## 2017-05-18 DIAGNOSIS — I4510 Unspecified right bundle-branch block: Secondary | ICD-10-CM | POA: Diagnosis not present

## 2017-05-18 DIAGNOSIS — S72142D Displaced intertrochanteric fracture of left femur, subsequent encounter for closed fracture with routine healing: Secondary | ICD-10-CM | POA: Diagnosis not present

## 2017-05-18 DIAGNOSIS — K922 Gastrointestinal hemorrhage, unspecified: Secondary | ICD-10-CM | POA: Diagnosis not present

## 2017-05-18 DIAGNOSIS — G40209 Localization-related (focal) (partial) symptomatic epilepsy and epileptic syndromes with complex partial seizures, not intractable, without status epilepticus: Secondary | ICD-10-CM | POA: Diagnosis not present

## 2017-05-18 DIAGNOSIS — M353 Polymyalgia rheumatica: Secondary | ICD-10-CM | POA: Diagnosis not present

## 2017-05-18 DIAGNOSIS — D500 Iron deficiency anemia secondary to blood loss (chronic): Secondary | ICD-10-CM | POA: Diagnosis not present

## 2017-05-19 ENCOUNTER — Ambulatory Visit (INDEPENDENT_AMBULATORY_CARE_PROVIDER_SITE_OTHER): Payer: Medicare Other | Admitting: Internal Medicine

## 2017-05-19 ENCOUNTER — Telehealth: Payer: Self-pay | Admitting: Neurology

## 2017-05-19 DIAGNOSIS — K922 Gastrointestinal hemorrhage, unspecified: Secondary | ICD-10-CM | POA: Diagnosis not present

## 2017-05-19 DIAGNOSIS — M353 Polymyalgia rheumatica: Secondary | ICD-10-CM | POA: Diagnosis not present

## 2017-05-19 DIAGNOSIS — Z5181 Encounter for therapeutic drug level monitoring: Secondary | ICD-10-CM | POA: Diagnosis not present

## 2017-05-19 DIAGNOSIS — I4510 Unspecified right bundle-branch block: Secondary | ICD-10-CM | POA: Diagnosis not present

## 2017-05-19 DIAGNOSIS — S72142D Displaced intertrochanteric fracture of left femur, subsequent encounter for closed fracture with routine healing: Secondary | ICD-10-CM | POA: Diagnosis not present

## 2017-05-19 DIAGNOSIS — D500 Iron deficiency anemia secondary to blood loss (chronic): Secondary | ICD-10-CM | POA: Diagnosis not present

## 2017-05-19 DIAGNOSIS — G40209 Localization-related (focal) (partial) symptomatic epilepsy and epileptic syndromes with complex partial seizures, not intractable, without status epilepticus: Secondary | ICD-10-CM | POA: Diagnosis not present

## 2017-05-19 LAB — POCT INR: INR: 2.3

## 2017-05-19 NOTE — Patient Instructions (Signed)
Description   Spoke with Amy [facility] nurse and advised to have pt continue taking 1 tablet every day except 2 tablets on Tuesdays and Saturdays. Recheck INR in 1 week.  Pt Prednisone dose increased to 10mg  daily except 15mg  on MWF per [facility] RN. Amy filled pt's pill box while in home. Last [place] visit week of 3/4. Daughter Shepard General # [phone].

## 2017-05-19 NOTE — Telephone Encounter (Signed)
Spoke with Amy.  She states that pt's recent Dilantin levels were at 28.7 - Dr. Felipa Eth lowered his Dilantin to 50mg  chewable tablets; 2AM, 2Noon, 1Bedtime for a total of 250mg /day.  No changes were made to his Keppra.  States that as far as she knows, pt has not had any recent seizures.  Amy will send lab results to our office.  Amy states that pt has not yet started lower dose.  I told Amy that I did not see the lower dose being too much of an issue, also strongly encouraged Amy to call our office if pt has seizures.  Amy is concerned that pt requires more supervision that they are able to offer - his daughter lives in [place] - and although pt is good about taking his medications she feels that this switch (along with a change in his Prednisone and Coumadin) pt may become more confused.

## 2017-05-19 NOTE — Telephone Encounter (Signed)
I had previously discussed with daughter to call our office before any med changes were done, especially with his Dilantin. Was he having any seizures? So how he is taking Dilantin now, when I last saw him he was on 300mg  daily, as well as Keppra.

## 2017-05-19 NOTE — Telephone Encounter (Signed)
[name] called from [place] care and needs to let Dr. [name] know that Dr. Felipa Eth changed the patient's Dilantin dosage to chewable 50 mg tablets. The patient's Dilantin levels were at 28.7. The daughter was not happy with that. She felt only the Neurologist should do that. Please Call. Thanks

## 2017-05-20 NOTE — Telephone Encounter (Signed)
Both I and [name] who had previously been seeing him had discussed this concern multiple times with him and his daughter. He is coming on 3/4, will discuss this again. Continue on the medication changes noted above. Thanks

## 2017-05-23 ENCOUNTER — Other Ambulatory Visit: Payer: Medicare Other

## 2017-05-23 ENCOUNTER — Encounter: Payer: Self-pay | Admitting: Neurology

## 2017-05-23 ENCOUNTER — Ambulatory Visit (INDEPENDENT_AMBULATORY_CARE_PROVIDER_SITE_OTHER): Payer: Medicare Other | Admitting: Neurology

## 2017-05-23 VITALS — BP 110/60 | HR 80 | Ht 72.0 in | Wt 126.0 lb

## 2017-05-23 DIAGNOSIS — G40209 Localization-related (focal) (partial) symptomatic epilepsy and epileptic syndromes with complex partial seizures, not intractable, without status epilepticus: Secondary | ICD-10-CM

## 2017-05-23 DIAGNOSIS — F0390 Unspecified dementia without behavioral disturbance: Secondary | ICD-10-CM

## 2017-05-23 DIAGNOSIS — F03A0 Unspecified dementia, mild, without behavioral disturbance, psychotic disturbance, mood disturbance, and anxiety: Secondary | ICD-10-CM

## 2017-05-23 MED ORDER — LEVETIRACETAM 500 MG PO TABS: ORAL_TABLET | ORAL | 11 refills | Status: DC

## 2017-05-23 MED ORDER — PHENYTOIN SODIUM EXTENDED 100 MG PO CAPS: 300.0000 mg | ORAL_CAPSULE | Freq: Every day | ORAL | 11 refills | Status: DC
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
## 2017-05-23 NOTE — Patient Instructions (Addendum)
1. Continue Levetiracetam 500mg : Take 3 tablets twice a day 2. Continue Phenytoin 100mg : Take 3 tablets once a day 3. Bloodwork for Keppra and Dilantin levels. Your provider has requested that you have labwork completed today. Please go to [facility] ([address]) on the second floor of this building before leaving the office today. You do not need to check in. If you are not called within 15 minutes please check with the front desk.  4. Use walker at all times 5. Continue with getting more help at home 6. Follow-up in 6 months, call for any changes  Seizure Precautions: 1. If medication has been prescribed for you to prevent seizures, take it exactly as directed.  Do not stop taking the medicine without talking to your doctor first, even if you have not had a seizure in a long time.   2. Avoid activities in which a seizure would cause danger to yourself or to others.  Don't operate dangerous machinery, swim alone, or climb in high or dangerous places, such as on ladders, roofs, or girders.  Do not drive unless your doctor says you may.  3. If you have any warning that you may have a seizure, lay down in a safe place where you can't hurt yourself.    4.  No driving for 6 months from last seizure, as per [facility].   Please refer to the following link on the [place] website for more information: http://[email or web address]/[email or web address]/[email or web address]   5.  Maintain good sleep hygiene. Avoid alcohol.  6.  Contact your doctor if you have any problems that may be related to the medicine you are taking.  7.  Call 911 and bring the patient back to the ED if:        A.  The seizure lasts longer than 5 minutes.       B.  The patient doesn't awaken shortly after the seizure  C.  The patient has new problems such as difficulty seeing, speaking or moving  D.  The patient was injured during the seizure  E.  The patient has a temperature  over 102 F (39C)  F.  The patient vomited and now is having trouble breathing

## 2017-05-25 ENCOUNTER — Ambulatory Visit (INDEPENDENT_AMBULATORY_CARE_PROVIDER_SITE_OTHER): Payer: Medicare Other | Admitting: Cardiovascular Disease

## 2017-05-25 DIAGNOSIS — I4892 Unspecified atrial flutter: Secondary | ICD-10-CM

## 2017-05-25 DIAGNOSIS — G40209 Localization-related (focal) (partial) symptomatic epilepsy and epileptic syndromes with complex partial seizures, not intractable, without status epilepticus: Secondary | ICD-10-CM | POA: Diagnosis not present

## 2017-05-25 DIAGNOSIS — Z5181 Encounter for therapeutic drug level monitoring: Secondary | ICD-10-CM

## 2017-05-25 DIAGNOSIS — K922 Gastrointestinal hemorrhage, unspecified: Secondary | ICD-10-CM | POA: Diagnosis not present

## 2017-05-25 DIAGNOSIS — I4510 Unspecified right bundle-branch block: Secondary | ICD-10-CM | POA: Diagnosis not present

## 2017-05-25 DIAGNOSIS — M353 Polymyalgia rheumatica: Secondary | ICD-10-CM | POA: Diagnosis not present

## 2017-05-25 DIAGNOSIS — D500 Iron deficiency anemia secondary to blood loss (chronic): Secondary | ICD-10-CM | POA: Diagnosis not present

## 2017-05-25 DIAGNOSIS — S72142D Displaced intertrochanteric fracture of left femur, subsequent encounter for closed fracture with routine healing: Secondary | ICD-10-CM | POA: Diagnosis not present

## 2017-05-25 LAB — LEVETIRACETAM LEVEL: Keppra (Levetiracetam): 19.8 ug/mL

## 2017-05-25 LAB — POCT INR: INR: 2.2

## 2017-05-25 LAB — PHENYTOIN LEVEL, TOTAL: Phenytoin, Total: 27.4 mg/L — ABNORMAL HIGH (ref 10.0–20.0)

## 2017-05-25 NOTE — Patient Instructions (Signed)
Description   Spoke with Amy [facility] nurse and advised to have pt continue taking 1 tablet every day except 2 tablets on Tuesdays and Saturdays. Recheck INR in 3 weeks. Pt Prednisone dose continued to 10mg  daily except 15mg  on MWF per [facility] RN. Amy filled pt's pill box while in home. Last [place] visit week of 05/25/17. Daughter Shepard General # [phone].

## 2017-05-31 ENCOUNTER — Other Ambulatory Visit: Payer: Self-pay | Admitting: *Deleted

## 2017-05-31 NOTE — Patient Outreach (Signed)
[facility]) Care Management  05/31/2017  [name] [facility] [birth year] [id number]   Referral received 3/11 Referral source: Dr.Stoneking office Referral reason : Needs assistance at home after home health DC   Placed call to patient, HIPAA information verified,explained purpose of the  call. Patient states he does not have time to talk at this right now , he is agreeable to return call on next day.   Plan RN will schedule return call on the next day.    Joylene Draft, RN, [facility] Management Coordinator  [phone]- Mobile [phone]- Toll Free Main Office

## 2017-06-01 ENCOUNTER — Ambulatory Visit: Payer: Medicare Other | Admitting: Neurology

## 2017-06-01 ENCOUNTER — Encounter: Payer: Self-pay | Admitting: *Deleted

## 2017-06-01 ENCOUNTER — Other Ambulatory Visit: Payer: Self-pay | Admitting: *Deleted

## 2017-06-01 NOTE — Patient Outreach (Signed)
[place] [facility]) Care Management  06/01/2017  Hodge Stachnik [facility] 1940/06/13 [id number]   Telephone screening call   Referral received 3/11 Referral source: Dr.Stoneking office Referral reason : Needs assistance at home after home health DC  1033 Return Outreach call to patient as requested on yesterday,  unsuccessful , able to leave a HIPAA compliant message for return call .  Return call from patient, HIPAA information verified,began to  explain reason for the call, patient states "we went through this on yesterday, I am fine I don't need anything".   Patient has declined further assessment of needs and [facility] care management services.   Plan RNCM will send CMA in basket message regarding case closure as patient has declined services.  Will send outreach letter., will send PCP letter of patient decline of services.    Joylene Draft, RN, [place] Management Coordinator  [phone]- Mobile [phone]- Toll Free Main Office

## 2017-06-15 ENCOUNTER — Ambulatory Visit (INDEPENDENT_AMBULATORY_CARE_PROVIDER_SITE_OTHER): Payer: Medicare Other | Admitting: *Deleted

## 2017-06-15 DIAGNOSIS — I4892 Unspecified atrial flutter: Secondary | ICD-10-CM

## 2017-06-15 DIAGNOSIS — Z5181 Encounter for therapeutic drug level monitoring: Secondary | ICD-10-CM | POA: Diagnosis not present

## 2017-06-15 LAB — POCT INR: INR: 1.5

## 2017-06-15 NOTE — Patient Instructions (Addendum)
Description   Today take 2 tablets, then  continue taking 1 tablet every day except 2 tablets on Tuesdays and Saturdays. Recheck INR in 2 weeks. Prednisone dose continued to 10mg  daily except 15mg  on MWF.

## 2017-06-30 ENCOUNTER — Ambulatory Visit (INDEPENDENT_AMBULATORY_CARE_PROVIDER_SITE_OTHER): Payer: Medicare Other

## 2017-06-30 DIAGNOSIS — Z5181 Encounter for therapeutic drug level monitoring: Secondary | ICD-10-CM

## 2017-06-30 DIAGNOSIS — I4892 Unspecified atrial flutter: Secondary | ICD-10-CM | POA: Diagnosis not present

## 2017-06-30 LAB — POCT INR: INR: 1.7

## 2017-06-30 NOTE — Patient Instructions (Signed)
Description   Start taking 1 tablet every day except 2 tablets on Tuesdays, Thursdays and Saturdays. Recheck INR in 2 weeks. Prednisone dose continued to 10mg  daily except 15mg  on MWF.

## 2017-07-01 DIAGNOSIS — E46 Unspecified protein-calorie malnutrition: Secondary | ICD-10-CM | POA: Diagnosis not present

## 2017-07-01 DIAGNOSIS — I10 Essential (primary) hypertension: Secondary | ICD-10-CM | POA: Diagnosis not present

## 2017-07-01 DIAGNOSIS — D649 Anemia, unspecified: Secondary | ICD-10-CM | POA: Diagnosis not present

## 2017-07-01 DIAGNOSIS — M545 Low back pain: Secondary | ICD-10-CM | POA: Diagnosis not present

## 2017-07-01 DIAGNOSIS — G8929 Other chronic pain: Secondary | ICD-10-CM | POA: Diagnosis not present

## 2017-07-14 ENCOUNTER — Ambulatory Visit (INDEPENDENT_AMBULATORY_CARE_PROVIDER_SITE_OTHER): Payer: Medicare Other | Admitting: *Deleted

## 2017-07-14 DIAGNOSIS — Z5181 Encounter for therapeutic drug level monitoring: Secondary | ICD-10-CM

## 2017-07-14 LAB — POCT INR: INR: 2.2

## 2017-07-14 NOTE — Patient Instructions (Signed)
Description   Continue  taking same dose of coumadin 1 tablet every day except 2 tablets on Tuesdays, Thursdays and Saturdays. Recheck INR in 3 weeks. Prednisone dose continued to 10mg  daily except 15mg  on MWF.

## 2017-08-05 ENCOUNTER — Ambulatory Visit (INDEPENDENT_AMBULATORY_CARE_PROVIDER_SITE_OTHER): Payer: Medicare Other | Admitting: *Deleted

## 2017-08-05 DIAGNOSIS — Z5181 Encounter for therapeutic drug level monitoring: Secondary | ICD-10-CM

## 2017-08-05 LAB — POCT INR: INR: 2.3

## 2017-08-05 MED ORDER — WARFARIN SODIUM 2.5 MG PO TABS: ORAL_TABLET | ORAL | 3 refills | Status: DC

## 2017-08-05 NOTE — Patient Instructions (Signed)
Description   Continue taking same dose of coumadin 1 tablet every day except 2 tablets on Tuesdays, Thursdays and Saturdays. Recheck INR in 4 weeks. Prednisone dose continued to 10mg daily except 15mg on MWF.      

## 2017-09-01 ENCOUNTER — Ambulatory Visit (INDEPENDENT_AMBULATORY_CARE_PROVIDER_SITE_OTHER): Payer: Medicare Other | Admitting: *Deleted

## 2017-09-01 DIAGNOSIS — Z5181 Encounter for therapeutic drug level monitoring: Secondary | ICD-10-CM | POA: Diagnosis not present

## 2017-09-01 DIAGNOSIS — I4892 Unspecified atrial flutter: Secondary | ICD-10-CM

## 2017-09-01 LAB — POCT INR: INR: 2.6 (ref 2.0–3.0)

## 2017-09-01 NOTE — Patient Instructions (Signed)
Description   Continue  taking same dose of coumadin 1 tablet every day except 2 tablets on Tuesdays, Thursdays and Saturdays. Recheck INR in 6 weeks. Prednisone dose continued to 10mg  daily except 15mg  on MWF.

## 2017-09-30 DIAGNOSIS — E46 Unspecified protein-calorie malnutrition: Secondary | ICD-10-CM | POA: Diagnosis not present

## 2017-09-30 DIAGNOSIS — I10 Essential (primary) hypertension: Secondary | ICD-10-CM | POA: Diagnosis not present

## 2017-10-13 ENCOUNTER — Ambulatory Visit (INDEPENDENT_AMBULATORY_CARE_PROVIDER_SITE_OTHER): Payer: Medicare Other | Admitting: *Deleted

## 2017-10-13 DIAGNOSIS — Z5181 Encounter for therapeutic drug level monitoring: Secondary | ICD-10-CM | POA: Diagnosis not present

## 2017-10-13 DIAGNOSIS — I4892 Unspecified atrial flutter: Secondary | ICD-10-CM | POA: Diagnosis not present

## 2017-10-13 LAB — POCT INR: INR: 1.9 — AB (ref 2.0–3.0)

## 2017-10-13 NOTE — Patient Instructions (Signed)
Description   Today take 2.5 tablets then continue taking same dose of coumadin 1 tablet every day except 2 tablets on Tuesdays, Thursdays and Saturdays. Recheck INR in 4 weeks. Prednisone dose continued to 10mg  daily except 15mg  on MWF.

## 2017-11-10 ENCOUNTER — Ambulatory Visit (INDEPENDENT_AMBULATORY_CARE_PROVIDER_SITE_OTHER): Payer: Medicare Other

## 2017-11-10 DIAGNOSIS — Z5181 Encounter for therapeutic drug level monitoring: Secondary | ICD-10-CM

## 2017-11-10 DIAGNOSIS — I4892 Unspecified atrial flutter: Secondary | ICD-10-CM | POA: Diagnosis not present

## 2017-11-10 LAB — POCT INR: INR: 2.5 (ref 2.0–3.0)

## 2017-11-10 NOTE — Patient Instructions (Signed)
Description   Continue taking same dose of coumadin 1 tablet every day except 2 tablets on Tuesdays, Thursdays and Saturdays. Recheck INR in 4 weeks. Prednisone dose continued to 10mg  daily except 15mg  on MWF.

## 2017-12-02 ENCOUNTER — Ambulatory Visit: Payer: Medicare Other | Admitting: Neurology

## 2017-12-22 ENCOUNTER — Ambulatory Visit (INDEPENDENT_AMBULATORY_CARE_PROVIDER_SITE_OTHER): Payer: Medicare Other | Admitting: *Deleted

## 2017-12-22 DIAGNOSIS — I4892 Unspecified atrial flutter: Secondary | ICD-10-CM

## 2017-12-22 DIAGNOSIS — Z5181 Encounter for therapeutic drug level monitoring: Secondary | ICD-10-CM

## 2017-12-22 LAB — POCT INR: INR: 1.8 — AB (ref 2.0–3.0)

## 2017-12-22 NOTE — Patient Instructions (Signed)
Description   Today take 3 tablets, then continue taking same dose of coumadin 1 tablet every day except 2 tablets on Tuesdays, Thursdays and Saturdays. Recheck INR in 4 weeks. Prednisone dose continued to 10mg  daily except 15mg  on Mondays, Wednesdays and Fridays, call us with this dose changes.

## 2018-01-11 DIAGNOSIS — G40909 Epilepsy, unspecified, not intractable, without status epilepticus: Secondary | ICD-10-CM | POA: Diagnosis not present

## 2018-01-11 DIAGNOSIS — I10 Essential (primary) hypertension: Secondary | ICD-10-CM | POA: Diagnosis not present

## 2018-01-11 DIAGNOSIS — Z79899 Other long term (current) drug therapy: Secondary | ICD-10-CM | POA: Diagnosis not present

## 2018-01-11 DIAGNOSIS — K5641 Fecal impaction: Secondary | ICD-10-CM | POA: Diagnosis not present

## 2018-01-11 DIAGNOSIS — M353 Polymyalgia rheumatica: Secondary | ICD-10-CM | POA: Diagnosis not present

## 2018-01-12 ENCOUNTER — Other Ambulatory Visit: Payer: Self-pay | Admitting: Geriatric Medicine

## 2018-01-12 DIAGNOSIS — Z79899 Other long term (current) drug therapy: Secondary | ICD-10-CM

## 2018-01-18 ENCOUNTER — Other Ambulatory Visit: Payer: Medicare Other

## 2018-01-19 ENCOUNTER — Ambulatory Visit
Admission: RE | Admit: 2018-01-19 | Discharge: 2018-01-19 | Disposition: A | Discharge status: Home / Self Care | Payer: Medicare Other | Source: Ambulatory Visit | Attending: Geriatric Medicine | Admitting: Geriatric Medicine

## 2018-01-19 ENCOUNTER — Other Ambulatory Visit: Payer: Self-pay | Admitting: Geriatric Medicine

## 2018-01-19 ENCOUNTER — Ambulatory Visit (INDEPENDENT_AMBULATORY_CARE_PROVIDER_SITE_OTHER): Payer: Medicare Other | Admitting: *Deleted

## 2018-01-19 DIAGNOSIS — G40909 Epilepsy, unspecified, not intractable, without status epilepticus: Secondary | ICD-10-CM | POA: Diagnosis not present

## 2018-01-19 DIAGNOSIS — M25551 Pain in right hip: Secondary | ICD-10-CM

## 2018-01-19 DIAGNOSIS — R413 Other amnesia: Secondary | ICD-10-CM | POA: Diagnosis not present

## 2018-01-19 DIAGNOSIS — R350 Frequency of micturition: Secondary | ICD-10-CM | POA: Diagnosis not present

## 2018-01-19 DIAGNOSIS — Z79899 Other long term (current) drug therapy: Secondary | ICD-10-CM | POA: Diagnosis not present

## 2018-01-19 DIAGNOSIS — Z5181 Encounter for therapeutic drug level monitoring: Secondary | ICD-10-CM

## 2018-01-19 DIAGNOSIS — I10 Essential (primary) hypertension: Secondary | ICD-10-CM | POA: Diagnosis not present

## 2018-01-19 DIAGNOSIS — I4892 Unspecified atrial flutter: Secondary | ICD-10-CM

## 2018-01-19 LAB — POCT INR: INR: 2.5 (ref 2.0–3.0)

## 2018-01-19 NOTE — Patient Instructions (Signed)
Description   Continue taking same dose of coumadin 1 tablet every day except 2 tablets on Tuesdays, Thursdays and Saturdays. Recheck INR in 5 weeks. Prednisone dose continued to 10mg  daily except 15mg  on Mondays, Wednesdays and Fridays, call us with this dose changes.

## 2018-02-10 DIAGNOSIS — I10 Essential (primary) hypertension: Secondary | ICD-10-CM | POA: Diagnosis not present

## 2018-02-10 DIAGNOSIS — G40909 Epilepsy, unspecified, not intractable, without status epilepticus: Secondary | ICD-10-CM | POA: Diagnosis not present

## 2018-02-10 DIAGNOSIS — R269 Unspecified abnormalities of gait and mobility: Secondary | ICD-10-CM | POA: Diagnosis not present

## 2018-02-10 DIAGNOSIS — L989 Disorder of the skin and subcutaneous tissue, unspecified: Secondary | ICD-10-CM | POA: Diagnosis not present

## 2018-02-10 DIAGNOSIS — E46 Unspecified protein-calorie malnutrition: Secondary | ICD-10-CM | POA: Diagnosis not present

## 2018-02-10 DIAGNOSIS — Z1389 Encounter for screening for other disorder: Secondary | ICD-10-CM | POA: Diagnosis not present

## 2018-02-10 DIAGNOSIS — Z0000 Encounter for general adult medical examination without abnormal findings: Secondary | ICD-10-CM | POA: Diagnosis not present

## 2018-02-17 ENCOUNTER — Other Ambulatory Visit: Payer: Medicare Other

## 2018-02-23 ENCOUNTER — Ambulatory Visit (INDEPENDENT_AMBULATORY_CARE_PROVIDER_SITE_OTHER): Payer: Medicare Other

## 2018-02-23 DIAGNOSIS — I4892 Unspecified atrial flutter: Secondary | ICD-10-CM

## 2018-02-23 DIAGNOSIS — Z5181 Encounter for therapeutic drug level monitoring: Secondary | ICD-10-CM | POA: Diagnosis not present

## 2018-02-23 LAB — POCT INR: INR: 1.4 — AB (ref 2.0–3.0)

## 2018-02-23 NOTE — Patient Instructions (Signed)
Description   Take 3 tablets of Coumadin today, then take 2 tablets tomorrow, then resume same dosage 1 tablet every day except 2 tablets on Tuesdays, Thursdays and Saturdays. Recheck INR in 2 weeks. Prednisone dose continued to 10mg  daily except 15mg  on Mondays, Wednesdays and Fridays, call us with this dose changes.

## 2018-03-09 ENCOUNTER — Ambulatory Visit (INDEPENDENT_AMBULATORY_CARE_PROVIDER_SITE_OTHER): Payer: Medicare Other | Admitting: *Deleted

## 2018-03-09 DIAGNOSIS — Z5181 Encounter for therapeutic drug level monitoring: Secondary | ICD-10-CM | POA: Diagnosis not present

## 2018-03-09 DIAGNOSIS — I4892 Unspecified atrial flutter: Secondary | ICD-10-CM

## 2018-03-09 LAB — POCT INR: INR: 3.2 — AB (ref 2.0–3.0)

## 2018-03-09 NOTE — Patient Instructions (Signed)
Description   Take 1 tablets of Coumadin today,  then resume same dosage 1 tablet every day except 2 tablets on Tuesdays, Thursdays and Saturdays. Recheck INR in 2 weeks. Prednisone dose continued to 10mg  daily except 15mg  on Mondays, Wednesdays and Fridays, call us with this dose changes.

## 2018-03-27 ENCOUNTER — Ambulatory Visit (INDEPENDENT_AMBULATORY_CARE_PROVIDER_SITE_OTHER): Payer: Medicare Other | Admitting: Pharmacist

## 2018-03-27 DIAGNOSIS — Z5181 Encounter for therapeutic drug level monitoring: Secondary | ICD-10-CM | POA: Diagnosis not present

## 2018-03-27 DIAGNOSIS — I4892 Unspecified atrial flutter: Secondary | ICD-10-CM | POA: Diagnosis not present

## 2018-03-27 LAB — POCT INR: INR: 1.9 — AB (ref 2.0–3.0)

## 2018-03-27 NOTE — Patient Instructions (Signed)
Description   Take 2 tablets today, then continue taking 1 tablet every day except 2 tablets on Tuesdays, Thursdays and Saturdays. Recheck INR in 3 weeks. Prednisone dose continued to 10mg  daily except 15mg  on Mondays, Wednesdays and Fridays, call us with this dose changes.

## 2018-04-17 ENCOUNTER — Ambulatory Visit (INDEPENDENT_AMBULATORY_CARE_PROVIDER_SITE_OTHER): Payer: Medicare Other

## 2018-04-17 DIAGNOSIS — I4892 Unspecified atrial flutter: Secondary | ICD-10-CM | POA: Diagnosis not present

## 2018-04-17 DIAGNOSIS — Z5181 Encounter for therapeutic drug level monitoring: Secondary | ICD-10-CM

## 2018-04-17 LAB — POCT INR: INR: 1.9 — AB (ref 2.0–3.0)

## 2018-04-17 MED ORDER — WARFARIN SODIUM 2.5 MG PO TABS: ORAL_TABLET | ORAL | 3 refills | Status: DC

## 2018-04-17 NOTE — Patient Instructions (Signed)
Description   Take 2 tablets today, then start taking 2 tablets daily except 1 tablet on Mondays, Wednesdays, and Fridays. Recheck INR in 3 weeks. Prednisone dose continued to 10mg  daily except 15mg  on Mondays, Wednesdays and Fridays, call us with this dose changes.

## 2018-05-09 ENCOUNTER — Ambulatory Visit (INDEPENDENT_AMBULATORY_CARE_PROVIDER_SITE_OTHER): Payer: Medicare Other | Admitting: *Deleted

## 2018-05-09 DIAGNOSIS — I4892 Unspecified atrial flutter: Secondary | ICD-10-CM | POA: Diagnosis not present

## 2018-05-09 DIAGNOSIS — Z5181 Encounter for therapeutic drug level monitoring: Secondary | ICD-10-CM | POA: Diagnosis not present

## 2018-05-09 LAB — POCT INR: INR: 2.6 (ref 2.0–3.0)

## 2018-05-09 NOTE — Patient Instructions (Signed)
Description   Continue taking 2 tablets daily except 1 tablet on Mondays, Wednesdays, and Fridays. Recheck INR in 4 weeks. Prednisone dose continued to 10mg  daily except 15mg  on Mondays, Wednesdays and Fridays, call us with this dose changes.

## 2018-06-07 ENCOUNTER — Other Ambulatory Visit: Payer: Self-pay

## 2018-06-07 ENCOUNTER — Ambulatory Visit (INDEPENDENT_AMBULATORY_CARE_PROVIDER_SITE_OTHER): Payer: Medicare Other | Admitting: *Deleted

## 2018-06-07 DIAGNOSIS — Z5181 Encounter for therapeutic drug level monitoring: Secondary | ICD-10-CM | POA: Diagnosis not present

## 2018-06-07 DIAGNOSIS — I4892 Unspecified atrial flutter: Secondary | ICD-10-CM | POA: Diagnosis not present

## 2018-06-07 LAB — POCT INR: INR: 2.8 (ref 2.0–3.0)

## 2018-06-07 NOTE — Patient Instructions (Addendum)
Description   Continue taking 2 tablets daily except 1 tablet on Mondays, Wednesdays, and Fridays. Recheck INR in 5 weeks. Prednisone dose continued to 10mg  daily except 15mg  on Mondays, Wednesdays and Fridays, call us with this dose changes. [phone]

## 2018-06-12 ENCOUNTER — Other Ambulatory Visit: Payer: Self-pay | Admitting: Neurology

## 2018-06-12 DIAGNOSIS — G40209 Localization-related (focal) (partial) symptomatic epilepsy and epileptic syndromes with complex partial seizures, not intractable, without status epilepticus: Secondary | ICD-10-CM

## 2018-06-14 ENCOUNTER — Other Ambulatory Visit: Payer: Self-pay | Admitting: Neurology

## 2018-06-14 DIAGNOSIS — G40209 Localization-related (focal) (partial) symptomatic epilepsy and epileptic syndromes with complex partial seizures, not intractable, without status epilepticus: Secondary | ICD-10-CM

## 2018-06-26 DIAGNOSIS — M353 Polymyalgia rheumatica: Secondary | ICD-10-CM | POA: Diagnosis not present

## 2018-06-26 DIAGNOSIS — R110 Nausea: Secondary | ICD-10-CM | POA: Diagnosis not present

## 2018-06-26 DIAGNOSIS — G40909 Epilepsy, unspecified, not intractable, without status epilepticus: Secondary | ICD-10-CM | POA: Diagnosis not present

## 2018-07-08 ENCOUNTER — Other Ambulatory Visit: Payer: Self-pay | Admitting: Internal Medicine

## 2018-07-10 ENCOUNTER — Encounter: Payer: Self-pay | Admitting: Neurology

## 2018-07-10 DIAGNOSIS — K5901 Slow transit constipation: Secondary | ICD-10-CM | POA: Diagnosis not present

## 2018-07-10 DIAGNOSIS — R110 Nausea: Secondary | ICD-10-CM | POA: Diagnosis not present

## 2018-07-10 DIAGNOSIS — M353 Polymyalgia rheumatica: Secondary | ICD-10-CM | POA: Diagnosis not present

## 2018-07-10 DIAGNOSIS — D509 Iron deficiency anemia, unspecified: Secondary | ICD-10-CM | POA: Diagnosis not present

## 2018-07-10 DIAGNOSIS — I10 Essential (primary) hypertension: Secondary | ICD-10-CM | POA: Diagnosis not present

## 2018-07-18 ENCOUNTER — Ambulatory Visit: Payer: Medicare Other | Admitting: Neurology

## 2018-07-18 ENCOUNTER — Encounter

## 2018-07-19 ENCOUNTER — Telehealth: Payer: Self-pay

## 2018-07-19 NOTE — Telephone Encounter (Signed)

## 2018-07-20 ENCOUNTER — Telehealth: Payer: Self-pay | Admitting: Internal Medicine

## 2018-07-20 ENCOUNTER — Ambulatory Visit (INDEPENDENT_AMBULATORY_CARE_PROVIDER_SITE_OTHER): Payer: Medicare Other | Admitting: *Deleted

## 2018-07-20 ENCOUNTER — Other Ambulatory Visit: Payer: Self-pay

## 2018-07-20 DIAGNOSIS — I4892 Unspecified atrial flutter: Secondary | ICD-10-CM | POA: Diagnosis not present

## 2018-07-20 DIAGNOSIS — Z5181 Encounter for therapeutic drug level monitoring: Secondary | ICD-10-CM | POA: Diagnosis not present

## 2018-07-20 LAB — POCT INR: INR: 5.2 — AB (ref 2.0–3.0)

## 2018-07-20 NOTE — Telephone Encounter (Signed)
New Message   Patient's daughter calling stating her father had a high INR reading today and a nurse was suppose to call them back to give them instructions on what the patient should do.

## 2018-07-20 NOTE — Patient Instructions (Signed)
Description   Spoke with dtr-Kendra and instructed her to have the pt to hold then hold today and tomorrow's dose then continue taking 2 tablets daily except 1 tablet on Mondays, Wednesdays, and Fridays. Recheck INR in 1 week. Prednisone dose continued to 10mg  daily except 15mg  on Mondays, Wednesdays and Fridays, call us with this dose changes. [phone]

## 2018-07-20 NOTE — Telephone Encounter (Signed)
Called [name] the pt's dtr to address INR from today and she stated she had called a left a msg. Pt was seen in the drive-up clinic and was told we would call them regarding the INR since no computers are outside to dose. After speaking with the dtr [name] for 12 minutes or so, she was educated and the pt's INR was taken care of. Please refer to Anticoagulation Track for details.

## 2018-07-21 ENCOUNTER — Other Ambulatory Visit: Payer: Self-pay

## 2018-07-21 ENCOUNTER — Inpatient Hospital Stay (HOSPITAL_COMMUNITY)
Admission: EM | Admit: 2018-07-21 | Discharge: 2018-07-25 | DRG: 378 | Disposition: A | Discharge status: Home / Self Care | Payer: Medicare Other | Attending: Internal Medicine | Admitting: Internal Medicine

## 2018-07-21 ENCOUNTER — Emergency Department (HOSPITAL_COMMUNITY): Payer: Medicare Other

## 2018-07-21 ENCOUNTER — Encounter (HOSPITAL_COMMUNITY): Payer: Self-pay | Admitting: Emergency Medicine

## 2018-07-21 DIAGNOSIS — K922 Gastrointestinal hemorrhage, unspecified: Secondary | ICD-10-CM | POA: Diagnosis present

## 2018-07-21 DIAGNOSIS — Z7901 Long term (current) use of anticoagulants: Secondary | ICD-10-CM | POA: Diagnosis not present

## 2018-07-21 DIAGNOSIS — R339 Retention of urine, unspecified: Secondary | ICD-10-CM | POA: Diagnosis not present

## 2018-07-21 DIAGNOSIS — G40009 Localization-related (focal) (partial) idiopathic epilepsy and epileptic syndromes with seizures of localized onset, not intractable, without status epilepticus: Secondary | ICD-10-CM | POA: Diagnosis present

## 2018-07-21 DIAGNOSIS — K753 Granulomatous hepatitis, not elsewhere classified: Secondary | ICD-10-CM | POA: Diagnosis present

## 2018-07-21 DIAGNOSIS — I4891 Unspecified atrial fibrillation: Secondary | ICD-10-CM | POA: Diagnosis present

## 2018-07-21 DIAGNOSIS — F0390 Unspecified dementia without behavioral disturbance: Secondary | ICD-10-CM | POA: Diagnosis present

## 2018-07-21 DIAGNOSIS — Z8719 Personal history of other diseases of the digestive system: Secondary | ICD-10-CM

## 2018-07-21 DIAGNOSIS — K3189 Other diseases of stomach and duodenum: Secondary | ICD-10-CM | POA: Diagnosis present

## 2018-07-21 DIAGNOSIS — D62 Acute posthemorrhagic anemia: Secondary | ICD-10-CM | POA: Diagnosis not present

## 2018-07-21 DIAGNOSIS — I4892 Unspecified atrial flutter: Secondary | ICD-10-CM | POA: Diagnosis not present

## 2018-07-21 DIAGNOSIS — K5900 Constipation, unspecified: Secondary | ICD-10-CM | POA: Diagnosis present

## 2018-07-21 DIAGNOSIS — Z20828 Contact with and (suspected) exposure to other viral communicable diseases: Secondary | ICD-10-CM | POA: Diagnosis present

## 2018-07-21 DIAGNOSIS — K6389 Other specified diseases of intestine: Secondary | ICD-10-CM | POA: Diagnosis not present

## 2018-07-21 DIAGNOSIS — D6832 Hemorrhagic disorder due to extrinsic circulating anticoagulants: Secondary | ICD-10-CM | POA: Diagnosis present

## 2018-07-21 DIAGNOSIS — I4510 Unspecified right bundle-branch block: Secondary | ICD-10-CM | POA: Diagnosis present

## 2018-07-21 DIAGNOSIS — K921 Melena: Secondary | ICD-10-CM | POA: Diagnosis not present

## 2018-07-21 DIAGNOSIS — Z8679 Personal history of other diseases of the circulatory system: Secondary | ICD-10-CM

## 2018-07-21 DIAGNOSIS — R791 Abnormal coagulation profile: Secondary | ICD-10-CM | POA: Diagnosis not present

## 2018-07-21 DIAGNOSIS — D7389 Other diseases of spleen: Secondary | ICD-10-CM | POA: Diagnosis present

## 2018-07-21 DIAGNOSIS — G309 Alzheimer's disease, unspecified: Secondary | ICD-10-CM | POA: Diagnosis not present

## 2018-07-21 DIAGNOSIS — Z79899 Other long term (current) drug therapy: Secondary | ICD-10-CM

## 2018-07-21 DIAGNOSIS — G40909 Epilepsy, unspecified, not intractable, without status epilepticus: Secondary | ICD-10-CM

## 2018-07-21 DIAGNOSIS — R1084 Generalized abdominal pain: Secondary | ICD-10-CM | POA: Diagnosis not present

## 2018-07-21 DIAGNOSIS — I10 Essential (primary) hypertension: Secondary | ICD-10-CM | POA: Diagnosis present

## 2018-07-21 DIAGNOSIS — K219 Gastro-esophageal reflux disease without esophagitis: Secondary | ICD-10-CM | POA: Diagnosis present

## 2018-07-21 DIAGNOSIS — Z8673 Personal history of transient ischemic attack (TIA), and cerebral infarction without residual deficits: Secondary | ICD-10-CM

## 2018-07-21 DIAGNOSIS — I700 Atherosclerosis of aorta: Secondary | ICD-10-CM | POA: Diagnosis present

## 2018-07-21 DIAGNOSIS — K31811 Angiodysplasia of stomach and duodenum with bleeding: Principal | ICD-10-CM | POA: Diagnosis present

## 2018-07-21 DIAGNOSIS — F0280 Dementia in other diseases classified elsewhere without behavioral disturbance: Secondary | ICD-10-CM | POA: Diagnosis not present

## 2018-07-21 DIAGNOSIS — Z87891 Personal history of nicotine dependence: Secondary | ICD-10-CM

## 2018-07-21 DIAGNOSIS — K644 Residual hemorrhoidal skin tags: Secondary | ICD-10-CM | POA: Diagnosis present

## 2018-07-21 DIAGNOSIS — Z7952 Long term (current) use of systemic steroids: Secondary | ICD-10-CM

## 2018-07-21 DIAGNOSIS — R195 Other fecal abnormalities: Secondary | ICD-10-CM | POA: Diagnosis not present

## 2018-07-21 DIAGNOSIS — Z8249 Family history of ischemic heart disease and other diseases of the circulatory system: Secondary | ICD-10-CM

## 2018-07-21 DIAGNOSIS — E755 Other lipid storage disorders: Secondary | ICD-10-CM | POA: Diagnosis present

## 2018-07-21 DIAGNOSIS — K7689 Other specified diseases of liver: Secondary | ICD-10-CM | POA: Diagnosis not present

## 2018-07-21 DIAGNOSIS — H9190 Unspecified hearing loss, unspecified ear: Secondary | ICD-10-CM | POA: Diagnosis present

## 2018-07-21 DIAGNOSIS — J329 Chronic sinusitis, unspecified: Secondary | ICD-10-CM | POA: Diagnosis present

## 2018-07-21 DIAGNOSIS — T45515A Adverse effect of anticoagulants, initial encounter: Secondary | ICD-10-CM | POA: Diagnosis present

## 2018-07-21 DIAGNOSIS — K449 Diaphragmatic hernia without obstruction or gangrene: Secondary | ICD-10-CM | POA: Diagnosis present

## 2018-07-21 LAB — COMPREHENSIVE METABOLIC PANEL
ALT: 12 U/L (ref 0–44)
AST: 18 U/L (ref 15–41)
Albumin: 3.6 g/dL (ref 3.5–5.0)
Alkaline Phosphatase: 77 U/L (ref 38–126)
Anion gap: 7 (ref 5–15)
BUN: 14 mg/dL (ref 8–23)
CO2: 25 mmol/L (ref 22–32)
Calcium: 8.4 mg/dL — ABNORMAL LOW (ref 8.9–10.3)
Chloride: 107 mmol/L (ref 98–111)
Creatinine, Ser: 0.76 mg/dL (ref 0.61–1.24)
GFR calc Af Amer: >60 mL/min (ref >60)
GFR calc non Af Amer: >60 mL/min (ref >60)
Glucose, Bld: 103 mg/dL — ABNORMAL HIGH (ref 70–99)
Potassium: 3.9 mmol/L (ref 3.5–5.1)
Sodium: 139 mmol/L (ref 135–145)
Total Bilirubin: 0.2 mg/dL — ABNORMAL LOW (ref 0.3–1.2)
Total Protein: 5.9 g/dL — ABNORMAL LOW (ref 6.5–8.1)

## 2018-07-21 LAB — PROTIME-INR
INR: 4 — ABNORMAL HIGH (ref 0.8–1.2)
INR: 3.1 — ABNORMAL HIGH (ref 0.8–1.2)
Prothrombin Time: 38.3 seconds — ABNORMAL HIGH (ref 11.4–15.2)
Prothrombin Time: 31.6 seconds — ABNORMAL HIGH (ref 11.4–15.2)

## 2018-07-21 LAB — POC OCCULT BLOOD, ED: Fecal Occult Bld: POSITIVE — AB

## 2018-07-21 LAB — PREPARE RBC (CROSSMATCH)

## 2018-07-21 LAB — CBC
HCT: 16.2 % — ABNORMAL LOW (ref 39.0–52.0)
Hemoglobin: 4.7 g/dL — CL (ref 13.0–17.0)
MCH: 23 pg — ABNORMAL LOW (ref 26.0–34.0)
MCHC: 29 g/dL — ABNORMAL LOW (ref 30.0–36.0)
MCV: 79.4 fL — ABNORMAL LOW (ref 80.0–100.0)
Platelets: 197 10*3/uL (ref 150–400)
RBC: 2.04 MIL/uL — ABNORMAL LOW (ref 4.22–5.81)
RDW: 24.5 % — ABNORMAL HIGH (ref 11.5–15.5)
WBC: 4.9 10*3/uL (ref 4.0–10.5)
nRBC: 0.4 % — ABNORMAL HIGH (ref 0.0–0.2)

## 2018-07-21 LAB — LIPASE, BLOOD: Lipase: 26 U/L (ref 11–51)

## 2018-07-21 MED ORDER — ONDANSETRON HCL 4 MG/2ML IJ SOLN
4.0000 mg | Freq: Once | INTRAMUSCULAR | Status: AC
  Administered 2018-07-21: 4 mg via INTRAVENOUS
  Filled 2018-07-21: qty 2

## 2018-07-21 MED ORDER — SODIUM CHLORIDE 0.9 % IV BOLUS
1000.0000 mL | Freq: Once | INTRAVENOUS | Status: AC
  Administered 2018-07-21: 1000 mL via INTRAVENOUS

## 2018-07-21 MED ORDER — VITAMIN K1 10 MG/ML IJ SOLN
10.0000 mg | Freq: Once | INTRAVENOUS | Status: AC
  Administered 2018-07-21: 10 mg via INTRAVENOUS
  Filled 2018-07-21: qty 1

## 2018-07-21 MED ORDER — IOPAMIDOL (ISOVUE-300) INJECTION 61%
100.0000 mL | Freq: Once | INTRAVENOUS | Status: AC | PRN
  Administered 2018-07-21: 100 mL via INTRAVENOUS

## 2018-07-21 MED ORDER — SODIUM CHLORIDE 0.9 % IV SOLN
10.0000 mL/h | Freq: Once | INTRAVENOUS | Status: AC
  Administered 2018-07-21: 10 mL/h via INTRAVENOUS

## 2018-07-21 MED ORDER — SODIUM CHLORIDE 0.9% FLUSH
3.0000 mL | Freq: Two times a day (BID) | INTRAVENOUS | Status: DC
  Administered 2018-07-21 – 2018-07-25 (×10): 3 mL via INTRAVENOUS

## 2018-07-21 MED ORDER — PANTOPRAZOLE SODIUM 40 MG IV SOLR
40.0000 mg | Freq: Two times a day (BID) | INTRAVENOUS | Status: DC
  Administered 2018-07-22 – 2018-07-25 (×8): 40 mg via INTRAVENOUS
  Filled 2018-07-21 (×8): qty 40

## 2018-07-21 MED ORDER — SODIUM CHLORIDE 0.9 % IV SOLN
80.0000 mg | Freq: Once | INTRAVENOUS | Status: AC
  Administered 2018-07-21: 80 mg via INTRAVENOUS
  Filled 2018-07-21: qty 80

## 2018-07-21 MED ORDER — POLYETHYLENE GLYCOL 3350 17 G PO PACK
17.0000 g | PACK | Freq: Two times a day (BID) | ORAL | Status: DC
  Administered 2018-07-22 – 2018-07-25 (×6): 17 g via ORAL
  Filled 2018-07-21 (×8): qty 1

## 2018-07-21 MED ORDER — LEVETIRACETAM 750 MG PO TABS
1500.0000 mg | ORAL_TABLET | Freq: Two times a day (BID) | ORAL | Status: DC
  Administered 2018-07-21 – 2018-07-25 (×9): 1500 mg via ORAL
  Filled 2018-07-21 (×9): qty 2

## 2018-07-21 MED ORDER — FERROUS SULFATE 325 (65 FE) MG PO TABS
325.0000 mg | ORAL_TABLET | Freq: Three times a day (TID) | ORAL | Status: DC
  Administered 2018-07-21 – 2018-07-25 (×10): 325 mg via ORAL
  Filled 2018-07-21 (×10): qty 1

## 2018-07-21 NOTE — ED Provider Notes (Signed)
[place] EMERGENCY DEPARTMENT Provider Note   CSN: [id number] Arrival date & time: 07/21/18  5397    History   Chief Complaint Chief Complaint  Patient presents with  . Abnormal Lab  . Melena    HPI [name] is a 78 y.o. male.     HPI Patient is a 78 year old male who presents the emergency department with 3 weeks of worsening generalized abdominal pain as well as associated nausea.  Denies vomiting.  Denies diarrhea.  Caregiver reports possible darker stools over the last several days.  He is on Coumadin and reportedly recently had an INR of 5.9.  He denies urinary complaints.  No fevers or chills.  No chest pain or shortness of breath.  He states he is never had pain like this before.    Past Medical History:  Diagnosis Date  . Hiatal hernia   . HTN (hypertension)    pt denies h/o HTN though it appears he was started on spironolactone during his hospitalization 3/13  . Pancreatitis   . Seizures ([place])   . Stroke ([place])   . Typical atrial flutter ([place]) 3/13    Patient Active Problem List   Diagnosis Date Noted  . Pressure injury of skin 12/13/2016  . Protein-calorie malnutrition, severe 12/09/2016  . Fall   . Closed fracture of femur, intertrochanteric, left, initial encounter ([place]) 12/06/2016  . Aspiration pneumonia ([place]) 12/01/2015  . Erosive gastropathy: Per EGD 11/30/2015 11/30/2015  . Iron deficiency anemia due to chronic blood loss 11/28/2015  . Localization-related symptomatic epilepsy and epileptic syndromes with complex partial seizures, not intractable, without status epilepticus ([place]) 07/07/2015  . Encounter for therapeutic drug monitoring 08/10/2013  . Dementia (AD) 11/16/2012  . Long term (current) use of anticoagulants 06/30/2011  . Atrial flutter ([place]) 06/04/2011  . 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 SINUSITIS, CHRONIC NEC 12/14/2006  . ABUSE, ALCOHOL, CONTINUOUS 10/11/2006  . CEREBROVASCULAR ACCIDENT, HX OF  10/11/2006  . Essential hypertension 09/15/2006  . CVA 09/15/2006  . ALLERGIC RHINITIS 09/15/2006  . CIRRHOSIS, ALCOHOLIC, LIVER 67/[birth year]/1937  . Seizure disorder ([place]) 09/15/2006  . PANCREATITIS, HX OF 09/15/2006    Past Surgical History:  Procedure Laterality Date  . ESOPHAGOGASTRODUODENOSCOPY N/A 11/30/2015   Procedure: ESOPHAGOGASTRODUODENOSCOPY (EGD);  Surgeon: Wonda Horner, MD;  Location: [facility] ENDOSCOPY;  Service: Endoscopy;  Laterality: N/A;  . FEMUR IM NAIL Left 12/08/2016  . FEMUR IM NAIL Left 12/07/2016   Procedure: INTRAMEDULLARY (IM) NAIL FEMORAL;  Surgeon: Renette Butters, MD;  Location: [place] [facility];  Service: Orthopedics;  Laterality: Left;  . HIP SURGERY Left 01/2017  . no surgical history  06/2011        Home Medications    Prior to Admission medications   Medication Sig Start Date End Date Taking? Authorizing Provider  levETIRAcetam (KEPPRA) 500 MG tablet TAKE 3 TABLETS BY MOUTH TWICE A DAY Patient taking differently: Take 1,500 mg by mouth 2 (two) times daily. TAKE 3 TABLETS BY MOUTH TWICE A DAY 06/15/18  Yes Cameron Sprang, MD  phenytoin (DILANTIN) 100 MG ER capsule TAKE 3 CAPSULES BY MOUTH DAILY Patient taking differently: Take 300 mg by mouth daily.  06/12/18  Yes Cameron Sprang, MD  predniSONE (DELTASONE) 10 MG tablet Take 10-15 mg by mouth See admin instructions. Taking 15 mg (1& 1/2 tablet) on Mon, Wed, Friday, all other days 1 tablet (10mg ) daily. 03/23/17  Yes [provider]  warfarin (COUMADIN) 2.5 MG tablet TAKE AS DIRECTED BY COUMADIN  CLINIC Patient taking differently: Take 2.5 mg by mouth daily at 6 PM. Take as directed by coumadin clinic 07/10/18  Yes Allred, Jeneen Rinks, MD  ferrous sulfate 325 (65 FE) MG tablet Take 1 tablet (325 mg total) by mouth 3 (three) times daily with meals. Patient not taking: Reported on 07/21/2018 12/15/16   Velvet Bathe, MD    Family History Family History  Problem Relation Age of Onset  . Hypertension Other     Social  History Social History   Tobacco Use  . Smoking status: Former Smoker    Last attempt to quit: 07/15/1966    Years since quitting: 52.0  . Smokeless tobacco: Never Used  Substance Use Topics  . Alcohol use: No    Comment: Quit greater than 5 years ago  . Drug use: No     Allergies   Patient has no known allergies.   Review of Systems Review of Systems  All other systems reviewed and are negative.    Physical Exam Updated Vital Signs BP 118/66   Pulse 89   Temp 98.6 F (37 C)   Resp 18   Ht 6' (1.829 m)   Wt 59 kg   SpO2 100%   BMI 17.63 kg/m   Physical Exam Vitals signs and nursing note reviewed.  Constitutional:      Appearance: He is well-developed.  HENT:     Head: Normocephalic and atraumatic.  Neck:     Musculoskeletal: Normal range of motion.  Cardiovascular:     Rate and Rhythm: Normal rate and regular rhythm.     Heart sounds: Normal heart sounds.  Pulmonary:     Effort: Pulmonary effort is normal. No respiratory distress.     Breath sounds: Normal breath sounds.  Abdominal:     General: There is no distension.     Palpations: Abdomen is soft.     Tenderness: There is no abdominal tenderness.  Skin:    General: Skin is warm and dry.  Neurological:     Mental Status: He is alert and oriented to person, place, and time.  Psychiatric:        Judgment: Judgment normal.      ED Treatments / Results  Labs (all labs ordered are listed, but only abnormal results are displayed) Labs Reviewed  COMPREHENSIVE METABOLIC PANEL - Abnormal; Notable for the following components:      Result Value   Glucose, Bld 103 (*)    Calcium 8.4 (*)    Total Protein 5.9 (*)    Total Bilirubin 0.2 (*)    All other components within normal limits  PROTIME-INR - Abnormal; Notable for the following components:   Prothrombin Time 38.3 (*)    INR 4.0 (*)    All other components within normal limits  CBC  LIPASE, BLOOD  POC OCCULT BLOOD, ED  TYPE AND SCREEN     EKG EKG Interpretation  Date/Time:  Friday Jul 21 2018 09:43:38 EDT Ventricular Rate:  92 PR Interval:  194 QRS Duration: 126 QT Interval:  392 QTC Calculation: 484 R Axis:   101 Text Interpretation:  Normal sinus rhythm Right bundle branch block Abnormal ECG No significant change was found Confirmed by [name] [phone]) on 07/21/2018 10:53:55 AM   Radiology No results found.  Procedures Procedures (including critical care time)  Medications Ordered in ED Medications  sodium chloride 0.9 % bolus 1,000 mL (1,000 mLs Intravenous New Bag/Given 07/21/18 1108)  ondansetron (ZOFRAN) injection 4 mg (4 mg Intravenous  Given 07/21/18 1108)     Initial Impression / Assessment and Plan / ED Course  I have reviewed the triage vital signs and the nursing notes.  Pertinent labs & imaging results that were available during my care of the patient were reviewed by me and considered in my medical decision making (see chart for details).       Vital signs are stable.  Patient will await labs and CT scan of his abdomen and pelvis.  INR is 4 at this time.  Hemoccult test pending.  Care transferred to [facility] for follow-up of labs and imaging and disposition planning   Final Clinical Impressions(s) / ED Diagnoses   Final diagnoses:  None    ED Discharge Orders    None       [name], MD 07/21/18 1155

## 2018-07-21 NOTE — ED Provider Notes (Signed)
Care assumed from Dr. Venora Maples at sign over of care, please see their notes for full documentation of patient's complaint/HPI. Briefly, pt here with abd pain for several weeks and reported darker stools, with INR elevated. Results so far show CMP essentially unremarkable, INR 4.0, lipase WNL. Awaiting CBC, FOBT, and CT abd/pelv. Plan is to reassess after that.   Additional history: pt states he just feels "sick", can't really describe it fully. When asked if he's fatigued he says he just doesn't feel well, and points to his abdomen. Denies having a cough. Reports having some abdominal pain and diarrhea, states his stool is of normal color and denies seeing any blood that he's aware of. Reports his PCP is Dr. Felipa Eth.   Physical Exam  BP 118/66   Pulse 89   Temp 98.6 F (37 C)   Resp 18   Ht 6' (1.829 m)   Wt 59 kg   SpO2 100%   BMI 17.63 kg/m   Physical Exam Gen: afebrile, VSS, NAD, looks slightly pale HEENT: EOMI, somewhat dry lips Resp: no resp distress CV: rate WNL Abd: Soft, NTND, +BS throughout, no r/g/r, neg murphy's, neg mcburney's GU: Chaperone present No gross red blood noted on rectal exam however grossly melanotic stool in rectal vault, normal tone, no tenderness, no mass or fissure, several old external hemorrhoidal skin tags but no prolapsed or thrombosed hemorhoids. FOBT+. No fecal impaction MsK: moving all extremities with ease Neuro: A&O  ED Course/Procedures    Results for orders placed or performed during the hospital encounter of 07/21/18  Comprehensive metabolic panel  Result Value Ref Range   Sodium 139 135 - 145 mmol/L   Potassium 3.9 3.5 - 5.1 mmol/L   Chloride 107 98 - 111 mmol/L   CO2 25 22 - 32 mmol/L   Glucose, Bld 103 (H) 70 - 99 mg/dL   BUN 14 8 - 23 mg/dL   Creatinine, Ser 0.76 0.61 - 1.24 mg/dL   Calcium 8.4 (L) 8.9 - 10.3 mg/dL   Total Protein 5.9 (L) 6.5 - 8.1 g/dL   Albumin 3.6 3.5 - 5.0 g/dL   AST 18 15 - 41 U/L   ALT 12 0 - 44 U/L    Alkaline Phosphatase 77 38 - 126 U/L   Total Bilirubin 0.2 (L) 0.3 - 1.2 mg/dL   GFR calc non Af Amer >60 >60 mL/min   GFR calc Af Amer >60 >60 mL/min   Anion gap 7 5 - 15  CBC  Result Value Ref Range   WBC 4.9 4.0 - 10.5 K/uL   RBC 2.04 (L) 4.22 - 5.81 MIL/uL   Hemoglobin 4.7 (LL) 13.0 - 17.0 g/dL   HCT 16.2 (L) 39.0 - 52.0 %   MCV 79.4 (L) 80.0 - 100.0 fL   MCH 23.0 (L) 26.0 - 34.0 pg   MCHC 29.0 (L) 30.0 - 36.0 g/dL   RDW 24.5 (H) 11.5 - 15.5 %   Platelets 197 150 - 400 K/uL   nRBC 0.4 (H) 0.0 - 0.2 %  Protime-INR - (order if Patient is taking Coumadin / Warfarin)  Result Value Ref Range   Prothrombin Time 38.3 (H) 11.4 - 15.2 seconds   INR 4.0 (H) 0.8 - 1.2  Lipase, blood  Result Value Ref Range   Lipase 26 11 - 51 U/L  POC occult blood, ED  Result Value Ref Range   Fecal Occult Bld POSITIVE (A) NEGATIVE  Type and screen Kyle  Result Value  Ref Range   ABO/RH(D) O NEG    Antibody Screen NEG    Sample Expiration      07/24/2018 Performed at [facility], [place] [address] [place], [place] [address]    Unit Number [id number]    Blood Component Type RED CELLS,LR    Unit division 00    Status of Unit ALLOCATED    Transfusion Status OK TO TRANSFUSE    Crossmatch Result COMPATIBLE    Unit Number [id number]    Blood Component Type RED CELLS,LR    Unit division 00    Status of Unit ALLOCATED    Transfusion Status OK TO TRANSFUSE    Crossmatch Result COMPATIBLE   Prepare fresh frozen plasma  Result Value Ref Range   Unit Number [id number]    Blood Component Type THAWED PLASMA    Unit division 00    Status of Unit ALLOCATED    Transfusion Status OK TO TRANSFUSE    Unit Number [id number]    Blood Component Type THAWED PLASMA    Unit division 00    Status of Unit ALLOCATED    Transfusion Status OK TO TRANSFUSE   Prepare RBC  Result Value Ref Range   Order Confirmation      ORDER PROCESSED BY BLOOD BANK Performed at [facility], [place] [address], [place], [place] [address]   BPAM [facility]  Result Value Ref Range   Blood Product Unit Number [id number]    Unit Type and Rh 5100    Blood Product Expiration Date 765465035465    Blood Product Unit Number [id number]    Unit Type and Rh 5100    Blood Product Expiration Date 944967591638   BPAM RBC  Result Value Ref Range   ISSUE DATE / TIME 466599357017    Blood Product Unit Number [id number]    PRODUCT CODE [id number]    Unit Type and Rh 5100    Blood Product Expiration Date 354562563893    Blood Product Unit Number [id number]    Unit Type and Rh 5100    Blood Product Expiration Date 620355974163    Ct Abdomen Pelvis W Contrast  Result Date: 07/21/2018 CLINICAL DATA:  Abdominal pain and nausea. EXAM: CT ABDOMEN AND PELVIS WITH CONTRAST TECHNIQUE: Multidetector CT imaging of the abdomen and pelvis was performed using the standard protocol following bolus administration of intravenous contrast. CONTRAST:  145mL ISOVUE-300 IOPAMIDOL (ISOVUE-300) INJECTION 61% COMPARISON:  CT scan dated 12/10/2016 FINDINGS: Lower chest: Minimal atelectasis at the lung bases posteriorly. Heart size is normal. Minimal pericardial fluid. Hepatobiliary: Extensive small calcified granulomas throughout the liver and spleen. 12 mm enhancing lesion in the right lobe of the liver consistent with a benign hemangioma. Liver parenchyma is otherwise normal. Biliary tree is normal. Pancreas: Unremarkable. No pancreatic ductal dilatation or surrounding inflammatory changes. Spleen: Numerous calcified granulomas in the otherwise normal spleen. Adrenals/Urinary Tract: Normal adrenal glands. Normal appearing kidneys. No hydronephrosis. Bladder is normal. Stomach/Bowel: Stomach is within normal limits. Appendix appears normal. There is extensive stool throughout the colon including a moderate amount of stool in the rectum. The sigmoid portion of the colon is elongated and there is gaseous  distention of the sigmoid colon. This is best appreciated on the coronal images. No evidence of bowel wall thickening, distention, or inflammatory changes. There are a few calcifications in the soft tissues adjacent to the hilum of the liver and adjacent to the head of the pancreas consistent calcified lymph nodes. Vascular/Lymphatic: Slight aortic atherosclerosis.  No adenopathy. Reproductive: Prostate is unremarkable. Other: No abdominal wall hernia or abnormality. No abdominopelvic ascites. Musculoskeletal: No acute abnormality. Diffuse degenerative changes in the lumbar spine. Moderate arthritis of both hips. Old healed proximal left femur fracture. IMPRESSION: 1. Extensive stool in the colon. Slight gaseous distention of the sigmoid colon which is elongated and extends almost to the level of the diaphragm. 2. No acute abnormalities. 3.  Aortic Atherosclerosis (ICD10-I70.0). 4. Chronic calcified granulomas in the liver and spleen and upper abdominal lymph nodes consistent with previous granulomatous disease. Electronically Signed   By: Lorriane Shire [name]   On: 07/21/2018 12:49     Meds ordered this encounter  Medications  . sodium chloride 0.9 % bolus 1,000 mL  . ondansetron (ZOFRAN) injection 4 mg  . iopamidol (ISOVUE-300) 61 % injection 100 mL  . phytonadione (VITAMIN K) 10 mg in dextrose 5 % 50 mL IVPB  . 0.9 %  sodium chloride infusion  . 0.9 %  sodium chloride infusion     MDM:   ICD-10-CM   1. Gastrointestinal hemorrhage with melena K92.1   2. Supratherapeutic INR R79.1     CRITICAL CARE- transfusion Performed by: Reece Agar    Total critical care time: 35 minutes  Critical care time was exclusive of separately billable procedures and treating other patients.  Critical care was necessary to treat or prevent imminent or life-threatening deterioration.  Critical care was time spent personally by me on the following activities: development of treatment plan with patient  and/or surrogate as well as nursing, discussions with consultants, evaluation of patient's response to treatment, examination of patient, obtaining history from patient or surrogate, ordering and performing treatments and interventions, ordering and review of laboratory studies, ordering and review of radiographic studies, pulse oximetry and re-evaluation of patient's condition.   1:19 PM CBC showing Hgb 4.7. CT abd/pelv showing extensive stool in colon with slight gaseous distension of sigmoid colon. FOBT+. Melena on rectal exam. Will proceed with reversal of coumadin with FFP and Vitamin K, give blood, protonix, and admit. Discussed case with my attending Dr. Venora Maples who agrees with plan.   1:42 PM Dr. Tamala Julian of [facility] returning page and will admit. Holding orders to be placed by admitting team. Please see their notes for further documentation of care. I appreciate their help with this pleasant pt's care. Pt stable at time of admission.     [address] [place], [place] 07/21/18 [place]    [name], MD 07/21/18 2201

## 2018-07-21 NOTE — ED Notes (Signed)
Attempted to call report to [facility]W. Was told that they were moving the patient's room and were going to assign a different nurse and they would call me back.

## 2018-07-21 NOTE — ED Triage Notes (Signed)
Pt in from home with c/o nausea x 1 mo. Has increased wkns per caregiver and possible dark stools x few days. Takes Coumadin, reports an elevated INR level of 5.92 per caregiver. Denies any sob or dizziness.

## 2018-07-21 NOTE — ED Notes (Signed)
IV in LFA infiltrated shortly after administration of Zofran; Rachel, pharmacist notified, confirmed Zofran not a vesicant; IV removed; site clean, dry, intact; no redness, pt not c/o pain at site

## 2018-07-21 NOTE — ED Notes (Signed)
Attempted to call report a second time. Was told to give the nurse 5 minutes to give report to a nursing home and then I can call back.

## 2018-07-21 NOTE — Consult Note (Signed)
[facility] Gastroenterology Consultation Note  Referring Provider: Dr. Fuller [facility]) Primary Care Physician:  [name], MD Primary Gastroenterologist:  Dr. Michail Sermon  Reason for Consultation:  anemia  HPI: [name] is a 78 y.o. male admitted for anemia.  Patient on anticoagulation for atrial fibrillation with history of stroke.  Went to hospital for abdominal pain, CT showed constipation and atherosclerosis but no acute process; labs showed INR 5 and Hgb 4.7.  He states his pain is better after defecation.  States he has no blood in stool or hematemesis.  Has had some nausea and 10-lb weight loss over the past week.  Denies any prior endoscopy or colonoscopy, but review of our [facility] outpatient chart and Epic shows he actually has had two prior endoscopies and one colonoscopy within the past 4 years, all either for GI bleeding +/- anemia.  Stool hemoccult-positive.     Past Medical History:  Diagnosis Date  . Hiatal hernia   . HTN (hypertension)    pt denies h/o HTN though it appears he was started on spironolactone during his hospitalization 3/13  . Pancreatitis   . Seizures ([place])   . Stroke ([place])   . Typical atrial flutter ([place]) 3/13    Past Surgical History:  Procedure Laterality Date  . ESOPHAGOGASTRODUODENOSCOPY N/A 11/30/2015   Procedure: ESOPHAGOGASTRODUODENOSCOPY (EGD);  Surgeon: Wonda Horner, MD;  [facility] ENDOSCOPY;  Service: Endoscopy;  Laterality: N/A;  . FEMUR IM NAIL Left 12/08/2016  . FEMUR IM NAIL Left 12/07/2016   Procedure: INTRAMEDULLARY (IM) NAIL FEMORAL;  Surgeon: Renette Butters, MD;  Location: [place];  Service: Orthopedics;  Laterality: Left;  . HIP SURGERY Left 01/2017  . no surgical history  06/2011    Prior to Admission medications   Medication Sig Start Date End Date Taking? Authorizing Provider  levETIRAcetam (KEPPRA) 500 MG tablet TAKE 3 TABLETS BY MOUTH TWICE A DAY Patient taking differently: Take 1,500 mg by mouth 2 (two) times  daily. TAKE 3 TABLETS BY MOUTH TWICE A DAY 06/15/18  Yes Cameron Sprang, MD  phenytoin (DILANTIN) 100 MG ER capsule TAKE 3 CAPSULES BY MOUTH DAILY Patient taking differently: Take 300 mg by mouth daily.  06/12/18  Yes Cameron Sprang, MD  predniSONE (DELTASONE) 10 MG tablet Take 10-15 mg by mouth See admin instructions. Taking 15 mg (1& 1/2 tablet) on Mon, Wed, Friday, all other days 1 tablet (10mg ) daily. 03/23/17  Yes [provider]  warfarin (COUMADIN) 2.5 MG tablet TAKE AS DIRECTED BY COUMADIN CLINIC Patient taking differently: Take 2.5 mg by mouth daily at 6 PM. Take as directed by coumadin clinic 07/10/18  Yes Allred, Jeneen Rinks, MD  ferrous sulfate 325 (65 FE) MG tablet Take 1 tablet (325 mg total) by mouth 3 (three) times daily with meals. Patient not taking: Reported on 07/21/2018 12/15/16   Velvet Bathe, MD    Current Facility-Administered Medications  Medication Dose Route Frequency Provider Last Rate Last Dose  . 0.9 %  sodium chloride infusion  10 mL/hr Intravenous Once Tamala Julian, Rondell A, MD      . ferrous sulfate tablet 325 mg  325 mg Oral TID WC Smith, Rondell A, MD      . levETIRAcetam (KEPPRA) tablet 1,500 mg  1,500 mg Oral BID Smith, Rondell A, MD      . pantoprazole (PROTONIX) injection 40 mg  40 mg Intravenous Q12H Smith, Rondell A, MD      . sodium chloride flush (NS) 0.9 % injection 3  mL  3 mL Intravenous Q12H Norval Morton, MD        Allergies as of 07/21/2018  . (No Known Allergies)    Family History  Problem Relation Age of Onset  . Hypertension Other     Social History   Socioeconomic History  . Marital status: Divorced    Spouse name: Not on file  . Number of children: Not on file  . Years of education: Not on file  . Highest education level: Not on file  Occupational History  . Not on file  Social Needs  . Financial resource strain: Not on file  . Food insecurity:    Worry: Not on file    Inability: Not on file  . Transportation needs:     Medical: Not on file    Non-medical: Not on file  Tobacco Use  . Smoking status: Former Smoker    Last attempt to quit: 07/15/1966    Years since quitting: 52.0  . Smokeless tobacco: Never Used  Substance and Sexual Activity  . Alcohol use: No    Comment: Quit greater than 5 years ago  . Drug use: No  . Sexual activity: Not on file  Lifestyle  . Physical activity:    Days per week: Not on file    Minutes per session: Not on file  . Stress: Not on file  Relationships  . Social connections:    Talks on phone: Not on file    Gets together: Not on file    Attends religious service: Not on file    Active member of club or organization: Not on file    Attends meetings of clubs or organizations: Not on file    Relationship status: Not on file  . Intimate partner violence:    Fear of current or ex partner: Not on file    Emotionally abused: Not on file    Physically abused: Not on file    Forced sexual activity: Not on file  Other Topics Concern  . Not on file  Social History Narrative   Lives alone.  Retired from [employer]    Review of Systems: As per HPI, all others negative  Physical Exam: Vital signs in last 24 hours: Temp:  [98.4 F (36.9 C)-98.6 F (37 C)] 98.4 F (36.9 C) (05/01 1511) Pulse Rate:  [80-91] 80 (05/01 1511) Resp:  [12-18] 18 (05/01 1511) BP: (105-119)/(52-67) 119/67 (05/01 1511) SpO2:  [93 %-100 %] 96 % (05/01 1511) Weight:  [57.2 kg-61.4 kg] 61.4 kg (05/01 1511)   General:   Alert,  Well-developed, thin, pleasant and cooperative in NAD Head:  Normocephalic and atraumatic. Eyes:  Sclera clear, no icterus.   Conjunctiva pink. Ears:  Normal auditory acuity. Nose:  No deformity, discharge,  or lesions. Mouth:  No deformity or lesions.  Oropharynx pink & moist. Neck:  Supple; no masses or thyromegaly. Lungs:  Clear throughout to auscultation.   No wheezes, crackles, or rhonchi. No acute distress. Heart:  Regular rate and rhythm; no murmurs,  clicks, rubs,  or gallops. Abdomen:  Soft, nontender and nondistended. No masses, hepatosplenomegaly or hernias noted. Normal bowel sounds, without guarding, and without rebound.     Msk:  Symmetrical without gross deformities. Normal posture. Pulses:  Normal pulses noted. Extremities:  Without clubbing or edema. Neurologic:  Alert and  oriented x4;  grossly normal neurologically. Skin:  Intact without significant lesions or rashes. Psych:  Alert and cooperative. Normal mood and affect.   Lab  Results: Recent Labs    07/21/18 0957  WBC 4.9  HGB 4.7*  HCT 16.2*  PLT 197   BMET Recent Labs    07/21/18 0957  NA 139  K 3.9  CL 107  CO2 25  GLUCOSE 103*  BUN 14  CREATININE 0.76  CALCIUM 8.4*   LFT Recent Labs    07/21/18 0957  PROT 5.9*  ALBUMIN 3.6  AST 18  ALT 12  ALKPHOS 77  BILITOT 0.2*   PT/INR Recent Labs    07/20/18 1421 07/21/18 0957  LABPROT  --  38.3*  INR 5.2* 4.0*    Studies/Results: Ct Abdomen Pelvis W Contrast  Result Date: 07/21/2018 CLINICAL DATA:  Abdominal pain and nausea. EXAM: CT ABDOMEN AND PELVIS WITH CONTRAST TECHNIQUE: Multidetector CT imaging of the abdomen and pelvis was performed using the standard protocol following bolus administration of intravenous contrast. CONTRAST:  169mL ISOVUE-300 IOPAMIDOL (ISOVUE-300) INJECTION 61% COMPARISON:  CT scan dated 12/10/2016 FINDINGS: Lower chest: Minimal atelectasis at the lung bases posteriorly. Heart size is normal. Minimal pericardial fluid. Hepatobiliary: Extensive small calcified granulomas throughout the liver and spleen. 12 mm enhancing lesion in the right lobe of the liver consistent with a benign hemangioma. Liver parenchyma is otherwise normal. Biliary tree is normal. Pancreas: Unremarkable. No pancreatic ductal dilatation or surrounding inflammatory changes. Spleen: Numerous calcified granulomas in the otherwise normal spleen. Adrenals/Urinary Tract: Normal adrenal glands. Normal appearing  kidneys. No hydronephrosis. Bladder is normal. Stomach/Bowel: Stomach is within normal limits. Appendix appears normal. There is extensive stool throughout the colon including a moderate amount of stool in the rectum. The sigmoid portion of the colon is elongated and there is gaseous distention of the sigmoid colon. This is best appreciated on the coronal images. No evidence of bowel wall thickening, distention, or inflammatory changes. There are a few calcifications in the soft tissues adjacent to the hilum of the liver and adjacent to the head of the pancreas consistent calcified lymph nodes. Vascular/Lymphatic: Slight aortic atherosclerosis.  No adenopathy. Reproductive: Prostate is unremarkable. Other: No abdominal wall hernia or abnormality. No abdominopelvic ascites. Musculoskeletal: No acute abnormality. Diffuse degenerative changes in the lumbar spine. Moderate arthritis of both hips. Old healed proximal left femur fracture. IMPRESSION: 1. Extensive stool in the colon. Slight gaseous distention of the sigmoid colon which is elongated and extends almost to the level of the diaphragm. 2. No acute abnormalities. 3.  Aortic Atherosclerosis (ICD10-I70.0). 4. Chronic calcified granulomas in the liver and spleen and upper abdominal lymph nodes consistent with previous granulomatous disease. Electronically Signed   By: Lorriane Shire [name]   On: 07/21/2018 12:49    Impression:  1.  Anemia.  Problem in past with endoscopy/colonoscopy 2016 [facility]) and endoscopy 2017 (hospital) for iron-deficiency anemia and melena, respectively, in past, which were all unrevealing. 2.  Hemoccult positive. 3.  Constipation. 4.  Lower abdominal pain.  Suspect constipation-related; CT without obvious mass/tumor. 5.  Stroke on chronic anticoagulation.  Plan:  1.  Clear liquid diet ok. 2.  Reverse coagulopathy. 3.  Volume repletion, serial CBCs and transfusion as needed. 4.  PPI. Miralax. 5.  Since patient  will need longstanding anticoagulation, would consider endoscopy this admission once coagulopathy reversed and significant anemia improved; if endoscopy again negative, might consider capsule endoscopy.  Would only consider repeat colonoscopy if endoscopy and capsule this admission are unrevealing. 6.  [facility] will revisit tomorrow.   LOS: 0 days   Briar Sword M  07/21/2018, 4:13 PM  Cell [phone] If no answer or after 5 PM call [phone]

## 2018-07-21 NOTE — ED Notes (Signed)
ED TO INPATIENT HANDOFF REPORT  ED Nurse Name and Phone #: [phone]  S Name/Age/Gender [name] 78 y.o. male Room/Bed: 037C/037C  Code Status   Code Status: Full Code  Home/SNF/Other Home Patient oriented to: self, place, time and situation Is this baseline? Yes   Triage Complete: Triage complete  Chief Complaint coumadin levels high  Triage Note Pt in from home with c/o nausea x 1 mo. Has increased wkns per caregiver and possible dark stools x few days. Takes Coumadin, reports an elevated INR level of 5.92 per caregiver. Denies any sob or dizziness.   Allergies No Known Allergies  Level of Care/Admitting Diagnosis ED Disposition    ED Disposition Condition Comment   Admit  Hospital Area: [place] [[id number]]  Level of Care: Progressive [102]  I expect the patient will be discharged within 24 hours: No (not a candidate for 5C-Observation unit)  Covid Evaluation: N/A  Diagnosis: Gastrointestinal bleeding [696295]  Admitting Physician: Norval Morton [[id number]]  Attending Physician: Norval Morton [[id number]]  PT Class (Do Not Modify): Observation [104]  PT Acc Code (Do Not Modify): Observation [10022]       B Medical/Surgery History Past Medical History:  Diagnosis Date  . Hiatal hernia   . HTN (hypertension)    pt denies h/o HTN though it appears he was started on spironolactone during his hospitalization 3/13  . Pancreatitis   . Seizures ([place])   . Stroke ([place])   . Typical atrial flutter ([place]) 3/13   Past Surgical History:  Procedure Laterality Date  . ESOPHAGOGASTRODUODENOSCOPY N/A 11/30/2015   Procedure: ESOPHAGOGASTRODUODENOSCOPY (EGD);  Surgeon: Wonda Horner, MD;  Location: [facility] ENDOSCOPY;  Service: Endoscopy;  Laterality: N/A;  . FEMUR IM NAIL Left 12/08/2016  . FEMUR IM NAIL Left 12/07/2016   Procedure: INTRAMEDULLARY (IM) NAIL FEMORAL;  Surgeon: Renette Butters, MD;  Location: [place];  Service: Orthopedics;  Laterality:  Left;  . HIP SURGERY Left 01/2017  . no surgical history  06/2011     A IV Location/Drains/Wounds Patient Lines/Drains/Airways Status   Active Line/Drains/Airways    Name:   Placement date:   Placement time:   Site:   Days:   Peripheral IV 07/21/18 Right Antecubital   07/21/18    1122    Antecubital   less than 1   External Urinary Catheter   12/09/16    0900    -   589   Incision (Closed) 12/07/16 Hip Left   12/07/16    1343     591   Pressure Injury 12/13/16 Stage II -  Partial thickness loss of dermis presenting as a shallow open ulcer with a red, pink wound bed without slough. Pink and open, placed pink foam; surrounded by dark colored deep tissue injury which has evlved into stage   12/13/16    0820     585   Wound 06/02/11 Skin tear Shoulder Right pink   06/02/11    0800    Shoulder   2606   Wound 06/02/11 Shoulder Left pink   06/02/11    0800    Shoulder   2606   Wound 06/02/11 Other (Comment) Hand Right;Posterior;Lateral   06/02/11    -    Hand   2606          Intake/Output Last 24 hours  Intake/Output Summary (Last 24 hours) at 07/21/2018 1409 Last data filed at 07/21/2018 1335 Gross per 24 hour  Intake 1000 ml  Output -  Net 1000 ml    Labs/Imaging Results for orders placed or performed during the hospital encounter of 07/21/18 (from the past 48 hour(s))  Comprehensive metabolic panel     Status: Abnormal   Collection Time: 07/21/18  9:57 AM  Result Value Ref Range   Sodium 139 135 - 145 mmol/L   Potassium 3.9 3.5 - 5.1 mmol/L   Chloride 107 98 - 111 mmol/L   CO2 25 22 - 32 mmol/L   Glucose, Bld 103 (H) 70 - 99 mg/dL   BUN 14 8 - 23 mg/dL   Creatinine, Ser 0.76 0.61 - 1.24 mg/dL   Calcium 8.4 (L) 8.9 - 10.3 mg/dL   Total Protein 5.9 (L) 6.5 - 8.1 g/dL   Albumin 3.6 3.5 - 5.0 g/dL   AST 18 15 - 41 U/L   ALT 12 0 - 44 U/L   Alkaline Phosphatase 77 38 - 126 U/L   Total Bilirubin 0.2 (L) 0.3 - 1.2 mg/dL   GFR calc non Af Amer >60 >60 mL/min   GFR calc Af Amer >60  >60 mL/min   Anion gap 7 5 - 15    Comment: Performed at [facility], [address], [place] [address]  CBC     Status: Abnormal   Collection Time: 07/21/18  9:57 AM  Result Value Ref Range   WBC 4.9 4.0 - 10.5 K/uL   RBC 2.04 (L) 4.22 - 5.81 MIL/uL   Hemoglobin 4.7 (LL) 13.0 - 17.0 g/dL    Comment: REPEATED TO VERIFY THIS CRITICAL RESULT HAS VERIFIED AND BEEN CALLED TO R KENNEDY,RN BY KIM COLVIN ON 05 01 2020 AT 1302, AND HAS BEEN READ BACK.     HCT 16.2 (L) 39.0 - 52.0 %   MCV 79.4 (L) 80.0 - 100.0 fL   MCH 23.0 (L) 26.0 - 34.0 pg   MCHC 29.0 (L) 30.0 - 36.0 g/dL   RDW 24.5 (H) 11.5 - 15.5 %   Platelets 197 150 - 400 K/uL   nRBC 0.4 (H) 0.0 - 0.2 %    Comment: Performed at [facility] [address] [place], [place] [address]  Type and screen [place]     Status: None (Preliminary result)   Collection Time: 07/21/18  9:57 AM  Result Value Ref Range   ABO/RH(D) O NEG    Antibody Screen NEG    Sample Expiration      07/24/2018 Performed at [facility], [place] [address] [place], [place] [address]    Unit Number [id number]    Blood Component Type RED CELLS,LR    Unit division 00    Status of Unit ALLOCATED    Transfusion Status OK TO TRANSFUSE    Crossmatch Result COMPATIBLE    Unit Number [id number]    Blood Component Type RED CELLS,LR    Unit division 00    Status of Unit ALLOCATED    Transfusion Status OK TO TRANSFUSE    Crossmatch Result COMPATIBLE   Protime-INR - (order if Patient is taking Coumadin / Warfarin)     Status: Abnormal   Collection Time: 07/21/18  9:57 AM  Result Value Ref Range   Prothrombin Time 38.3 (H) 11.4 - 15.2 seconds   INR 4.0 (H) 0.8 - 1.2    Comment: (NOTE) INR goal varies based on device and disease states. Performed at [facility], [place] [address], [place], [place] [address]  Lipase, blood     Status: None   Collection Time: 07/21/18  9:57 AM  Result Value Ref Range    Lipase 26 11 - 51 U/L    Comment: Performed at [facility] [address] [place], [place] [address]  Prepare fresh frozen plasma     Status: None (Preliminary result)   Collection Time: 07/21/18  1:12 PM  Result Value Ref Range   Unit Number [id number]    Blood Component Type THAWED PLASMA    Unit division 00    Status of Unit ALLOCATED    Transfusion Status OK TO TRANSFUSE    Unit Number [id number]    Blood Component Type THAWED PLASMA    Unit division 00    Status of Unit ALLOCATED    Transfusion Status OK TO TRANSFUSE   Prepare RBC     Status: None   Collection Time: 07/21/18  1:12 PM  Result Value Ref Range   Order Confirmation      ORDER PROCESSED BY BLOOD BANK Performed at [facility], [place] [address] [place], [place] [address]   POC occult blood, ED     Status: Abnormal   Collection Time: 07/21/18  1:38 PM  Result Value Ref Range   Fecal Occult Bld POSITIVE (A) NEGATIVE   Ct Abdomen Pelvis W Contrast  Result Date: 07/21/2018 CLINICAL DATA:  Abdominal pain and nausea. EXAM: CT ABDOMEN AND PELVIS WITH CONTRAST TECHNIQUE: Multidetector CT imaging of the abdomen and pelvis was performed using the standard protocol following bolus administration of intravenous contrast. CONTRAST:  168mL ISOVUE-300 IOPAMIDOL (ISOVUE-300) INJECTION 61% COMPARISON:  CT scan dated 12/10/2016 FINDINGS: Lower chest: Minimal atelectasis at the lung bases posteriorly. Heart size is normal. Minimal pericardial fluid. Hepatobiliary: Extensive small calcified granulomas throughout the liver and spleen. 12 mm enhancing lesion in the right lobe of the liver consistent with a benign hemangioma. Liver parenchyma is otherwise normal. Biliary tree is normal. Pancreas: Unremarkable. No pancreatic ductal dilatation or surrounding inflammatory changes. Spleen: Numerous calcified granulomas in the otherwise normal spleen. Adrenals/Urinary Tract: Normal adrenal glands. Normal appearing kidneys. No  hydronephrosis. Bladder is normal. Stomach/Bowel: Stomach is within normal limits. Appendix appears normal. There is extensive stool throughout the colon including a moderate amount of stool in the rectum. The sigmoid portion of the colon is elongated and there is gaseous distention of the sigmoid colon. This is best appreciated on the coronal images. No evidence of bowel wall thickening, distention, or inflammatory changes. There are a few calcifications in the soft tissues adjacent to the hilum of the liver and adjacent to the head of the pancreas consistent calcified lymph nodes. Vascular/Lymphatic: Slight aortic atherosclerosis.  No adenopathy. Reproductive: Prostate is unremarkable. Other: No abdominal wall hernia or abnormality. No abdominopelvic ascites. Musculoskeletal: No acute abnormality. Diffuse degenerative changes in the lumbar spine. Moderate arthritis of both hips. Old healed proximal left femur fracture. IMPRESSION: 1. Extensive stool in the colon. Slight gaseous distention of the sigmoid colon which is elongated and extends almost to the level of the diaphragm. 2. No acute abnormalities. 3.  Aortic Atherosclerosis (ICD10-I70.0). 4. Chronic calcified granulomas in the liver and spleen and upper abdominal lymph nodes consistent with previous granulomatous disease. Electronically Signed   By: Lorriane Shire [name]   On: 07/21/2018 12:49    Pending Labs Unresulted Labs (From admission, onward)    Start     Ordered   07/22/18 0500  CBC  Tomorrow morning,   R  07/21/18 1404   07/22/18 6812  Basic metabolic panel  Tomorrow morning,   R     07/21/18 1404   07/21/18 1600  Protime-INR  Once-Timed,   R     07/21/18 1404          Vitals/Pain Today's Vitals   07/21/18 1042 07/21/18 1050 07/21/18 1110 07/21/18 1357  BP: 118/66   105/60  Pulse: 89   81  Resp: 18   12  Temp:      SpO2: 100%   100%  Weight:   59 kg   Height:   6' (1.829 m)   PainSc:  9       Isolation Precautions No  active isolations  Medications Medications  phytonadione (VITAMIN K) 10 mg in dextrose 5 % 50 mL IVPB (10 mg Intravenous New Bag/Given 07/21/18 1331)  0.9 %  sodium chloride infusion (has no administration in time range)  pantoprazole (PROTONIX) 80 mg in sodium chloride 0.9 % 100 mL IVPB (has no administration in time range)  ferrous sulfate tablet 325 mg (has no administration in time range)  levETIRAcetam (KEPPRA) tablet 1,500 mg (has no administration in time range)  pantoprazole (PROTONIX) injection 40 mg (has no administration in time range)  sodium chloride flush (NS) 0.9 % injection 3 mL (has no administration in time range)  sodium chloride 0.9 % bolus 1,000 mL (0 mLs Intravenous Stopped 07/21/18 1335)  ondansetron (ZOFRAN) injection 4 mg (4 mg Intravenous Given 07/21/18 1108)  iopamidol (ISOVUE-300) 61 % injection 100 mL (100 mLs Intravenous Contrast Given 07/21/18 1223)  0.9 %  sodium chloride infusion (10 mL/hr Intravenous New Bag/Given 07/21/18 1333)    Mobility non-ambulatory High fall risk   Focused Assessments    R Recommendations: See Admitting Provider Note  Report given to:   Additional Notes:

## 2018-07-21 NOTE — H&P (Signed)
History and Physical    [name] KWI:[id number] DOB: 1940/08/06 DOA: 07/21/2018  Referring MD/NP/PA: Reece Agar, PA-C PCP: Brent Manes, MD  Patient coming from: home  Chief Complaint: Abdominal pain  I have personally briefly reviewed patient's old medical records in [place]   HPI: [name] is a 78 y.o. male with medical history significant of HTN, atrial flutter on chronic anticoagulation, CVA, history of GI bleed, and seizure disorder; who presents with complaints of 2 weeks of lower abdominal pain.  History is somewhat difficult to obtain as patient is hard of hearing.  Notes symptoms are usually worse in the morning and eases off as the day progresses.  Associated symptoms included nausea.  Patient reports his last bowel movement was yesterday.  The patient denies any blood in stools, but caregiver was noted as stating stools were darker than normal for the last few days.  Last INR noted to be 5.2 yesterday.  Denies having any shortness of breath, chest pain, vomiting, diarrhea, dysuria, lower extremity swelling, or chest pain.  Review of records shows that patient last had a EGD in 11/2015 completed by Dr. Penelope Dominguez noted erosive gastropathy.    ED Course: Upon admission to the emergency department patient was noted to have vital signs relatively within normal limits.  Labs revealed hemoglobin 4.7 and INR 4.  Patient was typed and screened for need of blood products.  Ordered to be given 2 units of FFP and 2 units of packed red blood cells.  TRH called to admit.   Review of Systems  Constitutional: Negative for fever.  HENT: Negative for congestion and sinus pain.   Eyes: Negative for photophobia and pain.  Respiratory: Negative for cough and shortness of breath.   Cardiovascular: Negative for chest pain, claudication and leg swelling.  Gastrointestinal: Positive for abdominal pain and nausea. Negative for blood in stool.  Genitourinary: Negative for  dysuria, frequency and hematuria.  Skin: Negative for rash.  Neurological: Negative for focal weakness and loss of consciousness.  Psychiatric/Behavioral: Negative for substance abuse.    Past Medical History:  Diagnosis Date  . Hiatal hernia   . HTN (hypertension)    pt denies h/o HTN though it appears he was started on spironolactone during his hospitalization 3/13  . Pancreatitis   . Seizures ([place])   . Stroke ([place])   . Typical atrial flutter ([place]) 3/13    Past Surgical History:  Procedure Laterality Date  . ESOPHAGOGASTRODUODENOSCOPY N/A 11/30/2015   Procedure: ESOPHAGOGASTRODUODENOSCOPY (EGD);  Surgeon: Brent Horner, MD;  Location[facility] ENDOSCOPY;  Service: Endoscopy;  Laterality: N/A;  . FEMUR IM NAIL Left 12/08/2016  . FEMUR IM NAIL Left 12/07/2016   Procedure: INTRAMEDULLARY (IM) NAIL FEMORAL;  Surgeon: Brent Butters, MD;  Location: [place];  Service: Orthopedics;  Laterality: Left;  . HIP SURGERY Left 01/2017  . no surgical history  06/2011     reports that he quit smoking about 52 years ago. He has never used smokeless tobacco. He reports that he does not drink alcohol or use drugs.  No Known Allergies  Family History  Problem Relation Age of Onset  . Hypertension Other     Prior to Admission medications   Medication Sig Start Date End Date Taking? Authorizing Provider  levETIRAcetam (KEPPRA) 500 MG tablet TAKE 3 TABLETS BY MOUTH TWICE A DAY Patient taking differently: Take 1,500 mg by mouth 2 (two) times daily. TAKE 3 TABLETS BY MOUTH TWICE A DAY 06/15/18  Yes Brent Sprang, MD  phenytoin (DILANTIN) 100 MG ER capsule TAKE 3 CAPSULES BY MOUTH DAILY Patient taking differently: Take 300 mg by mouth daily.  06/12/18  Yes Brent Sprang, MD  predniSONE (DELTASONE) 10 MG tablet Take 10-15 mg by mouth See admin instructions. Taking 15 mg (1& 1/2 tablet) on Mon, Wed, Friday, all other days 1 tablet (10mg ) daily. 03/23/17  Yes [provider]  warfarin (COUMADIN)  2.5 MG tablet TAKE AS DIRECTED BY COUMADIN CLINIC Patient taking differently: Take 2.5 mg by mouth daily at 6 PM. Take as directed by coumadin clinic 07/10/18  Yes [name], Brent Rinks, MD  ferrous sulfate 325 (65 FE) MG tablet Take 1 tablet (325 mg total) by mouth 3 (three) times daily with meals. Patient not taking: Reported on 07/21/2018 12/15/16   Velvet Bathe, MD    Physical Exam:  Constitutional: Elderly male NAD, calm, comfortable Vitals:   07/21/18 0939 07/21/18 0950 07/21/18 1042 07/21/18 1110  BP: (!) 113/52  118/66   Pulse: 91  89   Resp: 18  18   Temp: 98.6 F (37 C)     SpO2: 93%  100%   Weight:  57.2 kg  59 kg  Height:    6' (1.829 m)   Eyes: PERRL, lids and conjunctivae normal ENMT: Mucous membranes are moist. Posterior pharynx clear of any exudate or lesions. Hard of Hearing. Neck: normal, supple, no masses, no thyromegaly Respiratory: clear to auscultation bilaterally, no wheezing, no crackles. Normal respiratory effort. No accessory muscle use.  Cardiovascular: Regular rate and rhythm, no murmurs / rubs / gallops. No extremity edema. 2+ pedal pulses. No carotid bruits.  Abdomen: no tenderness, no masses palpated. No hepatosplenomegaly. Bowel sounds positive.  Musculoskeletal: no clubbing / cyanosis. No joint deformity upper and lower extremities. Good ROM, no contractures. Normal muscle tone.  Skin: no rashes, lesions, ulcers. No induration Neurologic: CN 2-12 grossly intact. Sensation intact, DTR normal. Strength 5/5 in all 4.  Psychiatric: dementia. Alert and oriented x 2. Normal mood.     Labs on Admission: I have personally reviewed following labs and imaging studies  CBC: Recent Labs  Lab 07/21/18 0957  WBC 4.9  HGB 4.7*  HCT 16.2*  MCV 79.4*  PLT 852   Basic Metabolic Panel: Recent Labs  Lab 07/21/18 0957  NA 139  K 3.9  CL 107  CO2 25  GLUCOSE 103*  BUN 14  CREATININE 0.76  CALCIUM 8.4*   GFR: Estimated Creatinine Clearance: 62.5 mL/min (by  C-G formula based on SCr of 0.76 mg/dL). Liver Function Tests: Recent Labs  Lab 07/21/18 0957  AST 18  ALT 12  ALKPHOS 77  BILITOT 0.2*  PROT 5.9*  ALBUMIN 3.6   Recent Labs  Lab 07/21/18 0957  LIPASE 26   No results for input(s): AMMONIA in the last 168 hours. Coagulation Profile: Recent Labs  Lab 07/20/18 1421 07/21/18 0957  INR 5.2* 4.0*   Cardiac Enzymes: No results for input(s): CKTOTAL, CKMB, CKMBINDEX, TROPONINI in the last 168 hours. BNP (last 3 results) No results for input(s): PROBNP in the last 8760 hours. HbA1C: No results for input(s): HGBA1C in the last 72 hours. CBG: No results for input(s): GLUCAP in the last 168 hours. Lipid Profile: No results for input(s): CHOL, HDL, LDLCALC, TRIG, CHOLHDL, LDLDIRECT in the last 72 hours. Thyroid Function Tests: No results for input(s): TSH, T4TOTAL, FREET4, T3FREE, THYROIDAB in the last 72 hours. Anemia Panel: No results for input(s): VITAMINB12, FOLATE, FERRITIN,  TIBC, IRON, RETICCTPCT in the last 72 hours. Urine analysis:    Component Value Date/Time   COLORURINE YELLOW 12/01/2015 1300   APPEARANCEUR CLEAR 12/01/2015 1300   LABSPEC 1.017 12/01/2015 1300   PHURINE 5.5 12/01/2015 1300   GLUCOSEU NEGATIVE 12/01/2015 1300   HGBUR NEGATIVE 12/01/2015 1300   BILIRUBINUR NEGATIVE 12/01/2015 1300   KETONESUR NEGATIVE 12/01/2015 1300   PROTEINUR NEGATIVE 12/01/2015 1300   UROBILINOGEN 0.2 06/02/2011 0300   NITRITE NEGATIVE 12/01/2015 1300   LEUKOCYTESUR NEGATIVE 12/01/2015 1300   Sepsis Labs: No results found for this or any previous visit (from the past 240 hour(s)).   Radiological Exams on Admission: Ct Abdomen Pelvis W Contrast  Result Date: 07/21/2018 CLINICAL DATA:  Abdominal pain and nausea. EXAM: CT ABDOMEN AND PELVIS WITH CONTRAST TECHNIQUE: Multidetector CT imaging of the abdomen and pelvis was performed using the standard protocol following bolus administration of intravenous contrast. CONTRAST:   156mL ISOVUE-300 IOPAMIDOL (ISOVUE-300) INJECTION 61% COMPARISON:  CT scan dated 12/10/2016 FINDINGS: Lower chest: Minimal atelectasis at the lung bases posteriorly. Heart size is normal. Minimal pericardial fluid. Hepatobiliary: Extensive small calcified granulomas throughout the liver and spleen. 12 mm enhancing lesion in the right lobe of the liver consistent with a benign hemangioma. Liver parenchyma is otherwise normal. Biliary tree is normal. Pancreas: Unremarkable. No pancreatic ductal dilatation or surrounding inflammatory changes. Spleen: Numerous calcified granulomas in the otherwise normal spleen. Adrenals/Urinary Tract: Normal adrenal glands. Normal appearing kidneys. No hydronephrosis. Bladder is normal. Stomach/Bowel: Stomach is within normal limits. Appendix appears normal. There is extensive stool throughout the colon including a moderate amount of stool in the rectum. The sigmoid portion of the colon is elongated and there is gaseous distention of the sigmoid colon. This is best appreciated on the coronal images. No evidence of bowel wall thickening, distention, or inflammatory changes. There are a few calcifications in the soft tissues adjacent to the hilum of the liver and adjacent to the head of the pancreas consistent calcified lymph nodes. Vascular/Lymphatic: Slight aortic atherosclerosis.  No adenopathy. Reproductive: Prostate is unremarkable. Other: No abdominal wall hernia or abnormality. No abdominopelvic ascites. Musculoskeletal: No acute abnormality. Diffuse degenerative changes in the lumbar spine. Moderate arthritis of both hips. Old healed proximal left femur fracture. IMPRESSION: 1. Extensive stool in the colon. Slight gaseous distention of the sigmoid colon which is elongated and extends almost to the level of the diaphragm. 2. No acute abnormalities. 3.  Aortic Atherosclerosis (ICD10-I70.0). 4. Chronic calcified granulomas in the liver and spleen and upper abdominal lymph nodes  consistent with previous granulomatous disease. Electronically Signed   By: Lorriane Shire [name]   On: 07/21/2018 12:49    EKG: Independently reviewed. Sinus at 82 bpm.  Assessment/Plan Acute blood loss anemia, GI bleed: Patient presents with generalized weakness.  Patient found to have dark black stools on physical exam that were guaiac positive.  Hemoglobin down to 4.7.  Patient typed and screened in order to be transfused 2 units of packed red blood cells.  Last EGD by Dr. Penelope Dominguez in 2017 -Admit to a progressive  -Clear liquid diet   -Continue to monitor hemoglobin and transfuse blood as needed [facility] gastroenterology consulted Dr. Paulita Fujita   Supratherapeutic INR: Acute..  INR elevated at 4 on admission.  Patient was ordered to be transfused 2 units of FFP. -Recheck INR  Seizure disorder  -Continue Keppra  History of CVA  Dementia  GERD, erosive gastropathy -Continue Protonix IV  DVT prophylaxis: SCDs Code Status: Full Family Communication:  No family present at bedside  Disposition Plan: Possible discharge home in 1 to 2 days if hemoglobin stable Consults called: GI Admission status: Observation  Norval Morton MD [facility] Pager [phone]   If 7PM-7AM, please contact night-coverage [email or web address] Password [email or web address]  07/21/2018, 1:33 PM

## 2018-07-22 DIAGNOSIS — D7389 Other diseases of spleen: Secondary | ICD-10-CM | POA: Diagnosis not present

## 2018-07-22 DIAGNOSIS — R1084 Generalized abdominal pain: Secondary | ICD-10-CM | POA: Diagnosis not present

## 2018-07-22 DIAGNOSIS — K922 Gastrointestinal hemorrhage, unspecified: Secondary | ICD-10-CM | POA: Diagnosis present

## 2018-07-22 DIAGNOSIS — G40909 Epilepsy, unspecified, not intractable, without status epilepticus: Secondary | ICD-10-CM | POA: Diagnosis not present

## 2018-07-22 DIAGNOSIS — Z20828 Contact with and (suspected) exposure to other viral communicable diseases: Secondary | ICD-10-CM | POA: Diagnosis not present

## 2018-07-22 DIAGNOSIS — K3189 Other diseases of stomach and duodenum: Secondary | ICD-10-CM | POA: Diagnosis not present

## 2018-07-22 DIAGNOSIS — E755 Other lipid storage disorders: Secondary | ICD-10-CM | POA: Diagnosis present

## 2018-07-22 DIAGNOSIS — D6832 Hemorrhagic disorder due to extrinsic circulating anticoagulants: Secondary | ICD-10-CM | POA: Diagnosis not present

## 2018-07-22 DIAGNOSIS — I10 Essential (primary) hypertension: Secondary | ICD-10-CM | POA: Diagnosis not present

## 2018-07-22 DIAGNOSIS — I700 Atherosclerosis of aorta: Secondary | ICD-10-CM | POA: Diagnosis present

## 2018-07-22 DIAGNOSIS — H9190 Unspecified hearing loss, unspecified ear: Secondary | ICD-10-CM | POA: Diagnosis present

## 2018-07-22 DIAGNOSIS — J329 Chronic sinusitis, unspecified: Secondary | ICD-10-CM | POA: Diagnosis present

## 2018-07-22 DIAGNOSIS — K31811 Angiodysplasia of stomach and duodenum with bleeding: Secondary | ICD-10-CM | POA: Diagnosis not present

## 2018-07-22 DIAGNOSIS — F0280 Dementia in other diseases classified elsewhere without behavioral disturbance: Secondary | ICD-10-CM | POA: Diagnosis not present

## 2018-07-22 DIAGNOSIS — K219 Gastro-esophageal reflux disease without esophagitis: Secondary | ICD-10-CM | POA: Diagnosis present

## 2018-07-22 DIAGNOSIS — D500 Iron deficiency anemia secondary to blood loss (chronic): Secondary | ICD-10-CM | POA: Diagnosis not present

## 2018-07-22 DIAGNOSIS — I4891 Unspecified atrial fibrillation: Secondary | ICD-10-CM | POA: Diagnosis present

## 2018-07-22 DIAGNOSIS — Z7901 Long term (current) use of anticoagulants: Secondary | ICD-10-CM | POA: Diagnosis not present

## 2018-07-22 DIAGNOSIS — R339 Retention of urine, unspecified: Secondary | ICD-10-CM | POA: Diagnosis not present

## 2018-07-22 DIAGNOSIS — K753 Granulomatous hepatitis, not elsewhere classified: Secondary | ICD-10-CM | POA: Diagnosis not present

## 2018-07-22 DIAGNOSIS — K449 Diaphragmatic hernia without obstruction or gangrene: Secondary | ICD-10-CM | POA: Diagnosis present

## 2018-07-22 DIAGNOSIS — Z8679 Personal history of other diseases of the circulatory system: Secondary | ICD-10-CM | POA: Diagnosis not present

## 2018-07-22 DIAGNOSIS — K5900 Constipation, unspecified: Secondary | ICD-10-CM | POA: Diagnosis not present

## 2018-07-22 DIAGNOSIS — I4892 Unspecified atrial flutter: Secondary | ICD-10-CM | POA: Diagnosis not present

## 2018-07-22 DIAGNOSIS — G40009 Localization-related (focal) (partial) idiopathic epilepsy and epileptic syndromes with seizures of localized onset, not intractable, without status epilepticus: Secondary | ICD-10-CM | POA: Diagnosis not present

## 2018-07-22 DIAGNOSIS — K31819 Angiodysplasia of stomach and duodenum without bleeding: Secondary | ICD-10-CM | POA: Diagnosis not present

## 2018-07-22 DIAGNOSIS — K644 Residual hemorrhoidal skin tags: Secondary | ICD-10-CM | POA: Diagnosis present

## 2018-07-22 DIAGNOSIS — R791 Abnormal coagulation profile: Secondary | ICD-10-CM | POA: Diagnosis not present

## 2018-07-22 DIAGNOSIS — R195 Other fecal abnormalities: Secondary | ICD-10-CM | POA: Diagnosis not present

## 2018-07-22 DIAGNOSIS — G309 Alzheimer's disease, unspecified: Secondary | ICD-10-CM | POA: Diagnosis not present

## 2018-07-22 DIAGNOSIS — I4510 Unspecified right bundle-branch block: Secondary | ICD-10-CM | POA: Diagnosis present

## 2018-07-22 DIAGNOSIS — F0390 Unspecified dementia without behavioral disturbance: Secondary | ICD-10-CM | POA: Diagnosis not present

## 2018-07-22 DIAGNOSIS — D62 Acute posthemorrhagic anemia: Secondary | ICD-10-CM | POA: Diagnosis not present

## 2018-07-22 DIAGNOSIS — Z8719 Personal history of other diseases of the digestive system: Secondary | ICD-10-CM | POA: Diagnosis not present

## 2018-07-22 DIAGNOSIS — T45515A Adverse effect of anticoagulants, initial encounter: Secondary | ICD-10-CM | POA: Diagnosis not present

## 2018-07-22 LAB — BASIC METABOLIC PANEL
Anion gap: 6 (ref 5–15)
BUN: 9 mg/dL (ref 8–23)
CO2: 27 mmol/L (ref 22–32)
Calcium: 7.8 mg/dL — ABNORMAL LOW (ref 8.9–10.3)
Chloride: 105 mmol/L (ref 98–111)
Creatinine, Ser: 0.68 mg/dL (ref 0.61–1.24)
GFR calc Af Amer: >60 mL/min (ref >60)
GFR calc non Af Amer: >60 mL/min (ref >60)
Glucose, Bld: 81 mg/dL (ref 70–99)
Potassium: 4 mmol/L (ref 3.5–5.1)
Sodium: 138 mmol/L (ref 135–145)

## 2018-07-22 LAB — PREPARE RBC (CROSSMATCH)

## 2018-07-22 LAB — SARS CORONAVIRUS 2 BY RT PCR (HOSPITAL ORDER, PERFORMED IN ~~LOC~~ HOSPITAL LAB): SARS Coronavirus 2: NEGATIVE

## 2018-07-22 LAB — HEMOGLOBIN AND HEMATOCRIT, BLOOD
HCT: 21.1 % — ABNORMAL LOW (ref 39.0–52.0)
HCT: 24.5 % — ABNORMAL LOW (ref 39.0–52.0)
Hemoglobin: 6.6 g/dL — CL (ref 13.0–17.0)
Hemoglobin: 7.7 g/dL — ABNORMAL LOW (ref 13.0–17.0)

## 2018-07-22 LAB — CBC
HCT: 20.8 % — ABNORMAL LOW (ref 39.0–52.0)
Hemoglobin: 6.5 g/dL — CL (ref 13.0–17.0)
MCH: 24.7 pg — ABNORMAL LOW (ref 26.0–34.0)
MCHC: 31.3 g/dL (ref 30.0–36.0)
MCV: 79.1 fL — ABNORMAL LOW (ref 80.0–100.0)
Platelets: 188 10*3/uL (ref 150–400)
RBC: 2.63 MIL/uL — ABNORMAL LOW (ref 4.22–5.81)
RDW: 19.2 % — ABNORMAL HIGH (ref 11.5–15.5)
WBC: 5.9 10*3/uL (ref 4.0–10.5)
nRBC: 0.3 % — ABNORMAL HIGH (ref 0.0–0.2)

## 2018-07-22 LAB — MRSA PCR SCREENING: MRSA by PCR: NEGATIVE

## 2018-07-22 MED ORDER — SODIUM CHLORIDE 0.9% IV SOLUTION
Freq: Once | INTRAVENOUS | Status: AC
  Administered 2018-07-22: 13:00:00 via INTRAVENOUS

## 2018-07-22 NOTE — Progress Notes (Signed)
PROGRESS NOTE    [name] [facility]  DJS:[id number] DOB: 1941-02-20 DOA: 07/21/2018 PCP: [name], MD    Brief Narrative:  78 year old male who presented with abdominal pain.  He does have significant past medical history for hypertension, atrial flutter, history of CVA, history of seizures and gastrointestinal bleeding.  Reported 2-week history of domino pain, associated with nausea but no chest pain or dyspnea.  Was noted to have dark stools for the last few days per his caregiver.  On physical examination his blood pressure was 113/52, heart rate 91, respirate 18, temperature 98.6, oxygen saturation 93%, his lungs were clear to auscultation bilaterally, heart S1-S2 present and rhythmic, the abdomen is soft, no lower extremity edema.  Sodium 139, potassium 3.9, chloride 107, bicarb 25, glucose 103, BUN 14, creatinine 0.76, white count 4.9, hemoglobin 14.7, hematocrit 16.2, platelet 197, INR 4.0  Patient was admitted to the hospital for working diagnosis of symptomatic anemia due to acute blood loss lower GI bleed.  Assessment & Plan:   Principal Problem:   Gastrointestinal bleeding Active Problems:   Seizure disorder (HCC)   History of cardiovascular disorder   Dementia (AD)   Supratherapeutic INR   1. Acute blood loss anemia, suspected upper GI bleed. Patient is sp PRBC transfusion 2 units, FFP 2 units. Will continue to follow on hgb and hct. Target Hgb above 8 for acute GI bleeding. Continue antiacid therapy with proton pump inh IV. Follow with GI recommendations for upper endoscopy in am. Will repeat Hgb and Hct this pm at 17:00.   2. Seizures. Continue keppra, no active seziures.   3. Dementia. No agitation, continue neuro checks per unit protocol.   4. Hx CVA. Continue neuro checks per unit protocol.   5. Iron deficiency. Continue iron supplementation.   6. Warfarin induced coagulopathy. INR on admission 4,0, patient has received vitamin K and FFP, will follow INR in am.    DVT prophylaxis: scd   Code Status:  full Family Communication: no family at the bedside Disposition Plan/ discharge barriers: pending endoscopy.   Body mass index is 18.36 kg/m. Malnutrition Type:      Malnutrition Characteristics:      Nutrition Interventions:     RN Pressure Injury Documentation: Pressure Injury 12/13/16 Stage II -  Partial thickness loss of dermis presenting as a shallow open ulcer with a red, pink wound bed without slough. Pink and open, placed pink foam; surrounded by dark colored deep tissue injury which has evlved into stage (Active)  12/13/16 0820  Location: Buttocks  Location Orientation: Right  Staging: Stage II -  Partial thickness loss of dermis presenting as a shallow open ulcer with a red, pink wound bed without slough.  Wound Description (Comments): Pink and open, placed pink foam; surrounded by dark colored deep tissue injury which has evlved into stage 2 pressure injury on the right  Present on Admission: No     Consultants:   GI   Procedures:     Antimicrobials:       Subjective: Patient with positive fatigue, no chest pain or frank dyspnea, no hematemesis, or hematochezia, no nausea or vomiting.   Objective: Vitals:   07/22/18 0531 07/22/18 0601 07/22/18 0603 07/22/18 0735  BP: 90/62 (!) 89/53 92/60 107/61  Pulse: 72 72  76  Resp: 12 14  16   Temp: 98.4 F (36.9 C) 97.6 F (36.4 C)  98 F (36.7 C)  TempSrc: Oral Oral  Oral  SpO2: 100% 100%  100%  Weight:  Height:        Intake/Output Summary (Last 24 hours) at 07/22/2018 1051 Last data filed at 07/22/2018 0735 Gross per 24 hour  Intake 2639.97 ml  Output 200 ml  Net 2439.97 ml   Filed Weights   07/21/18 0950 07/21/18 1110 07/21/18 1511  Weight: 57.2 kg 59 kg 61.4 kg    Examination:   General: Not in pain or dyspnea, deconditioned  Neurology: Awake and alert, non focal  E ENT: positive pallor, no icterus, oral mucosa moist Cardiovascular: No JVD.  S1-S2 present, rhythmic, no gallops, rubs, or murmurs. No lower extremity edema. Pulmonary: positive breath sounds bilaterally, adequate air movement, no wheezing, rhonchi or rales. Gastrointestinal. Abdomen with, no organomegaly, non tender, no rebound or guarding Skin. No rashes Musculoskeletal: no joint deformities     Data Reviewed: I have personally reviewed following labs and imaging studies  CBC: Recent Labs  Lab 07/21/18 0957 07/22/18 0307 07/22/18 0958  WBC 4.9 5.9  --   HGB 4.7* 6.5* 6.6*  HCT 16.2* 20.8* 21.1*  MCV 79.4* 79.1*  --   PLT 197 188  --    Basic Metabolic Panel: Recent Labs  Lab 07/21/18 0957 07/22/18 0307  NA 139 138  K 3.9 4.0  CL 107 105  CO2 25 27  GLUCOSE 103* 81  BUN 14 9  CREATININE 0.76 0.68  CALCIUM 8.4* 7.8*   GFR: Estimated Creatinine Clearance: 65 mL/min (by C-G formula based on SCr of 0.68 mg/dL). Liver Function Tests: Recent Labs  Lab 07/21/18 0957  AST 18  ALT 12  ALKPHOS 77  BILITOT 0.2*  PROT 5.9*  ALBUMIN 3.6   Recent Labs  Lab 07/21/18 0957  LIPASE 26   No results for input(s): AMMONIA in the last 168 hours. Coagulation Profile: Recent Labs  Lab 07/20/18 1421 07/21/18 0957 07/21/18 1542  INR 5.2* 4.0* 3.1*   Cardiac Enzymes: No results for input(s): CKTOTAL, CKMB, CKMBINDEX, TROPONINI in the last 168 hours. BNP (last 3 results) No results for input(s): PROBNP in the last 8760 hours. HbA1C: No results for input(s): HGBA1C in the last 72 hours. CBG: No results for input(s): GLUCAP in the last 168 hours. Lipid Profile: No results for input(s): CHOL, HDL, LDLCALC, TRIG, CHOLHDL, LDLDIRECT in the last 72 hours. Thyroid Function Tests: No results for input(s): TSH, T4TOTAL, FREET4, T3FREE, THYROIDAB in the last 72 hours. Anemia Panel: No results for input(s): VITAMINB12, FOLATE, FERRITIN, TIBC, IRON, RETICCTPCT in the last 72 hours.    Radiology Studies: I have reviewed all of the imaging during this  hospital visit personally     Scheduled Meds: . sodium chloride   Intravenous Once  . ferrous sulfate  325 mg Oral TID WC  . levETIRAcetam  1,500 mg Oral BID  . pantoprazole (PROTONIX) IV  40 mg Intravenous Q12H  . polyethylene glycol  17 g Oral BID  . sodium chloride flush  3 mL Intravenous Q12H   Continuous Infusions:   LOS: 0 days        [name] Gerome Apley, MD

## 2018-07-22 NOTE — H&P (View-Only) (Signed)
[facility] Gastroenterology Progress Note  [name] 78 y.o. 27-Mar-1940  CC: Anemia   Subjective: Patient denies any further bleeding.  Last bowel movement last night was brown according to patient. .  Discussed with nursing staff.  Denied mentioning any documented GI bleed.  ROS : Negative for chest pain and shortness of breath.   Objective: Vital signs in last 24 hours: Vitals:   07/22/18 0603 07/22/18 0735  BP: 92/60 107/61  Pulse:  76  Resp:  16  Temp:  98 F (36.7 C)  SpO2:  100%    Physical Exam:  General:  Alert, cooperative, no distress, appears stated age  Head:  Normocephalic, without obvious abnormality, atraumatic  Eyes:  , EOM's intact,   Lungs:   Clear to auscultation bilaterally, respirations unlabored  Heart:  Regular rate and rhythm, S1, S2 normal  Abdomen:   Soft, non-tender, nondistended, bowel sounds present.  No peritoneal signs  Extremities: Extremities normal, atraumatic, no  edema  :     Lab Results: Recent Labs    07/21/18 0957 07/22/18 0307  NA 139 138  K 3.9 4.0  CL 107 105  CO2 25 27  GLUCOSE 103* 81  BUN 14 9  CREATININE 0.76 0.68  CALCIUM 8.4* 7.8*   Recent Labs    07/21/18 0957  AST 18  ALT 12  ALKPHOS 77  BILITOT 0.2*  PROT 5.9*  ALBUMIN 3.6   Recent Labs    07/21/18 0957 07/22/18 0307  WBC 4.9 5.9  HGB 4.7* 6.5*  HCT 16.2* 20.8*  MCV 79.4* 79.1*  PLT 197 188   Recent Labs    07/21/18 0957 07/21/18 1542  LABPROT 38.3* 31.6*  INR 4.0* 3.1*      Assessment/Plan: -Anemia with heme positive stool.  Most likely chronic blood loss anemia. -Lower abdominal pain.  Resolved.  CT scan negative.  Most likely from constipation. -History of CVA on chronic anticoagulation.  Last INR yesterday was 3.1.  Recommendations ----------------------- -Transfuse to keep hemoglobin around 7-8. -Recheck CBC and INR in the morning. - Plan For EGD tomorrow if hemoglobin more than 7 and INR around 2. -Okay to have full  liquid diet today. -GI will follow  Risks (bleeding, infection, bowel perforation that could require surgery, sedation-related changes in cardiopulmonary systems), benefits (identification and possible treatment of source of symptoms, exclusion of certain causes of symptoms), and alternatives (watchful waiting, radiographic imaging studies, empiric medical treatment)  were explained to patient/family in detail and patient wishes to proceed.  Otis Brace MD, FACP 07/22/2018, 9:01 AM  Contact #  [phone]

## 2018-07-22 NOTE — Progress Notes (Signed)
[facility] Gastroenterology Progress Note  [name] 78 y.o. 07-Jan-1941  CC: Anemia   Subjective: Patient denies any further bleeding.  Last bowel movement last night was brown according to patient. .  Discussed with nursing staff.  Denied mentioning any documented GI bleed.  ROS : Negative for chest pain and shortness of breath.   Objective: Vital signs in last 24 hours: Vitals:   07/22/18 0603 07/22/18 0735  BP: 92/60 107/61  Pulse:  76  Resp:  16  Temp:  98 F (36.7 C)  SpO2:  100%    Physical Exam:  General:  Alert, cooperative, no distress, appears stated age  Head:  Normocephalic, without obvious abnormality, atraumatic  Eyes:  , EOM's intact,   Lungs:   Clear to auscultation bilaterally, respirations unlabored  Heart:  Regular rate and rhythm, S1, S2 normal  Abdomen:   Soft, non-tender, nondistended, bowel sounds present.  No peritoneal signs  Extremities: Extremities normal, atraumatic, no  edema  :     Lab Results: Recent Labs    07/21/18 0957 07/22/18 0307  NA 139 138  K 3.9 4.0  CL 107 105  CO2 25 27  GLUCOSE 103* 81  BUN 14 9  CREATININE 0.76 0.68  CALCIUM 8.4* 7.8*   Recent Labs    07/21/18 0957  AST 18  ALT 12  ALKPHOS 77  BILITOT 0.2*  PROT 5.9*  ALBUMIN 3.6   Recent Labs    07/21/18 0957 07/22/18 0307  WBC 4.9 5.9  HGB 4.7* 6.5*  HCT 16.2* 20.8*  MCV 79.4* 79.1*  PLT 197 188   Recent Labs    07/21/18 0957 07/21/18 1542  LABPROT 38.3* 31.6*  INR 4.0* 3.1*      Assessment/Plan: -Anemia with heme positive stool.  Most likely chronic blood loss anemia. -Lower abdominal pain.  Resolved.  CT scan negative.  Most likely from constipation. -History of CVA on chronic anticoagulation.  Last INR yesterday was 3.1.  Recommendations ----------------------- -Transfuse to keep hemoglobin around 7-8. -Recheck CBC and INR in the morning. - Plan For EGD tomorrow if hemoglobin more than 7 and INR around 2. -Okay to have full  liquid diet today. -GI will follow  Risks (bleeding, infection, bowel perforation that could require surgery, sedation-related changes in cardiopulmonary systems), benefits (identification and possible treatment of source of symptoms, exclusion of certain causes of symptoms), and alternatives (watchful waiting, radiographic imaging studies, empiric medical treatment)  were explained to patient/family in detail and patient wishes to proceed.  Otis Brace MD, FACP 07/22/2018, 9:01 AM  Contact #  [phone]

## 2018-07-23 ENCOUNTER — Encounter (HOSPITAL_COMMUNITY)
Admission: EM | Disposition: A | Discharge status: Home / Self Care | Payer: Self-pay | Source: Home / Self Care | Attending: Internal Medicine

## 2018-07-23 ENCOUNTER — Inpatient Hospital Stay (HOSPITAL_COMMUNITY): Payer: Medicare Other | Admitting: Certified Registered Nurse Anesthetist

## 2018-07-23 ENCOUNTER — Encounter (HOSPITAL_COMMUNITY): Payer: Self-pay | Admitting: *Deleted

## 2018-07-23 HISTORY — PX: HOT HEMOSTASIS: SHX5433

## 2018-07-23 HISTORY — PX: ESOPHAGOGASTRODUODENOSCOPY (EGD) WITH PROPOFOL: SHX5813

## 2018-07-23 LAB — CBC
HCT: 27.1 % — ABNORMAL LOW (ref 39.0–52.0)
Hemoglobin: 8.7 g/dL — ABNORMAL LOW (ref 13.0–17.0)
MCH: 25.6 pg — ABNORMAL LOW (ref 26.0–34.0)
MCHC: 32.1 g/dL (ref 30.0–36.0)
MCV: 79.7 fL — ABNORMAL LOW (ref 80.0–100.0)
Platelets: 172 10*3/uL (ref 150–400)
RBC: 3.4 MIL/uL — ABNORMAL LOW (ref 4.22–5.81)
RDW: 18.2 % — ABNORMAL HIGH (ref 11.5–15.5)
WBC: 6.3 10*3/uL (ref 4.0–10.5)
nRBC: 0.3 % — ABNORMAL HIGH (ref 0.0–0.2)

## 2018-07-23 LAB — BASIC METABOLIC PANEL
Anion gap: 7 (ref 5–15)
BUN: 7 mg/dL — ABNORMAL LOW (ref 8–23)
CO2: 27 mmol/L (ref 22–32)
Calcium: 8 mg/dL — ABNORMAL LOW (ref 8.9–10.3)
Chloride: 105 mmol/L (ref 98–111)
Creatinine, Ser: 0.66 mg/dL (ref 0.61–1.24)
GFR calc Af Amer: >60 mL/min (ref >60)
GFR calc non Af Amer: >60 mL/min (ref >60)
Glucose, Bld: 86 mg/dL (ref 70–99)
Potassium: 3.7 mmol/L (ref 3.5–5.1)
Sodium: 139 mmol/L (ref 135–145)

## 2018-07-23 LAB — PREPARE FRESH FROZEN PLASMA
Unit division: 0
Unit division: 0

## 2018-07-23 LAB — BPAM FFP
Blood Product Expiration Date: 202005052359
Blood Product Expiration Date: 202005052359
ISSUE DATE / TIME: 202005020350
ISSUE DATE / TIME: 202005020539
Unit Type and Rh: 5100
Unit Type and Rh: 5100

## 2018-07-23 LAB — PROTIME-INR
INR: 1.4 — ABNORMAL HIGH (ref 0.8–1.2)
Prothrombin Time: 17 seconds — ABNORMAL HIGH (ref 11.4–15.2)

## 2018-07-23 SURGERY — ESOPHAGOGASTRODUODENOSCOPY (EGD) WITH PROPOFOL
Anesthesia: Monitor Anesthesia Care

## 2018-07-23 MED ORDER — MORPHINE SULFATE (PF) 2 MG/ML IV SOLN
1.0000 mg | INTRAVENOUS | Status: DC | PRN
  Administered 2018-07-23: 1 mg via INTRAVENOUS
  Filled 2018-07-23: qty 1

## 2018-07-23 MED ORDER — PROPOFOL 10 MG/ML IV BOLUS
INTRAVENOUS | Status: DC | PRN
  Administered 2018-07-23 (×2): 20 mg via INTRAVENOUS

## 2018-07-23 MED ORDER — PROPOFOL 500 MG/50ML IV EMUL
INTRAVENOUS | Status: DC | PRN
  Administered 2018-07-23: 100 ug/kg/min via INTRAVENOUS

## 2018-07-23 MED ORDER — ACETAMINOPHEN 325 MG PO TABS
650.0000 mg | ORAL_TABLET | Freq: Four times a day (QID) | ORAL | Status: DC | PRN
  Administered 2018-07-23 – 2018-07-24 (×2): 650 mg via ORAL
  Filled 2018-07-23 (×3): qty 2

## 2018-07-23 MED ORDER — LACTATED RINGERS IV SOLN
INTRAVENOUS | Status: DC | PRN
  Administered 2018-07-23: 09:00:00 via INTRAVENOUS

## 2018-07-23 MED ORDER — ONDANSETRON HCL 4 MG/2ML IJ SOLN
4.0000 mg | Freq: Four times a day (QID) | INTRAMUSCULAR | Status: DC | PRN
  Administered 2018-07-23: 4 mg via INTRAVENOUS
  Filled 2018-07-23: qty 2

## 2018-07-23 SURGICAL SUPPLY — 15 items

## 2018-07-23 NOTE — Anesthesia Preprocedure Evaluation (Addendum)
Anesthesia Evaluation  Patient identified by MRN, date of birth, ID band Patient awake    Reviewed: Allergy & Precautions, NPO status , Patient's Chart, lab work & pertinent test results  Airway Mallampati: I  TM Distance: >3 FB Neck ROM: Full    Dental  (+) Teeth Intact, Dental Advisory Given   Pulmonary former smoker,    breath sounds clear to auscultation       Cardiovascular hypertension, + dysrhythmias Atrial Fibrillation  Rhythm:Regular Rate:Normal     Neuro/Psych Seizures -,  Dementia CVA    GI/Hepatic Neg liver ROS, hiatal hernia,   Endo/Other  negative endocrine ROS  Renal/GU negative Renal ROS     Musculoskeletal negative musculoskeletal ROS (+)   Abdominal Normal abdominal exam  (+)   Peds  Hematology   Anesthesia Other Findings   Reproductive/Obstetrics                            Anesthesia Physical Anesthesia Plan  ASA: III  Anesthesia Plan: MAC   Post-op Pain Management:    Induction: Intravenous  PONV Risk Score and Plan: 2 and Ondansetron, Treatment may vary due to age or medical condition and Propofol infusion  Airway Management Planned: Simple Face Mask and Nasal Cannula  Additional Equipment: None  Intra-op Plan:   Post-operative Plan:   Informed Consent: I have reviewed the patients History and Physical, chart, labs and discussed the procedure including the risks, benefits and alternatives for the proposed anesthesia with the patient or authorized representative who has indicated his/her understanding and acceptance.     Dental advisory given  Plan Discussed with: CRNA  Anesthesia Plan Comments:        Anesthesia Quick Evaluation

## 2018-07-23 NOTE — Anesthesia Postprocedure Evaluation (Signed)
Anesthesia Post Note  Patient: [name]  Procedure(s) Performed: ESOPHAGOGASTRODUODENOSCOPY (EGD) WITH PROPOFOL (N/A ) HOT HEMOSTASIS (ARGON PLASMA COAGULATION/BICAP) (N/A )     Patient location during evaluation: PACU Anesthesia Type: MAC Level of consciousness: awake and alert Pain management: pain level controlled Vital Signs Assessment: post-procedure vital signs reviewed and stable Respiratory status: spontaneous breathing, nonlabored ventilation, respiratory function stable and patient connected to nasal cannula oxygen Cardiovascular status: stable and blood pressure returned to baseline Postop Assessment: no apparent nausea or vomiting Anesthetic complications: no    Last Vitals:  Vitals:   07/23/18 0928 07/23/18 0940  BP: (!) 94/34 (!) 106/58  Pulse: 83   Resp: 20 20  Temp: 36.9 C   SpO2: 97% 100%    Last Pain:  Vitals:   07/23/18 0940  TempSrc:   PainSc: 0-No pain                 Effie Berkshire

## 2018-07-23 NOTE — Brief Op Note (Addendum)
07/21/2018 - 07/23/2018  9:30 AM  PATIENT:  [name]  78 y.o. male  PRE-OPERATIVE DIAGNOSIS:  Anemia, GI bleed  POST-OPERATIVE DIAGNOSIS:  small bowel x 1 and gastric x2  AVMs  APCd  PROCEDURE:  Procedure(s): ESOPHAGOGASTRODUODENOSCOPY (EGD) WITH PROPOFOL (N/A) HOT HEMOSTASIS (ARGON PLASMA COAGULATION/BICAP) (N/A)  SURGEON:  Surgeon(s) and Role:    * Tristen Pennino, MD - Primary  Findings ----------- -2 AVMs in the stomach with contact bleeding.  Treated with APC.  1 AVM in D2 also with contact bleeding and was treated with APC.  Recommendations -------------------------- -Start clear liquid diet. - Monitor H&H. -Hold off on capsule endoscopy today as today's bleeding may make false sense of bleeding further into the small bowel as blood flows through the small bowel. -Plan for capsule endoscopy tomorrow.  Recent CT scan negative for any stricture or narrowing.  Procedure capsule endoscopy discussed with the patient including risk of capsule retention. -GI will follow  Otis Brace MD, [place] 07/23/2018, 9:44 AM  Contact #  [phone]

## 2018-07-23 NOTE — Transfer of Care (Signed)
Immediate Anesthesia Transfer of Care Note  Patient: [name]  Procedure(s) Performed: ESOPHAGOGASTRODUODENOSCOPY (EGD) WITH PROPOFOL (N/A ) HOT HEMOSTASIS (ARGON PLASMA COAGULATION/BICAP) (N/A )  Patient Location: Endoscopy Unit  Anesthesia Type:MAC  Level of Consciousness: awake  Airway & Oxygen Therapy: Patient Spontanous Breathing and Patient connected to nasal cannula oxygen  Post-op Assessment: Report given to RN  Post vital signs: Reviewed and stable  Last Vitals:  Vitals Value Taken Time  BP    Temp    Pulse    Resp    SpO2      Last Pain:  Vitals:   07/23/18 0821  TempSrc: Oral  PainSc: 0-No pain         Complications: No apparent anesthesia complications

## 2018-07-23 NOTE — Op Note (Signed)
[name] Patient Name: [name] Procedure Date : 07/23/2018 MRN: [id number] Attending MD: Otis Brace , MD Date of Birth: 07/31/1940 CSN: [id number] Age: 78 Admit Type: Inpatient Procedure:                Upper GI endoscopy Indications:              Suspected upper gastrointestinal bleeding,                            Suspected upper gastrointestinal bleeding in                            patient with chronic blood loss Providers:                Otis Brace, MD, Carlyn Reichert, RN, Ladona Ridgel, Technician, Charolette Child, Technician,                            Clearnce Sorrel, CRNA Referring MD:              Medicines:                Sedation Administered by an Anesthesia Professional Complications:            No immediate complications. Estimated Blood Loss:     Estimated blood loss was minimal. Procedure:                Pre-Anesthesia Assessment:                           - Prior to the procedure, a History and Physical                            was performed, and patient medications and                            allergies were reviewed. The patient's tolerance of                            previous anesthesia was also reviewed. The risks                            and benefits of the procedure and the sedation                            options and risks were discussed with the patient.                            All questions were answered, and informed consent                            was obtained. Prior Anticoagulants: The patient has  taken Coumadin (warfarin). ASA Grade Assessment:                            III - A patient with severe systemic disease. After                            reviewing the risks and benefits, the patient was                            deemed in satisfactory condition to undergo the                            procedure.                           After obtaining  informed consent, the endoscope was                            passed under direct vision. Throughout the                            procedure, the patient's blood pressure, pulse, and                            oxygen saturations were monitored continuously. The                            GIF-H190 ([id number]) Olympus gastroscope was                            introduced through the mouth, and advanced to the                            second part of duodenum. The upper GI endoscopy was                            accomplished without difficulty. The patient                            tolerated the procedure well. Scope In: Scope Out: Findings:      The Z-line was regular and was found 38 cm from the incisors.      The exam of the esophagus was otherwise normal.      Two small angioectasias with contact bleeding were found in the gastric       body and in the gastric antrum. Fulguration to stop the bleeding by       argon plasma was successful.      The cardia and gastric fundus were normal on retroflexion.      One medium angioectasia with bleeding on contact was found in the second       portion of the duodenum. Fulguration to stop the bleeding by argon       plasma was successful.      The duodenal bulb and first portion of the duodenum were normal. Impression:               -  Z-line regular, 38 cm from the incisors.                           - Two bleeding angioectasias in the stomach.                            Treated with argon plasma coagulation (APC).                           - One angioectasia in the duodenum. Treated with                            argon plasma coagulation (APC).                           - Normal duodenal bulb and first portion of the                            duodenum.                           - No specimens collected. Recommendation:           - Return patient to hospital ward for ongoing care.                           - Clear liquid diet.                            - Continue present medications. Procedure Code(s):        --- Professional ---                           [phone], Esophagogastroduodenoscopy, flexible,                            transoral; with control of bleeding, any method Diagnosis Code(s):        --- Professional ---                           H82.993, Angiodysplasia of stomach and duodenum                            with bleeding                           R58, Hemorrhage, not elsewhere classified CPT copyright 2019 [employer] Medical Association. All rights reserved. The codes documented in this report are preliminary and upon coder review may  be revised to meet current compliance requirements. Otis Brace, MD Otis Brace, MD 07/23/2018 9:29:24 AM Number of Addenda: 0

## 2018-07-23 NOTE — Progress Notes (Signed)
RN notified MD, pt with new onset of confusion (calling for help, asking for mother & asking to be taken to the hospital). Pt vital signs taken, and is found to have a temp. Scanned bladder & pt was retaining 205 after an attempt to void. Pt mildly diaphoretic, MD made aware of all changes, and orders received. Bed alarm on, and floor mats implemented. Will continue to monitor.

## 2018-07-23 NOTE — Progress Notes (Signed)
Patient with confusion and abdominal pain. Noted to have urinary retention. Will order a Foley cathter, as needed acetaminophen, zofran and morphine.   I was not able to reach his daughter over the phone, message left.

## 2018-07-23 NOTE — Progress Notes (Signed)
I placed the foley in 5W [address] and he still have a lot of abdominal tenderness that was not present in early today. Unable to give morphine due to lost of IV , IV team paged.

## 2018-07-23 NOTE — Interval H&P Note (Signed)
History and Physical Interval Note:  07/23/2018 8:29 AM  [name]  has presented today for surgery, with the diagnosis of Anemia, GI bleed.  The various methods of treatment have been discussed with the patient and family. After consideration of risks, benefits and other options for treatment, the patient has consented to  Procedure(s): ESOPHAGOGASTRODUODENOSCOPY (EGD) WITH PROPOFOL (N/A) as a surgical intervention.  The patient's history has been reviewed, patient examined, no change in status, stable for surgery.  I have reviewed the patient's chart and labs.  Questions were answered to the patient's satisfaction.    Risks (bleeding, infection, bowel perforation that could require surgery, sedation-related changes in cardiopulmonary systems), benefits (identification and possible treatment of source of symptoms, exclusion of certain causes of symptoms), and alternatives (watchful waiting, radiographic imaging studies, empiric medical treatment)  were explained to patient/family in detail and patient wishes to proceed.  Tolbert Matheson

## 2018-07-23 NOTE — Progress Notes (Signed)
PROGRESS NOTE    Ravindra Baranek [facility]  PJK:[id number] DOB: March 05, 1941 DOA: 07/21/2018 PCP: [name], MD    Brief Narrative:  78 year old male who presented with abdominal pain.  He does have significant past medical history for hypertension, atrial flutter, history of CVA, history of seizures and gastrointestinal bleeding.  Reported 2-week history of domino pain, associated with nausea but no chest pain or dyspnea.  Was noted to have dark stools for the last few days per his caregiver.  On physical examination his blood pressure was 113/52, heart rate 91, respirate 18, temperature 98.6, oxygen saturation 93%, his lungs were clear to auscultation bilaterally, heart S1-S2 present and rhythmic, the abdomen is soft, no lower extremity edema.  Sodium 139, potassium 3.9, chloride 107, bicarb 25, glucose 103, BUN 14, creatinine 0.76, white count 4.9, hemoglobin 14.7, hematocrit 16.2, platelet 197, INR 4.0  Patient was admitted to the hospital for working diagnosis of symptomatic anemia due to acute blood loss lower GI bleed.   Assessment & Plan:   Principal Problem:   Gastrointestinal bleeding Active Problems:   Seizure disorder (HCC)   History of cardiovascular disorder   Dementia (AD)   Supratherapeutic INR   GIB (gastrointestinal bleeding)   1. Acute blood loss anemia, suspected upper GI bleed. Total patient is sp 4 units PRBC, his Hgb this am at 8.7 with Hct at 27,1. Underwent endoscopy with findings 2 AVMs in the stomach with contact bleeding, treated with APC, AVM in D2 treated with APC. Will continue antiacid therapy with pantoprazole IV, follow cell count in am. Patient may need further workup with capsule endoscopy, will follow GI recommendations.   2. Seizures. On keppra, with no active seziures.   3. Dementia. No agitation.   4. Hx CVA. Consult physical therapy evaluation. Continue to hold on warfarin.   5. Iron deficiency. On po iron supplementation.   6. Warfarin  induced coagulopathy. INR this am down to 1,4. Patient has received ffp and vitamin K on admission. Continue to hold on warfarin.   DVT prophylaxis: scd   Code Status:  full Family Communication: no family at the bedside Disposition Plan/ discharge barriers: pending endoscopy.  Body mass index is 18.36 kg/m. Malnutrition Type:      Malnutrition Characteristics:      Nutrition Interventions:     RN Pressure Injury Documentation:    Consultants:   GI   Procedures:   Upper endoscopy 05/03  Antimicrobials:       Subjective: Patient underwent endoscopy this am, no further melena or hematemesis. No nausea or vomiting, no chest pain or dyspnea, continue to be very weak and deconditioned.   Objective: Vitals:   07/23/18 0223 07/23/18 0821 07/23/18 0928 07/23/18 0940  BP: 94/63 122/62 (!) 94/34 (!) 106/58  Pulse: 73  83   Resp: 18 18 20 20   Temp: 98.5 F (36.9 C) 99.4 F (37.4 C) 98.4 F (36.9 C)   TempSrc: Oral Oral Oral   SpO2: 100% 96% 97% 100%  Weight:  61.4 kg    Height:  6' (1.829 m)      Intake/Output Summary (Last 24 hours) at 07/23/2018 1052 Last data filed at 07/23/2018 0920 Gross per 24 hour  Intake 956.24 ml  Output 1450 ml  Net -493.76 ml   Filed Weights   07/21/18 1110 07/21/18 1511 07/23/18 0821  Weight: 59 kg 61.4 kg 61.4 kg    Examination:   General:deconditioned and ill looking appearing  Neurology: Awake and alert, non focal  E ENT: positive pallor, no icterus, oral mucosa moist Cardiovascular: No JVD. S1-S2 present, rhythmic, no gallops, rubs, or murmurs. No lower extremity edema. Pulmonary: positive breath sounds bilaterally, adequate air movement, no wheezing, rhonchi or rales. Gastrointestinal. Abdomen mild distended with no organomegaly, non tender, no rebound or guarding Skin. No rashes Musculoskeletal: no joint deformities     Data Reviewed: I have personally reviewed following labs and imaging studies  CBC: Recent  Labs  Lab 07/21/18 0957 07/22/18 0307 07/22/18 0958 07/22/18 1709 07/23/18 0603  WBC 4.9 5.9  --   --  6.3  HGB 4.7* 6.5* 6.6* 7.7* 8.7*  HCT 16.2* 20.8* 21.1* 24.5* 27.1*  MCV 79.4* 79.1*  --   --  79.7*  PLT 197 188  --   --  357   Basic Metabolic Panel: Recent Labs  Lab 07/21/18 0957 07/22/18 0307 07/23/18 0603  NA 139 138 139  K 3.9 4.0 3.7  CL 107 105 105  CO2 25 27 27   GLUCOSE 103* 81 86  BUN 14 9 7*  CREATININE 0.76 0.68 0.66  CALCIUM 8.4* 7.8* 8.0*   GFR: Estimated Creatinine Clearance: 65 mL/min (by C-G formula based on SCr of 0.66 mg/dL). Liver Function Tests: Recent Labs  Lab 07/21/18 0957  AST 18  ALT 12  ALKPHOS 77  BILITOT 0.2*  PROT 5.9*  ALBUMIN 3.6   Recent Labs  Lab 07/21/18 0957  LIPASE 26   No results for input(s): AMMONIA in the last 168 hours. Coagulation Profile: Recent Labs  Lab 07/20/18 1421 07/21/18 0957 07/21/18 1542 07/23/18 0603  INR 5.2* 4.0* 3.1* 1.4*   Cardiac Enzymes: No results for input(s): CKTOTAL, CKMB, CKMBINDEX, TROPONINI in the last 168 hours. BNP (last 3 results) No results for input(s): PROBNP in the last 8760 hours. HbA1C: No results for input(s): HGBA1C in the last 72 hours. CBG: No results for input(s): GLUCAP in the last 168 hours. Lipid Profile: No results for input(s): CHOL, HDL, LDLCALC, TRIG, CHOLHDL, LDLDIRECT in the last 72 hours. Thyroid Function Tests: No results for input(s): TSH, T4TOTAL, FREET4, T3FREE, THYROIDAB in the last 72 hours. Anemia Panel: No results for input(s): VITAMINB12, FOLATE, FERRITIN, TIBC, IRON, RETICCTPCT in the last 72 hours.    Radiology Studies: I have reviewed all of the imaging during this hospital visit personally     Scheduled Meds: . ferrous sulfate  325 mg Oral TID WC  . levETIRAcetam  1,500 mg Oral BID  . pantoprazole (PROTONIX) IV  40 mg Intravenous Q12H  . polyethylene glycol  17 g Oral BID  . sodium chloride flush  3 mL Intravenous Q12H    Continuous Infusions:   LOS: 1 day        Ha Placeres Gerome Apley, MD

## 2018-07-24 ENCOUNTER — Encounter (HOSPITAL_COMMUNITY)
Admission: EM | Disposition: A | Discharge status: Home / Self Care | Payer: Self-pay | Source: Home / Self Care | Attending: Internal Medicine

## 2018-07-24 ENCOUNTER — Encounter (HOSPITAL_COMMUNITY): Payer: Self-pay

## 2018-07-24 HISTORY — PX: GIVENS CAPSULE STUDY: SHX5432

## 2018-07-24 LAB — TYPE AND SCREEN
ABO/RH(D): O NEG
Antibody Screen: NEGATIVE
Unit division: 0
Unit division: 0
Unit division: 0
Unit division: 0

## 2018-07-24 LAB — CBC WITH DIFFERENTIAL/PLATELET
Abs Immature Granulocytes: 0.06 10*3/uL (ref 0.00–0.07)
Basophils Absolute: 0.1 10*3/uL (ref 0.0–0.1)
Basophils Relative: 1 %
Eosinophils Absolute: 0.1 10*3/uL (ref 0.0–0.5)
Eosinophils Relative: 1 %
HCT: 27.2 % — ABNORMAL LOW (ref 39.0–52.0)
Hemoglobin: 8.7 g/dL — ABNORMAL LOW (ref 13.0–17.0)
Immature Granulocytes: 1 %
Lymphocytes Relative: 9 %
Lymphs Abs: 0.8 10*3/uL (ref 0.7–4.0)
MCH: 25.7 pg — ABNORMAL LOW (ref 26.0–34.0)
MCHC: 32 g/dL (ref 30.0–36.0)
MCV: 80.2 fL (ref 80.0–100.0)
Monocytes Absolute: 1.6 10*3/uL — ABNORMAL HIGH (ref 0.1–1.0)
Monocytes Relative: 18 %
Neutro Abs: 6.5 10*3/uL (ref 1.7–7.7)
Neutrophils Relative %: 70 %
Platelets: 178 10*3/uL (ref 150–400)
RBC: 3.39 MIL/uL — ABNORMAL LOW (ref 4.22–5.81)
RDW: 18.6 % — ABNORMAL HIGH (ref 11.5–15.5)
WBC: 9.2 10*3/uL (ref 4.0–10.5)
nRBC: 0.2 % (ref 0.0–0.2)

## 2018-07-24 LAB — BASIC METABOLIC PANEL
Anion gap: 7 (ref 5–15)
BUN: 6 mg/dL — ABNORMAL LOW (ref 8–23)
CO2: 26 mmol/L (ref 22–32)
Calcium: 7.9 mg/dL — ABNORMAL LOW (ref 8.9–10.3)
Chloride: 105 mmol/L (ref 98–111)
Creatinine, Ser: 0.73 mg/dL (ref 0.61–1.24)
GFR calc Af Amer: >60 mL/min (ref >60)
GFR calc non Af Amer: >60 mL/min (ref >60)
Glucose, Bld: 91 mg/dL (ref 70–99)
Potassium: 3.5 mmol/L (ref 3.5–5.1)
Sodium: 138 mmol/L (ref 135–145)

## 2018-07-24 LAB — BPAM RBC
Blood Product Expiration Date: 202005032359
Blood Product Expiration Date: 202005072359
Blood Product Expiration Date: 202005132359
Blood Product Expiration Date: 202005172359
ISSUE DATE / TIME: 202005011705
ISSUE DATE / TIME: 202005012117
ISSUE DATE / TIME: 202005021235
ISSUE DATE / TIME: 202005030009
Unit Type and Rh: 5100
Unit Type and Rh: 5100
Unit Type and Rh: 5100
Unit Type and Rh: 9500

## 2018-07-24 SURGERY — IMAGING PROCEDURE, GI TRACT, INTRALUMINAL, VIA CAPSULE

## 2018-07-24 NOTE — Progress Notes (Signed)
PROGRESS NOTE    [name] [facility]  QPY:[id number] DOB: 1941-03-11 DOA: 07/21/2018 PCP: [name], MD    Brief Narrative:  78 year old male who presented with abdominal pain. He does have significant past medical history for hypertension, atrial flutter, history of CVA, history of seizures and gastrointestinal bleeding. Reported 2-week history of domino pain,associated with nausea but no chest pain or dyspnea. Was noted to have dark stools for the last few days per his caregiver. On physical examination his blood pressure was 113/52, heart rate 91, respirate 18, temperature 98.6, oxygen saturation 93%, his lungs were clear to auscultation bilaterally, heart S1-S2 present and rhythmic, the abdomen is soft, no lower extremity edema. Sodium 139, potassium 3.9, chloride 107, bicarb 25, glucose 103, BUN 14, creatinine 0.76, white count 4.9, hemoglobin 14.7, hematocrit 16.2, platelet 197,INR 4.0  Patient was admitted to the hospital for working diagnosis of symptomatic anemia due to acute blood loss lower GI bleed.    Assessment & Plan:   Principal Problem:   Gastrointestinal bleeding Active Problems:   Seizure disorder (HCC)   History of cardiovascular disorder   Dementia (AD)   Supratherapeutic INR   GIB (gastrointestinal bleeding)   1. Acute blood loss anemia, suspected upper GI bleed. Total patient is sp 4 units PRBC. Sp endoscopy with findings 2 AVMs.  in the stomach and AVM in D2 treated with APC.  Patient tolerating po well, no further abdominal pain. Underwent today capsule endoscopy for further evaluation. Continue with IV pantoprazole and supportive IV fluids. Continue close follow up of H&H. Patient continue to be at risk of recurrent bleeding.   2. Seizures. Continue with keppra, with no active seziures.   3. Dementia. This am with no confusion, but had confusion and disorientation yesterday pm.    4. Hx CVA. Continue neuro checks per unit protocol.  5. Iron  deficiency. Continue po iron.  6. Atrial flutter. Rate continue to be controlled, continue telemetry monitoring for now. Holding anticoagulation due to active GI bleeding.   7. New urinary retention. Foley catheter has been placed. Will plan to do a voiding trial in am.   DVT prophylaxis:scd Code Status:full Family Communication:no family at the bedside Disposition Plan/ discharge barriers:pending endoscopy. Body mass index is 18.36 kg/m.  Body mass index is 18.36 kg/m. Malnutrition Type:      Malnutrition Characteristics:      Nutrition Interventions:     RN Pressure Injury Documentation:     Consultants:   GI   Procedures:   EGD   Capsule endoscopy   Antimicrobials:       Subjective: Patient is feeling better, bot not yet back to baseline, yesterday had abdominal pain and urinary retention, that have improved.   Objective: Vitals:   07/24/18 0503 07/24/18 0632 07/24/18 0830 07/24/18 0832  BP: 107/64  110/68   Pulse: 90  90   Resp:   18   Temp: (!) 100.5 F (38.1 C) 99.7 F (37.6 C) 99.7 F (37.6 C)   TempSrc: Oral Oral Oral   SpO2: 96%  97%   Weight:    61.4 kg  Height:    6' (1.829 m)    Intake/Output Summary (Last 24 hours) at 07/24/2018 0910 Last data filed at 07/24/2018 0842 Gross per 24 hour  Intake 203 ml  Output 2001 ml  Net -1798 ml   Filed Weights   07/21/18 1511 07/23/18 0821 07/24/18 0832  Weight: 61.4 kg 61.4 kg 61.4 kg    Examination:  General: Not in pain or dyspnea, deconditioned  Neurology: Awake and alert, non focal  E ENT: mild pallor, no icterus, oral mucosa moist Cardiovascular: No JVD. S1-S2 present, rhythmic, no gallops, rubs, or murmurs. No lower extremity edema. Pulmonary: positive breath sounds bilaterally, adequate air movement, no wheezing, rhonchi or rales. Gastrointestinal. Abdomen with no organomegaly, non tender, no rebound or guarding Skin. No rashes Musculoskeletal: no joint deformities      Data Reviewed: I have personally reviewed following labs and imaging studies  CBC: Recent Labs  Lab 07/21/18 0957 07/22/18 0307 07/22/18 0958 07/22/18 1709 07/23/18 0603 07/24/18 0342  WBC 4.9 5.9  --   --  6.3 9.2  NEUTROABS  --   --   --   --   --  6.5  HGB 4.7* 6.5* 6.6* 7.7* 8.7* 8.7*  HCT 16.2* 20.8* 21.1* 24.5* 27.1* 27.2*  MCV 79.4* 79.1*  --   --  79.7* 80.2  PLT 197 188  --   --  172 354   Basic Metabolic Panel: Recent Labs  Lab 07/21/18 0957 07/22/18 0307 07/23/18 0603 07/24/18 0342  NA 139 138 139 138  K 3.9 4.0 3.7 3.5  CL 107 105 105 105  CO2 25 27 27 26   GLUCOSE 103* 81 86 91  BUN 14 9 7* 6*  CREATININE 0.76 0.68 0.66 0.73  CALCIUM 8.4* 7.8* 8.0* 7.9*   GFR: Estimated Creatinine Clearance: 65 mL/min (by C-G formula based on SCr of 0.73 mg/dL). Liver Function Tests: Recent Labs  Lab 07/21/18 0957  AST 18  ALT 12  ALKPHOS 77  BILITOT 0.2*  PROT 5.9*  ALBUMIN 3.6   Recent Labs  Lab 07/21/18 0957  LIPASE 26   No results for input(s): AMMONIA in the last 168 hours. Coagulation Profile: Recent Labs  Lab 07/20/18 1421 07/21/18 0957 07/21/18 1542 07/23/18 0603  INR 5.2* 4.0* 3.1* 1.4*   Cardiac Enzymes: No results for input(s): CKTOTAL, CKMB, CKMBINDEX, TROPONINI in the last 168 hours. BNP (last 3 results) No results for input(s): PROBNP in the last 8760 hours. HbA1C: No results for input(s): HGBA1C in the last 72 hours. CBG: No results for input(s): GLUCAP in the last 168 hours. Lipid Profile: No results for input(s): CHOL, HDL, LDLCALC, TRIG, CHOLHDL, LDLDIRECT in the last 72 hours. Thyroid Function Tests: No results for input(s): TSH, T4TOTAL, FREET4, T3FREE, THYROIDAB in the last 72 hours. Anemia Panel: No results for input(s): VITAMINB12, FOLATE, FERRITIN, TIBC, IRON, RETICCTPCT in the last 72 hours.    Radiology Studies: I have reviewed all of the imaging during this hospital visit personally     Scheduled Meds:  . ferrous sulfate  325 mg Oral TID WC  . levETIRAcetam  1,500 mg Oral BID  . pantoprazole (PROTONIX) IV  40 mg Intravenous Q12H  . polyethylene glycol  17 g Oral BID  . sodium chloride flush  3 mL Intravenous Q12H   Continuous Infusions:   LOS: 2 days        [name] Gerome Apley, MD

## 2018-07-24 NOTE — Progress Notes (Signed)
Pill cam given at 833, Pt RN made aware of instructions, went over instructions with patient.

## 2018-07-24 NOTE — Progress Notes (Signed)
Here to place leads and pill camera. Pt needs frequent repetition of instruction. Pt has been NPO - last had sips with Tylenol at 0511 (see MAR).

## 2018-07-24 NOTE — Progress Notes (Signed)
Subjective: The patient was seen and examined at bedside. Reports no bowel movements today, had 1 bowel movement yesterday evening, described as brown in color without any blood in it and not black. Video capsule endoscopy study started today morning, results should be available tomorrow.  Objective: Vital signs in last 24 hours: Temp:  [98.1 F (36.7 C)-100.5 F (38.1 C)] 99.7 F (37.6 C) (05/04 0830) Pulse Rate:  [84-101] 90 (05/04 0830) Resp:  [17-18] 18 (05/04 0830) BP: (106-127)/(59-89) 110/68 (05/04 0830) SpO2:  [96 %-100 %] 97 % (05/04 0830) Weight:  [61.4 kg] 61.4 kg (05/04 0832) Weight change:  Last BM Date: 07/23/18  MW:UXLK pallor  GENERAL:NAD ABDOMEN:not distended, non tender  EXTREMITIES:no deformity  Lab Results: Results for orders placed or performed during the hospital encounter of 07/21/18 (from the past 48 hour(s))  SARS Coronavirus 2 (CEPHEID - Performed in [facility] hospital lab), Hosp Order     Status: None   Collection Time: 07/22/18 12:56 PM  Result Value Ref Range   SARS Coronavirus 2 NEGATIVE NEGATIVE    Comment: (NOTE) If result is NEGATIVE SARS-CoV-2 target nucleic acids are NOT DETECTED. The SARS-CoV-2 RNA is generally detectable in upper and lower  respiratory specimens during the acute phase of infection. The lowest  concentration of SARS-CoV-2 viral copies this assay can detect is 250  copies / mL. A negative result does not preclude SARS-CoV-2 infection  and should not be used as the sole basis for treatment or other  patient management decisions.  A negative result may occur with  improper specimen collection / handling, submission of specimen other  than nasopharyngeal swab, presence of viral mutation(s) within the  areas targeted by this assay, and inadequate number of viral copies  (<250 copies / mL). A negative result must be combined with clinical  observations, patient history, and epidemiological information. If result is  POSITIVE SARS-CoV-2 target nucleic acids are DETECTED. The SARS-CoV-2 RNA is generally detectable in upper and lower  respiratory specimens dur ing the acute phase of infection.  Positive  results are indicative of active infection with SARS-CoV-2.  Clinical  correlation with patient history and other diagnostic information is  necessary to determine patient infection status.  Positive results do  not rule out bacterial infection or co-infection with other viruses. If result is PRESUMPTIVE POSTIVE SARS-CoV-2 nucleic acids MAY BE PRESENT.   A presumptive positive result was obtained on the submitted specimen  and confirmed on repeat testing.  While 2019 novel coronavirus  (SARS-CoV-2) nucleic acids may be present in the submitted sample  additional confirmatory testing may be necessary for epidemiological  and / or clinical management purposes  to differentiate between  SARS-CoV-2 and other Sarbecovirus currently known to infect humans.  If clinically indicated additional testing with an alternate test  methodology [phone]) is advised. The SARS-CoV-2 RNA is generally  detectable in upper and lower respiratory sp ecimens during the acute  phase of infection. The expected result is Negative. Fact Sheet for Patients:  StrictlyIdeas.no Fact Sheet for Healthcare Providers: BankingDealers.co.za This test is not yet approved or cleared by the [place] FDA and has been authorized for detection and/or diagnosis of SARS-CoV-2 by FDA under an Emergency Use Authorization (EUA).  This EUA will remain in effect (meaning this test can be used) for the duration of the COVID-19 declaration under Section 564(b)(1) of the Act, 21 U.S.C. section 360bbb-3(b)(1), unless the authorization is terminated or revoked sooner. Performed at [facility], [place]  [address], [place], [place] [address]   MRSA PCR Screening     Status: None   Collection Time:  07/22/18  1:07 PM  Result Value Ref Range   MRSA by PCR NEGATIVE NEGATIVE    Comment:        The GeneXpert MRSA Assay (FDA approved for NASAL specimens only), is one component of a comprehensive MRSA colonization surveillance program. It is not intended to diagnose MRSA infection nor to guide or monitor treatment for MRSA infections. Performed at [facility], [place] [address], [place], [place] [address]   Hemoglobin and hematocrit, blood     Status: Abnormal   Collection Time: 07/22/18  5:09 PM  Result Value Ref Range   Hemoglobin 7.7 (L) 13.0 - 17.0 g/dL   HCT 24.5 (L) 39.0 - 52.0 %    Comment: Performed at [facility], [place] [address], [place], [place] [address]  CBC     Status: Abnormal   Collection Time: 07/23/18  6:03 AM  Result Value Ref Range   WBC 6.3 4.0 - 10.5 K/uL   RBC 3.40 (L) 4.22 - 5.81 MIL/uL   Hemoglobin 8.7 (L) 13.0 - 17.0 g/dL   HCT 27.1 (L) 39.0 - 52.0 %   MCV 79.7 (L) 80.0 - 100.0 fL   MCH 25.6 (L) 26.0 - 34.0 pg   MCHC 32.1 30.0 - 36.0 g/dL   RDW 18.2 (H) 11.5 - 15.5 %   Platelets 172 150 - 400 K/uL   nRBC 0.3 (H) 0.0 - 0.2 %    Comment: Performed at [facility], [place] [address], [place], [place] [address]  Protime-INR     Status: Abnormal   Collection Time: 07/23/18  6:03 AM  Result Value Ref Range   Prothrombin Time 17.0 (H) 11.4 - 15.2 seconds   INR 1.4 (H) 0.8 - 1.2    Comment: (NOTE) INR goal varies based on device and disease states. Performed at [facility], [place] [address] [place], [place] [address]   Basic metabolic panel     Status: Abnormal   Collection Time: 07/23/18  6:03 AM  Result Value Ref Range   Sodium 139 135 - 145 mmol/L   Potassium 3.7 3.5 - 5.1 mmol/L   Chloride 105 98 - 111 mmol/L   CO2 27 22 - 32 mmol/L   Glucose, Bld 86 70 - 99 mg/dL   BUN 7 (L) 8 - 23 mg/dL   Creatinine, Ser 0.66 0.61 - 1.24 mg/dL   Calcium 8.0 (L) 8.9 - 10.3 mg/dL   GFR calc non Af Amer >60 >60 mL/min   GFR calc Af Amer  >60 >60 mL/min   Anion gap 7 5 - 15    Comment: Performed at [facility], [place] [address], [place],  [address]  CBC with Differential/Platelet     Status: Abnormal   Collection Time: 07/24/18  3:42 AM  Result Value Ref Range   WBC 9.2 4.0 - 10.5 K/uL   RBC 3.39 (L) 4.22 - 5.81 MIL/uL   Hemoglobin 8.7 (L) 13.0 - 17.0 g/dL   HCT 27.2 (L) 39.0 - 52.0 %   MCV 80.2 80.0 - 100.0 fL   MCH 25.7 (L) 26.0 - 34.0 pg   MCHC 32.0 30.0 - 36.0 g/dL   RDW 18.6 (H) 11.5 - 15.5 %   Platelets 178 150 - 400 K/uL   nRBC 0.2 0.0 - 0.2 %   Neutrophils Relative % 70 %  Neutro Abs 6.5 1.7 - 7.7 K/uL   Lymphocytes Relative 9 %   Lymphs Abs 0.8 0.7 - 4.0 K/uL   Monocytes Relative 18 %   Monocytes Absolute 1.6 (H) 0.1 - 1.0 K/uL   Eosinophils Relative 1 %   Eosinophils Absolute 0.1 0.0 - 0.5 K/uL   Basophils Relative 1 %   Basophils Absolute 0.1 0.0 - 0.1 K/uL   Immature Granulocytes 1 %   Abs Immature Granulocytes 0.06 0.00 - 0.07 K/uL    Comment: Performed at [place] [address], [place], [place] [address]  Basic metabolic panel     Status: Abnormal   Collection Time: 07/24/18  3:42 AM  Result Value Ref Range   Sodium 138 135 - 145 mmol/L   Potassium 3.5 3.5 - 5.1 mmol/L   Chloride 105 98 - 111 mmol/L   CO2 26 22 - 32 mmol/L   Glucose, Bld 91 70 - 99 mg/dL   BUN 6 (L) 8 - 23 mg/dL   Creatinine, Ser 0.73 0.61 - 1.24 mg/dL   Calcium 7.9 (L) 8.9 - 10.3 mg/dL   GFR calc non Af Amer >60 >60 mL/min   GFR calc Af Amer >60 >60 mL/min   Anion gap 7 5 - 15    Comment: Performed at [facility], [place] [address], [place], [place] [address]    Studies/Results: No results found.  Medications: I have reviewed the patient's current medications.  Assessment: Anemia, hemoglobin stable at 8.7/8.7 in the last 24 hours, normal BUN/creatinine ratio, FOBT positive stools 2 units PRBC transfused on 07/22/2018 2 units PRBC transfused on 07/21/2018 2 units FFP transfused on  07/21/2018  EGD performed on 07/23/2018 showed small bowel and gastric AVMs status post APC CT from 07/21/2018 showed extensive stool in colon, slight gaseous distention of sigmoid colon which is elongated and extends up to the diaphragm, chronic calcified granulomas in liver and spleen and upper abdominal lymph nodes  Plan: Capsule endoscopy being done today, results should be available tomorrow Remains hemodynamically stable   Ronnette Juniper 07/24/2018, 11:24 AM   Pager [phone] If no answer or after 5 PM call [phone]

## 2018-07-25 ENCOUNTER — Telehealth: Payer: Self-pay

## 2018-07-25 ENCOUNTER — Encounter (HOSPITAL_COMMUNITY): Payer: Self-pay | Admitting: Gastroenterology

## 2018-07-25 LAB — CBC WITH DIFFERENTIAL/PLATELET
Abs Immature Granulocytes: 0.04 10*3/uL (ref 0.00–0.07)
Basophils Absolute: 0.1 10*3/uL (ref 0.0–0.1)
Basophils Relative: 1 %
Eosinophils Absolute: 0.1 10*3/uL (ref 0.0–0.5)
Eosinophils Relative: 2 %
HCT: 26.9 % — ABNORMAL LOW (ref 39.0–52.0)
Hemoglobin: 8.5 g/dL — ABNORMAL LOW (ref 13.0–17.0)
Immature Granulocytes: 1 %
Lymphocytes Relative: 13 %
Lymphs Abs: 0.8 10*3/uL (ref 0.7–4.0)
MCH: 25.7 pg — ABNORMAL LOW (ref 26.0–34.0)
MCHC: 31.6 g/dL (ref 30.0–36.0)
MCV: 81.3 fL (ref 80.0–100.0)
Monocytes Absolute: 1.3 10*3/uL — ABNORMAL HIGH (ref 0.1–1.0)
Monocytes Relative: 19 %
Neutro Abs: 4.3 10*3/uL (ref 1.7–7.7)
Neutrophils Relative %: 64 %
Platelets: 172 10*3/uL (ref 150–400)
RBC: 3.31 MIL/uL — ABNORMAL LOW (ref 4.22–5.81)
RDW: 19.4 % — ABNORMAL HIGH (ref 11.5–15.5)
WBC: 6.5 10*3/uL (ref 4.0–10.5)
nRBC: 0.3 % — ABNORMAL HIGH (ref 0.0–0.2)

## 2018-07-25 MED ORDER — APIXABAN 5 MG PO TABS: 5.0000 mg | ORAL_TABLET | Freq: Two times a day (BID) | ORAL | 0 refills | Status: DC

## 2018-07-25 MED ORDER — APIXABAN 5 MG PO TABS
5.0000 mg | ORAL_TABLET | Freq: Two times a day (BID) | ORAL | Status: DC
  Administered 2018-07-25: 5 mg via ORAL
  Filled 2018-07-25: qty 1

## 2018-07-25 MED ORDER — PANTOPRAZOLE SODIUM 40 MG PO TBEC: 40.0000 mg | DELAYED_RELEASE_TABLET | Freq: Every day | ORAL | Status: DC

## 2018-07-25 MED ORDER — PANTOPRAZOLE SODIUM 40 MG PO TBEC: 40.0000 mg | DELAYED_RELEASE_TABLET | Freq: Every day | ORAL | 0 refills | Status: DC

## 2018-07-25 NOTE — Discharge Summary (Addendum)
Physician Discharge Summary  [name] FHL:[id number] DOB: Jun 20, 1940 DOA: 07/21/2018  PCP: [name], MD  Admit date: 07/21/2018 Discharge date: 07/25/2018  Admitted From: Home Disposition:  Home   Recommendations for Outpatient Follow-up and new medication changes:  1. Follow up with Dr. Felipa Eth in 7 days.  2. Patient will need frequent Hgb and Hct, serum iron panel monitoring.  3. Warfarin has been discontinued and patient has been placed on apixaban to prevent over anticoagulation.  4. Continue proton pump inhibitor with pantoprazole.  5. Patient required intermittent IV iron infusions. 6. Patient tested negative for COVID 19.   Home Health: Yes   Equipment/Devices: no    Discharge Condition: stable  CODE STATUS: full  Diet recommendation: heart healthy diet.   Brief/Interim Summary: 78 year old male who presented with abdominal pain. He does have significant past medical history for hypertension, atrial flutter, history of CVA, history of seizures and gastrointestinal bleeding. Reported 2-week history of abdominal pain,associated with nausea but no chest pain or dyspnea. He was noted to have dark stools for the last few days per his caregiver. On hid initial physical examination his blood pressure was 113/52, heart rate 91, respiratory rate 18, temperature 98.6, oxygen saturation 93%, his lungs were clear to auscultation bilaterally, heart S1-S2 present and rhythmic, the abdomen was soft, no lower extremity edema. Sodium 139, potassium 3.9, chloride 107, bicarb 25, glucose 103, BUN 14, creatinine 0.76, white count 4.9, hemoglobin 14.7, hematocrit 16.2, platelet 197,INR 4.0.  CT of the abdomen showed extensive stool in the colon.  EKG 92 bpm, normal axis, right bundle branch block, no significant ST segment or T wave changes.  Patient was admitted to the hospital for working diagnosis of symptomatic anemia due to acute blood loss due to  upper GI bleed, in the  setting of vitamin K coagulopathy.   1.  Acute blood loss anemia, due to upper GI bleed, stomach and duodenum AVMs.  Patient was admitted to the medical ward, he was placed on a remote telemetry monitor, he received 2 units packed red blood cells and his coagulopathy was reverted with FFP and vitamin K.  Patient received IV pantoprazole.  Further work-up with upper endoscopy showed AVMs in the stomach and D2, (treated with APC), capsule endoscopy with multiple duodenal AVMs and multiple nonbleeding xanthomas throughout small bowel.  Hemoglobin has been 8.7-8.5, hematocrit 27-26.9, over the last 3 days. INR at discharge 1.4.  No further signs of bleeding.  Patient will need frequent monitoring of hemoglobin, hematocrit and iron panel, he may need intermittent IV iron infusions and possible PRBC transfusions.  2.  Paroxysmal atrial flutter.  Patient remained sinus rhythm on the telemetry monitor, his anticoagulation was held due to gastrointestinal bleeding.  At discharge he will resume anticoagulation with apixaban to prevent risk of over coagulation with warfarin.  3.  Acute urinary retention.  Patient was noted to have abdominal pain and urinary retention, required Foley catheter.  Patient will have a voiding trial before discharge.  4.  Seizures.  Patient will continue Keppra and Dilantin.  No active seizures.  5.  Iron deficiency anemia.  Continue iron supplements, check iron panel as an outpatient.  6.  History of CVA, continue anticoagulation with apixaban, patient was noted to be weak and deconditioned, home therapy will be arranged.  Discharge Diagnoses:  Principal Problem:   Gastrointestinal bleeding Active Problems:   Seizure disorder (HCC)   History of cardiovascular disorder   Dementia (AD)   Supratherapeutic INR  GIB (gastrointestinal bleeding)    Discharge Instructions   Allergies as of 07/25/2018   No Known Allergies     Medication List    STOP taking these medications    warfarin 2.5 MG tablet Commonly known as:  COUMADIN     TAKE these medications   apixaban 5 MG Tabs tablet Commonly known as:  ELIQUIS Take 1 tablet (5 mg total) by mouth 2 (two) times daily for 30 days.   ferrous sulfate 325 (65 FE) MG tablet Take 1 tablet (325 mg total) by mouth 3 (three) times daily with meals.   levETIRAcetam 500 MG tablet Commonly known as:  KEPPRA TAKE 3 TABLETS BY MOUTH TWICE A DAY What changed:    how much to take  how to take this  when to take this   pantoprazole 40 MG tablet Commonly known as:  PROTONIX Take 1 tablet (40 mg total) by mouth daily for 30 days. Start taking on:  Jul 26, 2018   phenytoin 100 MG ER capsule Commonly known as:  DILANTIN TAKE 3 CAPSULES BY MOUTH DAILY   predniSONE 10 MG tablet Commonly known as:  DELTASONE Take 10-15 mg by mouth See admin instructions. Taking 15 mg (1& 1/2 tablet) on Mon, Wed, Friday, all other days 1 tablet (10mg ) daily.       No Known Allergies  Consultations:  GI    Procedures/Studies: Ct Abdomen Pelvis W Contrast  Result Date: 07/21/2018 CLINICAL DATA:  Abdominal pain and nausea. EXAM: CT ABDOMEN AND PELVIS WITH CONTRAST TECHNIQUE: Multidetector CT imaging of the abdomen and pelvis was performed using the standard protocol following bolus administration of intravenous contrast. CONTRAST:  13mL ISOVUE-300 IOPAMIDOL (ISOVUE-300) INJECTION 61% COMPARISON:  CT scan dated 12/10/2016 FINDINGS: Lower chest: Minimal atelectasis at the lung bases posteriorly. Heart size is normal. Minimal pericardial fluid. Hepatobiliary: Extensive small calcified granulomas throughout the liver and spleen. 12 mm enhancing lesion in the right lobe of the liver consistent with a benign hemangioma. Liver parenchyma is otherwise normal. Biliary tree is normal. Pancreas: Unremarkable. No pancreatic ductal dilatation or surrounding inflammatory changes. Spleen: Numerous calcified granulomas in the otherwise normal spleen.  Adrenals/Urinary Tract: Normal adrenal glands. Normal appearing kidneys. No hydronephrosis. Bladder is normal. Stomach/Bowel: Stomach is within normal limits. Appendix appears normal. There is extensive stool throughout the colon including a moderate amount of stool in the rectum. The sigmoid portion of the colon is elongated and there is gaseous distention of the sigmoid colon. This is best appreciated on the coronal images. No evidence of bowel wall thickening, distention, or inflammatory changes. There are a few calcifications in the soft tissues adjacent to the hilum of the liver and adjacent to the head of the pancreas consistent calcified lymph nodes. Vascular/Lymphatic: Slight aortic atherosclerosis.  No adenopathy. Reproductive: Prostate is unremarkable. Other: No abdominal wall hernia or abnormality. No abdominopelvic ascites. Musculoskeletal: No acute abnormality. Diffuse degenerative changes in the lumbar spine. Moderate arthritis of both hips. Old healed proximal left femur fracture. IMPRESSION: 1. Extensive stool in the colon. Slight gaseous distention of the sigmoid colon which is elongated and extends almost to the level of the diaphragm. 2. No acute abnormalities. 3.  Aortic Atherosclerosis (ICD10-I70.0). 4. Chronic calcified granulomas in the liver and spleen and upper abdominal lymph nodes consistent with previous granulomatous disease. Electronically Signed   By: Lorriane Shire [name]   On: 07/21/2018 12:49      Procedures: EGD  Capsule endoscopy.   Subjective: Patient is feeling better, no nausea or vomiting, tolerating po well. No chest pain or dyspnea.   Discharge Exam: Vitals:   07/24/18 2104 07/25/18 0529  BP: 102/61 105/62  Pulse: 77 68  Resp: 18 18  Temp: 98.8 F (37.1 C) 98 F (36.7 C)  SpO2: 99% 99%   Vitals:   07/24/18 0832 07/24/18 1331 07/24/18 2104 07/25/18 0529  BP:  101/63 102/61 105/62  Pulse:  70 77 68  Resp:  18 18 18   Temp:  98.4 F  (36.9 C) 98.8 F (37.1 C) 98 F (36.7 C)  TempSrc:  Oral Oral   SpO2:  90% 99% 99%  Weight: 61.4 kg     Height: 6' (1.829 m)       General: Not in pain or dyspnea Neurology: Awake and alert, non focal  E ENT: mild pallor, no icterus, oral mucosa moist Cardiovascular: No JVD. S1-S2 present, rhythmic, no gallops, rubs, or murmurs. No lower extremity edema. Pulmonary: positive breath sounds bilaterally, adequate air movement, no wheezing, rhonchi or rales. Gastrointestinal. Abdomen with no organomegaly, non tender, no rebound or guarding Skin. No rashes Musculoskeletal: no joint deformities   The results of significant diagnostics from this hospitalization (including imaging, microbiology, ancillary and laboratory) are listed below for reference.     Microbiology: Recent Results (from the past 240 hour(s))  SARS Coronavirus 2 (CEPHEID - Performed in [facility] hospital lab), Hosp Order     Status: None   Collection Time: 07/22/18 12:56 PM  Result Value Ref Range Status   SARS Coronavirus 2 NEGATIVE NEGATIVE Final    Comment: (NOTE) If result is NEGATIVE SARS-CoV-2 target nucleic acids are NOT DETECTED. The SARS-CoV-2 RNA is generally detectable in upper and lower  respiratory specimens during the acute phase of infection. The lowest  concentration of SARS-CoV-2 viral copies this assay can detect is 250  copies / mL. A negative result does not preclude SARS-CoV-2 infection  and should not be used as the sole basis for treatment or other  patient management decisions.  A negative result may occur with  improper specimen collection / handling, submission of specimen other  than nasopharyngeal swab, presence of viral mutation(s) within the  areas targeted by this assay, and inadequate number of viral copies  (<250 copies / mL). A negative result must be combined with clinical  observations, patient history, and epidemiological information. If result is POSITIVE SARS-CoV-2 target  nucleic acids are DETECTED. The SARS-CoV-2 RNA is generally detectable in upper and lower  respiratory specimens dur ing the acute phase of infection.  Positive  results are indicative of active infection with SARS-CoV-2.  Clinical  correlation with patient history and other diagnostic information is  necessary to determine patient infection status.  Positive results do  not rule out bacterial infection or co-infection with other viruses. If result is PRESUMPTIVE POSTIVE SARS-CoV-2 nucleic acids MAY BE PRESENT.   A presumptive positive result was obtained on the submitted specimen  and confirmed on repeat testing.  While 2019 novel coronavirus  (SARS-CoV-2) nucleic acids may be present in the submitted sample  additional confirmatory testing may be necessary for epidemiological  and / or clinical management purposes  to differentiate between  SARS-CoV-2 and other Sarbecovirus currently known to infect humans.  If clinically indicated additional testing with an alternate test  methodology [phone]) is advised. The SARS-CoV-2 RNA is generally  detectable in upper and lower respiratory sp ecimens during the acute  phase  of infection. The expected result is Negative. Fact Sheet for Patients:  StrictlyIdeas.no Fact Sheet for Healthcare Providers: BankingDealers.co.za This test is not yet approved or cleared by the [place] FDA and has been authorized for detection and/or diagnosis of SARS-CoV-2 by FDA under an Emergency Use Authorization (EUA).  This EUA will remain in effect (meaning this test can be used) for the duration of the COVID-19 declaration under Section 564(b)(1) of the Act, 21 U.S.C. section 360bbb-3(b)(1), unless the authorization is terminated or revoked sooner. Performed at [facility], [place] [address] [place], [place] [address]   MRSA PCR Screening     Status: None   Collection Time: 07/22/18  1:07 PM  Result  Value Ref Range Status   MRSA by PCR NEGATIVE NEGATIVE Final    Comment:        The GeneXpert MRSA Assay (FDA approved for NASAL specimens only), is one component of a comprehensive MRSA colonization surveillance program. It is not intended to diagnose MRSA infection nor to guide or monitor treatment for MRSA infections. Performed at [facility], [place] [address], [place], [place] [address]      Labs: BNP (last 3 results) No results for input(s): BNP in the last 8760 hours. Basic Metabolic Panel: Recent Labs  Lab 07/21/18 0957 07/22/18 0307 07/23/18 0603 07/24/18 0342  NA 139 138 139 138  K 3.9 4.0 3.7 3.5  CL 107 105 105 105  CO2 25 27 27 26   GLUCOSE 103* 81 86 91  BUN 14 9 7* 6*  CREATININE 0.76 0.68 0.66 0.73  CALCIUM 8.4* 7.8* 8.0* 7.9*   Liver Function Tests: Recent Labs  Lab 07/21/18 0957  AST 18  ALT 12  ALKPHOS 77  BILITOT 0.2*  PROT 5.9*  ALBUMIN 3.6   Recent Labs  Lab 07/21/18 0957  LIPASE 26   No results for input(s): AMMONIA in the last 168 hours. CBC: Recent Labs  Lab 07/21/18 0957 07/22/18 0307 07/22/18 0958 07/22/18 1709 07/23/18 0603 07/24/18 0342 07/25/18 0408  WBC 4.9 5.9  --   --  6.3 9.2 6.5  NEUTROABS  --   --   --   --   --  6.5 4.3  HGB 4.7* 6.5* 6.6* 7.7* 8.7* 8.7* 8.5*  HCT 16.2* 20.8* 21.1* 24.5* 27.1* 27.2* 26.9*  MCV 79.4* 79.1*  --   --  79.7* 80.2 81.3  PLT 197 188  --   --  172 178 172   Cardiac Enzymes: No results for input(s): CKTOTAL, CKMB, CKMBINDEX, TROPONINI in the last 168 hours. BNP: Invalid input(s): POCBNP CBG: No results for input(s): GLUCAP in the last 168 hours. D-Dimer No results for input(s): DDIMER in the last 72 hours. Hgb A1c No results for input(s): HGBA1C in the last 72 hours. Lipid Profile No results for input(s): CHOL, HDL, LDLCALC, TRIG, CHOLHDL, LDLDIRECT in the last 72 hours. Thyroid function studies No results for input(s): TSH, T4TOTAL, T3FREE, THYROIDAB in the last 72  hours.  Invalid input(s): FREET3 Anemia work up No results for input(s): VITAMINB12, FOLATE, FERRITIN, TIBC, IRON, RETICCTPCT in the last 72 hours. Urinalysis    Component Value Date/Time   COLORURINE YELLOW 12/01/2015 1300   APPEARANCEUR CLEAR 12/01/2015 1300   LABSPEC 1.017 12/01/2015 1300   PHURINE 5.5 12/01/2015 1300   GLUCOSEU NEGATIVE 12/01/2015 1300   HGBUR NEGATIVE 12/01/2015 1300   BILIRUBINUR NEGATIVE 12/01/2015 1300   KETONESUR NEGATIVE 12/01/2015 1300   PROTEINUR NEGATIVE 12/01/2015 1300  UROBILINOGEN 0.2 06/02/2011 0300   NITRITE NEGATIVE 12/01/2015 1300   LEUKOCYTESUR NEGATIVE 12/01/2015 1300   Sepsis Labs Invalid input(s): PROCALCITONIN,  WBC,  LACTICIDVEN Microbiology Recent Results (from the past 240 hour(s))  SARS Coronavirus 2 (CEPHEID - Performed in [place] hospital lab), Hosp Order     Status: None   Collection Time: 07/22/18 12:56 PM  Result Value Ref Range Status   SARS Coronavirus 2 NEGATIVE NEGATIVE Final    Comment: (NOTE) If result is NEGATIVE SARS-CoV-2 target nucleic acids are NOT DETECTED. The SARS-CoV-2 RNA is generally detectable in upper and lower  respiratory specimens during the acute phase of infection. The lowest  concentration of SARS-CoV-2 viral copies this assay can detect is 250  copies / mL. A negative result does not preclude SARS-CoV-2 infection  and should not be used as the sole basis for treatment or other  patient management decisions.  A negative result may occur with  improper specimen collection / handling, submission of specimen other  than nasopharyngeal swab, presence of viral mutation(s) within the  areas targeted by this assay, and inadequate number of viral copies  (<250 copies / mL). A negative result must be combined with clinical  observations, patient history, and epidemiological information. If result is POSITIVE SARS-CoV-2 target nucleic acids are DETECTED. The SARS-CoV-2 RNA is generally detectable in  upper and lower  respiratory specimens dur ing the acute phase of infection.  Positive  results are indicative of active infection with SARS-CoV-2.  Clinical  correlation with patient history and other diagnostic information is  necessary to determine patient infection status.  Positive results do  not rule out bacterial infection or co-infection with other viruses. If result is PRESUMPTIVE POSTIVE SARS-CoV-2 nucleic acids MAY BE PRESENT.   A presumptive positive result was obtained on the submitted specimen  and confirmed on repeat testing.  While 2019 novel coronavirus  (SARS-CoV-2) nucleic acids may be present in the submitted sample  additional confirmatory testing may be necessary for epidemiological  and / or clinical management purposes  to differentiate between  SARS-CoV-2 and other Sarbecovirus currently known to infect humans.  If clinically indicated additional testing with an alternate test  methodology [phone]) is advised. The SARS-CoV-2 RNA is generally  detectable in upper and lower respiratory sp ecimens during the acute  phase of infection. The expected result is Negative. Fact Sheet for Patients:  StrictlyIdeas.no Fact Sheet for Healthcare Providers: BankingDealers.co.za This test is not yet approved or cleared by the [place] FDA and has been authorized for detection and/or diagnosis of SARS-CoV-2 by FDA under an Emergency Use Authorization (EUA).  This EUA will remain in effect (meaning this test can be used) for the duration of the COVID-19 declaration under Section 564(b)(1) of the Act, 21 U.S.C. section 360bbb-3(b)(1), unless the authorization is terminated or revoked sooner. Performed at [facility], [place] [address], [place], [place] [address]   MRSA PCR Screening     Status: None   Collection Time: 07/22/18  1:07 PM  Result Value Ref Range Status   MRSA by PCR NEGATIVE NEGATIVE Final    Comment:         The GeneXpert MRSA Assay (FDA approved for NASAL specimens only), is one component of a comprehensive MRSA colonization surveillance program. It is not intended to diagnose MRSA infection nor to guide or monitor treatment for MRSA infections. Performed at [facility], [place] [address], [place], [place] [address]  Time coordinating discharge: 45 minutes  SIGNED:   Tawni Millers, MD  [facility] 07/25/2018, 11:56 AM

## 2018-07-25 NOTE — TOC Initial Note (Addendum)
Transition of Care [facility]) - Initial/Assessment Note    Patient Details  Name: [name] MRN: [id number] Date of Birth: [birth year]  [facility] CM/SW Contact:    Benard Halsted, LCSW Phone Number: 07/25/2018, 4:39 PM  Clinical Narrative:                 CSW received consult for possible SNF placement at time of discharge. CSW spoke with patient regarding PT recommendation of SNF placement at time of discharge. Patient reported that he is going home and refusing SNF placement. He reports he stays at home part of the time alone and his daughter assists him the rest of the time. He is also declining home health services or equipment. No further questions reported at this time. He reports that he is packing his stuff up to go home but thanked CSW.   CSW confirmed plan with patient's daughter. She reports that patient has a caregiver at home and a walker at home and that he will have supervision 24/7. Daughter will pick patient up at discharge. CSW signing off.    Expected Discharge Plan: Home/Self Care Barriers to Discharge: No Barriers Identified   Patient Goals and CMS Choice Patient states their goals for this hospitalization and ongoing recovery are:: Return home   Choice offered to / list presented to : Patient  Expected Discharge Plan and Services Expected Discharge Plan: Home/Self Care In-house Referral: Clinical Social Work Discharge Planning Services: NA Post Acute Care Choice: NA Living arrangements for the past 2 months: Single Family Home Expected Discharge Date: 07/25/18               DME Arranged: N/A DME Agency: NA       HH Arranged: NA, Refused SNF [place] Agency: NA        Prior Living Arrangements/Services Living arrangements for the past 2 months: Single Family Home Lives with:: Self, Adult Children Patient language and need for interpreter reviewed:: Yes Do you feel safe going back to the place where you live?: Yes      Need for Family  Participation in Patient Care: No (Comment) Care giver support system in place?: Yes (comment)   Criminal Activity/Legal Involvement Pertinent to Current Situation/Hospitalization: No - Comment as needed  Activities of Daily Living Home Assistive Devices/Equipment: None ADL Screening (condition at time of admission) Patient's cognitive ability adequate to safely complete daily activities?: Yes Is the patient deaf or have difficulty hearing?: No Does the patient have difficulty seeing, even when wearing glasses/contacts?: No Does the patient have difficulty concentrating, remembering, or making decisions?: No Patient able to express need for assistance with ADLs?: Yes Does the patient have difficulty dressing or bathing?: No Independently performs ADLs?: Yes (appropriate for developmental age) Does the patient have difficulty walking or climbing stairs?: Yes Weakness of Legs: Both Weakness of Arms/Hands: Both  Permission Sought/Granted                  Emotional Assessment Appearance:: Appears stated age Attitude/Demeanor/Rapport: Hostile Affect (typically observed): Appropriate, Accepting Orientation: : Oriented to Self, Oriented to Place, Oriented to  Time, Oriented to Situation Alcohol / Substance Use: Not Applicable Psych Involvement: No (comment)  Admission diagnosis:  Supratherapeutic INR [R79.1] Gastrointestinal hemorrhage with melena [K92.1] Patient Active Problem List   Diagnosis Date Noted  . GIB (gastrointestinal bleeding) 07/22/2018  . Gastrointestinal bleeding 07/21/2018  . Supratherapeutic INR 07/21/2018  . Pressure injury of skin 12/13/2016  . Protein-calorie malnutrition, severe 12/09/2016  .  Fall   . Closed fracture of femur, intertrochanteric, left, initial encounter ([place]) 12/06/2016  . Aspiration pneumonia ([place]) 12/01/2015  . Erosive gastropathy: Per EGD 11/30/2015 11/30/2015  . Iron deficiency anemia due to chronic blood loss 11/28/2015  .  Localization-related symptomatic epilepsy and epileptic syndromes with complex partial seizures, not intractable, without status epilepticus ([place]) 07/07/2015  . Encounter for therapeutic drug monitoring 08/10/2013  . Dementia (AD) 11/16/2012  . Long term (current) use of anticoagulants 06/30/2011  . Atrial flutter ([place]) 06/04/2011  . 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 SINUSITIS, CHRONIC NEC 12/14/2006  . ABUSE, ALCOHOL, CONTINUOUS 10/11/2006  . History of cardiovascular disorder 10/11/2006  . Essential hypertension 09/15/2006  . CVA 09/15/2006  . ALLERGIC RHINITIS 09/15/2006  . CIRRHOSIS, ALCOHOLIC, LIVER 61/68/3729  . Seizure disorder ([place]) 09/15/2006  . PANCREATITIS, HX OF 09/15/2006   PCP:  [name], MD Pharmacy:   [id number] - [place], [place] [address] [address] Phone: [phone] Fax: [phone]  [id number] - [place], [employer] [place] - [address] [place] [address] Phone: [phone] Fax: [phone]  [facility] [place], [place] - [address] [place] [address] Phone: [phone] Fax: [phone]     Social Determinants of Health (SDOH) Interventions    Readmission Risk Interventions Readmission Risk Prevention Plan 07/25/2018  Transportation Screening Complete  PCP or Specialist Appt within 5-7 Days Complete  Home Care Screening Complete  Medication Review (RN CM) Complete  Some recent data might be hidden

## 2018-07-25 NOTE — Progress Notes (Signed)
Frances Nickels Housh to be D/C'd home per MD order. Discussed with the patient and all questions fully answered. Daughter, [name], called and went over AVS.VVS, Skin clean, dry and intact without evidence of skin break down, no evidence of skin tears noted.  IV catheter discontinued intact. Site without signs and symptoms of complications. Dressing and pressure applied.  An After Visit Summary was printed and given to the patient.  Patient escorted via [place], and D/C home via private auto.  Melonie Florida  07/25/2018 6:12 PM

## 2018-07-25 NOTE — Evaluation (Signed)
Physical Therapy Evaluation Patient Details Name: [name] MRN: [id number] DOB: [birth year] Today's Date: 07/25/2018   History of Present Illness  78 y.o. male admitted on 07/21/18 for weakness, nausea, and abdominal pain.  Hgb in ED was 4.7 and INR was 4.  Pt was given multiple units of PRBCs and FFP.  Pt underwent EGD on 07/23/18 showing bowel and gastric AVMs s/p APC.  Pt also underwent capsule endoscopy 07/24/18.  Pt with significant PMH of A-flutter, stroke, pancreatitis, HTN, L hip fx (due to fall) s/p I M Nail.    Clinical Impression  [name] is incredibly unsteady on his feet, reporting h/o falls at home.  He refuses to use anything but a cane and was not receptive to SNF rehab or home therapy.  He has what appear to be baseline cognitive deficits impairing his memory and safety.  He is agreeable for me to follow him here while in the hospital to continue walking.   PT to follow acutely for deficits listed below.      Follow Up Recommendations SNF(pt is refusing this recommendation and any home therapy)    Equipment Recommendations  None recommended by PT(as pt refuses to use RW)    Recommendations for Other Services   NA    Precautions / Restrictions Precautions Precautions: Fall Precaution Comments: h/o falls and stroke with R sided deficits      Mobility  Bed Mobility Overal bed mobility: Needs Assistance Bed Mobility: Supine to Sit;Sit to Supine     Supine to sit: Supervision Sit to supine: Supervision   General bed mobility comments: Pt struggled to come to EOB unassisted. Significant extra time needed and heavy reliance on the bed rail to pull to sitting.  Momentum used to get back into bed.    Transfers Overall transfer level: Needs assistance Equipment used: Straight cane Transfers: Sit to/from Stand Sit to Stand: Min assist         General transfer comment: Min assist with multiple attempts and heavy reliance on both hands for transitions to stand,  immediately reaching for support from objects in room for balance and stability.  Lower legs supported by bed. Stand to sit transitions uncontrolled and pt thrusts posteriorly during this transition making me think that he may not have recovered full hip ROM after his fall/hip surgery.   Ambulation/Gait Ambulation/Gait assistance: Min assist Gait Distance (Feet): 45 Feet Assistive device: Straight cane Gait Pattern/deviations: Step-through pattern;Steppage;Decreased dorsiflexion - right;Decreased weight shift to right;Staggering left;Staggering right     General Gait Details: Pt with significant gait instability, steppage pattern on the right which is consistant with PMH of stroke, very wobbly with just use of cane, reaching with free hand for support from furniture.  Pt reports he falls at home, but he "tries not to use the cane if I can" declines wanting to use a RW for added stability.           Balance Overall balance assessment: Needs assistance Sitting-balance support: Feet supported;No upper extremity supported Sitting balance-Leahy Scale: Fair     Standing balance support: Bilateral upper extremity supported Standing balance-Leahy Scale: Poor Standing balance comment: needs external assist for stability when up on his feet.                              Pertinent Vitals/Pain Pain Assessment: No/denies pain    Home Living Family/patient expects to be discharged to:: Private residence Living Arrangements:  Alone Available Help at Discharge: Family;Available PRN/intermittently Type of Home: House       Home Layout: Able to live on main level with bedroom/bathroom Home Equipment: [name] - single point Additional Comments: Pt getting irritated by all of the questions, so limited home set up information.      Prior Function Level of Independence: Needs assistance   Gait / Transfers Assistance Needed: walks with a cane, but "tries not to if I can avoid it" reports  h/o falls and had a fall with leg fx in 2018.     Comments: Reports he does not drive, "someone gets my groceries"     Hand Dominance   Dominant Hand: Right    Extremity/Trunk Assessment   Upper Extremity Assessment Upper Extremity Assessment: Overall WFL for tasks assessed    Lower Extremity Assessment Lower Extremity Assessment: RLE deficits/detail RLE Deficits / Details: right leg with steppage gait pattern, reports he does not wear a brace with significant knee instability and foot drop on this side.      Cervical / Trunk Assessment Cervical / Trunk Assessment: Normal  Communication   Communication: HOH  Cognition Arousal/Alertness: Awake/alert Behavior During Therapy: Impulsive Overall Cognitive Status: Impaired/Different from baseline Area of Impairment: Orientation;Attention;Memory;Following commands;Safety/judgement;Awareness;Problem solving                 Orientation Level: Disoriented to;Time Current Attention Level: Selective Memory: Decreased recall of precautions;Decreased short-term memory Following Commands: Follows one step commands consistently Safety/Judgement: Decreased awareness of safety;Decreased awareness of deficits Awareness: Emergent Problem Solving: Difficulty sequencing;Requires verbal cues;Requires tactile cues General Comments: Pt with decreased awareness of his deficits, could not remember that I stopped by and hour ago.  No family present to report baseline, but appears to have some baseline deficits with PMH of stroke.               Assessment/Plan    PT Assessment Patient needs continued PT services  PT Problem List Decreased strength;Decreased balance;Decreased activity tolerance;Decreased mobility;Decreased coordination;Decreased cognition;Decreased knowledge of use of DME;Decreased safety awareness;Decreased knowledge of precautions       PT Treatment Interventions DME instruction;Gait training;Stair training;Therapeutic  activities;Functional mobility training;Therapeutic exercise;Balance training;Neuromuscular re-education;Cognitive remediation;Patient/family education    PT Goals (Current goals can be found in the Care Plan section)  Acute Rehab PT Goals Patient Stated Goal: to go home soon PT Goal Formulation: With patient Time For Goal Achievement: 08/08/18 Potential to Achieve Goals: Good    Frequency Min 3X/week   Barriers to discharge Decreased caregiver support per pt lives alone       AM-PAC PT "6 Clicks" Mobility  Outcome Measure Help needed turning from your back to your side while in a flat bed without using bedrails?: A Little Help needed moving from lying on your back to sitting on the side of a flat bed without using bedrails?: A Little Help needed moving to and from a bed to a chair (including a wheelchair)?: A Little Help needed standing up from a chair using your arms (e.g., wheelchair or bedside chair)?: A Little Help needed to walk in hospital room?: A Little Help needed climbing 3-5 steps with a railing? : A Lot 6 Click Score: 17    End of Session   Activity Tolerance: Patient tolerated treatment well Patient left: in bed;with call bell/phone within reach;with bed alarm set Nurse Communication: Mobility status PT Visit Diagnosis: Muscle weakness (generalized) (M62.81);Difficulty in walking, not elsewhere classified (R26.2);Unsteadiness on feet (R26.81)    Time: 5465-6812 PT  Time Calculation (min) (ACUTE ONLY): 28 min   Charges:          Wells Guiles B. Antwion Carpenter, PT, DPT  Acute Rehabilitation [phone] pager #[phone] office   PT Evaluation $PT Eval Moderate Complexity: 1 Mod PT Treatments $Gait Training: 8-22 mins       07/25/2018, 12:50 PM

## 2018-07-25 NOTE — Progress Notes (Signed)
Small bowel pillcam study done.  Findings:  Multiple small and diminutive non bleeding AVMs noted(mostly in proximal duodenum, few in stomach). No evidence of active or recent bleeding. Multiple non bleeding xanthomas noted throughout small bowel.  Recommend start anticoagulation as needed(he has atrial fibrillation and history of stroke), perhaps Eliquis may be be better alternative than warfarin.  Periodic CBC,Iron panel, ferritin monitoring and may need intermittently IV iron infusions. Ok to resume regular diet and D/C from Gi standpoint as patient has had stable Hb for the past 3 days.  Discussed the same with patient's hospitalist Dr.Arrien.  Ronnette Juniper, MD

## 2018-07-25 NOTE — Progress Notes (Signed)
PT Cancellation Note  Patient Details Name: Jacquez Sheetz MRN: [id number] DOB: 1940/08/04   Cancelled Treatment:    Reason Eval/Treat Not Completed: Patient declined, no reason specified.  Pt refused, but agreeable for PT to check back later today.  Thanks,  Barbarann Ehlers. Jasim Harari, PT, DPT  Acute Rehabilitation [phone] pager [phone]) [phone] office     Wells Guiles B Geovany Trudo 07/25/2018, 10:39 AM

## 2018-07-25 NOTE — Telephone Encounter (Signed)
lmom for prescreen  

## 2018-07-25 NOTE — Discharge Instructions (Signed)

## 2018-07-27 ENCOUNTER — Other Ambulatory Visit: Payer: Self-pay

## 2018-07-28 ENCOUNTER — Telehealth (INDEPENDENT_AMBULATORY_CARE_PROVIDER_SITE_OTHER): Payer: Medicare Other | Admitting: Neurology

## 2018-07-28 DIAGNOSIS — G40209 Localization-related (focal) (partial) symptomatic epilepsy and epileptic syndromes with complex partial seizures, not intractable, without status epilepticus: Secondary | ICD-10-CM

## 2018-07-28 DIAGNOSIS — F03A0 Unspecified dementia, mild, without behavioral disturbance, psychotic disturbance, mood disturbance, and anxiety: Secondary | ICD-10-CM

## 2018-07-28 DIAGNOSIS — F0390 Unspecified dementia without behavioral disturbance: Secondary | ICD-10-CM

## 2018-07-28 MED ORDER — LEVETIRACETAM 500 MG PO TABS: ORAL_TABLET | ORAL | 3 refills | Status: DC

## 2018-07-28 MED ORDER — PHENYTOIN SODIUM EXTENDED 100 MG PO CAPS: 300.0000 mg | ORAL_CAPSULE | Freq: Every day | ORAL | 3 refills | Status: DC
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
## 2018-08-01 DIAGNOSIS — M353 Polymyalgia rheumatica: Secondary | ICD-10-CM | POA: Diagnosis not present

## 2018-08-01 DIAGNOSIS — K31811 Angiodysplasia of stomach and duodenum with bleeding: Secondary | ICD-10-CM | POA: Diagnosis not present

## 2018-08-01 DIAGNOSIS — I10 Essential (primary) hypertension: Secondary | ICD-10-CM | POA: Diagnosis not present

## 2018-08-21 ENCOUNTER — Other Ambulatory Visit: Payer: Self-pay | Admitting: Internal Medicine

## 2018-08-21 NOTE — Telephone Encounter (Signed)
New Message      *STAT* If patient is at the pharmacy, call can be transferred to refill team.   1. Which medications need to be refilled? (please list name of each medication and dose if known) Eliquis 5mg    2. Which pharmacy/location (including street and city if local pharmacy) is medication to be sent to? [facility] on [address] [phone]  3. Do they need a 30 day or 90 day supply? 30 or 90  Also is wondering if there is a cheaper option  Please call

## 2018-08-21 NOTE — Telephone Encounter (Signed)
There is a drug interaction with Eliquis and Dilantin. Dilantin can decrease the concentration of Eliquis. Patient had a GI  bleed on warfarin. Message sent to Dr. Lovena Le to advice. Dr. Rayann Heman is off this week. Dr Lovena Le covering. Awaiting response from Dr. Lovena Le to advice.

## 2018-08-22 ENCOUNTER — Telehealth: Payer: Self-pay | Admitting: Pharmacist

## 2018-08-22 MED ORDER — WARFARIN SODIUM 2.5 MG PO TABS: ORAL_TABLET | ORAL | 0 refills | Status: DC

## 2018-08-22 NOTE — Telephone Encounter (Signed)
[place] from [name] office called back and states that Dr. Felipa Eth agrees with recommendation to resume warfarin and discontinue Eliquis due to increased stroke risk.   Will contact pt's daughter to discuss above.   Spoke with Shirlean Mylar at [facility] who will cancel order for Eliquis and it was not yet been picked up.   Spoke with [name] to let her know rx not picked up yet.   We discussed change to warfarin. Pt will resume with previous dosage of warfarin tomorrow (5mg  daily, 2.5mg  M,W,F) and scheduled for INR check on 6/10. She states understanding and requests that Dr. Rayann Heman be made aware of events and that she is contacted with any addition input from him.   Will route to Dr. Rayann Heman as Juluis Rainier and for additional input. Safety Zone has been entered for transitions of care.

## 2018-08-22 NOTE — Telephone Encounter (Signed)
Pt daughter called yesterday for refill on Eliquis (see refill) encounter. After completion of call it was uncovered that pt is also on dilantin, which is contraindicated with Eliquis due to decreased levels of Eliquis. Discussed options with Dr. Lovena Le (covering for Dr. Rayann Heman) and will transition back to warfarin until discussed with Dr. Rayann Heman on his return.    LM for daughter to call back about interaction.

## 2018-08-22 NOTE — Telephone Encounter (Signed)
Spoke with patient daughter who is very upset that he has been taking Eliquis and dilantin together. She is upset that this was not recognized as an interaction prior to this as the hospital should have had his records. She would like for Dr. Felipa Eth to weigh in on this matter.   LM for [place], [name] RN to discuss. Will await call back.

## 2018-08-30 ENCOUNTER — Ambulatory Visit (INDEPENDENT_AMBULATORY_CARE_PROVIDER_SITE_OTHER): Payer: Medicare Other | Admitting: *Deleted

## 2018-08-30 ENCOUNTER — Other Ambulatory Visit: Payer: Self-pay

## 2018-08-30 DIAGNOSIS — I4892 Unspecified atrial flutter: Secondary | ICD-10-CM | POA: Diagnosis not present

## 2018-08-30 DIAGNOSIS — Z5181 Encounter for therapeutic drug level monitoring: Secondary | ICD-10-CM

## 2018-08-30 LAB — POCT INR: INR: 2.1 (ref 2.0–3.0)

## 2018-08-30 NOTE — Patient Instructions (Addendum)
Description   Continue taking 2 tablets daily except 1 tablet on Mondays, Wednesdays, and Fridays. Recheck INR in 2 week. Prednisone dose continued to 10mg  daily except 15mg  on Mondays, Wednesdays and Fridays, call us with this dose changes. [phone]

## 2018-09-06 ENCOUNTER — Telehealth: Payer: Self-pay

## 2018-09-06 NOTE — Telephone Encounter (Signed)
Called the pt but a msg came on stating the wireless customer you are trying to reach is not available at this time please try your call again later.Marland Kitchen announcement

## 2018-09-08 DIAGNOSIS — I10 Essential (primary) hypertension: Secondary | ICD-10-CM | POA: Diagnosis not present

## 2018-09-08 DIAGNOSIS — G40909 Epilepsy, unspecified, not intractable, without status epilepticus: Secondary | ICD-10-CM | POA: Diagnosis not present

## 2018-09-08 DIAGNOSIS — M353 Polymyalgia rheumatica: Secondary | ICD-10-CM | POA: Diagnosis not present

## 2018-09-08 DIAGNOSIS — R269 Unspecified abnormalities of gait and mobility: Secondary | ICD-10-CM | POA: Diagnosis not present

## 2018-09-08 DIAGNOSIS — D509 Iron deficiency anemia, unspecified: Secondary | ICD-10-CM | POA: Diagnosis not present

## 2018-09-14 ENCOUNTER — Other Ambulatory Visit: Payer: Self-pay | Admitting: Internal Medicine

## 2018-09-14 NOTE — Telephone Encounter (Signed)
Prescription refill request for Coumadin received. Pt is overdue to get his INR checked. Called pt and spoke to pt's daughter [name], was able to make an appointment for 09/27/2018.  Asked her if pt had enough Coumadin to get to his next appointment. She said she was unsure but asked to refill prescription. Will send in for a 1 month supply.

## 2018-09-15 ENCOUNTER — Other Ambulatory Visit: Payer: Self-pay | Admitting: Internal Medicine

## 2018-09-15 NOTE — Telephone Encounter (Signed)
Pt overdue for an appt, refill was sent for a 1 month supply on yesterday. Will deny this refill as pt is overdue and 1 month supply already sent.

## 2018-09-20 ENCOUNTER — Telehealth: Payer: Self-pay

## 2018-09-20 NOTE — Telephone Encounter (Signed)

## 2018-09-27 ENCOUNTER — Other Ambulatory Visit: Payer: Self-pay

## 2018-09-27 ENCOUNTER — Ambulatory Visit (INDEPENDENT_AMBULATORY_CARE_PROVIDER_SITE_OTHER): Payer: Medicare Other | Admitting: *Deleted

## 2018-09-27 DIAGNOSIS — Z5181 Encounter for therapeutic drug level monitoring: Secondary | ICD-10-CM | POA: Diagnosis not present

## 2018-09-27 DIAGNOSIS — I4892 Unspecified atrial flutter: Secondary | ICD-10-CM | POA: Diagnosis not present

## 2018-09-27 LAB — POCT INR: INR: 4.6 — AB (ref 2.0–3.0)

## 2018-09-27 NOTE — Patient Instructions (Signed)
Description   Do not take any Coumadin today and No Coumadin tomorrow then continue taking 2 tablets daily except 1 tablet on Mondays, Wednesdays, and Fridays. Recheck INR in 9 days. Prednisone dose continued to 10mg  daily except 15mg  on Mondays, Wednesdays and Fridays, call us with this dose changes. [phone]

## 2018-10-02 DIAGNOSIS — D500 Iron deficiency anemia secondary to blood loss (chronic): Secondary | ICD-10-CM | POA: Diagnosis not present

## 2018-10-02 DIAGNOSIS — K31819 Angiodysplasia of stomach and duodenum without bleeding: Secondary | ICD-10-CM | POA: Diagnosis not present

## 2018-10-03 ENCOUNTER — Telehealth: Payer: Self-pay

## 2018-10-03 NOTE — Telephone Encounter (Signed)
lmom for prescreen  

## 2018-10-06 ENCOUNTER — Ambulatory Visit (INDEPENDENT_AMBULATORY_CARE_PROVIDER_SITE_OTHER): Payer: Medicare Other

## 2018-10-06 ENCOUNTER — Other Ambulatory Visit: Payer: Self-pay

## 2018-10-06 DIAGNOSIS — I4892 Unspecified atrial flutter: Secondary | ICD-10-CM

## 2018-10-06 DIAGNOSIS — Z5181 Encounter for therapeutic drug level monitoring: Secondary | ICD-10-CM

## 2018-10-06 LAB — POCT INR: INR: 3.9 — AB (ref 2.0–3.0)

## 2018-10-06 NOTE — Patient Instructions (Signed)
Description   Do not take any Coumadin today and No Coumadin tomorrow then start taking 2 tablets daily except 1 tablet on Mondays, Wednesdays, Fridays, and Saturdays. Recheck INR in 2 weeks. Prednisone dose continued to 10mg  daily except 15mg  on Mondays, Wednesdays and Fridays, call us with this dose changes. [phone]

## 2018-10-12 ENCOUNTER — Other Ambulatory Visit (HOSPITAL_COMMUNITY): Payer: Self-pay

## 2018-10-13 ENCOUNTER — Other Ambulatory Visit: Payer: Self-pay

## 2018-10-13 ENCOUNTER — Ambulatory Visit (HOSPITAL_COMMUNITY)
Admission: RE | Admit: 2018-10-13 | Discharge: 2018-10-13 | Disposition: A | Discharge status: Home / Self Care | Payer: Medicare Other | Source: Ambulatory Visit | Attending: Gastroenterology | Admitting: Gastroenterology

## 2018-10-13 DIAGNOSIS — D509 Iron deficiency anemia, unspecified: Secondary | ICD-10-CM | POA: Insufficient documentation

## 2018-10-13 MED ORDER — SODIUM CHLORIDE 0.9 % IV SOLN
510.0000 mg | INTRAVENOUS | Status: DC
  Administered 2018-10-13: 510 mg via INTRAVENOUS
  Filled 2018-10-13: qty 17

## 2018-10-18 ENCOUNTER — Telehealth: Payer: Self-pay | Admitting: Pharmacist

## 2018-10-18 NOTE — Telephone Encounter (Signed)

## 2018-10-19 ENCOUNTER — Inpatient Hospital Stay (HOSPITAL_COMMUNITY)
Admission: EM | Admit: 2018-10-19 | Discharge: 2018-10-24 | DRG: 918 | Disposition: A | Discharge status: Skilled Nursing Facility | Payer: Medicare Other | Attending: Student | Admitting: Student

## 2018-10-19 ENCOUNTER — Encounter (HOSPITAL_COMMUNITY): Payer: Self-pay | Admitting: Emergency Medicine

## 2018-10-19 ENCOUNTER — Other Ambulatory Visit: Payer: Self-pay

## 2018-10-19 DIAGNOSIS — I483 Typical atrial flutter: Secondary | ICD-10-CM | POA: Diagnosis present

## 2018-10-19 DIAGNOSIS — Y92009 Unspecified place in unspecified non-institutional (private) residence as the place of occurrence of the external cause: Secondary | ICD-10-CM

## 2018-10-19 DIAGNOSIS — Z7952 Long term (current) use of systemic steroids: Secondary | ICD-10-CM

## 2018-10-19 DIAGNOSIS — T420X1A Poisoning by hydantoin derivatives, accidental (unintentional), initial encounter: Secondary | ICD-10-CM | POA: Diagnosis not present

## 2018-10-19 DIAGNOSIS — R5381 Other malaise: Secondary | ICD-10-CM | POA: Diagnosis not present

## 2018-10-19 DIAGNOSIS — Z8673 Personal history of transient ischemic attack (TIA), and cerebral infarction without residual deficits: Secondary | ICD-10-CM

## 2018-10-19 DIAGNOSIS — R269 Unspecified abnormalities of gait and mobility: Secondary | ICD-10-CM | POA: Diagnosis not present

## 2018-10-19 DIAGNOSIS — Z87891 Personal history of nicotine dependence: Secondary | ICD-10-CM

## 2018-10-19 DIAGNOSIS — Z7901 Long term (current) use of anticoagulants: Secondary | ICD-10-CM

## 2018-10-19 DIAGNOSIS — Z8249 Family history of ischemic heart disease and other diseases of the circulatory system: Secondary | ICD-10-CM

## 2018-10-19 DIAGNOSIS — D649 Anemia, unspecified: Secondary | ICD-10-CM | POA: Diagnosis present

## 2018-10-19 DIAGNOSIS — F0390 Unspecified dementia without behavioral disturbance: Secondary | ICD-10-CM | POA: Diagnosis present

## 2018-10-19 DIAGNOSIS — I10 Essential (primary) hypertension: Secondary | ICD-10-CM | POA: Diagnosis present

## 2018-10-19 DIAGNOSIS — Z79899 Other long term (current) drug therapy: Secondary | ICD-10-CM

## 2018-10-19 DIAGNOSIS — I4892 Unspecified atrial flutter: Secondary | ICD-10-CM | POA: Diagnosis present

## 2018-10-19 DIAGNOSIS — Z20828 Contact with and (suspected) exposure to other viral communicable diseases: Secondary | ICD-10-CM | POA: Diagnosis not present

## 2018-10-19 DIAGNOSIS — R69 Illness, unspecified: Secondary | ICD-10-CM | POA: Diagnosis not present

## 2018-10-19 DIAGNOSIS — R630 Anorexia: Secondary | ICD-10-CM | POA: Diagnosis present

## 2018-10-19 DIAGNOSIS — G40909 Epilepsy, unspecified, not intractable, without status epilepticus: Secondary | ICD-10-CM

## 2018-10-19 DIAGNOSIS — I480 Paroxysmal atrial fibrillation: Secondary | ICD-10-CM | POA: Diagnosis present

## 2018-10-19 NOTE — ED Provider Notes (Signed)
TIME SEEN: 11:16 PM  CHIEF COMPLAINT: "I am sick"  HPI: Patient is a 78 year old male with history of previous stroke, hypertension, atrial flutter on Coumadin, seizures on Dilantin who presents to the emergency department with complaints that "I am sick".  He has a able to describe this any further.  He states that he has just not feeling well.  He denies headache, falls, head injury, chest pain, shortness of breath, fever, cough, nausea, vomiting, diarrhea, bloody stools, melena, numbness, tingling, focal weakness.  Was admitted to the hospital in May 2020 for GI bleed.  ROS: See HPI Constitutional: no fever  Eyes: no drainage  ENT: no runny nose   Cardiovascular:  no chest pain  Resp: no SOB  GI: no vomiting GU: no dysuria Integumentary: no rash  Allergy: no hives  Musculoskeletal: no leg swelling  Neurological: no slurred speech ROS otherwise negative  PAST MEDICAL HISTORY/PAST SURGICAL HISTORY:  Past Medical History:  Diagnosis Date  . Hiatal hernia   . HTN (hypertension)    pt denies h/o HTN though it appears he was started on spironolactone during his hospitalization 3/13  . Pancreatitis   . Seizures ([place])   . Stroke ([place])   . Typical atrial flutter ([place]) 3/13    MEDICATIONS:  Prior to Admission medications   Medication Sig Start Date End Date Taking? Authorizing Provider  ferrous sulfate 325 (65 FE) MG tablet Take 1 tablet (325 mg total) by mouth 3 (three) times daily with meals. 12/15/16   Velvet Bathe, MD  levETIRAcetam (KEPPRA) 500 MG tablet TAKE 3 TABLETS BY MOUTH TWICE A DAY 07/28/18   Cameron Sprang, MD  pantoprazole (PROTONIX) 40 MG tablet Take 1 tablet (40 mg total) by mouth daily for 30 days. 07/26/18 08/25/18  Arrien, Jimmy Picket, MD  phenytoin (DILANTIN) 100 MG ER capsule Take 3 capsules (300 mg total) by mouth daily. 07/28/18   Cameron Sprang, MD  predniSONE (DELTASONE) 10 MG tablet Take 10-15 mg by mouth See admin instructions. Taking 15 mg (1& 1/2 tablet) on  Mon, Wed, Friday, all other days 1 tablet (10mg ) daily. 03/23/17   [provider]  warfarin (COUMADIN) 2.5 MG tablet TAKE 1 OR 2 TABLETS DAILY AS DIRECTED BY COUMADIN CLINIC. 09/14/18   [name], MD    ALLERGIES:  No Known Allergies  SOCIAL HISTORY:  Social History   Tobacco Use  . Smoking status: Former Smoker    Quit date: 07/15/1966    Years since quitting: 52.2  . Smokeless tobacco: Never Used  Substance Use Topics  . Alcohol use: No    Comment: Quit greater than 5 years ago    FAMILY HISTORY: Family History  Problem Relation Age of Onset  . Hypertension Other     EXAM: BP (!) 119/57   Pulse 90   Temp 98.2 F (36.8 C)   Resp (!) 22   SpO2 100%  CONSTITUTIONAL: Alert and oriented x 3 and responds appropriately to questions.  Elderly, slow to answer questions, thin, nontoxic-appearing HEAD: Normocephalic EYES: Conjunctivae clear, pupils appear equal, EOMI ENT: normal nose; moist mucous membranes NECK: Supple, no meningismus, no nuchal rigidity, no LAD  CARD: RRR; S1 and S2 appreciated; no murmurs, no clicks, no rubs, no gallops RESP: Normal chest excursion without splinting or tachypnea; breath sounds clear and equal bilaterally; no wheezes, no rhonchi, no rales, no hypoxia or respiratory distress, speaking full sentences ABD/GI: Normal bowel sounds; non-distended; soft, non-tender, no rebound, no guarding, no peritoneal signs,  no hepatosplenomegaly RECTAL:  Normal rectal tone, no gross blood or melena, stool is normal brown appearing and soft, no hemorrhoids appreciated, nontender rectal exam, no fecal impaction BACK:  The back appears normal and is non-tender to palpation, there is no CVA tenderness EXT: Normal ROM in all joints; non-tender to palpation; no edema; normal capillary refill; no cyanosis, no calf tenderness or swelling, 2+ radial and DP pulses bilaterally    SKIN: Normal color for age and race; warm; no rash NEURO: Moves all extremities  equally, strength 5/5 in all 4 extremities, cranial nerves II through XII intact, normal speech, normal sensation diffusely PSYCH: The patient's mood and manner are appropriate. Grooming and personal hygiene are appropriate.  MEDICAL DECISION MAKING: Patient here with complaints of not feeling well.  He is not able to provide me with any specific symptoms.  It does appear he was admitted to the hospital in May for melena.  He has no melena on exam.  He has no complaints of infectious symptoms.  He is afebrile here.  Normal vital signs.  He has no focal neurologic deficits.  He states his neighbor called 911.  There is no one here at bedside to provide any extra information.  Will check basic labs today to ensure no significant anemia, electrolyte derangement.  Will check urine to see if there is any sign of dehydration or UTI.  We will check his Dilantin level as well as his INR today.  His EKG shows no ischemic abnormality, arrhythmia.  Will monitor in the ED.  ED PROGRESS: Patient's labs show hemoglobin of 8.3 which appears to be his baseline.  Dilantin level significantly elevated at 36.4.  He appears confused with repetitive questioning here but is unclear if this is normal for him.  States he ambulates with a walker at home.  He is able to ambulate here but does have a slightly ataxic gait but unclear if this is chronic.  He has no nystagmus on exam.  No hyperreflexia.  No clonus.  He is not somnolent.  Discussed with patient that acute Dilantin toxicity could be the cause of him not feeling well today.  He denies intentional overdose.  Discussed with poison control.  They recommend IV hydration, every 4 hours Dilantin levels to ensure this is trending down.  They recommend supportive care and watching for somnolence, respiratory depression.  Report cardiac issues less likely with oral dosing of phenytoin.  Recommend admission to the hospital.  Will discuss with hospitalist.   2:21 AM Discussed  patient's case with hospitalist, Dr. Myna Hidalgo.  I have recommended admission and patient (and family if present) agree with this plan. Admitting physician will place admission orders.   I reviewed all nursing notes, vitals, pertinent previous records, EKGs, lab and urine results, imaging (as available).    EKG Interpretation  Date/Time:  Thursday October 19 2018 22:50:45 EDT Ventricular Rate:  67 PR Interval:    QRS Duration: 191 QT Interval:  433 QTC Calculation: 458 R Axis:   77 Text Interpretation:  Sinus rhythm Short PR interval Right bundle branch block Anteroseptal infarct, age indeterminate Artifact in lead(s) I II aVR aVL aVF V1 No significant change since last tracing Confirmed by Pryor Curia [phone]) on 10/19/2018 11:13:59 PM        CRITICAL CARE Performed by: Cyril Mourning Seynabou Fults   Total critical care time: 45 minutes  Critical care time was exclusive of separately billable procedures and treating other patients.  Critical care was necessary to treat  or prevent imminent or life-threatening deterioration.  Critical care was time spent personally by me on the following activities: development of treatment plan with patient and/or surrogate as well as nursing, discussions with consultants, evaluation of patient's response to treatment, examination of patient, obtaining history from patient or surrogate, ordering and performing treatments and interventions, ordering and review of laboratory studies, ordering and review of radiographic studies, pulse oximetry and re-evaluation of patient's condition.     Delfino Friesen, Delice Bison, DO 10/20/18 0221

## 2018-10-19 NOTE — ED Triage Notes (Addendum)
Per EMS, patient from home, continuously states "I am sick." Daughter states hx anemia. Ambulatory with cane. Denies pain.  Hx stroke. At baseline per neighbor.  CBG 103 BP 122/66 99% RA

## 2018-10-20 ENCOUNTER — Encounter (HOSPITAL_COMMUNITY): Payer: Self-pay | Admitting: Family Medicine

## 2018-10-20 ENCOUNTER — Other Ambulatory Visit: Payer: Self-pay

## 2018-10-20 DIAGNOSIS — Y92009 Unspecified place in unspecified non-institutional (private) residence as the place of occurrence of the external cause: Secondary | ICD-10-CM | POA: Diagnosis not present

## 2018-10-20 DIAGNOSIS — Z743 Need for continuous supervision: Secondary | ICD-10-CM | POA: Diagnosis not present

## 2018-10-20 DIAGNOSIS — R410 Disorientation, unspecified: Secondary | ICD-10-CM | POA: Diagnosis not present

## 2018-10-20 DIAGNOSIS — Z7952 Long term (current) use of systemic steroids: Secondary | ICD-10-CM | POA: Diagnosis not present

## 2018-10-20 DIAGNOSIS — I483 Typical atrial flutter: Secondary | ICD-10-CM | POA: Diagnosis present

## 2018-10-20 DIAGNOSIS — Z20828 Contact with and (suspected) exposure to other viral communicable diseases: Secondary | ICD-10-CM | POA: Diagnosis present

## 2018-10-20 DIAGNOSIS — T420X1A Poisoning by hydantoin derivatives, accidental (unintentional), initial encounter: Secondary | ICD-10-CM | POA: Diagnosis not present

## 2018-10-20 DIAGNOSIS — I4892 Unspecified atrial flutter: Secondary | ICD-10-CM | POA: Diagnosis not present

## 2018-10-20 DIAGNOSIS — I10 Essential (primary) hypertension: Secondary | ICD-10-CM | POA: Diagnosis not present

## 2018-10-20 DIAGNOSIS — Z8249 Family history of ischemic heart disease and other diseases of the circulatory system: Secondary | ICD-10-CM | POA: Diagnosis not present

## 2018-10-20 DIAGNOSIS — R402411 Glasgow coma scale score 13-15, in the field [EMT or ambulance]: Secondary | ICD-10-CM | POA: Diagnosis not present

## 2018-10-20 DIAGNOSIS — Z8673 Personal history of transient ischemic attack (TIA), and cerebral infarction without residual deficits: Secondary | ICD-10-CM

## 2018-10-20 DIAGNOSIS — G40909 Epilepsy, unspecified, not intractable, without status epilepticus: Secondary | ICD-10-CM | POA: Diagnosis not present

## 2018-10-20 DIAGNOSIS — Z79899 Other long term (current) drug therapy: Secondary | ICD-10-CM | POA: Diagnosis not present

## 2018-10-20 DIAGNOSIS — D649 Anemia, unspecified: Secondary | ICD-10-CM | POA: Diagnosis not present

## 2018-10-20 DIAGNOSIS — R569 Unspecified convulsions: Secondary | ICD-10-CM | POA: Diagnosis present

## 2018-10-20 DIAGNOSIS — F0390 Unspecified dementia without behavioral disturbance: Secondary | ICD-10-CM | POA: Diagnosis present

## 2018-10-20 DIAGNOSIS — Z7901 Long term (current) use of anticoagulants: Secondary | ICD-10-CM | POA: Diagnosis not present

## 2018-10-20 DIAGNOSIS — Z87891 Personal history of nicotine dependence: Secondary | ICD-10-CM | POA: Diagnosis not present

## 2018-10-20 DIAGNOSIS — I959 Hypotension, unspecified: Secondary | ICD-10-CM | POA: Diagnosis not present

## 2018-10-20 DIAGNOSIS — K7030 Alcoholic cirrhosis of liver without ascites: Secondary | ICD-10-CM | POA: Diagnosis present

## 2018-10-20 DIAGNOSIS — G40309 Generalized idiopathic epilepsy and epileptic syndromes, not intractable, without status epilepticus: Secondary | ICD-10-CM | POA: Diagnosis present

## 2018-10-20 DIAGNOSIS — R630 Anorexia: Secondary | ICD-10-CM | POA: Diagnosis present

## 2018-10-20 DIAGNOSIS — R279 Unspecified lack of coordination: Secondary | ICD-10-CM | POA: Diagnosis not present

## 2018-10-20 DIAGNOSIS — R269 Unspecified abnormalities of gait and mobility: Secondary | ICD-10-CM | POA: Diagnosis present

## 2018-10-20 DIAGNOSIS — I480 Paroxysmal atrial fibrillation: Secondary | ICD-10-CM | POA: Diagnosis present

## 2018-10-20 DIAGNOSIS — R5381 Other malaise: Secondary | ICD-10-CM | POA: Diagnosis present

## 2018-10-20 LAB — COMPREHENSIVE METABOLIC PANEL
ALT: 12 U/L (ref 0–44)
AST: 17 U/L (ref 15–41)
Albumin: 4.1 g/dL (ref 3.5–5.0)
Alkaline Phosphatase: 79 U/L (ref 38–126)
Anion gap: 8 (ref 5–15)
BUN: 13 mg/dL (ref 8–23)
CO2: 31 mmol/L (ref 22–32)
Calcium: 8.7 mg/dL — ABNORMAL LOW (ref 8.9–10.3)
Chloride: 102 mmol/L (ref 98–111)
Creatinine, Ser: 0.63 mg/dL (ref 0.61–1.24)
GFR calc Af Amer: >60 mL/min (ref >60)
GFR calc non Af Amer: >60 mL/min (ref >60)
Glucose, Bld: 89 mg/dL (ref 70–99)
Potassium: 3.8 mmol/L (ref 3.5–5.1)
Sodium: 141 mmol/L (ref 135–145)
Total Bilirubin: 0.6 mg/dL (ref 0.3–1.2)
Total Protein: 6.6 g/dL (ref 6.5–8.1)

## 2018-10-20 LAB — CBC
HCT: 25.8 % — ABNORMAL LOW (ref 39.0–52.0)
Hemoglobin: 7.6 g/dL — ABNORMAL LOW (ref 13.0–17.0)
MCH: 27 pg (ref 26.0–34.0)
MCHC: 29.5 g/dL — ABNORMAL LOW (ref 30.0–36.0)
MCV: 91.5 fL (ref 80.0–100.0)
Platelets: 242 10*3/uL (ref 150–400)
RBC: 2.82 MIL/uL — ABNORMAL LOW (ref 4.22–5.81)
RDW: 27.1 % — ABNORMAL HIGH (ref 11.5–15.5)
WBC: 4.9 10*3/uL (ref 4.0–10.5)
nRBC: 0.4 % — ABNORMAL HIGH (ref 0.0–0.2)

## 2018-10-20 LAB — SARS CORONAVIRUS 2 BY RT PCR (HOSPITAL ORDER, PERFORMED IN ~~LOC~~ HOSPITAL LAB): SARS Coronavirus 2: NEGATIVE

## 2018-10-20 LAB — BASIC METABOLIC PANEL
Anion gap: 6 (ref 5–15)
BUN: 12 mg/dL (ref 8–23)
CO2: 30 mmol/L (ref 22–32)
Calcium: 8.5 mg/dL — ABNORMAL LOW (ref 8.9–10.3)
Chloride: 102 mmol/L (ref 98–111)
Creatinine, Ser: 0.51 mg/dL — ABNORMAL LOW (ref 0.61–1.24)
GFR calc Af Amer: >60 mL/min (ref >60)
GFR calc non Af Amer: >60 mL/min (ref >60)
Glucose, Bld: 84 mg/dL (ref 70–99)
Potassium: 3.6 mmol/L (ref 3.5–5.1)
Sodium: 138 mmol/L (ref 135–145)

## 2018-10-20 LAB — CBC WITH DIFFERENTIAL/PLATELET
Abs Immature Granulocytes: 0.02 10*3/uL (ref 0.00–0.07)
Basophils Absolute: 0.1 10*3/uL (ref 0.0–0.1)
Basophils Relative: 1 %
Eosinophils Absolute: 0.1 10*3/uL (ref 0.0–0.5)
Eosinophils Relative: 2 %
HCT: 27.2 % — ABNORMAL LOW (ref 39.0–52.0)
Hemoglobin: 8.3 g/dL — ABNORMAL LOW (ref 13.0–17.0)
Immature Granulocytes: 0 %
Lymphocytes Relative: 21 %
Lymphs Abs: 1.1 10*3/uL (ref 0.7–4.0)
MCH: 27.6 pg (ref 26.0–34.0)
MCHC: 30.5 g/dL (ref 30.0–36.0)
MCV: 90.4 fL (ref 80.0–100.0)
Monocytes Absolute: 0.9 10*3/uL (ref 0.1–1.0)
Monocytes Relative: 17 %
Neutro Abs: 3.1 10*3/uL (ref 1.7–7.7)
Neutrophils Relative %: 59 %
Platelets: 242 10*3/uL (ref 150–400)
RBC: 3.01 MIL/uL — ABNORMAL LOW (ref 4.22–5.81)
RDW: 26.9 % — ABNORMAL HIGH (ref 11.5–15.5)
WBC: 5.3 10*3/uL (ref 4.0–10.5)
nRBC: 0 % (ref 0.0–0.2)

## 2018-10-20 LAB — URINALYSIS, ROUTINE W REFLEX MICROSCOPIC
Bacteria, UA: NONE SEEN
Bilirubin Urine: NEGATIVE
Glucose, UA: NEGATIVE mg/dL
Ketones, ur: NEGATIVE mg/dL
Leukocytes,Ua: NEGATIVE
Nitrite: NEGATIVE
Protein, ur: NEGATIVE mg/dL
Specific Gravity, Urine: 1.018 (ref 1.005–1.030)
pH: 5 (ref 5.0–8.0)

## 2018-10-20 LAB — PROTIME-INR
INR: 3 — ABNORMAL HIGH (ref 0.8–1.2)
Prothrombin Time: 30.8 seconds — ABNORMAL HIGH (ref 11.4–15.2)

## 2018-10-20 LAB — GLUCOSE, CAPILLARY: Glucose-Capillary: 86 mg/dL (ref 70–99)

## 2018-10-20 LAB — PHENYTOIN LEVEL, TOTAL
Phenytoin Lvl: 36.4 ug/mL (ref 10.0–20.0)
Phenytoin Lvl: 33.7 ug/mL (ref 10.0–20.0)
Phenytoin Lvl: 29.6 ug/mL — ABNORMAL HIGH (ref 10.0–20.0)
Phenytoin Lvl: 28.3 ug/mL — ABNORMAL HIGH (ref 10.0–20.0)
Phenytoin Lvl: 29 ug/mL — ABNORMAL HIGH (ref 10.0–20.0)
Phenytoin Lvl: 25.2 ug/mL — ABNORMAL HIGH (ref 10.0–20.0)

## 2018-10-20 MED ORDER — SODIUM CHLORIDE 0.9% FLUSH
3.0000 mL | Freq: Two times a day (BID) | INTRAVENOUS | Status: DC
  Administered 2018-10-21 – 2018-10-24 (×6): 3 mL via INTRAVENOUS

## 2018-10-20 MED ORDER — SODIUM CHLORIDE 0.9 % IV BOLUS (SEPSIS)
1000.0000 mL | Freq: Once | INTRAVENOUS | Status: AC
  Administered 2018-10-20: 1000 mL via INTRAVENOUS

## 2018-10-20 MED ORDER — ACETAMINOPHEN 650 MG RE SUPP: 650.0000 mg | Freq: Four times a day (QID) | RECTAL | Status: DC | PRN

## 2018-10-20 MED ORDER — SODIUM CHLORIDE 0.9 % IV SOLN
INTRAVENOUS | Status: DC
  Administered 2018-10-20: 02:00:00 via INTRAVENOUS

## 2018-10-20 MED ORDER — LEVETIRACETAM 500 MG PO TABS
1500.0000 mg | ORAL_TABLET | Freq: Two times a day (BID) | ORAL | Status: DC
  Administered 2018-10-20 – 2018-10-24 (×9): 1500 mg via ORAL
  Filled 2018-10-20 (×10): qty 3

## 2018-10-20 MED ORDER — SODIUM CHLORIDE 0.9 % IV SOLN
INTRAVENOUS | Status: DC
  Administered 2018-10-20: 03:00:00 via INTRAVENOUS

## 2018-10-20 MED ORDER — ONDANSETRON HCL 4 MG/2ML IJ SOLN: 4.0000 mg | Freq: Four times a day (QID) | INTRAMUSCULAR | Status: DC | PRN

## 2018-10-20 MED ORDER — ACETAMINOPHEN 325 MG PO TABS: 650.0000 mg | ORAL_TABLET | Freq: Four times a day (QID) | ORAL | Status: DC | PRN

## 2018-10-20 MED ORDER — ONDANSETRON HCL 4 MG PO TABS: 4.0000 mg | ORAL_TABLET | Freq: Four times a day (QID) | ORAL | Status: DC | PRN

## 2018-10-20 MED ORDER — PANTOPRAZOLE SODIUM 40 MG PO TBEC
40.0000 mg | DELAYED_RELEASE_TABLET | Freq: Every day | ORAL | Status: DC
  Administered 2018-10-20 – 2018-10-24 (×5): 40 mg via ORAL
  Filled 2018-10-20 (×5): qty 1

## 2018-10-20 MED ORDER — POLYETHYLENE GLYCOL 3350 17 G PO PACK
17.0000 g | PACK | Freq: Every day | ORAL | Status: DC | PRN
  Administered 2018-10-24: 17 g via ORAL
  Filled 2018-10-20: qty 1

## 2018-10-20 MED ORDER — LACTATED RINGERS IV SOLN
INTRAVENOUS | Status: DC
  Administered 2018-10-20 – 2018-10-22 (×4): via INTRAVENOUS

## 2018-10-20 MED ORDER — WARFARIN - PHARMACIST DOSING INPATIENT
Freq: Every day | Status: DC
  Administered 2018-10-23: 18:00:00

## 2018-10-20 NOTE — Progress Notes (Signed)
[facility] PLAN OF CARE NOTE Patient: [name] Calvin VLD:[id number]   PCP: [name], MD DOB: 03/03/1941   DOA: 10/19/2018   DOS: 10/20/2018    Patient was admitted by my colleague Dr. Myna Hidalgo earlier on 10/20/2018. I have reviewed the H&P as well as assessment and plan and agree with the same. Important changes in the plan are listed below.  Plan of care: Principal Problem:   Dilantin toxicity, accidental or unintentional, initial encounter Active Problems:   Essential hypertension   Seizure disorder (HCC)   Paroxysmal atrial flutter (HCC)   Normochromic anemia   History of CVA in adulthood significantly confused and unable to follow complex 3 step commands. Will continue current care.   Author: Berle Mull, MD [facility] 10/20/2018 1:13 PM   If 7PM-7AM, please contact night-coverage at [email or web address]

## 2018-10-20 NOTE — Progress Notes (Signed)
ANTICOAGULATION CONSULT NOTE - Initial Consult  Pharmacy Consult for warfarin Indication: atrial fibrillation  No Known Allergies  Patient Measurements: Height: 6' (182.9 cm) Weight: 128 lb 12.8 oz (58.4 kg) IBW/kg (Calculated) : 77.6 Heparin Dosing Weight:   Vital Signs: Temp: 98.6 F (37 C) (07/31 0356) Temp Source: Oral (07/31 0356) BP: 114/64 (07/31 0356) Pulse Rate: 65 (07/31 0356)  Labs: Recent Labs    10/19/18 2237 10/20/18 0017 10/20/18 0410  HGB 8.3*  --  7.6*  HCT 27.2*  --  25.8*  PLT 242  --  242  LABPROT  --  30.8*  --   INR  --  3.0*  --   CREATININE 0.63  --  0.51*    Estimated Creatinine Clearance: 61.8 mL/min (A) (by C-G formula based on SCr of 0.51 mg/dL (L)).   Medical History: Past Medical History:  Diagnosis Date  . Hiatal hernia   . HTN (hypertension)    pt denies h/o HTN though it appears he was started on spironolactone during his hospitalization 3/13  . Pancreatitis   . Seizures ([place])   . Stroke ([place])   . Typical atrial flutter ([place]) 3/13    Medications:  Medications Prior to Admission  Medication Sig Dispense Refill Last Dose  . levETIRAcetam (KEPPRA) 500 MG tablet TAKE 3 TABLETS BY MOUTH TWICE A DAY (Patient taking differently: Take 1,500 mg by mouth 2 (two) times daily. ) 540 tablet 3 10/19/2018 at Unknown time  . pantoprazole (PROTONIX) 40 MG tablet Take 1 tablet (40 mg total) by mouth daily for 30 days. 30 tablet 0 10/19/2018 at Unknown time  . phenytoin (DILANTIN) 100 MG ER capsule Take 3 capsules (300 mg total) by mouth daily. 270 capsule 3 10/19/2018 at Unknown time  . warfarin (COUMADIN) 2.5 MG tablet TAKE 1 OR 2 TABLETS DAILY AS DIRECTED BY COUMADIN CLINIC. 50 tablet 0 10/19/2018 at Unknown time  . ferrous sulfate 325 (65 FE) MG tablet Take 1 tablet (325 mg total) by mouth 3 (three) times daily with meals. (Patient not taking: Reported on 10/20/2018) 60 tablet 0 Not Taking at Unknown time   Scheduled:  . levETIRAcetam  1,500 mg  Oral BID  . pantoprazole  40 mg Oral Daily  . sodium chloride flush  3 mL Intravenous Q12H    Assessment: Patient on chronic warfarin for afib.  INR on admit 3.  Goal 2-3.  Last dose warfarin noted 7/30 at unknown time.    Goal of Therapy:  INR 2-3    Plan:  Daily INR No warfarin at this time.  Tyler Deis, Shea Stakes Crowford 10/20/2018,5:31 AM

## 2018-10-20 NOTE — ED Notes (Signed)
ED TO INPATIENT HANDOFF REPORT  ED Nurse Name and Phone #: jon wled   S Name/Age/Gender [name] 78 y.o. male Room/Bed: WA01/WA01  Code Status   Code Status: Prior  Home/SNF/Other Patient oriented to: self and place Is this baseline? No   Triage Complete: Triage complete  Chief Complaint Weakness  Triage Note Per EMS, patient from home, continuously states "I am sick." Daughter states hx anemia. Ambulatory with cane. Denies pain.  Hx stroke. At baseline per neighbor.  CBG 103 BP 122/66 99% RA   Allergies No Known Allergies  Level of Care/Admitting Diagnosis ED Disposition    ED Disposition Condition [facility]: [place] [[id number]]  Level of Care: Telemetry [5]  Admit to tele based on following criteria: Monitor QTC interval  Covid Evaluation: Asymptomatic Screening Protocol (No Symptoms)  Diagnosis: Dilantin toxicity, accidental or unintentional, initial encounter [5732202]  Admitting Physician: Vianne Bulls [[id number]]  Attending Physician: Vianne Bulls [[id number]]  PT Class (Do Not Modify): Observation [104]  PT Acc Code (Do Not Modify): Observation [10022]       B Medical/Surgery History Past Medical History:  Diagnosis Date  . Hiatal hernia   . HTN (hypertension)    pt denies h/o HTN though it appears he was started on spironolactone during his hospitalization 3/13  . Pancreatitis   . Seizures ([place])   . Stroke ([place])   . Typical atrial flutter ([place]) 3/13   Past Surgical History:  Procedure Laterality Date  . ESOPHAGOGASTRODUODENOSCOPY N/A 11/30/2015   Procedure: ESOPHAGOGASTRODUODENOSCOPY (EGD);  Surgeon: Wonda Horner, MD;  Location: [facility] ENDOSCOPY;  Service: Endoscopy;  Laterality: N/A;  . ESOPHAGOGASTRODUODENOSCOPY (EGD) WITH PROPOFOL N/A 07/23/2018   Procedure: ESOPHAGOGASTRODUODENOSCOPY (EGD) WITH PROPOFOL;  Surgeon: Otis Brace, MD;  Location: [facility] ENDOSCOPY;  Service: Gastroenterology;   Laterality: N/A;  . FEMUR IM NAIL Left 12/08/2016  . FEMUR IM NAIL Left 12/07/2016   Procedure: INTRAMEDULLARY (IM) NAIL FEMORAL;  Surgeon: Renette Butters, MD;  Location: [place];  Service: Orthopedics;  Laterality: Left;  . GIVENS CAPSULE STUDY N/A 07/24/2018   Procedure: GIVENS CAPSULE STUDY;  Surgeon: Otis Brace, MD;  Location: [place];  Service: Gastroenterology;  Laterality: N/A;  . HIP SURGERY Left 01/2017  . HOT HEMOSTASIS N/A 07/23/2018   Procedure: HOT HEMOSTASIS (ARGON PLASMA COAGULATION/BICAP);  Surgeon: Otis Brace, MD;  Location: [name] ENDOSCOPY;  Service: Gastroenterology;  Laterality: N/A;  . no surgical history  06/2011     A IV Location/Drains/Wounds Patient Lines/Drains/Airways Status   Active Line/Drains/Airways    Name:   Placement date:   Placement time:   Site:   Days:   Peripheral IV 10/20/18 Right Forearm   10/20/18    0056    Forearm   less than 1          Intake/Output Last 24 hours No intake or output data in the 24 hours ending 10/20/18 0303  Labs/Imaging Results for orders placed or performed during the hospital encounter of 10/19/18 (from the past 48 hour(s))  CBC with Differential     Status: Abnormal   Collection Time: 10/19/18 10:37 PM  Result Value Ref Range   WBC 5.3 4.0 - 10.5 K/uL   RBC 3.01 (L) 4.22 - 5.81 MIL/uL   Hemoglobin 8.3 (L) 13.0 - 17.0 g/dL   HCT 27.2 (L) 39.0 - 52.0 %   MCV 90.4 80.0 - 100.0 fL   MCH 27.6 26.0 - 34.0 pg  MCHC 30.5 30.0 - 36.0 g/dL   RDW 26.9 (H) 11.5 - 15.5 %   Platelets 242 150 - 400 K/uL   nRBC 0.0 0.0 - 0.2 %   Neutrophils Relative % 59 %   Neutro Abs 3.1 1.7 - 7.7 K/uL   Lymphocytes Relative 21 %   Lymphs Abs 1.1 0.7 - 4.0 K/uL   Monocytes Relative 17 %   Monocytes Absolute 0.9 0.1 - 1.0 K/uL   Eosinophils Relative 2 %   Eosinophils Absolute 0.1 0.0 - 0.5 K/uL   Basophils Relative 1 %   Basophils Absolute 0.1 0.0 - 0.1 K/uL   Immature Granulocytes 0 %   Abs Immature Granulocytes 0.02  0.00 - 0.07 K/uL   Tear Drop Cells PRESENT    Target Cells PRESENT     Comment: Performed at [facility], [place] [address] [place], [place] [address]  Comprehensive metabolic panel     Status: Abnormal   Collection Time: 10/19/18 10:37 PM  Result Value Ref Range   Sodium 141 135 - 145 mmol/L   Potassium 3.8 3.5 - 5.1 mmol/L   Chloride 102 98 - 111 mmol/L   CO2 31 22 - 32 mmol/L   Glucose, Bld 89 70 - 99 mg/dL   BUN 13 8 - 23 mg/dL   Creatinine, Ser 0.63 0.61 - 1.24 mg/dL   Calcium 8.7 (L) 8.9 - 10.3 mg/dL   Total Protein 6.6 6.5 - 8.1 g/dL   Albumin 4.1 3.5 - 5.0 g/dL   AST 17 15 - 41 U/L   ALT 12 0 - 44 U/L   Alkaline Phosphatase 79 38 - 126 U/L   Total Bilirubin 0.6 0.3 - 1.2 mg/dL   GFR calc non Af Amer >60 >60 mL/min   GFR calc Af Amer >60 >60 mL/min   Anion gap 8 5 - 15    Comment: Performed at [facility], [place] [address], [place], [place] [address]  Protime-INR     Status: Abnormal   Collection Time: 10/20/18 12:17 AM  Result Value Ref Range   Prothrombin Time 30.8 (H) 11.4 - 15.2 seconds   INR 3.0 (H) 0.8 - 1.2    Comment: (NOTE) INR goal varies based on device and disease states. Performed at [name], [place] [address] [place], [place] [address]   Phenytoin level, total     Status: Abnormal   Collection Time: 10/20/18 12:17 AM  Result Value Ref Range   Phenytoin Lvl 36.4 (HH) 10.0 - 20.0 ug/mL    Comment: CRITICAL RESULT CALLED TO, READ BACK BY AND VERIFIED WITH: Makoto Sellitto,J RN @0140  ON 10/20/2018 JACKSON,K Performed at [facility], [place] [address], [place],  [address]   Urinalysis, Routine w reflex microscopic     Status: Abnormal   Collection Time: 10/20/18  1:20 AM  Result Value Ref Range   Color, Urine YELLOW YELLOW   APPearance CLEAR CLEAR   Specific Gravity, Urine 1.018 1.005 - 1.030   pH 5.0 5.0 - 8.0   Glucose, UA NEGATIVE NEGATIVE mg/dL   Hgb urine dipstick MODERATE  (A) NEGATIVE   Bilirubin Urine NEGATIVE NEGATIVE   Ketones, ur NEGATIVE NEGATIVE mg/dL   Protein, ur NEGATIVE NEGATIVE mg/dL   Nitrite NEGATIVE NEGATIVE   Leukocytes,Ua NEGATIVE NEGATIVE   RBC / HPF 21-50 0 - 5 RBC/hpf   WBC, UA 0-5 0 - 5 WBC/hpf   Bacteria, UA NONE SEEN NONE SEEN   Squamous Epithelial /  LPF 0-5 0 - 5   Mucus PRESENT     Comment: Performed at [facility], [place] [address], [place], [place] [address]   No results found.  Pending Labs Unresulted Labs (From admission, onward)    Start     Ordered   10/20/18 0400  Phenytoin level, total  Now then every 4 hours,   STAT     10/20/18 0206   10/20/18 0230  SARS Coronavirus 2 (CEPHEID - Performed in [place] hospital lab), Hosp Order  (Asymptomatic Patients Labs)  Once,   STAT    Question:  Rule Out  Answer:  Yes   10/20/18 0229   Signed and Held  Basic metabolic panel  Tomorrow morning,   R     Signed and Held   Signed and Held  CBC  Tomorrow morning,   R     Signed and Held          Vitals/Pain Today's Vitals   10/19/18 2345 10/20/18 0000 10/20/18 0030 10/20/18 0218  BP: 129/90 123/65 118/80 108/90  Pulse: 67 65 69 77  Resp: 15 19 17  (!) 24  Temp:    98.6 F (37 C)  TempSrc:    Oral  SpO2: 94% 100% 100% 99%  PainSc:    0-No pain    Isolation Precautions No active isolations  Medications Medications  0.9 %  sodium chloride infusion (has no administration in time range)  sodium chloride 0.9 % bolus 1,000 mL (1,000 mLs Intravenous New Bag/Given 10/20/18 0217)    Mobility non-ambulatory Moderate fall risk   Focused Assessments Neuro Assessment Handoff:  Swallow screen pass? Yes          Neuro Assessment: Exceptions to WDL Neuro Checks:           R Recommendations: See Admitting Provider Note  Report given to:   Additional Notes:  medication toxicity

## 2018-10-20 NOTE — ED Notes (Signed)
Provider notified 36.4 dilatin level

## 2018-10-20 NOTE — H&P (Addendum)
History and Physical    [name] GTX:[id number] DOB: [birth year] DOA: 10/19/2018  PCP: [name], MD   Patient coming from: Home   Chief Complaint: [name]   HPI: [name] is a 78 y.o. male with medical history significant for paroxysmal atrial flutter on Coumadin, history of CVA, seizure disorder, hypertension, and anemia, now presenting to the emergency department for evaluation of nonspecific malaise.  Patient has some difficulty providing the history but explains that he began feeling poorly in general couple days ago, has not eaten anything over that interval although he denies abdominal pain, nausea, or vomiting.  Patient also denies any fevers, chills, chest pain, cough, or shortness of breath.  He reports strict adherence with his medications which he states are set out for him by a caretaker, and does not feel that there is any way that he could have inadvertently taken too much.  Reports that he quit drinking alcohol years ago.  Denies fall or trauma.  Patient follows with neurology and his seizures have been managed with Keppra and Dilantin.  Neurologist has noted that the Dilantin levels have been supratherapeutic, but the patient had been asymptomatic with this and as prior medication changes resulted in seizures, he has been continued on the same regimen.  ED Course: Upon arrival to the ED, patient is found to be afebrile, saturating mid 90s, and with stable blood pressure.  EKG features a sinus rhythm with right bundle branch block.  Chemistry panel is unremarkable.  CBC is notable for stable chronic normocytic anemia.  INR is 3.0.  Urinalysis notable for moderate hemoglobin.  Phenytoin level is critically elevated to 36.4.  Poison control was consulted by the ED physician and recommendation was for hospital admission, IV fluids, every 4 hour Dilantin levels, and ongoing supportive care.  Review of Systems:  Unable to complete ROS secondary to the patient's  clinical condition.  Past Medical History:  Diagnosis Date  . Hiatal hernia   . HTN (hypertension)    pt denies h/o HTN though it appears he was started on spironolactone during his hospitalization 3/13  . Pancreatitis   . Seizures ([place])   . Stroke ([place])   . Typical atrial flutter ([place]) 3/13    Past Surgical History:  Procedure Laterality Date  . ESOPHAGOGASTRODUODENOSCOPY N/A 11/30/2015   Procedure: ESOPHAGOGASTRODUODENOSCOPY (EGD);  Surgeon: Wonda Horner, MD;  Location: [name] ENDOSCOPY;  Service: Endoscopy;  Laterality: N/A;  . ESOPHAGOGASTRODUODENOSCOPY (EGD) WITH PROPOFOL N/A 07/23/2018   Procedure: ESOPHAGOGASTRODUODENOSCOPY (EGD) WITH PROPOFOL;  Surgeon: Otis Brace, MD;  Location: [facility] ENDOSCOPY;  Service: Gastroenterology;  Laterality: N/A;  . FEMUR IM NAIL Left 12/08/2016  . FEMUR IM NAIL Left 12/07/2016   Procedure: INTRAMEDULLARY (IM) NAIL FEMORAL;  Surgeon: Renette Butters, MD;  Location: [place];  Service: Orthopedics;  Laterality: Left;  . GIVENS CAPSULE STUDY N/A 07/24/2018   Procedure: GIVENS CAPSULE STUDY;  Surgeon: Otis Brace, MD;  Location: [place];  Service: Gastroenterology;  Laterality: N/A;  . HIP SURGERY Left 01/2017  . HOT HEMOSTASIS N/A 07/23/2018   Procedure: HOT HEMOSTASIS (ARGON PLASMA COAGULATION/BICAP);  Surgeon: Otis Brace, MD;  Location: [name] ENDOSCOPY;  Service: Gastroenterology;  Laterality: N/A;  . no surgical history  06/2011     reports that he quit smoking about 52 years ago. He has never used smokeless tobacco. He reports that he does not drink alcohol or use drugs.  No Known Allergies  Family History  Problem Relation Age of Onset  .  Hypertension Other      Prior to Admission medications   Medication Sig Start Date End Date Taking? Authorizing Provider  ferrous sulfate 325 (65 FE) MG tablet Take 1 tablet (325 mg total) by mouth 3 (three) times daily with meals. 12/15/16   Velvet Bathe, MD  levETIRAcetam (KEPPRA) 500 MG  tablet TAKE 3 TABLETS BY MOUTH TWICE A DAY 07/28/18   Cameron Sprang, MD  pantoprazole (PROTONIX) 40 MG tablet Take 1 tablet (40 mg total) by mouth daily for 30 days. 07/26/18 08/25/18  Arrien, Jimmy Picket, MD  phenytoin (DILANTIN) 100 MG ER capsule Take 3 capsules (300 mg total) by mouth daily. 07/28/18   Cameron Sprang, MD  predniSONE (DELTASONE) 10 MG tablet Take 10-15 mg by mouth See admin instructions. Taking 15 mg (1& 1/2 tablet) on Mon, Wed, Friday, all other days 1 tablet (10mg ) daily. 03/23/17   [provider]  warfarin (COUMADIN) 2.5 MG tablet TAKE 1 OR 2 TABLETS DAILY AS DIRECTED BY COUMADIN CLINIC. 09/14/18   [name], MD    Physical Exam: Vitals:   10/19/18 2345 10/20/18 0000 10/20/18 0030 10/20/18 0218  BP: 129/90 123/65 118/80 108/90  Pulse: 67 65 69 77  Resp: 15 19 17  (!) 24  Temp:    98.6 F (37 C)  TempSrc:    Oral  SpO2: 94% 100% 100% 99%    Constitutional: NAD, calm, frail  Eyes: PERTLA, lids and conjunctivae normal ENMT: Mucous membranes are moist. Posterior pharynx clear of any exudate or lesions.   Neck: normal, supple, no masses, no thyromegaly Respiratory: clear to auscultation bilaterally, no wheezing, no crackles. No accessory muscle use.  Cardiovascular: S1 & S2 heard, regular rate and rhythm. No extremity edema.   Abdomen: No distension, no tenderness, soft. Bowel sounds active.  Musculoskeletal: no clubbing / cyanosis. No joint deformity upper and lower extremities.   Skin: no significant rashes, lesions, ulcers. Warm, dry, well-perfused. Neurologic: No gross facial asymmetry. Sensation intact, patellar DTR intact. Moving all extremities.  Psychiatric: Alert and oriented to person, place, and situation. Pleasant, cooperative.    Labs on Admission: I have personally reviewed following labs and imaging studies  CBC: Recent Labs  Lab 10/19/18 2237  WBC 5.3  NEUTROABS 3.1  HGB 8.3*  HCT 27.2*  MCV 90.4  PLT 409   Basic Metabolic Panel:  Recent Labs  Lab 10/19/18 2237  NA 141  K 3.8  CL 102  CO2 31  GLUCOSE 89  BUN 13  CREATININE 0.63  CALCIUM 8.7*   GFR: CrCl cannot be calculated (Unknown ideal weight.). Liver Function Tests: Recent Labs  Lab 10/19/18 2237  AST 17  ALT 12  ALKPHOS 79  BILITOT 0.6  PROT 6.6  ALBUMIN 4.1   No results for input(s): LIPASE, AMYLASE in the last 168 hours. No results for input(s): AMMONIA in the last 168 hours. Coagulation Profile: Recent Labs  Lab 10/20/18 0017  INR 3.0*   Cardiac Enzymes: No results for input(s): CKTOTAL, CKMB, CKMBINDEX, TROPONINI in the last 168 hours. BNP (last 3 results) No results for input(s): PROBNP in the last 8760 hours. HbA1C: No results for input(s): HGBA1C in the last 72 hours. CBG: No results for input(s): GLUCAP in the last 168 hours. Lipid Profile: No results for input(s): CHOL, HDL, LDLCALC, TRIG, CHOLHDL, LDLDIRECT in the last 72 hours. Thyroid Function Tests: No results for input(s): TSH, T4TOTAL, FREET4, T3FREE, THYROIDAB in the last 72 hours. Anemia Panel: No results for input(s): VITAMINB12,  FOLATE, FERRITIN, TIBC, IRON, RETICCTPCT in the last 72 hours. Urine analysis:    Component Value Date/Time   COLORURINE YELLOW 10/20/2018 0120   APPEARANCEUR CLEAR 10/20/2018 0120   LABSPEC 1.018 10/20/2018 0120   PHURINE 5.0 10/20/2018 0120   GLUCOSEU NEGATIVE 10/20/2018 0120   HGBUR MODERATE (A) 10/20/2018 0120   BILIRUBINUR NEGATIVE 10/20/2018 0120   KETONESUR NEGATIVE 10/20/2018 0120   PROTEINUR NEGATIVE 10/20/2018 0120   UROBILINOGEN 0.2 06/02/2011 0300   NITRITE NEGATIVE 10/20/2018 0120   LEUKOCYTESUR NEGATIVE 10/20/2018 0120   Sepsis Labs: @LABRCNTIP (procalcitonin:4,lacticidven:4) )No results found for this or any previous visit (from the past 240 hour(s)).   Radiological Exams on Admission: No results found.  EKG: Independently reviewed. Sinus rhythm, chronic RBBB.   Assessment/Plan   1. Dilantin toxicity  -  Presents with a couple days of non-specific malaise, loss of appetite, and gait difficulty, and is found to have critically elevated Dilantin level  - Per Poison Control recommendations, continue IVF hydration, hold Dilantin, q4h Dilantin levels, and supportive care    2. Seizure disorder  - Hold Dilantin as above while following levels, continue Keppra    3. Paroxysmal atrial flutter  - In sinus rhythm in ED  - CHADS-VASc is at least 3 (age x2, CVA x2) - Continue warfarin with pharmacy assistance    4. History of CVA  - Continue anticoagulation in setting of PAF     PPE: Mask, face shield  DVT prophylaxis: warfarin  Code Status: Full  Family Communication: Discussed with patient  Consults called: None  Admission status: Observation     Vianne Bulls, MD [facility] Pager [phone]  If 7PM-7AM, please contact night-coverage [email or web address] Password [email or web address]  10/20/2018, 2:54 AM

## 2018-10-20 NOTE — Progress Notes (Signed)
CRITICAL VALUE ALERT  Critical Value:  Dilantin 33.7  Date & Time Notied: 10/20/18  4:20 AM   Provider Notified: Jeannette Corpus  Orders Received/Actions taken:

## 2018-10-21 DIAGNOSIS — I10 Essential (primary) hypertension: Secondary | ICD-10-CM

## 2018-10-21 DIAGNOSIS — D649 Anemia, unspecified: Secondary | ICD-10-CM

## 2018-10-21 LAB — CBC WITH DIFFERENTIAL/PLATELET
Abs Immature Granulocytes: 0.02 10*3/uL (ref 0.00–0.07)
Basophils Absolute: 0.1 10*3/uL (ref 0.0–0.1)
Basophils Relative: 1 %
Eosinophils Absolute: 0.2 10*3/uL (ref 0.0–0.5)
Eosinophils Relative: 4 %
HCT: 24.4 % — ABNORMAL LOW (ref 39.0–52.0)
Hemoglobin: 7.2 g/dL — ABNORMAL LOW (ref 13.0–17.0)
Immature Granulocytes: 0 %
Lymphocytes Relative: 23 %
Lymphs Abs: 1.1 10*3/uL (ref 0.7–4.0)
MCH: 26.8 pg (ref 26.0–34.0)
MCHC: 29.5 g/dL — ABNORMAL LOW (ref 30.0–36.0)
MCV: 90.7 fL (ref 80.0–100.0)
Monocytes Absolute: 0.9 10*3/uL (ref 0.1–1.0)
Monocytes Relative: 19 %
Neutro Abs: 2.5 10*3/uL (ref 1.7–7.7)
Neutrophils Relative %: 53 %
Platelets: 227 10*3/uL (ref 150–400)
RBC: 2.69 MIL/uL — ABNORMAL LOW (ref 4.22–5.81)
RDW: 26.8 % — ABNORMAL HIGH (ref 11.5–15.5)
WBC: 4.7 10*3/uL (ref 4.0–10.5)
nRBC: 0 % (ref 0.0–0.2)

## 2018-10-21 LAB — COMPREHENSIVE METABOLIC PANEL
ALT: 10 U/L (ref 0–44)
AST: 12 U/L — ABNORMAL LOW (ref 15–41)
Albumin: 3.3 g/dL — ABNORMAL LOW (ref 3.5–5.0)
Alkaline Phosphatase: 68 U/L (ref 38–126)
Anion gap: 7 (ref 5–15)
BUN: 13 mg/dL (ref 8–23)
CO2: 29 mmol/L (ref 22–32)
Calcium: 8 mg/dL — ABNORMAL LOW (ref 8.9–10.3)
Chloride: 103 mmol/L (ref 98–111)
Creatinine, Ser: 0.56 mg/dL — ABNORMAL LOW (ref 0.61–1.24)
GFR calc Af Amer: >60 mL/min (ref >60)
GFR calc non Af Amer: >60 mL/min (ref >60)
Glucose, Bld: 78 mg/dL (ref 70–99)
Potassium: 3.6 mmol/L (ref 3.5–5.1)
Sodium: 139 mmol/L (ref 135–145)
Total Bilirubin: 0.6 mg/dL (ref 0.3–1.2)
Total Protein: 5.5 g/dL — ABNORMAL LOW (ref 6.5–8.1)

## 2018-10-21 LAB — PHENYTOIN LEVEL, TOTAL
Phenytoin Lvl: 24.4 ug/mL — ABNORMAL HIGH (ref 10.0–20.0)
Phenytoin Lvl: 24.7 ug/mL — ABNORMAL HIGH (ref 10.0–20.0)
Phenytoin Lvl: 25.1 ug/mL — ABNORMAL HIGH (ref 10.0–20.0)
Phenytoin Lvl: 22.8 ug/mL — ABNORMAL HIGH (ref 10.0–20.0)
Phenytoin Lvl: 20.9 ug/mL — ABNORMAL HIGH (ref 10.0–20.0)

## 2018-10-21 LAB — VITAMIN B12: Vitamin B-12: 905 pg/mL (ref 180–914)

## 2018-10-21 LAB — GLUCOSE, CAPILLARY: Glucose-Capillary: 74 mg/dL (ref 70–99)

## 2018-10-21 LAB — RETICULOCYTES
Immature Retic Fract: 23.2 % — ABNORMAL HIGH (ref 2.3–15.9)
RBC.: 2.94 MIL/uL — ABNORMAL LOW (ref 4.22–5.81)
Retic Count, Absolute: 22.3 10*3/uL (ref 19.0–186.0)
Retic Ct Pct: 0.8 % (ref 0.4–3.1)

## 2018-10-21 LAB — FOLATE: Folate: 4.9 ng/mL — ABNORMAL LOW (ref >5.9)

## 2018-10-21 LAB — FERRITIN: Ferritin: 397 ng/mL — ABNORMAL HIGH (ref 24–336)

## 2018-10-21 LAB — IRON AND TIBC
Iron: 66 ug/dL (ref 45–182)
Saturation Ratios: 33 % (ref 17.9–39.5)
TIBC: 201 ug/dL — ABNORMAL LOW (ref 250–450)
UIBC: 135 ug/dL

## 2018-10-21 LAB — PROTIME-INR
INR: 2.9 — ABNORMAL HIGH (ref 0.8–1.2)
Prothrombin Time: 30.2 seconds — ABNORMAL HIGH (ref 11.4–15.2)

## 2018-10-21 LAB — MAGNESIUM: Magnesium: 1.4 mg/dL — ABNORMAL LOW (ref 1.7–2.4)

## 2018-10-21 MED ORDER — FERROUS SULFATE 325 (65 FE) MG PO TABS
325.0000 mg | ORAL_TABLET | Freq: Three times a day (TID) | ORAL | Status: DC
  Administered 2018-10-21 – 2018-10-24 (×8): 325 mg via ORAL
  Filled 2018-10-21 (×8): qty 1

## 2018-10-21 MED ORDER — WARFARIN SODIUM 1 MG PO TABS
1.5000 mg | ORAL_TABLET | Freq: Once | ORAL | Status: AC
  Administered 2018-10-21: 19:00:00 1.5 mg via ORAL
  Filled 2018-10-21: qty 1

## 2018-10-21 MED ORDER — MAGNESIUM SULFATE 2 GM/50ML IV SOLN
2.0000 g | Freq: Once | INTRAVENOUS | Status: AC
  Administered 2018-10-21: 2 g via INTRAVENOUS
  Filled 2018-10-21: qty 50

## 2018-10-21 MED ORDER — FOLIC ACID 1 MG PO TABS
1.0000 mg | ORAL_TABLET | Freq: Every day | ORAL | Status: DC
  Administered 2018-10-21 – 2018-10-24 (×4): 1 mg via ORAL
  Filled 2018-10-21 (×4): qty 1

## 2018-10-21 NOTE — Progress Notes (Signed)
ANTICOAGULATION CONSULT NOTE - Initial Consult  Pharmacy Consult for warfarin Indication: atrial fibrillation  No Known Allergies  Patient Measurements: Height: 6' (182.9 cm) Weight: 128 lb 12.8 oz (58.4 kg) IBW/kg (Calculated) : 77.6  Vital Signs: Temp: 98.6 F (37 C) (08/01 0357) Temp Source: Oral (08/01 0357) BP: 111/79 (08/01 0357) Pulse Rate: 81 (08/01 0357)  Labs: Recent Labs    10/19/18 2237 10/20/18 0017 10/20/18 0410 10/21/18 0535  HGB 8.3*  --  7.6* 7.2*  HCT 27.2*  --  25.8* 24.4*  PLT 242  --  242 227  LABPROT  --  30.8*  --  30.2*  INR  --  3.0*  --  2.9*  CREATININE 0.63  --  0.51* 0.56*    Estimated Creatinine Clearance: 61.8 mL/min (A) (by C-G formula based on SCr of 0.56 mg/dL (L)).   Medical History: Past Medical History:  Diagnosis Date  . Hiatal hernia   . HTN (hypertension)    pt denies h/o HTN though it appears he was started on spironolactone during his hospitalization 3/13  . Pancreatitis   . Seizures ([place])   . Stroke ([place])   . Typical atrial flutter ([place]) 3/13    Scheduled:  . levETIRAcetam  1,500 mg Oral BID  . pantoprazole  40 mg Oral Daily  . sodium chloride flush  3 mL Intravenous Q12H  . Warfarin - Pharmacist Dosing Inpatient   Does not apply q1800    Assessment: Patient takes warfarin PTA for atrial fibrillation. Pharmacy consulted to dose/monitor warfarin while inpatient.   PTA dose (confirmed with [name] clinic notes): -warfarin 5 mg PO Sun, Tues, Thurs -warfarin 2.5 mg PO all other days of the week Lase dose PTA: 7/30  INR on admission = 3 is upper end of therapeutic range  Patient admitted with unintentional phenytoin toxicity (phenytoin level = 33.7 on admission). Drug interaction exists between phenytoin and warfarin - each can enhance the effects of the other medication.   Significant Events: -Warfarin held 7/31 -Phenytoin held since admission  Today, 10/21/18  INR 2.9 = therapeutic  Phenytoin currently  being held. Most recent phenytoin level = 25.1. Albumin 3.3  Hgb 7.2 - low, slightly decreased.   Plt - WNL  No noted signs/symptoms of bleeding in chart  25% of meal charted as eaten  Goal of Therapy:  INR 2-3   Plan:   Warfarin 1.5 mg PO once this evening  INR daily   CBC tomorrow morning  Closely monitor for any signs/symptoms of bleeding or thrombosis  Lenis Noon, PharmD 10/21/2018,10:01 AM

## 2018-10-21 NOTE — Progress Notes (Signed)
PROGRESS NOTE  [name] [name] MGQ:[id number] DOB: 1940-07-16   PCP: [name], MD  Patient is from: Home  DOA: 10/19/2018 LOS: 1  Brief Narrative / Interim history: 78 year old male with PAF on Coumadin, history of CVA, seizure disorder, HTN and anemia presenting with nonspecific malaise and admitted for Dilantin toxicity likely from accidental/unintentional overdose. Dilantin level elevated to 36.4.  Poison control consulted and recommended hospital admission with IV fluids and Dilantin check every 4 hours.  Subjective: No major events overnight of this morning.  Mental status improved.  Dilantin level downtrending.  Objective: Vitals:   10/20/18 0356 10/20/18 1255 10/20/18 2122 10/21/18 0357  BP: 114/64 119/68 107/70 111/79  Pulse: 65 71 68 81  Resp: 18 17 20 16   Temp: 98.6 F (37 C) 98.7 F (37.1 C) 98.4 F (36.9 C) 98.6 F (37 C)  TempSrc: Oral Oral Oral Oral  SpO2: 100% 100% 100% 100%  Weight: 58.4 kg     Height: 6' (1.829 m)       Intake/Output Summary (Last 24 hours) at 10/21/2018 1359 Last data filed at 10/21/2018 0600 Gross per 24 hour  Intake 1717.17 ml  Output 750 ml  Net 967.17 ml   Filed Weights   10/20/18 0356  Weight: 58.4 kg    Examination:  GENERAL: No acute distress.  Appears well.  HEENT: MMM.  Vision and hearing grossly intact.  NECK: Supple.  No apparent JVD.  RESP:  No IWOB. Good air movement bilaterally. CVS:  RRR. Heart sounds normal.  ABD/GI/GU: Bowel sounds present. Soft. Non tender.  MSK/EXT:  Moves extremities. No apparent deformity or edema.  SKIN: no apparent skin lesion or wound NEURO: Awake, alert and oriented to self and place.  No gross deficit.  PSYCH: Calm. Normal affect.    I have personally reviewed the following labs and images:  Radiology Studies: No results found.  Microbiology: Recent Results (from the past 240 hour(s))  SARS Coronavirus 2 (CEPHEID - Performed in [place] hospital lab), Hosp Order      Status: None   Collection Time: 10/20/18  2:30 AM   Specimen: Nasopharyngeal Swab  Result Value Ref Range Status   SARS Coronavirus 2 NEGATIVE NEGATIVE Final    Comment: (NOTE) If result is NEGATIVE SARS-CoV-2 target nucleic acids are NOT DETECTED. The SARS-CoV-2 RNA is generally detectable in upper and lower  respiratory specimens during the acute phase of infection. The lowest  concentration of SARS-CoV-2 viral copies this assay can detect is 250  copies / mL. A negative result does not preclude SARS-CoV-2 infection  and should not be used as the sole basis for treatment or other  patient management decisions.  A negative result may occur with  improper specimen collection / handling, submission of specimen other  than nasopharyngeal swab, presence of viral mutation(s) within the  areas targeted by this assay, and inadequate number of viral copies  (<250 copies / mL). A negative result must be combined with clinical  observations, patient history, and epidemiological information. If result is POSITIVE SARS-CoV-2 target nucleic acids are DETECTED. The SARS-CoV-2 RNA is generally detectable in upper and lower  respiratory specimens dur ing the acute phase of infection.  Positive  results are indicative of active infection with SARS-CoV-2.  Clinical  correlation with patient history and other diagnostic information is  necessary to determine patient infection status.  Positive results do  not rule out bacterial infection or co-infection with other viruses. If result is PRESUMPTIVE POSTIVE SARS-CoV-2 nucleic  acids MAY BE PRESENT.   A presumptive positive result was obtained on the submitted specimen  and confirmed on repeat testing.  While 2019 novel coronavirus  (SARS-CoV-2) nucleic acids may be present in the submitted sample  additional confirmatory testing may be necessary for epidemiological  and / or clinical management purposes  to differentiate between  SARS-CoV-2 and other  Sarbecovirus currently known to infect humans.  If clinically indicated additional testing with an alternate test  methodology [phone]) is advised. The SARS-CoV-2 RNA is generally  detectable in upper and lower respiratory sp ecimens during the acute  phase of infection. The expected result is Negative. Fact Sheet for Patients:  StrictlyIdeas.no Fact Sheet for Healthcare Providers: BankingDealers.co.za This test is not yet approved or cleared by the [place] FDA and has been authorized for detection and/or diagnosis of SARS-CoV-2 by FDA under an Emergency Use Authorization (EUA).  This EUA will remain in effect (meaning this test can be used) for the duration of the COVID-19 declaration under Section 564(b)(1) of the Act, 21 U.S.C. section 360bbb-3(b)(1), unless the authorization is terminated or revoked sooner. Performed at [facility], [place] [address] [place], [place] [address]     Sepsis Labs: Invalid input(s): PROCALCITONIN, LACTICIDVEN  Urine analysis:    Component Value Date/Time   COLORURINE YELLOW 10/20/2018 0120   APPEARANCEUR CLEAR 10/20/2018 0120   LABSPEC 1.018 10/20/2018 0120   PHURINE 5.0 10/20/2018 0120   GLUCOSEU NEGATIVE 10/20/2018 0120   HGBUR MODERATE (A) 10/20/2018 0120   BILIRUBINUR NEGATIVE 10/20/2018 0120   KETONESUR NEGATIVE 10/20/2018 0120   PROTEINUR NEGATIVE 10/20/2018 0120   UROBILINOGEN 0.2 06/02/2011 0300   NITRITE NEGATIVE 10/20/2018 0120   LEUKOCYTESUR NEGATIVE 10/20/2018 0120    Anemia Panel: Recent Labs    10/21/18 0911  VITAMINB12 905  FOLATE 4.9*  FERRITIN 397*  TIBC 201*  IRON 66  RETICCTPCT 0.8    Thyroid Function Tests: No results for input(s): TSH, T4TOTAL, FREET4, T3FREE, THYROIDAB in the last 72 hours.  Lipid Profile: No results for input(s): CHOL, HDL, LDLCALC, TRIG, CHOLHDL, LDLDIRECT in the last 72 hours.  CBG: Recent Labs  Lab 10/20/18  0807 10/21/18 0818  GLUCAP 86 74    HbA1C: No results for input(s): HGBA1C in the last 72 hours.  BNP (last 3 results): No results for input(s): PROBNP in the last 8760 hours.  Cardiac Enzymes: No results for input(s): CKTOTAL, CKMB, CKMBINDEX, TROPONINI in the last 168 hours.  Coagulation Profile: Recent Labs  Lab 10/20/18 0017 10/21/18 0535  INR 3.0* 2.9*    Liver Function Tests: Recent Labs  Lab 10/19/18 2237 10/21/18 0535  AST 17 12*  ALT 12 10  ALKPHOS 79 68  BILITOT 0.6 0.6  PROT 6.6 5.5*  ALBUMIN 4.1 3.3*   No results for input(s): LIPASE, AMYLASE in the last 168 hours. No results for input(s): AMMONIA in the last 168 hours.  Basic Metabolic Panel: Recent Labs  Lab 10/19/18 2237 10/20/18 0410 10/21/18 0535  NA 141 138 139  K 3.8 3.6 3.6  CL 102 102 103  CO2 31 30 29   GLUCOSE 89 84 78  BUN 13 12 13   CREATININE 0.63 0.51* 0.56*  CALCIUM 8.7* 8.5* 8.0*  MG  --   --  1.4*   GFR: Estimated Creatinine Clearance: 61.8 mL/min (A) (by C-G formula based on SCr of 0.56 mg/dL (L)).  CBC: Recent Labs  Lab 10/19/18 2237 10/20/18 0410 10/21/18 0535  WBC 5.3 4.9 4.7  NEUTROABS 3.1  --  2.5  HGB 8.3* 7.6* 7.2*  HCT 27.2* 25.8* 24.4*  MCV 90.4 91.5 90.7  PLT 242 242 227    Procedures:  None  Microbiology summarized: COVID-19 negative.  Assessment & Plan: Dilantin toxicity: Level downtrending.  Symptoms improving. -Continue IV fluids -Check Dilantin level every 8 hours per poison control recommendation  History of seizure: -Hold Dilantin level -Continue Keppra  Paroxysmal atrial fibrillation: Chads BASC score 4. -Continue warfarin per pharmacy  History of CVA: Stable.  No focal neuro deficit. -Anticoagulation as above  Normocytic anemia: Baseline Hgb 7-8.  Anemia panel shows low folate -Hgb 7.2 this morning likely dilutional from IV fluid. -Start folate -Continue home iron  Hypomagnesemia: -Replenish and recheck  History of  dementia without behavioral disturbance: -Frequent reorientation and delirium precautions  DVT prophylaxis: On warfarin Code Status: Full code Family Communication: Patient and/or RN. Available if any question.  Disposition Plan: Remains inpatient Consultants: Poison control   Antimicrobials: Anti-infectives (From admission, onward)   None      Sch Meds:  Scheduled Meds: . levETIRAcetam  1,500 mg Oral BID  . pantoprazole  40 mg Oral Daily  . sodium chloride flush  3 mL Intravenous Q12H  . warfarin  1.5 mg Oral ONCE-1800  . Warfarin - Pharmacist Dosing Inpatient   Does not apply q1800   Continuous Infusions: . lactated ringers 100 mL/hr at 10/21/18 1050   PRN Meds:.acetaminophen **OR** acetaminophen, ondansetron **OR** ondansetron (ZOFRAN) IV, polyethylene glycol   Taye T. Quitaque  If 7PM-7AM, please contact night-coverage [email or web address] Password [email or web address] 10/21/2018, 1:59 PM

## 2018-10-21 NOTE — Progress Notes (Signed)
Poison Control Beth called for update on patient's condition. Per poison control, may change Dilantin levels to be drawn every 8 hours instead of every 4 hours d/t improvement in pt's mental status. Poison control made aware Dilantin level has gone back up slightly- this is to be expected per [employer]. MD Gonfa made aware.

## 2018-10-22 LAB — CBC
HCT: 25 % — ABNORMAL LOW (ref 39.0–52.0)
Hemoglobin: 7.6 g/dL — ABNORMAL LOW (ref 13.0–17.0)
MCH: 27.3 pg (ref 26.0–34.0)
MCHC: 30.4 g/dL (ref 30.0–36.0)
MCV: 89.9 fL (ref 80.0–100.0)
Platelets: 223 10*3/uL (ref 150–400)
RBC: 2.78 MIL/uL — ABNORMAL LOW (ref 4.22–5.81)
RDW: 27 % — ABNORMAL HIGH (ref 11.5–15.5)
WBC: 5.2 10*3/uL (ref 4.0–10.5)
nRBC: 0.4 % — ABNORMAL HIGH (ref 0.0–0.2)

## 2018-10-22 LAB — BASIC METABOLIC PANEL WITH GFR
Anion gap: 7 (ref 5–15)
BUN: 13 mg/dL (ref 8–23)
CO2: 28 mmol/L (ref 22–32)
Calcium: 8 mg/dL — ABNORMAL LOW (ref 8.9–10.3)
Chloride: 104 mmol/L (ref 98–111)
Creatinine, Ser: 0.57 mg/dL — ABNORMAL LOW (ref 0.61–1.24)
GFR calc Af Amer: >60 mL/min
GFR calc non Af Amer: >60 mL/min
Glucose, Bld: 83 mg/dL (ref 70–99)
Potassium: 4.2 mmol/L (ref 3.5–5.1)
Sodium: 139 mmol/L (ref 135–145)

## 2018-10-22 LAB — MAGNESIUM: Magnesium: 1.8 mg/dL (ref 1.7–2.4)

## 2018-10-22 LAB — PHENYTOIN LEVEL, TOTAL
Phenytoin Lvl: 19.5 ug/mL (ref 10.0–20.0)
Phenytoin Lvl: 20 ug/mL (ref 10.0–20.0)

## 2018-10-22 LAB — PROTIME-INR
INR: 2.2 — ABNORMAL HIGH (ref 0.8–1.2)
Prothrombin Time: 24.2 s — ABNORMAL HIGH (ref 11.4–15.2)

## 2018-10-22 LAB — GLUCOSE, CAPILLARY: Glucose-Capillary: 80 mg/dL (ref 70–99)

## 2018-10-22 MED ORDER — WARFARIN SODIUM 5 MG PO TABS
5.0000 mg | ORAL_TABLET | Freq: Once | ORAL | Status: AC
  Administered 2018-10-22: 5 mg via ORAL
  Filled 2018-10-22: qty 1

## 2018-10-22 NOTE — Evaluation (Addendum)
Physical Therapy Evaluation Patient Details Name: [name] MRN: [id number] DOB: [birth year] Today's Date: 10/22/2018   History of Present Illness  78 yo male admitted with dilantin toxicity. Hx of L hip fx s/p IM nail 2018, possible cognitive deficits, Sz, A flutter, CVA, anemia, falls.  Clinical Impression  On eval, pt required Min assist for mobility. He walked a short distance around the room using his cane (his preferred choice of assistive device). He is very unsteady and at risk for falls. Discussed d/c plan-he plans to return home. Therapist recommendation is for SNF placement, however the pt is not agreeable to SNF placement or HHPT. He is requesting in-home meal assistance. May need a CM/SW consult to see if there are any programs he is eligible for. Will follow and progress activity as pt allows.     Follow Up Recommendations 24 hour supervision/assist;No PT follow up(Pt refuses SNF placement and HHPT f/u- "I dont need any therapy. I can do my own therapy".) He is requesting in-home meal assistance, if that is possible.    Equipment Recommendations  None recommended by PT    Recommendations for Other Services       Precautions / Restrictions Precautions Precautions: Fall Restrictions Weight Bearing Restrictions: No      Mobility  Bed Mobility Overal bed mobility: Modified Independent       Supine to sit: HOB elevated Sit to supine: HOB elevated      Transfers Overall transfer level: Needs assistance   Transfers: Sit to/from Stand Sit to Stand: Min assist;From elevated surface         General transfer comment: Assist to rise, steady. Cues for safety.  Ambulation/Gait Ambulation/Gait assistance: Min assist Gait Distance (Feet): 20 Feet Assistive device: Straight cane Gait Pattern/deviations: Ataxic     General Gait Details: Very unsteady gait. Pt is at risk for falls. He tolerated short distance well.  Stairs            Wheelchair  Mobility    Modified Rankin (Stroke Patients Only)       Balance Overall balance assessment: Needs assistance;History of Falls         Standing balance support: Single extremity supported Standing balance-Leahy Scale: Poor                               Pertinent Vitals/Pain Pain Assessment: No/denies pain    Home Living Family/patient expects to be discharged to:: Private residence Living Arrangements: Alone Available Help at Discharge: Family;Available PRN/intermittently Type of Home: House       Home Layout: Able to live on main level with bedroom/bathroom Home Equipment: Cane - single point      Prior Function Level of Independence: Needs assistance   Gait / Transfers Assistance Needed: walks with a cane, but "tries not to if I can avoid it" reports h/o falls  ADL's / Homemaking Assistance Needed: pt reports he performs ADLs unassisted. He requests meal assistance        Hand Dominance        Extremity/Trunk Assessment   Upper Extremity Assessment Upper Extremity Assessment: Defer to OT evaluation    Lower Extremity Assessment Lower Extremity Assessment: Generalized weakness    Cervical / Trunk Assessment Cervical / Trunk Assessment: Kyphotic  Communication   Communication: HOH  Cognition Arousal/Alertness: Awake/alert Behavior During Therapy: WFL for tasks assessed/performed Overall Cognitive Status: No family/caregiver present to determine baseline cognitive functioning  General Comments: poor safety awareness and insight into fall risk      General Comments      Exercises     Assessment/Plan    PT Assessment Patient needs continued PT services  PT Problem List Decreased strength;Decreased mobility;Decreased balance;Decreased safety awareness;Decreased coordination       PT Treatment Interventions DME instruction;Gait training;Therapeutic exercise;Therapeutic  activities;Patient/family education;Balance training;Functional mobility training    PT Goals (Current goals can be found in the Care Plan section)  Acute Rehab PT Goals Patient Stated Goal: home PT Goal Formulation: With patient Time For Goal Achievement: 11/06/18 Potential to Achieve Goals: Fair    Frequency Min 3X/week   Barriers to discharge        Co-evaluation               AM-PAC PT "6 Clicks" Mobility  Outcome Measure Help needed turning from your back to your side while in a flat bed without using bedrails?: None Help needed moving from lying on your back to sitting on the side of a flat bed without using bedrails?: A Little Help needed moving to and from a bed to a chair (including a wheelchair)?: A Little Help needed standing up from a chair using your arms (e.g., wheelchair or bedside chair)?: A Little Help needed to walk in hospital room?: A Little Help needed climbing 3-5 steps with a railing? : A Lot 6 Click Score: 18    End of Session Equipment Utilized During Treatment: Gait belt Activity Tolerance: Patient tolerated treatment well Patient left: in bed;with call bell/phone within reach;with bed alarm set   PT Visit Diagnosis: Muscle weakness (generalized) (M62.81);Unsteadiness on feet (R26.81);Repeated falls (R29.6);History of falling (Z91.81);Ataxic gait (R26.0)    Time: 3086-5784 PT Time Calculation (min) (ACUTE ONLY): 25 min   Charges:   PT Evaluation $PT Eval Moderate Complexity: [place], PT Acute Rehabilitation Services Pager: [phone] Office: [phone]

## 2018-10-22 NOTE — Progress Notes (Signed)
ANTICOAGULATION CONSULT NOTE - Initial Consult  Pharmacy Consult for warfarin Indication: atrial fibrillation  No Known Allergies  Patient Measurements: Height: 6' (182.9 cm) Weight: 128 lb 12.8 oz (58.4 kg) IBW/kg (Calculated) : 77.6  Vital Signs: Temp: 98.1 F (36.7 C) (08/02 0517) BP: 110/63 (08/02 0517) Pulse Rate: 71 (08/02 0517)  Labs: Recent Labs    10/20/18 0017 10/20/18 0410 10/21/18 0535 10/22/18 0548  HGB  --  7.6* 7.2* 7.6*  HCT  --  25.8* 24.4* 25.0*  PLT  --  242 227 223  LABPROT 30.8*  --  30.2* 24.2*  INR 3.0*  --  2.9* 2.2*  CREATININE  --  0.51* 0.56* 0.57*    Estimated Creatinine Clearance: 61.8 mL/min (A) (by C-G formula based on SCr of 0.57 mg/dL (L)).   Medical History: Past Medical History:  Diagnosis Date  . Hiatal hernia   . HTN (hypertension)    pt denies h/o HTN though it appears he was started on spironolactone during his hospitalization 3/13  . Pancreatitis   . Seizures ([place])   . Stroke ([place])   . Typical atrial flutter ([place]) 3/13    Scheduled:  . ferrous sulfate  325 mg Oral TID WC  . folic acid  1 mg Oral Daily  . levETIRAcetam  1,500 mg Oral BID  . pantoprazole  40 mg Oral Daily  . sodium chloride flush  3 mL Intravenous Q12H  . Warfarin - Pharmacist Dosing Inpatient   Does not apply q1800    Assessment: Patient takes warfarin PTA for atrial fibrillation. Pharmacy consulted to dose/monitor warfarin while inpatient.   PTA dose (confirmed with [facility] clinic notes): -warfarin 5 mg PO Sun, Tues, Thurs -warfarin 2.5 mg PO all other days of the week Lase dose PTA: 7/30  INR on admission = 3 is upper end of therapeutic range  Patient admitted with unintentional phenytoin toxicity (phenytoin level = 33.7 on admission). Drug interaction exists between phenytoin and warfarin - each can enhance the effects of the other medication.   Significant Events: -Warfarin held 7/31 -Phenytoin held since admission  Today, 10/22/18  INR  2.2 = therapeutic, decreased  Phenytoin currently being held. Most recent phenytoin level = 19.5. Albumin 3.3  Hgb 7.6 - low, stable  Plt - WNL  No noted signs/symptoms of bleeding in chart  No significant drug interactions with currently ordered medications  100% of meal charted as eaten  Goal of Therapy:  INR 2-3   Plan:   Warfarin 5 mg PO once this evening  INR daily   CBC tomorrow morning  Monitor for any signs/symptoms of bleeding or thrombosis  Lenis Noon, PharmD 10/22/2018,10:59 AM

## 2018-10-22 NOTE — Evaluation (Signed)
Occupational Therapy Evaluation Patient Details Name: [name] MRN: [id number] DOB: 12-04-1940 Today's Date: 10/22/2018    History of Present Illness 78 yo male admitted with dilantin toxicity. Hx of L hip fx s/p IM nail 2018, possible cognitive deficits, Sz, A flutter, CVA, anemia, falls.   Clinical Impression   PTA Pt from home, reports that he was able to perform ADL - but struggles with IADL and wants assitance with meals. He uses his cane PRN. Today he was min guard assist for in room mobility with cane, able to perform toilet transfer, sink level grooming at min guard assist, mod A for LB dressing to manage clothing and perform transfer. Pt is adamently refusing SNF despite 2 admissions in the past 6 months and falls at home. He has decreased safety awareness, and is currently not interested in [facility] therapy as well. OT will follow acutely to maximize safety and independence in ADL and functional transfers - agree with PT that referral to social work for meals on wheels program (or similar) would be helpful. OT to plan on basic cognitive assessment next session (pill box test?)    Follow Up Recommendations  SNF;Home health OT(Pt is currently declining all post-acute therapy)    Equipment Recommendations  3 in 1 bedside commode(as shower chair)    Recommendations for Other Services Other (comment)(Social Work, Warehouse manager - Meals on [employer]?)     Precautions / Restrictions Precautions Precautions: Fall Restrictions [employer] Bearing Restrictions: No      Mobility Bed Mobility Overal bed mobility: Needs Assistance Bed Mobility: Supine to Sit;Sit to Supine     Supine to sit: Supervision Sit to supine: Supervision   General bed mobility comments: increased time, HOB elevated, use of bed rails  Transfers Overall transfer level: Needs assistance Equipment used: Straight cane Transfers: Sit to/from Stand Sit to Stand: Min assist;From elevated surface          General transfer comment: Assist to rise, steady. Cues for safety.    Balance Overall balance assessment: Needs assistance;History of Falls Sitting-balance support: No upper extremity supported;Feet supported Sitting balance-Leahy Scale: Good     Standing balance support: Single extremity supported Standing balance-Leahy Scale: Poor                             ADL either performed or assessed with clinical judgement   ADL Overall ADL's : Needs assistance/impaired Eating/Feeding: Modified independent   Grooming: Min guard;Minimal assistance;Standing;Wash/dry hands;Wash/dry face;Oral care Grooming Details (indicate cue type and reason): sink level, leans against sink Upper Body Bathing: Supervision/ safety;Sitting   Lower Body Bathing: Min guard;Sitting/lateral leans   Upper Body Dressing : Set up;Sitting   Lower Body Dressing: Sit to/from stand;Moderate assistance Lower Body Dressing Details (indicate cue type and reason): min A for balanca and rise combined with clothing management Toilet Transfer: Minimal assistance;Min guard;Ambulation(SPC)   Toileting- Water quality scientist and Hygiene: Min guard;Sit to/from stand Toileting - Clothing Manipulation Details (indicate cue type and reason): use of grab bars to perform transfer     Functional mobility during ADLs: Min guard;Minimal assistance;Cueing for safety;Cane General ADL Comments: Pt with decreased safety awareness, balance deficits, 2 admissions over the past 6 months     Vision         Perception     Praxis      Pertinent Vitals/Pain Pain Assessment: No/denies pain     Hand Dominance Right   Extremity/Trunk Assessment Upper Extremity Assessment  Upper Extremity Assessment: Generalized weakness   Lower Extremity Assessment Lower Extremity Assessment: Defer to PT evaluation   Cervical / Trunk Assessment Cervical / Trunk Assessment: Kyphotic   Communication Communication Communication:  HOH   Cognition Arousal/Alertness: Awake/alert Behavior During Therapy: WFL for tasks assessed/performed;Agitated Overall Cognitive Status: No family/caregiver present to determine baseline cognitive functioning                                 General Comments: poor safety awareness and insight into fall risk, asked when asked about multiple admissions over the past 6 months or with attempts at encouraging SNF   General Comments       Exercises     Shoulder Instructions      Home Living Family/patient expects to be discharged to:: Private residence Living Arrangements: Alone Available Help at Discharge: Family;Available PRN/intermittently Type of Home: House Home Access: Stairs to enter [employer] of Steps: 1 Entrance Stairs-Rails: None Home Layout: Able to live on main level with bedroom/bathroom     Bathroom Shower/Tub: Occupational psychologist: Standard     Home Equipment: Cane - single point          Prior Functioning/Environment Level of Independence: Needs assistance  Gait / Transfers Assistance Needed: walks with a cane, but "tries not to if I can avoid it" reports h/o falls ADL's / Homemaking Assistance Needed: pt reports he performs ADLs unassisted. He requests meal assistance Communication / Swallowing Assistance Needed: HOH Comments: Reports he does not drive, "someone gets my groceries"        OT Problem List: Decreased activity tolerance;Impaired balance (sitting and/or standing);Decreased safety awareness      OT Treatment/Interventions: Self-care/ADL training;DME and/or AE instruction;Therapeutic activities;Patient/family education;Balance training    OT Goals(Current goals can be found in the care plan section) Acute Rehab OT Goals Patient Stated Goal: get a program like Meals on Wheels to assist OT Goal Formulation: With patient Time For Goal Achievement: 11/05/18 Potential to Achieve Goals: Fair ADL Goals Pt  Will Perform Grooming: with modified independence;standing Pt Will Perform Upper Body Dressing: with modified independence;sitting Pt Will Perform Lower Body Dressing: with modified independence;sit to/from stand Pt Will Transfer to Toilet: with modified independence;ambulating Pt Will Perform Toileting - Clothing Manipulation and hygiene: with modified independence;sit to/from stand  OT Frequency: Min 3X/week   Barriers to D/C: Decreased caregiver support  Pt lives alone       Co-evaluation              AM-PAC OT "6 Clicks" Daily Activity     Outcome Measure Help from another person eating meals?: None Help from another person taking care of personal grooming?: A Little Help from another person toileting, which includes using toliet, bedpan, or urinal?: A Little Help from another person bathing (including washing, rinsing, drying)?: A Lot Help from another person to put on and taking off regular upper body clothing?: None Help from another person to put on and taking off regular lower body clothing?: A Lot 6 Click Score: 18   End of Session Equipment Utilized During Treatment: Gait belt Nurse Communication: Mobility status  Activity Tolerance: Patient tolerated treatment well Patient left: in bed;with call bell/phone within reach;with bed alarm set  OT Visit Diagnosis: Unsteadiness on feet (R26.81);Other abnormalities of gait and mobility (R26.89);History of falling (Z91.81);Muscle weakness (generalized) (M62.81);Other symptoms and signs involving cognitive function;Adult, failure to thrive (R62.7)  Time: 4171-2787 OT Time Calculation (min): 16 min Charges:  OT General Charges $OT Visit: 1 Visit OT Evaluation $OT Eval Moderate Complexity: [place] OTR/L Acute Rehabilitation Services Pager: [phone] Office: [place] 10/22/2018, 4:57 PM

## 2018-10-22 NOTE — Progress Notes (Signed)
PROGRESS NOTE  [name] [facility] YKD:[id number] DOB: 29-Jun-1940   PCP: [name], MD  Patient is from: Home  DOA: 10/19/2018 LOS: 2  Brief Narrative / Interim history: 78 year old male with PAF on Coumadin, history of CVA, seizure disorder, HTN and anemia presenting with nonspecific malaise and admitted for Dilantin toxicity likely from accidental/unintentional overdose. Dilantin level elevated to 36.4.  Poison control consulted and recommended hospital admission with IV fluids and Dilantin check every 4 hours.  Subjective: No major events overnight of this morning.  Mental status improved.  Dilantin level normalized.  Denies chest pain, dyspnea, GI or GU symptoms.  Objective: Vitals:   10/21/18 0357 10/21/18 1455 10/21/18 2115 10/22/18 0517  BP: 111/79 110/62 93/65 110/63  Pulse: 81 66 76 71  Resp: 16 19 18 18   Temp: 98.6 F (37 C) 98.3 F (36.8 C) 98.4 F (36.9 C) 98.1 F (36.7 C)  TempSrc: Oral Oral Oral   SpO2: 100% 100% 99% 100%  Weight:      Height:        Intake/Output Summary (Last 24 hours) at 10/22/2018 1323 Last data filed at 10/22/2018 0500 Gross per 24 hour  Intake 1834.46 ml  Output 2600 ml  Net -765.54 ml   Filed Weights   10/20/18 0356  Weight: 58.4 kg    Examination:  GENERAL: No acute distress.  Appears well.  HEENT: MMM.  Vision and hearing grossly intact.  NECK: Supple.  No apparent JVD.  RESP:  No IWOB. Good air movement bilaterally. CVS:  RRR. Heart sounds normal.  ABD/GI/GU: Bowel sounds present. Soft. Non tender.  MSK/EXT:  Moves extremities. No apparent deformity or edema.  SKIN: no apparent skin lesion or wound NEURO: Awake, alert and oriented x4- date and year.  Very little insight into his condition.  No focal neuro deficits. PSYCH: Calm. Normal affect.   I have personally reviewed the following labs and images:  Radiology Studies: No results found.  Microbiology: Recent Results (from the past 240 hour(s))  SARS  Coronavirus 2 (CEPHEID - Performed in [place] hospital lab), Hosp Order     Status: None   Collection Time: 10/20/18  2:30 AM   Specimen: Nasopharyngeal Swab  Result Value Ref Range Status   SARS Coronavirus 2 NEGATIVE NEGATIVE Final    Comment: (NOTE) If result is NEGATIVE SARS-CoV-2 target nucleic acids are NOT DETECTED. The SARS-CoV-2 RNA is generally detectable in upper and lower  respiratory specimens during the acute phase of infection. The lowest  concentration of SARS-CoV-2 viral copies this assay can detect is 250  copies / mL. A negative result does not preclude SARS-CoV-2 infection  and should not be used as the sole basis for treatment or other  patient management decisions.  A negative result may occur with  improper specimen collection / handling, submission of specimen other  than nasopharyngeal swab, presence of viral mutation(s) within the  areas targeted by this assay, and inadequate number of viral copies  (<250 copies / mL). A negative result must be combined with clinical  observations, patient history, and epidemiological information. If result is POSITIVE SARS-CoV-2 target nucleic acids are DETECTED. The SARS-CoV-2 RNA is generally detectable in upper and lower  respiratory specimens dur ing the acute phase of infection.  Positive  results are indicative of active infection with SARS-CoV-2.  Clinical  correlation with patient history and other diagnostic information is  necessary to determine patient infection status.  Positive results do  not rule out bacterial infection  or co-infection with other viruses. If result is PRESUMPTIVE POSTIVE SARS-CoV-2 nucleic acids MAY BE PRESENT.   A presumptive positive result was obtained on the submitted specimen  and confirmed on repeat testing.  While 2019 novel coronavirus  (SARS-CoV-2) nucleic acids may be present in the submitted sample  additional confirmatory testing may be necessary for epidemiological  and / or  clinical management purposes  to differentiate between  SARS-CoV-2 and other Sarbecovirus currently known to infect humans.  If clinically indicated additional testing with an alternate test  methodology [phone]) is advised. The SARS-CoV-2 RNA is generally  detectable in upper and lower respiratory sp ecimens during the acute  phase of infection. The expected result is Negative. Fact Sheet for Patients:  StrictlyIdeas.no Fact Sheet for Healthcare Providers: BankingDealers.co.za This test is not yet approved or cleared by the [place] FDA and has been authorized for detection and/or diagnosis of SARS-CoV-2 by FDA under an Emergency Use Authorization (EUA).  This EUA will remain in effect (meaning this test can be used) for the duration of the COVID-19 declaration under Section 564(b)(1) of the Act, 21 U.S.C. section 360bbb-3(b)(1), unless the authorization is terminated or revoked sooner. Performed at [facility], [place] [address] [place], [place] [address]     Sepsis Labs: Invalid input(s): PROCALCITONIN, LACTICIDVEN  Urine analysis:    Component Value Date/Time   COLORURINE YELLOW 10/20/2018 0120   APPEARANCEUR CLEAR 10/20/2018 0120   LABSPEC 1.018 10/20/2018 0120   PHURINE 5.0 10/20/2018 0120   GLUCOSEU NEGATIVE 10/20/2018 0120   HGBUR MODERATE (A) 10/20/2018 0120   BILIRUBINUR NEGATIVE 10/20/2018 0120   KETONESUR NEGATIVE 10/20/2018 0120   PROTEINUR NEGATIVE 10/20/2018 0120   UROBILINOGEN 0.2 06/02/2011 0300   NITRITE NEGATIVE 10/20/2018 0120   LEUKOCYTESUR NEGATIVE 10/20/2018 0120    Anemia Panel: Recent Labs    10/21/18 0911  VITAMINB12 905  FOLATE 4.9*  FERRITIN 397*  TIBC 201*  IRON 66  RETICCTPCT 0.8    Thyroid Function Tests: No results for input(s): TSH, T4TOTAL, FREET4, T3FREE, THYROIDAB in the last 72 hours.  Lipid Profile: No results for input(s): CHOL, HDL, LDLCALC, TRIG,  CHOLHDL, LDLDIRECT in the last 72 hours.  CBG: Recent Labs  Lab 10/20/18 0807 10/21/18 0818 10/22/18 0751  GLUCAP 86 74 80    HbA1C: No results for input(s): HGBA1C in the last 72 hours.  BNP (last 3 results): No results for input(s): PROBNP in the last 8760 hours.  Cardiac Enzymes: No results for input(s): CKTOTAL, CKMB, CKMBINDEX, TROPONINI in the last 168 hours.  Coagulation Profile: Recent Labs  Lab 10/20/18 0017 10/21/18 0535 10/22/18 0548  INR 3.0* 2.9* 2.2*    Liver Function Tests: Recent Labs  Lab 10/19/18 2237 10/21/18 0535  AST 17 12*  ALT 12 10  ALKPHOS 79 68  BILITOT 0.6 0.6  PROT 6.6 5.5*  ALBUMIN 4.1 3.3*   No results for input(s): LIPASE, AMYLASE in the last 168 hours. No results for input(s): AMMONIA in the last 168 hours.  Basic Metabolic Panel: Recent Labs  Lab 10/19/18 2237 10/20/18 0410 10/21/18 0535 10/22/18 0548  NA 141 138 139 139  K 3.8 3.6 3.6 4.2  CL 102 102 103 104  CO2 31 30 29 28   GLUCOSE 89 84 78 83  BUN 13 12 13 13   CREATININE 0.63 0.51* 0.56* 0.57*  CALCIUM 8.7* 8.5* 8.0* 8.0*  MG  --   --  1.4* 1.8   GFR: Estimated Creatinine Clearance: 61.8 mL/min (  A) (by C-G formula based on SCr of 0.57 mg/dL (L)).  CBC: Recent Labs  Lab 10/19/18 2237 10/20/18 0410 10/21/18 0535 10/22/18 0548  WBC 5.3 4.9 4.7 5.2  NEUTROABS 3.1  --  2.5  --   HGB 8.3* 7.6* 7.2* 7.6*  HCT 27.2* 25.8* 24.4* 25.0*  MCV 90.4 91.5 90.7 89.9  PLT 242 242 227 223    Procedures:  None  Microbiology summarized: COVID-19 negative.  Assessment & Plan: Dilantin toxicity: Level level within normal range this morning.  Mental status improved.  Limited insight into his condition.  -Continue IV fluids -Waiting clearance from poison control -PT recommended SNF/HH PT but patient declining.  Not sure if he has capacity to make decision as he has limited insight into his condition.  History of seizure: -Hold Dilantin. May need to continue at  reduced dose.  Will curbside neurology. -Continue Keppra  Paroxysmal atrial fibrillation: Chads 2 vasc score 4. -Continue warfarin per pharmacy  History of CVA: Stable.  No focal neuro deficit. -Anticoagulation as above  Normocytic anemia: Baseline Hgb 7-8.  Anemia panel shows low folate -Continue home iron.  Add folic acid. -Intermittently check H&H.  Hypomagnesemia: Resolved.  History of dementia without behavioral disturbance: -Frequent reorientation and delirium precautions  DVT prophylaxis: On warfarin Code Status: Full code Family Communication: Updated patient's daughter over the phone.  She would talk to him about the recommended disposition and let us know. Disposition Plan: Remains inpatient Consultants: Poison control   Antimicrobials: Anti-infectives (From admission, onward)   None      Sch Meds:  Scheduled Meds: . ferrous sulfate  325 mg Oral TID WC  . folic acid  1 mg Oral Daily  . levETIRAcetam  1,500 mg Oral BID  . pantoprazole  40 mg Oral Daily  . sodium chloride flush  3 mL Intravenous Q12H  . warfarin  5 mg Oral ONCE-1800  . Warfarin - Pharmacist Dosing Inpatient   Does not apply q1800   Continuous Infusions: . lactated ringers 100 mL/hr at 10/22/18 0126   PRN Meds:.acetaminophen **OR** acetaminophen, ondansetron **OR** ondansetron (ZOFRAN) IV, polyethylene glycol   Taye T. [place]  If 7PM-7AM, please contact night-coverage [email or web address] Password [email or web address] 10/22/2018, 1:23 PM

## 2018-10-23 LAB — CBC
HCT: 25.5 % — ABNORMAL LOW (ref 39.0–52.0)
Hemoglobin: 7.6 g/dL — ABNORMAL LOW (ref 13.0–17.0)
MCH: 27.4 pg (ref 26.0–34.0)
MCHC: 29.8 g/dL — ABNORMAL LOW (ref 30.0–36.0)
MCV: 92.1 fL (ref 80.0–100.0)
Platelets: 220 10*3/uL (ref 150–400)
RBC: 2.77 MIL/uL — ABNORMAL LOW (ref 4.22–5.81)
RDW: 26.6 % — ABNORMAL HIGH (ref 11.5–15.5)
WBC: 4.3 10*3/uL (ref 4.0–10.5)
nRBC: 0.5 % — ABNORMAL HIGH (ref 0.0–0.2)

## 2018-10-23 LAB — PHENYTOIN LEVEL, TOTAL: Phenytoin Lvl: 16.8 ug/mL (ref 10.0–20.0)

## 2018-10-23 LAB — PROTIME-INR
INR: 1.9 — ABNORMAL HIGH (ref 0.8–1.2)
Prothrombin Time: 21.3 seconds — ABNORMAL HIGH (ref 11.4–15.2)

## 2018-10-23 MED ORDER — WARFARIN SODIUM 5 MG PO TABS
5.0000 mg | ORAL_TABLET | Freq: Once | ORAL | Status: AC
  Administered 2018-10-23: 5 mg via ORAL
  Filled 2018-10-23: qty 1

## 2018-10-23 MED ORDER — PHENYTOIN SODIUM EXTENDED 100 MG PO CAPS
200.0000 mg | ORAL_CAPSULE | Freq: Every day | ORAL | Status: DC
  Administered 2018-10-23: 200 mg via ORAL
  Filled 2018-10-23: qty 2

## 2018-10-23 NOTE — Progress Notes (Signed)
PROGRESS NOTE  [name] [facility] LKG:[id number] DOB: 06-28-1940   PCP: [name], MD  Patient is from: Home  DOA: 10/19/2018 LOS: 3  Brief Narrative / Interim history: 78 year old male with PAF on Coumadin, history of CVA, seizure disorder, HTN and anemia presenting with nonspecific malaise and admitted for Dilantin toxicity likely from accidental/unintentional overdose. Dilantin level elevated to 36.4.  Poison control consulted and recommended hospital admission with IV fluids and Dilantin check every 4 hours.  Dilantin level dropped to 16.  Patient symptoms improved. Restarted Dilantin at 200 mg nightly per neurology recommendation.  Needs Dilantin level checked on 10/30/2018.  Patient was evaluated by PT/OT who recommended SNF placement.  Initially he refused SNF although he finally agreed.  Subjective: No major events overnight of this morning.  Mental status improved.  No complaint this morning.  Resistant to SNF placement but finally agreed after we discussed the importance of this.  Dilantin level 16 this morning.  Objective: Vitals:   10/22/18 0517 10/22/18 1705 10/22/18 2047 10/23/18 0454  BP: 110/63 (!) 103/58 (!) 100/57 100/71  Pulse: 71 67 65 77  Resp: 18 16 16 18   Temp: 98.1 F (36.7 C) (!) 97.5 F (36.4 C) 98.7 F (37.1 C) 98.6 F (37 C)  TempSrc:  Oral Oral Oral  SpO2: 100% 100% 100% 94%  Weight:      Height:        Intake/Output Summary (Last 24 hours) at 10/23/2018 1319 Last data filed at 10/23/2018 0500 Gross per 24 hour  Intake -  Output 1000 ml  Net -1000 ml   Filed Weights   10/20/18 0356  Weight: 58.4 kg    Examination:  GENERAL: No acute distress.  Appears well.  HEENT: MMM.  Vision and hearing grossly intact.  NECK: Supple.  No apparent JVD.  RESP:  No IWOB. Good air movement bilaterally. CVS:  RRR. Heart sounds normal.  ABD/GI/GU: Bowel sounds present. Soft. Non tender.  MSK/EXT:  Moves extremities. No apparent deformity or edema.   SKIN: no apparent skin lesion or wound NEURO: Awake, alert and oriented x4- date and year.  Very little insight into his condition.  No focal neuro deficit. PSYCH: Calm. Normal affect.   I have personally reviewed the following labs and images:  Radiology Studies: No results found.  Microbiology: Recent Results (from the past 240 hour(s))  SARS Coronavirus 2 (CEPHEID - Performed in [place] hospital lab), Hosp Order     Status: None   Collection Time: 10/20/18  2:30 AM   Specimen: Nasopharyngeal Swab  Result Value Ref Range Status   SARS Coronavirus 2 NEGATIVE NEGATIVE Final    Comment: (NOTE) If result is NEGATIVE SARS-CoV-2 target nucleic acids are NOT DETECTED. The SARS-CoV-2 RNA is generally detectable in upper and lower  respiratory specimens during the acute phase of infection. The lowest  concentration of SARS-CoV-2 viral copies this assay can detect is 250  copies / mL. A negative result does not preclude SARS-CoV-2 infection  and should not be used as the sole basis for treatment or other  patient management decisions.  A negative result may occur with  improper specimen collection / handling, submission of specimen other  than nasopharyngeal swab, presence of viral mutation(s) within the  areas targeted by this assay, and inadequate number of viral copies  (<250 copies / mL). A negative result must be combined with clinical  observations, patient history, and epidemiological information. If result is POSITIVE SARS-CoV-2 target nucleic acids are DETECTED.  The SARS-CoV-2 RNA is generally detectable in upper and lower  respiratory specimens dur ing the acute phase of infection.  Positive  results are indicative of active infection with SARS-CoV-2.  Clinical  correlation with patient history and other diagnostic information is  necessary to determine patient infection status.  Positive results do  not rule out bacterial infection or co-infection with other viruses. If  result is PRESUMPTIVE POSTIVE SARS-CoV-2 nucleic acids MAY BE PRESENT.   A presumptive positive result was obtained on the submitted specimen  and confirmed on repeat testing.  While 2019 novel coronavirus  (SARS-CoV-2) nucleic acids may be present in the submitted sample  additional confirmatory testing may be necessary for epidemiological  and / or clinical management purposes  to differentiate between  SARS-CoV-2 and other Sarbecovirus currently known to infect humans.  If clinically indicated additional testing with an alternate test  methodology [phone]) is advised. The SARS-CoV-2 RNA is generally  detectable in upper and lower respiratory sp ecimens during the acute  phase of infection. The expected result is Negative. Fact Sheet for Patients:  StrictlyIdeas.no Fact Sheet for Healthcare Providers: BankingDealers.co.za This test is not yet approved or cleared by the [place] FDA and has been authorized for detection and/or diagnosis of SARS-CoV-2 by FDA under an Emergency Use Authorization (EUA).  This EUA will remain in effect (meaning this test can be used) for the duration of the COVID-19 declaration under Section 564(b)(1) of the Act, 21 U.S.C. section 360bbb-3(b)(1), unless the authorization is terminated or revoked sooner. Performed at [facility], [place] [address], [place], [place] [address]     Sepsis Labs: Invalid input(s): PROCALCITONIN, LACTICIDVEN  Urine analysis:    Component Value Date/Time   COLORURINE YELLOW 10/20/2018 0120   APPEARANCEUR CLEAR 10/20/2018 0120   LABSPEC 1.018 10/20/2018 0120   PHURINE 5.0 10/20/2018 0120   GLUCOSEU NEGATIVE 10/20/2018 0120   HGBUR MODERATE (A) 10/20/2018 0120   BILIRUBINUR NEGATIVE 10/20/2018 0120   KETONESUR NEGATIVE 10/20/2018 0120   PROTEINUR NEGATIVE 10/20/2018 0120   UROBILINOGEN 0.2 06/02/2011 0300   NITRITE NEGATIVE 10/20/2018 0120    LEUKOCYTESUR NEGATIVE 10/20/2018 0120    Anemia Panel: Recent Labs    10/21/18 0911  VITAMINB12 905  FOLATE 4.9*  FERRITIN 397*  TIBC 201*  IRON 66  RETICCTPCT 0.8    Thyroid Function Tests: No results for input(s): TSH, T4TOTAL, FREET4, T3FREE, THYROIDAB in the last 72 hours.  Lipid Profile: No results for input(s): CHOL, HDL, LDLCALC, TRIG, CHOLHDL, LDLDIRECT in the last 72 hours.  CBG: Recent Labs  Lab 10/20/18 0807 10/21/18 0818 10/22/18 0751  GLUCAP 86 74 80    HbA1C: No results for input(s): HGBA1C in the last 72 hours.  BNP (last 3 results): No results for input(s): PROBNP in the last 8760 hours.  Cardiac Enzymes: No results for input(s): CKTOTAL, CKMB, CKMBINDEX, TROPONINI in the last 168 hours.  Coagulation Profile: Recent Labs  Lab 10/20/18 0017 10/21/18 0535 10/22/18 0548 10/23/18 0416  INR 3.0* 2.9* 2.2* 1.9*    Liver Function Tests: Recent Labs  Lab 10/19/18 2237 10/21/18 0535  AST 17 12*  ALT 12 10  ALKPHOS 79 68  BILITOT 0.6 0.6  PROT 6.6 5.5*  ALBUMIN 4.1 3.3*   No results for input(s): LIPASE, AMYLASE in the last 168 hours. No results for input(s): AMMONIA in the last 168 hours.  Basic Metabolic Panel: Recent Labs  Lab 10/19/18 2237 10/20/18 0410 10/21/18 0535 10/22/18 0548  NA 141  138 139 139  K 3.8 3.6 3.6 4.2  CL 102 102 103 104  CO2 31 30 29 28   GLUCOSE 89 84 78 83  BUN 13 12 13 13   CREATININE 0.63 0.51* 0.56* 0.57*  CALCIUM 8.7* 8.5* 8.0* 8.0*  MG  --   --  1.4* 1.8   GFR: Estimated Creatinine Clearance: 61.8 mL/min (A) (by C-G formula based on SCr of 0.57 mg/dL (L)).  CBC: Recent Labs  Lab 10/19/18 2237 10/20/18 0410 10/21/18 0535 10/22/18 0548 10/23/18 0416  WBC 5.3 4.9 4.7 5.2 4.3  NEUTROABS 3.1  --  2.5  --   --   HGB 8.3* 7.6* 7.2* 7.6* 7.6*  HCT 27.2* 25.8* 24.4* 25.0* 25.5*  MCV 90.4 91.5 90.7 89.9 92.1  PLT 242 242 227 223 220    Procedures:  None  Microbiology summarized: COVID-19  negative.  Assessment & Plan: Dilantin toxicity: Level within normal range this morning.  Mental status improved.  Limited insight into his condition.  -Discontinue IV fluids -Restart Dilantin ER at 200 mg nightly -PT recommended SNF/HH PT but patient initially declined but finally agreeable. -CSW consulted for placement.  History of seizure: On Dilantin and Keppra at home. -Resume Dilantin ER at 200 mg nightly after discussion with neurology, Dr. Leonel Ramsay. -Neurology recommends rechecking level in 1 week which will be 10/30/2018. -Continue Keppra 1500 mg twice daily  Paroxysmal atrial fibrillation: Chads 2 vasc score 4. -Continue warfarin per pharmacy  History of CVA: Stable.  No focal neuro deficit. -Anticoagulation as above  Normocytic anemia: Baseline Hgb 7-8.  Anemia panel shows low folate -Continue home iron.  Add folic acid. -Intermittently check H&H.  Hypomagnesemia: Resolved.  History of dementia without behavioral disturbance: -Frequent reorientation and delirium precautions  DVT prophylaxis: On warfarin Code Status: Full code Family Communication: Updated patient's daughter over the phone.  Disposition Plan: Remains inpatient pending SNF placement.  COVID-19 test pending. Consultants: Poison control, neurology (over the phone)   Antimicrobials: Anti-infectives (From admission, onward)   None      Sch Meds:  Scheduled Meds: . ferrous sulfate  325 mg Oral TID WC  . folic acid  1 mg Oral Daily  . levETIRAcetam  1,500 mg Oral BID  . pantoprazole  40 mg Oral Daily  . phenytoin  200 mg Oral QHS  . sodium chloride flush  3 mL Intravenous Q12H  . warfarin  5 mg Oral ONCE-1800  . Warfarin - Pharmacist Dosing Inpatient   Does not apply q1800   Continuous Infusions:  PRN Meds:.acetaminophen **OR** acetaminophen, ondansetron **OR** ondansetron (ZOFRAN) IV, polyethylene glycol   Rosemary Mossbarger T. [place]  If 7PM-7AM, please contact night-coverage  [email or web address] Password [email or web address] 10/23/2018, 1:19 PM

## 2018-10-23 NOTE — NC FL2 (Signed)
[place] LEVEL OF CARE SCREENING TOOL     IDENTIFICATION  Patient Name: [name] Birthdate: May 25, 1940 Sex: male Admission Date (Current Location): 10/19/2018  [facility] and [place] Number:  Herbalist and Address:  [facility],  [place] [address] [place]      Provider Number: [id number]  Attending Physician Name and Address:  Mercy Riding, MD  Relative Name and Phone Number:       Current Level of Care: Hospital Recommended Level of Care: [place] Prior Approval Number:    Date Approved/Denied:   PASRR Number: [id number]  Discharge Plan: SNF    Current Diagnoses: Patient Active Problem List   Diagnosis Date Noted  . Dilantin toxicity, accidental or unintentional, initial encounter 10/20/2018  . Dilantin overdose, accidental or unintentional, initial encounter 10/20/2018  . GIB (gastrointestinal bleeding) 07/22/2018  . Current chronic use of systemic steroids 01/11/2017  . RBBB 01/11/2017  . Polymyalgia ([place]) 01/11/2017  . Fever 01/07/2017  . GI bleeding 01/06/2017  . History of CVA in adulthood 01/06/2017  . Retroperitoneal hematoma 01/06/2017  . Pressure injury of skin 12/13/2016  . Fall   . Closed fracture of femur, intertrochanteric, left, initial encounter ([place]) 12/06/2016  . Severe protein-calorie malnutrition ([place]) 04/03/2016  . Hypotension 04/01/2016  . Leukopenia 03/31/2016  . Elevated INR 03/31/2016  . Hypokalemia 03/31/2016  . Acute pain of right knee 03/01/2016  . Aspiration pneumonia ([place]) 12/01/2015  . Erosive gastropathy: Per EGD 11/30/2015 11/30/2015  . Normochromic anemia 11/28/2015  . Localization-related symptomatic epilepsy and epileptic syndromes with complex partial seizures, not intractable, without status epilepticus ([place]) 07/07/2015  . Closed Colles' fracture of right radius with routine healing 05/02/2015  . Pain 04/18/2015  . Dementia (AD) 11/16/2012  . Chronic  anticoagulation 06/30/2011  . Paroxysmal atrial flutter ([place]) 06/04/2011  . 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 SINUSITIS, CHRONIC NEC 12/14/2006  . ABUSE, ALCOHOL, CONTINUOUS 10/11/2006  . History of cardiovascular disorder 10/11/2006  . Essential hypertension 09/15/2006  . CVA 09/15/2006  . ALLERGIC RHINITIS 09/15/2006  . CIRRHOSIS, ALCOHOLIC, LIVER 90/24/0973  . Seizure disorder ([place]) 09/15/2006  . PANCREATITIS, HX OF 09/15/2006    Orientation RESPIRATION BLADDER Height & Weight     Self, Time, Situation, Place  Normal Incontinent Weight: 58.4 kg Height:  6' (182.9 cm)  BEHAVIORAL SYMPTOMS/MOOD NEUROLOGICAL BOWEL NUTRITION STATUS      Continent Diet  AMBULATORY STATUS COMMUNICATION OF NEEDS Skin   Limited Assist Verbally Normal                       Personal Care Assistance Level of Assistance  Bathing, Dressing Bathing Assistance: Maximum assistance   Dressing Assistance: Limited assistance     Functional Limitations Info             SPECIAL CARE FACTORS FREQUENCY  PT (By licensed PT), OT (By licensed OT)     PT Frequency: 5x week OT Frequency: 5x week            Contractures Contractures Info: Not present    Additional Factors Info  Code Status Code Status Info: Full             Current Medications (10/23/2018):  This is the current hospital active medication list Current Facility-Administered Medications  Medication Dose Route Frequency Provider Last Rate Last Dose  . acetaminophen (TYLENOL) tablet 650 mg  650 mg Oral Q6H PRN Opyd, Ilene Qua, MD  Or  . acetaminophen (TYLENOL) suppository 650 mg  650 mg Rectal Q6H PRN Opyd, Ilene Qua, MD      . ferrous sulfate tablet 325 mg  325 mg Oral TID WC Wendee Beavers T, MD   325 mg at 10/23/18 1335  . folic acid (FOLVITE) tablet 1 mg  1 mg Oral Daily Wendee Beavers T, MD   1 mg at 10/23/18 1004  . levETIRAcetam (KEPPRA) tablet 1,500 mg  1,500 mg Oral BID Opyd, Ilene Qua, MD   1,500 mg at  10/23/18 1004  . ondansetron (ZOFRAN) tablet 4 mg  4 mg Oral Q6H PRN Opyd, Ilene Qua, MD       Or  . ondansetron (ZOFRAN) injection 4 mg  4 mg Intravenous Q6H PRN Opyd, Ilene Qua, MD      . pantoprazole (PROTONIX) EC tablet 40 mg  40 mg Oral Daily Opyd, Ilene Qua, MD   40 mg at 10/23/18 1004  . phenytoin (DILANTIN) ER capsule 200 mg  200 mg Oral QHS Gonfa, Taye T, MD      . polyethylene glycol (MIRALAX / GLYCOLAX) packet 17 g  17 g Oral Daily PRN Opyd, Ilene Qua, MD      . sodium chloride flush (NS) 0.9 % injection 3 mL  3 mL Intravenous Q12H Opyd, Ilene Qua, MD   3 mL at 10/23/18 1335  . warfarin (COUMADIN) tablet 5 mg  5 mg Oral ONCE-1800 Green, Terri L, RPH      . Warfarin - Pharmacist Dosing Inpatient   Does not apply q1800 Opyd, Ilene Qua, MD         Discharge Medications: Please see discharge summary for a list of discharge medications.  Relevant Imaging Results:  Relevant Lab Results:   Additional Information SSN [id number]  Joaquin Courts, RN

## 2018-10-23 NOTE — TOC Initial Note (Signed)
Transition of Care [facility]) - Initial/Assessment Note    Patient Details  Name: [name] MRN: [id number] Date of Birth: [birth year]  Transition of Care [name]) CM/SW Contact:    Joaquin Courts, RN Phone Number: 10/23/2018, 2:14 PM  Clinical Narrative:    CM spoke with patient at bedside who reports he has reconsidered after talking with his daughter and has decided to look at options for SNF at dc. CM explained process and faxed FL2 with patient permission. Patient requests CM speak with his daughter, call placed, awaiting call back.                Expected Discharge Plan: Skilled Nursing Facility Barriers to Discharge: Continued Medical Work up   Patient Goals and CMS Choice Patient states their goals for this hospitalization and ongoing recovery are:: to go home      Expected Discharge Plan and Services Expected Discharge Plan: Impact   Discharge Planning Services: CM Consult   Living arrangements for the past 2 months: Single Family Home Expected Discharge Date: (unknown)               DME Arranged: N/A DME Agency: NA       HH Arranged: NA [place] [employer]: NA        Prior Living Arrangements/Services Living arrangements for the past 2 months: Single Family Home Lives with:: Self Patient language and need for interpreter reviewed:: Yes Do you feel safe going back to the place where you live?: Yes      Need for Family Participation in Patient Care: Yes (Comment) Care giver support system in place?: Yes (comment)   Criminal Activity/Legal Involvement Pertinent to Current Situation/Hospitalization: No - Comment as needed  Activities of Daily Living Home Assistive Devices/Equipment: Eyeglasses, Cane (specify quad or straight)(single point cane) ADL Screening (condition at time of admission) Patient's cognitive ability adequate to safely complete daily activities?: Yes Is the patient deaf or have difficulty hearing?: Yes(hoh) Does the  patient have difficulty seeing, even when wearing glasses/contacts?: No Does the patient have difficulty concentrating, remembering, or making decisions?: No Patient able to express need for assistance with ADLs?: Yes Does the patient have difficulty dressing or bathing?: Yes Independently performs ADLs?: No Communication: Independent Dressing (OT): Needs assistance Is this a change from baseline?: Change from baseline, expected to last >3 days Grooming: Needs assistance Is this a change from baseline?: Change from baseline, expected to last >3 days Feeding: Needs assistance Is this a change from baseline?: Change from baseline, expected to last >3 days Bathing: Needs assistance Is this a change from baseline?: Change from baseline, expected to last >3 days Toileting: Independent with device (comment) In/Out Bed: Independent Walks in Home: Independent with device (comment) Does the patient have difficulty walking or climbing stairs?: Yes Weakness of Legs: Both Weakness of Arms/Hands: None  Permission Sought/Granted                  Emotional Assessment Appearance:: Appears stated age Attitude/Demeanor/Rapport: Engaged Affect (typically observed): Accepting Orientation: : Oriented to Self, Oriented to Place, Oriented to Situation, Oriented to  Time   Psych Involvement: No (comment)  Admission diagnosis:  Dilantin toxicity, accidental or unintentional, initial encounter [T42.0X1A] Patient Active Problem List   Diagnosis Date Noted  . Dilantin toxicity, accidental or unintentional, initial encounter 10/20/2018  . Dilantin overdose, accidental or unintentional, initial encounter 10/20/2018  . GIB (gastrointestinal bleeding) 07/22/2018  . Current chronic use of systemic steroids 01/11/2017  . RBBB  01/11/2017  . Polymyalgia ([place]) 01/11/2017  . Fever 01/07/2017  . GI bleeding 01/06/2017  . History of CVA in adulthood 01/06/2017  . Retroperitoneal hematoma 01/06/2017  .  Pressure injury of skin 12/13/2016  . Fall   . Closed fracture of femur, intertrochanteric, left, initial encounter ([place]) 12/06/2016  . Severe protein-calorie malnutrition ([place]) 04/03/2016  . Hypotension 04/01/2016  . Leukopenia 03/31/2016  . Elevated INR 03/31/2016  . Hypokalemia 03/31/2016  . Acute pain of right knee 03/01/2016  . Aspiration pneumonia ([place]) 12/01/2015  . Erosive gastropathy: Per EGD 11/30/2015 11/30/2015  . Normochromic anemia 11/28/2015  . Localization-related symptomatic epilepsy and epileptic syndromes with complex partial seizures, not intractable, without status epilepticus ([place]) 07/07/2015  . Closed Colles' fracture of right radius with routine healing 05/02/2015  . Pain 04/18/2015  . Dementia (AD) 11/16/2012  . Chronic anticoagulation 06/30/2011  . Paroxysmal atrial flutter ([place]) 06/04/2011  . 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 SINUSITIS, CHRONIC NEC 12/14/2006  . ABUSE, ALCOHOL, CONTINUOUS 10/11/2006  . History of cardiovascular disorder 10/11/2006  . Essential hypertension 09/15/2006  . CVA 09/15/2006  . ALLERGIC RHINITIS 09/15/2006  . CIRRHOSIS, ALCOHOLIC, LIVER 36/62/9476  . Seizure disorder ([place]) 09/15/2006  . PANCREATITIS, HX OF 09/15/2006   PCP:  [name], MD Pharmacy:   [id number] - , [place] [address] [place] [address] Phone: [phone] Fax: [phone]  [id number] - [place], [place] - [address] [place] [address] Phone: [phone] Fax: [phone]  [facility] [place] [place] - [address] [place] [address] Phone: [phone] Fax: [phone]     Social Determinants of Health (SDOH) Interventions    Readmission Risk Interventions Readmission Risk Prevention Plan 07/25/2018  Transportation Screening Complete  PCP or Specialist Appt  within 5-7 Days Complete  Home Care Screening Complete  Medication Review (RN CM) Complete  Some recent data might be hidden

## 2018-10-23 NOTE — Progress Notes (Signed)
Occupational Therapy Treatment Patient Details Name: Artavis Cowie MRN: [id number] DOB: [birth year] Today's Date: 10/23/2018    History of present illness 78 yo male admitted with dilantin toxicity. Hx of L hip fx s/p IM nail 2018, possible cognitive deficits, Sz, A flutter, CVA, anemia, falls.   OT comments  Pt today was able to perform SPT to [facility] with min A for balance and boot, peri care at min guard for lateral leans. Pt set up for grooming seated. Pt was also given pillbox cognitive assessment which looks at medicine management.  General Comments: Assessed using the Pill Box Test. Pt failed the assessment, demonstrating poor planning, mental flexibility, suboptimal search strategies, concrete thinking and the inability to multitask. Pt had a total of 42 errors, where more than 3 errors is considered a fail.  He was agitated in general about having to take a "test" and even when given extra instructions to assist him in task completion he did not fill a singe prescription correct for the week. At one point he just declined further participation.   Errors: One tablet 3x/day (yellow) - 14 errors - he put all 3 pills at the same time of day (It should be noted that this is after he was given extra instructions) One tablet 2x/day with breakfast and dinner (green) - 14 errors - 2 pills at BOTH breakfast AND dinner One tablet in the morning (Blue)- 3 errors he did this one every other day One tablet daily at bedtime (orange) - 7 errors (at this point the patient refused to participate further) One tablet every other day (red) - 4 errors (at this point the patient refused to participate further)   Total time to complete task (allowed 5 min) - greater than 30 min  It should also be noted that while I was in the room his neighbor, Manuela Schwartz called to check on him. She talked to me and told me that he has lost a significant amount of weight, that she has personally gone over at least 5-6 times and  picked him up when he has fallen outside she said "he can't take care of himself and he needs help"   Follow Up Recommendations  SNF    Equipment Recommendations  3 in 1 bedside commode(as shower chair)    Recommendations for Other Services Other (comment)(social work, community support- wants meals on wheels)    Precautions / Restrictions Precautions Precautions: Fall Restrictions Weight Bearing Restrictions: No       Mobility Bed Mobility Overal bed mobility: Needs Assistance Bed Mobility: Supine to Sit;Sit to Supine     Supine to sit: Supervision Sit to supine: Supervision   General bed mobility comments: increased time, HOB elevated, use of bed rails  Transfers Overall transfer level: Needs assistance Equipment used: 1 person hand held assist Transfers: Stand Pivot Transfers;Sit to/from Stand Sit to Stand: Min assist;From elevated surface Stand pivot transfers: Min assist;From elevated surface       General transfer comment: Assist to rise, steady. Cues for safety.    Balance Overall balance assessment: Needs assistance;History of Falls Sitting-balance support: No upper extremity supported;Feet supported Sitting balance-Leahy Scale: Good     Standing balance support: Single extremity supported;Bilateral upper extremity supported Standing balance-Leahy Scale: Poor Standing balance comment: very high risk of falls                           ADL either performed or assessed with clinical judgement  ADL Overall ADL's : Needs assistance/impaired     Grooming: Set up;Wash/dry face;Sitting Grooming Details (indicate cue type and reason): provided with warm wash cloth on [name]                 Toilet Transfer: Minimal assistance;Stand-pivot;BSC Toilet Transfer Details (indicate cue type and reason): cues for safety, assist for power up and balance Toileting- Clothing Manipulation and Hygiene: Min guard;Sitting/lateral lean       Functional  mobility during ADLs: Minimal assistance;+2 for safety/equipment       Vision       Perception     Praxis      Cognition Arousal/Alertness: Awake/alert Behavior During Therapy: WFL for tasks assessed/performed;Agitated Overall Cognitive Status: Impaired/Different from baseline Area of Impairment: Attention;Memory;Following commands;Awareness;Safety/judgement;Problem solving                   Current Attention Level: Sustained Memory: Decreased short-term memory Following Commands: Follows multi-step commands inconsistently Safety/Judgement: Decreased awareness of safety;Decreased awareness of deficits Awareness: Emergent Problem Solving: Difficulty sequencing;Requires verbal cues;Requires tactile cues General Comments: Pt given pill box cognitive assessment. Pt unable to pass or get any of the 5 pills correct. Serious concern about safety/awareness.         Exercises     Shoulder Instructions       General Comments      Pertinent Vitals/ Pain       Pain Assessment: No/denies pain  Home Living                                          Prior Functioning/Environment              Frequency  Min 2X/week        Progress Toward Goals  OT Goals(current goals can now be found in the care plan section)  Progress towards OT goals: Not progressing toward goals - comment(more unsteady today)  Acute Rehab OT Goals Patient Stated Goal: get a program like Meals on Wheels to assist OT Goal Formulation: With patient Time For Goal Achievement: 11/05/18 Potential to Achieve Goals: [place] Discharge plan remains appropriate;Frequency needs to be updated    Co-evaluation                 AM-PAC OT "6 Clicks" Daily Activity     Outcome Measure   Help from another person eating meals?: None Help from another person taking care of personal grooming?: A Lot Help from another person toileting, which includes using toliet, bedpan, or  urinal?: A Little Help from another person bathing (including washing, rinsing, drying)?: A Lot Help from another person to put on and taking off regular upper body clothing?: None Help from another person to put on and taking off regular lower body clothing?: A Lot 6 Click Score: 17    End of Session Equipment Utilized During Treatment: Gait belt  OT Visit Diagnosis: Unsteadiness on feet (R26.81);Other abnormalities of gait and mobility (R26.89);History of falling (Z91.81);Muscle weakness (generalized) (M62.81);Other symptoms and signs involving cognitive function;Adult, failure to thrive (R62.7)   Activity Tolerance Patient tolerated treatment well;Treatment limited secondary to agitation   Patient Left in bed;with call bell/phone within reach;with bed alarm set   Nurse Communication Mobility status        Time: 1525-1610 OT Time Calculation (min): 45 min  Charges: OT General Charges $OT Visit: 1 Visit  OT Treatments $Self Care/Home Management : 8-22 mins $Therapeutic Activity: 8-22 mins $Cognitive Funtion inital: Initial 15 mins  Hulda Humphrey OTR/L Acute Rehabilitation Services Pager: [phone] Office: [place] 10/23/2018, 4:19 PM

## 2018-10-23 NOTE — Care Management Important Message (Signed)
Important Message  Patient Details IM Letter given to Nancy Marus RN to present the Patient Name: [name] MRN: [id number] Date of Birth: 1941-01-19   Medicare Important Message Given:  Yes     Kerin Salen 10/23/2018, 1:09 PM

## 2018-10-23 NOTE — Progress Notes (Signed)
ANTICOAGULATION CONSULT NOTE   Pharmacy Consult for warfarin Indication: atrial fibrillation  No Known Allergies  Patient Measurements: Height: 6' (182.9 cm) Weight: 128 lb 12.8 oz (58.4 kg) IBW/kg (Calculated) : 77.6  Vital Signs: Temp: 98.6 F (37 C) (08/03 0454) Temp Source: Oral (08/03 0454) BP: 100/71 (08/03 0454) Pulse Rate: 77 (08/03 0454)  Labs: Recent Labs    10/21/18 0535 10/22/18 0548 10/23/18 0416  HGB 7.2* 7.6* 7.6*  HCT 24.4* 25.0* 25.5*  PLT 227 223 220  LABPROT 30.2* 24.2* 21.3*  INR 2.9* 2.2* 1.9*  CREATININE 0.56* 0.57*  --     Estimated Creatinine Clearance: 61.8 mL/min (A) (by C-G formula based on SCr of 0.57 mg/dL (L)).   Medical History: Past Medical History:  Diagnosis Date  . Hiatal hernia   . HTN (hypertension)    pt denies h/o HTN though it appears he was started on spironolactone during his hospitalization 3/13  . Pancreatitis   . Seizures ([place])   . Stroke ([place])   . Typical atrial flutter ([place]) 3/13    Scheduled:  . ferrous sulfate  325 mg Oral TID WC  . folic acid  1 mg Oral Daily  . levETIRAcetam  1,500 mg Oral BID  . pantoprazole  40 mg Oral Daily  . phenytoin  200 mg Oral QHS  . sodium chloride flush  3 mL Intravenous Q12H  . Warfarin - Pharmacist Dosing Inpatient   Does not apply q1800    Assessment: Patient takes warfarin PTA for atrial fibrillation. Pharmacy consulted to dose/monitor warfarin while inpatient.   PTA dose (confirmed with [name] clinic notes): -warfarin 5 mg PO Sun, Tues, Thurs -warfarin 2.5 mg PO all other days of the week Last dose PTA: 7/30  INR on admission = 3 is upper end of therapeutic range  Patient admitted with unintentional phenytoin toxicity (phenytoin level = 33.7 on admission). Drug interaction exists between phenytoin and warfarin - each can enhance the effects of the other medication.   Significant Events: -Warfarin held 7/31 -Phenytoin held since admission  Today, 10/23/18  INR  1.9, decreased, sl below therapeutic range  Phenytoin resumed 8/3 at 200mg  qhs. 8/3 phenytoin level =  16.8, corrected for Albumin 3.3 = 22.1  Hgb 7.6 - low, stable  Plt - WNL  No noted signs/symptoms of bleeding in chart  No other significant drug interactions with currently ordered medications  Goal of Therapy:  INR 2-3   Plan:   Warfarin 5 mg x1 at 1800  INR daily   Monitor for any signs/symptoms of bleeding or thrombosis  Minda Ditto PharmD Pager [phone] 10/23/2018, 11:06 AM

## 2018-10-24 ENCOUNTER — Other Ambulatory Visit: Payer: Self-pay | Admitting: Internal Medicine

## 2018-10-24 DIAGNOSIS — I959 Hypotension, unspecified: Secondary | ICD-10-CM | POA: Diagnosis not present

## 2018-10-24 DIAGNOSIS — R279 Unspecified lack of coordination: Secondary | ICD-10-CM | POA: Diagnosis not present

## 2018-10-24 DIAGNOSIS — Z8673 Personal history of transient ischemic attack (TIA), and cerebral infarction without residual deficits: Secondary | ICD-10-CM | POA: Diagnosis not present

## 2018-10-24 DIAGNOSIS — G40309 Generalized idiopathic epilepsy and epileptic syndromes, not intractable, without status epilepticus: Secondary | ICD-10-CM | POA: Diagnosis present

## 2018-10-24 DIAGNOSIS — R569 Unspecified convulsions: Secondary | ICD-10-CM | POA: Diagnosis present

## 2018-10-24 DIAGNOSIS — I4892 Unspecified atrial flutter: Secondary | ICD-10-CM | POA: Diagnosis not present

## 2018-10-24 DIAGNOSIS — I10 Essential (primary) hypertension: Secondary | ICD-10-CM | POA: Diagnosis not present

## 2018-10-24 DIAGNOSIS — D649 Anemia, unspecified: Secondary | ICD-10-CM | POA: Diagnosis not present

## 2018-10-24 DIAGNOSIS — T420X1A Poisoning by hydantoin derivatives, accidental (unintentional), initial encounter: Secondary | ICD-10-CM | POA: Diagnosis not present

## 2018-10-24 DIAGNOSIS — K7030 Alcoholic cirrhosis of liver without ascites: Secondary | ICD-10-CM | POA: Diagnosis present

## 2018-10-24 DIAGNOSIS — F0390 Unspecified dementia without behavioral disturbance: Secondary | ICD-10-CM | POA: Diagnosis not present

## 2018-10-24 DIAGNOSIS — R410 Disorientation, unspecified: Secondary | ICD-10-CM | POA: Diagnosis not present

## 2018-10-24 DIAGNOSIS — G40909 Epilepsy, unspecified, not intractable, without status epilepticus: Secondary | ICD-10-CM | POA: Diagnosis not present

## 2018-10-24 DIAGNOSIS — R402411 Glasgow coma scale score 13-15, in the field [EMT or ambulance]: Secondary | ICD-10-CM | POA: Diagnosis not present

## 2018-10-24 DIAGNOSIS — I480 Paroxysmal atrial fibrillation: Secondary | ICD-10-CM | POA: Diagnosis present

## 2018-10-24 DIAGNOSIS — Z743 Need for continuous supervision: Secondary | ICD-10-CM | POA: Diagnosis not present

## 2018-10-24 LAB — PROTIME-INR
INR: 1.9 — ABNORMAL HIGH (ref 0.8–1.2)
Prothrombin Time: 21.3 seconds — ABNORMAL HIGH (ref 11.4–15.2)

## 2018-10-24 LAB — HEMOGLOBIN AND HEMATOCRIT, BLOOD
HCT: 25.7 % — ABNORMAL LOW (ref 39.0–52.0)
Hemoglobin: 7.7 g/dL — ABNORMAL LOW (ref 13.0–17.0)

## 2018-10-24 LAB — NOVEL CORONAVIRUS, NAA (HOSP ORDER, SEND-OUT TO REF LAB; TAT 18-24 HRS): SARS-CoV-2, NAA: NOT DETECTED

## 2018-10-24 LAB — GLUCOSE, CAPILLARY: Glucose-Capillary: 82 mg/dL (ref 70–99)

## 2018-10-24 MED ORDER — WARFARIN SODIUM 5 MG PO TABS
5.0000 mg | ORAL_TABLET | Freq: Once | ORAL | Status: DC
  Filled 2018-10-24: qty 1

## 2018-10-24 MED ORDER — FOLIC ACID 1 MG PO TABS: 1.0000 mg | ORAL_TABLET | Freq: Every day | ORAL | 1 refills | Status: AC

## 2018-10-24 MED ORDER — PHENYTOIN SODIUM EXTENDED 200 MG PO CAPS: 200.0000 mg | ORAL_CAPSULE | Freq: Every day | ORAL | 1 refills | Status: DC

## 2018-10-24 MED ORDER — POLYETHYLENE GLYCOL 3350 17 G PO PACK: 17.0000 g | PACK | Freq: Every day | ORAL | 0 refills | Status: AC | PRN

## 2018-10-24 MED ORDER — WARFARIN SODIUM 5 MG PO TABS: 5.0000 mg | ORAL_TABLET | Freq: Once | ORAL | 0 refills | Status: DC

## 2018-10-24 NOTE — Progress Notes (Signed)
Spoke with patient and daughter this am, assisting with questions concerning discharge to Rehab facility. Pt appear to unable to understand simple concept considering  The discharge process. Pt is indecisive and is ambiguus about going to a facility. I explained his activity level to the patient and daughter, daughter was not knowledgeable about his current activity level. Pt is requiring 1 assist to the bedside commode and 2 assist when ambulating to the bathroom. Pt is shaky and unsteady when ambulating and is unsafe and would be at a FALL RISK if pt returns home alone. Care management updated and spoke with the patient. Pt has unclear thinking at times and requiring one to explain the processes more than once. Daughter agreed patient may need help at home because she lives out of town and the patient live alone here in town [place]). Care management involved and will follow up with daughter since the patient has granted Care Mangagement permission to discuss his care with his daughter.  Pt and daughter has list of facility given by Care management and is waiting for the final decision as which facilty pt decides to transfer to.  SRP, RN

## 2018-10-24 NOTE — Progress Notes (Signed)
Report called to [facility] [phone] RM 115. Pt prepared for discharge. Waiting for EMS arrival. SRP, RN

## 2018-10-24 NOTE — Progress Notes (Signed)
[place] for warfarin Indication: atrial fibrillation  No Known Allergies  Patient Measurements: Height: 6' (182.9 cm) Weight: 128 lb 12.8 oz (58.4 kg) IBW/kg (Calculated) : 77.6  Vital Signs: Temp: 98.6 F (37 C) (08/04 0605) Temp Source: Oral (08/04 0605) BP: 103/63 (08/04 0605) Pulse Rate: 74 (08/04 0605)  Labs: Recent Labs    10/22/18 0548 10/23/18 0416 10/24/18 0352  HGB 7.6* 7.6* 7.7*  HCT 25.0* 25.5* 25.7*  PLT 223 220  --   LABPROT 24.2* 21.3* 21.3*  INR 2.2* 1.9* 1.9*  CREATININE 0.57*  --   --    Estimated Creatinine Clearance: 61.8 mL/min (A) (by C-G formula based on SCr of 0.57 mg/dL (L)).  Medical History: Past Medical History:  Diagnosis Date  . Hiatal hernia   . HTN (hypertension)    pt denies h/o HTN though it appears he was started on spironolactone during his hospitalization 3/13  . Pancreatitis   . Seizures ([place])   . Stroke ([place])   . Typical atrial flutter ([place]) 3/13   Scheduled:  . ferrous sulfate  325 mg Oral TID WC  . folic acid  1 mg Oral Daily  . levETIRAcetam  1,500 mg Oral BID  . pantoprazole  40 mg Oral Daily  . phenytoin  200 mg Oral QHS  . sodium chloride flush  3 mL Intravenous Q12H  . Warfarin - Pharmacist Dosing Inpatient   Does not apply q1800   Assessment: Patient takes warfarin PTA for atrial fibrillation. Pharmacy consulted to dose/monitor warfarin while inpatient.   PTA dose (confirmed with [facility] clinic notes): -warfarin 5 mg PO Sun, Tues, Thurs -warfarin 2.5 mg PO all other days of the week Last dose PTA: 7/30  INR on admission = 3, upper end of therapeutic range  Patient admitted with unintentional phenytoin toxicity (phenytoin level = 33.7 on admission).  Drug interaction exists between phenytoin and warfarin - each can enhance the effects of the other medication.   Significant Events: -Warfarin held 7/31 -Phenytoin held since admission  Today, 10/24/18  INR 1.9,  unchanged, sl below therapeutic range  Phenytoin resumed 8/3 at 200mg  qhs. 8/3 phenytoin level =  16.8, corrected for Albumin 3.3 = 22.1  Hgb 7.7 - low, stable  Plt - WNL  No noted signs/symptoms of bleeding in chart  No other significant drug interactions with currently ordered medications  Goal of Therapy:  INR 2-3   Plan:   Warfarin 5 mg x1 at 1800  INR daily   Monitor for any signs/symptoms of bleeding or thrombosis  Minda Ditto PharmD Pager [phone] 10/24/2018, 7:19 AM

## 2018-10-24 NOTE — Discharge Summary (Addendum)
Physician Discharge Summary  [name] Space LDJ:[id number] DOB: February 29, 1941 DOA: 10/19/2018  PCP: [name], MD  Admit date: 10/19/2018 Discharge date: 10/24/2018  Admitted From: Home Disposition: SNF  Recommendations for Outpatient Follow-up:  1. Follow up with PCP and neurology in 1week 2. Please obtain CBC/BMP/INR/Dilantin level in 1 week 3. May adjust Dilantin dose based on Dilantin level in 1 week 4. Check INR in 3 to 4 days and adjust warfarin dose as appropriate. 5. Please follow up on the following pending results: None  Home Health: Not applicable Equipment/Devices: Not applicable  Discharge Condition: Stable CODE STATUS: Full code  Hospital Course: 78 year old male with history of dementia, paroxysmal A. fib on Coumadin, history of CVA, seizure disorder, HTN and anemia presenting with nonspecific malaise and admitted for Dilantin toxicity likely from accidental/unintentional overdose.  Patient lives by himself.  Has history of dementia. Dilantin level elevated to 36.4.  Poison control consulted and recommended hospital admission with IV fluids and Dilantin check every 4 hours.  Dilantin level dropped to 16.  Patient symptoms improved.  Neurology consulted over the phone and recommended restarting Dilantin ER at 200 mg nightly and rechecking level in 1 week which will be 10/30/2018. Patient was evaluated by PT/OT who recommended SNF placement.  Initially he refused SNF although he finally agreed.  Patient's daughter frequently updated about patient's care during his hospitalization.  See individual problems below for more.  Discharge Diagnoses:  Dilantin toxicity: Level dropped to within normal range.  Mental status improved.  Limited insight into his condition.  -Restarted Dilantin ER at 200 mg nightly on 10/23/2018. -PT recommended SNF/HH PT but patient initially declined but finally agreeable.  History of seizure: On Dilantin and Keppra at home. -Resumed  Dilantin ER at 200 mg nightly on 10/23/2018 after discussion with neurology, Dr. Leonel Ramsay.  Recheck level in 1 week, on 10/30/2018 and will adjust dose as appropriate. -Continue Keppra 1500 mg twice daily -Recommend follow-up with his neurologist in 1 week.  Paroxysmal atrial fibrillation: Chads 2 vasc score 4.  INR 1.9.  Has been getting warfarin 5 mg daily. -Discharge go warfarin 5 mg daily -Check INR in 3 to 4 days and adjust dose as appropriate.  History of CVA: Stable.  No focal neuro deficit. -Anticoagulation as above  Normocytic anemia: Baseline Hgb 7-8.  Anemia panel shows low folate -Discharged on p.o. iron and folic acid. -Recheck CBC at follow-up.  Hypomagnesemia: Resolved.  History of dementia without behavioral disturbance: -Frequent reorientation and delirium precautions  Discharge Instructions  Discharge Instructions    Diet general   Complete by: As directed    Increase activity slowly   Complete by: As directed      Allergies as of 10/24/2018   No Known Allergies     Medication List    TAKE these medications   ferrous sulfate 325 (65 FE) MG tablet Take 1 tablet (325 mg total) by mouth 3 (three) times daily with meals.   folic acid 1 MG tablet Commonly known as: FOLVITE Take 1 tablet (1 mg total) by mouth daily. Start taking on: October 25, 2018   levETIRAcetam 500 MG tablet Commonly known as: KEPPRA TAKE 3 TABLETS BY MOUTH TWICE A DAY What changed:   how much to take  how to take this  when to take this  additional instructions   pantoprazole 40 MG tablet Commonly known as: PROTONIX Take 1 tablet (40 mg total) by mouth daily for 30 days.   phenytoin 200 MG  ER capsule Commonly known as: DILANTIN Take 1 capsule (200 mg total) by mouth at bedtime. What changed:   medication strength  how much to take  when to take this   polyethylene glycol 17 g packet Commonly known as: MIRALAX / GLYCOLAX Take 17 g by mouth daily as needed for  mild constipation.   warfarin 5 MG tablet Commonly known as: COUMADIN Take as directed. If you are unsure how to take this medication, talk to your nurse or doctor. Original instructions: Take 1 tablet (5 mg total) by mouth one time only at 6 PM for 30 doses. Check INR in 3 to 4 days and adjust dose as appropriate. What changed:   medication strength  See the new instructions.       Contact information for follow-up providers    [place], Christiane Ha, MD. Schedule an appointment as soon as possible for a visit in 1 week(s).   Specialty: Internal Medicine Contact information: [address] [place] [address] [phone]            Contact information for after-discharge care    Destination    [name] .   Service: Skilled Nursing Contact information: [place]                  Consultations:  Poison control  Neurology over the phone  Procedures/Studies:  2D Echo: None  No results found.   Subjective: No major events overnight of this morning.  Has no complaints.  Refuses SNF but has poor comprehension and insight into his condition.  Does not recall our discussion about rehab from yesterday.  He otherwise denies headache, vision change, chest pain, shortness of breath, GI or GU symptoms.   Discharge Exam: Vitals:   10/24/18 0605 10/24/18 1440  BP: 103/63 (!) 124/58  Pulse: 74 82  Resp: 18 19  Temp: 98.6 F (37 C) 98.4 F (36.9 C)  SpO2: 95% 100%    GENERAL: No acute distress.  Appears well.  HEENT: MMM.  Vision and hearing grossly intact.  NECK: Supple.  No JVD.  LUNGS:  No IWOB. Good air movement bilaterally. HEART:  RRR. Heart sounds normal.  ABD: Bowel sounds present. Soft. Non tender.  MSK/EXT:  Moves all extremities. No apparent deformity. No edema bilaterally. SKIN: no apparent skin lesion or wound NEURO: Awake, alert and oriented x4- date  and year.  No gross neuro deficit. PSYCH: Calm. Normal affect.     The results of significant diagnostics from this hospitalization (including imaging, microbiology, ancillary and laboratory) are listed below for reference.     Microbiology: Recent Results (from the past 240 hour(s))  SARS Coronavirus 2 (CEPHEID - Performed in [place] hospital lab), Hosp Order     Status: None   Collection Time: 10/20/18  2:30 AM   Specimen: Nasopharyngeal Swab  Result Value Ref Range Status   SARS Coronavirus 2 NEGATIVE NEGATIVE Final    Comment: (NOTE) If result is NEGATIVE SARS-CoV-2 target nucleic acids are NOT DETECTED. The SARS-CoV-2 RNA is generally detectable in upper and lower  respiratory specimens during the acute phase of infection. The lowest  concentration of SARS-CoV-2 viral copies this assay can detect is 250  copies / mL. A negative result does not preclude SARS-CoV-2 infection  and should not be used as the sole basis for treatment or other  patient management decisions.  A negative result may occur with  improper specimen  collection / handling, submission of specimen other  than nasopharyngeal swab, presence of viral mutation(s) within the  areas targeted by this assay, and inadequate number of viral copies  (<250 copies / mL). A negative result must be combined with clinical  observations, patient history, and epidemiological information. If result is POSITIVE SARS-CoV-2 target nucleic acids are DETECTED. The SARS-CoV-2 RNA is generally detectable in upper and lower  respiratory specimens dur ing the acute phase of infection.  Positive  results are indicative of active infection with SARS-CoV-2.  Clinical  correlation with patient history and other diagnostic information is  necessary to determine patient infection status.  Positive results do  not rule out bacterial infection or co-infection with other viruses. If result is PRESUMPTIVE POSTIVE SARS-CoV-2 nucleic acids  MAY BE PRESENT.   A presumptive positive result was obtained on the submitted specimen  and confirmed on repeat testing.  While 2019 novel coronavirus  (SARS-CoV-2) nucleic acids may be present in the submitted sample  additional confirmatory testing may be necessary for epidemiological  and / or clinical management purposes  to differentiate between  SARS-CoV-2 and other Sarbecovirus currently known to infect humans.  If clinically indicated additional testing with an alternate test  methodology [phone]) is advised. The SARS-CoV-2 RNA is generally  detectable in upper and lower respiratory sp ecimens during the acute  phase of infection. The expected result is Negative. Fact Sheet for Patients:  StrictlyIdeas.no Fact Sheet for Healthcare Providers: BankingDealers.co.za This test is not yet approved or cleared by the [place] FDA and has been authorized for detection and/or diagnosis of SARS-CoV-2 by FDA under an Emergency Use Authorization (EUA).  This EUA will remain in effect (meaning this test can be used) for the duration of the COVID-19 declaration under Section 564(b)(1) of the Act, 21 U.S.C. section 360bbb-3(b)(1), unless the authorization is terminated or revoked sooner. Performed at [facility], [place] [address] [place], [place] [address]   Novel Coronavirus, NAA (hospital order; send-out to ref lab)     Status: None   Collection Time: 10/23/18 10:41 AM   Specimen: Nasopharyngeal Swab; Respiratory  Result Value Ref Range Status   SARS-CoV-2, NAA NOT DETECTED NOT DETECTED Final    Comment: (NOTE) This test was developed and its performance characteristics determined by [employer]. This test has not been FDA cleared or approved. This test has been authorized by FDA under an Emergency Use Authorization (EUA). This test is only authorized for the duration of time the declaration that  circumstances exist justifying the authorization of the emergency use of in vitro diagnostic tests for detection of SARS-CoV-2 virus and/or diagnosis of COVID-19 infection under section 564(b)(1) of the Act, 21 U.S.C. 094BSJ-6(G)(8), unless the authorization is terminated or revoked sooner. When diagnostic testing is negative, the possibility of a false negative result should be considered in the context of a patient's recent exposures and the presence of clinical signs and symptoms consistent with COVID-19. An individual without symptoms of COVID-19 and who is not shedding SARS-CoV-2 virus would expect to have a negative (not detected) result in this assay. Performed  At: [name] [place], [place] [id number] Rush Farmer MD YY:[id number]    [place]  Final    Comment: Performed at [facility] [address], [place], [place] [address]     Labs: BNP (last 3 results) No results for input(s): BNP in the last 8760 hours. Basic Metabolic Panel: Recent Labs  Lab  10/19/18 2237 10/20/18 0410 10/21/18 0535 10/22/18 0548  NA 141 138 139 139  K 3.8 3.6 3.6 4.2  CL 102 102 103 104  CO2 31 30 29 28   GLUCOSE 89 84 78 83  BUN 13 12 13 13   CREATININE 0.63 0.51* 0.56* 0.57*  CALCIUM 8.7* 8.5* 8.0* 8.0*  MG  --   --  1.4* 1.8   Liver Function Tests: Recent Labs  Lab 10/19/18 2237 10/21/18 0535  AST 17 12*  ALT 12 10  ALKPHOS 79 68  BILITOT 0.6 0.6  PROT 6.6 5.5*  ALBUMIN 4.1 3.3*   No results for input(s): LIPASE, AMYLASE in the last 168 hours. No results for input(s): AMMONIA in the last 168 hours. CBC: Recent Labs  Lab 10/19/18 2237 10/20/18 0410 10/21/18 0535 10/22/18 0548 10/23/18 0416 10/24/18 0352  WBC 5.3 4.9 4.7 5.2 4.3  --   NEUTROABS 3.1  --  2.5  --   --   --   HGB 8.3* 7.6* 7.2* 7.6* 7.6* 7.7*  HCT 27.2* 25.8* 24.4* 25.0* 25.5* 25.7*  MCV 90.4 91.5 90.7 89.9 92.1  --   PLT 242 242  227 223 220  --    Cardiac Enzymes: No results for input(s): CKTOTAL, CKMB, CKMBINDEX, TROPONINI in the last 168 hours. BNP: Invalid input(s): POCBNP CBG: Recent Labs  Lab 10/20/18 0807 10/21/18 0818 10/22/18 0751 10/24/18 0752  GLUCAP 86 74 80 82   D-Dimer No results for input(s): DDIMER in the last 72 hours. Hgb A1c No results for input(s): HGBA1C in the last 72 hours. Lipid Profile No results for input(s): CHOL, HDL, LDLCALC, TRIG, CHOLHDL, LDLDIRECT in the last 72 hours. Thyroid function studies No results for input(s): TSH, T4TOTAL, T3FREE, THYROIDAB in the last 72 hours.  Invalid input(s): FREET3 Anemia work up No results for input(s): VITAMINB12, FOLATE, FERRITIN, TIBC, IRON, RETICCTPCT in the last 72 hours. Urinalysis    Component Value Date/Time   COLORURINE YELLOW 10/20/2018 0120   APPEARANCEUR CLEAR 10/20/2018 0120   LABSPEC 1.018 10/20/2018 0120   PHURINE 5.0 10/20/2018 0120   GLUCOSEU NEGATIVE 10/20/2018 0120   HGBUR MODERATE (A) 10/20/2018 0120   BILIRUBINUR NEGATIVE 10/20/2018 0120   KETONESUR NEGATIVE 10/20/2018 0120   PROTEINUR NEGATIVE 10/20/2018 0120   UROBILINOGEN 0.2 06/02/2011 0300   NITRITE NEGATIVE 10/20/2018 0120   LEUKOCYTESUR NEGATIVE 10/20/2018 0120   Sepsis Labs Invalid input(s): PROCALCITONIN,  WBC,  LACTICIDVEN   Time coordinating discharge: 35 minutes  SIGNED:  Mercy Riding, MD  [facility] 10/24/2018, 2:49 PM  If 7PM-7AM, please contact night-coverage [email or web address] Password [email or web address]

## 2018-10-24 NOTE — Plan of Care (Signed)
  Problem: Education: Goal: Knowledge of General Education information will improve Description Including pain rating scale, medication(s)/side effects and non-pharmacologic comfort measures Outcome: Progressing   Problem: Health Behavior/Discharge Planning: Goal: Ability to manage health-related needs will improve Outcome: Progressing   

## 2018-10-24 NOTE — TOC Progression Note (Signed)
Transition of Care [facility]) - Progression Note    Patient Details  Name: [name] MRN: [id number] Date of Birth: 10/01/1940  Transition of Care [name]) CM/SW Contact  Brent Courts, RN Phone Number: 10/24/2018, 2:56 PM  Clinical Narrative: CM spoke with patient at bedside this AM to present bed offers. Initially patient states he is not going to a SNF or getting [place] and will go home. CM then asked to return and present bed offers again by bedside RN who states patient misunderstood and wants to discuss SNF.  CM presented bed offers to patient, asked if patient wanted CM to speak with his daughter as well. Patient states will review offers but declines CM speaking with daughter.   CM received follow-up call from bedside RN stating patient now wishes for CM to present bed offers to his daughter.  CM and bedside RN spoke with patient together and confirmed patient wished for CM to speak with his daughter.  CM spoke with daughter, [name], over the telephone and provided offers.  Daughter [name] called CM back and stated she spoke with her father and they want to accepts beds at [name] or [name].  CM received call from bedside RN who stated patient selects [name].  CM spoke with patient at bedside again to confirm selection. Patient confirms he wishes to go to [name].  CM asked if patient was aware that his daughter had reached out to CM with two different selctions. Patient stated it did not matter because the choice was his and he selects [name].  Bed availability confirmed, DC summary sent to facility. [facility] transport arranged.      Expected Discharge Plan: [place] Barriers to Discharge: No Barriers Identified  Expected Discharge Plan and Services Expected Discharge Plan: [place]   Discharge Planning Services: CM Consult   Living arrangements for the past 2 months: Single Family Home Expected Discharge Date: 10/24/18                DME Arranged: N/A DME Agency: NA       HH Arranged: NA HH Agency: NA         Social Determinants of Health (SDOH) Interventions    Readmission Risk Interventions Readmission Risk Prevention Plan 07/25/2018  Transportation Screening Complete  PCP or Specialist Appt within 5-7 Days Complete  Home Care Screening Complete  Medication Review (RN CM) Complete  Some recent data might be hidden

## 2018-10-24 NOTE — Progress Notes (Signed)
PROGRESS NOTE  [name] [name] JHE:[id number] DOB: [birth year]   PCP: [name], MD  Patient is from: Home  DOA: 10/19/2018 LOS: 4  Brief Narrative / Interim history: 78 year old male with PAF on Coumadin, history of CVA, seizure disorder, HTN and anemia presenting with nonspecific malaise and admitted for Dilantin toxicity likely from accidental/unintentional overdose. Dilantin level elevated to 36.4.  Poison control consulted and recommended hospital admission with IV fluids and Dilantin check every 4 hours.  Dilantin level dropped to 16.  Patient symptoms improved. Restarted Dilantin at 200 mg nightly per neurology recommendation.  Needs Dilantin level checked on 10/30/2018.  Patient was evaluated by PT/OT who recommended SNF placement.  Initially he refused SNF although he finally agreed.  Subjective: No major events overnight of this morning.  Patient has no complaints this morning.  He says he would like to go home.  Refuses SNF but has poor comprehension and insight into his condition.  Does not recall our discussion about rehab from yesterday.  He otherwise denies headache, vision change, chest pain, shortness of breath, abdominal pain or GU symptoms.  Objective: Vitals:   10/23/18 1337 10/23/18 2152 10/24/18 0506 10/24/18 0605  BP: 108/69 109/64 111/64 103/63  Pulse: 72 79 66 74  Resp: 20 20  18   Temp: 99.1 F (37.3 C) 98.8 F (37.1 C) 98.2 F (36.8 C) 98.6 F (37 C)  TempSrc: Oral Oral Oral Oral  SpO2: 97% 99% 100% 95%  Weight:      Height:        Intake/Output Summary (Last 24 hours) at 10/24/2018 1329 Last data filed at 10/24/2018 0703 Gross per 24 hour  Intake --  Output 950 ml  Net -950 ml   Filed Weights   10/20/18 0356  Weight: 58.4 kg    Examination:  GENERAL: No acute distress.  Appears well.  HEENT: MMM.  Vision and hearing grossly intact.  NECK: Supple.  No apparent JVD.  RESP:  No IWOB. Good air movement bilaterally. CVS:  RRR. Heart sounds  normal.  ABD/GI/GU: Bowel sounds present. Soft. Non tender.  MSK/EXT:  Moves extremities. No apparent deformity or edema.  SKIN: no apparent skin lesion or wound NEURO: Awake, alert and oriented x4- date and year.  Very little insight into his condition.  No focal neuro deficits. PSYCH: Calm. Normal affect.   I have personally reviewed the following labs and images:  Radiology Studies: No results found.  Microbiology: Recent Results (from the past 240 hour(s))  SARS Coronavirus 2 (CEPHEID - Performed in [place] hospital lab), Hosp Order     Status: None   Collection Time: 10/20/18  2:30 AM   Specimen: Nasopharyngeal Swab  Result Value Ref Range Status   SARS Coronavirus 2 NEGATIVE NEGATIVE Final    Comment: (NOTE) If result is NEGATIVE SARS-CoV-2 target nucleic acids are NOT DETECTED. The SARS-CoV-2 RNA is generally detectable in upper and lower  respiratory specimens during the acute phase of infection. The lowest  concentration of SARS-CoV-2 viral copies this assay can detect is 250  copies / mL. A negative result does not preclude SARS-CoV-2 infection  and should not be used as the sole basis for treatment or other  patient management decisions.  A negative result may occur with  improper specimen collection / handling, submission of specimen other  than nasopharyngeal swab, presence of viral mutation(s) within the  areas targeted by this assay, and inadequate number of viral copies  (<250 copies / mL). A negative result  must be combined with clinical  observations, patient history, and epidemiological information. If result is POSITIVE SARS-CoV-2 target nucleic acids are DETECTED. The SARS-CoV-2 RNA is generally detectable in upper and lower  respiratory specimens dur ing the acute phase of infection.  Positive  results are indicative of active infection with SARS-CoV-2.  Clinical  correlation with patient history and other diagnostic information is  necessary to  determine patient infection status.  Positive results do  not rule out bacterial infection or co-infection with other viruses. If result is PRESUMPTIVE POSTIVE SARS-CoV-2 nucleic acids MAY BE PRESENT.   A presumptive positive result was obtained on the submitted specimen  and confirmed on repeat testing.  While 2019 novel coronavirus  (SARS-CoV-2) nucleic acids may be present in the submitted sample  additional confirmatory testing may be necessary for epidemiological  and / or clinical management purposes  to differentiate between  SARS-CoV-2 and other Sarbecovirus currently known to infect humans.  If clinically indicated additional testing with an alternate test  methodology [phone]) is advised. The SARS-CoV-2 RNA is generally  detectable in upper and lower respiratory sp ecimens during the acute  phase of infection. The expected result is Negative. Fact Sheet for Patients:  StrictlyIdeas.no Fact Sheet for Healthcare Providers: BankingDealers.co.za This test is not yet approved or cleared by the [place] FDA and has been authorized for detection and/or diagnosis of SARS-CoV-2 by FDA under an Emergency Use Authorization (EUA).  This EUA will remain in effect (meaning this test can be used) for the duration of the COVID-19 declaration under Section 564(b)(1) of the Act, 21 U.S.C. section 360bbb-3(b)(1), unless the authorization is terminated or revoked sooner. Performed at [facility], [place] [address] [place], [place] [address]   Novel Coronavirus, NAA (hospital order; send-out to ref lab)     Status: None   Collection Time: 10/23/18 10:41 AM   Specimen: Nasopharyngeal Swab; Respiratory  Result Value Ref Range Status   SARS-CoV-2, NAA NOT DETECTED NOT DETECTED Final    Comment: (NOTE) This test was developed and its performance characteristics determined by [employer]. This test has not been  FDA cleared or approved. This test has been authorized by FDA under an Emergency Use Authorization (EUA). This test is only authorized for the duration of time the declaration that circumstances exist justifying the authorization of the emergency use of in vitro diagnostic tests for detection of SARS-CoV-2 virus and/or diagnosis of COVID-19 infection under section 564(b)(1) of the Act, 21 U.S.C. 997FSF-4(E)(3), unless the authorization is terminated or revoked sooner. When diagnostic testing is negative, the possibility of a false negative result should be considered in the context of a patient's recent exposures and the presence of clinical signs and symptoms consistent with COVID-19. An individual without symptoms of COVID-19 and who is not shedding SARS-CoV-2 virus would expect to have a negative (not detected) result in this assay. Performed  At: [facility] [place], [place] [id number] Rush Farmer MD DH:[id number]    [place]  Final    Comment: Performed at [place] [address], [place], [place] [address]    Sepsis Labs: Invalid input(s): PROCALCITONIN, LACTICIDVEN  Urine analysis:    Component Value Date/Time   COLORURINE YELLOW 10/20/2018 0120   APPEARANCEUR CLEAR 10/20/2018 0120   LABSPEC 1.018 10/20/2018 0120   PHURINE 5.0 10/20/2018 0120   GLUCOSEU NEGATIVE 10/20/2018 0120   HGBUR MODERATE (A) 10/20/2018 0120   BILIRUBINUR NEGATIVE 10/20/2018 0120  KETONESUR NEGATIVE 10/20/2018 0120   PROTEINUR NEGATIVE 10/20/2018 0120   UROBILINOGEN 0.2 06/02/2011 0300   NITRITE NEGATIVE 10/20/2018 0120   LEUKOCYTESUR NEGATIVE 10/20/2018 0120    Anemia Panel: No results for input(s): VITAMINB12, FOLATE, FERRITIN, TIBC, IRON, RETICCTPCT in the last 72 hours.  Thyroid Function Tests: No results for input(s): TSH, T4TOTAL, FREET4, T3FREE, THYROIDAB in the last 72 hours.  Lipid Profile: No results  for input(s): CHOL, HDL, LDLCALC, TRIG, CHOLHDL, LDLDIRECT in the last 72 hours.  CBG: Recent Labs  Lab 10/20/18 0807 10/21/18 0818 10/22/18 0751 10/24/18 0752  GLUCAP 86 74 80 82    HbA1C: No results for input(s): HGBA1C in the last 72 hours.  BNP (last 3 results): No results for input(s): PROBNP in the last 8760 hours.  Cardiac Enzymes: No results for input(s): CKTOTAL, CKMB, CKMBINDEX, TROPONINI in the last 168 hours.  Coagulation Profile: Recent Labs  Lab 10/20/18 0017 10/21/18 0535 10/22/18 0548 10/23/18 0416 10/24/18 0352  INR 3.0* 2.9* 2.2* 1.9* 1.9*    Liver Function Tests: Recent Labs  Lab 10/19/18 2237 10/21/18 0535  AST 17 12*  ALT 12 10  ALKPHOS 79 68  BILITOT 0.6 0.6  PROT 6.6 5.5*  ALBUMIN 4.1 3.3*   No results for input(s): LIPASE, AMYLASE in the last 168 hours. No results for input(s): AMMONIA in the last 168 hours.  Basic Metabolic Panel: Recent Labs  Lab 10/19/18 2237 10/20/18 0410 10/21/18 0535 10/22/18 0548  NA 141 138 139 139  K 3.8 3.6 3.6 4.2  CL 102 102 103 104  CO2 31 30 29 28   GLUCOSE 89 84 78 83  BUN 13 12 13 13   CREATININE 0.63 0.51* 0.56* 0.57*  CALCIUM 8.7* 8.5* 8.0* 8.0*  MG  --   --  1.4* 1.8   GFR: Estimated Creatinine Clearance: 61.8 mL/min (A) (by C-G formula based on SCr of 0.57 mg/dL (L)).  CBC: Recent Labs  Lab 10/19/18 2237 10/20/18 0410 10/21/18 0535 10/22/18 0548 10/23/18 0416 10/24/18 0352  WBC 5.3 4.9 4.7 5.2 4.3  --   NEUTROABS 3.1  --  2.5  --   --   --   HGB 8.3* 7.6* 7.2* 7.6* 7.6* 7.7*  HCT 27.2* 25.8* 24.4* 25.0* 25.5* 25.7*  MCV 90.4 91.5 90.7 89.9 92.1  --   PLT 242 242 227 223 220  --     Procedures:  None  Microbiology summarized: COVID-19 negative.  Assessment & Plan: Dilantin toxicity: Level within normal range this morning.  Mental status improved.  Limited insight into his condition.  -Restarted Dilantin ER at 200 mg nightly on 10/23/2018. -PT recommended SNF/HH PT but  patient initially declined but finally agreeable. -CSW consulted for placement.  History of seizure: On Dilantin and Keppra at home. -Resumed Dilantin ER at 200 mg nightly on 10/23/2018 after discussion with neurology, Dr. Leonel Ramsay  -Neurology recommends rechecking level in 1 week which will be 10/30/2018. -Continue Keppra 1500 mg twice daily  Paroxysmal atrial fibrillation: Chads 2 vasc score 4. -Continue warfarin per pharmacy  History of CVA: Stable.  No focal neuro deficit. -Anticoagulation as above  Normocytic anemia: Baseline Hgb 7-8.  Anemia panel shows low folate -Continue home iron.  Add folic acid. -Intermittently check H&H.  Hypomagnesemia: Resolved.  History of dementia without behavioral disturbance: -Frequent reorientation and delirium precautions  DVT prophylaxis: On warfarin Code Status: Full code Family Communication: Updated patient's daughter over the phone.  Disposition Plan: Remains inpatient pending SNF placement.  COVID-19  test pending. Consultants: Poison control, neurology (over the phone)   Antimicrobials: Anti-infectives (From admission, onward)   None      Sch Meds:  Scheduled Meds:  ferrous sulfate  325 mg Oral TID WC   folic acid  1 mg Oral Daily   levETIRAcetam  1,500 mg Oral BID   pantoprazole  40 mg Oral Daily   phenytoin  200 mg Oral QHS   sodium chloride flush  3 mL Intravenous Q12H   warfarin  5 mg Oral ONCE-1800   Warfarin - Pharmacist Dosing Inpatient   Does not apply q1800   Continuous Infusions:  PRN Meds:.acetaminophen **OR** acetaminophen, ondansetron **OR** ondansetron (ZOFRAN) IV, polyethylene glycol   Javi Bollman T. [place]  If 7PM-7AM, please contact night-coverage [email or web address] Password [email or web address] 10/24/2018, 1:29 PM

## 2018-10-27 ENCOUNTER — Encounter (HOSPITAL_COMMUNITY): Payer: Medicare Other

## 2018-11-08 ENCOUNTER — Ambulatory Visit (INDEPENDENT_AMBULATORY_CARE_PROVIDER_SITE_OTHER): Payer: Medicare Other | Admitting: *Deleted

## 2018-11-08 ENCOUNTER — Other Ambulatory Visit: Payer: Self-pay

## 2018-11-08 DIAGNOSIS — Z5181 Encounter for therapeutic drug level monitoring: Secondary | ICD-10-CM | POA: Diagnosis not present

## 2018-11-08 DIAGNOSIS — I4892 Unspecified atrial flutter: Secondary | ICD-10-CM | POA: Diagnosis not present

## 2018-11-08 LAB — POCT INR: INR: 2 (ref 2.0–3.0)

## 2018-11-08 NOTE — Patient Instructions (Signed)
Description   Continue taking Coumadin 2 tablets daily except 1 tablet on Mondays, Wednesdays, Fridays, and Saturdays. Recheck INR in 3 weeks. Prednisone dose continued to 10mg  daily except 15mg  on Mondays, Wednesdays and Fridays, call us with this dose changes. [phone]

## 2018-11-22 ENCOUNTER — Other Ambulatory Visit: Payer: Self-pay | Admitting: Internal Medicine

## 2018-11-29 ENCOUNTER — Ambulatory Visit (INDEPENDENT_AMBULATORY_CARE_PROVIDER_SITE_OTHER): Payer: Medicare Other

## 2018-11-29 ENCOUNTER — Other Ambulatory Visit: Payer: Self-pay

## 2018-11-29 DIAGNOSIS — Z5181 Encounter for therapeutic drug level monitoring: Secondary | ICD-10-CM | POA: Diagnosis not present

## 2018-11-29 DIAGNOSIS — I4892 Unspecified atrial flutter: Secondary | ICD-10-CM

## 2018-11-29 LAB — POCT INR: INR: 2.1 (ref 2.0–3.0)

## 2018-11-29 NOTE — Patient Instructions (Signed)
Description   Continue taking Coumadin 2 tablets daily except 1 tablet on Mondays, Wednesdays, Fridays, and Saturdays. Recheck INR in 4 weeks. Prednisone dose continued to 10mg  daily except 15mg  on Mondays, Wednesdays and Fridays, call us with this dose changes. [phone]

## 2018-12-17 ENCOUNTER — Other Ambulatory Visit: Payer: Self-pay | Admitting: Internal Medicine

## 2018-12-27 ENCOUNTER — Other Ambulatory Visit: Payer: Self-pay

## 2018-12-27 ENCOUNTER — Ambulatory Visit (INDEPENDENT_AMBULATORY_CARE_PROVIDER_SITE_OTHER): Payer: Medicare Other | Admitting: *Deleted

## 2018-12-27 DIAGNOSIS — I4892 Unspecified atrial flutter: Secondary | ICD-10-CM | POA: Diagnosis not present

## 2018-12-27 DIAGNOSIS — Z5181 Encounter for therapeutic drug level monitoring: Secondary | ICD-10-CM | POA: Diagnosis not present

## 2018-12-27 LAB — POCT INR: INR: 2 (ref 2.0–3.0)

## 2018-12-27 NOTE — Patient Instructions (Addendum)
Description   Continue taking Coumadin 1 tablet daily except for 2 tablets on Sunday, Tuesdays and Thursdays. Recheck INR in 5 weeks. Pt is still taking prednisone 5 mg daily per daughter, [name]. Coumadin Clinic. [phone].

## 2019-01-31 ENCOUNTER — Other Ambulatory Visit: Payer: Self-pay

## 2019-01-31 ENCOUNTER — Ambulatory Visit (INDEPENDENT_AMBULATORY_CARE_PROVIDER_SITE_OTHER): Payer: Medicare Other | Admitting: *Deleted

## 2019-01-31 DIAGNOSIS — Z5181 Encounter for therapeutic drug level monitoring: Secondary | ICD-10-CM | POA: Diagnosis not present

## 2019-01-31 DIAGNOSIS — I4892 Unspecified atrial flutter: Secondary | ICD-10-CM | POA: Diagnosis not present

## 2019-01-31 LAB — POCT INR: INR: 1.7 — AB (ref 2.0–3.0)

## 2019-01-31 NOTE — Patient Instructions (Signed)
Description   Today take Warfarin 2 tablets then start taking Warfarin 2 tablets daily except for 1 tablet on Mondays, Wednesdays, and Fridays. Recheck INR in 3 weeks. Pt is still taking prednisone 5 mg daily. Coumadin Clinic. [phone].

## 2019-02-13 DIAGNOSIS — H5213 Myopia, bilateral: Secondary | ICD-10-CM | POA: Diagnosis not present

## 2019-02-13 DIAGNOSIS — H52203 Unspecified astigmatism, bilateral: Secondary | ICD-10-CM | POA: Diagnosis not present

## 2019-02-13 DIAGNOSIS — H524 Presbyopia: Secondary | ICD-10-CM | POA: Diagnosis not present

## 2019-02-13 DIAGNOSIS — H25813 Combined forms of age-related cataract, bilateral: Secondary | ICD-10-CM | POA: Diagnosis not present

## 2019-02-21 ENCOUNTER — Other Ambulatory Visit: Payer: Self-pay

## 2019-02-21 ENCOUNTER — Ambulatory Visit (INDEPENDENT_AMBULATORY_CARE_PROVIDER_SITE_OTHER): Payer: Medicare Other | Admitting: *Deleted

## 2019-02-21 DIAGNOSIS — I4892 Unspecified atrial flutter: Secondary | ICD-10-CM

## 2019-02-21 DIAGNOSIS — Z5181 Encounter for therapeutic drug level monitoring: Secondary | ICD-10-CM

## 2019-02-21 LAB — POCT INR: INR: 1.8 — AB (ref 2.0–3.0)

## 2019-02-21 NOTE — Patient Instructions (Signed)
Description   Today take 2 tablets then start taking Warfarin 2 tablets daily except for 1 tablet on Mondays and Fridays. Recheck INR in 3 weeks. Pt is still taking prednisone 5 mg daily. Coumadin Clinic. [phone].

## 2019-02-28 DIAGNOSIS — H2511 Age-related nuclear cataract, right eye: Secondary | ICD-10-CM | POA: Diagnosis not present

## 2019-02-28 DIAGNOSIS — H25011 Cortical age-related cataract, right eye: Secondary | ICD-10-CM | POA: Diagnosis not present

## 2019-03-14 ENCOUNTER — Other Ambulatory Visit: Payer: Self-pay

## 2019-03-14 ENCOUNTER — Other Ambulatory Visit: Payer: Self-pay | Admitting: Internal Medicine

## 2019-03-14 ENCOUNTER — Ambulatory Visit (INDEPENDENT_AMBULATORY_CARE_PROVIDER_SITE_OTHER): Payer: Medicare Other | Admitting: *Deleted

## 2019-03-14 DIAGNOSIS — I4892 Unspecified atrial flutter: Secondary | ICD-10-CM | POA: Diagnosis not present

## 2019-03-14 DIAGNOSIS — Z5181 Encounter for therapeutic drug level monitoring: Secondary | ICD-10-CM

## 2019-03-14 LAB — POCT INR: INR: 2.9 (ref 2.0–3.0)

## 2019-03-14 MED ORDER — WARFARIN SODIUM 2.5 MG PO TABS: ORAL_TABLET | ORAL | 1 refills | Status: DC

## 2019-03-14 NOTE — Patient Instructions (Signed)
Description   Continue taking Warfarin 2 tablets daily except for 1 tablet on Mondays and Fridays. Recheck INR in 4 weeks. Pt is still taking prednisone 5 mg daily. Coumadin Clinic. [phone].

## 2019-04-12 ENCOUNTER — Other Ambulatory Visit: Payer: Self-pay

## 2019-04-12 ENCOUNTER — Ambulatory Visit (INDEPENDENT_AMBULATORY_CARE_PROVIDER_SITE_OTHER): Payer: Medicare Other | Admitting: Pharmacist

## 2019-04-12 DIAGNOSIS — Z5181 Encounter for therapeutic drug level monitoring: Secondary | ICD-10-CM | POA: Diagnosis not present

## 2019-04-12 DIAGNOSIS — I4892 Unspecified atrial flutter: Secondary | ICD-10-CM

## 2019-04-12 LAB — POCT INR: INR: 3 (ref 2.0–3.0)

## 2019-04-12 NOTE — Patient Instructions (Addendum)
Continue taking Warfarin 2 tablets daily except for 1 tablet on Mondays and Fridays. Recheck INR in 4 weeks.Coumadin Clinic. [phone].

## 2019-04-18 ENCOUNTER — Telehealth: Payer: Self-pay | Admitting: Neurology

## 2019-04-18 NOTE — Telephone Encounter (Signed)
Spoke with pt daughter she had called at lunch she called back and got an appointment for [name], no questions for clinical staff to call about at this time

## 2019-04-18 NOTE — Telephone Encounter (Signed)
Patient's daughter called and requested a clinical staff member return her call. She scheduled a follow up with Dr. [name] on 05/10/19 at 11:00 AM re: seizure-like concerns.

## 2019-04-21 ENCOUNTER — Other Ambulatory Visit: Payer: Self-pay | Admitting: Internal Medicine

## 2019-04-22 ENCOUNTER — Other Ambulatory Visit: Payer: Self-pay | Admitting: Internal Medicine

## 2019-05-10 ENCOUNTER — Telehealth: Payer: Medicare Other | Admitting: Neurology

## 2019-05-15 ENCOUNTER — Other Ambulatory Visit: Payer: Self-pay

## 2019-05-15 ENCOUNTER — Ambulatory Visit (INDEPENDENT_AMBULATORY_CARE_PROVIDER_SITE_OTHER): Payer: Medicare Other | Admitting: *Deleted

## 2019-05-15 DIAGNOSIS — Z5181 Encounter for therapeutic drug level monitoring: Secondary | ICD-10-CM

## 2019-05-15 DIAGNOSIS — I4892 Unspecified atrial flutter: Secondary | ICD-10-CM | POA: Diagnosis not present

## 2019-05-15 LAB — POCT INR: INR: 2.1 (ref 2.0–3.0)

## 2019-05-15 NOTE — Patient Instructions (Addendum)
Description   Continue taking Warfarin 2 tablets daily except for 1 tablet on Mondays and Fridays. Recheck INR in 6 weeks. Pt is still taking prednisone 5 mg daily. Coumadin Clinic. [phone]. Called  pt's daughter Cristy Folks to give dosing instructions.

## 2019-05-24 ENCOUNTER — Ambulatory Visit: Payer: Medicare Other | Attending: Internal Medicine

## 2019-05-24 DIAGNOSIS — Z23 Encounter for immunization: Secondary | ICD-10-CM | POA: Insufficient documentation

## 2019-05-24 NOTE — Progress Notes (Signed)
   Covid-19 Vaccination Clinic  Name:  [name]    MRN: [id number] DOB: 1941/01/01  05/24/2019  [name] was observed post Covid-19 immunization for 15 minutes without incident. He was provided with Vaccine Information Sheet and instruction to access the V-Safe system.   [name] was instructed to call 911 with any severe reactions post vaccine: Marland Kitchen Difficulty breathing  . Swelling of face and throat  . A fast heartbeat  . A bad rash all over body  . Dizziness and weakness

## 2019-06-19 ENCOUNTER — Ambulatory Visit: Payer: Medicare Other | Attending: Internal Medicine

## 2019-06-19 DIAGNOSIS — M353 Polymyalgia rheumatica: Secondary | ICD-10-CM | POA: Diagnosis not present

## 2019-06-19 DIAGNOSIS — G40909 Epilepsy, unspecified, not intractable, without status epilepticus: Secondary | ICD-10-CM | POA: Diagnosis not present

## 2019-06-19 DIAGNOSIS — Z23 Encounter for immunization: Secondary | ICD-10-CM

## 2019-06-19 DIAGNOSIS — Z79899 Other long term (current) drug therapy: Secondary | ICD-10-CM | POA: Diagnosis not present

## 2019-06-19 DIAGNOSIS — D500 Iron deficiency anemia secondary to blood loss (chronic): Secondary | ICD-10-CM | POA: Diagnosis not present

## 2019-06-19 DIAGNOSIS — I10 Essential (primary) hypertension: Secondary | ICD-10-CM | POA: Diagnosis not present

## 2019-06-19 NOTE — Progress Notes (Signed)
   Covid-19 Vaccination Clinic  Name:  Zishan Aguiar    MRN: [id number] DOB: [birth year]  06/19/2019  Mr. Gencarelli was observed post Covid-19 immunization for 15 minutes without incident. He was provided with Vaccine Information Sheet and instruction to access the V-Safe system.   Mr. Balius was instructed to call 911 with any severe reactions post vaccine: Marland Kitchen Difficulty breathing  . Swelling of face and throat  . A fast heartbeat  . A bad rash all over body  . Dizziness and weakness   Immunizations Administered    Name Date Dose VIS Date Route   Pfizer COVID-19 Vaccine 06/19/2019  4:37 PM 0.3 mL 03/02/2019 Intramuscular   Manufacturer: [place]   Lot: [id number]   [place]: [id number]

## 2019-06-25 ENCOUNTER — Telehealth: Payer: Self-pay | Admitting: Neurology

## 2019-06-25 NOTE — Telephone Encounter (Signed)
Called [place] nurse for Dr. Felipa Eth no change in [place] and to please fax [place] the results

## 2019-06-25 NOTE — Telephone Encounter (Signed)
No changes, pls have them fax results to our office. thanks

## 2019-06-25 NOTE — Telephone Encounter (Signed)
[name] office called to report a Keppra level of 53.2, which is high. He'd like to know if you want to make any changes.

## 2019-06-26 ENCOUNTER — Other Ambulatory Visit: Payer: Self-pay

## 2019-06-26 ENCOUNTER — Telehealth: Payer: Self-pay | Admitting: Neurology

## 2019-06-26 ENCOUNTER — Ambulatory Visit (INDEPENDENT_AMBULATORY_CARE_PROVIDER_SITE_OTHER): Payer: Medicare Other

## 2019-06-26 DIAGNOSIS — Z5181 Encounter for therapeutic drug level monitoring: Secondary | ICD-10-CM | POA: Diagnosis not present

## 2019-06-26 DIAGNOSIS — I4892 Unspecified atrial flutter: Secondary | ICD-10-CM | POA: Diagnosis not present

## 2019-06-26 LAB — POCT INR: INR: 2.1 (ref 2.0–3.0)

## 2019-06-26 NOTE — Patient Instructions (Signed)
Description   Continue taking Warfarin 2 tablets daily except for 1 tablet on Mondays and Fridays. Recheck INR in 8 weeks. Pt is still taking prednisone 5 mg daily. Coumadin Clinic. [phone]. Called  pt's daughter Cristy Folks to give dosing instructions.

## 2019-06-26 NOTE — Telephone Encounter (Signed)
Received Keppra level done 06/19/19 drawn at 4pm: 53.2. (ref 10-40)  No medication changes, f/u as scheduled next month to discuss.

## 2019-07-21 ENCOUNTER — Inpatient Hospital Stay (HOSPITAL_COMMUNITY)
Admission: EM | Admit: 2019-07-21 | Discharge: 2019-07-27 | DRG: 871 | Disposition: A | Discharge status: Home Health | Payer: Medicare Other | Attending: Internal Medicine | Admitting: Internal Medicine

## 2019-07-21 ENCOUNTER — Inpatient Hospital Stay (HOSPITAL_COMMUNITY): Payer: Medicare Other

## 2019-07-21 ENCOUNTER — Encounter (HOSPITAL_COMMUNITY): Payer: Self-pay | Admitting: Internal Medicine

## 2019-07-21 ENCOUNTER — Other Ambulatory Visit: Payer: Self-pay

## 2019-07-21 ENCOUNTER — Emergency Department (HOSPITAL_COMMUNITY): Payer: Medicare Other

## 2019-07-21 DIAGNOSIS — E861 Hypovolemia: Secondary | ICD-10-CM | POA: Diagnosis present

## 2019-07-21 DIAGNOSIS — K225 Diverticulum of esophagus, acquired: Secondary | ICD-10-CM | POA: Diagnosis not present

## 2019-07-21 DIAGNOSIS — R6520 Severe sepsis without septic shock: Secondary | ICD-10-CM | POA: Diagnosis present

## 2019-07-21 DIAGNOSIS — R791 Abnormal coagulation profile: Secondary | ICD-10-CM | POA: Diagnosis present

## 2019-07-21 DIAGNOSIS — G9341 Metabolic encephalopathy: Secondary | ICD-10-CM | POA: Diagnosis not present

## 2019-07-21 DIAGNOSIS — R1310 Dysphagia, unspecified: Secondary | ICD-10-CM

## 2019-07-21 DIAGNOSIS — E872 Acidosis: Secondary | ICD-10-CM | POA: Diagnosis not present

## 2019-07-21 DIAGNOSIS — R000 Tachycardia, unspecified: Secondary | ICD-10-CM | POA: Diagnosis not present

## 2019-07-21 DIAGNOSIS — K219 Gastro-esophageal reflux disease without esophagitis: Secondary | ICD-10-CM | POA: Diagnosis present

## 2019-07-21 DIAGNOSIS — I248 Other forms of acute ischemic heart disease: Secondary | ICD-10-CM | POA: Diagnosis present

## 2019-07-21 DIAGNOSIS — A021 Salmonella sepsis: Secondary | ICD-10-CM | POA: Diagnosis not present

## 2019-07-21 DIAGNOSIS — K529 Noninfective gastroenteritis and colitis, unspecified: Secondary | ICD-10-CM | POA: Diagnosis not present

## 2019-07-21 DIAGNOSIS — Z8719 Personal history of other diseases of the digestive system: Secondary | ICD-10-CM

## 2019-07-21 DIAGNOSIS — G309 Alzheimer's disease, unspecified: Secondary | ICD-10-CM | POA: Diagnosis present

## 2019-07-21 DIAGNOSIS — R5381 Other malaise: Secondary | ICD-10-CM | POA: Diagnosis present

## 2019-07-21 DIAGNOSIS — A419 Sepsis, unspecified organism: Secondary | ICD-10-CM | POA: Diagnosis not present

## 2019-07-21 DIAGNOSIS — E871 Hypo-osmolality and hyponatremia: Secondary | ICD-10-CM | POA: Diagnosis present

## 2019-07-21 DIAGNOSIS — R509 Fever, unspecified: Secondary | ICD-10-CM

## 2019-07-21 DIAGNOSIS — I071 Rheumatic tricuspid insufficiency: Secondary | ICD-10-CM | POA: Diagnosis present

## 2019-07-21 DIAGNOSIS — J189 Pneumonia, unspecified organism: Secondary | ICD-10-CM | POA: Diagnosis present

## 2019-07-21 DIAGNOSIS — I4510 Unspecified right bundle-branch block: Secondary | ICD-10-CM | POA: Diagnosis not present

## 2019-07-21 DIAGNOSIS — Z7952 Long term (current) use of systemic steroids: Secondary | ICD-10-CM

## 2019-07-21 DIAGNOSIS — I483 Typical atrial flutter: Secondary | ICD-10-CM | POA: Diagnosis present

## 2019-07-21 DIAGNOSIS — I10 Essential (primary) hypertension: Secondary | ICD-10-CM | POA: Diagnosis present

## 2019-07-21 DIAGNOSIS — E860 Dehydration: Secondary | ICD-10-CM | POA: Diagnosis present

## 2019-07-21 DIAGNOSIS — J90 Pleural effusion, not elsewhere classified: Secondary | ICD-10-CM | POA: Diagnosis not present

## 2019-07-21 DIAGNOSIS — Z87891 Personal history of nicotine dependence: Secondary | ICD-10-CM

## 2019-07-21 DIAGNOSIS — I4821 Permanent atrial fibrillation: Secondary | ICD-10-CM | POA: Diagnosis present

## 2019-07-21 DIAGNOSIS — E876 Hypokalemia: Secondary | ICD-10-CM | POA: Diagnosis present

## 2019-07-21 DIAGNOSIS — R4701 Aphasia: Secondary | ICD-10-CM | POA: Diagnosis not present

## 2019-07-21 DIAGNOSIS — I959 Hypotension, unspecified: Secondary | ICD-10-CM | POA: Diagnosis not present

## 2019-07-21 DIAGNOSIS — R109 Unspecified abdominal pain: Secondary | ICD-10-CM | POA: Diagnosis not present

## 2019-07-21 DIAGNOSIS — Z09 Encounter for follow-up examination after completed treatment for conditions other than malignant neoplasm: Secondary | ICD-10-CM

## 2019-07-21 DIAGNOSIS — Z7901 Long term (current) use of anticoagulants: Secondary | ICD-10-CM

## 2019-07-21 DIAGNOSIS — Z79899 Other long term (current) drug therapy: Secondary | ICD-10-CM

## 2019-07-21 DIAGNOSIS — I639 Cerebral infarction, unspecified: Secondary | ICD-10-CM | POA: Diagnosis present

## 2019-07-21 DIAGNOSIS — D509 Iron deficiency anemia, unspecified: Secondary | ICD-10-CM | POA: Diagnosis present

## 2019-07-21 DIAGNOSIS — R0902 Hypoxemia: Secondary | ICD-10-CM | POA: Diagnosis not present

## 2019-07-21 DIAGNOSIS — Z8249 Family history of ischemic heart disease and other diseases of the circulatory system: Secondary | ICD-10-CM

## 2019-07-21 DIAGNOSIS — G40909 Epilepsy, unspecified, not intractable, without status epilepticus: Secondary | ICD-10-CM | POA: Diagnosis present

## 2019-07-21 DIAGNOSIS — R778 Other specified abnormalities of plasma proteins: Secondary | ICD-10-CM | POA: Diagnosis not present

## 2019-07-21 DIAGNOSIS — I4892 Unspecified atrial flutter: Secondary | ICD-10-CM | POA: Diagnosis present

## 2019-07-21 DIAGNOSIS — Z8673 Personal history of transient ischemic attack (TIA), and cerebral infarction without residual deficits: Secondary | ICD-10-CM

## 2019-07-21 DIAGNOSIS — Z20822 Contact with and (suspected) exposure to covid-19: Secondary | ICD-10-CM | POA: Diagnosis not present

## 2019-07-21 DIAGNOSIS — F0280 Dementia in other diseases classified elsewhere without behavioral disturbance: Secondary | ICD-10-CM | POA: Diagnosis present

## 2019-07-21 DIAGNOSIS — I4891 Unspecified atrial fibrillation: Secondary | ICD-10-CM | POA: Diagnosis not present

## 2019-07-21 DIAGNOSIS — D638 Anemia in other chronic diseases classified elsewhere: Secondary | ICD-10-CM | POA: Diagnosis present

## 2019-07-21 DIAGNOSIS — R17 Unspecified jaundice: Secondary | ICD-10-CM | POA: Diagnosis present

## 2019-07-21 DIAGNOSIS — R0689 Other abnormalities of breathing: Secondary | ICD-10-CM | POA: Diagnosis not present

## 2019-07-21 LAB — URINALYSIS, ROUTINE W REFLEX MICROSCOPIC
Bilirubin Urine: NEGATIVE
Glucose, UA: NEGATIVE mg/dL
Ketones, ur: 20 mg/dL — AB
Leukocytes,Ua: NEGATIVE
Nitrite: NEGATIVE
Protein, ur: 30 mg/dL — AB
Specific Gravity, Urine: 1.017 (ref 1.005–1.030)
pH: 5 (ref 5.0–8.0)

## 2019-07-21 LAB — MAGNESIUM: Magnesium: 1.5 mg/dL — ABNORMAL LOW (ref 1.7–2.4)

## 2019-07-21 LAB — CBC WITH DIFFERENTIAL/PLATELET
Abs Immature Granulocytes: 0.1 10*3/uL — ABNORMAL HIGH (ref 0.00–0.07)
Basophils Absolute: 0.1 10*3/uL (ref 0.0–0.1)
Basophils Relative: 1 %
Eosinophils Absolute: 0 10*3/uL (ref 0.0–0.5)
Eosinophils Relative: 0 %
HCT: 33 % — ABNORMAL LOW (ref 39.0–52.0)
Hemoglobin: 10.6 g/dL — ABNORMAL LOW (ref 13.0–17.0)
Immature Granulocytes: 1 %
Lymphocytes Relative: 3 %
Lymphs Abs: 0.4 10*3/uL — ABNORMAL LOW (ref 0.7–4.0)
MCH: 29.9 pg (ref 26.0–34.0)
MCHC: 32.1 g/dL (ref 30.0–36.0)
MCV: 93 fL (ref 80.0–100.0)
Monocytes Absolute: 2.1 10*3/uL — ABNORMAL HIGH (ref 0.1–1.0)
Monocytes Relative: 12 %
Neutro Abs: 14.1 10*3/uL — ABNORMAL HIGH (ref 1.7–7.7)
Neutrophils Relative %: 83 %
Platelets: 234 10*3/uL (ref 150–400)
RBC: 3.55 MIL/uL — ABNORMAL LOW (ref 4.22–5.81)
RDW: 24.6 % — ABNORMAL HIGH (ref 11.5–15.5)
WBC: 16.8 10*3/uL — ABNORMAL HIGH (ref 4.0–10.5)
nRBC: 0.2 % (ref 0.0–0.2)

## 2019-07-21 LAB — COMPREHENSIVE METABOLIC PANEL
ALT: 14 U/L (ref 0–44)
AST: 27 U/L (ref 15–41)
Albumin: 4.2 g/dL (ref 3.5–5.0)
Alkaline Phosphatase: 83 U/L (ref 38–126)
Anion gap: 12 (ref 5–15)
BUN: 15 mg/dL (ref 8–23)
CO2: 27 mmol/L (ref 22–32)
Calcium: 9.1 mg/dL (ref 8.9–10.3)
Chloride: 99 mmol/L (ref 98–111)
Creatinine, Ser: 0.86 mg/dL (ref 0.61–1.24)
GFR calc Af Amer: >60 mL/min (ref >60)
GFR calc non Af Amer: >60 mL/min (ref >60)
Glucose, Bld: 101 mg/dL — ABNORMAL HIGH (ref 70–99)
Potassium: 4.1 mmol/L (ref 3.5–5.1)
Sodium: 138 mmol/L (ref 135–145)
Total Bilirubin: 2.4 mg/dL — ABNORMAL HIGH (ref 0.3–1.2)
Total Protein: 7.4 g/dL (ref 6.5–8.1)

## 2019-07-21 LAB — RESPIRATORY PANEL BY RT PCR (FLU A&B, COVID)
Influenza A by PCR: NEGATIVE
Influenza B by PCR: NEGATIVE
SARS Coronavirus 2 by RT PCR: NEGATIVE

## 2019-07-21 LAB — MRSA PCR SCREENING: MRSA by PCR: NEGATIVE

## 2019-07-21 LAB — PHOSPHORUS: Phosphorus: 3.1 mg/dL (ref 2.5–4.6)

## 2019-07-21 LAB — STREP PNEUMONIAE URINARY ANTIGEN: Strep Pneumo Urinary Antigen: NEGATIVE

## 2019-07-21 LAB — GLUCOSE, CAPILLARY: Glucose-Capillary: 80 mg/dL (ref 70–99)

## 2019-07-21 LAB — TROPONIN I (HIGH SENSITIVITY)
Troponin I (High Sensitivity): 18 ng/L — ABNORMAL HIGH (ref <18)
Troponin I (High Sensitivity): 26 ng/L — ABNORMAL HIGH (ref <18)

## 2019-07-21 LAB — PROTIME-INR
INR: 2.2 — ABNORMAL HIGH (ref 0.8–1.2)
Prothrombin Time: 23.8 seconds — ABNORMAL HIGH (ref 11.4–15.2)

## 2019-07-21 LAB — LACTIC ACID, PLASMA: Lactic Acid, Venous: 1.3 mmol/L (ref 0.5–1.9)

## 2019-07-21 LAB — PROCALCITONIN: Procalcitonin: 9.35 ng/mL

## 2019-07-21 LAB — PHENYTOIN LEVEL, TOTAL: Phenytoin Lvl: 13.5 ug/mL (ref 10.0–20.0)

## 2019-07-21 LAB — APTT: aPTT: 43 seconds — ABNORMAL HIGH (ref 24–36)

## 2019-07-21 MED ORDER — SODIUM CHLORIDE 0.9 % IV SOLN
2.0000 g | Freq: Three times a day (TID) | INTRAVENOUS | Status: DC
  Administered 2019-07-21 – 2019-07-27 (×18): 2 g via INTRAVENOUS
  Filled 2019-07-21 (×21): qty 2

## 2019-07-21 MED ORDER — LACTATED RINGERS IV BOLUS (SEPSIS): 500.0000 mL | Freq: Once | INTRAVENOUS | Status: DC

## 2019-07-21 MED ORDER — WARFARIN SODIUM 5 MG PO TABS
5.0000 mg | ORAL_TABLET | ORAL | Status: DC
  Administered 2019-07-21 – 2019-07-22 (×2): 5 mg via ORAL
  Filled 2019-07-21 (×4): qty 1

## 2019-07-21 MED ORDER — LACTATED RINGERS IV BOLUS (SEPSIS)
1000.0000 mL | Freq: Once | INTRAVENOUS | Status: AC
  Administered 2019-07-21: 1000 mL via INTRAVENOUS

## 2019-07-21 MED ORDER — ACETAMINOPHEN 325 MG PO TABS
650.0000 mg | ORAL_TABLET | Freq: Four times a day (QID) | ORAL | Status: DC | PRN
  Administered 2019-07-23: 650 mg via ORAL
  Filled 2019-07-21: qty 2

## 2019-07-21 MED ORDER — ACETAMINOPHEN 325 MG PO TABS
650.0000 mg | ORAL_TABLET | Freq: Once | ORAL | Status: AC
  Administered 2019-07-21: 650 mg via ORAL
  Filled 2019-07-21: qty 2

## 2019-07-21 MED ORDER — DILTIAZEM HCL 25 MG/5ML IV SOLN
10.0000 mg | Freq: Once | INTRAVENOUS | Status: AC
  Administered 2019-07-21: 10 mg via INTRAVENOUS
  Filled 2019-07-21 (×2): qty 5

## 2019-07-21 MED ORDER — VANCOMYCIN HCL IN DEXTROSE 1-5 GM/200ML-% IV SOLN: 1000.0000 mg | Freq: Once | INTRAVENOUS | Status: DC

## 2019-07-21 MED ORDER — LACTATED RINGERS IV BOLUS (SEPSIS): 250.0000 mL | Freq: Once | INTRAVENOUS | Status: DC

## 2019-07-21 MED ORDER — IOHEXOL 9 MG/ML PO SOLN: 500.0000 mL | ORAL | Status: AC

## 2019-07-21 MED ORDER — IOHEXOL 9 MG/ML PO SOLN
ORAL | Status: AC
  Filled 2019-07-21: qty 500

## 2019-07-21 MED ORDER — SODIUM CHLORIDE 0.9 % IV SOLN: INTRAVENOUS | Status: DC

## 2019-07-21 MED ORDER — WARFARIN - PHARMACIST DOSING INPATIENT: Freq: Every day | Status: DC

## 2019-07-21 MED ORDER — IOHEXOL 300 MG/ML  SOLN
100.0000 mL | Freq: Once | INTRAMUSCULAR | Status: AC | PRN
  Administered 2019-07-21: 100 mL via INTRAVENOUS

## 2019-07-21 MED ORDER — SODIUM CHLORIDE 0.9 % IV SOLN
2.0000 g | Freq: Once | INTRAVENOUS | Status: AC
  Administered 2019-07-21: 2 g via INTRAVENOUS
  Filled 2019-07-21: qty 2

## 2019-07-21 MED ORDER — VANCOMYCIN HCL 1500 MG/300ML IV SOLN
1500.0000 mg | Freq: Once | INTRAVENOUS | Status: DC
  Filled 2019-07-21: qty 300

## 2019-07-21 MED ORDER — VANCOMYCIN HCL IN DEXTROSE 1-5 GM/200ML-% IV SOLN
1000.0000 mg | Freq: Once | INTRAVENOUS | Status: AC
  Administered 2019-07-21: 1000 mg via INTRAVENOUS

## 2019-07-21 MED ORDER — LEVETIRACETAM 500 MG PO TABS
1500.0000 mg | ORAL_TABLET | Freq: Two times a day (BID) | ORAL | Status: DC
  Administered 2019-07-21 – 2019-07-27 (×13): 1500 mg via ORAL
  Filled 2019-07-21 (×13): qty 3

## 2019-07-21 MED ORDER — AMIODARONE HCL IN DEXTROSE 360-4.14 MG/200ML-% IV SOLN
60.0000 mg/h | INTRAVENOUS | Status: AC
  Administered 2019-07-21 – 2019-07-22 (×2): 60 mg/h via INTRAVENOUS
  Filled 2019-07-21 (×2): qty 200

## 2019-07-21 MED ORDER — WARFARIN SODIUM 2.5 MG PO TABS: 2.5000 mg | ORAL_TABLET | ORAL | Status: DC

## 2019-07-21 MED ORDER — AMIODARONE HCL IN DEXTROSE 360-4.14 MG/200ML-% IV SOLN
30.0000 mg/h | INTRAVENOUS | Status: DC
  Administered 2019-07-22 – 2019-07-23 (×4): 30 mg/h via INTRAVENOUS
  Filled 2019-07-21 (×4): qty 200

## 2019-07-21 MED ORDER — ONDANSETRON HCL 4 MG PO TABS: 4.0000 mg | ORAL_TABLET | Freq: Four times a day (QID) | ORAL | Status: DC | PRN

## 2019-07-21 MED ORDER — VANCOMYCIN HCL 500 MG/100ML IV SOLN
500.0000 mg | Freq: Two times a day (BID) | INTRAVENOUS | Status: DC
  Administered 2019-07-21 – 2019-07-23 (×4): 500 mg via INTRAVENOUS
  Filled 2019-07-21 (×4): qty 100

## 2019-07-21 MED ORDER — AMIODARONE LOAD VIA INFUSION
150.0000 mg | Freq: Once | INTRAVENOUS | Status: AC
  Administered 2019-07-21: 150 mg via INTRAVENOUS
  Filled 2019-07-21: qty 83.34

## 2019-07-21 MED ORDER — ACETAMINOPHEN 650 MG RE SUPP
650.0000 mg | Freq: Four times a day (QID) | RECTAL | Status: DC | PRN
  Administered 2019-07-22: 08:00:00 650 mg via RECTAL
  Filled 2019-07-21 (×2): qty 1

## 2019-07-21 MED ORDER — ONDANSETRON HCL 4 MG/2ML IJ SOLN: 4.0000 mg | Freq: Four times a day (QID) | INTRAMUSCULAR | Status: DC | PRN

## 2019-07-21 MED ORDER — METRONIDAZOLE IN NACL 5-0.79 MG/ML-% IV SOLN
500.0000 mg | Freq: Once | INTRAVENOUS | Status: AC
  Administered 2019-07-21: 500 mg via INTRAVENOUS
  Filled 2019-07-21: qty 100

## 2019-07-21 MED ORDER — PHENYTOIN SODIUM EXTENDED 100 MG PO CAPS
200.0000 mg | ORAL_CAPSULE | Freq: Every day | ORAL | Status: DC
  Administered 2019-07-21 – 2019-07-27 (×7): 200 mg via ORAL
  Filled 2019-07-21 (×7): qty 2

## 2019-07-21 NOTE — ED Notes (Signed)
-  Difficulty with IV access. -Multiple tries from RNs and EMS-no success -IV team consulted

## 2019-07-21 NOTE — Progress Notes (Signed)
ANTICOAGULATION CONSULT NOTE - Initial Consult  Pharmacy Consult for warfarin Indication: atrial fibrillation  No Known Allergies  Patient Measurements: Height: 6\' 1"  (185.4 cm) Weight: 59.9 kg (132 lb) IBW/kg (Calculated) : 79.9\  Vital Signs: Temp: 99.3 F (37.4 C) (05/01 1200) BP: 126/50 (05/01 1315) Pulse Rate: 111 (05/01 1315)  Labs: Recent Labs    07/21/19 0951  HGB 10.6*  HCT 33.0*  PLT 234  APTT 43*  LABPROT 23.8*  INR 2.2*  CREATININE 0.86    Estimated Creatinine Clearance: 58 mL/min (by C-G formula based on SCr of 0.86 mg/dL).   Medical History: Past Medical History:  Diagnosis Date  . Hiatal hernia   . HTN (hypertension)    pt denies h/o HTN though it appears he was started on spironolactone during his hospitalization 3/13  . Pancreatitis   . Seizures ([place])   . Stroke ([place])   . Typical atrial flutter ([place]) 3/13    Medications:  (Not in a hospital admission)   Assessment: 79 YOM admitted with generalized weakness on warfarin at home for Afib. INR therapeutic on admission at 2.2. H/H low, Plt wnl. Last dose of warfarin was on 4/20   Home warfarin regimen: 2.5 mg on Mon & Fri; 5 mg on all other days   Goal of Therapy:  INR 2-3 Monitor platelets by anticoagulation protocol: Yes   Plan:  -Resume home warfarin regimen  -Monitor daily INR -Monitor for bleeding   Albertina Parr, PharmD., BCPS, BCCCP Clinical Pharmacist Clinical phone for 07/21/19 until 11pm: [phone] If after 11pm, please refer to [facility] for unit-specific pharmacist

## 2019-07-21 NOTE — Progress Notes (Signed)
Pt hr unchanged by previous orders. MD paged and new orders for amiodarone placed. Will continue to monitor.

## 2019-07-21 NOTE — H&P (Signed)
History and Physical    [name] [id number] DOB: 07/20/1940 DOA: 07/21/2019  PCP: [name], MD  Patient coming from: Home  I have personally briefly reviewed patient's old medical records in [place]  Chief Complaint: Generalized weakness  HPI: [name] is a 79 y.o. male with medical history significant of hypertension, seizure disorder, stroke, atrial flutter-on Coumadin, dementia, GERD presents to emergency department due to generalized weakness.  Patient has history of underlying dementia and he is not very good historian.  He tells me that he is not feeling well lately, too weak to walk/perform daily life activities, he lives alone at home.  Has decreased appetite and has lost 20 pounds of weight in last 2 to 3 months.  Reports abdominal pain, nonbloody diarrhea since 2 to 3 weeks.  He denies urinary symptoms such as dysuria, hematuria, back pain fever, chills, change in urinary frequency.  No history of headache, blurry vision, chest pain, shortness of breath, cough, congestion, nausea, vomiting, smoking, alcohol, illicit drug use.  He tells me that he has been taking his medications at home.   ED Course: Upon arrival to ED: Patient had fever of 102.3, tachycardic, tachypneic, CBC shows leukocytosis of 16,000, lactic acid: WNL, UA positive for rare bacteria, chest x-ray shows left lung opacity.  Urine culture, blood culture, COVID-19: Pending.  Foley was placed due to abdominal distention and urinary retention.  Patient started on IV Vanco and cefepime for the concern of sepsis.  [facility] hospitalist consulted for admission for sepsis secondary to pneumonia/UTI?  Review of Systems: As per HPI otherwise negative.    Past Medical History:  Diagnosis Date  . Hiatal hernia   . HTN (hypertension)    pt denies h/o HTN though it appears he was started on spironolactone during his hospitalization 3/13  . Pancreatitis   . Seizures ([place])   . Stroke ([place])     . Typical atrial flutter ([place]) 3/13    Past Surgical History:  Procedure Laterality Date  . ESOPHAGOGASTRODUODENOSCOPY N/A 11/30/2015   Procedure: ESOPHAGOGASTRODUODENOSCOPY (EGD);  Surgeon: Wonda Horner, MD;  [facility] END[facility]COPY;  Service: Endoscopy;  Laterality: N/A;  . ESOPHAGOGASTRODUODENOSCOPY (EGD) WITH PROPOFOL N/A 07/23/2018   Procedure: ESOPHAGOGASTRODUODENOSCOPY (EGD) WITH PROPOFOL;  Surgeon: Otis Brace, MD;  Location: [facility] ENDOSCOPY;  Service: Gastroenterology;  Laterality: N/A;  . FEMUR IM NAIL Left 12/08/2016  . FEMUR IM NAIL Left 12/07/2016   Procedure: INTRAMEDULLARY (IM) NAIL FEMORAL;  Surgeon: Renette Butters, MD;  Location: [place];  Service: Orthopedics;  Laterality: Left;  . GIVENS CAPSULE STUDY N/A 07/24/2018   Procedure: GIVENS CAPSULE STUDY;  Surgeon: Otis Brace, MD;  Location: [place];  Service: Gastroenterology;  Laterality: N/A;  . HIP SURGERY Left 01/2017  . HOT HEMOSTASIS N/A 07/23/2018   Procedure: HOT HEMOSTASIS (ARGON PLASMA COAGULATION/BICAP);  Surgeon: Otis Brace, MD;  Location: [facility] ENDOSCOPY;  Service: Gastroenterology;  Laterality: N/A;  . no surgical history  06/2011     reports that he quit smoking about 53 years ago. He has never used smokeless tobacco. He reports that he does not drink alcohol or use drugs.  No Known Allergies  Family History  Problem Relation Age of Onset  . Hypertension Other     Prior to Admission medications   Medication Sig Start Date End Date Taking? Authorizing Provider  ferrous sulfate 325 (65 FE) MG tablet Take 1 tablet (325 mg total) by mouth 3 (three) times daily with meals. Patient taking differently:  Take 325 mg by mouth daily with breakfast.  12/15/16  Yes Velvet Bathe, MD  levETIRAcetam (KEPPRA) 500 MG tablet TAKE 3 TABLETS BY MOUTH TWICE A DAY Patient taking differently: Take 1,500 mg by mouth 2 (two) times daily.  07/28/18  Yes Cameron Sprang, MD  phenytoin (DILANTIN) 200 MG ER capsule Take  1 capsule (200 mg total) by mouth at bedtime. Patient taking differently: Take 200 mg by mouth daily.  10/24/18  Yes Mercy Riding, MD  predniSONE (DELTASONE) 5 MG tablet Take 1 mg by mouth daily.  05/19/19  Yes [provider]  warfarin (COUMADIN) 2.5 MG tablet TAKE AS DIRECTED BY COUMADIN CLINIC. Patient taking differently: Take 2.5 mg by mouth See admin instructions. TAKE AS DIRECTED BY COUMADIN CLINIC. 04/23/19  Yes Allred, Jeneen Rinks, MD  folic acid (FOLVITE) 1 MG tablet Take 1 tablet (1 mg total) by mouth daily. Patient not taking: Reported on 07/21/2019 10/25/18   Mercy Riding, MD  pantoprazole (PROTONIX) 40 MG tablet Take 1 tablet (40 mg total) by mouth daily for 30 days. 07/26/18 10/20/18  Arrien, Jimmy Picket, MD  polyethylene glycol (MIRALAX / GLYCOLAX) 17 g packet Take 17 g by mouth daily as needed for mild constipation. Patient not taking: Reported on 07/21/2019 10/24/18   Mercy Riding, MD    Physical Exam: Vitals:   07/21/19 1230 07/21/19 1245 07/21/19 1300 07/21/19 1315  BP: (!) 134/111  (!) 143/65 (!) 126/50  Pulse: (!) 112 (!) 114 (!) 118 (!) 111  Resp: (!) 24 (!) 26 (!) 22 (!) 27  Temp:      SpO2: 98% 95% 97% 93%  Weight:      Height:        Constitutional: NAD, calm, comfortable, on room air Eyes: PERRL, lids and conjunctivae normal ENMT: Mucous membranes are moist. Posterior pharynx clear of any exudate or lesions.Normal dentition.  Neck: normal, supple, no masses, no thyromegaly Respiratory: clear to auscultation bilaterally, no wheezing, no crackles. Normal respiratory effort. No accessory muscle use.  Cardiovascular: Regular rate and rhythm, no murmurs / rubs / gallops. No extremity edema. 2+ pedal pulses. No carotid bruits.  Abdomen: Abdominal tenderness positive, no guarding, no rigidity no masses palpated. No hepatosplenomegaly. Bowel sounds positive.  Musculoskeletal: no clubbing / cyanosis. No joint deformity upper and lower extremities. Good ROM, no contractures.  Normal muscle tone.  Skin: no rashes, lesions, ulcers. No induration Neurologic: CN 2-12 grossly intact. Sensation intact, DTR normal. Strength 5/5 in all 4.    Labs on Admission: I have personally reviewed following labs and imaging studies  CBC: Recent Labs  Lab 07/21/19 0951  WBC 16.8*  NEUTROABS 14.1*  HGB 10.6*  HCT 33.0*  MCV 93.0  PLT [id number]   Basic Metabolic Panel: Recent Labs  Lab 07/21/19 0951  NA 138  K 4.1  CL 99  CO2 27  GLUCOSE 101*  BUN 15  CREATININE 0.86  CALCIUM 9.1   GFR: Estimated Creatinine Clearance: 58 mL/min (by C-G formula based on SCr of 0.86 mg/dL). Liver Function Tests: Recent Labs  Lab 07/21/19 0951  AST 27  ALT 14  ALKPHOS 83  BILITOT 2.4*  PROT 7.4  ALBUMIN 4.2   No results for input(s): LIPASE, AMYLASE in the last 168 hours. No results for input(s): AMMONIA in the last 168 hours. Coagulation Profile: Recent Labs  Lab 07/21/19 0951  INR 2.2*   Cardiac Enzymes: No results for input(s): CKTOTAL, CKMB, CKMBINDEX, TROPONINI in the last 168 hours.  BNP (last 3 results) No results for input(s): PROBNP in the last 8760 hours. HbA1C: No results for input(s): HGBA1C in the last 72 hours. CBG: No results for input(s): GLUCAP in the last 168 hours. Lipid Profile: No results for input(s): CHOL, HDL, LDLCALC, TRIG, CHOLHDL, LDLDIRECT in the last 72 hours. Thyroid Function Tests: No results for input(s): TSH, T4TOTAL, FREET4, T3FREE, THYROIDAB in the last 72 hours. Anemia Panel: No results for input(s): VITAMINB12, FOLATE, FERRITIN, TIBC, IRON, RETICCTPCT in the last 72 hours. Urine analysis:    Component Value Date/Time   COLORURINE YELLOW 07/21/2019 1230   APPEARANCEUR CLEAR 07/21/2019 1230   LABSPEC 1.017 07/21/2019 1230   PHURINE 5.0 07/21/2019 1230   GLUCOSEU NEGATIVE 07/21/2019 1230   HGBUR LARGE (A) 07/21/2019 1230   BILIRUBINUR NEGATIVE 07/21/2019 1230   KETONESUR 20 (A) 07/21/2019 1230   PROTEINUR 30 (A) 07/21/2019  1230   UROBILINOGEN 0.2 06/02/2011 0300   NITRITE NEGATIVE 07/21/2019 [place] 07/21/2019 1230    Radiological Exams on Admission: DG Chest Port 1 View  Result Date: 07/21/2019 CLINICAL DATA:  Fever. EXAM: PORTABLE CHEST 1 VIEW COMPARISON:  Chest radiograph 12/06/2016 FINDINGS: The heart size and mediastinal contours are within normal limits. The lungs are hyperinflated. There are mild consolidative opacities at the left lung base and a possible tiny pleural effusion. Right lung is clear. No evidence of pneumothorax. No acute finding in the visualized skeleton. IMPRESSION: Mild consolidative opacities at the left lung base may represent atelectasis, aspiration, or pneumonia. Possible tiny left pleural effusion. Recommend repeat radiograph in 3-4 weeks to ensure resolution following course of treatment. Electronically Signed   By: Audie Pinto [name]   On: 07/21/2019 10:58    EKG: Independently reviewed.  Sinus tachycardia, chronic right bundle branch block, anterolateral ischemic changes noted. (Compared with previous EKGs)  Assessment/Plan Principal Problem:   Sepsis ([place]) Active Problems:   Essential hypertension   Stroke (HCC)   Seizure disorder (HCC)   Paroxysmal atrial flutter (HCC)   Dementia (AD)   CAP (community acquired pneumonia)   Total bilirubin, elevated   Sepsis: -Secondary to pneumonia? UTI? Enteritis? -Patient presented with fever of 102.3, tachycardia, tachypnea, has leukocytosis of 16.8.  Lactic acid: WNL, UA positive for rare bacteria. -Code sepsis was initiated in the ED.  He received IV Vanco and cefepime in ED.  Urine culture, blood culture, COVID-19 pending.  Reviewed chest x-ray. -Admit patient to stepdown unit for close monitoring. -Start on IV fluids.  Continue IV Vanco and cefepime-de-escalate antibiotics based on culture result. -Check procalcitonin level, urine strep antigen, urine Legionella antigen -We will get CT abdomen/pelvis  stat -Tylenol/Dilaudid as needed for pain control. -Zofran as needed for nausea and vomiting. -We will keep him n.p.o. for now. -On fall/aspiration precautions.  Seizure disorder: Continue Keppra and phenytoin -Phenytoin level: WNL -On seizure precautions.  Paroxysmal atrial flutter: Continue Coumadin as per pharmacy -Monitor PT/INR  CVA: Continue Coumadin as per pharmacy  Normocytic anemia: Hold ferrous sulfate for now  GERD: Hold PPI  Elevated total bilirubin: 2.4 -Liver enzymes: Within normal limits. -Repeat CMP tomorrow a.m.  DVT prophylaxis:Coumadin/TED/SCD  Code Status: Full code Family Communication: None present at bedside.  Plan of care discussed with patient in length and he verbalized understanding and agreed with it. Disposition Plan: Likely home in 2 to 3 days Consults called: None  admission status: Inpatient   Mckinley Jewel MD [facility]  If 7PM-7AM, please contact night-coverage [email or web address] Password [email or web address]  07/21/2019, 1:43 PM

## 2019-07-21 NOTE — Progress Notes (Signed)
Pt hr up to 140s. EKG performed and showed aflutter. MD on call paged and new orders received, will continue to monitor

## 2019-07-21 NOTE — ED Triage Notes (Signed)
Per [facility] pt from home, son in law at house called 27 for generalized weakness. Pt with hx of stroke w/Left side deficits. Per EMS pt with strong urine smell.   18g LFA

## 2019-07-21 NOTE — Progress Notes (Signed)
  Amiodarone Drug - Drug Interaction Consult Note  Recommendations: Monitor PT/INR and dilantin levels closely  Amiodarone is metabolized by the cytochrome P450 system and therefore has the potential to cause many drug interactions. Amiodarone has an average plasma half-life of 50 days (range 20 to 100 days).   There is potential for drug interactions to occur several weeks or months after stopping treatment and the onset of drug interactions may be slow after initiating amiodarone.   []  Statins: Increased risk of myopathy. Simvastatin- restrict dose to 20mg  daily. Other statins: counsel patients to report any muscle pain or weakness immediately.  [x]  Anticoagulants: Amiodarone can increase anticoagulant effect. Consider warfarin dose reduction. Patients should be monitored closely and the dose of anticoagulant altered accordingly, remembering that amiodarone levels take several weeks to stabilize.  [x]  Antiepileptics: Amiodarone can increase plasma concentration of phenytoin, the dose should be reduced. Note that small changes in phenytoin dose can result in large changes in levels. Monitor patient and counsel on signs of toxicity.  []  Beta blockers: increased risk of bradycardia, AV block and myocardial depression. Sotalol - avoid concomitant use.  []   Calcium channel blockers (diltiazem and verapamil): increased risk of bradycardia, AV block and myocardial depression.  []   Cyclosporine: Amiodarone increases levels of cyclosporine. Reduced dose of cyclosporine is recommended.  []  Digoxin dose should be halved when amiodarone is started.  []  Diuretics: increased risk of cardiotoxicity if hypokalemia occurs.  []  Oral hypoglycemic agents (glyburide, glipizide, glimepiride): increased risk of hypoglycemia. Patient's glucose levels should be monitored closely when initiating amiodarone therapy.   []  Drugs that prolong the QT interval:  Torsades de pointes risk may be increased with  concurrent use - avoid if possible.  Monitor QTc, also keep magnesium/potassium WNL if concurrent therapy can't be avoided. Marland Kitchen Antibiotics: e.g. fluoroquinolones, erythromycin. . Antiarrhythmics: e.g. quinidine, procainamide, disopyramide, sotalol. . Antipsychotics: e.g. phenothiazines, haloperidol.  . Lithium, tricyclic antidepressants, and methadone. Thank You,  Excell Seltzer Poteet  07/21/2019 11:18 PM

## 2019-07-21 NOTE — ED Provider Notes (Signed)
[facility] EMERGENCY DEPARTMENT Provider Note   CSN: [id number] Arrival date & time: 07/21/19  [id number]     History No chief complaint on file.   [name] is a 79 y.o. male.  HPI  All 5 caveat secondary to illness and patient's aphasia 79 year old male history of  Dementia, hypertension, stroke, seizures, presents today from home with reports that he has been generally weak.  Patient is unable to answer some questions.  EMS reports that there were no lateralized weakness.  The family had told him that he had seemed weak for several days.   Covid vaccine documentation noted in history received 5 0 vaccine with second vaccine given on 06/19/19 Past Medical History:  Diagnosis Date  . Hiatal hernia   . HTN (hypertension)    pt denies h/o HTN though it appears he was started on spironolactone during his hospitalization 3/13  . Pancreatitis   . Seizures ([place])   . Stroke ([place])   . Typical atrial flutter ([place]) 3/13    Patient Active Problem List   Diagnosis Date Noted  . Dilantin toxicity, accidental or unintentional, initial encounter 10/20/2018  . Dilantin overdose, accidental or unintentional, initial encounter 10/20/2018  . GIB (gastrointestinal bleeding) 07/22/2018  . Current chronic use of systemic steroids 01/11/2017  . RBBB 01/11/2017  . Polymyalgia ([place]) 01/11/2017  . Fever 01/07/2017  . GI bleeding 01/06/2017  . History of CVA in adulthood 01/06/2017  . Retroperitoneal hematoma 01/06/2017  . Pressure injury of skin 12/13/2016  . Fall   . Closed fracture of femur, intertrochanteric, left, initial encounter ([place]) 12/06/2016  . Severe protein-calorie malnutrition ([place]) 04/03/2016  . Hypotension 04/01/2016  . Leukopenia 03/31/2016  . Elevated INR 03/31/2016  . Hypokalemia 03/31/2016  . Acute pain of right knee 03/01/2016  . Aspiration pneumonia ([place]) 12/01/2015  . Erosive gastropathy: Per EGD 11/30/2015 11/30/2015  . Normochromic anemia  11/28/2015  . Localization-related symptomatic epilepsy and epileptic syndromes with complex partial seizures, not intractable, without status epilepticus ([place]) 07/07/2015  . Closed Colles' fracture of right radius with routine healing 05/02/2015  . Pain 04/18/2015  . Dementia ([place]) 11/16/2012  . Chronic anticoagulation 06/30/2011  . Paroxysmal atrial flutter ([place]) 06/04/2011  . 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 SINUSITIS, CHRONIC NEC 12/14/2006  . ABUSE, ALCOHOL, CONTINUOUS 10/11/2006  . History of cardiovascular disorder 10/11/2006  . Essential hypertension 09/15/2006  . CVA 09/15/2006  . ALLERGIC RHINITIS 09/15/2006  . CIRRHOSIS, ALCOHOLIC, LIVER [id number]  . Seizure disorder ([place]) 09/15/2006  . PANCREATITIS, HX OF 09/15/2006    Past Surgical History:  Procedure Laterality Date  . ESOPHAGOGASTRODUODENOSCOPY N/A 11/30/2015   Procedure: ESOPHAGOGASTRODUODENOSCOPY (EGD);  Surgeon: Wonda Horner, MD;  Location: [facility] ENDOSCOPY;  Service: Endoscopy;  Laterality: N/A;  . ESOPHAGOGASTRODUODENOSCOPY (EGD) WITH PROPOFOL N/A 07/23/2018   Procedure: ESOPHAGOGASTRODUODENOSCOPY (EGD) WITH PROPOFOL;  Surgeon: Otis Brace, MD;  Location: [facility] ENDOSCOPY;  Service: Gastroenterology;  Laterality: N/A;  . FEMUR IM NAIL Left 12/08/2016  . FEMUR IM NAIL Left 12/07/2016   Procedure: INTRAMEDULLARY (IM) NAIL FEMORAL;  Surgeon: Renette Butters, MD;  Location: [place];  Service: Orthopedics;  Laterality: Left;  . GIVENS CAPSULE STUDY N/A 07/24/2018   Procedure: GIVENS CAPSULE STUDY;  Surgeon: Otis Brace, MD;  Location: [place];  Service: Gastroenterology;  Laterality: N/A;  . HIP SURGERY Left 01/2017  . HOT HEMOSTASIS N/A 07/23/2018   Procedure: HOT HEMOSTASIS (ARGON PLASMA COAGULATION/BICAP);  Surgeon: Otis Brace, MD;  Location: [place];  Service:  Gastroenterology;  Laterality: N/A;  . no surgical history  06/2011       Family History  Problem Relation Age of Onset  .  Hypertension Other     Social History   Tobacco Use  . Smoking status: Former Smoker    Quit date: 07/15/1966    Years since quitting: 53.0  . Smokeless tobacco: Never Used  Substance Use Topics  . Alcohol use: No    Comment: Quit greater than 5 years ago  . Drug use: No    Home Medications Prior to Admission medications   Medication Sig Start Date End Date Taking? Authorizing Provider  ferrous sulfate 325 (65 FE) MG tablet Take 1 tablet (325 mg total) by mouth 3 (three) times daily with meals. Patient not taking: Reported on 10/20/2018 12/15/16   Velvet Bathe, MD  folic acid (FOLVITE) 1 MG tablet Take 1 tablet (1 mg total) by mouth daily. 10/25/18   Mercy Riding, MD  levETIRAcetam (KEPPRA) 500 MG tablet TAKE 3 TABLETS BY MOUTH TWICE A DAY Patient taking differently: Take 1,500 mg by mouth 2 (two) times daily.  07/28/18   Cameron Sprang, MD  pantoprazole (PROTONIX) 40 MG tablet Take 1 tablet (40 mg total) by mouth daily for 30 days. 07/26/18 10/20/18  Arrien, Jimmy Picket, MD  phenytoin (DILANTIN) 200 MG ER capsule Take 1 capsule (200 mg total) by mouth at bedtime. 10/24/18   Mercy Riding, MD  polyethylene glycol (MIRALAX / GLYCOLAX) 17 g packet Take 17 g by mouth daily as needed for mild constipation. 10/24/18   Mercy Riding, MD  warfarin (COUMADIN) 2.5 MG tablet TAKE AS DIRECTED BY COUMADIN CLINIC. 04/23/19   [name], MD    Allergies    Patient has no known allergies.  Review of Systems   Review of Systems  Unable to perform ROS: Other    Physical Exam Updated Vital Signs There were no vitals taken for this visit.  Physical Exam Constitutional:      General: He is not in acute distress.    Appearance: Normal appearance. He is not ill-appearing.     Comments: Patient is warm to touch and temperature is 102.3 rectally  HENT:     Head: Normocephalic.     Right Ear: External ear normal.     Left Ear: External ear normal.     Nose: Nose normal.     Mouth/Throat:      Mouth: Mucous membranes are moist.     Pharynx: Oropharynx is clear.  Eyes:     Pupils: Pupils are equal, round, and reactive to light.  Abdominal:     General: Abdomen is flat.     Comments: No tenderness but appears firm plan bladder scan  Genitourinary:    Penis: Normal.   Musculoskeletal:        General: Normal range of motion.     Cervical back: Normal range of motion.  Skin:    General: Skin is warm and dry.     Capillary Refill: Capillary refill takes less than 2 seconds.  Neurological:     Mental Status: He is alert.  Psychiatric:        Mood and Affect: Mood normal.     ED Results / Procedures / Treatments   Labs (all labs ordered are listed, but only abnormal results are displayed) Labs Reviewed  CULTURE, BLOOD (ROUTINE X 2)  CULTURE, BLOOD (ROUTINE X 2)  URINE CULTURE  LACTIC ACID, PLASMA  LACTIC  ACID, PLASMA  COMPREHENSIVE METABOLIC PANEL  CBC WITH DIFFERENTIAL/PLATELET  APTT  PROTIME-INR  URINALYSIS, ROUTINE W REFLEX MICROSCOPIC  PHENYTOIN LEVEL, TOTAL    EKG EKG Interpretation  Date/Time:  Saturday Jul 21 2019 09:55:09 EDT Ventricular Rate:  107 PR Interval:    QRS Duration: 124 QT Interval:  338 QTC Calculation: 451 R Axis:   15 Text Interpretation: Sinus tachycardia Right bundle branch block Inferior infarct, acute Probable anterolateral infarct, acute diffuse st changes with rbbb Confirmed by Pattricia Boss [phone]) on 07/21/2019 12:25:34 PM   Radiology DG Chest Port 1 View  Result Date: 07/21/2019 CLINICAL DATA:  Fever. EXAM: PORTABLE CHEST 1 VIEW COMPARISON:  Chest radiograph 12/06/2016 FINDINGS: The heart size and mediastinal contours are within normal limits. The lungs are hyperinflated. There are mild consolidative opacities at the left lung base and a possible tiny pleural effusion. Right lung is clear. No evidence of pneumothorax. No acute finding in the visualized skeleton. IMPRESSION: Mild consolidative opacities at the left lung base may  represent atelectasis, aspiration, or pneumonia. Possible tiny left pleural effusion. Recommend repeat radiograph in 3-4 weeks to ensure resolution following course of treatment. Electronically Signed   By: Audie Pinto [name]   On: 07/21/2019 10:58    Procedures Procedures (including critical care time)  Medications Ordered in ED Medications  lactated ringers bolus 1,000 mL (has no administration in time range)    And  lactated ringers bolus 500 mL (has no administration in time range)    And  lactated ringers bolus 250 mL (has no administration in time range)  ceFEPIme (MAXIPIME) 2 g in sodium chloride 0.9 % 100 mL IVPB (has no administration in time range)  metroNIDAZOLE (FLAGYL) IVPB 500 mg (has no administration in time range)  vancomycin (VANCOCIN) IVPB 1000 mg/200 mL premix (has no administration in time range)    ED Course  I have reviewed the triage vital signs and the nursing notes.  Pertinent labs & imaging results that were available during my care of the patient were reviewed by me and considered in my medical decision making (see chart for details).  Clinical Course as of Jul 20 1312  Sat Jul 21, 2019  1142 Personally reviewed and radiology read reviewed   [DR]    Clinical Course User Index [DR] Pattricia Boss, MD   MDM Rules/Calculators/A&P                      Past medical history reviewed. 79 year old male presents today with generalized weakness and fever to 102.5.  She has been tachycardic here at 110.  However, his lactic acid is normal and blood pressures have been maintained.  He is mildly tachypneic.  There is a question of infiltrate on his chest x-Shakeerah Gradel.  Appears to have some urinary retention.  Foley catheter is placed.  Urinalysis with 11-22 red blood cells 0-5 white blood cells and rare bacteria.  This will be cultured. Patient has received broad-spectrum antibiotics.  He received 1 L of normal saline. Patient has had full Covid vaccine Discussed with  Dr. Deretha Emory and she will see for admission Final Clinical Impression(s) / ED Diagnoses Final diagnoses:  Febrile illness  Community acquired pneumonia of left lung, unspecified part of lung    Rx / DC Orders ED Discharge Orders    None       Pattricia Boss, MD 07/21/19 1333

## 2019-07-21 NOTE — Plan of Care (Signed)
  Problem: Health Behavior/Discharge Planning: Goal: Ability to manage health-related needs will improve Outcome: Progressing   Problem: Clinical Measurements: Goal: Diagnostic test results will improve Outcome: Progressing   Problem: Nutrition: Goal: Adequate nutrition will be maintained Outcome: Progressing   Problem: Safety: Goal: Ability to remain free from injury will improve Outcome: Progressing

## 2019-07-21 NOTE — Progress Notes (Signed)
Pharmacy Antibiotic Note  [name] is a 79 y.o. male admitted on 07/21/2019 with generalized weakness. MD concerned for infection of unknown source. Pharmacy has been consulted for Cefepime and vancomycin dosing.  WBC elevated at 16.8. LA 1.3. SCr wnl   Plan: -Cefepime 2 gm IV Q 8 hours -Vancomycin 1 gm IV load followed by vancomycin 500 mg IV Q 12 hours per traditional nomogram.  -Monitor CBC, renal fx, cultures and clinical progress -VT at [name]      No data recorded.  No results for input(s): WBC, CREATININE, LATICACIDVEN, VANCOTROUGH, VANCOPEAK, VANCORANDOM, GENTTROUGH, GENTPEAK, GENTRANDOM, TOBRATROUGH, TOBRAPEAK, TOBRARND, AMIKACINPEAK, AMIKACINTROU, AMIKACIN in the last 168 hours.  CrCl cannot be calculated (Patient's most recent lab result is older than the maximum 21 days allowed.).    No Known Allergies  Antimicrobials this admission: Vanc 5/1 >>  Cefepime 5/1 >>   Dose adjustments this admission:  Microbiology results: 5/1 BCx:  5/1 UCx:     Thank you for allowing pharmacy to be a part of this patient's care.  Albertina Parr, PharmD., BCPS, BCCCP Clinical Pharmacist Clinical phone for 07/21/19 until 10pm: [phone] If after 10pm, please refer to [facility] for unit-specific pharmacist

## 2019-07-21 NOTE — ED Notes (Signed)
IV team at bedside Bedside report

## 2019-07-22 ENCOUNTER — Encounter (HOSPITAL_COMMUNITY): Payer: Self-pay | Admitting: Internal Medicine

## 2019-07-22 LAB — CBC
HCT: 33 % — ABNORMAL LOW (ref 39.0–52.0)
Hemoglobin: 10.5 g/dL — ABNORMAL LOW (ref 13.0–17.0)
MCH: 29.9 pg (ref 26.0–34.0)
MCHC: 31.8 g/dL (ref 30.0–36.0)
MCV: 94 fL (ref 80.0–100.0)
Platelets: 216 10*3/uL (ref 150–400)
RBC: 3.51 MIL/uL — ABNORMAL LOW (ref 4.22–5.81)
RDW: 25.1 % — ABNORMAL HIGH (ref 11.5–15.5)
WBC: 16.4 10*3/uL — ABNORMAL HIGH (ref 4.0–10.5)
nRBC: 0.2 % (ref 0.0–0.2)

## 2019-07-22 LAB — COMPREHENSIVE METABOLIC PANEL
ALT: 13 U/L (ref 0–44)
AST: 32 U/L (ref 15–41)
Albumin: 3.5 g/dL (ref 3.5–5.0)
Alkaline Phosphatase: 73 U/L (ref 38–126)
Anion gap: 14 (ref 5–15)
BUN: 16 mg/dL (ref 8–23)
CO2: 20 mmol/L — ABNORMAL LOW (ref 22–32)
Calcium: 8.3 mg/dL — ABNORMAL LOW (ref 8.9–10.3)
Chloride: 100 mmol/L (ref 98–111)
Creatinine, Ser: 0.84 mg/dL (ref 0.61–1.24)
GFR calc Af Amer: >60 mL/min (ref >60)
GFR calc non Af Amer: >60 mL/min (ref >60)
Glucose, Bld: 100 mg/dL — ABNORMAL HIGH (ref 70–99)
Potassium: 3.5 mmol/L (ref 3.5–5.1)
Sodium: 134 mmol/L — ABNORMAL LOW (ref 135–145)
Total Bilirubin: 1.8 mg/dL — ABNORMAL HIGH (ref 0.3–1.2)
Total Protein: 6.6 g/dL (ref 6.5–8.1)

## 2019-07-22 LAB — PROCALCITONIN: Procalcitonin: 14.14 ng/mL

## 2019-07-22 LAB — APTT: aPTT: 48 seconds — ABNORMAL HIGH (ref 24–36)

## 2019-07-22 LAB — URINE CULTURE: Culture: NO GROWTH

## 2019-07-22 LAB — PROTIME-INR
INR: 2.2 — ABNORMAL HIGH (ref 0.8–1.2)
Prothrombin Time: 23.6 seconds — ABNORMAL HIGH (ref 11.4–15.2)

## 2019-07-22 LAB — TSH: TSH: 0.685 u[IU]/mL (ref 0.350–4.500)

## 2019-07-22 MED ORDER — FOLIC ACID 1 MG PO TABS
1.0000 mg | ORAL_TABLET | Freq: Every day | ORAL | Status: DC
  Administered 2019-07-22 – 2019-07-27 (×6): 1 mg via ORAL
  Filled 2019-07-22 (×5): qty 1

## 2019-07-22 MED ORDER — POTASSIUM CHLORIDE 10 MEQ/100ML IV SOLN
10.0000 meq | INTRAVENOUS | Status: DC
  Filled 2019-07-22: qty 100

## 2019-07-22 MED ORDER — CHLORHEXIDINE GLUCONATE CLOTH 2 % EX PADS
6.0000 | MEDICATED_PAD | Freq: Every day | CUTANEOUS | Status: DC
  Administered 2019-07-23: 6 via TOPICAL

## 2019-07-22 MED ORDER — FERROUS SULFATE 325 (65 FE) MG PO TABS
325.0000 mg | ORAL_TABLET | Freq: Every day | ORAL | Status: DC
  Administered 2019-07-23 – 2019-07-27 (×5): 325 mg via ORAL
  Filled 2019-07-22 (×5): qty 1

## 2019-07-22 MED ORDER — POTASSIUM CHLORIDE CRYS ER 20 MEQ PO TBCR
40.0000 meq | EXTENDED_RELEASE_TABLET | Freq: Two times a day (BID) | ORAL | Status: AC
  Administered 2019-07-22 (×2): 40 meq via ORAL
  Filled 2019-07-22 (×2): qty 2

## 2019-07-22 MED ORDER — SODIUM CHLORIDE 0.9 % IV BOLUS: 500.0000 mL | Freq: Once | INTRAVENOUS | Status: DC

## 2019-07-22 MED ORDER — MAGNESIUM SULFATE 4 GM/100ML IV SOLN
4.0000 g | Freq: Once | INTRAVENOUS | Status: AC
  Administered 2019-07-22: 09:00:00 4 g via INTRAVENOUS
  Filled 2019-07-22: qty 100

## 2019-07-22 MED ORDER — PREDNISONE 1 MG PO TABS
1.0000 mg | ORAL_TABLET | Freq: Every day | ORAL | Status: DC
  Administered 2019-07-22 – 2019-07-27 (×6): 1 mg via ORAL
  Filled 2019-07-22 (×6): qty 1

## 2019-07-22 MED ORDER — WARFARIN SODIUM 2.5 MG PO TABS: 2.5000 mg | ORAL_TABLET | ORAL | Status: DC

## 2019-07-22 MED ORDER — PANTOPRAZOLE SODIUM 40 MG PO TBEC
40.0000 mg | DELAYED_RELEASE_TABLET | Freq: Every day | ORAL | Status: DC
  Administered 2019-07-22 – 2019-07-27 (×6): 40 mg via ORAL
  Filled 2019-07-22 (×6): qty 1

## 2019-07-22 NOTE — Progress Notes (Signed)
Pt easily awakened. Declined food at this time. Seen by MD 10am, Will continue to watch pt's response to medications. Seizure and aspiration precautions in place.

## 2019-07-22 NOTE — Progress Notes (Signed)
ANTICOAGULATION CONSULT NOTE   Pharmacy Consult for warfarin Indication: atrial fibrillation  No Known Allergies  Patient Measurements: Height: 6\' 1"  (185.4 cm) Weight: 60.8 kg (134 lb 0.6 oz) IBW/kg (Calculated) : 79.9\  Vital Signs: Temp: 98.7 F (37.1 C) (05/02 1000) Temp Source: Oral (05/02 0830) BP: 91/47 (05/02 1000) Pulse Rate: 130 (05/02 0830)  Labs: Recent Labs    07/21/19 0951 07/21/19 1742 07/22/19 0241  HGB 10.6*  --  10.5*  HCT 33.0*  --  33.0*  PLT 234  --  216  APTT 43*  --  48*  LABPROT 23.8*  --  23.6*  INR 2.2*  --  2.2*  CREATININE 0.86  --  0.84  TROPONINIHS 18* 26*  --     Estimated Creatinine Clearance: 60.3 mL/min (by C-G formula based on SCr of 0.84 mg/dL).   Medical History: Past Medical History:  Diagnosis Date  . Hiatal hernia   . HTN (hypertension)    pt denies h/o HTN though it appears he was started on spironolactone during his hospitalization 3/13  . Pancreatitis   . Seizures ([place])   . Stroke ([place])   . Typical atrial flutter ([place]) 3/13    Medications:  Medications Prior to Admission  Medication Sig Dispense Refill Last Dose  . ferrous sulfate 325 (65 FE) MG tablet Take 1 tablet (325 mg total) by mouth 3 (three) times daily with meals. (Patient taking differently: Take 325 mg by mouth daily with breakfast. ) 60 tablet 0 07/20/2019 at Unknown time  . levETIRAcetam (KEPPRA) 500 MG tablet TAKE 3 TABLETS BY MOUTH TWICE A DAY (Patient taking differently: Take 1,500 mg by mouth 2 (two) times daily. ) 540 tablet 3 07/20/2019 at Unknown time  . phenytoin (DILANTIN) 200 MG ER capsule Take 1 capsule (200 mg total) by mouth at bedtime. (Patient taking differently: Take 200 mg by mouth daily. ) 30 capsule 1 07/20/2019 at Unknown time  . predniSONE (DELTASONE) 5 MG tablet Take 1 mg by mouth daily.    07/20/2019 at Unknown time  . warfarin (COUMADIN) 2.5 MG tablet TAKE AS DIRECTED BY COUMADIN CLINIC. (Patient taking differently: Take 2.5 mg by  mouth See admin instructions. TAKE AS DIRECTED BY COUMADIN CLINIC.) 180 tablet 1 07/20/2019 at Unknown time  . folic acid (FOLVITE) 1 MG tablet Take 1 tablet (1 mg total) by mouth daily. (Patient not taking: Reported on 07/21/2019) 90 tablet 1 Not Taking at Unknown time  . pantoprazole (PROTONIX) 40 MG tablet Take 1 tablet (40 mg total) by mouth daily for 30 days. 30 tablet 0   . polyethylene glycol (MIRALAX / GLYCOLAX) 17 g packet Take 17 g by mouth daily as needed for mild constipation. (Patient not taking: Reported on 07/21/2019) 14 each 0 Not Taking at Unknown time    Assessment: [name] admitted with generalized weakness on warfarin at home for Afib. INR therapeutic on admission at 2.2, remains 2.2 today. H/H low, Plt wnl. Last dose of warfarin was on 4/30   Home warfarin regimen: 2.5 mg on Mon & Fri; 5 mg on all other days   Goal of Therapy:  INR 2-3 Monitor platelets by anticoagulation protocol: Yes   Plan:  -Continue home warfarin regimen as above. -Monitor daily INR -Monitor for bleeding   Marguerite Olea, [facility] Clinical Pharmacist Phone [phone]  07/22/2019 10:25 AM

## 2019-07-22 NOTE — Plan of Care (Signed)
  Problem: Clinical Measurements: Goal: Diagnostic test results will improve Outcome: Progressing   Problem: Safety: Goal: Ability to remain free from injury will improve Outcome: Progressing   Problem: Clinical Measurements: Goal: Ability to maintain clinical measurements within normal limits will improve Outcome: Progressing Goal: Respiratory complications will improve Outcome: Progressing Goal: Cardiovascular complication will be avoided Outcome: Progressing   Problem: Pain Managment: Goal: General experience of comfort will improve Outcome: Progressing

## 2019-07-22 NOTE — Evaluation (Signed)
Clinical/Bedside Swallow Evaluation Patient Details  Name: [name] MRN: [id number] Date of Birth: [birth year]  Today's Date: 07/22/2019 Time: SLP Start Time (ACUTE ONLY): 0910 SLP Stop Time (ACUTE ONLY): 0924 SLP Time Calculation (min) (ACUTE ONLY): 14 min  Past Medical History:  Past Medical History:  Diagnosis Date  . Hiatal hernia   . HTN (hypertension)    pt denies h/o HTN though it appears he was started on spironolactone during his hospitalization 3/13  . Pancreatitis   . Seizures ([place])   . Stroke ([place])   . Typical atrial flutter ([place]) 3/13   Past Surgical History:  Past Surgical History:  Procedure Laterality Date  . ESOPHAGOGASTRODUODENOSCOPY N/A 11/30/2015   Procedure: ESOPHAGOGASTRODUODENOSCOPY (EGD);  Surgeon: Wonda Horner, MD;  Location[facility] ENDOSCOPY;  Service: Endoscopy;  Laterality: N/A;  . ESOPHAGOGASTRODUODENOSCOPY (EGD) WITH PROPOFOL N/A 07/23/2018   Procedure: ESOPHAGOGASTRODUODENOSCOPY (EGD) WITH PROPOFOL;  Surgeon: Otis Brace, MD;  Location: [facility] ENDOSCOPY;  Service: Gastroenterology;  Laterality: N/A;  . FEMUR IM NAIL Left 12/08/2016  . FEMUR IM NAIL Left 12/07/2016   Procedure: INTRAMEDULLARY (IM) NAIL FEMORAL;  Surgeon: Renette Butters, MD;  Location: [place];  Service: Orthopedics;  Laterality: Left;  . GIVENS CAPSULE STUDY N/A 07/24/2018   Procedure: GIVENS CAPSULE STUDY;  Surgeon: Otis Brace, MD;  Location: [place];  Service: Gastroenterology;  Laterality: N/A;  . HIP SURGERY Left 01/2017  . HOT HEMOSTASIS N/A 07/23/2018   Procedure: HOT HEMOSTASIS (ARGON PLASMA COAGULATION/BICAP);  Surgeon: Otis Brace, MD;  Location[facility] ENDOSCOPY;  Service: Gastroenterology;  Laterality: N/A;  . no surgical history  06/2011   HPI:  Pt is an 79 y.o. male with medical history significant of hypertension, seizure disorder, stroke, atrial flutter-on Coumadin, dementia, GERD who presented to the ED due to generalized weakness. CXR 5/1: Mild  consolidative opacities at the left lung base may represent atelectasis, aspiration, or pneumonia.   Assessment / Plan / Recommendation Clinical Impression  Pt was seen for bedside swallow evaluation. Oral mechanism exam was limited due to pt's difficulty following commands; however, oral motor strength and ROM appeared grossly WFL. Dentition was adequate. Pt inconsistently demonstrated throat clearing following trials as has been demonstrated during prior sessions in 2013 and 2017. Mastication time was [facility] and no significant oral residue was noted. It is recommended that a regular texture diet be initiated at this time. SLP will follow to ensure tolerance of the recommended diet and to determine need for further intervention/assessment.  SLP Visit Diagnosis: Dysphagia, unspecified (R13.10)    Aspiration Risk  Mild aspiration risk    Diet Recommendation Regular;Thin liquid   Liquid Administration via: Cup;Straw Medication Administration: Crushed with puree Supervision: Patient able to self feed Compensations: Minimize environmental distractions;Slow rate Postural Changes: Seated upright at 90 degrees    Other  Recommendations Oral Care Recommendations: Oral care BID   Follow up Recommendations None      Frequency and Duration min 2x/week  1 week       Prognosis Prognosis for Safe Diet Advancement: Good Barriers to Reach Goals: Cognitive deficits      Swallow Study   General Date of Onset: 07/21/19 HPI: Pt is an 79 y.o. male with medical history significant of hypertension, seizure disorder, stroke, atrial flutter-on Coumadin, dementia, GERD who presented to the ED due to generalized weakness. CXR 5/1: Mild consolidative opacities at the left lung base may represent atelectasis, aspiration, or pneumonia. Type of Study: Bedside Swallow Evaluation Previous Swallow Assessment: BSE on 2017:  Intermittent throat clearing Diet Prior to this Study: NPO Temperature Spikes Noted:  No Respiratory Status: Room air History of Recent Intubation: No Behavior/Cognition: Alert;Cooperative;Confused Oral Cavity Assessment: Within Functional Limits Oral Care Completed by SLP: No Oral Cavity - Dentition: Adequate natural dentition Vision: Functional for self-feeding Self-Feeding Abilities: Able to feed self Patient Positioning: Upright in bed;Postural control adequate for testing Baseline Vocal Quality: Normal Volitional Cough: Cognitively unable to elicit Volitional Swallow: Able to elicit    Oral/Motor/Sensory Function Overall Oral Motor/Sensory Function: Within functional limits   Ice Chips Ice chips: Within functional limits Presentation: Spoon   Thin Liquid Thin Liquid: Within functional limits Presentation: Cup;Straw Other Comments: Intermittent throat clearing noted    Nectar Thick Nectar Thick Liquid: Not tested   Honey Thick Honey Thick Liquid: Not tested   Puree Puree: Within functional limits Presentation: Spoon   Solid     Solid: Within functional limits Other Comments: Intermittent throat clearing     [name] I. Hardin Negus, [place], [place] Office number [phone] Pager [phone]  Horton Marshall 07/22/2019,12:17 PM

## 2019-07-22 NOTE — Progress Notes (Signed)
PROGRESS NOTE  [name] [id number] DOB: 09-01-1940 DOA: 07/21/2019 PCP: [name], MD  HPI/Recap of past 24 hours:  [name] is a 79 y.o. male with medical history significant of hypertension, seizure disorder, stroke, atrial flutter-on Coumadin, dementia, GERD presents to emergency department due to generalized weakness.  Too weak to walk/perform daily life activities, he lives alone at home.  Has decreased appetite and has lost 20 pounds of weight in last 2 to 3 months.  Reports abdominal pain, nonbloody diarrhea since 2 to 3 weeks.   ED Course: Upon arrival to ED: Patient had fever of 102.3, tachycardic, tachypneic, CBC shows leukocytosis of 16,000, lactic acid: WNL, UA positive for rare bacteria, chest x-ray shows left lung opacity.  Urine culture, blood culture, COVID-19: Pending.  Foley was placed due to abdominal distention and urinary retention.  Patient started on IV Vanco and cefepime for the concern of sepsis.  [facility] hospital[facility] consulted for admission for sepsis secondary to pneumonia/UTI?  07/22/19: Seen and examined.  Alert but confused.  Some blood pressure is responding to IV fluid bolus.  On empiric IV antibiotics vancomycin and cefepime.  Seen by speech therapist, passed a bedside swallow evaluation.  Assessment/Plan: Principal Problem:   Sepsis ([place]) Active Problems:   Essential hypertension   Stroke (HCC)   Seizure disorder (HCC)   Paroxysmal atrial flutter (HCC)   Dementia (AD)   CAP (community acquired pneumonia)   Total bilirubin, elevated  Severe sepsis 2/2 to left lower lobe community-acquired pneumonia Presented with fever T-max 101.1, leukocytosis with WBC 16,000, procalcitonin greater than 14, tachycardia, tachypnea with chest x-ray suggestive of left lower lobe pneumonia. Started on empiric IV antibiotics vancomycin and cefepime, continue for now Obtain MRSA screening Procalcitonin significantly elevated 14 Has received IV  fluid boluses with improvement of his blood pressure Maintain MAP greater than 65 Obtain sputum culture Blood cultures have been obtained-negative to date, follow cultures UTI ruled out with negative urine culture.  Acute metabolic encephalopathy, likely multifactorial in the setting of acute illness, sepsis, hospital-acquired delirium Treat underlying conditions Reorient as needed  Anion gap metabolic acidosis Serum bicarb 20 and anion gap of 14 Continue to hydrate Repeat BMP in the morning  Hypomagnesemia Magnesium 1.5 Repleted with IV magnesium 4 g x 1 Goal magnesium 2.0  Hypokalemia Potassium 3.5 Repleted with oral potassium 40 mEq x 2 doses Potassium goal 4.0  Sinus tachycardia in the setting of sepsis and dehydration Obtain TSH Continue IV fluid Repeat twelve-lead EKG   Elevated troponin likely demand ischemia in the setting of tachycardia Troponin S peaked at 26 Denies any anginal symptoms No evidence of acute ischemia on twelve-lead EKG done on admission Continue to closely monitor  Hypovolemic hyponatremia Serum sodium slightly trending down Continue IV fluid and continue to monitor serum sodium level  Isolated hyper bilirubinemia Presented with total bilirubin over 2 Bilirubin is trending down Possible Gilbert syndrome  Chronic normocytic anemia/iron deficiency anemia Hemoglobin stable No overt bleeding Resume ferrous sulfate Continue to monitor H&H  History of CVA Continue Coumadin for secondary CVA prevention Goal INR between 2 and 3  Paroxysmal atrial flutter with RVR, rapid ventricular response has improved with IV fluid Not on rate control medications Continue Coumadin for secondary CVA prevention  History of seizures Continue AEDs home medication  GERD Stable continue home medications  Chronic prednisone use Resume  DVT prophylaxis:Coumadin/TED/SCD  Code Status: Full code  Family Communication: None present at bedside.    Disposition Plan:  Patient is from home.  Anticipate discharge to SNF.  Barriers to discharge: Ongoing treatment for severe sepsis on IV antibiotics.   Consults called: None     Objective: Vitals:   07/22/19 0713 07/22/19 0830 07/22/19 1000 07/22/19 1200  BP: (!) 96/57 (!) 96/50 (!) 91/47 100/74  Pulse: (!) 138 (!) 130  (!) 110  Resp: 19 20  (!) 25  Temp: (!) 101.1 F (38.4 C) 99.5 F (37.5 C) 98.7 F (37.1 C)   TempSrc: Oral Oral    SpO2: 98% 95%  95%  Weight:      Height:        Intake/Output Summary (Last 24 hours) at 07/22/2019 1331 Last data filed at 07/22/2019 1300 Gross per 24 hour  Intake 2712.69 ml  Output 250 ml  Net 2462.69 ml   Filed Weights   07/21/19 1009 07/22/19 0500  Weight: 59.9 kg 60.8 kg    Exam:  . General: 79 y.o. year-old male well developed well nourished in no acute distress.  Alert and interactive. . Cardiovascular: Tachycardic with no rubs or gallops.  No thyromegaly or JVD noted.   Marland Kitchen Respiratory: Mild rales at bases no wheezing noted.  Poor inspiratory effort.   . Abdomen: Soft nontender nondistended with normal bowel sounds x4 quadrants. . Musculoskeletal: No lower extremity edema bilaterally. Marland Kitchen Psychiatry: Mood is appropriate for condition and setting   Data Reviewed: CBC: Recent Labs  Lab 07/21/19 0951 07/22/19 0241  WBC 16.8* 16.4*  NEUTROABS 14.1*  --   HGB 10.6* 10.5*  HCT 33.0* 33.0*  MCV 93.0 94.0  PLT 234 [id number]   Basic Metabolic Panel: Recent Labs  Lab 07/21/19 0951 07/21/19 1742 07/22/19 0241  NA 138  --  134*  K 4.1  --  3.5  CL 99  --  100  CO2 27  --  20*  GLUCOSE 101*  --  100*  BUN 15  --  16  CREATININE 0.86  --  0.84  CALCIUM 9.1  --  8.3*  MG  --  1.5*  --   PHOS  --  3.1  --    GFR: Estimated Creatinine Clearance: 60.3 mL/min (by C-G formula based on SCr of 0.84 mg/dL). Liver Function Tests: Recent Labs  Lab 07/21/19 0951 07/22/19 0241  AST 27 32  ALT 14 13  ALKPHOS 83 73  BILITOT 2.4*  1.8*  PROT 7.4 6.6  ALBUMIN 4.2 3.5   No results for input(s): LIPASE, AMYLASE in the last 168 hours. No results for input(s): AMMONIA in the last 168 hours. Coagulation Profile: Recent Labs  Lab 07/21/19 0951 07/22/19 0241  INR 2.2* 2.2*   Cardiac Enzymes: No results for input(s): CKTOTAL, CKMB, CKMBINDEX, TROPONINI in the last 168 hours. BNP (last 3 results) No results for input(s): PROBNP in the last 8760 hours. HbA1C: No results for input(s): HGBA1C in the last 72 hours. CBG: Recent Labs  Lab 07/21/19 1743  GLUCAP 80   Lipid Profile: No results for input(s): CHOL, HDL, LDLCALC, TRIG, CHOLHDL, LDLDIRECT in the last 72 hours. Thyroid Function Tests: No results for input(s): TSH, T4TOTAL, FREET4, T3FREE, THYROIDAB in the last 72 hours. Anemia Panel: No results for input(s): VITAMINB12, FOLATE, FERRITIN, TIBC, IRON, RETICCTPCT in the last 72 hours. Urine analysis:    Component Value Date/Time   COLORURINE YELLOW 07/21/2019 1230   APPEARANCEUR CLEAR 07/21/2019 1230   LABSPEC 1.017 07/21/2019 1230   PHURINE 5.0 07/21/2019 1230   GLUCOSEU NEGATIVE 07/21/2019 1230  HGBUR LARGE (A) 07/21/2019 1230   BILIRUBINUR NEGATIVE 07/21/2019 1230   KETONESUR 20 (A) 07/21/2019 1230   PROTEINUR 30 (A) 07/21/2019 1230   UROBILINOGEN 0.2 06/02/2011 0300   NITRITE NEGATIVE 07/21/2019 1230   LEUKOCYTESUR NEGATIVE 07/21/2019 1230   Sepsis Labs: @LABRCNTIP (procalcitonin:4,lacticidven:4)  ) Recent Results (from the past 240 hour(s))  Blood Culture (routine x 2)     Status: None (Preliminary result)   Collection Time: 07/21/19  9:48 AM   Specimen: BLOOD LEFT HAND  Result Value Ref Range Status   Specimen Description BLOOD LEFT HAND  Final   Special Requests   Final    BOTTLES DRAWN AEROBIC AND ANAEROBIC Blood Culture results may not be optimal due to an inadequate volume of blood received in culture bottles   Culture   Final    NO GROWTH < 24 HOURS Performed at [facility], [place] [address] [place], [place] [address]    Report Status PENDING  Incomplete  Blood Culture (routine x 2)     Status: None (Preliminary result)   Collection Time: 07/21/19  9:51 AM   Specimen: BLOOD  Result Value Ref Range Status   Specimen Description BLOOD RIGHT ANTECUBITAL  Final   Special Requests   Final    BOTTLES DRAWN AEROBIC ONLY Blood Culture results may not be optimal due to an inadequate volume of blood received in culture bottles   Culture   Final    NO GROWTH < 24 HOURS Performed at [facility], [place] [address] [place], [place] [address]    Report Status PENDING  Incomplete  Respiratory Panel by RT PCR (Flu A&B, Covid) - Nasopharyngeal Swab     Status: None   Collection Time: 07/21/19 12:25 PM   Specimen: Nasopharyngeal Swab  Result Value Ref Range Status   SARS Coronavirus 2 by RT PCR NEGATIVE NEGATIVE Final    Comment: (NOTE) SARS-CoV-2 target nucleic acids are NOT DETECTED. The SARS-CoV-2 RNA is generally detectable in upper respiratoy specimens during the acute phase of infection. The lowest concentration of SARS-CoV-2 viral copies this assay can detect is 131 copies/mL. A negative result does not preclude SARS-Cov-2 infection and should not be used as the sole basis for treatment or other patient management decisions. A negative result may occur with  improper specimen collection/handling, submission of specimen other than nasopharyngeal swab, presence of viral mutation(s) within the areas targeted by this assay, and inadequate number of viral copies (<131 copies/mL). A negative result must be combined with clinical observations, patient history, and epidemiological information. The expected result is Negative. Fact Sheet for Patients:  PinkCheek.be Fact Sheet for Healthcare Providers:  GravelBags.it This test is not yet ap proved or cleared by the [place] FDA and  has been authorized  for detection and/or diagnosis of SARS-CoV-2 by FDA under an Emergency Use Authorization (EUA). This EUA will remain  in effect (meaning this test can be used) for the duration of the COVID-19 declaration under Section 564(b)(1) of the Act, 21 U.S.C. section 360bbb-3(b)(1), unless the authorization is terminated or revoked sooner.    Influenza A by PCR NEGATIVE NEGATIVE Final   Influenza B by PCR NEGATIVE NEGATIVE Final    Comment: (NOTE) The Xpert Xpress SARS-CoV-2/FLU/RSV assay is intended as an aid in  the diagnosis of influenza from Nasopharyngeal swab specimens and  should not be used as a sole basis for treatment. Nasal washings and  aspirates are unacceptable for Xpert Xpress SARS-CoV-2/FLU/RSV  testing. Fact  Sheet for Patients: PinkCheek.be Fact Sheet for Healthcare Providers: GravelBags.it This test is not yet approved or cleared by the [place] FDA and  has been authorized for detection and/or diagnosis of SARS-CoV-2 by  FDA under an Emergency Use Authorization (EUA). This EUA will remain  in effect (meaning this test can be used) for the duration of the  Covid-19 declaration under Section 564(b)(1) of the Act, 21  U.S.C. section 360bbb-3(b)(1), unless the authorization is  terminated or revoked. Performed at [facility], [place] [address] [place], [place] [address]   Urine culture     Status: None   Collection Time: 07/21/19  1:13 PM   Specimen: In/Out Cath Urine  Result Value Ref Range Status   Specimen Description IN/OUT CATH URINE  Final   Special Requests NONE  Final   Culture   Final    NO GROWTH Performed at [facility], [place] [address] [place], [place] [address]    Report Status 07/22/2019 FINAL  Final  MRSA PCR Screening     Status: None   Collection Time: 07/21/19  7:53 PM   Specimen: Nasopharyngeal  Result Value Ref Range Status   MRSA by PCR NEGATIVE NEGATIVE Final    Comment:         The GeneXpert MRSA Assay (FDA approved for NASAL specimens only), is one component of a comprehensive MRSA colonization surveillance program. It is not intended to diagnose MRSA infection nor to guide or monitor treatment for MRSA infections. Performed at [facility], [place] [address], [place], [place] [address]       Studies: CT ABDOMEN PELVIS W CONTRAST  Result Date: 07/21/2019 CLINICAL DATA:  Diffuse abdominal pain and fever. Generalized weakness. Provided history states postop, however surgery not specified, and review of electronic medical records does not indicate recent surgery EXAM: CT ABDOMEN AND PELVIS WITH CONTRAST TECHNIQUE: Multidetector CT imaging of the abdomen and pelvis was performed using the standard protocol following bolus administration of intravenous contrast. CONTRAST:  132mL OMNIPAQUE IOHEXOL 300 MG/ML  SOLN COMPARISON:  Abdominopelvic CT 07/21/2018 FINDINGS: Lower chest: Small bilateral pleural effusions, left greater than right. Associated atelectasis. Small pericardial effusion, unchanged from prior exam. Hepatobiliary: Innumerable hepatic granuloma. No discrete focal abnormality. Gallbladder is decompressed. No biliary dilatation. Pancreas: Parenchymal atrophy. No ductal dilatation or inflammation. Spleen: Multiple splenic granuloma. Normal in size. Adrenals/Urinary Tract: Normal adrenal glands. No hydronephrosis or perinephric edema. Homogeneous renal enhancement with symmetric excretion on delayed phase imaging. Small subcentimeter low-density lesions in the kidneys are too small to characterize but likely cysts. Foley catheter decompresses the urinary bladder. There is mild diffuse bladder wall thickening and perivesicular fat stranding. Stomach/Bowel: Nondistended stomach. No small bowel obstruction or inflammation. Administered enteric contrast reaches the cecum. The appendix is normal. Air in stool throughout the colon. There is sigmoid and transverse  colonic redundancy. Scattered diverticulosis without focal diverticulitis. There is wall thickening at the splenic flexure of the colon, series 3, image 15. no adjacent pericolonic edema. Vascular/Lymphatic: Aortic atherosclerosis. No aortic aneurysm. The portal vein is patent. Circumaortic left renal vein. Calcified upper mediastinal lymph nodes consistent with prior granulomatous disease. No noncalcified adenopathy. Reproductive: Prostate is unremarkable. Other: No free air, free fluid, or intra-abdominal fluid collection. There is fat in the inguinal canals. Musculoskeletal: Surgical hardware in the left proximal femur with remote femur fracture. Moderate bilateral osteoarthritis of both hips. Degenerative change throughout the spine. Bones are diffusely under mineralized. There are no acute or  suspicious osseous abnormalities. IMPRESSION: 1. Mild diffuse bladder wall thickening and perivesicular fat stranding, can be seen with urinary tract infection. Foley catheter decompresses the urinary bladder. 2. There is mild wall thickening at the splenic flexure of the colon without adjacent inflammation. This is nonspecific, and may be related to peristalsis, however underlying neoplasm is not excluded. Consider follow-up colonoscopy. Significant colonic tortuosity and redundancy with moderate stool burden. Multifocal diverticulosis without diverticulitis. 3. Small bilateral pleural effusions, left greater than right. Small pericardial effusion which is unchanged. Aortic Atherosclerosis (ICD10-I70.0). Electronically Signed   By: Keith Rake [name]   On: 07/21/2019 17:58    Scheduled Meds: . Chlorhexidine Gluconate Cloth  6 each Topical Daily  . levETIRAcetam  1,500 mg Oral BID  . phenytoin  200 mg Oral Daily  . potassium chloride  40 mEq Oral BID  . [START ON 07/23/2019] warfarin  2.5 mg Oral Once per day on Mon Fri  . warfarin  5 mg Oral Once per day on Sun Tue Wed Thu Sat  . Warfarin - Pharmacist Dosing  Inpatient   Does not apply q1600    Continuous Infusions: . sodium chloride 100 mL/hr at 07/21/19 1800  . amiodarone 30 mg/hr (07/22/19 1000)  . ceFEPime (MAXIPIME) IV 2 g (07/22/19 1006)  . sodium chloride    . vancomycin 500 mg (07/22/19 1200)     LOS: 1 day     Kayleen Memos, MD [facility] Pager [phone]  If 7PM-7AM, please contact night-coverage [email or web address] Password [email or web address] 07/22/2019, 1:31 PM

## 2019-07-23 DIAGNOSIS — I10 Essential (primary) hypertension: Secondary | ICD-10-CM | POA: Diagnosis not present

## 2019-07-23 DIAGNOSIS — I4891 Unspecified atrial fibrillation: Secondary | ICD-10-CM

## 2019-07-23 DIAGNOSIS — I4510 Unspecified right bundle-branch block: Secondary | ICD-10-CM | POA: Diagnosis not present

## 2019-07-23 DIAGNOSIS — R778 Other specified abnormalities of plasma proteins: Secondary | ICD-10-CM

## 2019-07-23 DIAGNOSIS — A021 Salmonella sepsis: Secondary | ICD-10-CM

## 2019-07-23 LAB — CBC WITH DIFFERENTIAL/PLATELET
Abs Immature Granulocytes: 0.09 10*3/uL — ABNORMAL HIGH (ref 0.00–0.07)
Basophils Absolute: 0 10*3/uL (ref 0.0–0.1)
Basophils Relative: 0 %
Eosinophils Absolute: 0 10*3/uL (ref 0.0–0.5)
Eosinophils Relative: 0 %
HCT: 26.5 % — ABNORMAL LOW (ref 39.0–52.0)
Hemoglobin: 8.7 g/dL — ABNORMAL LOW (ref 13.0–17.0)
Immature Granulocytes: 1 %
Lymphocytes Relative: 7 %
Lymphs Abs: 0.8 10*3/uL (ref 0.7–4.0)
MCH: 30.3 pg (ref 26.0–34.0)
MCHC: 32.8 g/dL (ref 30.0–36.0)
MCV: 92.3 fL (ref 80.0–100.0)
Monocytes Absolute: 1.6 10*3/uL — ABNORMAL HIGH (ref 0.1–1.0)
Monocytes Relative: 14 %
Neutro Abs: 8.5 10*3/uL — ABNORMAL HIGH (ref 1.7–7.7)
Neutrophils Relative %: 78 %
Platelets: 203 10*3/uL (ref 150–400)
RBC: 2.87 MIL/uL — ABNORMAL LOW (ref 4.22–5.81)
RDW: 24.7 % — ABNORMAL HIGH (ref 11.5–15.5)
WBC: 10.8 10*3/uL — ABNORMAL HIGH (ref 4.0–10.5)
nRBC: 0.4 % — ABNORMAL HIGH (ref 0.0–0.2)

## 2019-07-23 LAB — BASIC METABOLIC PANEL
Anion gap: 7 (ref 5–15)
BUN: 14 mg/dL (ref 8–23)
CO2: 21 mmol/L — ABNORMAL LOW (ref 22–32)
Calcium: 7.7 mg/dL — ABNORMAL LOW (ref 8.9–10.3)
Chloride: 107 mmol/L (ref 98–111)
Creatinine, Ser: 0.75 mg/dL (ref 0.61–1.24)
GFR calc Af Amer: >60 mL/min (ref >60)
GFR calc non Af Amer: >60 mL/min (ref >60)
Glucose, Bld: 138 mg/dL — ABNORMAL HIGH (ref 70–99)
Potassium: 4.1 mmol/L (ref 3.5–5.1)
Sodium: 135 mmol/L (ref 135–145)

## 2019-07-23 LAB — LEGIONELLA PNEUMOPHILA SEROGP 1 UR AG: L. pneumophila Serogp 1 Ur Ag: NEGATIVE

## 2019-07-23 LAB — TSH: TSH: 1.333 u[IU]/mL (ref 0.350–4.500)

## 2019-07-23 LAB — PROCALCITONIN: Procalcitonin: 10.08 ng/mL

## 2019-07-23 LAB — PROTIME-INR
INR: 3.4 — ABNORMAL HIGH (ref 0.8–1.2)
Prothrombin Time: 33.2 seconds — ABNORMAL HIGH (ref 11.4–15.2)

## 2019-07-23 LAB — MAGNESIUM: Magnesium: 2 mg/dL (ref 1.7–2.4)

## 2019-07-23 MED ORDER — METOPROLOL TARTRATE 12.5 MG HALF TABLET
12.5000 mg | ORAL_TABLET | Freq: Two times a day (BID) | ORAL | Status: DC
  Filled 2019-07-23: qty 1

## 2019-07-23 MED ORDER — DIGOXIN 125 MCG PO TABS
0.2500 mg | ORAL_TABLET | Freq: Once | ORAL | Status: AC
  Administered 2019-07-23: 0.25 mg via ORAL
  Filled 2019-07-23: qty 2

## 2019-07-23 MED ORDER — ADULT MULTIVITAMIN W/MINERALS CH
1.0000 | ORAL_TABLET | Freq: Every day | ORAL | Status: DC
  Administered 2019-07-23 – 2019-07-27 (×5): 1 via ORAL
  Filled 2019-07-23 (×5): qty 1

## 2019-07-23 MED ORDER — AMIODARONE IV BOLUS ONLY 150 MG/100ML
150.0000 mg | Freq: Once | INTRAVENOUS | Status: AC
  Administered 2019-07-23: 150 mg via INTRAVENOUS
  Filled 2019-07-23: qty 100

## 2019-07-23 MED ORDER — ENSURE ENLIVE PO LIQD
237.0000 mL | Freq: Three times a day (TID) | ORAL | Status: DC
  Administered 2019-07-23 – 2019-07-27 (×11): 237 mL via ORAL

## 2019-07-23 MED ORDER — AMIODARONE LOAD VIA INFUSION
150.0000 mg | Freq: Once | INTRAVENOUS | Status: DC
  Filled 2019-07-23: qty 83.34

## 2019-07-23 NOTE — Consult Note (Addendum)
Admit date: 07/21/2019 Referring Physician  Irene Pap, MD Primary Physician  [name], MD Primary Cardiologist  [name], MD Reason for Consultation  Atrial fibrillation with RVR  HPI: [name] is a 79 y.o. male who is being seen today for the evaluation of atrial fibrillation with RVR at the request of Francia Greaves, DO.    This is an 79yo male with a hx of HTN, dementia, Atrial flutter (see by Dr. Rayann Heman in the past) and CVA.  When he last saw Dr. Rayann Heman in 2018, the patient was not interested in ablation of atrial flutter and given LAE, it was felt that there was a high likelihood of afib even with aflutter ablation.  He has been on chronic coumadin (no DOAC due to phenytoin interaction).  He never followed up in afib clinic after 2018.    He was in his USOH until 5/1 when he presented to [name] with complaints of weakness, decreased appeteite and 20lb weight loss over several months.  He also has been having abdominal pain and diarrhea for 2-3 weeks.  In EF was febrile to 102, tachycardic, COVID neg, and dx with Left lung opacity and sepsis from probable UTI and PNA.  He was started on antibx.  EKG showed sinus tachycardia and RBBB. It was felt that his tachycardia was related to sepsis, fever and dehydration and improved with IVF.  On the evening of admission he went into atrial fibrillation with RVR in the 130's. Of note last EKG 09/2018 showed NSR with RBBB.  Cardiology is now asked to consult regarding help with rate control.    The patient is a poor historian and difficult to get any answers out of.  When asked if he had chest pain, he replied that his whole body was in pain.  He later denied chest pain or SOB but complains of abdominal pain.  He denies any palpitations despite being in atrial flutter/fib with HR in the 130's.     PMH:   Past Medical History:  Diagnosis Date  . Hiatal hernia   . HTN (hypertension)    pt denies h/o HTN though it appears he was started  on spironolactone during his hospitalization 3/13  . Pancreatitis   . Seizures ([place])   . Stroke ([place])   . Typical atrial flutter ([place]) 3/13     PSH:   Past Surgical History:  Procedure Laterality Date  . ESOPHAGOGASTRODUODENOSCOPY N/A 11/30/2015   Procedure: ESOPHAGOGASTRODUODENOSCOPY (EGD);  Surgeon: Wonda Horner, MD;  Location: [facility] ENDOSCOPY;  Service: Endoscopy;  Laterality: N/A;  . ESOPHAGOGASTRODUODENOSCOPY (EGD) WITH PROPOFOL N/A 07/23/2018   Procedure: ESOPHAGOGASTRODUODENOSCOPY (EGD) WITH PROPOFOL;  Surgeon: Otis Brace, MD;  Location: [facility] ENDOSCOPY;  Service: Gastroenterology;  Laterality: N/A;  . FEMUR IM NAIL Left 12/08/2016  . FEMUR IM NAIL Left 12/07/2016   Procedure: INTRAMEDULLARY (IM) NAIL FEMORAL;  Surgeon: Renette Butters, MD;  Location: [place];  Service: Orthopedics;  Laterality: Left;  . GIVENS CAPSULE STUDY N/A 07/24/2018   Procedure: GIVENS CAPSULE STUDY;  Surgeon: Otis Brace, MD;  Location: [place];  Service: Gastroenterology;  Laterality: N/A;  . HIP SURGERY Left 01/2017  . HOT HEMOSTASIS N/A 07/23/2018   Procedure: HOT HEMOSTASIS (ARGON PLASMA COAGULATION/BICAP);  Surgeon: Otis Brace, MD;  Location: [facility] ENDOSCOPY;  Service: Gastroenterology;  Laterality: N/A;  . no surgical history  06/2011    Allergies:  Patient has no known allergies. Prior to Admit Meds:   Medications Prior to Admission  Medication Sig Dispense Refill Last Dose  . ferrous sulfate 325 (65 FE) MG tablet Take 1 tablet (325 mg total) by mouth 3 (three) times daily with meals. (Patient taking differently: Take 325 mg by mouth daily with breakfast. ) 60 tablet 0 07/20/2019 at Unknown time  . levETIRAcetam (KEPPRA) 500 MG tablet TAKE 3 TABLETS BY MOUTH TWICE A DAY (Patient taking differently: Take 1,500 mg by mouth 2 (two) times daily. ) 540 tablet 3 07/20/2019 at Unknown time  . phenytoin (DILANTIN) 200 MG ER capsule Take 1 capsule (200 mg total) by mouth at bedtime. (Patient taking  differently: Take 200 mg by mouth daily. ) 30 capsule 1 07/20/2019 at Unknown time  . predniSONE (DELTASONE) 5 MG tablet Take 1 mg by mouth daily.    07/20/2019 at Unknown time  . warfarin (COUMADIN) 2.5 MG tablet TAKE AS DIRECTED BY COUMADIN CLINIC. (Patient taking differently: Take 2.5 mg by mouth See admin instructions. TAKE AS DIRECTED BY COUMADIN CLINIC.) 180 tablet 1 07/20/2019 at Unknown time  . folic acid (FOLVITE) 1 MG tablet Take 1 tablet (1 mg total) by mouth daily. (Patient not taking: Reported on 07/21/2019) 90 tablet 1 Not Taking at Unknown time  . pantoprazole (PROTONIX) 40 MG tablet Take 1 tablet (40 mg total) by mouth daily for 30 days. 30 tablet 0   . polyethylene glycol (MIRALAX / GLYCOLAX) 17 g packet Take 17 g by mouth daily as needed for mild constipation. (Patient not taking: Reported on 07/21/2019) 14 each 0 Not Taking at Unknown time   Fam HX:    Family History  Problem Relation Age of Onset  . Hypertension Other    Social HX:    Social History   Socioeconomic History  . Marital status: Divorced    Spouse name: Not on file  . Number of children: Not on file  . Years of education: Not on file  . Highest education level: Not on file  Occupational History  . Not on file  Tobacco Use  . Smoking status: Former Smoker    Quit date: 07/15/1966    Years since quitting: 53.0  . Smokeless tobacco: Never Used  Substance and Sexual Activity  . Alcohol use: No    Comment: Quit greater than 5 years ago  . Drug use: No  . Sexual activity: Not on file  Other Topics Concern  . Not on file  Social History Narrative   Lives alone.  Retired from Actor   Social Determinants of Radio broadcast assistant Strain:   . Difficulty of Paying Living Expenses:   Food Insecurity:   . Worried About Charity fundraiser in the Last Year:   . Arboriculturist in the Last Year:   Transportation Needs:   . Film/video editor (Medical):   Marland Kitchen Lack of Transportation  (Non-Medical):   Physical Activity:   . Days of Exercise per Week:   . Minutes of Exercise per Session:   Stress:   . Feeling of Stress :   Social Connections:   . Frequency of Communication with Friends and Family:   . Frequency of Social Gatherings with Friends and Family:   . Attends Religious Services:   . Active Member of Clubs or Organizations:   . Attends Archivist Meetings:   Marland Kitchen Marital Status:   Intimate Partner Violence:   . Fear of Current or Ex-Partner:   . Emotionally Abused:   Marland Kitchen Physically Abused:   .  Sexually Abused:      ROS:  All  ROS were addressed and are negative except what is stated in the HPI  Physical Exam: Blood pressure (!) 149/72, pulse (!) 123, temperature (!) 101 F (38.3 C), temperature source Oral, resp. rate (!) 28, height 6\' 1"  (1.854 m), weight 64.2 kg, SpO2 94 %.    General: Well developed, well nourished, in no acute distress Head: Eyes PERRLA, No xanthomas.   Normal cephalic and atramatic  Lungs:   Clear bilaterally to auscultation and percussion. Heart:   HRRR S1 S2 Pulses are 2+ & equal.            No carotid bruit. No JVD.  No abdominal bruits. No femoral bruits. Abdomen: Bowel sounds are positive, abdomen soft and non-tender without masses or                  Hernia's noted. Msk:  Back normal, normal gait. Normal strength and tone for age. Extremities:   No clubbing, cyanosis or edema.  DP +1 Neuro: Alert and oriented X 3. Psych:  Good affect, responds appropriately    Labs:   Lab Results  Component Value Date   WBC 10.8 (H) 07/23/2019   HGB 8.7 (L) 07/23/2019   HCT 26.5 (L) 07/23/2019   MCV 92.3 07/23/2019   PLT 203 07/23/2019    Recent Labs  Lab 07/22/19 0241 07/22/19 0241 07/23/19 0239  NA 134*   < > 135  K 3.5   < > 4.1  CL 100   < > 107  CO2 20*   < > 21*  BUN 16   < > 14  CREATININE 0.84   < > 0.75  CALCIUM 8.3*   < > 7.7*  PROT 6.6  --   --   BILITOT 1.8*  --   --   ALKPHOS 73  --   --   ALT 13   --   --   AST 32  --   --   GLUCOSE 100*   < > 138*   < > = values in this interval not displayed.   No results found for: PTT Lab Results  Component Value Date   INR 3.4 (H) 07/23/2019   INR 2.2 (H) 07/22/2019   INR 2.2 (H) 07/21/2019   Lab Results  Component Value Date   CKTOTAL 1,971 (H) 10/13/2007   CKMB 13.3 (H) 10/13/2007   TROPONINI <0.03 11/27/2015     Lab Results  Component Value Date   CHOL 156 06/04/2011   Lab Results  Component Value Date   HDL 97 06/04/2011   Lab Results  Component Value Date   LDLCALC 42 06/04/2011   Lab Results  Component Value Date   TRIG 86 06/04/2011   Lab Results  Component Value Date   CHOLHDL 1.6 06/04/2011   No results found for: LDLDIRECT    Radiology:  No results found.   Telemetry    Atrial fibrillation/flutter with HRs in the 130's - Personally Reviewed  ECG    Atrial fibrillation with RVR and RBBB - Personally Reviewed   ASSESSMENT/PLAN:    1.  Persistent atrial fibrillation/flutter with RVR -HR remains in the 130's since 5/1 -he was last in NSR in July 2020. -completely asymptomatic at this time -started on IV Amio for rate control on 5.2 and given a dose of IV dig this evening without improvement in HR -INR -check TSH especially in light of recent weight loss and diarrhea -  HR should improve with treatment of underlying sepsis -BP too soft for addition of BB or CCB -INRs have been therapeutic since 02/2019 -will rebolus with Amio 150mg  IV now and continue gtt -Mag 2.0 and K+ 4.1 today -check 2D echo -consider DCCV but would not do this until we know he does not need any procedures (abnormal abd CT and ongoing abdominal pain and firm abdomen).    2.  Sepsis -secondary to PNA, UTI and possible enteritis -now on IV antibx per TRH -abdominal CT showed multifocal diverticulosis without diverticulitis, colonic wall thickening without inflammation and diffuse bladder wall thickening seen with UTIs.  ?  Underlying neoplasm in splenic flexure -abdomen is firm and diffusely tender -consider GI consult  3. Elevated trop -minimally elevated with flat trend (18>26) -2D echo pending -suspect this is demand ischemia in the setting of sepsis and tachycardia  4.  HTN -BP controlled and actually on the soft side -he has not required any antihypertensive therapy recently    Fransico Him, MD  07/23/2019  6:53 PM

## 2019-07-23 NOTE — Evaluation (Signed)
Physical Therapy Evaluation Patient Details Name: [name] MRN: [id number] DOB: Jul 17, 1940 Today's Date: 07/23/2019   History of Present Illness  79 yo admittted with weakness with sepsis due to PNA with metabolic encephalopathy. PMHx: HTN, Sz, CVA, Aflutter, dementia  Clinical Impression  Pt willing to mobilize and participate pt irritated with questions. Pt reports living alone without significant assist at home and being able to walk until immediately PTA. Pt with generalized weakness but unable to formally assess due to pt inability to consistently follow commands for myotome testing. Pt with decreased functional mobility, safety and balance who requires assist for all mobility and SNF appropriate.     Follow Up Recommendations Supervision/Assistance - 24 hour    Equipment Recommendations  Rolling walker with 5" wheels    Recommendations for Other Services OT consult     Precautions / Restrictions Precautions Precautions: Fall      Mobility  Bed Mobility Overal bed mobility: Needs Assistance Bed Mobility: Supine to Sit     Supine to sit: Min assist;HOB elevated     General bed mobility comments: HOb 30 degrees with multimodal cueing, increased time and assist to fully elevate trunk  Transfers Overall transfer level: Needs assistance   Transfers: Sit to/from Stand Sit to Stand: Min assist         General transfer comment: min assist with cues for hand placement to rise from surface  Ambulation/Gait Ambulation/Gait assistance: +2 safety/equipment;Mod assist Gait Distance (Feet): 30 Feet Assistive device: Rolling walker (2 wheeled) Gait Pattern/deviations: Step-through pattern;Decreased stride length;Trunk flexed   Gait velocity interpretation: 1.31 - 2.62 ft/sec, indicative of limited community ambulator General Gait Details: pt with impulsive gait stepping past wheels of walker, picking up walker at times, inability to follow commands for posture, RW  use and safety. chair follow with pt reaching for chair to sit without stating fatigue  Stairs            Wheelchair Mobility    Modified Rankin (Stroke Patients Only)       Balance Overall balance assessment: Needs assistance   Sitting balance-Leahy Scale: Fair     Standing balance support: Bilateral upper extremity supported Standing balance-Leahy Scale: Poor                               Pertinent Vitals/Pain Pain Assessment: No/denies pain    Home Living Family/patient expects to be discharged to:: Private residence Living Arrangements: Alone Available Help at Discharge: Family;Available PRN/intermittently Type of Home: House Home Access: Stairs to enter   Entrance Stairs-Number of Steps: 1   Home Equipment: None      Prior Function           Comments: pt reports he was taking care of himself, per prior admission doesnt drive with assist for groceries     Hand Dominance        Extremity/Trunk Assessment   Upper Extremity Assessment Upper Extremity Assessment: Generalized weakness    Lower Extremity Assessment Lower Extremity Assessment: Generalized weakness    Cervical / Trunk Assessment Cervical / Trunk Assessment: Kyphotic  Communication   Communication: HOH  Cognition Arousal/Alertness: Awake/alert Behavior During Therapy: WFL for tasks assessed/performed Overall Cognitive Status: Impaired/Different from baseline Area of Impairment: Orientation;Attention;Memory;Following commands;Safety/judgement;Problem solving                 Orientation Level: Disoriented to;Time;Situation Current Attention Level: Sustained Memory: Decreased short-term memory Following Commands: Follows one  step commands inconsistently Safety/Judgement: Decreased awareness of safety;Decreased awareness of deficits   Problem Solving: Difficulty sequencing;Requires verbal cues;Requires tactile cues General Comments: pt easily irritated with  questions and unable to provide complete PLOF. Pt without awareness of deficits with jerking manipulation of RW with gait and difficulty following mod cues      General Comments      Exercises     Assessment/Plan    PT Assessment Patient needs continued PT services  PT Problem List Decreased strength;Decreased mobility;Decreased activity tolerance;Decreased balance;Decreased knowledge of use of DME;Decreased cognition;Decreased safety awareness       PT Treatment Interventions DME instruction;Therapeutic exercise;Gait training;Balance training;Functional mobility training;Cognitive remediation;Therapeutic activities;Patient/family education    PT Goals (Current goals can be found in the Care Plan section)  Acute Rehab PT Goals Patient Stated Goal: be able to walk and get better PT Goal Formulation: With patient Time For Goal Achievement: 08/06/19 Potential to Achieve Goals: Fair    Frequency Min 2X/week   Barriers to discharge Decreased caregiver support      Co-evaluation               AM-PAC PT "6 Clicks" Mobility  Outcome Measure Help needed turning from your back to your side while in a flat bed without using bedrails?: A Little Help needed moving from lying on your back to sitting on the side of a flat bed without using bedrails?: A Little Help needed moving to and from a bed to a chair (including a wheelchair)?: A Little Help needed standing up from a chair using your arms (e.g., wheelchair or bedside chair)?: A Little Help needed to walk in hospital room?: A Lot Help needed climbing 3-5 steps with a railing? : Total 6 Click Score: 15    End of Session Equipment Utilized During Treatment: Gait belt Activity Tolerance: Patient tolerated treatment well Patient left: in chair;with call bell/phone within reach;with chair alarm set Nurse Communication: Mobility status PT Visit Diagnosis: Other abnormalities of gait and mobility (R26.89);Difficulty in walking,  not elsewhere classified (R26.2);Muscle weakness (generalized) (M62.81)    Time: 0802-0821 PT Time Calculation (min) (ACUTE ONLY): 19 min   Charges:   PT Evaluation $PT Eval Moderate Complexity: 1 Mod          Isao Seltzer P, PT Acute Rehabilitation Services Pager: [phone] Office: [place] 07/23/2019, 12:16 PM

## 2019-07-23 NOTE — Progress Notes (Signed)
PROGRESS NOTE  [name] Agent [id number] DOB: 1941-02-29 DOA: 07/21/2019 PCP: [name], MD  HPI/Recap of past 24 hours:  [name] is a 79 y.o. male with medical history significant of hypertension, seizure disorder, stroke, atrial flutter-on Coumadin, dementia, GERD presents to emergency department due to generalized weakness.  Too weak to walk/perform daily life activities, he lives alone at home.  Has decreased appetite and has lost 20 pounds of weight in last 2 to 3 months.  Reports abdominal pain, nonbloody diarrhea since 2 to 3 weeks.   ED Course: Upon arrival to ED: Patient had fever of 102.3, tachycardic, tachypneic, CBC shows leukocytosis of 16,000, lactic acid: WNL, UA positive for rare bacteria, chest x-ray shows left lung opacity.  Urine culture, blood culture, COVID-19: Negative.  Foley was placed due to abdominal distention and urinary retention.  Patient started on IV Vanco and cefepime for the concern of sepsis.  [facility] hospitalist consulted for admission for sepsis secondary to pneumonia.  07/23/19: Seen and examined.  More alert today sitting up in a chair.  States he does not feel well but is unable to elaborate further.  He denies any chest pain, cough, dyspnea, or abdominal symptoms.   Assessment/Plan: Principal Problem:   Sepsis ([place]) Active Problems:   Essential hypertension   Stroke (HCC)   Seizure disorder (HCC)   Paroxysmal atrial flutter (HCC)   Dementia (AD)   CAP (community acquired pneumonia)   Total bilirubin, elevated  Severe sepsis, resolving, 2/2 to left lower lobe community-acquired pneumonia Presented with fever T-max 101.1, leukocytosis with WBC 16,000, procalcitonin greater than 14, tachycardia, tachypnea with chest x-ray suggestive of left lower lobe pneumonia. Received 2 days of IV vancomycin, MRSA screen negative, discontinue vancomycin. Continue cefepime day #2 Procalcitonin is trending down, repeat a level in the  morning Blood cultures are negative to date Urine culture was negative. Continue to follow cultures  Acute metabolic encephalopathy, likely multifactorial in the setting of acute illness, sepsis, hospital-acquired delirium Continue to treat underlying conditions Reorient as needed  Physical debility PT OT to assess Continue fall precautions  Improving anion gap metabolic acidosis Continue to hydrate Repeat BMP in the morning  Resolved post repletion: Hypomagnesemia Magnesium 2.0  Resolved post repletion: Hypokalemia Potassium 4.1  Resolved sinus tachycardia in the setting of sepsis and dehydration TSH was normal Continue IV fluid  Elevated troponin likely demand ischemia in the setting of tachycardia Troponin S peaked at 26 Denies any anginal symptoms No evidence of acute ischemia on twelve-lead EKG done on admission  Resolved post IV fluid: Hypovolemic hyponatremia  serum sodium 135  Isolated hyper bilirubinemia Presented with total bilirubin over 2 Bilirubin is trending down Possible Gilbert syndrome  Chronic normocytic anemia/iron deficiency anemia Hemoglobin stable No overt bleeding Continue ferrous sulfate Continue to monitor H&H  History of CVA Continue Coumadin for secondary CVA prevention Goal INR between 2 and 3  Paroxysmal atrial flutter with RVR, rapid ventricular response has improved with IV fluid Not on rate control medications On Coumadin for CVA prevention Hold off Coumadin due to supratherapeutic INR  Supratherapeutic INR INR 3.4 Hold Coumadin today and repeat INR in the morning Managed by pharmacy Appreciate pharmacy's assistance  History of seizures No seizures reported since this admission Continue AEDs home medication  GERD Stable continue home medications  Chronic prednisone use Resume  DVT prophylaxis:Coumadin/TED/SCD  Code Status: Full code  Family Communication: None present at bedside.   Disposition Plan:  Patient  is from home.  Anticipate discharge in the next 48-72 hours pending clinical improvement and PT OT evaluation..  Barriers to discharge: Ongoing treatment for pneumonia on IV antibiotics.   Consults called: None     Objective: Vitals:   07/23/19 0505 07/23/19 0530 07/23/19 0600 07/23/19 0730  BP: (!) 103/59   112/68  Pulse: 90   91  Resp: 18   (!) 24  Temp: 98.8 F (37.1 C)   99.7 F (37.6 C)  TempSrc: Oral   Axillary  SpO2: 90%  97% 97%  Weight:  64.2 kg    Height:        Intake/Output Summary (Last 24 hours) at 07/23/2019 1045 Last data filed at 07/23/2019 1031 Gross per 24 hour  Intake 3546.42 ml  Output 925 ml  Net 2621.42 ml   Filed Weights   07/21/19 1009 07/22/19 0500 07/23/19 0530  Weight: 59.9 kg 60.8 kg 64.2 kg    Exam:  . General: 79 y.o. year-old male well-developed well-nourished in no acute distress.  Alert and interactive.   . Cardiovascular: Regular rate and rhythm no rubs or gallops. Marland Kitchen Respiratory: Mild rales at bases no wheezing noted.   . Abdomen: Soft nontender normal bowel sounds present. . Musculoskeletal: No lower extremity edema bilaterally.   Marland Kitchen Psychiatry: Mood is appropriate for condition and setting.   Data Reviewed: CBC: Recent Labs  Lab 07/21/19 0951 07/22/19 0241 07/23/19 0239  WBC 16.8* 16.4* 10.8*  NEUTROABS 14.1*  --  8.5*  HGB 10.6* 10.5* 8.7*  HCT 33.0* 33.0* 26.5*  MCV 93.0 94.0 92.3  PLT 234 [id number]   Basic Metabolic Panel: Recent Labs  Lab 07/21/19 0951 07/21/19 1742 07/22/19 0241 07/23/19 0239  NA 138  --  134* 135  K 4.1  --  3.5 4.1  CL 99  --  100 107  CO2 27  --  20* 21*  GLUCOSE 101*  --  100* 138*  BUN 15  --  16 14  CREATININE 0.86  --  0.84 0.75  CALCIUM 9.1  --  8.3* 7.7*  MG  --  1.5*  --  2.0  PHOS  --  3.1  --   --    GFR: Estimated Creatinine Clearance: 66.9 mL/min (by C-G formula based on SCr of 0.75 mg/dL). Liver Function Tests: Recent Labs  Lab 07/21/19 0951 07/22/19 0241  AST 27 32   ALT 14 13  ALKPHOS 83 73  BILITOT 2.4* 1.8*  PROT 7.4 6.6  ALBUMIN 4.2 3.5   No results for input(s): LIPASE, AMYLASE in the last 168 hours. No results for input(s): AMMONIA in the last 168 hours. Coagulation Profile: Recent Labs  Lab 07/21/19 0951 07/22/19 0241 07/23/19 0239  INR 2.2* 2.2* 3.4*   Cardiac Enzymes: No results for input(s): CKTOTAL, CKMB, CKMBINDEX, TROPONINI in the last 168 hours. BNP (last 3 results) No results for input(s): PROBNP in the last 8760 hours. HbA1C: No results for input(s): HGBA1C in the last 72 hours. CBG: Recent Labs  Lab 07/21/19 1743  GLUCAP 80   Lipid Profile: No results for input(s): CHOL, HDL, LDLCALC, TRIG, CHOLHDL, LDLDIRECT in the last 72 hours. Thyroid Function Tests: Recent Labs    07/21/19 0951  TSH 0.685   Anemia Panel: No results for input(s): VITAMINB12, FOLATE, FERRITIN, TIBC, IRON, RETICCTPCT in the last 72 hours. Urine analysis:    Component Value Date/Time   COLORURINE YELLOW 07/21/2019 1230   APPEARANCEUR CLEAR 07/21/2019 1230   LABSPEC 1.017 07/21/2019 1230  PHURINE 5.0 07/21/2019 1230   GLUCOSEU NEGATIVE 07/21/2019 1230   HGBUR LARGE (A) 07/21/2019 1230   BILIRUBINUR NEGATIVE 07/21/2019 1230   KETONESUR 20 (A) 07/21/2019 1230   PROTEINUR 30 (A) 07/21/2019 1230   UROBILINOGEN 0.2 06/02/2011 0300   NITRITE NEGATIVE 07/21/2019 1230   LEUKOCYTESUR NEGATIVE 07/21/2019 1230   Sepsis Labs: @LABRCNTIP (procalcitonin:4,lacticidven:4)  ) Recent Results (from the past 240 hour(s))  Blood Culture (routine x 2)     Status: None (Preliminary result)   Collection Time: 07/21/19  9:48 AM   Specimen: BLOOD LEFT HAND  Result Value Ref Range Status   Specimen Description BLOOD LEFT HAND  Final   Special Requests   Final    BOTTLES DRAWN AEROBIC AND ANAEROBIC Blood Culture results may not be optimal due to an inadequate volume of blood received in culture bottles   Culture   Final    NO GROWTH 2 DAYS Performed at  [facility], [place] [address], [place], [place] [address]    Report Status PENDING  Incomplete  Blood Culture (routine x 2)     Status: None (Preliminary result)   Collection Time: 07/21/19  9:51 AM   Specimen: BLOOD  Result Value Ref Range Status   Specimen Description BLOOD RIGHT ANTECUBITAL  Final   Special Requests   Final    BOTTLES DRAWN AEROBIC ONLY Blood Culture results may not be optimal due to an inadequate volume of blood received in culture bottles   Culture   Final    NO GROWTH 2 DAYS Performed at [facility], [place] [address] [place], [place] [address]    Report Status PENDING  Incomplete  Respiratory Panel by RT PCR (Flu A&B, Covid) - Nasopharyngeal Swab     Status: None   Collection Time: 07/21/19 12:25 PM   Specimen: Nasopharyngeal Swab  Result Value Ref Range Status   SARS Coronavirus 2 by RT PCR NEGATIVE NEGATIVE Final    Comment: (NOTE) SARS-CoV-2 target nucleic acids are NOT DETECTED. The SARS-CoV-2 RNA is generally detectable in upper respiratoy specimens during the acute phase of infection. The lowest concentration of SARS-CoV-2 viral copies this assay can detect is 131 copies/mL. A negative result does not preclude SARS-Cov-2 infection and should not be used as the sole basis for treatment or other patient management decisions. A negative result may occur with  improper specimen collection/handling, submission of specimen other than nasopharyngeal swab, presence of viral mutation(s) within the areas targeted by this assay, and inadequate number of viral copies (<131 copies/mL). A negative result must be combined with clinical observations, patient history, and epidemiological information. The expected result is Negative. Fact Sheet for Patients:  PinkCheek.be Fact Sheet for Healthcare Providers:  GravelBags.it This test is not yet ap proved or cleared by the [place] FDA and  has  been authorized for detection and/or diagnosis of SARS-CoV-2 by FDA under an Emergency Use Authorization (EUA). This EUA will remain  in effect (meaning this test can be used) for the duration of the COVID-19 declaration under Section 564(b)(1) of the Act, 21 U.S.C. section 360bbb-3(b)(1), unless the authorization is terminated or revoked sooner.    Influenza A by PCR NEGATIVE NEGATIVE Final   Influenza B by PCR NEGATIVE NEGATIVE Final    Comment: (NOTE) The Xpert Xpress SARS-CoV-2/FLU/RSV assay is intended as an aid in  the diagnosis of influenza from Nasopharyngeal swab specimens and  should not be used as a sole basis for treatment. Nasal washings and  aspirates are unacceptable for Xpert Xpress SARS-CoV-2/FLU/RSV  testing. Fact Sheet for Patients: PinkCheek.be Fact Sheet for Healthcare Providers: GravelBags.it This test is not yet approved or cleared by the [place] FDA and  has been authorized for detection and/or diagnosis of SARS-CoV-2 by  FDA under an Emergency Use Authorization (EUA). This EUA will remain  in effect (meaning this test can be used) for the duration of the  Covid-19 declaration under Section 564(b)(1) of the Act, 21  U.S.C. section 360bbb-3(b)(1), unless the authorization is  terminated or revoked. Performed at [facility], [place] [address] [place], [place] [address]   Urine culture     Status: None   Collection Time: 07/21/19  1:13 PM   Specimen: In/Out Cath Urine  Result Value Ref Range Status   Specimen Description IN/OUT CATH URINE  Final   Special Requests NONE  Final   Culture   Final    NO GROWTH Performed at [facility], [place] [address] [place], [place] [address]    Report Status 07/22/2019 FINAL  Final  MRSA PCR Screening     Status: None   Collection Time: 07/21/19  7:53 PM   Specimen: Nasopharyngeal  Result Value Ref Range Status   MRSA by PCR NEGATIVE NEGATIVE  Final    Comment:        The GeneXpert MRSA Assay (FDA approved for NASAL specimens only), is one component of a comprehensive MRSA colonization surveillance program. It is not intended to diagnose MRSA infection nor to guide or monitor treatment for MRSA infections. Performed at [facility], [place] [address] [place], [place] [address]       Studies: No results found.  Scheduled Meds: . Chlorhexidine Gluconate Cloth  6 each Topical Daily  . ferrous sulfate  325 mg Oral Q breakfast  . folic acid  1 mg Oral Daily  . levETIRAcetam  1,500 mg Oral BID  . pantoprazole  40 mg Oral Daily  . phenytoin  200 mg Oral Daily  . predniSONE  1 mg Oral Q breakfast  . warfarin  2.5 mg Oral Once per day on Mon Fri  . warfarin  5 mg Oral Once per day on Sun Tue Wed Thu Sat  . Warfarin - Pharmacist Dosing Inpatient   Does not apply q1600    Continuous Infusions: . sodium chloride 100 mL/hr at 07/23/19 1030  . amiodarone 30 mg/hr (07/23/19 0954)  . ceFEPime (MAXIPIME) IV 2 g (07/23/19 0916)  . sodium chloride    . vancomycin 500 mg (07/23/19 1031)     LOS: 2 days     Kayleen Memos, MD [facility] Pager [phone]  If 7PM-7AM, please contact night-coverage [email or web address] Password [email or web address] 07/23/2019, 10:45 AM

## 2019-07-23 NOTE — Plan of Care (Signed)
  Problem: Health Behavior/Discharge Planning: Goal: Ability to manage health-related needs will improve Outcome: Progressing   Problem: Clinical Measurements: Goal: Diagnostic test results will improve Outcome: Progressing   Problem: Nutrition: Goal: Adequate nutrition will be maintained Outcome: Progressing   Problem: Safety: Goal: Ability to remain free from injury will improve Outcome: Progressing   Problem: Education: Goal: Knowledge of General Education information will improve Description: Including pain rating scale, medication(s)/side effects and non-pharmacologic comfort measures Outcome: Progressing   Problem: Clinical Measurements: Goal: Ability to maintain clinical measurements within normal limits will improve Outcome: Progressing Goal: Will remain free from infection Outcome: Progressing Goal: Respiratory complications will improve Outcome: Progressing Goal: Cardiovascular complication will be avoided Outcome: Progressing   Problem: Activity: Goal: Risk for activity intolerance will decrease Outcome: Progressing   Problem: Coping: Goal: Level of anxiety will decrease Outcome: Progressing   Problem: Elimination: Goal: Will not experience complications related to bowel motility Outcome: Progressing Goal: Will not experience complications related to urinary retention Outcome: Progressing   Problem: Pain Managment: Goal: General experience of comfort will improve Outcome: Progressing   Problem: Skin Integrity: Goal: Risk for impaired skin integrity will decrease Outcome: Progressing

## 2019-07-23 NOTE — Progress Notes (Signed)
Initial Nutrition Assessment  DOCUMENTATION CODES:   Severe malnutrition in context of chronic illness  INTERVENTION:   - Ensure Enlive po TID, each supplement provides 350 kcal and 20 grams of protein  - Magic cup BID with meals, each supplement provides 290 kcal and 9 grams of protein  - MVI with minerals daily  - Encourage adequate PO intake  NUTRITION DIAGNOSIS:   Severe Malnutrition related to chronic illness (dementia, stroke) as evidenced by severe fat depletion, severe muscle depletion.  GOAL:   Patient will meet greater than or equal to 90% of their needs  MONITOR:   PO intake, Supplement acceptance, Labs, Weight trends, I & O's  REASON FOR ASSESSMENT:   Malnutrition Screening Tool    ASSESSMENT:   79 year old male who presented on 5/01 with generalized weakness. PMH of HTN, seizure disorder, stroke, dementia, GERD. Pt reporting decreased appetite and 20 lb weight loss in the last 2-3 months. Admitted with severe sepsis secondary to LLL CAP.   Spoke with pt at bedside. Pt is a poor historian but able to provide some information regarding diet and weight history.  Pt states he has "no appetite" and he just does not want food. Pt reports that he did not eat anything for breakfast this morning. Pt reports that he typically eats a light breakfast and a light lunch but a regular dinner.  Pt reports his UBW as 150 lbs and states that he last weighed this 1 year ago. Reviewed weight history in chart. Current weight is elevated compared to weight 10 months ago. Unable to quantify weight loss at this time.  Pt willing to drink an oral nutrition supplement during admission. RD will order Ensure Enlive and [employer].  Meal Completion: 0-10% x 3 meals  Medications reviewed and include: ferrous sulfate, folic acid, protonix, prednisone, warfarin, IV abx, NS @ 100 ml/hr  Labs reviewed: hemoglobin 8.7  UOP: 775 ml x 24 hours  NUTRITION - FOCUSED PHYSICAL EXAM:   Most Recent Value  Orbital Region  Severe depletion  Upper Arm Region  Severe depletion  Thoracic and Lumbar Region  Severe depletion  Buccal Region  Severe depletion  Temple Region  Severe depletion  Clavicle Bone Region  Severe depletion  Clavicle and Acromion Bone Region  Severe depletion  Scapular Bone Region  Severe depletion  Dorsal Hand  Severe depletion  Patellar Region  Moderate depletion  Anterior Thigh Region  Moderate depletion  Posterior Calf Region  Moderate depletion  Edema (RD Assessment)  None  Hair  Reviewed  Eyes  Reviewed  Mouth  Reviewed  Skin  Reviewed  Nails  Reviewed       Diet Order:   Diet Order            Diet regular Room service appropriate? Yes with Assist; Fluid consistency: Thin  Diet effective now              EDUCATION NEEDS:   Not appropriate for education at this time  Skin:  Skin Assessment: Reviewed RN Assessment  Last BM:  07/23/19 medium type 6  Height:   Ht Readings from Last 1 Encounters:  07/21/19 6\' 1"  (1.854 m)    Weight:   Wt Readings from Last 1 Encounters:  07/23/19 64.2 kg    Ideal Body Weight:  83.6 kg  BMI:  Body mass index is 18.67 kg/m.  Estimated Nutritional Needs:   Kcal:  1900-2100  Protein:  90-110 grams  Fluid:  >/= 1.8 L  Gaynell Face, MS, RD, LDN Inpatient Clinical Dietitian Pager: [phone] Weekend/After Hours: [phone]

## 2019-07-23 NOTE — Progress Notes (Signed)
ANTICOAGULATION CONSULT NOTE   Pharmacy Consult for warfarin Indication: atrial fibrillation  No Known Allergies  Patient Measurements: Height: 6\' 1"  (185.4 cm) Weight: 64.2 kg (141 lb 8.6 oz) IBW/kg (Calculated) : 79.9\  Vital Signs: Temp: 98.3 F (36.8 C) (05/03 1125) Temp Source: Oral (05/03 1125) BP: 118/68 (05/03 1125) Pulse Rate: 88 (05/03 1125)  Labs: Recent Labs    07/21/19 0951 07/21/19 0951 07/21/19 1742 07/22/19 0241 07/23/19 0239  HGB 10.6*   < >  --  10.5* 8.7*  HCT 33.0*  --   --  33.0* 26.5*  PLT 234  --   --  216 203  APTT 43*  --   --  48*  --   LABPROT 23.8*  --   --  23.6* 33.2*  INR 2.2*  --   --  2.2* 3.4*  CREATININE 0.86  --   --  0.84 0.75  TROPONINIHS 18*  --  26*  --   --    < > = values in this interval not displayed.    Estimated Creatinine Clearance: 66.9 mL/min (by C-G formula based on SCr of 0.75 mg/dL).   Medical History: Past Medical History:  Diagnosis Date  . Hiatal hernia   . HTN (hypertension)    pt denies h/o HTN though it appears he was started on spironolactone during his hospitalization 3/13  . Pancreatitis   . Seizures ([place])   . Stroke ([place])   . Typical atrial flutter ([place]) 3/13    Medications:  Medications Prior to Admission  Medication Sig Dispense Refill Last Dose  . ferrous sulfate 325 (65 FE) MG tablet Take 1 tablet (325 mg total) by mouth 3 (three) times daily with meals. (Patient taking differently: Take 325 mg by mouth daily with breakfast. ) 60 tablet 0 07/20/2019 at Unknown time  . levETIRAcetam (KEPPRA) 500 MG tablet TAKE 3 TABLETS BY MOUTH TWICE A DAY (Patient taking differently: Take 1,500 mg by mouth 2 (two) times daily. ) 540 tablet 3 07/20/2019 at Unknown time  . phenytoin (DILANTIN) 200 MG ER capsule Take 1 capsule (200 mg total) by mouth at bedtime. (Patient taking differently: Take 200 mg by mouth daily. ) 30 capsule 1 07/20/2019 at Unknown time  . predniSONE (DELTASONE) 5 MG tablet Take 1 mg by mouth  daily.    07/20/2019 at Unknown time  . warfarin (COUMADIN) 2.5 MG tablet TAKE AS DIRECTED BY COUMADIN CLINIC. (Patient taking differently: Take 2.5 mg by mouth See admin instructions. TAKE AS DIRECTED BY COUMADIN CLINIC.) 180 tablet 1 07/20/2019 at Unknown time  . folic acid (FOLVITE) 1 MG tablet Take 1 tablet (1 mg total) by mouth daily. (Patient not taking: Reported on 07/21/2019) 90 tablet 1 Not Taking at Unknown time  . pantoprazole (PROTONIX) 40 MG tablet Take 1 tablet (40 mg total) by mouth daily for 30 days. 30 tablet 0   . polyethylene glycol (MIRALAX / GLYCOLAX) 17 g packet Take 17 g by mouth daily as needed for mild constipation. (Patient not taking: Reported on 07/21/2019) 14 each 0 Not Taking at Unknown time    Assessment: 9 YOM admitted with generalized weakness on warfarin at home for Afib. INR therapeutic on admission at 2.2 -INR up to 3.4 (possibly in setting of antibiotics, po intake)   Home warfarin regimen: 2.5 mg on Mon & Fri; 5 mg on all other days   Goal of Therapy:  INR 2-3 Monitor platelets by anticoagulation protocol: Yes   Plan:  -Hold  warfarin today -Monitor daily INR    Hildred Laser, PharmD Clinical Pharmacist **Pharmacist phone directory can now be found on [email or web address] (PW [email or web address]).  Listed under [place].

## 2019-07-24 ENCOUNTER — Inpatient Hospital Stay (HOSPITAL_COMMUNITY): Payer: Medicare Other

## 2019-07-24 DIAGNOSIS — I4892 Unspecified atrial flutter: Secondary | ICD-10-CM

## 2019-07-24 DIAGNOSIS — I10 Essential (primary) hypertension: Secondary | ICD-10-CM | POA: Diagnosis not present

## 2019-07-24 DIAGNOSIS — I4510 Unspecified right bundle-branch block: Secondary | ICD-10-CM | POA: Diagnosis not present

## 2019-07-24 LAB — BASIC METABOLIC PANEL
Anion gap: 5 (ref 5–15)
BUN: 9 mg/dL (ref 8–23)
CO2: 17 mmol/L — ABNORMAL LOW (ref 22–32)
Calcium: 7.4 mg/dL — ABNORMAL LOW (ref 8.9–10.3)
Chloride: 114 mmol/L — ABNORMAL HIGH (ref 98–111)
Creatinine, Ser: 0.7 mg/dL (ref 0.61–1.24)
GFR calc Af Amer: >60 mL/min (ref >60)
GFR calc non Af Amer: >60 mL/min (ref >60)
Glucose, Bld: 112 mg/dL — ABNORMAL HIGH (ref 70–99)
Potassium: 3.5 mmol/L (ref 3.5–5.1)
Sodium: 136 mmol/L (ref 135–145)

## 2019-07-24 LAB — CBC WITH DIFFERENTIAL/PLATELET
Abs Immature Granulocytes: 0 10*3/uL (ref 0.00–0.07)
Basophils Absolute: 0.1 10*3/uL (ref 0.0–0.1)
Basophils Relative: 2 %
Eosinophils Absolute: 0.2 10*3/uL (ref 0.0–0.5)
Eosinophils Relative: 3 %
HCT: 25.1 % — ABNORMAL LOW (ref 39.0–52.0)
Hemoglobin: 8.4 g/dL — ABNORMAL LOW (ref 13.0–17.0)
Lymphocytes Relative: 4 %
Lymphs Abs: 0.3 10*3/uL — ABNORMAL LOW (ref 0.7–4.0)
MCH: 30.5 pg (ref 26.0–34.0)
MCHC: 33.5 g/dL (ref 30.0–36.0)
MCV: 91.3 fL (ref 80.0–100.0)
Monocytes Absolute: 0.3 10*3/uL (ref 0.1–1.0)
Monocytes Relative: 4 %
Neutro Abs: 6 10*3/uL (ref 1.7–7.7)
Neutrophils Relative %: 86 %
Platelets: 205 10*3/uL (ref 150–400)
RBC: 2.75 MIL/uL — ABNORMAL LOW (ref 4.22–5.81)
RDW: 25.3 % — ABNORMAL HIGH (ref 11.5–15.5)
WBC: 7 10*3/uL (ref 4.0–10.5)
nRBC: 0 % (ref 0.0–0.2)
nRBC: 2 /100 WBC — ABNORMAL HIGH

## 2019-07-24 LAB — ECHOCARDIOGRAM COMPLETE
Height: 73 in
Weight: 2384.5 oz

## 2019-07-24 LAB — PROTIME-INR
INR: 4.4 (ref 0.8–1.2)
Prothrombin Time: 41 seconds — ABNORMAL HIGH (ref 11.4–15.2)

## 2019-07-24 LAB — PROCALCITONIN: Procalcitonin: 7.79 ng/mL

## 2019-07-24 MED ORDER — AMIODARONE HCL 200 MG PO TABS
200.0000 mg | ORAL_TABLET | Freq: Two times a day (BID) | ORAL | Status: DC
  Administered 2019-07-24 – 2019-07-27 (×7): 200 mg via ORAL
  Filled 2019-07-24 (×7): qty 1

## 2019-07-24 MED ORDER — SODIUM BICARBONATE 8.4 % IV SOLN
50.0000 meq | Freq: Once | INTRAVENOUS | Status: AC
  Administered 2019-07-24: 12:00:00 50 meq via INTRAVENOUS
  Filled 2019-07-24: qty 50

## 2019-07-24 MED ORDER — POTASSIUM CHLORIDE CRYS ER 20 MEQ PO TBCR
40.0000 meq | EXTENDED_RELEASE_TABLET | Freq: Once | ORAL | Status: AC
  Administered 2019-07-24: 12:00:00 40 meq via ORAL
  Filled 2019-07-24: qty 2

## 2019-07-24 MED ORDER — AMIODARONE HCL 200 MG PO TABS: 400.0000 mg | ORAL_TABLET | Freq: Two times a day (BID) | ORAL | Status: DC

## 2019-07-24 NOTE — Evaluation (Addendum)
Occupational Therapy Evaluation Patient Details Name: [name] MRN: [id number] DOB: 15-Apr-1940 Today's Date: 07/24/2019    History of Present Illness 79 yo admittted with weakness with sepsis due to PNA with metabolic encephalopathy. PMHx: HTN, Sz, CVA, Aflutter, dementia   Clinical Impression   Pt PTA: Pt was living alone prior and mostly independent. Pt currently limited by decreased strength, decreased ability to care for self and decreased cognitive deficits in memory, problem solving and safety awareness. Pt maxA overall for ADL tasks. Pt modA for stand pivot transfer to [facility]. Pt requiring simple commands to complete tasks. Pt standing at sink for light grooming ~5 mins before requiring seated rest break. Pt VSS.  Pt would benefit from continued OT skilled services for ADL, mobility and safety in SNF setting. OT following acutely.     Follow Up Recommendations  SNF;Supervision/Assistance - 24 hour    Equipment Recommendations  3 in 1 bedside commode;Other (comment)(defer to next facility)    Recommendations for Other Services       Precautions / Restrictions Precautions Precautions: Fall Restrictions Weight Bearing Restrictions: No      Mobility Bed Mobility Overal bed mobility: Needs Assistance Bed Mobility: Supine to Sit     Supine to sit: Min assist     General bed mobility comments: Pt minguardA to faciliate BLE movement and trunk elevation.  Transfers Overall transfer level: Needs assistance Equipment used: Rolling walker (2 wheeled) Transfers: Sit to/from Stand Sit to Stand: Min assist         General transfer comment: pt requiring multimodal cues to push from bed instead of RW.    Balance Overall balance assessment: Needs assistance Sitting-balance support: Bilateral upper extremity supported Sitting balance-Leahy Scale: Fair     Standing balance support: Bilateral upper extremity supported Standing balance-Leahy Scale: Poor                              ADL either performed or assessed with clinical judgement   ADL Overall ADL's : Needs assistance/impaired Eating/Feeding: Set up;Sitting   Grooming: Min guard;Standing Grooming Details (indicate cue type and reason): x5 mins at sink Upper Body Bathing: Minimal assistance;Standing   Lower Body Bathing: Moderate assistance;Cueing for safety;Sitting/lateral leans;Sit to/from stand Lower Body Bathing Details (indicate cue type and reason): Pt unsteady in standing Upper Body Dressing : Minimal assistance;Sitting;Standing;Cueing for safety   Lower Body Dressing: Maximal assistance;Cueing for safety;Sitting/lateral leans;Sit to/from stand   Toilet Transfer: Moderate assistance;Stand-pivot;RW;BSC Toilet Transfer Details (indicate cue type and reason): Pt much safer using RW for stability and use of RW. Pt unaware of where to put BUEs when pt without RW Toileting- Clothing Manipulation and Hygiene: Maximal assistance;Cueing for safety;Sitting/lateral lean;Sit to/from stand Toileting - Clothing Manipulation Details (indicate cue type and reason): Pt standing for ADL tasks, but unable to participate with BUEs due to pt's unsteadiness in standing.      Functional mobility during ADLs: Moderate assistance;Rolling walker(+2 for progressive mobility (more than a transfer_) General ADL Comments: Pt requiring simple commands to complete tasks. Pt standing at sink for light grooming ~ 5 mins before requiring seated rest break     Vision Baseline Vision/History: No visual deficits Patient Visual Report: No change from baseline Vision Assessment?: No apparent visual deficits Additional Comments: Pt stating the time and "call, don't fall" when OTR pointed to sign     Perception     Praxis      Pertinent Vitals/Pain Pain  Assessment: No/denies pain     Hand Dominance Right   Extremity/Trunk Assessment Upper Extremity Assessment Upper Extremity Assessment: Generalized  weakness   Lower Extremity Assessment Lower Extremity Assessment: Generalized weakness   Cervical / Trunk Assessment Cervical / Trunk Assessment: Kyphotic   Communication Communication Communication: HOH   Cognition Arousal/Alertness: Awake/alert Behavior During Therapy: WFL for tasks assessed/performed Overall Cognitive Status: Impaired/Different from baseline Area of Impairment: Orientation;Memory;Following commands;Safety/judgement;Problem solving                 Orientation Level: Disoriented to;Time;Situation   Memory: Decreased short-term memory Following Commands: Follows one step commands with increased time Safety/Judgement: Decreased awareness of safety;Decreased awareness of deficits   Problem Solving: Difficulty sequencing;Requires verbal cues;Requires tactile cues General Comments: Pt performing tasks with simple commands and repeated instructions due to [facility] behavior.   General Comments  Pt following commands. VSS on RA.     Exercises     Shoulder Instructions      Home Living Family/patient expects to be discharged to:: Private residence Living Arrangements: Alone Available Help at Discharge: Family;Available PRN/intermittently Type of Home: House Home Access: Stairs to enter [employer] of Steps: 1 Entrance Stairs-Rails: None Home Layout: Able to live on main level with bedroom/bathroom     Bathroom Shower/Tub: Occupational psychologist: Standard     Home Equipment: None          Prior Functioning/Environment Level of Independence: Needs assistance  Gait / Transfers Assistance Needed: walks with a cane, but "tries not to if I can avoid it" reports h/o falls ADL's / Homemaking Assistance Needed: pt reports he performs ADLs unassisted. He requests meal assistance Communication / Swallowing Assistance Needed: HOH Comments: pt reports he was taking care of himself, per prior admission pt does not drive; family assists for  groceries        OT Problem List: Decreased strength;Decreased activity tolerance;Impaired balance (sitting and/or standing);Decreased safety awareness;Pain;Decreased knowledge of use of DME or AE;Decreased cognition      OT Treatment/Interventions: Self-care/ADL training;Therapeutic exercise;Energy conservation;DME and/or AE instruction;Therapeutic activities;Cognitive remediation/compensation;Visual/perceptual remediation/compensation;Patient/family education;Balance training    OT Goals(Current goals can be found in the care plan section) Acute Rehab OT Goals Patient Stated Goal: be able to walk and get better OT Goal Formulation: With patient Time For Goal Achievement: 08/07/19 Potential to Achieve Goals: Good ADL Goals Pt Will Perform Grooming: with min guard assist;standing Pt Will Perform Lower Body Dressing: with min assist;sitting/lateral leans;sit to/from stand Pt Will Transfer to Toilet: with min guard assist;ambulating;bedside commode Pt/caregiver will Perform Home Exercise Program: Increased strength;Both right and left upper extremity;With Supervision Additional ADL Goal #1: Pt will consistently follow 2 step commands with minimal cues for sequencing in a non distracting environment. Additional ADL Goal #2: Pt will tolerate x8 mins for OOB ADL and 1 seated rest break throughout in order to increase activity tolerance.  OT Frequency: Min 2X/week   Barriers to D/C:            Co-evaluation              AM-PAC OT "6 Clicks" Daily Activity     Outcome Measure Help from another person eating meals?: A Little Help from another person taking care of personal grooming?: A Little Help from another person toileting, which includes using toliet, bedpan, or urinal?: A Lot Help from another person bathing (including washing, rinsing, drying)?: A Lot Help from another person to put on and taking off regular upper body  clothing?: A Little Help from another person to put on  and taking off regular lower body clothing?: A Lot 6 Click Score: 15   End of Session Equipment Utilized During Treatment: Gait belt;Rolling walker Nurse Communication: Mobility status  Activity Tolerance: Patient tolerated treatment well Patient left: in chair;with call bell/phone within reach;with chair alarm set  OT Visit Diagnosis: Unsteadiness on feet (R26.81);Muscle weakness (generalized) (M62.81);Pain Pain - part of body: (back)                Time: 0819-0900 OT Time Calculation (min): 41 min Charges:  OT General Charges $OT Visit: 1 Visit OT Evaluation $OT Eval Moderate Complexity: 1 Mod OT Treatments $Self Care/Home Management : 23-37 mins  Jefferey Pica, OTR/L Acute Rehabilitation Services Pager: [phone] Office: [phone]   Deondrea Markos C 07/24/2019, 3:55 PM

## 2019-07-24 NOTE — Progress Notes (Addendum)
Progress Note  Patient Name: [name] Date of Encounter: 07/24/2019  Primary Cardiologist: No primary care provider on file.   Subjective   Denies any chest pain or SOB.  HR in the 70's in atrial fibrillation. Feels much better today  Inpatient Medications    Scheduled Meds: . Chlorhexidine Gluconate Cloth  6 each Topical Daily  . feeding supplement (ENSURE ENLIVE)  237 mL Oral TID BM  . ferrous sulfate  325 mg Oral Q breakfast  . folic acid  1 mg Oral Daily  . levETIRAcetam  1,500 mg Oral BID  . multivitamin with minerals  1 tablet Oral Daily  . pantoprazole  40 mg Oral Daily  . phenytoin  200 mg Oral Daily  . predniSONE  1 mg Oral Q breakfast  . Warfarin - Pharmacist Dosing Inpatient   Does not apply q1600   Continuous Infusions: . sodium chloride 100 mL/hr at 07/23/19 2320  . amiodarone 30 mg/hr (07/23/19 2115)  . ceFEPime (MAXIPIME) IV Stopped (07/24/19 0157)  . sodium chloride     PRN Meds: acetaminophen **OR** acetaminophen, ondansetron **OR** ondansetron (ZOFRAN) IV   Vital Signs    Vitals:   07/23/19 2300 07/24/19 0446 07/24/19 0634 07/24/19 0701  BP: 102/61 (!) 104/53  109/63  Pulse: (!) 114 (!) 107  100  Resp: 17 14  19   Temp: 98.4 F (36.9 C) 98.4 F (36.9 C)  98.5 F (36.9 C)  TempSrc: Oral Oral  Oral  SpO2: 95% 95%  100%  Weight:   67.6 kg   Height:        Intake/Output Summary (Last 24 hours) at 07/24/2019 0811 Last data filed at 07/24/2019 0701 Gross per 24 hour  Intake 3601.39 ml  Output 150 ml  Net 3451.39 ml   Filed Weights   07/22/19 0500 07/23/19 0530 07/24/19 0634  Weight: 60.8 kg 64.2 kg 67.6 kg    Telemetry    Atrial fibrillation/flutter - Personally Reviewed  ECG    No new EKG to review - Personally Reviewed  Physical Exam   GEN: No acute distress.   Neck: No JVD Cardiac: irregularly irregular, no murmurs, rubs, or gallops.  Respiratory: Clear to auscultation bilaterally. GI: Soft, nontender, non-distended    MS: No edema; No deformity. Neuro:  Nonfocal  Psych: Normal affect   Labs    Chemistry Recent Labs  Lab 07/21/19 0951 07/21/19 0951 07/22/19 0241 07/23/19 0239 07/24/19 0253  NA 138   < > 134* 135 136  K 4.1   < > 3.5 4.1 3.5  CL 99   < > 100 107 114*  CO2 27   < > 20* 21* 17*  GLUCOSE 101*   < > 100* 138* 112*  BUN 15   < > 16 14 9   CREATININE 0.86   < > 0.84 0.75 0.70  CALCIUM 9.1   < > 8.3* 7.7* 7.4*  PROT 7.4  --  6.6  --   --   ALBUMIN 4.2  --  3.5  --   --   AST 27  --  32  --   --   ALT 14  --  13  --   --   ALKPHOS 83  --  73  --   --   BILITOT 2.4*  --  1.8*  --   --   GFRNONAA >60   < > >60 >60 >60  GFRAA >60   < > >60 >60 >60  ANIONGAP 12   < >  14 7 5    < > = values in this interval not displayed.     Hematology Recent Labs  Lab 07/22/19 0241 07/23/19 0239 07/24/19 0253  WBC 16.4* 10.8* 7.0  RBC 3.51* 2.87* 2.75*  HGB 10.5* 8.7* 8.4*  HCT 33.0* 26.5* 25.1*  MCV 94.0 92.3 91.3  MCH 29.9 30.3 30.5  MCHC 31.8 32.8 33.5  RDW 25.1* 24.7* 25.3*  PLT 216 203 205    Cardiac EnzymesNo results for input(s): TROPONINI in the last 168 hours. No results for input(s): TROPIPOC in the last 168 hours.   BNPNo results for input(s): BNP, PROBNP in the last 168 hours.   DDimer No results for input(s): DDIMER in the last 168 hours.   Radiology    No results found.  Cardiac Studies   none  Patient Profile     79 y.o. male who is being seen  for the evaluation of atrial fibrillation with RVR at the request of Francia Greaves, DO.    Assessment & Plan    1.  Persistent atrial fibrillation/flutter with RVR -HR improved into the 70-90's -he was last in NSR in July 2020. -completely asymptomatic at this time -TSH 1.33 -HR should continue to improve with treatment of underlying sepsis -BP too soft for addition of BB or CCB -INRs have been therapeutic since 02/2019 -INR elevated today, may be due to Amio - coumadin on hold for now -K+ 3.5 today -echo  pending -will change Amio IV to 400mg  BID PO -consider DCCV but would not do this until we know he does not need any procedures (abnormal abd CT and ongoing abdominal pain and firm abdomen).    2.  Sepsis -secondary to PNA, UTI and possible enteritis -now on IV antibx per TRH -abdominal CT showed multifocal diverticulosis without diverticulitis, colonic wall thickening without inflammation and diffuse bladder wall thickening seen with UTIs.  ? Underlying neoplasm in splenic flexure -abdomen is firm and diffusely tender -consider GI consult  3. Elevated trop -minimally elevated with flat trend (18>26) -2D echo pending -suspect this is demand ischemia in the setting of sepsis and tachycardia  4.  HTN -BP stable at 109/49mmHg -he has not required any antihypertensive therapy recently       For questions or updates, please contact [place] Please consult [email or web address] for contact info under Cardiology/STEMI.      Signed, Fransico Him, MD  07/24/2019, 8:11 AM

## 2019-07-24 NOTE — Progress Notes (Signed)
  Speech Language Pathology Treatment: Dysphagia  Patient Details Name: [name] MRN: [id number] DOB: February 11, 1941 Today's Date: 07/24/2019 Time: [id number] SLP Time Calculation (min) (ACUTE ONLY): 25 min  Assessment / Plan / Recommendation Clinical Impression  Pt seen at lunch for assessment of diet tolerance and continued education. Pt noted to clear throat intermittently throughout this session, even when not eating drinking. This is apparently a baseline behavior. Pt was observed eating chicken, green beans, and rice, and drinking tea. No overt s/s aspiration observed, however pt reports globus sensation, especially after taking 2 large pills. Presentation and pt report raise concern for primary esophageal dysphagia. Recommend consideration of a regular barium swallow to evaluate esophageal motility. RN and MD informed of this recommendation, and order was received. SLP will follow up after results available and proceed accordingly.   HPI HPI: Pt is an 79 y.o. male with medical history significant of hypertension, seizure disorder, stroke, atrial flutter-on Coumadin, dementia, GERD who presented to the ED due to generalized weakness. CXR 5/1: Mild consolidative opacities at the left lung base may represent atelectasis, aspiration, or pneumonia.      SLP Plan  Continue with current plan of care       Recommendations  Diet recommendations: Regular;Dysphagia 3 (mechanical soft);Thin liquid Liquids provided via: Cup;Straw Medication Administration: Crushed with puree Supervision: Patient able to self feed Compensations: Minimize environmental distractions;Slow rate Postural Changes and/or Swallow Maneuvers: Seated upright 90 degrees;Upright 30-60 min after meal                Oral Care Recommendations: Oral care BID Follow up Recommendations: 24 hour supervision/assistance SLP Visit Diagnosis: Dysphagia, unspecified (R13.10) Plan: Continue with current plan of care       [place]. Quentin Ore, [facility], [place] Speech Language Pathologist Office: [phone] Pager: [phone]  Shonna Chock 07/24/2019, 1:33 PM

## 2019-07-24 NOTE — Progress Notes (Signed)
Rehab Admissions Coordinator Note:  Per family request, this patient was screened by Raechel Ache for appropriateness for an Inpatient Acute Rehab Consult.  According to LCSW, the family is able to provide 24/7 care at DC. At this time, we are recommending Inpatient Rehab consult. AC will place consult order in the chart per our protocol.    Raechel Ache 07/24/2019, 3:24 PM  I can be reached at [phone].

## 2019-07-24 NOTE — Progress Notes (Addendum)
PROGRESS NOTE  [name] [id number] DOB: 29-Oct-1940 DOA: 07/21/2019 PCP: [name], MD  HPI/Recap of past 24 hours:  [name] is a 79 y.o. male with medical history significant of hypertension, seizure disorder, stroke, atrial flutter-on Coumadin, dementia, GERD presents to emergency department due to generalized weakness.  Too weak to walk/perform daily life activities, he lives alone at home.  Has decreased appetite and has lost 20 pounds of weight in last 2 to 3 months.  Reports abdominal pain, nonbloody diarrhea since 2 to 3 weeks.   ED Course: Upon arrival to ED: Patient had fever of 102.3, tachycardic, tachypneic, CBC shows leukocytosis of 16,000, lactic acid: WNL, UA positive for rare bacteria, chest x-ray shows left lung opacity.  Urine culture, blood culture, COVID-19: Negative.  Foley was placed due to abdominal distention and urinary retention.  Patient started on IV Vanco and cefepime for the concern of sepsis.  TRH asked to admit for severe sepsis secondary to community-acquired pneumonia.  07/24/19: Seen and examined at his bedside.  States he feels better this morning.  He denies chest pain, palpitations, dyspnea, nausea or abdominal pain.  His heart rate is improved this morning, in A. fib.  Followed by cardiology.   Assessment/Plan: Principal Problem:   Sepsis ([place]) Active Problems:   Essential hypertension   Stroke (HCC)   Seizure disorder (HCC)   Paroxysmal atrial flutter (HCC)   Dementia (AD)   CAP (community acquired pneumonia)   Total bilirubin, elevated  Severe sepsis, resolving, 2/2 to left lower lobe community-acquired pneumonia Presented with fever T-max 101.1, leukocytosis with WBC 16,000, procalcitonin greater than 14, tachycardia, tachypnea with chest x-ray suggestive of left lower lobe pneumonia. Received 2 days of IV vancomycin, MRSA screen negative, discontinued vancomycin on 5/3. Continue cefepime day #3 Procalcitonin is  trending down 14>> 7 Blood cultures are negative to date. Continue to follow cultures Urine culture was negative.  Permanent A. fib/A-flutter with RVR, rapid ventricular response likely caused by his underlying lung physiology, pneumonia. TSH is normal 2D echo has been completed and results are pending Received IV amiodarone and 1 dose of digoxin Followed by cardiology IV amiodarone was changed to oral 200 mg twice daily Currently his rate is controlled in A. Fib Blood pressure is soft not allowing for beta-blocker or calcium channel blocker  Hypokalemia Potassium 3.5, goal 4.0 Received KCl oral 40 mEq x 1 dose Magnesium 2.0 Repeat BMP and magnesium level in the morning  Bite cells/Burr cells G6PD? Splenic granuloma? Hg 8.4  From 8.7, dilutional on IV fluid? Baseline Hg 10 Continue to monitor  Mild wall thickening at the splenic flexure of the colon without intestinal inflammation, nonspecific however underlying neoplasm is not excluded Follow-up colonoscopy recommended  Small bilateral pleural effusions Suspect iatrogenic in the setting of IV fluid hydration Encourage incentive spirometer and flutter valve  Pericardial effusion 2D echo completed, results are pending Defer to cardiology  Soft blood pressures Maintain MAP greater than 65 Not on antihypertensives prior to admission Currently on normal saline at 100 cc/h, reduce rate to 75 cc/h and closely monitor vital signs  Acute metabolic encephalopathy, improving, likely multifactorial in the setting of acute illness, sepsis, hospital-acquired delirium Continue to treat underlying conditions Continue to reorient as needed  Physical debility PT OT recommended 24-hour supervision/assistance Continue fall precautions TOC consulted and assisting with SNF placement  Non anion gap metabolic acidosis Serum bicarb level 17 from 21 Anion gap 5 Given 1 dose of IV na+bicarb 50  meq once Continue to hydrate Repeat BMP in  the morning  Resolved post repletion: Hypomagnesemia Magnesium 2.0 Repeat in the morning  Elevated troponin likely demand ischemia in the setting of A. fib/A-flutter RVR Troponin S peaked at 26 Denies any anginal symptoms No evidence of acute ischemia on twelve-lead EKG done on admission  Resolved post IV fluid: Hypovolemic hyponatremia  serum sodium 136  Isolated hyper bilirubinemia Presented with total bilirubin over 2 Bilirubin is trending down Possible Gilbert syndrome  Chronic normocytic anemia/iron deficiency anemia Hemoglobin stable No overt bleeding Continue ferrous sulfate Continue to monitor H&H  History of CVA  Supratherapeutic INR INR 3.4> 4.4 Hold Coumadin and repeat INR in the morning Managed by pharmacy Appreciate pharmacy's assistance  History of seizures No seizures reported since this admission Continue AEDs home medication  GERD Stable continue home medications  Chronic prednisone use Resume  DVT prophylaxis:Coumadin/TED/SCD  Code Status: Full code  Family Communication: None present at bedside.   Disposition Plan:  Patient is from home.  Anticipate discharge in the next 48-72 hours pending clinical improvement and PT OT evaluation..  Barriers to discharge: Ongoing treatment for pneumonia and Afib RvR.   Consults called: Cardiology     Objective: Vitals:   07/24/19 0634 07/24/19 0701 07/24/19 1055 07/24/19 1057  BP:  109/63 (!) 118/59 105/71  Pulse:  100  92  Resp:  19  19  Temp:  98.5 F (36.9 C)  98.3 F (36.8 C)  TempSrc:  Oral  Oral  SpO2:  100%  96%  Weight: 67.6 kg     Height:        Intake/Output Summary (Last 24 hours) at 07/24/2019 1113 Last data filed at 07/24/2019 0701 Gross per 24 hour  Intake 3273.32 ml  Output --  Net 3273.32 ml   Filed Weights   07/22/19 0500 07/23/19 0530 07/24/19 0634  Weight: 60.8 kg 64.2 kg 67.6 kg    Exam:  . General: 79 y.o. year-old male well-developed well-nourished no acute  distress.  In no acute distress.  Very hard of hearing.   . Cardiovascular: Irregular rate and rhythm no rubs or gallops.   Marland Kitchen Respiratory: Mild rales At Bases No Wheezing Noted.   . Abdomen: Nontender on palpation bowel sounds present.   . Musculoskeletal: No lower extremity edema bilaterally.   Marland Kitchen Psychiatry: Mood is appropriate for condition and setting.   Data Reviewed: CBC: Recent Labs  Lab 07/21/19 0951 07/22/19 0241 07/23/19 0239 07/24/19 0253  WBC 16.8* 16.4* 10.8* 7.0  NEUTROABS 14.1*  --  8.5* 6.0  HGB 10.6* 10.5* 8.7* 8.4*  HCT 33.0* 33.0* 26.5* 25.1*  MCV 93.0 94.0 92.3 91.3  PLT 234 216 203 [id number]   Basic Metabolic Panel: Recent Labs  Lab 07/21/19 0951 07/21/19 1742 07/22/19 0241 07/23/19 0239 07/24/19 0253  NA 138  --  134* 135 136  K 4.1  --  3.5 4.1 3.5  CL 99  --  100 107 114*  CO2 27  --  20* 21* 17*  GLUCOSE 101*  --  100* 138* 112*  BUN 15  --  16 14 9   CREATININE 0.86  --  0.84 0.75 0.70  CALCIUM 9.1  --  8.3* 7.7* 7.4*  MG  --  1.5*  --  2.0  --   PHOS  --  3.1  --   --   --    GFR: Estimated Creatinine Clearance: 70.4 mL/min (by C-G formula based on SCr of 0.7 mg/dL).  Liver Function Tests: Recent Labs  Lab 07/21/19 0951 07/22/19 0241  AST 27 32  ALT 14 13  ALKPHOS 83 73  BILITOT 2.4* 1.8*  PROT 7.4 6.6  ALBUMIN 4.2 3.5   No results for input(s): LIPASE, AMYLASE in the last 168 hours. No results for input(s): AMMONIA in the last 168 hours. Coagulation Profile: Recent Labs  Lab 07/21/19 0951 07/22/19 0241 07/23/19 0239 07/24/19 0253  INR 2.2* 2.2* 3.4* 4.4*   Cardiac Enzymes: No results for input(s): CKTOTAL, CKMB, CKMBINDEX, TROPONINI in the last 168 hours. BNP (last 3 results) No results for input(s): PROBNP in the last 8760 hours. HbA1C: No results for input(s): HGBA1C in the last 72 hours. CBG: Recent Labs  Lab 07/21/19 1743  GLUCAP 80   Lipid Profile: No results for input(s): CHOL, HDL, LDLCALC, TRIG, CHOLHDL,  LDLDIRECT in the last 72 hours. Thyroid Function Tests: Recent Labs    07/23/19 2102  TSH 1.333   Anemia Panel: No results for input(s): VITAMINB12, FOLATE, FERRITIN, TIBC, IRON, RETICCTPCT in the last 72 hours. Urine analysis:    Component Value Date/Time   COLORURINE YELLOW 07/21/2019 1230   APPEARANCEUR CLEAR 07/21/2019 1230   LABSPEC 1.017 07/21/2019 1230   PHURINE 5.0 07/21/2019 1230   GLUCOSEU NEGATIVE 07/21/2019 1230   HGBUR LARGE (A) 07/21/2019 1230   BILIRUBINUR NEGATIVE 07/21/2019 1230   KETONESUR 20 (A) 07/21/2019 1230   PROTEINUR 30 (A) 07/21/2019 1230   UROBILINOGEN 0.2 06/02/2011 0300   NITRITE NEGATIVE 07/21/2019 1230   LEUKOCYTESUR NEGATIVE 07/21/2019 1230   Sepsis Labs: @LABRCNTIP (procalcitonin:4,lacticidven:4)  ) Recent Results (from the past 240 hour(s))  Blood Culture (routine x 2)     Status: None (Preliminary result)   Collection Time: 07/21/19  9:48 AM   Specimen: BLOOD LEFT HAND  Result Value Ref Range Status   Specimen Description BLOOD LEFT HAND  Final   Special Requests   Final    BOTTLES DRAWN AEROBIC AND ANAEROBIC Blood Culture results may not be optimal due to an inadequate volume of blood received in culture bottles   Culture   Final    NO GROWTH 3 DAYS Performed at [facility], [place] [address], [place], [place] [address]    Report Status PENDING  Incomplete  Blood Culture (routine x 2)     Status: None (Preliminary result)   Collection Time: 07/21/19  9:51 AM   Specimen: BLOOD  Result Value Ref Range Status   Specimen Description BLOOD RIGHT ANTECUBITAL  Final   Special Requests   Final    BOTTLES DRAWN AEROBIC ONLY Blood Culture results may not be optimal due to an inadequate volume of blood received in culture bottles   Culture   Final    NO GROWTH 3 DAYS Performed at [facility], [place] [address] [place], [place] [address]    Report Status PENDING  Incomplete  Respiratory Panel by RT PCR (Flu A&B, Covid) -  Nasopharyngeal Swab     Status: None   Collection Time: 07/21/19 12:25 PM   Specimen: Nasopharyngeal Swab  Result Value Ref Range Status   SARS Coronavirus 2 by RT PCR NEGATIVE NEGATIVE Final    Comment: (NOTE) SARS-CoV-2 target nucleic acids are NOT DETECTED. The SARS-CoV-2 RNA is generally detectable in upper respiratoy specimens during the acute phase of infection. The lowest concentration of SARS-CoV-2 viral copies this assay can detect is 131 copies/mL. A negative result does not preclude SARS-Cov-2 infection and should not be used as  the sole basis for treatment or other patient management decisions. A negative result may occur with  improper specimen collection/handling, submission of specimen other than nasopharyngeal swab, presence of viral mutation(s) within the areas targeted by this assay, and inadequate number of viral copies (<131 copies/mL). A negative result must be combined with clinical observations, patient history, and epidemiological information. The expected result is Negative. Fact Sheet for Patients:  PinkCheek.be Fact Sheet for Healthcare Providers:  GravelBags.it This test is not yet ap proved or cleared by the [place] FDA and  has been authorized for detection and/or diagnosis of SARS-CoV-2 by FDA under an Emergency Use Authorization (EUA). This EUA will remain  in effect (meaning this test can be used) for the duration of the COVID-19 declaration under Section 564(b)(1) of the Act, 21 U.S.C. section 360bbb-3(b)(1), unless the authorization is terminated or revoked sooner.    Influenza A by PCR NEGATIVE NEGATIVE Final   Influenza B by PCR NEGATIVE NEGATIVE Final    Comment: (NOTE) The Xpert Xpress SARS-CoV-2/FLU/RSV assay is intended as an aid in  the diagnosis of influenza from Nasopharyngeal swab specimens and  should not be used as a sole basis for treatment. Nasal washings and  aspirates  are unacceptable for Xpert Xpress SARS-CoV-2/FLU/RSV  testing. Fact Sheet for Patients: PinkCheek.be Fact Sheet for Healthcare Providers: GravelBags.it This test is not yet approved or cleared by the [place] FDA and  has been authorized for detection and/or diagnosis of SARS-CoV-2 by  FDA under an Emergency Use Authorization (EUA). This EUA will remain  in effect (meaning this test can be used) for the duration of the  Covid-19 declaration under Section 564(b)(1) of the Act, 21  U.S.C. section 360bbb-3(b)(1), unless the authorization is  terminated or revoked. Performed at [facility], [place] [address], [place], [place] [address]   Urine culture     Status: None   Collection Time: 07/21/19  1:13 PM   Specimen: In/Out Cath Urine  Result Value Ref Range Status   Specimen Description IN/OUT CATH URINE  Final   Special Requests NONE  Final   Culture   Final    NO GROWTH Performed at [facility], [place] [address], [place], [place] [address]    Report Status 07/22/2019 FINAL  Final  MRSA PCR Screening     Status: None   Collection Time: 07/21/19  7:53 PM   Specimen: Nasopharyngeal  Result Value Ref Range Status   MRSA by PCR NEGATIVE NEGATIVE Final    Comment:        The GeneXpert MRSA Assay (FDA approved for NASAL specimens only), is one component of a comprehensive MRSA colonization surveillance program. It is not intended to diagnose MRSA infection nor to guide or monitor treatment for MRSA infections. Performed at [facility], [place] [address], [place], [place] [address]       Studies: No results found.  Scheduled Meds: . amiodarone  200 mg Oral BID  . feeding supplement (ENSURE ENLIVE)  237 mL Oral TID BM  . ferrous sulfate  325 mg Oral Q breakfast  . folic acid  1 mg Oral Daily  . levETIRAcetam  1,500 mg Oral BID  . multivitamin with minerals  1 tablet Oral Daily  . pantoprazole  40  mg Oral Daily  . phenytoin  200 mg Oral Daily  . predniSONE  1 mg Oral Q breakfast  . Warfarin - Pharmacist Dosing Inpatient   Does not apply [id number]  Continuous Infusions: . sodium chloride 100 mL/hr at 07/24/19 0946  . ceFEPime (MAXIPIME) IV 2 g (07/24/19 0910)  . sodium chloride       LOS: 3 days     Kayleen Memos, MD [facility] Pager [phone]  If 7PM-7AM, please contact night-coverage [email or web address] Password [email or web address] 07/24/2019, 11:13 AM

## 2019-07-24 NOTE — NC FL2 (Signed)
[place] LEVEL OF CARE SCREENING TOOL     IDENTIFICATION  Patient Name: [name] Birthdate: Jan 30, 1941 Sex: male Admission Date (Current Location): 07/21/2019  [facility] and [place] Number:  Herbalist and Address:  [facility] [place], [place] [address], [place],  [address]      Provider Number: [id number]  Attending Physician Name and Address:  Kayleen Memos, DO  Relative Name and Phone Number:  [name] (daughter) [phone]    Current Level of Care: Hospital Recommended Level of Care: [place] Prior Approval Number:    Date Approved/Denied:   PASRR Number: [id number]  Discharge Plan: SNF    Current Diagnoses: Patient Active Problem List   Diagnosis Date Noted  . Sepsis ([place]) 07/21/2019  . Total bilirubin, elevated   . Dilantin toxicity, accidental or unintentional, initial encounter 10/20/2018  . Dilantin overdose, accidental or unintentional, initial encounter 10/20/2018  . GIB (gastrointestinal bleeding) 07/22/2018  . Current chronic use of systemic steroids 01/11/2017  . RBBB 01/11/2017  . Polymyalgia ([place]) 01/11/2017  . Fever 01/07/2017  . GI bleeding 01/06/2017  . History of CVA in adulthood 01/06/2017  . Retroperitoneal hematoma 01/06/2017  . Pressure injury of skin 12/13/2016  . Fall   . Closed fracture of femur, intertrochanteric, left, initial encounter ([place]) 12/06/2016  . Severe protein-calorie malnutrition ([place]) 04/03/2016  . Hypotension 04/01/2016  . Leukopenia 03/31/2016  . Elevated INR 03/31/2016  . Hypokalemia 03/31/2016  . Acute pain of right knee 03/01/2016  . CAP (community acquired pneumonia) 12/01/2015  . Aspiration pneumonia ([place]) 12/01/2015  . Erosive gastropathy: Per EGD 11/30/2015 11/30/2015  . Normochromic anemia 11/28/2015  . Localization-related symptomatic epilepsy and epileptic syndromes with complex partial seizures, not intractable, without status  epilepticus ([place]) 07/07/2015  . Closed Colles' fracture of right radius with routine healing 05/02/2015  . Pain 04/18/2015  . Dementia (AD) 11/16/2012  . Chronic anticoagulation 06/30/2011  . Paroxysmal atrial flutter ([place]) 06/04/2011  . 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 SINUSITIS, CHRONIC NEC 12/14/2006  . ABUSE, ALCOHOL, CONTINUOUS 10/11/2006  . History of cardiovascular disorder 10/11/2006  . Essential hypertension 09/15/2006  . Stroke ([place]) 09/15/2006  . ALLERGIC RHINITIS 09/15/2006  . CIRRHOSIS, ALCOHOLIC, LIVER [id number]  . Seizure disorder ([place]) 09/15/2006  . PANCREATITIS, HX OF 09/15/2006    Orientation RESPIRATION BLADDER Height & Weight     Self, Situation, Place  Normal Incontinent Weight: 149 lb 0.5 oz (67.6 kg) Height:  6\' 1"  (185.4 cm)  BEHAVIORAL SYMPTOMS/MOOD NEUROLOGICAL BOWEL NUTRITION STATUS      Incontinent Diet(see discharge summary)  AMBULATORY STATUS COMMUNICATION OF NEEDS Skin   Extensive Assist Verbally Other (Comment)(ecchymosis right hand)                       Personal Care Assistance Level of Assistance  Feeding, Bathing, Dressing, Total care Bathing Assistance: Limited assistance Feeding assistance: Limited assistance(able to feed self - needs set up) Dressing Assistance: Limited assistance Total Care Assistance: Maximum assistance   Functional Limitations Info  Sight, Hearing, Speech Sight Info: Adequate Hearing Info: Adequate Speech Info: Adequate    SPECIAL CARE FACTORS FREQUENCY  PT (By licensed PT), OT (By licensed OT)     PT Frequency: min 5x weekly OT Frequency: min 5x weekly            Contractures Contractures Info: Not present    Additional Factors Info  Code Status, Allergies Code Status Info: full Allergies  Info: No Known Allergies           Current Medications (07/24/2019):  This is the current hospital active medication list Current Facility-Administered Medications  Medication Dose Route  Frequency Provider Last Rate Last Admin  . 0.9 %  sodium chloride infusion   Intravenous Continuous Pahwani, Rinka R, MD 100 mL/hr at 07/24/19 0946 New Bag at 07/24/19 0946  . acetaminophen (TYLENOL) tablet 650 mg  650 mg Oral Q6H PRN Pahwani, Rinka R, MD   650 mg at 07/23/19 1714   Or  . acetaminophen (TYLENOL) suppository 650 mg  650 mg Rectal Q6H PRN Pahwani, Rinka R, MD   650 mg at 07/22/19 0759  . amiodarone (PACERONE) tablet 200 mg  200 mg Oral BID Fransico Him R, MD   200 mg at 07/24/19 0901  . ceFEPIme (MAXIPIME) 2 g in sodium chloride 0.9 % 100 mL IVPB  2 g Intravenous Q8H Mancheril, Darnell Level, RPH 200 mL/hr at 07/24/19 0910 2 g at 07/24/19 0910  . feeding supplement (ENSURE ENLIVE) (ENSURE ENLIVE) liquid 237 mL  237 mL Oral TID BM Hall, Carole N, DO   237 mL at 07/24/19 0905  . ferrous sulfate tablet 325 mg  325 mg Oral Q breakfast Irene Pap N, DO   325 mg at 07/24/19 0901  . folic acid (FOLVITE) tablet 1 mg  1 mg Oral Daily [place], Archie Patten N, DO   1 mg at 07/24/19 0901  . levETIRAcetam (KEPPRA) tablet 1,500 mg  1,500 mg Oral BID Pattricia Boss, MD   1,500 mg at 07/24/19 0901  . multivitamin with minerals tablet 1 tablet  1 tablet Oral Daily Irene Pap N, DO   1 tablet at 07/24/19 0901  . ondansetron (ZOFRAN) tablet 4 mg  4 mg Oral Q6H PRN Pahwani, Rinka R, MD       Or  . ondansetron (ZOFRAN) injection 4 mg  4 mg Intravenous Q6H PRN Pahwani, Rinka R, MD      . pantoprazole (PROTONIX) EC tablet 40 mg  40 mg Oral Daily [place], Carole N, DO   40 mg at 07/24/19 0901  . phenytoin (DILANTIN) ER capsule 200 mg  200 mg Oral Daily Pattricia Boss, MD   200 mg at 07/24/19 0902  . predniSONE (DELTASONE) tablet 1 mg  1 mg Oral Q breakfast Irene Pap N, DO   1 mg at 07/24/19 0901  . sodium chloride 0.9 % bolus 500 mL  500 mL Intravenous Once [place], Archie Patten N, DO      . Warfarin - Pharmacist Dosing Inpatient   Does not apply q1600 Lavenia Atlas [facility]   Stopped at 07/23/19 1600     Discharge  Medications: Please see discharge summary for a list of discharge medications.  Relevant Imaging Results:  Relevant Lab Results:   Additional Information SSN: [id number]  Alberteen Sam, LCSW

## 2019-07-24 NOTE — Progress Notes (Signed)
Passed to messan in report of digoxin just given and tylenol, waiting on meds to make effect.

## 2019-07-24 NOTE — Progress Notes (Signed)
CRITICAL VALUE ALERT  Critical Value:  INR 4.4  Date & Time Notied:  07/24/2019 at 0358  Provider Notified: T. Opyd at [place] Received/Actions taken: Pending

## 2019-07-24 NOTE — Progress Notes (Addendum)
[place] for warfarin, cefepime Indication: atrial fibrillation, CAP  No Known Allergies  Patient Measurements: Height: 6\' 1"  (185.4 cm) Weight: 67.6 kg (149 lb 0.5 oz) IBW/kg (Calculated) : 79.9\  Vital Signs: Temp: 98.3 F (36.8 C) (05/04 1057) Temp Source: Oral (05/04 1057) BP: 105/71 (05/04 1057) Pulse Rate: 92 (05/04 1057)  Labs: Recent Labs    07/21/19 1742 07/22/19 0241 07/22/19 0241 07/23/19 0239 07/24/19 0253  HGB  --  10.5*   < > 8.7* 8.4*  HCT  --  33.0*  --  26.5* 25.1*  PLT  --  216  --  203 205  APTT  --  48*  --   --   --   LABPROT  --  23.6*  --  33.2* 41.0*  INR  --  2.2*  --  3.4* 4.4*  CREATININE  --  0.84  --  0.75 0.70  TROPONINIHS 26*  --   --   --   --    < > = values in this interval not displayed.    Estimated Creatinine Clearance: 70.4 mL/min (by C-G formula based on SCr of 0.7 mg/dL).   Medical History: Past Medical History:  Diagnosis Date  . Hiatal hernia   . HTN (hypertension)    pt denies h/o HTN though it appears he was started on spironolactone during his hospitalization 3/13  . Pancreatitis   . Seizures ([place])   . Stroke ([place])   . Typical atrial flutter ([place]) 3/13    Medications:  Medications Prior to Admission  Medication Sig Dispense Refill Last Dose  . ferrous sulfate 325 (65 FE) MG tablet Take 1 tablet (325 mg total) by mouth 3 (three) times daily with meals. (Patient taking differently: Take 325 mg by mouth daily with breakfast. ) 60 tablet 0 07/20/2019 at Unknown time  . levETIRAcetam (KEPPRA) 500 MG tablet TAKE 3 TABLETS BY MOUTH TWICE A DAY (Patient taking differently: Take 1,500 mg by mouth 2 (two) times daily. ) 540 tablet 3 07/20/2019 at Unknown time  . phenytoin (DILANTIN) 200 MG ER capsule Take 1 capsule (200 mg total) by mouth at bedtime. (Patient taking differently: Take 200 mg by mouth daily. ) 30 capsule 1 07/20/2019 at Unknown time  . predniSONE (DELTASONE) 5 MG tablet Take 1 mg by mouth  daily.    07/20/2019 at Unknown time  . warfarin (COUMADIN) 2.5 MG tablet TAKE AS DIRECTED BY COUMADIN CLINIC. (Patient taking differently: Take 2.5 mg by mouth See admin instructions. TAKE AS DIRECTED BY COUMADIN CLINIC.) 180 tablet 1 07/20/2019 at Unknown time  . folic acid (FOLVITE) 1 MG tablet Take 1 tablet (1 mg total) by mouth daily. (Patient not taking: Reported on 07/21/2019) 90 tablet 1 Not Taking at Unknown time  . pantoprazole (PROTONIX) 40 MG tablet Take 1 tablet (40 mg total) by mouth daily for 30 days. 30 tablet 0   . polyethylene glycol (MIRALAX / GLYCOLAX) 17 g packet Take 17 g by mouth daily as needed for mild constipation. (Patient not taking: Reported on 07/21/2019) 14 each 0 Not Taking at Unknown time    Assessment: 61 YOM admitted with generalized weakness on warfarin at home for Afib. INR therapeutic on admission at 2.2 -INR up to 4.4 (possibly in setting of amiodarone and antibiotics) -hg= 8.4  Home warfarin regimen: 2.5 mg on Mon & Fri; 5 mg on all other days   He is also on cefepime day 4 for possible PNA. Vancomycin  discontinued 5/3 -WBC= 7, tmax= 101, SCr= 0.7, PCT 14>>7.7 -cultures: ngtd  Goal of Therapy:  INR 2-3 Monitor platelets by anticoagulation protocol: Yes   Plan:  -Hold warfarin today -Monitor daily INR -No cefepime dose changes needed -Consider 5-7 days of antibiotics?    Hildred Laser, PharmD Clinical Pharmacist **Pharmacist phone directory can now be found on [email or web address] ([email or web address]).  Listed under [place].

## 2019-07-24 NOTE — Progress Notes (Signed)
  Echocardiogram 2D Echocardiogram has been performed.  Bobbye Charleston 07/24/2019, 2:36 PM

## 2019-07-24 NOTE — Plan of Care (Signed)
  Problem: Health Behavior/Discharge Planning: Goal: Ability to manage health-related needs will improve Outcome: Progressing   Problem: Clinical Measurements: Goal: Diagnostic test results will improve Outcome: Progressing   Problem: Nutrition: Goal: Adequate nutrition will be maintained Outcome: Progressing   Problem: Safety: Goal: Ability to remain free from injury will improve Outcome: Progressing   Problem: Education: Goal: Knowledge of General Education information will improve Description: Including pain rating scale, medication(s)/side effects and non-pharmacologic comfort measures Outcome: Progressing   Problem: Clinical Measurements: Goal: Ability to maintain clinical measurements within normal limits will improve Outcome: Progressing Goal: Will remain free from infection Outcome: Progressing Goal: Respiratory complications will improve Outcome: Progressing Goal: Cardiovascular complication will be avoided Outcome: Progressing   Problem: Activity: Goal: Risk for activity intolerance will decrease Outcome: Progressing   Problem: Coping: Goal: Level of anxiety will decrease Outcome: Progressing   Problem: Elimination: Goal: Will not experience complications related to bowel motility Outcome: Progressing Goal: Will not experience complications related to urinary retention Outcome: Progressing   Problem: Pain Managment: Goal: General experience of comfort will improve Outcome: Progressing   Problem: Skin Integrity: Goal: Risk for impaired skin integrity will decrease Outcome: Progressing

## 2019-07-24 NOTE — TOC Initial Note (Signed)
Transition of Care [facility]) - Initial/Assessment Note    Patient Details  Name: [name] MRN: [id number] Date of Birth: Nov 08, 1940  Transition [facility]) CM/SW Contact:    Alberteen Sam, LCSW Phone Number: 07/24/2019, 9:45 AM  Clinical Narrative:                  CSW spoke with patient's daughter [name] regarding discharge planning and SNF recommendation. [name] will discuss with patient and family to determine what would work best, debating between taking patient home with her in [place] for home health services or SNF. [name] reports if SNF was decision, they would prefer [facility] and gave CSW permission to fax out.   Pending call back from [facility] on discharge decision of SNF vs HH.   Expected Discharge Plan: (TBD) Barriers to Discharge: Continued Medical Work up   Patient Goals and CMS Choice   CMS Medicare.gov Compare Post Acute Care list provided to:: Patient Represenative (must comment)(daughter [name]) Choice offered to / list presented to : Adult Children  Expected Discharge Plan and Services Expected Discharge Plan: (TBD)       Living arrangements for the past 2 months: Single Family Home                                      Prior Living Arrangements/Services Living arrangements for the past 2 months: Single Family Home Lives with:: Self Patient language and need for interpreter reviewed:: Yes Do you feel safe going back to the place where you live?: Yes      Need for Family Participation in Patient Care: Yes (Comment) Care giver support system in place?: Yes (comment)   Criminal Activity/Legal Involvement Pertinent to Current Situation/Hospitalization: No - Comment as needed  Activities of Daily Living Home Assistive Devices/Equipment: Other (Comment) ADL Screening (condition at time of admission) Patient's cognitive ability adequate to safely complete daily activities?: Yes Is the patient deaf or have difficulty hearing?: No Does the  patient have difficulty seeing, even when wearing glasses/contacts?: No Does the patient have difficulty concentrating, remembering, or making decisions?: No Patient able to express need for assistance with ADLs?: Yes Does the patient have difficulty dressing or bathing?: No Independently performs ADLs?: Yes (appropriate for developmental age) Does the patient have difficulty walking or climbing stairs?: Yes Weakness of Legs: Both Weakness of Arms/Hands: Left  Permission Sought/Granted Permission sought to share information with : Case Manager, Customer service manager, Family Supports Permission granted to share information with : Yes, Verbal Permission Granted  Share Information with NAME: [name]  Permission granted to share info w AGENCY: SNFs/Home Health  Permission granted to share info w Relationship: daughter  Permission granted to share info w Contact Information: [phone]  Emotional Assessment Appearance:: Appears stated age     Orientation: : Oriented to Self, Oriented to Place, Oriented to Situation Alcohol / Substance Use: Not Applicable Psych Involvement: No (comment)  Admission diagnosis:  Febrile illness [R50.9] Sepsis ([place]) [A41.9] Community acquired pneumonia of left lung, unspecified part of lung [J18.9] Patient Active Problem List   Diagnosis Date Noted  . Sepsis ([place]) 07/21/2019  . Total bilirubin, elevated   . Dilantin toxicity, accidental or unintentional, initial encounter 10/20/2018  . Dilantin overdose, accidental or unintentional, initial encounter 10/20/2018  . GIB (gastrointestinal bleeding) 07/22/2018  . Current chronic use of systemic steroids 01/11/2017  . RBBB 01/11/2017  . Polymyalgia ([place]) 01/11/2017  .  Fever 01/07/2017  . GI bleeding 01/06/2017  . History of CVA in adulthood 01/06/2017  . Retroperitoneal hematoma 01/06/2017  . Pressure injury of skin 12/13/2016  . Fall   . Closed fracture of femur, intertrochanteric, left,  initial encounter ([place]) 12/06/2016  . Severe protein-calorie malnutrition ([place]) 04/03/2016  . Hypotension 04/01/2016  . Leukopenia 03/31/2016  . Elevated INR 03/31/2016  . Hypokalemia 03/31/2016  . Acute pain of right knee 03/01/2016  . CAP (community acquired pneumonia) 12/01/2015  . Aspiration pneumonia ([place]) 12/01/2015  . Erosive gastropathy: Per EGD 11/30/2015 11/30/2015  . Normochromic anemia 11/28/2015  . Localization-related symptomatic epilepsy and epileptic syndromes with complex partial seizures, not intractable, without status epilepticus ([place]) 07/07/2015  . Closed Colles' fracture of right radius with routine healing 05/02/2015  . Pain 04/18/2015  . Dementia (AD) 11/16/2012  . Chronic anticoagulation 06/30/2011  . Paroxysmal atrial flutter ([place]) 06/04/2011  . 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 SINUSITIS, CHRONIC NEC 12/14/2006  . ABUSE, ALCOHOL, CONTINUOUS 10/11/2006  . History of cardiovascular disorder 10/11/2006  . Essential hypertension 09/15/2006  . Stroke ([place]) 09/15/2006  . ALLERGIC RHINITIS 09/15/2006  . CIRRHOSIS, ALCOHOLIC, LIVER [id number]  . Seizure disorder ([place]) 09/15/2006  . PANCREATITIS, HX OF 09/15/2006   PCP:  [name], MD Pharmacy:   [id number] - [place], [place] [address] [place] [address] Phone: [phone] Fax: [phone]  [id number] - [place], [place] - [address] [place] [address] Phone: [phone] Fax: [phone]  [facility] [place] [place] - [address] [place] [address] Phone: [phone] Fax: [phone]     Social Determinants of Health (SDOH) Interventions    Readmission Risk Interventions Readmission Risk Prevention Plan 07/25/2018  Transportation Screening Complete  PCP or Specialist Appt within 5-7 Days Complete  Home Care  Screening Complete  Medication Review (RN CM) Complete  Some recent data might be hidden

## 2019-07-24 NOTE — Progress Notes (Signed)
   07/23/19 1813  Assess: MEWS Score  Temp (!) 101 F (38.3 C)  BP (!) 149/72  Pulse Rate (!) 123  ECG Heart Rate (!) 123  Resp (!) 28  Level of Consciousness Alert  SpO2 94 %  O2 Device Room Air  Assess: MEWS Score  MEWS Temp 1  MEWS Systolic 0  MEWS Pulse 2  MEWS RR 2  MEWS LOC 0  MEWS Score 5  MEWS Score Color Red  Assess: if the MEWS score is Yellow or Red  Were vital signs taken at a resting state? Yes  Focused Assessment Documented focused assessment  Early Detection of Sepsis Score *See Row Information* High  MEWS guidelines implemented *See Row Information* Yes  Treat  MEWS Interventions Other (Comment) (waiting on meds)  Take Vital Signs  Increase Vital Sign Frequency  Red: Q 1hr X 4 then Q 4hr X 4, if remains red, continue Q 4hrs  Notify: Provider  Provider Name/Title dr hall  Date Provider Notified 07/23/19  Time Provider Notified 1800  Notification Type Page  Notification Reason Change in status  Response Other (Comment) (waiting on pharmacy to send digoxin po, MD will call cards t)  Date of Provider Response 07/23/19  Time of Provider Response 1800  Document  Patient Outcome Not stable and remains on department  called dr hall regarding pt's HR and temp, tylenol given waiting on pharmacy to send digoxin, sent text to pharmacy and called.

## 2019-07-24 NOTE — Plan of Care (Signed)
  Problem: Nutrition: Goal: Adequate nutrition will be maintained Outcome: Progressing   Problem: Safety: Goal: Ability to remain free from injury will improve Outcome: Progressing   Problem: Clinical Measurements: Goal: Ability to maintain clinical measurements within normal limits will improve Outcome: Progressing Goal: Respiratory complications will improve Outcome: Progressing Goal: Cardiovascular complication will be avoided Outcome: Progressing   Problem: Activity: Goal: Risk for activity intolerance will decrease Outcome: Progressing

## 2019-07-25 ENCOUNTER — Inpatient Hospital Stay (HOSPITAL_COMMUNITY): Payer: Medicare Other

## 2019-07-25 DIAGNOSIS — I4892 Unspecified atrial flutter: Secondary | ICD-10-CM | POA: Diagnosis not present

## 2019-07-25 DIAGNOSIS — I10 Essential (primary) hypertension: Secondary | ICD-10-CM | POA: Diagnosis not present

## 2019-07-25 DIAGNOSIS — A419 Sepsis, unspecified organism: Principal | ICD-10-CM

## 2019-07-25 DIAGNOSIS — G40909 Epilepsy, unspecified, not intractable, without status epilepticus: Secondary | ICD-10-CM

## 2019-07-25 DIAGNOSIS — J189 Pneumonia, unspecified organism: Secondary | ICD-10-CM

## 2019-07-25 DIAGNOSIS — I4510 Unspecified right bundle-branch block: Secondary | ICD-10-CM | POA: Diagnosis not present

## 2019-07-25 LAB — BASIC METABOLIC PANEL
Anion gap: 4 — ABNORMAL LOW (ref 5–15)
BUN: 9 mg/dL (ref 8–23)
CO2: 20 mmol/L — ABNORMAL LOW (ref 22–32)
Calcium: 7.7 mg/dL — ABNORMAL LOW (ref 8.9–10.3)
Chloride: 116 mmol/L — ABNORMAL HIGH (ref 98–111)
Creatinine, Ser: 0.71 mg/dL (ref 0.61–1.24)
GFR calc Af Amer: >60 mL/min (ref >60)
GFR calc non Af Amer: >60 mL/min (ref >60)
Glucose, Bld: 110 mg/dL — ABNORMAL HIGH (ref 70–99)
Potassium: 3.6 mmol/L (ref 3.5–5.1)
Sodium: 140 mmol/L (ref 135–145)

## 2019-07-25 LAB — CBC WITH DIFFERENTIAL/PLATELET
Abs Immature Granulocytes: 0.09 10*3/uL — ABNORMAL HIGH (ref 0.00–0.07)
Basophils Absolute: 0.1 10*3/uL (ref 0.0–0.1)
Basophils Relative: 1 %
Eosinophils Absolute: 0.2 10*3/uL (ref 0.0–0.5)
Eosinophils Relative: 5 %
HCT: 24.1 % — ABNORMAL LOW (ref 39.0–52.0)
Hemoglobin: 8.1 g/dL — ABNORMAL LOW (ref 13.0–17.0)
Immature Granulocytes: 2 %
Lymphocytes Relative: 12 %
Lymphs Abs: 0.6 10*3/uL — ABNORMAL LOW (ref 0.7–4.0)
MCH: 30.1 pg (ref 26.0–34.0)
MCHC: 33.6 g/dL (ref 30.0–36.0)
MCV: 89.6 fL (ref 80.0–100.0)
Monocytes Absolute: 1 10*3/uL (ref 0.1–1.0)
Monocytes Relative: 21 %
Neutro Abs: 2.9 10*3/uL (ref 1.7–7.7)
Neutrophils Relative %: 59 %
Platelets: 237 10*3/uL (ref 150–400)
RBC: 2.69 MIL/uL — ABNORMAL LOW (ref 4.22–5.81)
RDW: 25.4 % — ABNORMAL HIGH (ref 11.5–15.5)
WBC: 4.9 10*3/uL (ref 4.0–10.5)
nRBC: 0 % (ref 0.0–0.2)

## 2019-07-25 LAB — PROTIME-INR
INR: 4.8 (ref 0.8–1.2)
Prothrombin Time: 43.7 seconds — ABNORMAL HIGH (ref 11.4–15.2)

## 2019-07-25 LAB — SARS CORONAVIRUS 2 (TAT 6-24 HRS): SARS Coronavirus 2: NEGATIVE

## 2019-07-25 NOTE — Plan of Care (Signed)
  Problem: Health Behavior/Discharge Planning: Goal: Ability to manage health-related needs will improve Outcome: Progressing   Problem: Clinical Measurements: Goal: Diagnostic test results will improve Outcome: Progressing   Problem: Nutrition: Goal: Adequate nutrition will be maintained Outcome: Progressing   Problem: Safety: Goal: Ability to remain free from injury will improve Outcome: Progressing   Problem: Education: Goal: Knowledge of General Education information will improve Description: Including pain rating scale, medication(s)/side effects and non-pharmacologic comfort measures Outcome: Progressing   Problem: Clinical Measurements: Goal: Ability to maintain clinical measurements within normal limits will improve Outcome: Progressing Goal: Will remain free from infection Outcome: Progressing Goal: Respiratory complications will improve Outcome: Progressing Goal: Cardiovascular complication will be avoided Outcome: Progressing   Problem: Activity: Goal: Risk for activity intolerance will decrease Outcome: Progressing   Problem: Coping: Goal: Level of anxiety will decrease Outcome: Progressing   Problem: Elimination: Goal: Will not experience complications related to bowel motility Outcome: Progressing Goal: Will not experience complications related to urinary retention Outcome: Progressing   Problem: Pain Managment: Goal: General experience of comfort will improve Outcome: Progressing   Problem: Skin Integrity: Goal: Risk for impaired skin integrity will decrease Outcome: Progressing

## 2019-07-25 NOTE — Progress Notes (Signed)
Inpatient Rehabilitation-Admissions Coordinator   Met with pt bedside for rehab assessment. Pt did not appear interested in speaking with me about IP Rehab program and does not feel he is ready for any therapy at this time. Per PT and OT evals, they are recommending SNF currently. Based on brief visit with pt, not sure he will want to participate in an intensive rehab program at this time. AC will attempt to follow up tomorrow for further discussion to see if IP Rehab is the most appropriate placement.   Raechel Ache, OTR/L  Rehab Admissions Coordinator  [phone] 07/25/2019 11:34 AM

## 2019-07-25 NOTE — Progress Notes (Signed)
  Speech Language Pathology Treatment: Dysphagia  Patient Details Name: [name] MRN: [id number] DOB: February 07, 1941 Today's Date: 07/25/2019 Time: 6431-4276 SLP Time Calculation (min) (ACUTE ONLY): 22 min  Assessment / Plan / Recommendation Clinical Impression  Pt was seen for dysphagia treatment to assess tolerance of the recommended diet. Pt reported that he has been consuming the food without difficulty and continues to deny any baseline difficulty. Pt's daughter called during this session and she reported that the pt occasionally has a "little cough" when he eats more so than drinks but "nothing very concerning" and stated that he has not had pneumonia prior. Pt demonstrated throat clearing at baseline but this was not observed following intake of thin liquids via cup. The possibility of conducting a modified barium swallow study to rule out possible penetration/aspiration of pharyngeal residue with solids was discussed with the pt and his daughter. However, both parties indicated that they would like to defer this and SLP is in agreement with this plan. SLP services will therefore be discontinued at this time and it is recommended that the current diet be continued.    HPI HPI: Pt is an 79 y.o. male with medical history significant of hypertension, seizure disorder, stroke, atrial flutter-on Coumadin, dementia, GERD who presented to the ED due to generalized weakness. CXR 5/1: Mild consolidative opacities at the left lung base may represent atelectasis, aspiration, or pneumonia. Esophagram: Subcentimeter epiphrenic diverticulum.      SLP Plan  Discharge SLP treatment due to (comment);All goals met       Recommendations  Diet recommendations: Regular;Thin liquid Liquids provided via: Cup;Straw Medication Administration: Crushed with puree Supervision: Patient able to self feed                Plan: Discharge SLP treatment due to (comment);All goals met       Alyah Boehning I.  Hardin Negus, [place], [place] Office number [phone] Pager [place] 07/25/2019, 4:43 PM

## 2019-07-25 NOTE — Consult Note (Signed)
[facility] Gastroenterology Consult  Referring Provider: No ref. provider found Primary Care Physician:  [name], MD Primary Gastroenterologist: Dr.Lonzie Simmer/[facility]  Reason for Consultation: Abnormal CAT scan   HPI: Nature Braverman is a 79 y.o. male was referred for abnormal CAT scan-which showed mild diffuse bladder wall thickening, mild wall thickening at splenic flexure without adjacent inflammation, nonspecific however underlying neoplasm cannot be excluded and consider follow-up colonoscopy.  Significant colonic tortuosity and redundancy with moderate stool burden and multifocal diverticulosis noted.  As per his nurse he had liquid watery brown stools, one episode on Monday, 1 episode yesterday and no bowel movements today.  There has been no blood in stools or black stools.  Most of the history is obtained from documentation as patient denies any symptoms at all and is noted to have dementia. Although he is alert, oriented to time, place and person currently, he denies abdominal pain diarrhea or weight loss, whereas on documentation he has lost about 20 pounds in the last 2 to 3 months, had history of 2 to 3 weeks of diarrhea and complains of abdominal pain on admission.  Spoke with his daughter [name] [phone].  He was last seen as a televisit in July 2020. EGD by Dr. Alessandra Bevels from 07/2018 showed gastric AVMs, treated with APC, AVM in duodenum, treated with APC. Capsule endoscopy from 07/24/2018 showed multiple small to diminutive nonbleeding AVMs in stomach and duodenum, multiple nonbleeding xanthomas. Colonoscopy by Dr. Michail Sermon from 2016 for iron deficiency anemia showed diverticulosis in sigmoid and internal hemorrhoids, otherwise unremarkable, repeat colonoscopy not recommended due to age. Due to his age and requirement for chronic anticoagulation, I had recommended periodic IV iron infusions with the intention to keep hemoglobin around 9-10. Patient received 1 dose of IV  iron infusion, thereafter due to Covid pandemic did not follow-up for further iron infusions.    Past Medical History:  Diagnosis Date  . Hiatal hernia   . HTN (hypertension)    pt denies h/o HTN though it appears he was started on spironolactone during his hospitalization 3/13  . Pancreatitis   . Seizures ([place])   . Stroke ([place])   . Typical atrial flutter ([place]) 3/13    Past Surgical History:  Procedure Laterality Date  . ESOPHAGOGASTRODUODENOSCOPY N/A 11/30/2015   Procedure: ESOPHAGOGASTRODUODENOSCOPY (EGD);  Surgeon: Wonda Horner, MD;  Location: [facility] ENDOSCOPY;  Service: Endoscopy;  Laterality: N/A;  . ESOPHAGOGASTRODUODENOSCOPY (EGD) WITH PROPOFOL N/A 07/23/2018   Procedure: ESOPHAGOGASTRODUODENOSCOPY (EGD) WITH PROPOFOL;  Surgeon: Otis Brace, MD;  Location: [facility] ENDOSCOPY;  Service: Gastroenterology;  Laterality: N/A;  . FEMUR IM NAIL Left 12/08/2016  . FEMUR IM NAIL Left 12/07/2016   Procedure: INTRAMEDULLARY (IM) NAIL FEMORAL;  Surgeon: Renette Butters, MD;  Location: [place];  Service: Orthopedics;  Laterality: Left;  . GIVENS CAPSULE STUDY N/A 07/24/2018   Procedure: GIVENS CAPSULE STUDY;  Surgeon: Otis Brace, MD;  Location: [place];  Service: Gastroenterology;  Laterality: N/A;  . HIP SURGERY Left 01/2017  . HOT HEMOSTASIS N/A 07/23/2018   Procedure: HOT HEMOSTASIS (ARGON PLASMA COAGULATION/BICAP);  Surgeon: Otis Brace, MD;  Location: [facility] ENDOSCOPY;  Service: Gastroenterology;  Laterality: N/A;  . no surgical history  06/2011    Prior to Admission medications   Medication Sig Start Date End Date Taking? Authorizing Provider  ferrous sulfate 325 (65 FE) MG tablet Take 1 tablet (325 mg total) by mouth 3 (three) times daily with meals. Patient taking differently: Take 325 mg by mouth daily with breakfast.  12/15/16  Yes Velvet Bathe, MD  levETIRAcetam (KEPPRA) 500 MG tablet TAKE 3 TABLETS BY MOUTH TWICE A DAY Patient taking differently: Take 1,500 mg by mouth 2  (two) times daily.  07/28/18  Yes Cameron Sprang, MD  phenytoin (DILANTIN) 200 MG ER capsule Take 1 capsule (200 mg total) by mouth at bedtime. Patient taking differently: Take 200 mg by mouth daily.  10/24/18  Yes Mercy Riding, MD  predniSONE (DELTASONE) 5 MG tablet Take 1 mg by mouth daily.  05/19/19  Yes [provider]  warfarin (COUMADIN) 2.5 MG tablet TAKE AS DIRECTED BY COUMADIN CLINIC. Patient taking differently: Take 2.5 mg by mouth See admin instructions. TAKE AS DIRECTED BY COUMADIN CLINIC. 04/23/19  Yes Allred, Jeneen Rinks, MD  folic acid (FOLVITE) 1 MG tablet Take 1 tablet (1 mg total) by mouth daily. Patient not taking: Reported on 07/21/2019 10/25/18   Mercy Riding, MD  pantoprazole (PROTONIX) 40 MG tablet Take 1 tablet (40 mg total) by mouth daily for 30 days. 07/26/18 10/20/18  Arrien, Jimmy Picket, MD  polyethylene glycol (MIRALAX / GLYCOLAX) 17 g packet Take 17 g by mouth daily as needed for mild constipation. Patient not taking: Reported on 07/21/2019 10/24/18   Mercy Riding, MD    Current Facility-Administered Medications  Medication Dose Route Frequency Provider Last Rate Last Admin  . 0.9 %  sodium chloride infusion   Intravenous Continuous Hosie Poisson, MD 50 mL/hr at 07/25/19 1224 Rate Change at 07/25/19 1224  . acetaminophen (TYLENOL) tablet 650 mg  650 mg Oral Q6H PRN Pahwani, Rinka R, MD   650 mg at 07/23/19 1714   Or  . acetaminophen (TYLENOL) suppository 650 mg  650 mg Rectal Q6H PRN Pahwani, Rinka R, MD   650 mg at 07/22/19 0759  . amiodarone (PACERONE) tablet 200 mg  200 mg Oral BID Fransico Him R, MD   200 mg at 07/25/19 1023  . ceFEPIme (MAXIPIME) 2 g in sodium chloride 0.9 % 100 mL IVPB  2 g Intravenous Q8H MancherilDarnell Level, RPH 200 mL/hr at 07/25/19 0924 2 g at 07/25/19 0924  . feeding supplement (ENSURE ENLIVE) (ENSURE ENLIVE) liquid 237 mL  237 mL Oral TID BM Hall, Carole N, DO   237 mL at 07/25/19 1405  . ferrous sulfate tablet 325 mg  325 mg Oral Q  breakfast Irene Pap N, DO   325 mg at 07/25/19 1023  . folic acid (FOLVITE) tablet 1 mg  1 mg Oral Daily Bucklin, Carole N, DO   1 mg at 07/25/19 1023  . levETIRAcetam (KEPPRA) tablet 1,500 mg  1,500 mg Oral BID Pattricia Boss, MD   1,500 mg at 07/25/19 1023  . multivitamin with minerals tablet 1 tablet  1 tablet Oral Daily Irene Pap N, DO   1 tablet at 07/25/19 1023  . ondansetron (ZOFRAN) tablet 4 mg  4 mg Oral Q6H PRN Pahwani, Rinka R, MD       Or  . ondansetron (ZOFRAN) injection 4 mg  4 mg Intravenous Q6H PRN Pahwani, Rinka R, MD      . pantoprazole (PROTONIX) EC tablet 40 mg  40 mg Oral Daily Hall, Carole N, DO   40 mg at 07/25/19 1023  . phenytoin (DILANTIN) ER capsule 200 mg  200 mg Oral Daily Pattricia Boss, MD   200 mg at 07/25/19 1022  . predniSONE (DELTASONE) tablet 1 mg  1 mg Oral Q breakfast Hall, Carole N, DO   1 mg  at 07/25/19 1023  . sodium chloride 0.9 % bolus 500 mL  500 mL Intravenous Once [place], Archie Patten N, DO      . Warfarin - Pharmacist Dosing Inpatient   Does not apply q1600 Lavenia Atlas, [facility]   Stopped at 07/23/19 1600    Allergies as of 07/21/2019  . (No Known Allergies)    Family History  Problem Relation Age of Onset  . Hypertension Other     Social History   Socioeconomic History  . Marital status: Divorced    Spouse name: Not on file  . Number of children: Not on file  . Years of education: Not on file  . Highest education level: Not on file  Occupational History  . Not on file  Tobacco Use  . Smoking status: Former Smoker    Quit date: 07/15/1966    Years since quitting: 53.0  . Smokeless tobacco: Never Used  Substance and Sexual Activity  . Alcohol use: No    Comment: Quit greater than 5 years ago  . Drug use: No  . Sexual activity: Not on file  Other Topics Concern  . Not on file  Social History Narrative   Lives alone.  Retired from [employer]   Social Determinants of Radio broadcast assistant Strain:   . Difficulty of  Paying Living Expenses:   Food Insecurity:   . Worried About Charity fundraiser in the Last Year:   . Arboriculturist in the Last Year:   Transportation Needs:   . Film/video editor (Medical):   Marland Kitchen Lack of Transportation (Non-Medical):   Physical Activity:   . Days of Exercise per Week:   . Minutes of Exercise per Session:   Stress:   . Feeling of Stress :   Social Connections:   . Frequency of Communication with Friends and Family:   . Frequency of Social Gatherings with Friends and Family:   . Attends Religious Services:   . Active Member of Clubs or Organizations:   . Attends Archivist Meetings:   Marland Kitchen Marital Status:   Intimate Partner Violence:   . Fear of Current or Ex-Partner:   . Emotionally Abused:   Marland Kitchen Physically Abused:   . Sexually Abused:     Review of Systems: Positive for: GI: Described in detail in HPI.    Gen: anorexia, fatigue, weakness, malaise, involuntary weight loss, denies any fever, chills, rigors, night sweats, and sleep disorder CV: Denies chest pain, angina, palpitations, syncope, orthopnea, PND, peripheral edema, and claudication. Resp: Denies dyspnea, cough, sputum, wheezing, coughing up blood. GU : Denies urinary burning, blood in urine, urinary frequency, urinary hesitancy, nocturnal urination, and urinary incontinence. MS: Denies joint pain or swelling.  Denies muscle weakness, cramps, atrophy.  Derm: Denies rash, itching, oral ulcerations, hives, unhealing ulcers.  Psych: confusion ,denies depression, anxiety, memory loss, suicidal ideation, hallucinations. Heme: Denies bruising, bleeding, and enlarged lymph nodes. Neuro:  Denies any headaches, dizziness, paresthesias. Endo:  Denies any problems with DM, thyroid, adrenal function.  Physical Exam: Vital signs in last 24 hours: Temp:  [97.8 F (36.6 C)-99.1 F (37.3 C)] 98 F (36.7 C) (05/05 1200) Pulse Rate:  [82-114] 92 (05/05 1200) Resp:  [15-20] 20 (05/05 1200) BP:  (96-117)/(53-79) 111/62 (05/05 1200) SpO2:  [94 %-98 %] 96 % (05/05 1200) Weight:  [69.4 kg] 69.4 kg (05/05 0444) Last BM Date: 07/24/19  General:   Alert,  Well-developed, well-nourished, pleasant and cooperative in NAD Head:  Normocephalic and atraumatic. Eyes:  Sclera clear, no icterus.   Mild pallor Ears:  Normal auditory acuity. Nose:  No deformity, discharge,  or lesions. Mouth:  No deformity or lesions.  Oropharynx pink & moist. Neck:  Supple; no masses or thyromegaly. Lungs:  Clear throughout to auscultation.   No wheezes, crackles, or rhonchi. No acute distress. Heart:  Regular rate and rhythm; no murmurs, clicks, rubs,  or gallops. Extremities:  Without clubbing or edema. Neurologic:  Alert and  oriented x3;  grossly normal neurologically. Skin:  Intact without significant lesions or rashes. Psych:  Alert and cooperative. Normal mood and affect. Abdomen:  Soft, nontender and nondistended. No masses, hepatosplenomegaly or hernias noted. Normal bowel sounds, without guarding, and without rebound.         Lab Results: Recent Labs    07/23/19 0239 07/24/19 0253 07/25/19 0257  WBC 10.8* 7.0 4.9  HGB 8.7* 8.4* 8.1*  HCT 26.5* 25.1* 24.1*  PLT 203 205 237   BMET Recent Labs    07/23/19 0239 07/24/19 0253 07/25/19 0257  NA 135 136 140  K 4.1 3.5 3.6  CL 107 114* 116*  CO2 21* 17* 20*  GLUCOSE 138* 112* 110*  BUN 14 9 9   CREATININE 0.75 0.70 0.71  CALCIUM 7.7* 7.4* 7.7*   LFT No results for input(s): PROT, ALBUMIN, AST, ALT, ALKPHOS, BILITOT, BILIDIR, IBILI in the last 72 hours. PT/INR Recent Labs    07/24/19 0253 07/25/19 0257  LABPROT 41.0* 43.7*  INR 4.4* 4.8*    Studies/Results: DG CHEST PORT 1 VIEW  Result Date: 07/25/2019 CLINICAL DATA:  Pleural effusion EXAM: PORTABLE CHEST 1 VIEW COMPARISON:  Jul 21, 2019. FINDINGS: There are bilateral pleural effusions, increased on the left and new on the right. There is atelectatic change in each lung base.  Heart size and pulmonary vascularity are normal. No adenopathy. Bones are osteoporotic. IMPRESSION: Pleural effusions bilaterally, increased on the left and new on the right compared to most recent study. No appreciable airspace consolidation. Stable cardiac silhouette. No adenopathy evident. Electronically Signed   By: Lowella Grip III [name]   On: 07/25/2019 13:53   ECHOCARDIOGRAM COMPLETE  Result Date: 07/24/2019    ECHOCARDIOGRAM REPORT   Patient Name:   LUCAS KILBARGER Heinicke Date of Exam: 07/24/2019 Medical Rec #:  [id number]            Height:       73.0 in Accession #:    [id number]           Weight:       149.0 lb Date of Birth:  09-24-1940             BSA:          1.899 m Patient Age:    70 years             BP:           109/63 mmHg Patient Gender: M                    HR:           96 bpm. Exam Location:  Inpatient Procedure: 2D Echo, Cardiac Doppler and Color Doppler Indications:    I48.92* Unspecified atrial flutter  History:        Patient has prior history of Echocardiogram examinations, most                 recent 06/03/2011. Abnormal ECG, Arrythmias:Atrial Flutter and  RBBB; Signs/Symptoms:Altered Mental Status, Alzheimer's and                 Bacteremia.  Sonographer:    Roseanna Rainbow RDCS Referring Phys: [place]  1. Left ventricular ejection fraction, by estimation, is 60 to 65%. The left ventricle has normal function. The left ventricle has no regional wall motion abnormalities. Left ventricular diastolic function could not be evaluated.  2. Right ventricular systolic function is normal. The right ventricular size is normal.  3. Right atrial size was mildly dilated.  4. Moderate pleural effusion in both left and right lateral regions.  5. The mitral valve is normal in structure. Trivial mitral valve regurgitation. No evidence of mitral stenosis.  6. The aortic valve is grossly normal. Aortic valve regurgitation is not visualized. No aortic stenosis is present.   7. Aortic dilatation noted. There is mild dilatation of the ascending aorta measuring 39 mm.  8. The inferior vena cava is normal in size with greater than 50% respiratory variability, suggesting right atrial pressure of 3 mmHg. Comparison(s): No significant change from prior study. FINDINGS  Left Ventricle: Left ventricular ejection fraction, by estimation, is 60 to 65%. The left ventricle has normal function. The left ventricle has no regional wall motion abnormalities. The left ventricular internal cavity size was normal in size. There is  no left ventricular hypertrophy. Left ventricular diastolic function could not be evaluated due to atrial fibrillation. Left ventricular diastolic function could not be evaluated. Right Ventricle: The right ventricular size is normal. No increase in right ventricular wall thickness. Right ventricular systolic function is normal. Left Atrium: Left atrial size was normal in size. Right Atrium: Right atrial size was mildly dilated. Pericardium: A small pericardial effusion is present. Mitral Valve: The mitral valve is normal in structure. Trivial mitral valve regurgitation. No evidence of mitral valve stenosis. Tricuspid Valve: The tricuspid valve is normal in structure. Tricuspid valve regurgitation is trivial. No evidence of tricuspid stenosis. Aortic Valve: The aortic valve is grossly normal. Aortic valve regurgitation is not visualized. No aortic stenosis is present. Pulmonic Valve: The pulmonic valve was not well visualized. Pulmonic valve regurgitation is not visualized. No evidence of pulmonic stenosis. Aorta: Aortic dilatation noted. There is mild dilatation of the ascending aorta measuring 39 mm. Venous: The inferior vena cava is normal in size with greater than 50% respiratory variability, suggesting right atrial pressure of 3 mmHg. IAS/Shunts: No atrial level shunt detected by color flow Doppler. Additional Comments: There is a moderate pleural effusion in both left and  right lateral regions.  LEFT VENTRICLE PLAX 2D LVIDd:         3.70 cm LVIDs:         2.50 cm LV PW:         1.20 cm LV IVS:        1.10 cm LVOT diam:     1.70 cm LV SV:         54 LV SV Index:   28 LVOT Area:     2.27 cm  LV Volumes (MOD) LV vol d, MOD A2C: 37.0 ml LV vol d, MOD A4C: 39.9 ml LV vol s, MOD A2C: 12.9 ml LV vol s, MOD A4C: 17.5 ml LV SV MOD A2C:     24.1 ml LV SV MOD A4C:     39.9 ml LV SV MOD BP:      23.2 ml RIGHT VENTRICLE         IVC TAPSE (  M-mode): 2.1 cm  IVC diam: 1.50 cm LEFT ATRIUM             Index       RIGHT ATRIUM           Index LA diam:        2.90 cm 1.53 cm/m  RA Area:     18.90 cm LA Vol (A2C):   59.5 ml 31.34 ml/m RA Volume:   58.20 ml  30.65 ml/m LA Vol (A4C):   41.1 ml 21.65 ml/m LA Biplane Vol: 50.7 ml 26.70 ml/m  AORTIC VALVE LVOT Vmax:   156.00 cm/s LVOT Vmean:  95.100 cm/s LVOT VTI:    0.238 m  AORTA Ao Root diam: 3.60 cm Ao Asc diam:  3.90 cm MITRAL VALVE MV Area (PHT): 4.15 cm     SHUNTS MV Decel Time: 183 msec     Systemic VTI:  0.24 m MV E velocity: 111.00 cm/s  Systemic Diam: 1.70 cm Buford Dresser MD Electronically signed by Buford Dresser MD Signature Date/Time: 07/24/2019/7:18:25 PM    Final    DG ESOPHAGUS W SINGLE CM (SOL OR THIN BA)  Result Date: 07/25/2019 CLINICAL DATA:  Throat clearing, globus sensation EXAM: ESOPHOGRAM/BARIUM SWALLOW TECHNIQUE: Single contrast examination was performed using barium. FLUOROSCOPY TIME:  Fluoroscopy Time:  54 seconds COMPARISON:  None. FINDINGS: Patient swallowed barium without difficulty. There is no mass, stricture, or other abnormality identified. Normal motility. No hiatal hernia. There is a subcentimeter epiphrenic diverticulum. No evidence of spontaneous gastroesophageal reflux. IMPRESSION: Subcentimeter epiphrenic diverticulum. Otherwise unremarkable study. Electronically Signed   By: Macy Mis [name]   On: 07/25/2019 10:28    Impression: Splenic flexure wall thickening noted on CAT scan from  07/21/2019 performed at 1724 Surprisingly, a CAT scan was also performed at 1221 on 07/21/2018 which did not show any splenic flexure wall thickening.  Last colonoscopy was performed in 2016 which showed diverticulosis, repeat colonoscopy not recommended due to age.  Presented with symptoms of sepsis, was given IV vancomycin and cefepime and currently on IV cefepime, pleural effusion noted on chest x-ray from 07/25/2019 WBC has normalized  Other comorbidities include persistent atrial fibrillation/atrial flutter with RVR-elevated PT/INR of 43.7/4.8 Dementia History of stroke History of seizure  Plan: Discussed about CAT scan findings with the patient, his daughter [name] over the phone and with the hospitalist Dr. Karleen Hampshire. Due to his age, elevated PT/INR, need for Coumadin and multiple comorbidities, recommend repeating CAT scan in 2 to 3 weeks as an outpatient as most likely thickening noted in splenic flexure is nonspecific or could be related to underdistention. To be noted- CAT scan performed 5 hours earlier on the same day, with contrast did not show such findings. We will follow patient on discharge in 2 to 3 weeks and will get a CAT scan as an outpatient in 2 to 3 weeks. At present no plans for endoscopic intervention/colonoscopy. Please recall GI if needed.   LOS: 4 days   Ronnette Juniper, MD  07/25/2019, 2:14 PM

## 2019-07-25 NOTE — TOC Progression Note (Signed)
Transition of Care [facility]) - Progression Note    Patient Details  Name: [name] MRN: [id number] Date of Birth: 1940-04-02  Transition of Care [name]) CM/SW [place], [place] Phone Number: 07/25/2019, 12:34 PM  Clinical Narrative:     CSW spoke with patient's daughter [name] regarding dc plan as patient may be medically stable to dc tomorrow per MD, [name] states she has not decided on SNF vs HH yet as she would like to speak with [facility] admissions. CIR is preference. CSW spoke with Claiborne Billings with CIR who reports she will talk with [name] and reiterate that SNF level of care is recommended at this time. [name] has not given CSW permission to fax out for additional bed offers at this time, pending discussion with CIR admissions.   Expected Discharge Plan: (TBD) Barriers to Discharge: Continued Medical Work up  Expected Discharge Plan and Services Expected Discharge Plan: (TBD)       Living arrangements for the past 2 months: Single Family Home                                       Social Determinants of Health (SDOH) Interventions    Readmission Risk Interventions Readmission Risk Prevention Plan 07/25/2018  Transportation Screening Complete  PCP or Specialist Appt within 5-7 Days Complete  Home Care Screening Complete  Medication Review (RN CM) Complete  Some recent data might be hidden

## 2019-07-25 NOTE — Progress Notes (Signed)
Inpatient Rehabilitation-Admissions Coordinator   With pt permission I spoke to his daughter [name] on the phone regarding rehab recommendations. [name] conveyed that her father is typically resistant to most ideas of rehab until he has had time to digest it and discuss it with her. [name] had spoken with her father about the IP Rehab program right after my initial visit this AM and reports he is much more agreeable to CIR now. I re-visited [name] and he was much more receptive to the idea of a therapy program and did agree to participate in therapy up to 3 hrs/day. [name] confirmed a stable DC plan to her house so she can provide 24/7 Supervision, with pt agreeing to this plan. [name] prefers CIR over SNF and this [facility] does think the patient would benefit from the consistency and multidisciplinary care that CIR offers. AC will follow for medical readiness and bed availability for possible admit.   Raechel Ache, OTR/L  Rehab Admissions Coordinator  [phone] 07/25/2019 4:20 PM

## 2019-07-25 NOTE — Progress Notes (Signed)
Progress Note  Patient Name: [name] Date of Encounter: 07/25/2019  Primary Cardiologist: No primary care provider on file.   Subjective   No chest pain or SOB. Appears to have converted to NSR on tele.  Very upset that he is NPO  Inpatient Medications    Scheduled Meds: . amiodarone  200 mg Oral BID  . feeding supplement (ENSURE ENLIVE)  237 mL Oral TID BM  . ferrous sulfate  325 mg Oral Q breakfast  . folic acid  1 mg Oral Daily  . levETIRAcetam  1,500 mg Oral BID  . multivitamin with minerals  1 tablet Oral Daily  . pantoprazole  40 mg Oral Daily  . phenytoin  200 mg Oral Daily  . predniSONE  1 mg Oral Q breakfast  . Warfarin - Pharmacist Dosing Inpatient   Does not apply q1600   Continuous Infusions: . sodium chloride 100 mL/hr at 07/24/19 2125  . ceFEPime (MAXIPIME) IV 2 g (07/25/19 0236)  . sodium chloride     PRN Meds: acetaminophen **OR** acetaminophen, ondansetron **OR** ondansetron (ZOFRAN) IV   Vital Signs    Vitals:   07/24/19 1800 07/24/19 1900 07/24/19 2357 07/25/19 0444  BP: 98/60 (!) 100/57    Pulse: 82 91    Resp: 15 18    Temp:  98.7 F (37.1 C) 98.5 F (36.9 C) 98 F (36.7 C)  TempSrc:  Oral Oral Oral  SpO2: 98% 98%    Weight:    69.4 kg  Height:        Intake/Output Summary (Last 24 hours) at 07/25/2019 0711 Last data filed at 07/25/2019 0449 Gross per 24 hour  Intake 2003.64 ml  Output 900 ml  Net 1103.64 ml   Filed Weights   07/23/19 0530 07/24/19 0634 07/25/19 0444  Weight: 64.2 kg 67.6 kg 69.4 kg    Telemetry    NSR - Personally Reviewed  ECG    No new EKG to review - Personally Reviewed  Physical Exam   GEN: Well nourished, well developed in no acute distress HEENT: Normal NECK: No JVD; No carotid bruits LYMPHATICS: No lymphadenopathy CARDIAC:RRR, no murmurs, rubs, gallops RESPIRATORY:  Clear to auscultation without rales, wheezing or rhonchi  ABDOMEN: Soft, non-tender, non-distended MUSCULOSKELETAL:   No edema; No deformity  SKIN: Warm and dry NEUROLOGIC:  Alert and oriented x 3 PSYCHIATRIC:  Normal affect    Labs    Chemistry Recent Labs  Lab 07/21/19 0951 07/21/19 [id number] 07/22/19 0241 07/22/19 0241 07/23/19 0239 07/24/19 0253 07/25/19 0257  NA 138   < > 134*   < > 135 136 140  K 4.1   < > 3.5   < > 4.1 3.5 3.6  CL 99   < > 100   < > 107 114* 116*  CO2 27   < > 20*   < > 21* 17* 20*  GLUCOSE 101*   < > 100*   < > 138* 112* 110*  BUN 15   < > 16   < > 14 9 9   CREATININE 0.86   < > 0.84   < > 0.75 0.70 0.71  CALCIUM 9.1   < > 8.3*   < > 7.7* 7.4* 7.7*  PROT 7.4  --  6.6  --   --   --   --   ALBUMIN 4.2  --  3.5  --   --   --   --   AST 27  --  32  --   --   --   --   ALT 14  --  13  --   --   --   --   ALKPHOS 83  --  73  --   --   --   --   BILITOT 2.4*  --  1.8*  --   --   --   --   GFRNONAA >60   < > >60   < > >60 >60 >60  GFRAA >60   < > >60   < > >60 >60 >60  ANIONGAP 12   < > 14   < > 7 5 4*   < > = values in this interval not displayed.     Hematology Recent Labs  Lab 07/23/19 0239 07/24/19 0253 07/25/19 0257  WBC 10.8* 7.0 4.9  RBC 2.87* 2.75* 2.69*  HGB 8.7* 8.4* 8.1*  HCT 26.5* 25.1* 24.1*  MCV 92.3 91.3 89.6  MCH 30.3 30.5 30.1  MCHC 32.8 33.5 33.6  RDW 24.7* 25.3* 25.4*  PLT 203 205 237    Cardiac EnzymesNo results for input(s): TROPONINI in the last 168 hours. No results for input(s): TROPIPOC in the last 168 hours.   BNPNo results for input(s): BNP, PROBNP in the last 168 hours.   DDimer No results for input(s): DDIMER in the last 168 hours.   Radiology    ECHOCARDIOGRAM COMPLETE  Result Date: 07/24/2019    ECHOCARDIOGRAM REPORT   Patient Name:   [name] Date of Exam: 07/24/2019 Medical Rec #:  [id number]            Height:       73.0 in Accession #:    [id number]           Weight:       149.0 lb Date of Birth:  1940/09/14             BSA:          1.899 m Patient Age:    79 years             BP:           109/63 mmHg Patient  Gender: M                    HR:           96 bpm. Exam Location:  Inpatient Procedure: 2D Echo, Cardiac Doppler and Color Doppler Indications:    I48.92* Unspecified atrial flutter  History:        Patient has prior history of Echocardiogram examinations, most                 recent 06/03/2011. Abnormal ECG, Arrythmias:Atrial Flutter and                 RBBB; Signs/Symptoms:Altered Mental Status, Alzheimer's and                 Bacteremia.  Sonographer:    Roseanna Rainbow RDCS Referring Phys: Brooke  1. Left ventricular ejection fraction, by estimation, is 60 to 65%. The left ventricle has normal function. The left ventricle has no regional wall motion abnormalities. Left ventricular diastolic function could not be evaluated.  2. Right ventricular systolic function is normal. The right ventricular size is normal.  3. Right atrial size was mildly dilated.  4. Moderate pleural effusion in both left and right lateral regions.  5. The mitral  valve is normal in structure. Trivial mitral valve regurgitation. No evidence of mitral stenosis.  6. The aortic valve is grossly normal. Aortic valve regurgitation is not visualized. No aortic stenosis is present.  7. Aortic dilatation noted. There is mild dilatation of the ascending aorta measuring 39 mm.  8. The inferior vena cava is normal in size with greater than 50% respiratory variability, suggesting right atrial pressure of 3 mmHg. Comparison(s): No significant change from prior study. FINDINGS  Left Ventricle: Left ventricular ejection fraction, by estimation, is 60 to 65%. The left ventricle has normal function. The left ventricle has no regional wall motion abnormalities. The left ventricular internal cavity size was normal in size. There is  no left ventricular hypertrophy. Left ventricular diastolic function could not be evaluated due to atrial fibrillation. Left ventricular diastolic function could not be evaluated. Right Ventricle: The right  ventricular size is normal. No increase in right ventricular wall thickness. Right ventricular systolic function is normal. Left Atrium: Left atrial size was normal in size. Right Atrium: Right atrial size was mildly dilated. Pericardium: A small pericardial effusion is present. Mitral Valve: The mitral valve is normal in structure. Trivial mitral valve regurgitation. No evidence of mitral valve stenosis. Tricuspid Valve: The tricuspid valve is normal in structure. Tricuspid valve regurgitation is trivial. No evidence of tricuspid stenosis. Aortic Valve: The aortic valve is grossly normal. Aortic valve regurgitation is not visualized. No aortic stenosis is present. Pulmonic Valve: The pulmonic valve was not well visualized. Pulmonic valve regurgitation is not visualized. No evidence of pulmonic stenosis. Aorta: Aortic dilatation noted. There is mild dilatation of the ascending aorta measuring 39 mm. Venous: The inferior vena cava is normal in size with greater than 50% respiratory variability, suggesting right atrial pressure of 3 mmHg. IAS/Shunts: No atrial level shunt detected by color flow Doppler. Additional Comments: There is a moderate pleural effusion in both left and right lateral regions.  LEFT VENTRICLE PLAX 2D LVIDd:         3.70 cm LVIDs:         2.50 cm LV PW:         1.20 cm LV IVS:        1.10 cm LVOT diam:     1.70 cm LV SV:         54 LV SV Index:   28 LVOT Area:     2.27 cm  LV Volumes (MOD) LV vol d, MOD A2C: 37.0 ml LV vol d, MOD A4C: 39.9 ml LV vol s, MOD A2C: 12.9 ml LV vol s, MOD A4C: 17.5 ml LV SV MOD A2C:     24.1 ml LV SV MOD A4C:     39.9 ml LV SV MOD BP:      23.2 ml RIGHT VENTRICLE         IVC TAPSE (M-mode): 2.1 cm  IVC diam: 1.50 cm LEFT ATRIUM             Index       RIGHT ATRIUM           Index LA diam:        2.90 cm 1.53 cm/m  RA Area:     18.90 cm LA Vol (A2C):   59.5 ml 31.34 ml/m RA Volume:   58.20 ml  30.65 ml/m LA Vol (A4C):   41.1 ml 21.65 ml/m LA Biplane Vol: 50.7 ml  26.70 ml/m  AORTIC VALVE LVOT Vmax:   156.00 cm/s LVOT Vmean:  95.100  cm/s LVOT VTI:    0.238 m  AORTA Ao Root diam: 3.60 cm Ao Asc diam:  3.90 cm MITRAL VALVE MV Area (PHT): 4.15 cm     SHUNTS MV Decel Time: 183 msec     Systemic VTI:  0.24 m MV E velocity: 111.00 cm/s  Systemic Diam: 1.70 cm Buford Dresser MD Electronically signed by Buford Dresser MD Signature Date/Time: 07/24/2019/7:18:25 PM    Final     Cardiac Studies   2D echo IMPRESSIONS   1. Left ventricular ejection fraction, by estimation, is 60 to 65%. The  left ventricle has normal function. The left ventricle has no regional  wall motion abnormalities. Left ventricular diastolic function could not  be evaluated.  2. Right ventricular systolic function is normal. The right ventricular  size is normal.  3. Right atrial size was mildly dilated.  4. Moderate pleural effusion in both left and right lateral regions.  5. The mitral valve is normal in structure. Trivial mitral valve  regurgitation. No evidence of mitral stenosis.  6. The aortic valve is grossly normal. Aortic valve regurgitation is not  visualized. No aortic stenosis is present.  7. Aortic dilatation noted. There is mild dilatation of the ascending  aorta measuring 39 mm.  8. The inferior vena cava is normal in size with greater than 50%  respiratory variability, suggesting right atrial pressure of 3 mmHg.   Comparison(s): No significant change from prior study.   Patient Profile     79 y.o. male who is being seen  for the evaluation of atrial fibrillation with RVR at the request of Francia Greaves, DO.    Assessment & Plan    1.  Persistent atrial fibrillation/flutter with RVR -he was last in NSR in July 2020. -completely asymptomatic at this time -appears to have converted to NSR >> will confirm with 12 lead EKG -TSH 1.33 -BP too soft for addition of BB or CCB -INRs have been therapeutic since 02/2019 -INR remains elevated today at  4.8, may be due to Amio - coumadin on hold for now -K+ 3.6 today -echo showed normal LVF -continue Amio 200mg  BID for suppression of afib  2.  Sepsis -secondary to PNA, UTI and possible enteritis -now on IV antibx per TRH -abdominal CT showed multifocal diverticulosis without diverticulitis, colonic wall thickening without inflammation and diffuse bladder wall thickening seen with UTIs.  ? Underlying neoplasm in splenic flexure -abdomen is firm and diffusely tender -consider GI consult  3. Elevated trop -minimally elevated with flat trend (18>26) -2D echo showed normal LVF with no RWMAs -suspect this is demand ischemia in the setting of sepsis and tachycardia  4.  HTN -BP stable at 107/33mmHg -he has not required any antihypertensive therapy recently   For questions or updates, please contact [place] Please consult [email or web address] for contact info under Cardiology/STEMI.      Signed, Fransico Him, MD  07/25/2019, 7:11 AM

## 2019-07-25 NOTE — Progress Notes (Signed)
PROGRESS NOTE    [name]  [id number] DOB: 07-07-1940 DOA: 07/21/2019 PCP: [name], MD   Chief Complaint  Patient presents with  . Weakness    Brief Narrative:  79 year old gentleman with prior history of hypertension, seizure disorder, stroke, atrial flutter on Coumadin, dementia, GERD presents to ED with generalized weakness, loss of appetite, loss 20 pounds in the last 3 months.  Patient also reports mild abdominal pain that has resolved now and nonbloody diarrhea for more than 2 to 3 weeks. On arrival patient was febrile tachypneic tachycardic with leukocytosis and chest x-ray showed left lung opacity suspicious for pneumonia.  Patient was started on IV vancomycin and cefepime for concern for sepsis secondary to community-acquired pneumonia. SLP evaluation recommended esophagogram for evaluation of esophageal dysmotility. Cardiology consulted for persistent atrial flutter/atrial fibrillation with RVR.  Assessment & Plan:   Principal Problem:   Sepsis ([place]) Active Problems:   Essential hypertension   Stroke (HCC)   Seizure disorder (HCC)   Paroxysmal atrial flutter (HCC)   Dementia (AD)   CAP (community acquired pneumonia)   Total bilirubin, elevated   Sepsis Appears to have resolved. Probably secondary to left lower lobe pneumonia, enteritis. CT of the abdomen and pelvis shows colon wall thickening at the level of splenic flexure. Patient was initially started on IV vancomycin and cefepime , currently on IV cefepime day 4. Improving procalcitonin, for fever resolved, WBC count normalized and breathing has improved. Blood cultures have been negative so far, urine cultures are also negative.  Patient denies any abdominal pain nausea or vomiting at this time.    Persistent atrial fibrillation/flutter with RVR TSH within normal limits Echocardiogram showed Left ventricular ejection fraction, by estimation, is 60 to 65%. The left ventricle has normal  function. The left ventricle has no regional  wall motion abnormalities. Left ventricular diastolic function could not  be evaluated. Patient received 1 dose of IV amiodarone and transition to oral 200 mg twice daily Currently patient is in sinus. Cardiology consulted and recommendations given.   Hypokalemia and hypomagnesemia   Replaced  Seizure disorder Continue with Keppra.    Colon wall thickening at the level of splenic flexure of the colon GI consulted to see if the patient needs a colonoscopy at this time.    Acute metabolic encephalopathy Probably secondary to left lower lobe pneumonia, sepsis, acute illness Patient is back to baseline alert and oriented at this time.    Non-anion gap metabolic acidosis Improved with hydration.    Elevated troponins probably secondary to demand ischemia in the setting of atrial flutter with RVR No evidence of acute ischemia on the EKG.    Physical debility and deconditioning PT and OT eval recommending SNF  Supratherapeutic INR INR > 4, Hold Coumadin tonight and pharmacy to dose the Coumadin    GERD Stable    History of anemia of chronic disease History of gastric AVMs Baseline hemoglobin around 7. Hemoglobin today is around 8.  Continue to monitor.    DVT prophylaxis: Coumadin  code Status full code Family Communication: None at bedside Status is: Inpatient  Remains inpatient appropriate because:IV treatments appropriate due to intensity of illness or inability to take PO   Dispo: The patient is from: Home              Anticipated d/c is to: SNF              Anticipated d/c date is: 1 day  Patient currently is not medically stable to d/c.        Consultants:   Cardiology   Procedures: Esophagogram  Antimicrobials: cefepime   Subjective: No chest pain or sob, wanted to know if he can go back to his regular diet the rest of his hospital stay.   Objective: Vitals:   07/24/19  2357 07/25/19 0444 07/25/19 0714 07/25/19 1100  BP:   107/62 117/66  Pulse:   91 96  Resp:   20 19  Temp: 98.5 F (36.9 C) 98 F (36.7 C) 99.1 F (37.3 C) 98 F (36.7 C)  TempSrc: Oral Oral Oral Oral  SpO2:   94% 96%  Weight:  69.4 kg    Height:        Intake/Output Summary (Last 24 hours) at 07/25/2019 1200 Last data filed at 07/25/2019 0714 Gross per 24 hour  Intake 2252.87 ml  Output 900 ml  Net 1352.87 ml   Filed Weights   07/23/19 0530 07/24/19 0634 07/25/19 0444  Weight: 64.2 kg 67.6 kg 69.4 kg    Examination:  General exam: Appears calm and comfortable  Respiratory system: Clear to auscultation. Respiratory effort normal. Cardiovascular system: S1 & S2 heard, RRR,  No pedal edema. Gastrointestinal system: Abdomen is nondistended, soft and nontender. . Normal bowel sounds heard. Central nervous system: Alert and oriented. No focal neurological deficits. Extremities: no pedal edema.  Skin: No rashes, lesions or ulcers Psychiatry: Mood & affect appropriate.     Data Reviewed: I have personally reviewed following labs and imaging studies  CBC: Recent Labs  Lab 07/21/19 0951 07/22/19 0241 07/23/19 0239 07/24/19 0253 07/25/19 0257  WBC 16.8* 16.4* 10.8* 7.0 4.9  NEUTROABS 14.1*  --  8.5* 6.0 2.9  HGB 10.6* 10.5* 8.7* 8.4* 8.1*  HCT 33.0* 33.0* 26.5* 25.1* 24.1*  MCV 93.0 94.0 92.3 91.3 89.6  PLT 234 216 203 205 [id number]    Basic Metabolic Panel: Recent Labs  Lab 07/21/19 0951 07/21/19 1742 07/22/19 0241 07/23/19 0239 07/24/19 0253 07/25/19 0257  NA 138  --  134* 135 136 140  K 4.1  --  3.5 4.1 3.5 3.6  CL 99  --  100 107 114* 116*  CO2 27  --  20* 21* 17* 20*  GLUCOSE 101*  --  100* 138* 112* 110*  BUN 15  --  16 14 9 9   CREATININE 0.86  --  0.84 0.75 0.70 0.71  CALCIUM 9.1  --  8.3* 7.7* 7.4* 7.7*  MG  --  1.5*  --  2.0  --   --   PHOS  --  3.1  --   --   --   --     GFR: Estimated Creatinine Clearance: 72.3 mL/min (by C-G formula based on SCr  of 0.71 mg/dL).  Liver Function Tests: Recent Labs  Lab 07/21/19 0951 07/22/19 0241  AST 27 32  ALT 14 13  ALKPHOS 83 73  BILITOT 2.4* 1.8*  PROT 7.4 6.6  ALBUMIN 4.2 3.5    CBG: Recent Labs  Lab 07/21/19 1743  GLUCAP 80     Recent Results (from the past 240 hour(s))  Blood Culture (routine x 2)     Status: None (Preliminary result)   Collection Time: 07/21/19  9:48 AM   Specimen: BLOOD LEFT HAND  Result Value Ref Range Status   Specimen Description BLOOD LEFT HAND  Final   Special Requests   Final    BOTTLES DRAWN AEROBIC AND ANAEROBIC Blood  Culture results may not be optimal due to an inadequate volume of blood received in culture bottles   Culture   Final    NO GROWTH 4 DAYS Performed at [facility], [place] [address], [place], [place] [address]    Report Status PENDING  Incomplete  Blood Culture (routine x 2)     Status: None (Preliminary result)   Collection Time: 07/21/19  9:51 AM   Specimen: BLOOD  Result Value Ref Range Status   Specimen Description BLOOD RIGHT ANTECUBITAL  Final   Special Requests   Final    BOTTLES DRAWN AEROBIC ONLY Blood Culture results may not be optimal due to an inadequate volume of blood received in culture bottles   Culture   Final    NO GROWTH 4 DAYS Performed at [facility], [place] [address] [place], [place] [address]    Report Status PENDING  Incomplete  Respiratory Panel by RT PCR (Flu A&B, Covid) - Nasopharyngeal Swab     Status: None   Collection Time: 07/21/19 12:25 PM   Specimen: Nasopharyngeal Swab  Result Value Ref Range Status   SARS Coronavirus 2 by RT PCR NEGATIVE NEGATIVE Final    Comment: (NOTE) SARS-CoV-2 target nucleic acids are NOT DETECTED. The SARS-CoV-2 RNA is generally detectable in upper respiratoy specimens during the acute phase of infection. The lowest concentration of SARS-CoV-2 viral copies this assay can detect is 131 copies/mL. A negative result does not preclude SARS-Cov-2 infection  and should not be used as the sole basis for treatment or other patient management decisions. A negative result may occur with  improper specimen collection/handling, submission of specimen other than nasopharyngeal swab, presence of viral mutation(s) within the areas targeted by this assay, and inadequate number of viral copies (<131 copies/mL). A negative result must be combined with clinical observations, patient history, and epidemiological information. The expected result is Negative. Fact Sheet for Patients:  PinkCheek.be Fact Sheet for Healthcare Providers:  GravelBags.it This test is not yet ap proved or cleared by the [place] FDA and  has been authorized for detection and/or diagnosis of SARS-CoV-2 by FDA under an Emergency Use Authorization (EUA). This EUA will remain  in effect (meaning this test can be used) for the duration of the COVID-19 declaration under Section 564(b)(1) of the Act, 21 U.S.C. section 360bbb-3(b)(1), unless the authorization is terminated or revoked sooner.    Influenza A by PCR NEGATIVE NEGATIVE Final   Influenza B by PCR NEGATIVE NEGATIVE Final    Comment: (NOTE) The Xpert Xpress SARS-CoV-2/FLU/RSV assay is intended as an aid in  the diagnosis of influenza from Nasopharyngeal swab specimens and  should not be used as a sole basis for treatment. Nasal washings and  aspirates are unacceptable for Xpert Xpress SARS-CoV-2/FLU/RSV  testing. Fact Sheet for Patients: PinkCheek.be Fact Sheet for Healthcare Providers: GravelBags.it This test is not yet approved or cleared by the [place] FDA and  has been authorized for detection and/or diagnosis of SARS-CoV-2 by  FDA under an Emergency Use Authorization (EUA). This EUA will remain  in effect (meaning this test can be used) for the duration of the  Covid-19 declaration under Section  564(b)(1) of the Act, 21  U.S.C. section 360bbb-3(b)(1), unless the authorization is  terminated or revoked. Performed at [facility], [place] [address] [place], [place] [address]   Urine culture     Status: None   Collection Time: 07/21/19  1:13 PM   Specimen: In/Out  Cath Urine  Result Value Ref Range Status   Specimen Description IN/OUT CATH URINE  Final   Special Requests NONE  Final   Culture   Final    NO GROWTH Performed at [facility], [address] [place], [place] [address]    Report Status 07/22/2019 FINAL  Final  MRSA PCR Screening     Status: None   Collection Time: 07/21/19  7:53 PM   Specimen: Nasopharyngeal  Result Value Ref Range Status   MRSA by PCR NEGATIVE NEGATIVE Final    Comment:        The GeneXpert MRSA Assay (FDA approved for NASAL specimens only), is one component of a comprehensive MRSA colonization surveillance program. It is not intended to diagnose MRSA infection nor to guide or monitor treatment for MRSA infections. Performed at [facility], [place] [address], [place], [place] [address]          Radiology Studies: ECHOCARDIOGRAM COMPLETE  Result Date: 07/24/2019    ECHOCARDIOGRAM REPORT   Patient Name:   MORGAN MCLANE Grima Date of Exam: 07/24/2019 Medical Rec #:  [id number]            Height:       73.0 in Accession #:    [id number]           Weight:       149.0 lb Date of Birth:  1940/12/18             BSA:          1.899 m Patient Age:    62 years             BP:           109/63 mmHg Patient Gender: M                    HR:           96 bpm. Exam Location:  Inpatient Procedure: 2D Echo, Cardiac Doppler and Color Doppler Indications:    I48.92* Unspecified atrial flutter  History:        Patient has prior history of Echocardiogram examinations, most                 recent 06/03/2011. Abnormal ECG, Arrythmias:Atrial Flutter and                 RBBB; Signs/Symptoms:Altered Mental Status, Alzheimer's and                  Bacteremia.  Sonographer:    Roseanna Rainbow RDCS Referring Phys: [place]  1. Left ventricular ejection fraction, by estimation, is 60 to 65%. The left ventricle has normal function. The left ventricle has no regional wall motion abnormalities. Left ventricular diastolic function could not be evaluated.  2. Right ventricular systolic function is normal. The right ventricular size is normal.  3. Right atrial size was mildly dilated.  4. Moderate pleural effusion in both left and right lateral regions.  5. The mitral valve is normal in structure. Trivial mitral valve regurgitation. No evidence of mitral stenosis.  6. The aortic valve is grossly normal. Aortic valve regurgitation is not visualized. No aortic stenosis is present.  7. Aortic dilatation noted. There is mild dilatation of the ascending aorta measuring 39 mm.  8. The inferior vena cava is normal in size with greater than 50% respiratory variability, suggesting right atrial pressure of 3 mmHg. Comparison(s): No significant change from  prior study. FINDINGS  Left Ventricle: Left ventricular ejection fraction, by estimation, is 60 to 65%. The left ventricle has normal function. The left ventricle has no regional wall motion abnormalities. The left ventricular internal cavity size was normal in size. There is  no left ventricular hypertrophy. Left ventricular diastolic function could not be evaluated due to atrial fibrillation. Left ventricular diastolic function could not be evaluated. Right Ventricle: The right ventricular size is normal. No increase in right ventricular wall thickness. Right ventricular systolic function is normal. Left Atrium: Left atrial size was normal in size. Right Atrium: Right atrial size was mildly dilated. Pericardium: A small pericardial effusion is present. Mitral Valve: The mitral valve is normal in structure. Trivial mitral valve regurgitation. No evidence of mitral valve stenosis. Tricuspid Valve: The  tricuspid valve is normal in structure. Tricuspid valve regurgitation is trivial. No evidence of tricuspid stenosis. Aortic Valve: The aortic valve is grossly normal. Aortic valve regurgitation is not visualized. No aortic stenosis is present. Pulmonic Valve: The pulmonic valve was not well visualized. Pulmonic valve regurgitation is not visualized. No evidence of pulmonic stenosis. Aorta: Aortic dilatation noted. There is mild dilatation of the ascending aorta measuring 39 mm. Venous: The inferior vena cava is normal in size with greater than 50% respiratory variability, suggesting right atrial pressure of 3 mmHg. IAS/Shunts: No atrial level shunt detected by color flow Doppler. Additional Comments: There is a moderate pleural effusion in both left and right lateral regions.  LEFT VENTRICLE PLAX 2D LVIDd:         3.70 cm LVIDs:         2.50 cm LV PW:         1.20 cm LV IVS:        1.10 cm LVOT diam:     1.70 cm LV SV:         54 LV SV Index:   28 LVOT Area:     2.27 cm  LV Volumes (MOD) LV vol d, MOD A2C: 37.0 ml LV vol d, MOD A4C: 39.9 ml LV vol s, MOD A2C: 12.9 ml LV vol s, MOD A4C: 17.5 ml LV SV MOD A2C:     24.1 ml LV SV MOD A4C:     39.9 ml LV SV MOD BP:      23.2 ml RIGHT VENTRICLE         IVC TAPSE (M-mode): 2.1 cm  IVC diam: 1.50 cm LEFT ATRIUM             Index       RIGHT ATRIUM           Index LA diam:        2.90 cm 1.53 cm/m  RA Area:     18.90 cm LA Vol (A2C):   59.5 ml 31.34 ml/m RA Volume:   58.20 ml  30.65 ml/m LA Vol (A4C):   41.1 ml 21.65 ml/m LA Biplane Vol: 50.7 ml 26.70 ml/m  AORTIC VALVE LVOT Vmax:   156.00 cm/s LVOT Vmean:  95.100 cm/s LVOT VTI:    0.238 m  AORTA Ao Root diam: 3.60 cm Ao Asc diam:  3.90 cm MITRAL VALVE MV Area (PHT): 4.15 cm     SHUNTS MV Decel Time: 183 msec     Systemic VTI:  0.24 m MV E velocity: 111.00 cm/s  Systemic Diam: 1.70 cm Buford Dresser MD Electronically signed by Buford Dresser MD Signature Date/Time: 07/24/2019/7:18:25 PM    Final    DG  ESOPHAGUS W SINGLE CM (SOL OR THIN BA)  Result Date: 07/25/2019 CLINICAL DATA:  Throat clearing, globus sensation EXAM: ESOPHOGRAM/BARIUM SWALLOW TECHNIQUE: Single contrast examination was performed using barium. FLUOROSCOPY TIME:  Fluoroscopy Time:  54 seconds COMPARISON:  None. FINDINGS: Patient swallowed barium without difficulty. There is no mass, stricture, or other abnormality identified. Normal motility. No hiatal hernia. There is a subcentimeter epiphrenic diverticulum. No evidence of spontaneous gastroesophageal reflux. IMPRESSION: Subcentimeter epiphrenic diverticulum. Otherwise unremarkable study. Electronically Signed   By: Macy Mis [name]   On: 07/25/2019 10:28        Scheduled Meds: . amiodarone  200 mg Oral BID  . feeding supplement (ENSURE ENLIVE)  237 mL Oral TID BM  . ferrous sulfate  325 mg Oral Q breakfast  . folic acid  1 mg Oral Daily  . levETIRAcetam  1,500 mg Oral BID  . multivitamin with minerals  1 tablet Oral Daily  . pantoprazole  40 mg Oral Daily  . phenytoin  200 mg Oral Daily  . predniSONE  1 mg Oral Q breakfast  . Warfarin - Pharmacist Dosing Inpatient   Does not apply q1600   Continuous Infusions: . sodium chloride 100 mL/hr at 07/25/19 0816  . ceFEPime (MAXIPIME) IV 2 g (07/25/19 0924)  . sodium chloride       LOS: 4 days        Hosie Poisson, MD [facility]   To contact the attending provider between 7A-7P or the covering provider during after hours 7P-7A, please log into the web site [email or web address] and access using universal [place] password for that web site. If you do not have the password, please call the hospital operator.  07/25/2019, 12:00 PM

## 2019-07-25 NOTE — Progress Notes (Signed)
[name] for warfarin Indication: atrial fibrillation,   No Known Allergies  Patient Measurements: Height: 6\' 1"  (185.4 cm) Weight: 69.4 kg (153 lb) IBW/kg (Calculated) : 79.9\  Vital Signs: Temp: 98 F (36.7 C) (05/05 1100) Temp Source: Oral (05/05 1100) BP: 117/66 (05/05 1100) Pulse Rate: 96 (05/05 1100)  Labs: Recent Labs    07/23/19 0239 07/23/19 0239 07/24/19 0253 07/25/19 0257  HGB 8.7*   < > 8.4* 8.1*  HCT 26.5*  --  25.1* 24.1*  PLT 203  --  205 237  LABPROT 33.2*  --  41.0* 43.7*  INR 3.4*  --  4.4* 4.8*  CREATININE 0.75  --  0.70 0.71   < > = values in this interval not displayed.    Estimated Creatinine Clearance: 72.3 mL/min (by C-G formula based on SCr of 0.71 mg/dL).   Medical History: Past Medical History:  Diagnosis Date  . Hiatal hernia   . HTN (hypertension)    pt denies h/o HTN though it appears he was started on spironolactone during his hospitalization 3/13  . Pancreatitis   . Seizures ([place])   . Stroke ([place])   . Typical atrial flutter ([place]) 3/13    Medications:  Medications Prior to Admission  Medication Sig Dispense Refill Last Dose  . ferrous sulfate 325 (65 FE) MG tablet Take 1 tablet (325 mg total) by mouth 3 (three) times daily with meals. (Patient taking differently: Take 325 mg by mouth daily with breakfast. ) 60 tablet 0 07/20/2019 at Unknown time  . levETIRAcetam (KEPPRA) 500 MG tablet TAKE 3 TABLETS BY MOUTH TWICE A DAY (Patient taking differently: Take 1,500 mg by mouth 2 (two) times daily. ) 540 tablet 3 07/20/2019 at Unknown time  . phenytoin (DILANTIN) 200 MG ER capsule Take 1 capsule (200 mg total) by mouth at bedtime. (Patient taking differently: Take 200 mg by mouth daily. ) 30 capsule 1 07/20/2019 at Unknown time  . predniSONE (DELTASONE) 5 MG tablet Take 1 mg by mouth daily.    07/20/2019 at Unknown time  . warfarin (COUMADIN) 2.5 MG tablet TAKE AS DIRECTED BY COUMADIN CLINIC. (Patient taking  differently: Take 2.5 mg by mouth See admin instructions. TAKE AS DIRECTED BY COUMADIN CLINIC.) 180 tablet 1 07/20/2019 at Unknown time  . folic acid (FOLVITE) 1 MG tablet Take 1 tablet (1 mg total) by mouth daily. (Patient not taking: Reported on 07/21/2019) 90 tablet 1 Not Taking at Unknown time  . pantoprazole (PROTONIX) 40 MG tablet Take 1 tablet (40 mg total) by mouth daily for 30 days. 30 tablet 0   . polyethylene glycol (MIRALAX / GLYCOLAX) 17 g packet Take 17 g by mouth daily as needed for mild constipation. (Patient not taking: Reported on 07/21/2019) 14 each 0 Not Taking at Unknown time    Assessment: 69 YOM admitted with generalized weakness on warfarin at home for Afib. INR therapeutic on admission at 2.2 -INR up to 4.8 (likelyin setting of amiodarone and antibiotics) -hg= 8.4  Home warfarin regimen: 2.5 mg on Mon & Fri; 5 mg on all other days    Goal of Therapy:  INR 2-3 Monitor platelets by anticoagulation protocol: Yes   Plan:  -Hold warfarin today -Monitor daily INR     Hildred Laser, PharmD Clinical Pharmacist **Pharmacist phone directory can now be found on [email or web address] ([email or web address]).  Listed under [place].

## 2019-07-26 DIAGNOSIS — I4510 Unspecified right bundle-branch block: Secondary | ICD-10-CM | POA: Diagnosis not present

## 2019-07-26 DIAGNOSIS — I10 Essential (primary) hypertension: Secondary | ICD-10-CM | POA: Diagnosis not present

## 2019-07-26 DIAGNOSIS — I4892 Unspecified atrial flutter: Secondary | ICD-10-CM | POA: Diagnosis not present

## 2019-07-26 LAB — CULTURE, BLOOD (ROUTINE X 2)
Culture: NO GROWTH
Culture: NO GROWTH

## 2019-07-26 LAB — CBC WITH DIFFERENTIAL/PLATELET
Abs Immature Granulocytes: 0.27 10*3/uL — ABNORMAL HIGH (ref 0.00–0.07)
Basophils Absolute: 0.1 10*3/uL (ref 0.0–0.1)
Basophils Relative: 2 %
Eosinophils Absolute: 0.3 10*3/uL (ref 0.0–0.5)
Eosinophils Relative: 5 %
HCT: 23.4 % — ABNORMAL LOW (ref 39.0–52.0)
Hemoglobin: 8 g/dL — ABNORMAL LOW (ref 13.0–17.0)
Immature Granulocytes: 5 %
Lymphocytes Relative: 15 %
Lymphs Abs: 0.8 10*3/uL (ref 0.7–4.0)
MCH: 30.3 pg (ref 26.0–34.0)
MCHC: 34.2 g/dL (ref 30.0–36.0)
MCV: 88.6 fL (ref 80.0–100.0)
Monocytes Absolute: 1 10*3/uL (ref 0.1–1.0)
Monocytes Relative: 19 %
Neutro Abs: 2.9 10*3/uL (ref 1.7–7.7)
Neutrophils Relative %: 54 %
Platelets: 249 10*3/uL (ref 150–400)
RBC: 2.64 MIL/uL — ABNORMAL LOW (ref 4.22–5.81)
RDW: 24.7 % — ABNORMAL HIGH (ref 11.5–15.5)
WBC: 5.3 10*3/uL (ref 4.0–10.5)
nRBC: 0.4 % — ABNORMAL HIGH (ref 0.0–0.2)

## 2019-07-26 LAB — PROTIME-INR
INR: 3 — ABNORMAL HIGH (ref 0.8–1.2)
Prothrombin Time: 30.5 seconds — ABNORMAL HIGH (ref 11.4–15.2)

## 2019-07-26 MED ORDER — ENSURE ENLIVE PO LIQD: 237.0000 mL | Freq: Three times a day (TID) | ORAL | 12 refills | Status: AC

## 2019-07-26 MED ORDER — AMIODARONE HCL 200 MG PO TABS: 200.0000 mg | ORAL_TABLET | Freq: Two times a day (BID) | ORAL | 1 refills | Status: DC

## 2019-07-26 MED ORDER — ADULT MULTIVITAMIN W/MINERALS CH: 1.0000 | ORAL_TABLET | Freq: Every day | ORAL | Status: AC

## 2019-07-26 MED ORDER — WARFARIN SODIUM 2.5 MG PO TABS
2.5000 mg | ORAL_TABLET | Freq: Once | ORAL | Status: AC
  Administered 2019-07-26: 2.5 mg via ORAL
  Filled 2019-07-26: qty 1

## 2019-07-26 MED ORDER — PREDNISONE 1 MG PO TABS: 1.0000 mg | ORAL_TABLET | Freq: Every day | ORAL | Status: DC

## 2019-07-26 MED ORDER — WARFARIN SODIUM 2.5 MG PO TABS: 2.5000 mg | ORAL_TABLET | ORAL | Status: DC

## 2019-07-26 NOTE — Progress Notes (Signed)
Inpatient Rehabilitation-Admissions Coordinator   I do not have a bed available for this patient today. Will continue to follow him for possible admit while in house. TOC and MD aware that we cannot accept him to CIR today.   Raechel Ache, OTR/L  Rehab Admissions Coordinator  [phone] 07/26/2019 2:29 PM

## 2019-07-26 NOTE — Progress Notes (Addendum)
Max for warfarin Indication: atrial fibrillation,   No Known Allergies  Patient Measurements: Height: 6\' 1"  (185.4 cm) Weight: 68.8 kg (151 lb 10.8 oz) IBW/kg (Calculated) : 79.9\  Vital Signs: Temp: 98.3 F (36.8 C) (05/06 0800) Temp Source: Oral (05/06 0800) BP: 116/65 (05/06 0500) Pulse Rate: Brent (05/06 0500)  Labs: Recent Labs    07/24/19 0253 07/24/19 0253 07/25/19 0257 07/26/19 0309  HGB 8.4*   < > 8.1* 8.0*  HCT 25.1*  --  24.1* 23.4*  PLT 205  --  237 249  LABPROT 41.0*  --  43.7* 30.5*  INR 4.4*  --  4.8* 3.0*  CREATININE 0.70  --  0.71  --    < > = values in this interval not displayed.    Estimated Creatinine Clearance: 71.7 mL/min (by C-G formula based on SCr of 0.71 mg/dL).   Medical History: Past Medical History:  Diagnosis Date  . Hiatal hernia   . HTN (hypertension)    pt denies h/o HTN though it appears he was started on spironolactone during his hospitalization 3/13  . Pancreatitis   . Seizures ([place])   . Stroke ([place])   . Typical atrial flutter ([place]) 3/13    Medications:  Medications Prior to Admission  Medication Sig Dispense Refill Last Dose  . ferrous sulfate 325 (65 FE) MG tablet Take 1 tablet (325 mg total) by mouth 3 (three) times daily with meals. (Patient taking differently: Take 325 mg by mouth daily with breakfast. ) 60 tablet 0 07/20/2019 at Unknown time  . levETIRAcetam (KEPPRA) 500 MG tablet TAKE 3 TABLETS BY MOUTH TWICE A DAY (Patient taking differently: Take 1,500 mg by mouth 2 (two) times daily. ) 540 tablet 3 07/20/2019 at Unknown time  . phenytoin (DILANTIN) 200 MG ER capsule Take 1 capsule (200 mg total) by mouth at bedtime. (Patient taking differently: Take 200 mg by mouth daily. ) 30 capsule 1 07/20/2019 at Unknown time  . [DISCONTINUED] predniSONE (DELTASONE) 5 MG tablet Take 1 mg by mouth daily.    07/20/2019 at Unknown time  . [DISCONTINUED] warfarin (COUMADIN) 2.5 MG tablet TAKE AS DIRECTED BY  COUMADIN CLINIC. (Patient taking differently: Take 2.5 mg by mouth See admin instructions. TAKE AS DIRECTED BY COUMADIN CLINIC.) 180 tablet 1 07/20/2019 at Unknown time  . folic acid (FOLVITE) 1 MG tablet Take 1 tablet (1 mg total) by mouth daily. (Patient not taking: Reported on 07/21/2019) 90 tablet 1 Not Taking at Unknown time  . pantoprazole (PROTONIX) 40 MG tablet Take 1 tablet (40 mg total) by mouth daily for 30 days. 30 tablet 0   . polyethylene glycol (MIRALAX / GLYCOLAX) 17 g packet Take 17 g by mouth daily as needed for mild constipation. (Patient not taking: Reported on 07/21/2019) 14 each 0 Not Taking at Unknown time    Assessment: [name] admitted with generalized weakness on warfarin at home for Afib. INR therapeutic on admission at 2.2.  -INR 3.0 (recent elevation likely in the setting of amiodarone and antibiotics), NSR -hg= 8.4  Home warfarin regimen: 2.5 mg on Mon & Fri; 5 mg on all other days    Goal of Therapy:  INR 2-3 Monitor platelets by anticoagulation protocol: Yes   Plan:  -Warfarin 2.5mg  x1 -Monitor daily INR -At discharge would consider 2.5mg /day with 5mg  on Fridays and close INR follow-up next week     Hildred Laser, PharmD Clinical Pharmacist **Pharmacist phone directory can now be found on  [email or web address] ([email or web address]).  Listed under [place].

## 2019-07-26 NOTE — Progress Notes (Signed)
Progress Note  Patient Name: [name] Date of Encounter: 07/26/2019  Primary Cardiologist: No primary care provider on file.   Subjective   Denies any chest pain or SOB.  HR well controlled and maintaining NSR  Inpatient Medications    Scheduled Meds: . amiodarone  200 mg Oral BID  . feeding supplement (ENSURE ENLIVE)  237 mL Oral TID BM  . ferrous sulfate  325 mg Oral Q breakfast  . folic acid  1 mg Oral Daily  . levETIRAcetam  1,500 mg Oral BID  . multivitamin with minerals  1 tablet Oral Daily  . pantoprazole  40 mg Oral Daily  . phenytoin  200 mg Oral Daily  . predniSONE  1 mg Oral Q breakfast  . Warfarin - Pharmacist Dosing Inpatient   Does not apply q1600   Continuous Infusions: . sodium chloride 50 mL/hr at 07/26/19 0400  . ceFEPime (MAXIPIME) IV Stopped (07/26/19 0303)  . sodium chloride     PRN Meds: acetaminophen **OR** acetaminophen, ondansetron **OR** ondansetron (ZOFRAN) IV   Vital Signs    Vitals:   07/26/19 0253 07/26/19 0300 07/26/19 0439 07/26/19 0500  BP:  108/61 117/68 116/65  Pulse:  79 84 81  Resp:  (!) 22 19 18   Temp: 98.1 F (36.7 C)     TempSrc: Oral     SpO2:  97% 100% 96%  Weight: 68.8 kg     Height:        Intake/Output Summary (Last 24 hours) at 07/26/2019 [id number] Last data filed at 07/26/2019 0400 Gross per 24 hour  Intake 2099.74 ml  Output 825 ml  Net 1274.74 ml   Filed Weights   07/24/19 0634 07/25/19 0444 07/26/19 0253  Weight: 67.6 kg 69.4 kg 68.8 kg    Telemetry    NSR- Personally Reviewed  ECG    NSR with RBBB - Personally Reviewed  Physical Exam   GEN: Well nourished, well developed in no acute distress HEENT: Normal NECK: No JVD; No carotid bruits LYMPHATICS: No lymphadenopathy CARDIAC:RRR, no murmurs, rubs, gallops RESPIRATORY:  Clear to auscultation without rales, wheezing or rhonchi  ABDOMEN: Soft, non-tender, non-distended MUSCULOSKELETAL:  No edema; No deformity  SKIN: Warm and  dry NEUROLOGIC:  Alert and oriented x 3 PSYCHIATRIC:  Normal affect    Labs    Chemistry Recent Labs  Lab 07/21/19 0951 07/21/19 [id number] 07/22/19 0241 07/22/19 0241 07/23/19 0239 07/24/19 0253 07/25/19 0257  NA 138   < > 134*   < > 135 136 140  K 4.1   < > 3.5   < > 4.1 3.5 3.6  CL 99   < > 100   < > 107 114* 116*  CO2 27   < > 20*   < > 21* 17* 20*  GLUCOSE 101*   < > 100*   < > 138* 112* 110*  BUN 15   < > 16   < > 14 9 9   CREATININE 0.86   < > 0.84   < > 0.75 0.70 0.71  CALCIUM 9.1   < > 8.3*   < > 7.7* 7.4* 7.7*  PROT 7.4  --  6.6  --   --   --   --   ALBUMIN 4.2  --  3.5  --   --   --   --   AST 27  --  32  --   --   --   --   ALT 14  --  13  --   --   --   --   ALKPHOS 83  --  73  --   --   --   --   BILITOT 2.4*  --  1.8*  --   --   --   --   GFRNONAA >60   < > >60   < > >60 >60 >60  GFRAA >60   < > >60   < > >60 >60 >60  ANIONGAP 12   < > 14   < > 7 5 4*   < > = values in this interval not displayed.     Hematology Recent Labs  Lab 07/24/19 0253 07/25/19 0257 07/26/19 0309  WBC 7.0 4.9 5.3  RBC 2.75* 2.69* 2.64*  HGB 8.4* 8.1* 8.0*  HCT 25.1* 24.1* 23.4*  MCV 91.3 89.6 88.6  MCH 30.5 30.1 30.3  MCHC 33.5 33.6 34.2  RDW 25.3* 25.4* 24.7*  PLT 205 237 249    Cardiac EnzymesNo results for input(s): TROPONINI in the last 168 hours. No results for input(s): TROPIPOC in the last 168 hours.   BNPNo results for input(s): BNP, PROBNP in the last 168 hours.   DDimer No results for input(s): DDIMER in the last 168 hours.   Radiology    DG CHEST PORT 1 VIEW  Result Date: 07/25/2019 CLINICAL DATA:  Pleural effusion EXAM: PORTABLE CHEST 1 VIEW COMPARISON:  Jul 21, 2019. FINDINGS: There are bilateral pleural effusions, increased on the left and new on the right. There is atelectatic change in each lung base. Heart size and pulmonary vascularity are normal. No adenopathy. Bones are osteoporotic. IMPRESSION: Pleural effusions bilaterally, increased on the left and  new on the right compared to most recent study. No appreciable airspace consolidation. Stable cardiac silhouette. No adenopathy evident. Electronically Signed   By: Lowella Grip III [name]   On: 07/25/2019 13:53   ECHOCARDIOGRAM COMPLETE  Result Date: 07/24/2019    ECHOCARDIOGRAM REPORT   Patient Name:   [name] Date of Exam: 07/24/2019 Medical Rec #:  [id number]            Height:       73.0 in Accession #:    [id number]           Weight:       149.0 lb Date of Birth:  1940/09/01             BSA:          1.899 m Patient Age:    79 years             BP:           109/63 mmHg Patient Gender: M                    HR:           96 bpm. Exam Location:  Inpatient Procedure: 2D Echo, Cardiac Doppler and Color Doppler Indications:    I48.92* Unspecified atrial flutter  History:        Patient has prior history of Echocardiogram examinations, most                 recent 06/03/2011. Abnormal ECG, Arrythmias:Atrial Flutter and                 RBBB; Signs/Symptoms:Altered Mental Status, Alzheimer's and                 Bacteremia.  Sonographer:  Roseanna Rainbow RDCS Referring Phys: [place]  1. Left ventricular ejection fraction, by estimation, is 60 to 65%. The left ventricle has normal function. The left ventricle has no regional wall motion abnormalities. Left ventricular diastolic function could not be evaluated.  2. Right ventricular systolic function is normal. The right ventricular size is normal.  3. Right atrial size was mildly dilated.  4. Moderate pleural effusion in both left and right lateral regions.  5. The mitral valve is normal in structure. Trivial mitral valve regurgitation. No evidence of mitral stenosis.  6. The aortic valve is grossly normal. Aortic valve regurgitation is not visualized. No aortic stenosis is present.  7. Aortic dilatation noted. There is mild dilatation of the ascending aorta measuring 39 mm.  8. The inferior vena cava is normal in size with greater than  50% respiratory variability, suggesting right atrial pressure of 3 mmHg. Comparison(s): No significant change from prior study. FINDINGS  Left Ventricle: Left ventricular ejection fraction, by estimation, is 60 to 65%. The left ventricle has normal function. The left ventricle has no regional wall motion abnormalities. The left ventricular internal cavity size was normal in size. There is  no left ventricular hypertrophy. Left ventricular diastolic function could not be evaluated due to atrial fibrillation. Left ventricular diastolic function could not be evaluated. Right Ventricle: The right ventricular size is normal. No increase in right ventricular wall thickness. Right ventricular systolic function is normal. Left Atrium: Left atrial size was normal in size. Right Atrium: Right atrial size was mildly dilated. Pericardium: A small pericardial effusion is present. Mitral Valve: The mitral valve is normal in structure. Trivial mitral valve regurgitation. No evidence of mitral valve stenosis. Tricuspid Valve: The tricuspid valve is normal in structure. Tricuspid valve regurgitation is trivial. No evidence of tricuspid stenosis. Aortic Valve: The aortic valve is grossly normal. Aortic valve regurgitation is not visualized. No aortic stenosis is present. Pulmonic Valve: The pulmonic valve was not well visualized. Pulmonic valve regurgitation is not visualized. No evidence of pulmonic stenosis. Aorta: Aortic dilatation noted. There is mild dilatation of the ascending aorta measuring 39 mm. Venous: The inferior vena cava is normal in size with greater than 50% respiratory variability, suggesting right atrial pressure of 3 mmHg. IAS/Shunts: No atrial level shunt detected by color flow Doppler. Additional Comments: There is a moderate pleural effusion in both left and right lateral regions.  LEFT VENTRICLE PLAX 2D LVIDd:         3.70 cm LVIDs:         2.50 cm LV PW:         1.20 cm LV IVS:        1.10 cm LVOT diam:      1.70 cm LV SV:         54 LV SV Index:   28 LVOT Area:     2.27 cm  LV Volumes (MOD) LV vol d, MOD A2C: 37.0 ml LV vol d, MOD A4C: 39.9 ml LV vol s, MOD A2C: 12.9 ml LV vol s, MOD A4C: 17.5 ml LV SV MOD A2C:     24.1 ml LV SV MOD A4C:     39.9 ml LV SV MOD BP:      23.2 ml RIGHT VENTRICLE         IVC TAPSE (M-mode): 2.1 cm  IVC diam: 1.50 cm LEFT ATRIUM             Index  RIGHT ATRIUM           Index LA diam:        2.90 cm 1.53 cm/m  RA Area:     18.90 cm LA Vol (A2C):   59.5 ml 31.34 ml/m RA Volume:   58.20 ml  30.65 ml/m LA Vol (A4C):   41.1 ml 21.65 ml/m LA Biplane Vol: 50.7 ml 26.70 ml/m  AORTIC VALVE LVOT Vmax:   156.00 cm/s LVOT Vmean:  95.100 cm/s LVOT VTI:    0.238 m  AORTA Ao Root diam: 3.60 cm Ao Asc diam:  3.90 cm MITRAL VALVE MV Area (PHT): 4.15 cm     SHUNTS MV Decel Time: 183 msec     Systemic VTI:  0.24 m MV E velocity: 111.00 cm/s  Systemic Diam: 1.70 cm Buford Dresser MD Electronically signed by Buford Dresser MD Signature Date/Time: 07/24/2019/7:18:25 PM    Final    DG ESOPHAGUS W SINGLE CM (SOL OR THIN BA)  Result Date: 07/25/2019 CLINICAL DATA:  Throat clearing, globus sensation EXAM: ESOPHOGRAM/BARIUM SWALLOW TECHNIQUE: Single contrast examination was performed using barium. FLUOROSCOPY TIME:  Fluoroscopy Time:  54 seconds COMPARISON:  None. FINDINGS: Patient swallowed barium without difficulty. There is no mass, stricture, or other abnormality identified. Normal motility. No hiatal hernia. There is a subcentimeter epiphrenic diverticulum. No evidence of spontaneous gastroesophageal reflux. IMPRESSION: Subcentimeter epiphrenic diverticulum. Otherwise unremarkable study. Electronically Signed   By: Macy Mis [name]   On: 07/25/2019 10:28    Cardiac Studies   2D echo IMPRESSIONS   1. Left ventricular ejection fraction, by estimation, is 60 to 65%. The  left ventricle has normal function. The left ventricle has no regional  wall motion abnormalities. Left  ventricular diastolic function could not  be evaluated.  2. Right ventricular systolic function is normal. The right ventricular  size is normal.  3. Right atrial size was mildly dilated.  4. Moderate pleural effusion in both left and right lateral regions.  5. The mitral valve is normal in structure. Trivial mitral valve  regurgitation. No evidence of mitral stenosis.  6. The aortic valve is grossly normal. Aortic valve regurgitation is not  visualized. No aortic stenosis is present.  7. Aortic dilatation noted. There is mild dilatation of the ascending  aorta measuring 39 mm.  8. The inferior vena cava is normal in size with greater than 50%  respiratory variability, suggesting right atrial pressure of 3 mmHg.   Comparison(s): No significant change from prior study.   Patient Profile     79 y.o. male who is being seen  for the evaluation of atrial fibrillation with RVR at the request of Francia Greaves, DO.    Assessment & Plan    1.  Persistent atrial fibrillation/flutter with RVR -he was last in NSR in July 2020. -completely asymptomatic at this time -appears to have converted to NSR >> confirmed on EKG -TSH 1.33, LFTs normal -BP has been too soft for addition of BB or CCB -INRs have been therapeutic since 02/2019 -INR now therapeutic at 3 >> per pharmacy -K+ 3.6 today -echo showed normal LVF -continue Amio 200mg  BID for suppression of afib -followup outpt in afib clinic early next week  2.  Sepsis -secondary to PNA, UTI and possible enteritis -now on IV antibx per TR[facility] -abdominal CT showed multifocal diverticulosis without diverticulitis, colonic wall thickening without inflammation and diffuse bladder wall thickening seen with UTIs.  ? Underlying neoplasm in splenic flexure -appreciate GI input - ok to  continue anticoagulation per GI  3. Elevated trop -minimally elevated with flat trend (18>26) -2D echo showed normal LVF with no RWMAs -suspect this is demand  ischemia in the setting of sepsis and tachycardia -no further workup at this time  4.  HTN -BP stable at 116/59mmHg -he has not required any antihypertensive therapy recently  [place] will sign off.   Medication Recommendations:  Amiodarone 200mg  BID, Coumadin per pharmacy,  Other recommendations (labs, testing, etc):  None Follow up as an outpatient: afib clinic in 1 week   For questions or updates, please contact [facility] Please consult [email or web address] for contact info under Cardiology/STEMI.      Signed, Fransico Him, MD  07/26/2019, 7:12 AM

## 2019-07-27 DIAGNOSIS — F0280 Dementia in other diseases classified elsewhere without behavioral disturbance: Secondary | ICD-10-CM

## 2019-07-27 DIAGNOSIS — G309 Alzheimer's disease, unspecified: Secondary | ICD-10-CM

## 2019-07-27 LAB — PROTIME-INR
INR: 2 — ABNORMAL HIGH (ref 0.8–1.2)
Prothrombin Time: 22 seconds — ABNORMAL HIGH (ref 11.4–15.2)

## 2019-07-27 MED ORDER — WARFARIN SODIUM 5 MG PO TABS: 5.0000 mg | ORAL_TABLET | Freq: Once | ORAL | Status: DC

## 2019-07-27 NOTE — Plan of Care (Addendum)
  Problem: Health Behavior/Discharge Planning: Goal: Ability to manage health-related needs will improve Outcome: Adequate for Discharge   Problem: Clinical Measurements: Goal: Diagnostic test results will improve Outcome: Adequate for Discharge   Problem: Nutrition: Goal: Adequate nutrition will be maintained Outcome: Adequate for Discharge   Problem: Safety: Goal: Ability to remain free from injury will improve Outcome: Adequate for Discharge   Problem: Education: Goal: Knowledge of General Education information will improve Description: Including pain rating scale, medication(s)/side effects and non-pharmacologic comfort measures Outcome: Adequate for Discharge   Problem: Clinical Measurements: Goal: Ability to maintain clinical measurements within normal limits will improve Outcome: Adequate for Discharge Goal: Will remain free from infection Outcome: Adequate for Discharge Goal: Respiratory complications will improve Outcome: Adequate for Discharge Goal: Cardiovascular complication will be avoided Outcome: Adequate for Discharge   Problem: Activity: Goal: Risk for activity intolerance will decrease Outcome: Adequate for Discharge   Problem: Coping: Goal: Level of anxiety will decrease Outcome: Adequate for Discharge   Problem: Elimination: Goal: Will not experience complications related to bowel motility Outcome: Adequate for Discharge Goal: Will not experience complications related to urinary retention Outcome: Adequate for Discharge   Problem: Pain Managment: Goal: General experience of comfort will improve Outcome: Adequate for Discharge   Problem: Skin Integrity: Goal: Risk for impaired skin integrity will decrease Outcome: Adequate for Discharge    Discharge instructions given to patient. This RN called [name], daughter and gave thorough DC AVS summary information and answered questions. Educated on new medication regimen, signs/symptoms, restrictions,  and follow up appointments. Prescriptions sent to [facility]. PIV DC, hemostasis achieved. Vital signs stable. All belongings sent home with patient.   Pt escorted by NT via wheelchair to private vehicle driven by daughter.   1308: Delayed d/t awaiting DME rolling walker.

## 2019-07-27 NOTE — Consult Note (Addendum)
   [name] Inpatient Consult   07/27/2019  Clancey [name] May 15, 1940 [id number] [name] ACO Patient:  Medicare NextGen  Patient screened for high risk score for unplanned readmission in the [place] and assess for [place] Management service needs.  Review of patient's medical record reveals patient is for home with home health however was for CIR vs SNF.  Review of inpatient Transition of Care team notes daughter is able to provider 24 hour care.  Primary Care Provider is  [name], MD   Plan:  Follow up assign to post hospital care management needs.   1500:  Patient transitioned home, call placed to daughter, [name], and explained follow up services and 24 hour nurse line as additional support. HIPAA verified.   Please place a [facility] Care Management consult as appropriate and for questions contact:   Natividad Brood, RN BSN [facility] Liaison  [phone] business mobile phone Toll free office [phone]  Fax number: [phone] [email or web address]

## 2019-07-27 NOTE — Addendum Note (Signed)
Addended by: Oswaldo Done on: [id number] 03:06 PM   Modules accepted: Level of Service, SmartSet

## 2019-07-27 NOTE — Progress Notes (Addendum)
[place] for warfarin Indication: atrial fibrillation,   No Known Allergies  Patient Measurements: Height: 6\' 1"  (185.4 cm) Weight: 66.8 kg (147 lb 4.3 oz) IBW/kg (Calculated) : 79.9\  Vital Signs: Temp: 98 F (36.7 C) (05/07 0402) Temp Source: Oral (05/07 0402) BP: 125/64 (05/07 0402) Pulse Rate: 82 (05/07 0402)  Labs: Recent Labs    07/25/19 0257 07/26/19 0309 07/27/19 0247  HGB 8.1* 8.0*  --   HCT 24.1* 23.4*  --   PLT 237 249  --   LABPROT 43.7* 30.5* 22.0*  INR 4.8* 3.0* 2.0*  CREATININE 0.71  --   --     Estimated Creatinine Clearance: 69.6 mL/min (by C-G formula based on SCr of 0.71 mg/dL).   Medical History: Past Medical History:  Diagnosis Date  . Hiatal hernia   . HTN (hypertension)    pt denies h/o HTN though it appears he was started on spironolactone during his hospitalization 3/13  . Pancreatitis   . Seizures ([place])   . Stroke ([place])   . Typical atrial flutter ([place]) 3/13    Medications:  Medications Prior to Admission  Medication Sig Dispense Refill Last Dose  . ferrous sulfate 325 (65 FE) MG tablet Take 1 tablet (325 mg total) by mouth 3 (three) times daily with meals. (Patient taking differently: Take 325 mg by mouth daily with breakfast. ) 60 tablet 0 07/20/2019 at Unknown time  . levETIRAcetam (KEPPRA) 500 MG tablet TAKE 3 TABLETS BY MOUTH TWICE A DAY (Patient taking differently: Take 1,500 mg by mouth 2 (two) times daily. ) 540 tablet 3 07/20/2019 at Unknown time  . phenytoin (DILANTIN) 200 MG ER capsule Take 1 capsule (200 mg total) by mouth at bedtime. (Patient taking differently: Take 200 mg by mouth daily. ) 30 capsule 1 07/20/2019 at Unknown time  . [DISCONTINUED] predniSONE (DELTASONE) 5 MG tablet Take 1 mg by mouth daily.    07/20/2019 at Unknown time  . [DISCONTINUED] warfarin (COUMADIN) 2.5 MG tablet TAKE AS DIRECTED BY COUMADIN CLINIC. (Patient taking differently: Take 2.5 mg by mouth See admin instructions. TAKE AS  DIRECTED BY COUMADIN CLINIC.) 180 tablet 1 07/20/2019 at Unknown time  . folic acid (FOLVITE) 1 MG tablet Take 1 tablet (1 mg total) by mouth daily. (Patient not taking: Reported on 07/21/2019) 90 tablet 1 Not Taking at Unknown time  . pantoprazole (PROTONIX) 40 MG tablet Take 1 tablet (40 mg total) by mouth daily for 30 days. 30 tablet 0   . polyethylene glycol (MIRALAX / GLYCOLAX) 17 g packet Take 17 g by mouth daily as needed for mild constipation. (Patient not taking: Reported on 07/21/2019) 14 each 0 Not Taking at Unknown time    Assessment: 30 YOM admitted with generalized weakness on warfarin at home for Afib. INR therapeutic on admission at 2.2.   INR trending down quickly to 2.0 today (recent elevation likely in the setting of amiodarone and antibiotics). Given trend will increase dose today.   Home warfarin regimen: 2.5 mg on Mon & Fri; 5 mg on all other days   Sepsis: patient continues on cefepime. He is currently afebrile with normal wbc count. Cultures are no growth.   Goal of Therapy:  INR 2-3 Monitor platelets by anticoagulation protocol: Yes   Plan:  -Warfarin 5 mg x1 -Monitor daily INR -At discharge would consider 2.5mg /day with 5mg  on Fridays and close INR follow-up next week -Would consider limiting cefepime duration to 7 days total   Pilar Plate  Redmond Pulling PharmD., BCPS Clinical Pharmacist 07/27/2019 7:30 AM

## 2019-07-27 NOTE — Discharge Summary (Addendum)
Physician Discharge Summary  [name] [id number] DOB: 31-Oct-1940 DOA: 07/21/2019  PCP: [name], MD  Admit date: 07/21/2019 Discharge date: 07/27/2019  Admitted From: Home Disposition:  Home.   Recommendations for Outpatient Follow-up:  1. Follow up with PCP in 1-2 weeks 2. Please obtain BMP/CBC in one week 3. Please follow up with cardiology in 3 weeks.  4. Check INR on Sunday and fax It to PCP/ Cardiology.   Home Health:YES Equipment/Devices:rolling walker.   Discharge Condition:Stable.  CODE STATUS: full code.  Diet recommendation: Heart Healthy    Brief/Interim Summary: 79 year old gentleman with prior history of hypertension, seizure disorder, stroke, atrial flutter on Coumadin, dementia, GERD presents to ED with generalized weakness, loss of appetite, loss 20 pounds in the last 3 months.  Patient also reports mild abdominal pain that has resolved now and nonbloody diarrhea for more than 2 to 3 weeks. On arrival patient was febrile tachypneic tachycardic with leukocytosis and chest x-ray showed left lung opacity suspicious for pneumonia.  Patient was started on IV vancomycin and cefepime for concern for sepsis secondary to community-acquired pneumonia. SLP evaluation recommended esophagogram for evaluation of esophageal dysmotility. Cardiology consulted for persistent atrial flutter/atrial fibrillation with RVR. His pneumonia has resolved and he is in NSR.    Discharge Diagnoses:  Principal Problem:   Sepsis ([place]) Active Problems:   Essential hypertension   Stroke ([place])   Seizure disorder (HCC)   Paroxysmal atrial flutter (HCC)   Dementia (AD)   CAP (community acquired pneumonia)   Total bilirubin, elevated  Sepsis Appears to have resolved. Probably secondary to left lower lobe pneumonia, enteritis. CT of the abdomen and pelvis shows colon wall thickening at the level of splenic flexure. Patient was initially started on IV vancomycin and cefepime ,  completed 5 days of cefepime and CXR doe snot show any consolidation.  Improving procalcitonin, for fever resolved, WBC count normalized and breathing has improved. Blood cultures have been negative so far, urine cultures are also negative.  Patient denies any abdominal pain nausea or vomiting at this time.    Persistent atrial fibrillation/flutter with RVR TSH within normal limits. On coumadin for anticoagulation and CHA2DS2-VASc score >3.  Echocardiogram showed Left ventricular ejection fraction, by estimation, is 60 to 65%. The left ventricle has normal function. The left ventricle has no regional  wall motion abnormalities. Left ventricular diastolic function could not  be evaluated. Patient received 1 dose of IV amiodarone and transition to oral 200 mg twice daily Currently patient is in sinus. Cardiology consulted and recommendations given.   Hypokalemia and hypomagnesemia Replaced  Seizure disorder Continue with Keppra.    Colon wall thickening at the level of splenic flexure of the colon GI consulted , recommended to follow up outpatient for colonoscopy. .     Acute metabolic encephalopathy Probably secondary to left lower lobe pneumonia, sepsis, acute illness Patient is back to baseline alert and oriented at this time.    Non-anion gap metabolic acidosis Improved with hydration.    Elevated troponins probably secondary to demand ischemia in the setting of atrial flutter with RVR No evidence of acute ischemia on the EKG.    Physical debility and deconditioning PT and OT eval recommending SNF/ home health with 24 /7 hour supervision.   Supratherapeutic INR Resolved.  Continue with coumadin.     GERD Stable    History of anemia of chronic disease History of gastric AVMs Baseline hemoglobin around 7. Hemoglobin today is around 8.  Continue to monitor.  Discharge Instructions  Discharge Instructions    Diet -  low sodium heart healthy   Complete by: As directed    Increase activity slowly   Complete by: As directed      Allergies as of 07/27/2019   No Known Allergies     Medication List    TAKE these medications   amiodarone 200 MG tablet Commonly known as: PACERONE Take 1 tablet (200 mg total) by mouth 2 (two) times daily.   feeding supplement (ENSURE ENLIVE) Liqd Take 237 mLs by mouth 3 (three) times daily between meals.   ferrous sulfate 325 (65 FE) MG tablet Take 1 tablet (325 mg total) by mouth 3 (three) times daily with meals. What changed: when to take this   folic acid 1 MG tablet Commonly known as: FOLVITE Take 1 tablet (1 mg total) by mouth daily.   levETIRAcetam 500 MG tablet Commonly known as: KEPPRA TAKE 3 TABLETS BY MOUTH TWICE A DAY What changed:   how much to take  how to take this  when to take this  additional instructions   multivitamin with minerals Tabs tablet Take 1 tablet by mouth daily.   pantoprazole 40 MG tablet Commonly known as: PROTONIX Take 1 tablet (40 mg total) by mouth daily for 30 days.   phenytoin 200 MG ER capsule Commonly known as: DILANTIN Take 1 capsule (200 mg total) by mouth at bedtime. What changed: when to take this   polyethylene glycol 17 g packet Commonly known as: MIRALAX / GLYCOLAX Take 17 g by mouth daily as needed for mild constipation.   predniSONE 1 MG tablet Commonly known as: DELTASONE Take 1 tablet (1 mg total) by mouth daily. What changed: medication strength   warfarin 2.5 MG tablet Commonly known as: COUMADIN Take as directed. If you are unsure how to take this medication, talk to your nurse or doctor. Original instructions: Take 1 tablet (2.5 mg total) by mouth See admin instructions. TAKE AS DIRECTED BY COUMADIN CLINIC. Start taking on: Jul 28, 2019            Durable Medical Equipment  (From admission, onward)         Start     Ordered   07/23/19 1552  For home use only DME Walker  rolling  Once    Question Answer Comment  Walker: With 5 Inch Wheels   Patient needs a walker to treat with the following condition Ambulatory dysfunction      07/23/19 1552         Follow-up Information    [facility] Follow up.   Specialty: Cardiology Why: You have an appointment in the [facility] on 08/16/2019 at 10:00am. You can enter and park through [place] code [id number] for the month of May. Contact information: [id number] [place]       [name], MD. Schedule an appointment as soon as possible for a visit in 1 week(s).   Specialty: Internal Medicine Contact information: [address] [place] [address] [address] [phone]          No Known Allergies  Consultations:  Cardiology.    Procedures/Studies: CT ABDOMEN PELVIS W CONTRAST  Result Date: 07/21/2019 CLINICAL DATA:  Diffuse abdominal pain and fever. Generalized weakness. Provided history states postop, however surgery not specified, and review of electronic medical records does not indicate recent surgery EXAM: CT ABDOMEN AND PELVIS WITH CONTRAST TECHNIQUE:  Multidetector CT imaging of the abdomen and pelvis was performed using the standard protocol following bolus administration of intravenous contrast. CONTRAST:  150mL OMNIPAQUE IOHEXOL 300 MG/ML  SOLN COMPARISON:  Abdominopelvic CT 07/21/2018 FINDINGS: Lower chest: Small bilateral pleural effusions, left greater than right. Associated atelectasis. Small pericardial effusion, unchanged from prior exam. Hepatobiliary: Innumerable hepatic granuloma. No discrete focal abnormality. Gallbladder is decompressed. No biliary dilatation. Pancreas: Parenchymal atrophy. No ductal dilatation or inflammation. Spleen: Multiple splenic granuloma. Normal in size. Adrenals/Urinary Tract: Normal adrenal glands. No hydronephrosis or perinephric edema.  Homogeneous renal enhancement with symmetric excretion on delayed phase imaging. Small subcentimeter low-density lesions in the kidneys are too small to characterize but likely cysts. Foley catheter decompresses the urinary bladder. There is mild diffuse bladder wall thickening and perivesicular fat stranding. Stomach/Bowel: Nondistended stomach. No small bowel obstruction or inflammation. Administered enteric contrast reaches the cecum. The appendix is normal. Air in stool throughout the colon. There is sigmoid and transverse colonic redundancy. Scattered diverticulosis without focal diverticulitis. There is wall thickening at the splenic flexure of the colon, series 3, image 15. no adjacent pericolonic edema. Vascular/Lymphatic: Aortic atherosclerosis. No aortic aneurysm. The portal vein is patent. Circumaortic left renal vein. Calcified upper mediastinal lymph nodes consistent with prior granulomatous disease. No noncalcified adenopathy. Reproductive: Prostate is unremarkable. Other: No free air, free fluid, or intra-abdominal fluid collection. There is fat in the inguinal canals. Musculoskeletal: Surgical hardware in the left proximal femur with remote femur fracture. Moderate bilateral osteoarthritis of both hips. Degenerative change throughout the spine. Bones are diffusely under mineralized. There are no acute or suspicious osseous abnormalities. IMPRESSION: 1. Mild diffuse bladder wall thickening and perivesicular fat stranding, can be seen with urinary tract infection. Foley catheter decompresses the urinary bladder. 2. There is mild wall thickening at the splenic flexure of the colon without adjacent inflammation. This is nonspecific, and may be related to peristalsis, however underlying neoplasm is not excluded. Consider follow-up colonoscopy. Significant colonic tortuosity and redundancy with moderate stool burden. Multifocal diverticulosis without diverticulitis. 3. Small bilateral pleural effusions,  left greater than right. Small pericardial effusion which is unchanged. Aortic Atherosclerosis (ICD10-I70.0). Electronically Signed   By: Keith Rake [name]   On: 07/21/2019 17:58   DG CHEST PORT 1 VIEW  Result Date: 07/25/2019 CLINICAL DATA:  Pleural effusion EXAM: PORTABLE CHEST 1 VIEW COMPARISON:  Jul 21, 2019. FINDINGS: There are bilateral pleural effusions, increased on the left and new on the right. There is atelectatic change in each lung base. Heart size and pulmonary vascularity are normal. No adenopathy. Bones are osteoporotic. IMPRESSION: Pleural effusions bilaterally, increased on the left and new on the right compared to most recent study. No appreciable airspace consolidation. Stable cardiac silhouette. No adenopathy evident. Electronically Signed   By: Lowella Grip III [name]   On: 07/25/2019 13:53   DG Chest Port 1 View  Result Date: 07/21/2019 CLINICAL DATA:  Fever. EXAM: PORTABLE CHEST 1 VIEW COMPARISON:  Chest radiograph 12/06/2016 FINDINGS: The heart size and mediastinal contours are within normal limits. The lungs are hyperinflated. There are mild consolidative opacities at the left lung base and a possible tiny pleural effusion. Right lung is clear. No evidence of pneumothorax. No acute finding in the visualized skeleton. IMPRESSION: Mild consolidative opacities at the left lung base may represent atelectasis, aspiration, or pneumonia. Possible tiny left pleural effusion. Recommend repeat radiograph in 3-4 weeks to ensure resolution following course of treatment. Electronically Signed   By: Jac Canavan.D.  On: 07/21/2019 10:58   ECHOCARDIOGRAM COMPLETE  Result Date: 07/24/2019    ECHOCARDIOGRAM REPORT   Patient Name:   [name] Date of Exam: 07/24/2019 Medical Rec #:  [id number]            Height:       73.0 in Accession #:    [id number]           Weight:       149.0 lb Date of Birth:  07/06/1940             BSA:          1.899 m Patient Age:    67 years              BP:           109/63 mmHg Patient Gender: M                    HR:           96 bpm. Exam Location:  Inpatient Procedure: 2D Echo, Cardiac Doppler and Color Doppler Indications:    I48.92* Unspecified atrial flutter  History:        Patient has prior history of Echocardiogram examinations, most                 recent 06/03/2011. Abnormal ECG, Arrythmias:Atrial Flutter and                 RBBB; Signs/Symptoms:Altered Mental Status, Alzheimer's and                 Bacteremia.  Sonographer:    Roseanna Rainbow RDCS Referring Phys: Rem[place]  1. Left ventricular ejection fraction, by estimation, is 60 to 65%. The left ventricle has normal function. The left ventricle has no regional wall motion abnormalities. Left ventricular diastolic function could not be evaluated.  2. Right ventricular systolic function is normal. The right ventricular size is normal.  3. Right atrial size was mildly dilated.  4. Moderate pleural effusion in both left and right lateral regions.  5. The mitral valve is normal in structure. Trivial mitral valve regurgitation. No evidence of mitral stenosis.  6. The aortic valve is grossly normal. Aortic valve regurgitation is not visualized. No aortic stenosis is present.  7. Aortic dilatation noted. There is mild dilatation of the ascending aorta measuring 39 mm.  8. The inferior vena cava is normal in size with greater than 50% respiratory variability, suggesting right atrial pressure of 3 mmHg. Comparison(s): No significant change from prior study. FINDINGS  Left Ventricle: Left ventricular ejection fraction, by estimation, is 60 to 65%. The left ventricle has normal function. The left ventricle has no regional wall motion abnormalities. The left ventricular internal cavity size was normal in size. There is  no left ventricular hypertrophy. Left ventricular diastolic function could not be evaluated due to atrial fibrillation. Left ventricular diastolic function could not be  evaluated. Right Ventricle: The right ventricular size is normal. No increase in right ventricular wall thickness. Right ventricular systolic function is normal. Left Atrium: Left atrial size was normal in size. Right Atrium: Right atrial size was mildly dilated. Pericardium: A small pericardial effusion is present. Mitral Valve: The mitral valve is normal in structure. Trivial mitral valve regurgitation. No evidence of mitral valve stenosis. Tricuspid Valve: The tricuspid valve is normal in structure. Tricuspid valve regurgitation is trivial. No evidence of tricuspid stenosis. Aortic Valve: The aortic valve is grossly normal.  Aortic valve regurgitation is not visualized. No aortic stenosis is present. Pulmonic Valve: The pulmonic valve was not well visualized. Pulmonic valve regurgitation is not visualized. No evidence of pulmonic stenosis. Aorta: Aortic dilatation noted. There is mild dilatation of the ascending aorta measuring 39 mm. Venous: The inferior vena cava is normal in size with greater than 50% respiratory variability, suggesting right atrial pressure of 3 mmHg. IAS/Shunts: No atrial level shunt detected by color flow Doppler. Additional Comments: There is a moderate pleural effusion in both left and right lateral regions.  LEFT VENTRICLE PLAX 2D LVIDd:         3.70 cm LVIDs:         2.50 cm LV PW:         1.20 cm LV IVS:        1.10 cm LVOT diam:     1.70 cm LV SV:         54 LV SV Index:   28 LVOT Area:     2.27 cm  LV Volumes (MOD) LV vol d, MOD A2C: 37.0 ml LV vol d, MOD A4C: 39.9 ml LV vol s, MOD A2C: 12.9 ml LV vol s, MOD A4C: 17.5 ml LV SV MOD A2C:     24.1 ml LV SV MOD A4C:     39.9 ml LV SV MOD BP:      23.2 ml RIGHT VENTRICLE         IVC TAPSE (M-mode): 2.1 cm  IVC diam: 1.50 cm LEFT ATRIUM             Index       RIGHT ATRIUM           Index LA diam:        2.90 cm 1.53 cm/m  RA Area:     18.90 cm LA Vol (A2C):   59.5 ml 31.34 ml/m RA Volume:   58.20 ml  30.65 ml/m LA Vol (A4C):   41.1 ml  21.65 ml/m LA Biplane Vol: 50.7 ml 26.70 ml/m  AORTIC VALVE LVOT Vmax:   156.00 cm/s LVOT Vmean:  95.100 cm/s LVOT VTI:    0.238 m  AORTA Ao Root diam: 3.60 cm Ao Asc diam:  3.90 cm MITRAL VALVE MV Area (PHT): 4.15 cm     SHUNTS MV Decel Time: 183 msec     Systemic VTI:  0.24 m MV E velocity: 111.00 cm/s  Systemic Diam: 1.70 cm Buford Dresser MD Electronically signed by Buford Dresser MD Signature Date/Time: 07/24/2019/7:18:25 PM    Final    DG ESOPHAGUS W SINGLE CM (SOL OR THIN BA)  Result Date: 07/25/2019 CLINICAL DATA:  Throat clearing, globus sensation EXAM: ESOPHOGRAM/BARIUM SWALLOW TECHNIQUE: Single contrast examination was performed using barium. FLUOROSCOPY TIME:  Fluoroscopy Time:  54 seconds COMPARISON:  None. FINDINGS: Patient swallowed barium without difficulty. There is no mass, stricture, or other abnormality identified. Normal motility. No hiatal hernia. There is a subcentimeter epiphrenic diverticulum. No evidence of spontaneous gastroesophageal reflux. IMPRESSION: Subcentimeter epiphrenic diverticulum. Otherwise unremarkable study. Electronically Signed   By: Macy Mis [name]   On: 07/25/2019 10:28       Subjective: No new complaints.   Discharge Exam: Vitals:   07/27/19 0402 07/27/19 0732  BP: 125/64   Pulse: 82 89  Resp: 20   Temp: 98 F (36.7 C) 98.3 F (36.8 C)  SpO2: 99% 98%   Vitals:   07/26/19 2224 07/27/19 0000 07/27/19 0402 07/27/19 0732  BP: 133/72 117/65 125/64  Pulse: 88 80 82 89  Resp: 14 19 20    Temp: 98.7 F (37.1 C)  98 F (36.7 C) 98.3 F (36.8 C)  TempSrc: Oral  Oral Oral  SpO2: 95% 96% 99% 98%  Weight:   66.8 kg   Height:        General: Pt is alert, awake, not in acute distress Cardiovascular: RRR, S1/S2 +, no rubs, no gallops Respiratory: CTA bilaterally, no wheezing, no rhonchi Abdominal: Soft, NT, ND, bowel sounds + Extremities: no edema, no cyanosis    The results of significant diagnostics from this  hospitalization (including imaging, microbiology, ancillary and laboratory) are listed below for reference.     Microbiology: Recent Results (from the past 240 hour(s))  Blood Culture (routine x 2)     Status: None   Collection Time: 07/21/19  9:48 AM   Specimen: BLOOD LEFT HAND  Result Value Ref Range Status   Specimen Description BLOOD LEFT HAND  Final   Special Requests   Final    BOTTLES DRAWN AEROBIC AND ANAEROBIC Blood Culture results may not be optimal due to an inadequate volume of blood received in culture bottles   Culture   Final    NO GROWTH 5 DAYS Performed at [facility], [place] [address], [place], [place] [address]    Report Status 07/26/2019 FINAL  Final  Blood Culture (routine x 2)     Status: None   Collection Time: 07/21/19  9:51 AM   Specimen: BLOOD  Result Value Ref Range Status   Specimen Description BLOOD RIGHT ANTECUBITAL  Final   Special Requests   Final    BOTTLES DRAWN AEROBIC ONLY Blood Culture results may not be optimal due to an inadequate volume of blood received in culture bottles   Culture   Final    NO GROWTH 5 DAYS Performed at [facility], [place] [address], [place], [place] [address]    Report Status 07/26/2019 FINAL  Final  Respiratory Panel by RT PCR (Flu A&B, Covid) - Nasopharyngeal Swab     Status: None   Collection Time: 07/21/19 12:25 PM   Specimen: Nasopharyngeal Swab  Result Value Ref Range Status   SARS Coronavirus 2 by RT PCR NEGATIVE NEGATIVE Final    Comment: (NOTE) SARS-CoV-2 target nucleic acids are NOT DETECTED. The SARS-CoV-2 RNA is generally detectable in upper respiratoy specimens during the acute phase of infection. The lowest concentration of SARS-CoV-2 viral copies this assay can detect is 131 copies/mL. A negative result does not preclude SARS-Cov-2 infection and should not be used as the sole basis for treatment or other patient management decisions. A negative result may occur with  improper specimen  collection/handling, submission of specimen other than nasopharyngeal swab, presence of viral mutation(s) within the areas targeted by this assay, and inadequate number of viral copies (<131 copies/mL). A negative result must be combined with clinical observations, patient history, and epidemiological information. The expected result is Negative. Fact Sheet for Patients:  PinkCheek.be Fact Sheet for Healthcare Providers:  GravelBags.it This test is not yet ap proved or cleared by the [place] FDA and  has been authorized for detection and/or diagnosis of SARS-CoV-2 by FDA under an Emergency Use Authorization (EUA). This EUA will remain  in effect (meaning this test can be used) for the duration of the COVID-19 declaration under Section 564(b)(1) of the Act, 21 U.S.C. section 360bbb-3(b)(1), unless the authorization is terminated or revoked sooner.    Influenza A by PCR  NEGATIVE NEGATIVE Final   Influenza B by PCR NEGATIVE NEGATIVE Final    Comment: (NOTE) The Xpert Xpress SARS-CoV-2/FLU/RSV assay is intended as an aid in  the diagnosis of influenza from Nasopharyngeal swab specimens and  should not be used as a sole basis for treatment. Nasal washings and  aspirates are unacceptable for Xpert Xpress SARS-CoV-2/FLU/RSV  testing. Fact Sheet for Patients: PinkCheek.be Fact Sheet for Healthcare Providers: GravelBags.it This test is not yet approved or cleared by the [place] FDA and  has been authorized for detection and/or diagnosis of SARS-CoV-2 by  FDA under an Emergency Use Authorization (EUA). This EUA will remain  in effect (meaning this test can be used) for the duration of the  Covid-19 declaration under Section 564(b)(1) of the Act, 21  U.S.C. section 360bbb-3(b)(1), unless the authorization is  terminated or revoked. Performed at [facility], [place] [address], [place], [place] [address]   Urine culture     Status: None   Collection Time: 07/21/19  1:13 PM   Specimen: In/Out Cath Urine  Result Value Ref Range Status   Specimen Description IN/OUT CATH URINE  Final   Special Requests NONE  Final   Culture   Final    NO GROWTH Performed at [facility], [place] [address] [place], [place] [address]    Report Status 07/22/2019 FINAL  Final  MRSA PCR Screening     Status: None   Collection Time: 07/21/19  7:53 PM   Specimen: Nasopharyngeal  Result Value Ref Range Status   MRSA by PCR NEGATIVE NEGATIVE Final    Comment:        The GeneXpert MRSA Assay (FDA approved for NASAL specimens only), is one component of a comprehensive MRSA colonization surveillance program. It is not intended to diagnose MRSA infection nor to guide or monitor treatment for MRSA infections. Performed at [facility], [place] [address] [place], [place] [address]   SARS CORONAVIRUS 2 (TAT 6-24 HRS) Nasopharyngeal Nasopharyngeal Swab     Status: None   Collection Time: 07/25/19  1:37 PM   Specimen: Nasopharyngeal Swab  Result Value Ref Range Status   SARS Coronavirus 2 NEGATIVE NEGATIVE Final    Comment: (NOTE) SARS-CoV-2 target nucleic acids are NOT DETECTED. The SARS-CoV-2 RNA is generally detectable in upper and lower respiratory specimens during the acute phase of infection. Negative results do not preclude SARS-CoV-2 infection, do not rule out co-infections with other pathogens, and should not be used as the sole basis for treatment or other patient management decisions. Negative results must be combined with clinical observations, patient history, and epidemiological information. The expected result is Negative. Fact Sheet for Patients: SugarRoll.be Fact Sheet for Healthcare Providers: https://www.woods-mathews.com/ This test is not yet approved or cleared by the [place] FDA and  has  been authorized for detection and/or diagnosis of SARS-CoV-2 by FDA under an Emergency Use Authorization (EUA). This EUA will remain  in effect (meaning this test can be used) for the duration of the COVID-19 declaration under Section 56 4(b)(1) of the Act, 21 U.S.C. section 360bbb-3(b)(1), unless the authorization is terminated or revoked sooner. Performed at [facility], [place] [address] [place], [place] [address]      Labs: BNP (last 3 results) No results for input(s): BNP in the last 8760 hours. Basic Metabolic Panel: Recent Labs  Lab 07/21/19 0951 07/21/19 1742 07/22/19 0241 07/23/19 0239 07/24/19 0253 07/25/19 0257  NA 138  --  134* 135 136 140  K 4.1  --  3.5 4.1 3.5 3.6  CL 99  --  100 107 114* 116*  CO2 27  --  20* 21* 17* 20*  GLUCOSE 101*  --  100* 138* 112* 110*  BUN 15  --  16 14 9 9   CREATININE 0.86  --  0.84 0.75 0.70 0.71  CALCIUM 9.1  --  8.3* 7.7* 7.4* 7.7*  MG  --  1.5*  --  2.0  --   --   PHOS  --  3.1  --   --   --   --    Liver Function Tests: Recent Labs  Lab 07/21/19 0951 07/22/19 0241  AST 27 32  ALT 14 13  ALKPHOS 83 73  BILITOT 2.4* 1.8*  PROT 7.4 6.6  ALBUMIN 4.2 3.5   No results for input(s): LIPASE, AMYLASE in the last 168 hours. No results for input(s): AMMONIA in the last 168 hours. CBC: Recent Labs  Lab 07/21/19 0951 07/21/19 0951 07/22/19 0241 07/23/19 0239 07/24/19 0253 07/25/19 0257 07/26/19 0309  WBC 16.8*   < > 16.4* 10.8* 7.0 4.9 5.3  NEUTROABS 14.1*  --   --  8.5* 6.0 2.9 2.9  HGB 10.6*   < > 10.5* 8.7* 8.4* 8.1* 8.0*  HCT 33.0*   < > 33.0* 26.5* 25.1* 24.1* 23.4*  MCV 93.0   < > 94.0 92.3 91.3 89.6 88.6  PLT 234   < > 216 203 205 237 249   < > = values in this interval not displayed.   Cardiac Enzymes: No results for input(s): CKTOTAL, CKMB, CKMBINDEX, TROPONINI in the last 168 hours. BNP: Invalid input(s): POCBNP CBG: Recent Labs  Lab 07/21/19 1743  GLUCAP 80   D-Dimer No results for input(s):  DDIMER in the last 72 hours. Hgb A1c No results for input(s): HGBA1C in the last 72 hours. Lipid Profile No results for input(s): CHOL, HDL, LDLCALC, TRIG, CHOLHDL, LDLDIRECT in the last 72 hours. Thyroid function studies No results for input(s): TSH, T4TOTAL, T3FREE, THYROIDAB in the last 72 hours.  Invalid input(s): FREET3 Anemia work up No results for input(s): VITAMINB12, FOLATE, FERRITIN, TIBC, IRON, RETICCTPCT in the last 72 hours. Urinalysis    Component Value Date/Time   COLORURINE YELLOW 07/21/2019 1230   APPEARANCEUR CLEAR 07/21/2019 1230   LABSPEC 1.017 07/21/2019 1230   PHURINE 5.0 07/21/2019 1230   GLUCOSEU NEGATIVE 07/21/2019 1230   HGBUR LARGE (A) 07/21/2019 1230   BILIRUBINUR NEGATIVE 07/21/2019 1230   KETONESUR 20 (A) 07/21/2019 1230   PROTEINUR 30 (A) 07/21/2019 1230   UROBILINOGEN 0.2 06/02/2011 0300   NITRITE NEGATIVE 07/21/2019 1230   LEUKOCYTESUR NEGATIVE 07/21/2019 1230   Sepsis Labs Invalid input(s): PROCALCITONIN,  WBC,  LACTICIDVEN Microbiology Recent Results (from the past 240 hour(s))  Blood Culture (routine x 2)     Status: None   Collection Time: 07/21/19  9:48 AM   Specimen: BLOOD LEFT HAND  Result Value Ref Range Status   Specimen Description BLOOD LEFT HAND  Final   Special Requests   Final    BOTTLES DRAWN AEROBIC AND ANAEROBIC Blood Culture results may not be optimal due to an inadequate volume of blood received in culture bottles   Culture   Final    NO GROWTH 5 DAYS Performed at [facility], [place] [address] [place], [place] [address]    Report Status 07/26/2019 FINAL  Final  Blood Culture (routine x 2)  Status: None   Collection Time: 07/21/19  9:51 AM   Specimen: BLOOD  Result Value Ref Range Status   Specimen Description BLOOD RIGHT ANTECUBITAL  Final   Special Requests   Final    BOTTLES DRAWN AEROBIC ONLY Blood Culture results may not be optimal due to an inadequate volume of blood received in culture bottles    Culture   Final    NO GROWTH 5 DAYS Performed at [facility], [place] [address] [place], [place] [address]    Report Status 07/26/2019 FINAL  Final  Respiratory Panel by RT PCR (Flu A&B, Covid) - Nasopharyngeal Swab     Status: None   Collection Time: 07/21/19 12:25 PM   Specimen: Nasopharyngeal Swab  Result Value Ref Range Status   SARS Coronavirus 2 by RT PCR NEGATIVE NEGATIVE Final    Comment: (NOTE) SARS-CoV-2 target nucleic acids are NOT DETECTED. The SARS-CoV-2 RNA is generally detectable in upper respiratoy specimens during the acute phase of infection. The lowest concentration of SARS-CoV-2 viral copies this assay can detect is 131 copies/mL. A negative result does not preclude SARS-Cov-2 infection and should not be used as the sole basis for treatment or other patient management decisions. A negative result may occur with  improper specimen collection/handling, submission of specimen other than nasopharyngeal swab, presence of viral mutation(s) within the areas targeted by this assay, and inadequate number of viral copies (<131 copies/mL). A negative result must be combined with clinical observations, patient history, and epidemiological information. The expected result is Negative. Fact Sheet for Patients:  PinkCheek.be Fact Sheet for Healthcare Providers:  GravelBags.it This test is not yet ap proved or cleared by the [place] FDA and  has been authorized for detection and/or diagnosis of SARS-CoV-2 by FDA under an Emergency Use Authorization (EUA). This EUA will remain  in effect (meaning this test can be used) for the duration of the COVID-19 declaration under Section 564(b)(1) of the Act, 21 U.S.C. section 360bbb-3(b)(1), unless the authorization is terminated or revoked sooner.    Influenza A by PCR NEGATIVE NEGATIVE Final   Influenza B by PCR NEGATIVE NEGATIVE Final    Comment: (NOTE) The Xpert  Xpress SARS-CoV-2/FLU/RSV assay is intended as an aid in  the diagnosis of influenza from Nasopharyngeal swab specimens and  should not be used as a sole basis for treatment. Nasal washings and  aspirates are unacceptable for Xpert Xpress SARS-CoV-2/FLU/RSV  testing. Fact Sheet for Patients: PinkCheek.be Fact Sheet for Healthcare Providers: GravelBags.it This test is not yet approved or cleared by the [place] FDA and  has been authorized for detection and/or diagnosis of SARS-CoV-2 by  FDA under an Emergency Use Authorization (EUA). This EUA will remain  in effect (meaning this test can be used) for the duration of the  Covid-19 declaration under Section 564(b)(1) of the Act, 21  U.S.C. section 360bbb-3(b)(1), unless the authorization is  terminated or revoked. Performed at [facility], [place] [address], [place], [place] [address]   Urine culture     Status: None   Collection Time: 07/21/19  1:13 PM   Specimen: In/Out Cath Urine  Result Value Ref Range Status   Specimen Description IN/OUT CATH URINE  Final   Special Requests NONE  Final   Culture   Final    NO GROWTH Performed at [facility], [place] [address], [place], [place] [address]    Report Status 07/22/2019 FINAL  Final  MRSA PCR Screening  Status: None   Collection Time: 07/21/19  7:53 PM   Specimen: Nasopharyngeal  Result Value Ref Range Status   MRSA by PCR NEGATIVE NEGATIVE Final    Comment:        The GeneXpert MRSA Assay (FDA approved for NASAL specimens only), is one component of a comprehensive MRSA colonization surveillance program. It is not intended to diagnose MRSA infection nor to guide or monitor treatment for MRSA infections. Performed at [facility], [place] [address] [place], [place] [address]   SARS CORONAVIRUS 2 (TAT 6-24 HRS) Nasopharyngeal Nasopharyngeal Swab     Status: None   Collection Time: 07/25/19  1:37 PM    Specimen: Nasopharyngeal Swab  Result Value Ref Range Status   SARS Coronavirus 2 NEGATIVE NEGATIVE Final    Comment: (NOTE) SARS-CoV-2 target nucleic acids are NOT DETECTED. The SARS-CoV-2 RNA is generally detectable in upper and lower respiratory specimens during the acute phase of infection. Negative results do not preclude SARS-CoV-2 infection, do not rule out co-infections with other pathogens, and should not be used as the sole basis for treatment or other patient management decisions. Negative results must be combined with clinical observations, patient history, and epidemiological information. The expected result is Negative. Fact Sheet for Patients: SugarRoll.be Fact Sheet for Healthcare Providers: https://www.woods-mathews.com/ This test is not yet approved or cleared by the [place] FDA and  has been authorized for detection and/or diagnosis of SARS-CoV-2 by FDA under an Emergency Use Authorization (EUA). This EUA will remain  in effect (meaning this test can be used) for the duration of the COVID-19 declaration under Section 56 4(b)(1) of the Act, 21 U.S.C. section 360bbb-3(b)(1), unless the authorization is terminated or revoked sooner. Performed at [facility], [place] [address], [place], [place] [address]      Time coordinating discharge: 34 minutes.   SIGNED:   Hosie Poisson, MD  [facility] 07/27/2019, 11:42 AM

## 2019-07-27 NOTE — Progress Notes (Signed)
This encounter was created in error - please disregard.

## 2019-07-27 NOTE — Progress Notes (Addendum)
Physical Therapy Treatment Patient Details Name: [name] MRN: [id number] DOB: Nov 20, 1940 Today's Date: 07/27/2019    History of Present Illness 79 yo admittted with weakness with sepsis due to PNA with metabolic encephalopathy. PMHx: HTN, Sz, CVA, Aflutter, dementia    PT Comments    Pt very pleasant although HOH and able to progress gait this session. Pt with improved stability with gait but continues to have difficulty with RLE movement, control of gait and transfers and demonstrates decreased cognition and safety. Pt continues to require supervision for safety/cognition as well as assist for mobility. Pt educated for assist with nursing and chair alarm on.    Follow Up Recommendations  Supervision/Assistance - 24 hour;SNF, could return home with 24hr min assist and HHPT     Equipment Recommendations  Rolling walker with 5" wheels    Recommendations for Other Services       Precautions / Restrictions Precautions Precautions: Fall Restrictions Weight Bearing Restrictions: No    Mobility  Bed Mobility Overal bed mobility: Needs Assistance Bed Mobility: Supine to Sit     Supine to sit: Min guard;HOB elevated     General bed mobility comments: HOb 30 degrees with use of rail and no physical assist  Transfers Overall transfer level: Needs assistance   Transfers: Sit to/from Stand Sit to Stand: Min assist         General transfer comment: min assist to stand from bed and toilet with assist to rise, cues for hand placement  Ambulation/Gait Ambulation/Gait assistance: Min assist Gait Distance (Feet): 140 Feet Assistive device: Rolling walker (2 wheeled) Gait Pattern/deviations: Step-through pattern;Decreased stride length;Trunk flexed   Gait velocity interpretation: 1.31 - 2.62 ft/sec, indicative of limited community ambulator General Gait Details: pt with improved gait distance with decreased stepping on RLE and decreased control of RLE movement with  use of RW. Cues for direction and safety with cues to push not pick up RW   Stairs             Wheelchair Mobility    Modified Rankin (Stroke Patients Only)       Balance Overall balance assessment: Needs assistance   Sitting balance-Leahy Scale: Fair     Standing balance support: Bilateral upper extremity supported Standing balance-Leahy Scale: Poor                              Cognition Arousal/Alertness: Awake/alert Behavior During Therapy: WFL for tasks assessed/performed Overall Cognitive Status: Impaired/Different from baseline Area of Impairment: Orientation;Memory;Following commands;Safety/judgement;Problem solving                 Orientation Level: Disoriented to;Time;Situation Current Attention Level: Selective Memory: Decreased short-term memory Following Commands: Follows one step commands with increased time;Follows one step commands consistently Safety/Judgement: Decreased awareness of safety;Decreased awareness of deficits   Problem Solving: Difficulty sequencing;Requires verbal cues;Requires tactile cues General Comments: Pt performing tasks with simple commands and repeated instructions due to [facility]. pt unaware of year or how to perform simple money management. Pt oriented to president and able to state how to call 911      Exercises General Exercises - Lower Extremity Long Arc Quad: AROM;Both;Seated;15 reps Hip Flexion/Marching: AROM;Both;Seated;15 reps    General Comments        Pertinent Vitals/Pain Pain Assessment: No/denies pain    Home Living  Prior Function            PT Goals (current goals can now be found in the care plan section) Progress towards PT goals: Progressing toward goals    Frequency    Min 2X/week      PT Plan Current plan remains appropriate    Co-evaluation              AM-PAC PT "6 Clicks" Mobility   Outcome Measure  Help needed turning from  your back to your side while in a flat bed without using bedrails?: A Little Help needed moving from lying on your back to sitting on the side of a flat bed without using bedrails?: A Little Help needed moving to and from a bed to a chair (including a wheelchair)?: A Little Help needed standing up from a chair using your arms (e.g., wheelchair or bedside chair)?: A Little Help needed to walk in hospital room?: A Little Help needed climbing 3-5 steps with a railing? : A Lot 6 Click Score: 17    End of Session Equipment Utilized During Treatment: Gait belt Activity Tolerance: Patient tolerated treatment well Patient left: in chair;with call bell/phone within reach;with chair alarm set Nurse Communication: Mobility status PT Visit Diagnosis: Other abnormalities of gait and mobility (R26.89);Difficulty in walking, not elsewhere classified (R26.2);Muscle weakness (generalized) (M62.81)     Time: [id number] PT Time Calculation (min) (ACUTE ONLY): 35 min  Charges:  $Gait Training: 8-22 mins $Therapeutic Activity: 8-22 mins                     Valentino Saavedra P, PT Acute Rehabilitation Services Pager: [phone] Office: [place] 07/27/2019, 11:11 AM

## 2019-07-27 NOTE — Progress Notes (Signed)
Inpatient Rehabilitation-Admissions Coordinator   Unfortunately, I do not have a bed available in CIR for this patient today or this weekend. I have notified TOC team and MD. Therapy recommendations are still SNF. Feel this is appropriate as pt has progressed to a Min A ambulation level (from Mod A +2 at last therapy session). As his goals for DC would most likely be Supervision due to underlying cognitive deficits, pt will most likley not require an IP Rehab stay by the time a bed becomes available on CIR.   Raechel Ache, OTR/L  Rehab Admissions Coordinator  [phone] 07/27/2019 11:30 AM

## 2019-07-27 NOTE — TOC Progression Note (Addendum)
Transition of Care [facility]) - Progression Note    Patient Details  Name: [name] MRN: [id number] Date of Birth: [birth year]  Transition of Care [facility]) CM/SW [place], [place] Phone Number: 07/27/2019, 11:39 AM  Clinical Narrative:     Update: last RW paid for by insurance in 2018, not eligible for covered insurance RW until 2023. CSW spoke with [name] to inquire if she wanted to pay $99 today for one, she reports no. CSW updated Zack with [employer].   CSW spoke with patient's daughter [name] regarding lack of CIR beds, [name] continues to be resistant to SNF and would like to take patient home with home health. She reports she is able to provide 24/7 assistance to patient.   Preference for [facility], will need rolling walker and already has 3in1 and shower bench at home. [name] will be picking patient up today once he is ready.   CSW made referral to Santiago Glad with [facility] for PT, OT, RN, aide and social work.   CSW reached out to [facility] with [employer] to get DME rolling walker delivered to room.   Expected Discharge Plan: [facility](TBD) Barriers to Discharge: Continued Medical Work up  Expected Discharge Plan and Services Expected Discharge Plan: [facility](TBD)       Living arrangements for the past 2 months: Single Family Home Expected Discharge Date: 07/27/19               DME Arranged: [place]ford Rile rolling   Date DME Agency Contacted: 07/27/19 Time DME Agency Contacted: [phone] Representative spoke with at DME Agency: Zack HH Arranged: PT, OT, RN, Nurse's Aide, Social Work [employer] Agency: [place] ([place]) Date [place]: 07/27/19 Time [place]: 1139 Representative spoke with at [place]: [place] ([place]) Interventions    Readmission Risk Interventions Readmission Risk Prevention Plan 07/25/2018  Transportation Screening Complete  PCP or Specialist Appt  within 5-7 Days Complete  Home Care Screening Complete  Medication Review (RN CM) Complete  Some recent data might be hidden

## 2019-07-27 NOTE — Progress Notes (Signed)
PROGRESS NOTE    [name]  [id number] DOB: [birth year] DOA: 07/21/2019 PCP: [name], MD   Chief Complaint  Patient presents with  . Weakness    Brief Narrative:  79 year old gentleman with prior history of hypertension, seizure disorder, stroke, atrial flutter on Coumadin, dementia, GERD presents to ED with generalized weakness, loss of appetite, loss 20 pounds in the last 3 months.  Patient also reports mild abdominal pain that has resolved now and nonbloody diarrhea for more than 2 to 3 weeks. On arrival patient was febrile tachypneic tachycardic with leukocytosis and chest x-ray showed left lung opacity suspicious for pneumonia.  Patient was started on IV vancomycin and cefepime for concern for sepsis secondary to community-acquired pneumonia. SLP evaluation recommended esophagogram for evaluation of esophageal dysmotility. Cardiology consulted for persistent atrial flutter/atrial fibrillation with RVR.  Assessment & Plan:   Principal Problem:   Sepsis ([place] [place]) Active Problems:   Essential hypertension   Stroke (HCC)   Seizure disorder (HCC)   Paroxysmal atrial flutter (HCC)   Dementia (AD)   CAP (community acquired pneumonia)   Total bilirubin, elevated   Sepsis Appears to have resolved. Probably secondary to left lower lobe pneumonia, enteritis. CT of the abdomen and pelvis shows colon wall thickening at the level of splenic flexure. Patient was initially started on IV vancomycin and cefepime , completed 5 days of cefepime and CXR doe snot show any consolidation.  Improving procalcitonin, for fever resolved, WBC count normalized and breathing has improved. Blood cultures have been negative so far, urine cultures are also negative.  Patient denies any abdominal pain nausea or vomiting at this time.    Persistent atrial fibrillation/flutter with RVR TSH within normal limits Echocardiogram showed Left ventricular ejection fraction, by estimation, is 60 to  65%. The left ventricle has normal function. The left ventricle has no regional  wall motion abnormalities. Left ventricular diastolic function could not  be evaluated. Patient received 1 dose of IV amiodarone and transition to oral 200 mg twice daily Currently patient is in sinus. Cardiology consulted and recommendations given.   Hypokalemia and hypomagnesemia Replaced  Seizure disorder Continue with Keppra.    Colon wall thickening at the level of splenic flexure of the colon GI consulted , recommended to follow up outpatient.     Acute metabolic encephalopathy Probably secondary to left lower lobe pneumonia, sepsis, acute illness Patient is back to baseline alert and oriented at this time.    Non-anion gap metabolic acidosis Improved with hydration.    Elevated troponins probably secondary to demand ischemia in the setting of atrial flutter with RVR No evidence of acute ischemia on the EKG.    Physical debility and deconditioning PT and OT eval recommending SNF  Supratherapeutic INR Persistently high. No signs of bleeding.  Hold Coumadin tonight and pharmacy to dose the Coumadin    GERD Stable    History of anemia of chronic disease History of gastric AVMs Baseline hemoglobin around 7. Hemoglobin today is around 8.  Continue to monitor.    DVT prophylaxis: Coumadin  code Status full code Family Communication: None at bedside Status is: Inpatient  Remains inpatient appropriate because:IV treatments appropriate due to intensity of illness or inability to take PO   Dispo: The patient is from: Home              Anticipated d/c is to: SNF              Anticipated d/c date is: 1 day  Patient currently is not medically stable to d/c.        Consultants:   Cardiology   Procedures: Esophagogram  Antimicrobials: cefepime   Subjective: No new complaints.   Objective: Vitals:   07/26/19 2224 07/27/19 0000 07/27/19 0402  07/27/19 0732  BP: 133/72 117/65 125/64   Pulse: 88 80 82 89  Resp: 14 19 20    Temp: 98.7 F (37.1 C)  98 F (36.7 C) 98.3 F (36.8 C)  TempSrc: Oral  Oral Oral  SpO2: 95% 96% 99% 98%  Weight:   66.8 kg   Height:        Intake/Output Summary (Last 24 hours) at 07/27/2019 1138 Last data filed at 07/27/2019 0900 Gross per 24 hour  Intake 370 ml  Output 375 ml  Net -5 ml   Filed Weights   07/25/19 0444 07/26/19 0253 07/27/19 0402  Weight: 69.4 kg 68.8 kg 66.8 kg    Examination:  General exam: alert and comfortable.  Respiratory system: diminished at bases, no wheezing or rhonchi.  Cardiovascular system:S1s2 RRR, no JVD.  Gastrointestinal system: Abd is soft , non tender non distended, bowel sounds.  Central nervous system: Alert and oriented.  Extremities: No pedal edema.  Skin: no rashes.  Psychiatry: Mood & affect appropriate.     Data Reviewed: I have personally reviewed following labs and imaging studies  CBC: Recent Labs  Lab 07/21/19 0951 07/21/19 0951 07/22/19 0241 07/23/19 0239 07/24/19 0253 07/25/19 0257 07/26/19 0309  WBC 16.8*   < > 16.4* 10.8* 7.0 4.9 5.3  NEUTROABS 14.1*  --   --  8.5* 6.0 2.9 2.9  HGB 10.6*   < > 10.5* 8.7* 8.4* 8.1* 8.0*  HCT 33.0*   < > 33.0* 26.5* 25.1* 24.1* 23.4*  MCV 93.0   < > 94.0 92.3 91.3 89.6 88.6  PLT 234   < > 216 203 205 237 249   < > = values in this interval not displayed.    Basic Metabolic Panel: Recent Labs  Lab 07/21/19 0951 07/21/19 1742 07/22/19 0241 07/23/19 0239 07/24/19 0253 07/25/19 0257  NA 138  --  134* 135 136 140  K 4.1  --  3.5 4.1 3.5 3.6  CL 99  --  100 107 114* 116*  CO2 27  --  20* 21* 17* 20*  GLUCOSE 101*  --  100* 138* 112* 110*  BUN 15  --  16 14 9 9   CREATININE 0.86  --  0.84 0.75 0.70 0.71  CALCIUM 9.1  --  8.3* 7.7* 7.4* 7.7*  MG  --  1.5*  --  2.0  --   --   PHOS  --  3.1  --   --   --   --     GFR: Estimated Creatinine Clearance: 69.6 mL/min (by C-G formula based on  SCr of 0.71 mg/dL).  Liver Function Tests: Recent Labs  Lab 07/21/19 0951 07/22/19 0241  AST 27 32  ALT 14 13  ALKPHOS 83 73  BILITOT 2.4* 1.8*  PROT 7.4 6.6  ALBUMIN 4.2 3.5    CBG: Recent Labs  Lab 07/21/19 1743  GLUCAP 80     Recent Results (from the past 240 hour(s))  Blood Culture (routine x 2)     Status: None   Collection Time: 07/21/19  9:48 AM   Specimen: BLOOD LEFT HAND  Result Value Ref Range Status   Specimen Description BLOOD LEFT HAND  Final   Special Requests  Final    BOTTLES DRAWN AEROBIC AND ANAEROBIC Blood Culture results may not be optimal due to an inadequate volume of blood received in culture bottles   Culture   Final    NO GROWTH 5 DAYS Performed at [facility], [place] [address], [place], [place] [address]    Report Status 07/26/2019 FINAL  Final  Blood Culture (routine x 2)     Status: None   Collection Time: 07/21/19  9:51 AM   Specimen: BLOOD  Result Value Ref Range Status   Specimen Description BLOOD RIGHT ANTECUBITAL  Final   Special Requests   Final    BOTTLES DRAWN AEROBIC ONLY Blood Culture results may not be optimal due to an inadequate volume of blood received in culture bottles   Culture   Final    NO GROWTH 5 DAYS Performed at [facility], [place] [address], [place], [place] [address]    Report Status 07/26/2019 FINAL  Final  Respiratory Panel by RT PCR (Flu A&B, Covid) - Nasopharyngeal Swab     Status: None   Collection Time: 07/21/19 12:25 PM   Specimen: Nasopharyngeal Swab  Result Value Ref Range Status   SARS Coronavirus 2 by RT PCR NEGATIVE NEGATIVE Final    Comment: (NOTE) SARS-CoV-2 target nucleic acids are NOT DETECTED. The SARS-CoV-2 RNA is generally detectable in upper respiratoy specimens during the acute phase of infection. The lowest concentration of SARS-CoV-2 viral copies this assay can detect is 131 copies/mL. A negative result does not preclude SARS-Cov-2 infection and should not be used as the  sole basis for treatment or other patient management decisions. A negative result may occur with  improper specimen collection/handling, submission of specimen other than nasopharyngeal swab, presence of viral mutation(s) within the areas targeted by this assay, and inadequate number of viral copies (<131 copies/mL). A negative result must be combined with clinical observations, patient history, and epidemiological information. The expected result is Negative. Fact Sheet for Patients:  PinkCheek.be Fact Sheet for Healthcare Providers:  GravelBags.it This test is not yet ap proved or cleared by the [place] FDA and  has been authorized for detection and/or diagnosis of SARS-CoV-2 by FDA under an Emergency Use Authorization (EUA). This EUA will remain  in effect (meaning this test can be used) for the duration of the COVID-19 declaration under Section 564(b)(1) of the Act, 21 U.S.C. section 360bbb-3(b)(1), unless the authorization is terminated or revoked sooner.    Influenza A by PCR NEGATIVE NEGATIVE Final   Influenza B by PCR NEGATIVE NEGATIVE Final    Comment: (NOTE) The Xpert Xpress SARS-CoV-2/FLU/RSV assay is intended as an aid in  the diagnosis of influenza from Nasopharyngeal swab specimens and  should not be used as a sole basis for treatment. Nasal washings and  aspirates are unacceptable for Xpert Xpress SARS-CoV-2/FLU/RSV  testing. Fact Sheet for Patients: PinkCheek.be Fact Sheet for Healthcare Providers: GravelBags.it This test is not yet approved or cleared by the [place] FDA and  has been authorized for detection and/or diagnosis of SARS-CoV-2 by  FDA under an Emergency Use Authorization (EUA). This EUA will remain  in effect (meaning this test can be used) for the duration of the  Covid-19 declaration under Section 564(b)(1) of the Act, 21    U.S.C. section 360bbb-3(b)(1), unless the authorization is  terminated or revoked. Performed at [facility], [place] [address], [place], [place] [address]   Urine culture     Status: None  Collection Time: 07/21/19  1:13 PM   Specimen: In/Out Cath Urine  Result Value Ref Range Status   Specimen Description IN/OUT CATH URINE  Final   Special Requests NONE  Final   Culture   Final    NO GROWTH Performed at [facility], [place] [address] [place], [place] [address]    Report Status 07/22/2019 FINAL  Final  MRSA PCR Screening     Status: None   Collection Time: 07/21/19  7:53 PM   Specimen: Nasopharyngeal  Result Value Ref Range Status   MRSA by PCR NEGATIVE NEGATIVE Final    Comment:        The GeneXpert MRSA Assay (FDA approved for NASAL specimens only), is one component of a comprehensive MRSA colonization surveillance program. It is not intended to diagnose MRSA infection nor to guide or monitor treatment for MRSA infections. Performed at [facility], [place] [address], [place], [place] [address]   SARS CORONAVIRUS 2 (TAT 6-24 HRS) Nasopharyngeal Nasopharyngeal Swab     Status: None   Collection Time: 07/25/19  1:37 PM   Specimen: Nasopharyngeal Swab  Result Value Ref Range Status   SARS Coronavirus 2 NEGATIVE NEGATIVE Final    Comment: (NOTE) SARS-CoV-2 target nucleic acids are NOT DETECTED. The SARS-CoV-2 RNA is generally detectable in upper and lower respiratory specimens during the acute phase of infection. Negative results do not preclude SARS-CoV-2 infection, do not rule out co-infections with other pathogens, and should not be used as the sole basis for treatment or other patient management decisions. Negative results must be combined with clinical observations, patient history, and epidemiological information. The expected result is Negative. Fact Sheet for Patients: SugarRoll.be Fact Sheet for Healthcare  Providers: https://www.woods-mathews.com/ This test is not yet approved or cleared by the [place] FDA and  has been authorized for detection and/or diagnosis of SARS-CoV-2 by FDA under an Emergency Use Authorization (EUA). This EUA will remain  in effect (meaning this test can be used) for the duration of the COVID-19 declaration under Section 56 4(b)(1) of the Act, 21 U.S.C. section 360bbb-3(b)(1), unless the authorization is terminated or revoked sooner. Performed at [facility], [place] [address] [place], [place] [address]          Radiology Studies: DG CHEST PORT 1 VIEW  Result Date: 07/25/2019 CLINICAL DATA:  Pleural effusion EXAM: PORTABLE CHEST 1 VIEW COMPARISON:  Jul 21, 2019. FINDINGS: There are bilateral pleural effusions, increased on the left and new on the right. There is atelectatic change in each lung base. Heart size and pulmonary vascularity are normal. No adenopathy. Bones are osteoporotic. IMPRESSION: Pleural effusions bilaterally, increased on the left and new on the right compared to most recent study. No appreciable airspace consolidation. Stable cardiac silhouette. No adenopathy evident. Electronically Signed   By: Lowella Grip III [name]   On: 07/25/2019 13:53        Scheduled Meds: . amiodarone  200 mg Oral BID  . feeding supplement (ENSURE ENLIVE)  237 mL Oral TID BM  . ferrous sulfate  325 mg Oral Q breakfast  . folic acid  1 mg Oral Daily  . levETIRAcetam  1,500 mg Oral BID  . multivitamin with minerals  1 tablet Oral Daily  . pantoprazole  40 mg Oral Daily  . phenytoin  200 mg Oral Daily  . predniSONE  1 mg Oral Q breakfast  . warfarin  5 mg Oral ONCE-1600  . Warfarin - Pharmacist Dosing  Inpatient   Does not apply q1600   Continuous Infusions: . sodium chloride       LOS: 6 days        Hosie Poisson, MD [facility]   To contact the attending provider between 7A-7P or the covering provider during after  hours 7P-7A, please log into the web site [email or web address] and access using universal [place] password for that web site. If you do not have the password, please call the hospital operator.  07/27/2019, 11:38 AM

## 2019-07-30 ENCOUNTER — Encounter: Payer: Self-pay | Admitting: *Deleted

## 2019-07-30 ENCOUNTER — Ambulatory Visit: Payer: Medicare Other | Admitting: Neurology

## 2019-07-30 ENCOUNTER — Other Ambulatory Visit: Payer: Self-pay | Admitting: *Deleted

## 2019-07-30 NOTE — Patient Outreach (Signed)
[facility]) Care Management  07/30/2019  [name] [name] February 29, 1941 [id number]   Referral Date: 5/7 Referral Source: Hospital Liaison Referral Reason: Hospital Discharge Insurance: Next Gen   Outreach attempt #1, successful to daughter, identity verified.  This care manager introduced self and stated purpose of call.  [name] care management services explained.  Social: Daughter report member lives alone but has someone with him almost 24 hours a day.  He has sitter that comes in daily and she or other family take turns spending time with him.  State she initially was concerned about him being at home and was going to take him to her house after discharge but he has proved that he can manage and is more comfortable in his own home.  She report he uses a walker for support but has not had any falls recently to her knowledge.  She does have some concern with his sleeping pattern, gave him Melatonin which seemed to help.    She state he is independent with his ADLs but has been educated not to get in the shower if no one is there with him.  Report the family prepares meals for him but his appetite remains poor.  He does drink Ensure, they encourage him to eat and drink often.  Conditions: Per chart, has history of HTN, stroke, A-fib, cirrhosis, GI bleed, seizure, pneumonia (recent admission), and some dementia.  Medications: Reviewed with daughter, report they have a system where his daily meds are in a pill box and his BID meds are separate where is able to remember to take them in the morning and evening.  Denies need for financial assistance.  She does not have Protonix, will ask MD for refill during next visit.  Appointments: Has follow up appointment scheduled with PCP on 5/14, last seen a month ago.  Also has appointment with A-fib clinic on 5/27.  Daughter provides transportation to all appointments and for errands/groceries.   Daughter denies any urgent concerns at  this time, encouraged to contact with questions.  Plan: RN CM will follow up with daughter within the next 2 weeks.  Fall Risk  07/30/2019 07/28/2018 05/23/2017 11/12/2016 07/15/2016  Falls in the past year? 0 0 Yes No Yes  Number falls in past yr: 0 0 2 or more - 1  Injury with Fall? 0 0 No - Yes  Risk Factor Category  - - High Fall Risk - -  Follow up - - Falls evaluation completed - -   No flowsheet data found. [facility] CM Care Plan Problem One     Most Recent Value  Care Plan Problem One  Risk for readmission related to infection as evidenced by recent admission  Role Documenting the Problem One  Care Management [place] for Problem One  Active  [facility] Long Term Goal   Member will not be readmitted to hospital within the next 31 days  [facility] Long Term Goal Start Date  07/30/19  Interventions for Problem One Long Term Goal  Discharge AVS reviewed with daughter.  Educated on signs/symptoms of further infection. Encouraged to have member deep breath and increase mobility as tolerated to recover from pneumonia  [facility] CM Short Term Goal #1   Member/daughter will report taking all medications as instructed over the next 2 weeks  [facility] CM Short Term Goal #1 Start Date  07/30/19  Interventions for Short Term Goal #1  Reviewed medications with daughter, advised to discuss refills with PCP during visit.  [facility] CM Short Term Goal #2   Daughter will report no weight loss over the next 4 weeks  [facility] CM Short Term Goal #2 Start Date  07/30/19  Interventions for Short Term Goal #2  Discussed with daughter the importance of nutrition, discussed use of Ensure for protein and nutrition supplement     [name], Therapist, sports, MSN [place] [facility] [phone]

## 2019-07-31 DIAGNOSIS — R634 Abnormal weight loss: Secondary | ICD-10-CM | POA: Diagnosis not present

## 2019-07-31 DIAGNOSIS — M353 Polymyalgia rheumatica: Secondary | ICD-10-CM | POA: Diagnosis not present

## 2019-07-31 DIAGNOSIS — I10 Essential (primary) hypertension: Secondary | ICD-10-CM | POA: Diagnosis not present

## 2019-07-31 DIAGNOSIS — K5901 Slow transit constipation: Secondary | ICD-10-CM | POA: Diagnosis not present

## 2019-07-31 DIAGNOSIS — Z79899 Other long term (current) drug therapy: Secondary | ICD-10-CM | POA: Diagnosis not present

## 2019-08-08 ENCOUNTER — Other Ambulatory Visit: Payer: Self-pay | Admitting: *Deleted

## 2019-08-08 NOTE — Patient Outreach (Signed)
[facility]) Care Management  08/08/2019  [name] [facility] [birth year] [id number]   Call received from [facility] daughter, state member has been complaining about feeling "sick" intermittently.  She feels it may be associated with his medications and would like for someone to come out to the home to review medications and assess member.  He has also been complaining of increased urination.  He still lives alone but daughter report he continues to have increased support in the home.    Will place referral to [facility] for home assessment, will follow up with member/daughter within the next week as planned.  [name], [place], MSN [place] [phone]

## 2019-08-13 ENCOUNTER — Ambulatory Visit: Payer: Self-pay | Admitting: *Deleted

## 2019-08-16 ENCOUNTER — Ambulatory Visit (HOSPITAL_COMMUNITY): Payer: Medicare Other | Admitting: Physician Assistant

## 2019-08-17 ENCOUNTER — Other Ambulatory Visit: Payer: Self-pay | Admitting: *Deleted

## 2019-08-17 NOTE — Patient Outreach (Signed)
[facility]) Care Management  08/17/2019  Revis Botz [facility] 1940/03/26 [id number]   Call placed to member's daughter to follow up on [facility] referral, no answer. HIPAA complaint voice message left.  Will follow up within the next 3-4 business days.  [name], [place], MSN [place] [phone]

## 2019-08-21 ENCOUNTER — Ambulatory Visit (INDEPENDENT_AMBULATORY_CARE_PROVIDER_SITE_OTHER): Payer: Medicare Other | Admitting: Pharmacist

## 2019-08-21 ENCOUNTER — Other Ambulatory Visit: Payer: Self-pay

## 2019-08-21 DIAGNOSIS — Z5181 Encounter for therapeutic drug level monitoring: Secondary | ICD-10-CM | POA: Diagnosis not present

## 2019-08-21 DIAGNOSIS — I4892 Unspecified atrial flutter: Secondary | ICD-10-CM | POA: Diagnosis not present

## 2019-08-21 LAB — POCT INR: INR: 2.2 (ref 2.0–3.0)

## 2019-08-21 NOTE — Patient Instructions (Signed)
Description   Continue taking Warfarin 2 tablets daily except for 1 tablet on Mondays and Fridays. Recheck INR in 8 weeks. Pt is still taking prednisone 5 mg daily. Coumadin Clinic. [phone].

## 2019-08-22 ENCOUNTER — Encounter (HOSPITAL_COMMUNITY): Payer: Self-pay | Admitting: Physician Assistant

## 2019-08-22 ENCOUNTER — Ambulatory Visit (HOSPITAL_COMMUNITY)
Admission: RE | Admit: 2019-08-22 | Discharge: 2019-08-22 | Disposition: A | Discharge status: Home / Self Care | Payer: Medicare Other | Source: Ambulatory Visit | Attending: Physician Assistant | Admitting: Physician Assistant

## 2019-08-22 VITALS — BP 118/50 | HR 84 | Ht 73.0 in | Wt 128.4 lb

## 2019-08-22 DIAGNOSIS — I10 Essential (primary) hypertension: Secondary | ICD-10-CM | POA: Diagnosis not present

## 2019-08-22 DIAGNOSIS — R569 Unspecified convulsions: Secondary | ICD-10-CM | POA: Diagnosis not present

## 2019-08-22 DIAGNOSIS — I4819 Other persistent atrial fibrillation: Secondary | ICD-10-CM | POA: Insufficient documentation

## 2019-08-22 DIAGNOSIS — D6869 Other thrombophilia: Secondary | ICD-10-CM | POA: Insufficient documentation

## 2019-08-22 DIAGNOSIS — Z7952 Long term (current) use of systemic steroids: Secondary | ICD-10-CM | POA: Diagnosis not present

## 2019-08-22 DIAGNOSIS — Z8673 Personal history of transient ischemic attack (TIA), and cerebral infarction without residual deficits: Secondary | ICD-10-CM | POA: Insufficient documentation

## 2019-08-22 DIAGNOSIS — Z79899 Other long term (current) drug therapy: Secondary | ICD-10-CM | POA: Diagnosis not present

## 2019-08-22 DIAGNOSIS — Z7901 Long term (current) use of anticoagulants: Secondary | ICD-10-CM | POA: Insufficient documentation

## 2019-08-22 DIAGNOSIS — I4892 Unspecified atrial flutter: Secondary | ICD-10-CM | POA: Diagnosis not present

## 2019-08-22 DIAGNOSIS — F0390 Unspecified dementia without behavioral disturbance: Secondary | ICD-10-CM | POA: Diagnosis not present

## 2019-08-22 DIAGNOSIS — Z87891 Personal history of nicotine dependence: Secondary | ICD-10-CM | POA: Diagnosis not present

## 2019-08-22 MED ORDER — AMIODARONE HCL 200 MG PO TABS: 200.0000 mg | ORAL_TABLET | Freq: Every day | ORAL | 3 refills | Status: DC

## 2019-08-22 NOTE — Patient Instructions (Signed)
Decrease amiodarone to 200mg once a day 

## 2019-08-22 NOTE — Progress Notes (Addendum)
Primary Care Physician: [name], MD Primary Cardiologist: Dr Radford Pax Primary Electrophysiologist: Dr Rayann Heman Referring Physician: Dr Marylynn Pearson Vi Kanan is a 79 y.o. male with a history of persistent atrial fibrillation, atrial flutter, dementia, CVA, HTN, and seizures who presents for consultation in the [facility]. The patient was initially diagnosed with atrial flutter in 2013 after presenting with an acute CVA and seizures. Patient declined ablation for his atrial flutter. Patient is on warfarin for a CHADS2VASC score of 5. He was recently admitted with PNA, enteritis, and sepsis and cardiology was consulted to assist with rate control of his afib. He was started on amiodarone and converted to SR. He reports that he has done reasonably well since his hospitalization. He is a difficult historian. His caregiver reports compliance with medications. He is in SR today.  Today, he denies symptoms of palpitations, chest pain, shortness of breath, orthopnea, PND, lower extremity edema, dizziness, presyncope, syncope, snoring, daytime somnolence, bleeding, or neurologic sequela. The patient is tolerating medications without difficulties and is otherwise without complaint today.   Atrial Fibrillation Risk Factors:  he does not have symptoms or diagnosis of sleep apnea. he does not have a history of rheumatic fever.   he has a BMI of Body mass index is 16.94 kg/m.Marland Kitchen Filed Weights   08/22/19 1424  Weight: 58.2 kg    Family History  Problem Relation Age of Onset  . Hypertension Other      Atrial Fibrillation Management history:  Previous antiarrhythmic drugs: amiodarone Previous cardioversions: none Previous ablations: none CHADS2VASC score: 5 Anticoagulation history: warfarin    Past Medical History:  Diagnosis Date  . Hiatal hernia   . HTN (hypertension)    pt denies h/o HTN though it appears he was started on spironolactone during  his hospitalization 3/13  . Pancreatitis   . Seizures ([place])   . Stroke ([place])   . Typical atrial flutter ([place]) 3/13   Past Surgical History:  Procedure Laterality Date  . ESOPHAGOGASTRODUODENOSCOPY N/A 11/30/2015   Procedure: ESOPHAGOGASTRODUODENOSCOPY (EGD);  Surgeon: Wonda Horner, MD;  Location: [facility] ENDOSCOPY;  Service: Endoscopy;  Laterality: N/A;  . ESOPHAGOGASTRODUODENOSCOPY (EGD) WITH PROPOFOL N/A 07/23/2018   Procedure: ESOPHAGOGASTRODUODENOSCOPY (EGD) WITH PROPOFOL;  Surgeon: Otis Brace, MD;  Location: [facility] ENDOSCOPY;  Service: Gastroenterology;  Laterality: N/A;  . FEMUR IM NAIL Left 12/08/2016  . FEMUR IM NAIL Left 12/07/2016   Procedure: INTRAMEDULLARY (IM) NAIL FEMORAL;  Surgeon: Renette Butters, MD;  Location: [place];  Service: Orthopedics;  Laterality: Left;  . GIVENS CAPSULE STUDY N/A 07/24/2018   Procedure: GIVENS CAPSULE STUDY;  Surgeon: Otis Brace, MD;  Location: [place];  Service: Gastroenterology;  Laterality: N/A;  . HIP SURGERY Left 01/2017  . HOT HEMOSTASIS N/A 07/23/2018   Procedure: HOT HEMOSTASIS (ARGON PLASMA COAGULATION/BICAP);  Surgeon: Otis Brace, MD;  [facility];  Service: Gastroenterology;  Laterality: N/A;  . no surgical history  06/2011    Current Outpatient Medications  Medication Sig Dispense Refill  . amiodarone (PACERONE) 200 MG tablet Take 1 tablet (200 mg total) by mouth daily. 30 tablet 3  . feeding supplement, ENSURE ENLIVE, (ENSURE ENLIVE) LIQD Take 237 mLs by mouth 3 (three) times daily between meals. 237 mL 12  . ferrous sulfate 325 (65 FE) MG tablet Take 1 tablet (325 mg total) by mouth 3 (three) times daily with meals. (Patient taking differently: Take 325 mg by mouth daily with breakfast. ) 60 tablet 0  .  folic acid (FOLVITE) 1 MG tablet Take 1 tablet (1 mg total) by mouth daily. 90 tablet 1  . levETIRAcetam (KEPPRA) 500 MG tablet TAKE 3 TABLETS BY MOUTH TWICE A DAY (Patient taking differently: Take 1,500 mg by  mouth 2 (two) times daily. ) 540 tablet 3  . Multiple Vitamin (MULTIVITAMIN WITH MINERALS) TABS tablet Take 1 tablet by mouth daily.    . pantoprazole (PROTONIX) 40 MG tablet Take 1 tablet (40 mg total) by mouth daily for 30 days. 30 tablet 0  . phenytoin (DILANTIN) 200 MG ER capsule Take 1 capsule (200 mg total) by mouth at bedtime. (Patient taking differently: Take 200 mg by mouth daily. ) 30 capsule 1  . polyethylene glycol (MIRALAX / GLYCOLAX) 17 g packet Take 17 g by mouth daily as needed for mild constipation. 14 each 0  . predniSONE (DELTASONE) 1 MG tablet Take 1 tablet (1 mg total) by mouth daily.    Marland Kitchen warfarin (COUMADIN) 2.5 MG tablet Take 1 tablet (2.5 mg total) by mouth See admin instructions. TAKE AS DIRECTED BY COUMADIN CLINIC.     No current facility-administered medications for this encounter.    No Known Allergies  Social History   Socioeconomic History  . Marital status: Divorced    Spouse name: Not on file  . Number of children: Not on file  . Years of education: Not on file  . Highest education level: Not on file  Occupational History  . Not on file  Tobacco Use  . Smoking status: Former Smoker    Quit date: 07/15/1966    Years since quitting: 53.1  . Smokeless tobacco: Never Used  Substance and Sexual Activity  . Alcohol use: No    Comment: Quit greater than 5 years ago  . Drug use: No  . Sexual activity: Not on file  Other Topics Concern  . Not on file  Social History Narrative   Lives alone.  Retired from [employer]   Social Determinants of Radio broadcast assistant Strain:   . Difficulty of Paying Living Expenses:   Food Insecurity: No Food Insecurity  . Worried About Charity fundraiser in the Last Year: Never true  . Ran Out of Food in the Last Year: Never true  Transportation Needs: No Transportation Needs  . Lack of Transportation (Medical): No  . Lack of Transportation (Non-Medical): No  Physical Activity:   . Days of Exercise per  Week:   . Minutes of Exercise per Session:   Stress:   . Feeling of Stress :   Social Connections:   . Frequency of Communication with Friends and Family:   . Frequency of Social Gatherings with Friends and Family:   . Attends Religious Services:   . Active Member of Clubs or Organizations:   . Attends Archivist Meetings:   Marland Kitchen Marital Status:   Intimate Partner Violence:   . Fear of Current or Ex-Partner:   . Emotionally Abused:   Marland Kitchen Physically Abused:   . Sexually Abused:      ROS- All systems are reviewed and negative except as per the HPI above.  Physical Exam: Vitals:   08/22/19 1424  BP: (!) 118/50  Pulse: 84  Weight: 58.2 kg  Height: 6\' 1"  (1.854 m)    GEN- The patient is a cachetic appearing elderly male, alert and oriented x 3 today.   Head- normocephalic, atraumatic Eyes-  Sclera clear, conjunctiva pink Ears- hearing intact Oropharynx- clear Neck- supple  Lungs- Clear to ausculation bilaterally, normal work of breathing Heart- Regular rate and rhythm, no murmurs, rubs or gallops  GI- soft, NT, ND, + BS Extremities- no clubbing, cyanosis, or edema MS- no significant deformity or atrophy Skin- no rash or lesion Psych- euthymic mood, full affect Neuro- strength and sensation are intact  Wt Readings from Last 3 Encounters:  08/22/19 58.2 kg  07/27/19 66.8 kg  10/20/18 58.4 kg    EKG today demonstrates SR HR 84, RBBB, PR 206, QRS 134, QTc 493  Echo 07/24/19 demonstrated  1. Left ventricular ejection fraction, by estimation, is 60 to 65%. The  left ventricle has normal function. The left ventricle has no regional  wall motion abnormalities. Left ventricular diastolic function could not  be evaluated.  2. Right ventricular systolic function is normal. The right ventricular  size is normal.  3. Right atrial size was mildly dilated.  4. Moderate pleural effusion in both left and right lateral regions.  5. The mitral valve is normal in  structure. Trivial mitral valve  regurgitation. No evidence of mitral stenosis.  6. The aortic valve is grossly normal. Aortic valve regurgitation is not  visualized. No aortic stenosis is present.  7. Aortic dilatation noted. There is mild dilatation of the ascending  aorta measuring 39 mm.  8. The inferior vena cava is normal in size with greater than 50%  respiratory variability, suggesting right atrial pressure of 3 mmHg.   Epic records are reviewed at length today  CHA2DS2-VASc Score = 5  The patient's score is based upon: CHF History: 0 HTN History: 1 Age : 2 Diabetes History: 0 Stroke History: 2 Vascular Disease History: 0 Gender: 0      ASSESSMENT AND PLAN: 1. Persistent Atrial Fibrillation/atrial flutter The patient's CHA2DS2-VASc score is 5, indicating a 7.2% annual risk of stroke.   Patient appears to be maintaining SR.  Decrease amiodarone to 200 mg daily. Check Cmet/TSH at follow up. Continue warfarin Cannot use DOAC due to seizure medications.   2. Secondary Hypercoagulable State (ICD10:  D68.69) The patient is at significant risk for stroke/thromboembolism based upon his CHA2DS2-VASc Score of 5.  Continue Warfarin (Coumadin).   3. HTN Stable, no changes today.   Follow up in the AF clinic in 3 months.    [facility] [address] [place], [place] [address] [phone] 08/22/2019 2:51 PM

## 2019-08-24 ENCOUNTER — Other Ambulatory Visit: Payer: Self-pay | Admitting: *Deleted

## 2019-08-24 NOTE — Patient Outreach (Signed)
[name]) Care Management  08/24/2019  Davaris Youtsey [facility] 15-Aug-1940 [id number]   Call placed to member's daughter to follow up on ongoing recovery from discharge.  She state he is doing a little better, not having as many complaints of feeling sick as previously reported.  He was seen at the A-fib clinic on 6/2, Amiodarone dose was adjusted, will follow up in September.  She report his appetite is still not good, now weighing 128 pounds, state he normal average is about 135 pounds.  He has been drinking Ensure for supplements and family has been trying to encourage some of his favorite foods.  She confirms that she did receive call from [facility] and had visit scheduled but she had to cancel due to death in her family.  Encouraged to reschedule, benefits from home assessment discussed.  She verbalizes understanding.  They still have someone to sit with him during the day but she is also looking to have someone check on him several days a week at night.  Advised that since member does not have medicaid, this may be an out of pocket expense.  She denies any urgent concerns at this time, encouraged to contact this care manager with questions.  Will have CSW send information on personal care assistance and follow up within the next month.  [facility] CM Care Plan Problem One     Most Recent Value  Care Plan Problem One  Risk for readmission related to infection as evidenced by recent admission  Role Documenting the Problem One  Care Management [place] for Problem One  Active  [facility] Long Term Goal   Member will not be readmitted to hospital within the next 31 days  [facility] Long Term Goal Start Date  07/30/19  Interventions for Problem One Long Term Goal  Referral placed to [facility].  Daughter informed and advised to call to reschedule visit.  [facility] CM Short Term Goal #1   Member/daughter will report taking all medications as instructed over the next 2 weeks  [facility] CM Short  Term Goal #1 Start Date  07/30/19  [facility] CM Short Term Goal #1 Met Date  08/24/19  [facility] CM Short Term Goal #2   Daughter will report no weight loss over the next 4 weeks  [facility] CM Short Term Goal #2 Start Date  07/30/19  Interventions for Short Term Goal #2  Daughter educated on importance of nutrition and risk of malnutrition.  Discussed interventions to improve intake and increase appetite     [name], Therapist, sports, MSN [place] [phone]

## 2019-09-04 NOTE — Addendum Note (Signed)
Addended byValente David on: 09/04/2019 11:29 AM   Modules accepted: Orders

## 2019-09-09 ENCOUNTER — Other Ambulatory Visit: Payer: Self-pay

## 2019-09-09 ENCOUNTER — Encounter (HOSPITAL_COMMUNITY): Payer: Self-pay

## 2019-09-09 ENCOUNTER — Inpatient Hospital Stay (HOSPITAL_COMMUNITY)
Admission: EM | Admit: 2019-09-09 | Discharge: 2019-09-13 | DRG: 356 | Disposition: A | Discharge status: Home Health | Payer: Medicare Other | Attending: Internal Medicine | Admitting: Internal Medicine

## 2019-09-09 ENCOUNTER — Emergency Department (HOSPITAL_COMMUNITY): Payer: Medicare Other

## 2019-09-09 DIAGNOSIS — E876 Hypokalemia: Secondary | ICD-10-CM | POA: Diagnosis not present

## 2019-09-09 DIAGNOSIS — R000 Tachycardia, unspecified: Secondary | ICD-10-CM | POA: Diagnosis not present

## 2019-09-09 DIAGNOSIS — E43 Unspecified severe protein-calorie malnutrition: Secondary | ICD-10-CM | POA: Diagnosis present

## 2019-09-09 DIAGNOSIS — R739 Hyperglycemia, unspecified: Secondary | ICD-10-CM | POA: Diagnosis present

## 2019-09-09 DIAGNOSIS — Z681 Body mass index (BMI) 19 or less, adult: Secondary | ICD-10-CM

## 2019-09-09 DIAGNOSIS — D62 Acute posthemorrhagic anemia: Secondary | ICD-10-CM | POA: Diagnosis present

## 2019-09-09 DIAGNOSIS — F0390 Unspecified dementia without behavioral disturbance: Secondary | ICD-10-CM | POA: Diagnosis present

## 2019-09-09 DIAGNOSIS — Z87891 Personal history of nicotine dependence: Secondary | ICD-10-CM

## 2019-09-09 DIAGNOSIS — Z8701 Personal history of pneumonia (recurrent): Secondary | ICD-10-CM | POA: Diagnosis not present

## 2019-09-09 DIAGNOSIS — I4510 Unspecified right bundle-branch block: Secondary | ICD-10-CM | POA: Diagnosis present

## 2019-09-09 DIAGNOSIS — I480 Paroxysmal atrial fibrillation: Secondary | ICD-10-CM | POA: Diagnosis present

## 2019-09-09 DIAGNOSIS — Z8673 Personal history of transient ischemic attack (TIA), and cerebral infarction without residual deficits: Secondary | ICD-10-CM | POA: Diagnosis not present

## 2019-09-09 DIAGNOSIS — D72829 Elevated white blood cell count, unspecified: Secondary | ICD-10-CM | POA: Diagnosis present

## 2019-09-09 DIAGNOSIS — K219 Gastro-esophageal reflux disease without esophagitis: Secondary | ICD-10-CM | POA: Diagnosis present

## 2019-09-09 DIAGNOSIS — G40909 Epilepsy, unspecified, not intractable, without status epilepticus: Secondary | ICD-10-CM

## 2019-09-09 DIAGNOSIS — D649 Anemia, unspecified: Secondary | ICD-10-CM

## 2019-09-09 DIAGNOSIS — K922 Gastrointestinal hemorrhage, unspecified: Secondary | ICD-10-CM | POA: Diagnosis present

## 2019-09-09 DIAGNOSIS — D6869 Other thrombophilia: Secondary | ICD-10-CM | POA: Diagnosis present

## 2019-09-09 DIAGNOSIS — K5900 Constipation, unspecified: Secondary | ICD-10-CM | POA: Diagnosis present

## 2019-09-09 DIAGNOSIS — D509 Iron deficiency anemia, unspecified: Secondary | ICD-10-CM | POA: Diagnosis not present

## 2019-09-09 DIAGNOSIS — R531 Weakness: Secondary | ICD-10-CM | POA: Diagnosis not present

## 2019-09-09 DIAGNOSIS — K31819 Angiodysplasia of stomach and duodenum without bleeding: Secondary | ICD-10-CM | POA: Diagnosis not present

## 2019-09-09 DIAGNOSIS — I10 Essential (primary) hypertension: Secondary | ICD-10-CM | POA: Diagnosis present

## 2019-09-09 DIAGNOSIS — E860 Dehydration: Secondary | ICD-10-CM | POA: Diagnosis present

## 2019-09-09 DIAGNOSIS — Z79899 Other long term (current) drug therapy: Secondary | ICD-10-CM

## 2019-09-09 DIAGNOSIS — Z8249 Family history of ischemic heart disease and other diseases of the circulatory system: Secondary | ICD-10-CM

## 2019-09-09 DIAGNOSIS — Z20822 Contact with and (suspected) exposure to covid-19: Secondary | ICD-10-CM | POA: Diagnosis present

## 2019-09-09 DIAGNOSIS — K31811 Angiodysplasia of stomach and duodenum with bleeding: Secondary | ICD-10-CM | POA: Diagnosis present

## 2019-09-09 DIAGNOSIS — R195 Other fecal abnormalities: Secondary | ICD-10-CM | POA: Diagnosis not present

## 2019-09-09 DIAGNOSIS — Z7901 Long term (current) use of anticoagulants: Secondary | ICD-10-CM

## 2019-09-09 DIAGNOSIS — I4892 Unspecified atrial flutter: Secondary | ICD-10-CM | POA: Diagnosis present

## 2019-09-09 LAB — TROPONIN I (HIGH SENSITIVITY)
Troponin I (High Sensitivity): 4 ng/L (ref <18)
Troponin I (High Sensitivity): 4 ng/L (ref <18)

## 2019-09-09 LAB — URINALYSIS, ROUTINE W REFLEX MICROSCOPIC
Bilirubin Urine: NEGATIVE
Glucose, UA: NEGATIVE mg/dL
Hgb urine dipstick: NEGATIVE
Ketones, ur: NEGATIVE mg/dL
Leukocytes,Ua: NEGATIVE
Nitrite: NEGATIVE
Protein, ur: NEGATIVE mg/dL
Specific Gravity, Urine: 1.021 (ref 1.005–1.030)
pH: 5 (ref 5.0–8.0)

## 2019-09-09 LAB — BASIC METABOLIC PANEL
Anion gap: 9 (ref 5–15)
BUN: 29 mg/dL — ABNORMAL HIGH (ref 8–23)
CO2: 23 mmol/L (ref 22–32)
Calcium: 8.3 mg/dL — ABNORMAL LOW (ref 8.9–10.3)
Chloride: 106 mmol/L (ref 98–111)
Creatinine, Ser: 0.98 mg/dL (ref 0.61–1.24)
GFR calc Af Amer: >60 mL/min (ref >60)
GFR calc non Af Amer: >60 mL/min (ref >60)
Glucose, Bld: 193 mg/dL — ABNORMAL HIGH (ref 70–99)
Potassium: 3.7 mmol/L (ref 3.5–5.1)
Sodium: 138 mmol/L (ref 135–145)

## 2019-09-09 LAB — PROTIME-INR
INR: 9.3 (ref 0.8–1.2)
Prothrombin Time: 73 seconds — ABNORMAL HIGH (ref 11.4–15.2)

## 2019-09-09 LAB — HEPATIC FUNCTION PANEL
ALT: 17 U/L (ref 0–44)
AST: 23 U/L (ref 15–41)
Albumin: 3.2 g/dL — ABNORMAL LOW (ref 3.5–5.0)
Alkaline Phosphatase: 54 U/L (ref 38–126)
Bilirubin, Direct: <0.1 mg/dL (ref 0.0–0.2)
Total Bilirubin: 0.5 mg/dL (ref 0.3–1.2)
Total Protein: 6 g/dL — ABNORMAL LOW (ref 6.5–8.1)

## 2019-09-09 LAB — CBC WITH DIFFERENTIAL/PLATELET
Abs Immature Granulocytes: 0.29 10*3/uL — ABNORMAL HIGH (ref 0.00–0.07)
Basophils Absolute: 0.1 10*3/uL (ref 0.0–0.1)
Basophils Relative: 1 %
Eosinophils Absolute: 0 10*3/uL (ref 0.0–0.5)
Eosinophils Relative: 0 %
HCT: 16.9 % — ABNORMAL LOW (ref 39.0–52.0)
Hemoglobin: 4.9 g/dL — CL (ref 13.0–17.0)
Immature Granulocytes: 2 %
Lymphocytes Relative: 10 %
Lymphs Abs: 1.2 10*3/uL (ref 0.7–4.0)
MCH: 29 pg (ref 26.0–34.0)
MCHC: 29 g/dL — ABNORMAL LOW (ref 30.0–36.0)
MCV: 100 fL (ref 80.0–100.0)
Monocytes Absolute: 1.5 10*3/uL — ABNORMAL HIGH (ref 0.1–1.0)
Monocytes Relative: 12 %
Neutro Abs: 9.2 10*3/uL — ABNORMAL HIGH (ref 1.7–7.7)
Neutrophils Relative %: 75 %
Platelets: 476 10*3/uL — ABNORMAL HIGH (ref 150–400)
RBC: 1.69 MIL/uL — ABNORMAL LOW (ref 4.22–5.81)
RDW: 24.5 % — ABNORMAL HIGH (ref 11.5–15.5)
WBC: 12.3 10*3/uL — ABNORMAL HIGH (ref 4.0–10.5)
nRBC: 2.9 % — ABNORMAL HIGH (ref 0.0–0.2)

## 2019-09-09 LAB — PREPARE RBC (CROSSMATCH)

## 2019-09-09 LAB — POC OCCULT BLOOD, ED: Fecal Occult Bld: POSITIVE — AB

## 2019-09-09 LAB — APTT: aPTT: 78 seconds — ABNORMAL HIGH (ref 24–36)

## 2019-09-09 LAB — LACTIC ACID, PLASMA: Lactic Acid, Venous: 1.9 mmol/L (ref 0.5–1.9)

## 2019-09-09 LAB — SARS CORONAVIRUS 2 BY RT PCR (HOSPITAL ORDER, PERFORMED IN ~~LOC~~ HOSPITAL LAB): SARS Coronavirus 2: NEGATIVE

## 2019-09-09 LAB — PHENYTOIN LEVEL, TOTAL: Phenytoin Lvl: 9 ug/mL — ABNORMAL LOW (ref 10.0–20.0)

## 2019-09-09 LAB — CBG MONITORING, ED: Glucose-Capillary: 182 mg/dL — ABNORMAL HIGH (ref 70–99)

## 2019-09-09 MED ORDER — FOLIC ACID 1 MG PO TABS: 1.0000 mg | ORAL_TABLET | Freq: Every day | ORAL | Status: DC

## 2019-09-09 MED ORDER — PHENYTOIN SODIUM EXTENDED 100 MG PO CAPS
200.0000 mg | ORAL_CAPSULE | Freq: Every day | ORAL | Status: DC
  Administered 2019-09-10 – 2019-09-13 (×4): 200 mg via ORAL
  Filled 2019-09-09 (×4): qty 2

## 2019-09-09 MED ORDER — SODIUM CHLORIDE 0.9 % IV SOLN: INTRAVENOUS | Status: DC

## 2019-09-09 MED ORDER — LEVETIRACETAM 500 MG PO TABS: 1500.0000 mg | ORAL_TABLET | Freq: Two times a day (BID) | ORAL | Status: DC

## 2019-09-09 MED ORDER — AMIODARONE HCL 200 MG PO TABS: 200.0000 mg | ORAL_TABLET | Freq: Every day | ORAL | Status: DC

## 2019-09-09 MED ORDER — SODIUM CHLORIDE 0.9 % IV SOLN
80.0000 mg | Freq: Once | INTRAVENOUS | Status: AC
  Administered 2019-09-09: 80 mg via INTRAVENOUS
  Filled 2019-09-09: qty 80

## 2019-09-09 MED ORDER — SODIUM CHLORIDE 0.9% FLUSH: 3.0000 mL | Freq: Once | INTRAVENOUS | Status: DC

## 2019-09-09 MED ORDER — LACTATED RINGERS IV BOLUS
1000.0000 mL | Freq: Once | INTRAVENOUS | Status: AC
  Administered 2019-09-09: 1000 mL via INTRAVENOUS

## 2019-09-09 MED ORDER — VITAMIN K1 10 MG/ML IJ SOLN
10.0000 mg | Freq: Once | INTRAVENOUS | Status: AC
  Administered 2019-09-09: 10 mg via INTRAVENOUS
  Filled 2019-09-09: qty 1

## 2019-09-09 MED ORDER — AMIODARONE HCL 200 MG PO TABS
200.0000 mg | ORAL_TABLET | Freq: Every day | ORAL | Status: DC
  Administered 2019-09-10 – 2019-09-13 (×4): 200 mg via ORAL
  Filled 2019-09-09 (×4): qty 1

## 2019-09-09 MED ORDER — LEVETIRACETAM IN NACL 1500 MG/100ML IV SOLN
1500.0000 mg | Freq: Two times a day (BID) | INTRAVENOUS | Status: DC
  Administered 2019-09-09 – 2019-09-13 (×8): 1500 mg via INTRAVENOUS
  Filled 2019-09-09 (×9): qty 100

## 2019-09-09 MED ORDER — SODIUM CHLORIDE 0.9 % IV SOLN
10.0000 mL/h | Freq: Once | INTRAVENOUS | Status: AC
  Administered 2019-09-09: 10 mL/h via INTRAVENOUS

## 2019-09-09 MED ORDER — FOLIC ACID 5 MG/ML IJ SOLN
1.0000 mg | Freq: Every day | INTRAMUSCULAR | Status: DC
  Administered 2019-09-09 – 2019-09-12 (×4): 1 mg via INTRAVENOUS
  Filled 2019-09-09 (×6): qty 0.2

## 2019-09-09 NOTE — ED Provider Notes (Addendum)
Patient seen after prior ED provider.  Is presenting for evaluation of gradually progressive weakness.  Work-up reveals evidence of elevated INR and worsening anemia.  Patient is guaiac positive with melanotic stool.  Patient will be transfused and admitted for further work-up of his GI bleed.  Vitamin K ordered for reversal of his coagulopathy.  Hospitalist service is aware of case.  [facility] - Dr Therisa Doyne - is aware of the case and will consult.      Valarie Merino, MD 09/09/19 1653    Valarie Merino, MD 09/09/19 [phone]

## 2019-09-09 NOTE — ED Provider Notes (Signed)
[place] EMERGENCY DEPARTMENT Provider Note   CSN: [id number] Arrival date & time: 09/09/19  1333     History Chief Complaint  Patient presents with  . Weakness    [name] is a 79 y.o. male.  HPI      79 year old male with a history of hypertension, seizures, CVA, atrial flutter, atrial fibrillation, dementia, presents with concern for low appetite, progressive generalized weakness over the last week and palpitations.  Patient and daughter report that he has had a low appetite over the last 3 weeks, has not wanted to eat or drink much.  He lives alone.  Reports he has had no bowel movements, but has not been eating enough to have bowel movements.  Denies black or bloody stools, nausea, vomiting, abdominal pain, chest pain, shortness of breath, cough, fevers, urinary symptoms.  Reports severe weakness, and even with just walking to the bathroom, feels his heart racing.    Past Medical History:  Diagnosis Date  . Hiatal hernia   . HTN (hypertension)    pt denies h/o HTN though it appears he was started on spironolactone during his hospitalization 3/13  . Pancreatitis   . Seizures ([place])   . Stroke ([place])   . Typical atrial flutter ([place]) 3/13    Patient Active Problem List   Diagnosis Date Noted  . Persistent atrial fibrillation ([place]) 08/22/2019  . Secondary hypercoagulable state ([place]) 08/22/2019  . Sepsis ([place]) 07/21/2019  . Total bilirubin, elevated   . Dilantin toxicity, accidental or unintentional, initial encounter 10/20/2018  . Dilantin overdose, accidental or unintentional, initial encounter 10/20/2018  . GIB (gastrointestinal bleeding) 07/22/2018  . Current chronic use of systemic steroids 01/11/2017  . RBBB 01/11/2017  . Polymyalgia ([place]) 01/11/2017  . Fever 01/07/2017  . GI bleeding 01/06/2017  . History of CVA in adulthood 01/06/2017  . Retroperitoneal hematoma 01/06/2017  . Pressure injury of skin 12/13/2016  . Fall   .  Closed fracture of femur, intertrochanteric, left, initial encounter ([place]) 12/06/2016  . Severe protein-calorie malnutrition ([place]) 04/03/2016  . Hypotension 04/01/2016  . Leukopenia 03/31/2016  . Elevated INR 03/31/2016  . Hypokalemia 03/31/2016  . Acute pain of right knee 03/01/2016  . CAP (community acquired pneumonia) 12/01/2015  . Aspiration pneumonia ([place]) 12/01/2015  . Erosive gastropathy: Per EGD 11/30/2015 11/30/2015  . Normochromic anemia 11/28/2015  . Localization-related symptomatic epilepsy and epileptic syndromes with complex partial seizures, not intractable, without status epilepticus ([place]) 07/07/2015  . Closed Colles' fracture of right radius with routine healing 05/02/2015  . Pain 04/18/2015  . Dementia (AD) 11/16/2012  . Chronic anticoagulation 06/30/2011  . Paroxysmal atrial flutter ([place]) 06/04/2011  . 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 SINUSITIS, CHRONIC NEC 12/14/2006  . ABUSE, ALCOHOL, CONTINUOUS 10/11/2006  . History of cardiovascular disorder 10/11/2006  . Essential hypertension 09/15/2006  . Stroke ([place]) 09/15/2006  . ALLERGIC RHINITIS 09/15/2006  . CIRRHOSIS, ALCOHOLIC, LIVER 97/67/3419  . Seizure disorder ([place]) 09/15/2006  . PANCREATITIS, HX OF 09/15/2006    Past Surgical History:  Procedure Laterality Date  . ESOPHAGOGASTRODUODENOSCOPY N/A 11/30/2015   Procedure: ESOPHAGOGASTRODUODENOSCOPY (EGD);  Surgeon: Wonda Horner, MD;  Location: [facility] ENDOSCOPY;  Service: Endoscopy;  Laterality: N/A;  . ESOPHAGOGASTRODUODENOSCOPY (EGD) WITH PROPOFOL N/A 07/23/2018   Procedure: ESOPHAGOGASTRODUODENOSCOPY (EGD) WITH PROPOFOL;  Surgeon: Otis Brace, MD;  Location: [facility] ENDOSCOPY;  Service: Gastroenterology;  Laterality: N/A;  . FEMUR IM NAIL Left 12/08/2016  . FEMUR IM NAIL Left 12/07/2016   Procedure: INTRAMEDULLARY (IM)  NAIL FEMORAL;  Surgeon: Renette Butters, MD;  Location: [place];  Service: Orthopedics;  Laterality: Left;  . GIVENS CAPSULE STUDY N/A  07/24/2018   Procedure: GIVENS CAPSULE STUDY;  Surgeon: Otis Brace, MD;  Location: [place];  Service: Gastroenterology;  Laterality: N/A;  . HIP SURGERY Left 01/2017  . HOT HEMOSTASIS N/A 07/23/2018   Procedure: HOT HEMOSTASIS (ARGON PLASMA COAGULATION/BICAP);  Surgeon: Otis Brace, MD;  [facility] END[facility]COPY;  Service: Gastroenterology;  Laterality: N/A;  . no surgical history  06/2011       Family History  Problem Relation Age of Onset  . Hypertension Other     Social History   Tobacco Use  . Smoking status: Former Smoker    Quit date: 07/15/1966    Years since quitting: 53.1  . Smokeless tobacco: Never Used  Vaping Use  . Vaping Use: Never used  Substance Use Topics  . Alcohol use: No    Comment: Quit greater than 5 years ago  . Drug use: No    Home Medications Prior to Admission medications   Medication Sig Start Date End Date Taking? Authorizing Provider  amiodarone (PACERONE) 200 MG tablet Take 1 tablet (200 mg total) by mouth daily. 08/22/19   Fenton, Clint R, PA  feeding supplement, ENSURE ENLIVE, (ENSURE ENLIVE) LIQD Take 237 mLs by mouth 3 (three) times daily between meals. 07/26/19   Hosie Poisson, MD  ferrous sulfate 325 (65 FE) MG tablet Take 1 tablet (325 mg total) by mouth 3 (three) times daily with meals. Patient taking differently: Take 325 mg by mouth daily with breakfast.  12/15/16   Velvet Bathe, MD  folic acid (FOLVITE) 1 MG tablet Take 1 tablet (1 mg total) by mouth daily. 10/25/18   Mercy Riding, MD  levETIRAcetam (KEPPRA) 500 MG tablet TAKE 3 TABLETS BY MOUTH TWICE A DAY Patient taking differently: Take 1,500 mg by mouth 2 (two) times daily.  07/28/18   Cameron Sprang, MD  Multiple Vitamin (MULTIVITAMIN WITH MINERALS) TABS tablet Take 1 tablet by mouth daily. 07/26/19   Hosie Poisson, MD  pantoprazole (PROTONIX) 40 MG tablet Take 1 tablet (40 mg total) by mouth daily for 30 days. 07/26/18 08/22/19  Arrien, Jimmy Picket, MD  phenytoin (DILANTIN)  200 MG ER capsule Take 1 capsule (200 mg total) by mouth at bedtime. Patient taking differently: Take 200 mg by mouth daily.  10/24/18   Mercy Riding, MD  polyethylene glycol (MIRALAX / GLYCOLAX) 17 g packet Take 17 g by mouth daily as needed for mild constipation. 10/24/18   Mercy Riding, MD  predniSONE (DELTASONE) 1 MG tablet Take 1 tablet (1 mg total) by mouth daily. 07/26/19   Hosie Poisson, MD  warfarin (COUMADIN) 2.5 MG tablet Take 1 tablet (2.5 mg total) by mouth See admin instructions. TAKE AS DIRECTED BY COUMADIN CLINIC. 07/28/19   Hosie Poisson, MD    Allergies    Patient has no known allergies.  Review of Systems   Review of Systems  Constitutional: Positive for appetite change, fatigue and unexpected weight change. Negative for fever.  HENT: Negative for congestion and sore throat.   Eyes: Negative for visual disturbance.  Respiratory: Negative for shortness of breath.   Cardiovascular: Positive for palpitations. Negative for chest pain.  Gastrointestinal: Positive for constipation. Negative for abdominal pain, nausea and vomiting.  Genitourinary: Negative for difficulty urinating.  Musculoskeletal: Negative for back pain and neck stiffness.  Skin: Negative for rash.  Neurological: Positive for light-headedness.  Negative for syncope and headaches.    Physical Exam Updated Vital Signs BP (!) 94/53 (BP Location: Right Arm)   Pulse (!) 106   Temp 98.9 F (37.2 C) (Oral)   Resp 14   Ht 6\' 1"  (1.854 m)   Wt 61.2 kg   SpO2 100%   BMI 17.81 kg/m   Physical Exam Vitals and nursing note reviewed.  Constitutional:      General: He is not in acute distress.    Appearance: He is well-developed. He is cachectic. He is not diaphoretic.  HENT:     Head: Normocephalic and atraumatic.  Eyes:     Conjunctiva/sclera: Conjunctivae normal.  Cardiovascular:     Rate and Rhythm: Regular rhythm. Tachycardia present.     Heart sounds: Normal heart sounds. No murmur heard.  No friction  rub. No gallop.   Pulmonary:     Effort: Pulmonary effort is normal. No respiratory distress.     Breath sounds: Normal breath sounds. No wheezing or rales.  Abdominal:     General: There is no distension.     Palpations: Abdomen is soft.     Tenderness: There is no abdominal tenderness. There is no guarding.  Musculoskeletal:     Cervical back: Normal range of motion.  Skin:    General: Skin is warm and dry.  Neurological:     Mental Status: He is alert and oriented to person, place, and time.     ED Results / Procedures / Treatments   Labs (all labs ordered are listed, but only abnormal results are displayed) Labs Reviewed  CBG MONITORING, ED - Abnormal; Notable for the following components:      Result Value   Glucose-Capillary 182 (*)    All other components within normal limits  CULTURE, BLOOD (ROUTINE X 2)  CULTURE, BLOOD (ROUTINE X 2)  URINE CULTURE  BASIC METABOLIC PANEL  CBC  URINALYSIS, ROUTINE W REFLEX MICROSCOPIC  APTT  HEPATIC FUNCTION PANEL  DIFFERENTIAL  LACTIC ACID, PLASMA  LACTIC ACID, PLASMA  PROTIME-INR  PHENYTOIN LEVEL, TOTAL  TYPE AND SCREEN    EKG EKG Interpretation  Date/Time:  Sunday September 09 2019 13:41:46 EDT Ventricular Rate:  106 PR Interval:  212 QRS Duration: 130 QT Interval:  396 QTC Calculation: 526 R Axis:   84 Text Interpretation: Sinus tachycardia with 1st degree A-V block Right bundle branch block Abnormal ECG Artifact, however suspect sinus rhythm Confirmed by ,  ([id number]) on 09/09/2019 2:41:24 PM   Radiology No results found.  Procedures Procedures (including critical care time)  Medications Ordered in ED Medications  sodium chloride flush (NS) 0.9 % injection 3 mL (has no administration in time range)  lactated ringers bolus 1,000 mL (1,000 mLs Intravenous New Bag/Given 09/09/19 1436)    ED Course  I have reviewed the triage vital signs and the nursing notes.  Pertinent labs & imaging results that were  available during my care of the patient were reviewed by me and considered in my medical decision making (see chart for details).    MDM Rules/Calculators/A&P                          79  year old male with a history of hypertension, seizures, CVA, atrial flutter, atrial fibrillation, dementia, presents with concern for low appetite, progressive generalized weakness over the last week and palpitations.  Differential diagnosis includes anemia, electrolyte abnormality, cardiac abnormality/atrial flutter, infection, other toxic/metabolic abnormalities.  Will repeat ECG-appears to  be sinus but is limited by artifact and atrial flutter with 1-1 is still on differential.    Suspect dehydration given low po intake at home driving hypotension and tachycardia on arrival.  Given IV fluids.  Labs pending at time of transfer of care.      Final Clinical Impression(s) / ED Diagnoses Final diagnoses:  Generalized weakness  Tachycardia    Rx / DC Orders ED Discharge Orders    None       Gareth Morgan, MD 09/09/19 1510

## 2019-09-09 NOTE — ED Triage Notes (Signed)
Per daughter pt with weakness x1 week, increase fatigue with activity, poor appetite, not drinking a lot of fluids and rapid heart beat with activity. Daughter is also concerned for anemia

## 2019-09-09 NOTE — H&P (Addendum)
History and Physical    [name] YTK:[id number] DOB: [birth year] DOA: 09/09/2019  PCP: [name], MD  Patient coming from: Home  I have personally briefly reviewed patient's old medical records in [place]  Chief Complaint: Generalized weakness, loss of appetite, palpitations and feeling of cold since 2 to 3 weeks.  HPI: [name] is a 79 y.o. male with medical history significant of essential hypertension, history of seizure disorder, stroke, paroxysmal atrial fib flutter/fibrillation, GERD was brought in by her daughter for weakness associated with some palpitations and constant feeling of cold and chills.  Patient lives by himself and he reports that he has been feeling cold the last 2 weeks, reports constipation denies any rectal bleed or melena.  Patient denies any nausea or vomiting, chest pain, fever shortness of breath, cough, headache, dizziness, syncopal episodes or any dysuria.  ED Course: On arrival to ED lab work revealed WBC count of 12.3, hemoglobin of 4.9, hematocrit of 16.9, platelets of 476, NR of 9.3, PT of 73, phenytoin of 9.  His stool for occult blood was positive.  COVID-19 screening test is negative.  EKG shows sinus tachycardia. Chest x-ray-showed retrocardiac opacity. 2 units of PRBC transfusion ordered and 1 dose of IV vitamin K 10 mg given. GI consulted. And he was referred to [name] for admission for evaluation of anemia and GI bleed Review of Systems: As per HPI otherwise "All others reviewed and are negative,"   Past Medical History:  Diagnosis Date  . Hiatal hernia   . HTN (hypertension)    pt denies h/o HTN though it appears he was started on spironolactone during his hospitalization 3/13  . Pancreatitis   . Seizures ([place])   . Stroke ([place])   . Typical atrial flutter ([place]) 3/13    Past Surgical History:  Procedure Laterality Date  . ESOPHAGOGASTRODUODENOSCOPY N/A 11/30/2015   Procedure: ESOPHAGOGASTRODUODENOSCOPY (EGD);   Surgeon: Wonda Horner, MD;  Location: [facility] ENDOSCOPY;  Service: Endoscopy;  Laterality: N/A;  . ESOPHAGOGASTRODUODENOSCOPY (EGD) WITH PROPOFOL N/A 07/23/2018   Procedure: ESOPHAGOGASTRODUODENOSCOPY (EGD) WITH PROPOFOL;  Surgeon: Otis Brace, MD;  Location: [facility] ENDOSCOPY;  Service: Gastroenterology;  Laterality: N/A;  . FEMUR IM NAIL Left 12/08/2016  . FEMUR IM NAIL Left 12/07/2016   Procedure: INTRAMEDULLARY (IM) NAIL FEMORAL;  Surgeon: Renette Butters, MD;  Location: [place];  Service: Orthopedics;  Laterality: Left;  . GIVENS CAPSULE STUDY N/A 07/24/2018   Procedure: GIVENS CAPSULE STUDY;  Surgeon: Otis Brace, MD;  Location: [place];  Service: Gastroenterology;  Laterality: N/A;  . HIP SURGERY Left 01/2017  . HOT HEMOSTASIS N/A 07/23/2018   Procedure: HOT HEMOSTASIS (ARGON PLASMA COAGULATION/BICAP);  Surgeon: Otis Brace, MD;  Location: [facility] ENDOSCOPY;  Service: Gastroenterology;  Laterality: N/A;  . no surgical history  06/2011    Social History  reports that he quit smoking about 53 years ago. He has never used smokeless tobacco. He reports that he does not drink alcohol and does not use drugs.  No Known Allergies  Family History  Problem Relation Age of Onset  . Hypertension Other    Family history reviewed, no family history of GI bleeding.   Prior to Admission medications   Medication Sig Start Date End Date Taking? Authorizing Provider  amiodarone (PACERONE) 200 MG tablet Take 1 tablet (200 mg total) by mouth daily. 08/22/19   Fenton, Clint R, PA  feeding supplement, ENSURE ENLIVE, (ENSURE ENLIVE) LIQD Take 237 mLs by mouth 3 (three) times daily  between meals. 07/26/19   Hosie Poisson, MD  ferrous sulfate 325 (65 FE) MG tablet Take 1 tablet (325 mg total) by mouth 3 (three) times daily with meals. Patient taking differently: Take 325 mg by mouth daily with breakfast.  12/15/16   Velvet Bathe, MD  folic acid (FOLVITE) 1 MG tablet Take 1 tablet (1 mg total) by mouth  daily. 10/25/18   Mercy Riding, MD  levETIRAcetam (KEPPRA) 500 MG tablet TAKE 3 TABLETS BY MOUTH TWICE A DAY Patient taking differently: Take 1,500 mg by mouth 2 (two) times daily.  07/28/18   Cameron Sprang, MD  Multiple Vitamin (MULTIVITAMIN WITH MINERALS) TABS tablet Take 1 tablet by mouth daily. 07/26/19   Hosie Poisson, MD  pantoprazole (PROTONIX) 40 MG tablet Take 1 tablet (40 mg total) by mouth daily for 30 days. 07/26/18 08/22/19  Arrien, Jimmy Picket, MD  phenytoin (DILANTIN) 200 MG ER capsule Take 1 capsule (200 mg total) by mouth at bedtime. Patient taking differently: Take 200 mg by mouth daily.  10/24/18   Mercy Riding, MD  polyethylene glycol (MIRALAX / GLYCOLAX) 17 g packet Take 17 g by mouth daily as needed for mild constipation. 10/24/18   Mercy Riding, MD  predniSONE (DELTASONE) 1 MG tablet Take 1 tablet (1 mg total) by mouth daily. 07/26/19   Hosie Poisson, MD  warfarin (COUMADIN) 2.5 MG tablet Take 1 tablet (2.5 mg total) by mouth See admin instructions. TAKE AS DIRECTED BY COUMADIN CLINIC. 07/28/19   Hosie Poisson, MD     Constitutional: NAD, calm, comfortable, not in distress.  Vitals:   09/09/19 1730 09/09/19 1740 09/09/19 1743 09/09/19 1800  BP: (!) 105/57   (!) 101/58  Pulse: 84 87  85  Resp: 20 20  17   Temp:   98.9 F (37.2 C)   TempSrc:   Oral   SpO2: 97% 98%  98%  Weight:      Height:       Eyes: PERRL, lids and conjunctivae normal ENMT: Mucous membranes are  Dry.  Neck: normal, supple,  Respiratory: clear to auscultation bilaterally, no wheezing, no crackles. Normal respiratory effort. No accessory muscle use.  Cardiovascular: Regular rate and rhythm, no murmurs / rubs / gallops. No extremity edema.  Abdomen: no tenderness, no masses palpated. No hepatosplenomegaly. Bowel sounds positive.  Musculoskeletal: no clubbing / cyanosis.  Skin: no rashes, lesions, ulcers. No induration Neurologic: CN 2-12 grossly intact. Sensation intact,  Psychiatric: Normal judgment and  insight. Alert and oriented x 3. Normal mood.    Labs on Admission: I have personally reviewed following labs and imaging studies  CBC: Recent Labs  Lab 09/09/19 1410  WBC 12.3*  NEUTROABS 9.2*  HGB 4.9*  HCT 16.9*  MCV 100.0  PLT 476*    Basic Metabolic Panel: Recent Labs  Lab 09/09/19 1410  NA 138  K 3.7  CL 106  CO2 23  GLUCOSE 193*  BUN 29*  CREATININE 0.98  CALCIUM 8.3*    GFR: Estimated Creatinine Clearance: 52 mL/min (by C-G formula based on SCr of 0.98 mg/dL).  Liver Function Tests: Recent Labs  Lab 09/09/19 1410  AST 23  ALT 17  ALKPHOS 54  BILITOT 0.5  PROT 6.0*  ALBUMIN 3.2*    Urine analysis:    Component Value Date/Time   COLORURINE YELLOW 07/21/2019 1230   APPEARANCEUR CLEAR 07/21/2019 1230   LABSPEC 1.017 07/21/2019 1230   PHURINE 5.0 07/21/2019 1230   [place] 07/21/2019 1230  HGBUR LARGE (A) 07/21/2019 1230   BILIRUBINUR NEGATIVE 07/21/2019 1230   KETONESUR 20 (A) 07/21/2019 1230   PROTEINUR 30 (A) 07/21/2019 1230   UROBILINOGEN 0.2 06/02/2011 0300   NITRITE NEGATIVE 07/21/2019 1230   LEUKOCYTESUR NEGATIVE 07/21/2019 1230    Radiological Exams on Admission: DG Chest Portable 1 View  Result Date: 09/09/2019 CLINICAL DATA:  Palpitations with weakness and fatigue. EXAM: PORTABLE CHEST 1 VIEW COMPARISON:  07/25/2019 FINDINGS: Lungs are somewhat hyperexpanded with subtle hazy density over the left base/retrocardiac region with significant interval improvement likely minimal residual left effusion/atelectasis. Right lung is clear. Increased lucency over the mid to upper lung suggesting emphysematous disease. Cardiomediastinal silhouette and remainder of the exam is unchanged. IMPRESSION: 1. Significant interval improvement left base/retrocardiac density likely small effusion/atelectasis. 2.  Suggestion of emphysematous disease. Electronically Signed   By: Marin Olp [name]   On: 09/09/2019 14:57    EKG: Independently reviewed.  SINUS TACHYCARDIA.   Assessment/Plan Active Problems:   Essential hypertension   Seizure disorder (HCC)   Paroxysmal atrial flutter (HCC)   Severe protein-calorie malnutrition (HCC)   GI bleeding   Secondary hypercoagulable state ([place])       GI bleed/acute anemia of blood loss probably secondary to GI bleed. Baseline hemoglobin around 8 and patient presents with a hemoglobin 4.9.   2 units of PRBC ordered for transfusion.  Repeat H&H after the 2 units. Keep hemoglobin greater than 7. GI consulted by EDP. Keep the patient n.p.o., continue with PPI IV and correct the INR.  Vitamin K IV given in ED. .   Seizure disorder Resume Keppra and Dilantin.   Paroxysmal atrial fibrillation on Coumadin INR supratherapeutic on admission 1 dose of IV vitamin K 10 mg given in ED.  Repeat INR in the morning. Rate controlled with amiodarone.  Hold anticoagulation for GI bleed/severe anemia 4.9. CHA2DS2-VASc score >3.  Echocardiogram showed Left ventricular ejection fraction, by estimation, is 60 to 65%. The left ventricle has normal function. The left ventricle has no regional wall motion abnormalities. Left ventricular diastolic function could not be evaluated.   GERD Patient denies any symptoms GERD.    Essential hypertension:  BP parameters are borderline.  Continue to monitor.    Leukocytosis of unclear etiology.  Get UA.  CXR showed improvement of the previous pneumonia.    Thrombocytosis Probably reactive from severe anemia.    Hyperglycemia Hemoglobin A1c ordered.   DVT prophylaxis: scd's Code Status:   FULL CODE.  Family Communication:  None at bedside.  Disposition Plan:   Patient is from:  HOME.   Anticipated DC to:  HOME.   Anticipated DC date:  3 DAYS.   Anticipated DC barriers: Improvement in anemia.  Consults called:  Gastroenterology Dr Therisa Doyne Admission status:   (inpatient /SDU  Severity of Illness: The appropriate patient status for this patient  is INPATIENT. Inpatient status is judged to be reasonable and necessary in order to provide the required intensity of service to ensure the patient's safety. The patient's presenting symptoms, physical exam findings, and initial radiographic and laboratory data in the context of their chronic comorbidities is felt to place them at high risk for further clinical deterioration. Furthermore, it is not anticipated that the patient will be medically stable for discharge from the hospital within 2 midnights of admission.   * I certify that at the point of admission it is my clinical judgment that the patient will require inpatient hospital care spanning beyond 2 midnights from the point  of admission due to high intensity of service, high risk for further deterioration and high frequency of surveillance required.*     Hosie Poisson MD [facility]  How to contact the [facility] Attending or Consulting provider [place] or covering provider during after hours [place], for this patient?   1. Check the care team in [facility] and look for a) attending/consulting [facility] provider listed and b) the [facility] team listed 2. Log into [email or web address] and use [place]'s universal password to access. If you do not have the password, please contact the hospital operator. 3. Locate the [facility] provider you are looking for under [facility] and page to a number that you can be directly reached. 4. If you still have difficulty reaching the provider, please page the [facility] (Director on Call) for the Hospitalists listed on amion for assistance.  09/09/2019, 6:29 PM

## 2019-09-10 LAB — HEMOGLOBIN AND HEMATOCRIT, BLOOD
HCT: 18.6 % — ABNORMAL LOW (ref 39.0–52.0)
HCT: 22.5 % — ABNORMAL LOW (ref 39.0–52.0)
Hemoglobin: 6.1 g/dL — CL (ref 13.0–17.0)
Hemoglobin: 7.3 g/dL — ABNORMAL LOW (ref 13.0–17.0)

## 2019-09-10 LAB — IRON AND TIBC
Iron: 97 ug/dL (ref 45–182)
Saturation Ratios: 52 % — ABNORMAL HIGH (ref 17.9–39.5)
TIBC: 185 ug/dL — ABNORMAL LOW (ref 250–450)
UIBC: 88 ug/dL

## 2019-09-10 LAB — BASIC METABOLIC PANEL
Anion gap: 7 (ref 5–15)
BUN: 21 mg/dL (ref 8–23)
CO2: 24 mmol/L (ref 22–32)
Calcium: 7.4 mg/dL — ABNORMAL LOW (ref 8.9–10.3)
Chloride: 109 mmol/L (ref 98–111)
Creatinine, Ser: 0.81 mg/dL (ref 0.61–1.24)
GFR calc Af Amer: >60 mL/min (ref >60)
GFR calc non Af Amer: >60 mL/min (ref >60)
Glucose, Bld: 99 mg/dL (ref 70–99)
Potassium: 3.5 mmol/L (ref 3.5–5.1)
Sodium: 140 mmol/L (ref 135–145)

## 2019-09-10 LAB — RETICULOCYTES
Immature Retic Fract: 43.2 % — ABNORMAL HIGH (ref 2.3–15.9)
RBC.: 2.16 MIL/uL — ABNORMAL LOW (ref 4.22–5.81)
Retic Count, Absolute: 120.7 10*3/uL (ref 19.0–186.0)
Retic Ct Pct: 5.6 % — ABNORMAL HIGH (ref 0.4–3.1)

## 2019-09-10 LAB — PREPARE RBC (CROSSMATCH)

## 2019-09-10 LAB — VITAMIN B12: Vitamin B-12: 873 pg/mL (ref 180–914)

## 2019-09-10 LAB — FERRITIN: Ferritin: 70 ng/mL (ref 24–336)

## 2019-09-10 LAB — PROTIME-INR
INR: 1.7 — ABNORMAL HIGH (ref 0.8–1.2)
Prothrombin Time: 19.7 seconds — ABNORMAL HIGH (ref 11.4–15.2)

## 2019-09-10 LAB — FOLATE: Folate: 30.2 ng/mL (ref >5.9)

## 2019-09-10 MED ORDER — SODIUM CHLORIDE 0.9% IV SOLUTION: Freq: Once | INTRAVENOUS | Status: AC

## 2019-09-10 MED ORDER — PANTOPRAZOLE SODIUM 40 MG IV SOLR
40.0000 mg | Freq: Two times a day (BID) | INTRAVENOUS | Status: DC
  Administered 2019-09-10 – 2019-09-12 (×5): 40 mg via INTRAVENOUS
  Filled 2019-09-10 (×5): qty 40

## 2019-09-10 NOTE — Progress Notes (Signed)
PROGRESS NOTE    [name]  YDX:[id number] DOB: [birth year] DOA: 09/09/2019 PCP: [name], MD    Chief Complaint  Patient presents with  . Weakness    Brief Narrative:  [name] is a 79 y.o. male with medical history significant of essential hypertension, history of seizure disorder, stroke, paroxysmal atrial fib flutter/fibrillation, GERD was brought in by her daughter for weakness associated with some palpitations and constant feeling of cold and chills.  Patient lives by himself and he reports that he has been feeling cold the last 2 weeks, reports constipation denies any rectal bleed or melena. On arrival to ED lab work revealed WBC count of 12.3, hemoglobin of 4.9, hematocrit of 16.9, platelets of 476, NR of 9.3, PT of 73, phenytoin of 9.  His stool for occult blood was positive.  He was admitted for evaluation of GI bleed.  A total of 3 units of PRBC transfused and his hemoglobin is 7.3.  GI consulted and he is scheduled for EGD in the morning    Assessment & Plan:   Active Problems:   Essential hypertension   Seizure disorder (HCC)   Paroxysmal atrial flutter (HCC)   Severe protein-calorie malnutrition (HCC)   GI bleeding   Secondary hypercoagulable state ([place])  GI bleed/acute anemia of blood loss probably secondary to GI bleed. Patient's baseline hemoglobin around 8 and presented with hemoglobin of 4.9. A total of 3 units of PRBC transfused and repeat hemoglobin is 7.3. GI consulted and he is scheduled for EGD in the morning. Clear liquid diet as ordered by GI, continue with PPI IV. N.p.o. after midnight.    Seizure disorder Continue with Keppra and Dilantin.   Paroxysmal atrial fibrillation on Coumadin CHA2DS2-VASc score greater than 3 Echocardiogram showed left ventricular ejection fraction of 60 to 65%. INR is currently subtherapeutic Coumadin on hold for GI bleed.    GERD Stable   Essential hypertension Blood pressure  parameters are borderline Continue to hold blood pressure medications and gently hydrate.   Severe protein calorie malnutrition  Nutrition will be consulted for further evaluation    DVT prophylaxis: (SCDs Code Status: Full code Family Communication: (None at bedside, discussed with daughter over the phone Disposition:   Status is: Inpatient  Remains inpatient appropriate because:IV treatments appropriate due to intensity of illness or inability to take PO   Dispo: The patient is from: Home              Anticipated d/c is to: Pending              Anticipated d/c date is: 2 days              Patient currently is not medically stable to d/c.       Consultants:   Gastroenterology   Procedures: EGD scheduled tomorrow   Antimicrobials: None   Subjective: No chest pain or sob.  No nausea vomiting or abdominal pain  Objective: Vitals:   09/10/19 0954 09/10/19 1210 09/10/19 1345 09/10/19 1550  BP: 104/60 109/60 (!) 98/56 (!) 96/56  Pulse: 78 84 72 68  Resp: 19 17 18 16   Temp: 98.6 F (37 C) 98.5 F (36.9 C) 98.8 F (37.1 C) 98.9 F (37.2 C)  TempSrc: Oral Oral Oral Oral  SpO2: 98% 95% 99% 97%  Weight:      Height:        Intake/Output Summary (Last 24 hours) at 09/10/2019 1648 Last data filed at 09/10/2019 1600 Gross per 24  hour  Intake 3780 ml  Output 400 ml  Net 3380 ml   Filed Weights   09/09/19 1415 09/09/19 1936  Weight: 61.2 kg 55.1 kg    Examination:  General exam: Appears calm and comfortable  Respiratory system: Clear to auscultation. Respiratory effort normal. Cardiovascular system: S1 & S2 heard, RRR. No JVD,  No pedal edema. Gastrointestinal system: Abdomen is nondistended, soft and nontender.Normal bowel sounds heard. Central nervous system: Alert and oriented. No focal neurological deficits. Extremities: Symmetric 5 x 5 power. Skin: No rashes, lesions or ulcers Psychiatry:  Mood & affect appropriate.     Data Reviewed: I have  personally reviewed following labs and imaging studies  CBC: Recent Labs  Lab 09/09/19 1410 09/10/19 0257 09/10/19 1543  WBC 12.3*  --   --   NEUTROABS 9.2*  --   --   HGB 4.9* 6.1* 7.3*  HCT 16.9* 18.6* 22.5*  MCV 100.0  --   --   PLT 476*  --   --     Basic Metabolic Panel: Recent Labs  Lab 09/09/19 1410 09/10/19 0257  NA 138 140  K 3.7 3.5  CL 106 109  CO2 23 24  GLUCOSE 193* 99  BUN 29* 21  CREATININE 0.98 0.81  CALCIUM 8.3* 7.4*    GFR: Estimated Creatinine Clearance: 56.7 mL/min (by C-G formula based on SCr of 0.81 mg/dL).  Liver Function Tests: Recent Labs  Lab 09/09/19 1410  AST 23  ALT 17  ALKPHOS 54  BILITOT 0.5  PROT 6.0*  ALBUMIN 3.2*    CBG: Recent Labs  Lab 09/09/19 1425  GLUCAP 182*     Recent Results (from the past 240 hour(s))  Blood culture (routine x 2)     Status: None (Preliminary result)   Collection Time: 09/09/19  2:20 PM   Specimen: BLOOD RIGHT FOREARM  Result Value Ref Range Status   Specimen Description BLOOD RIGHT FOREARM  Final   Special Requests   Final    BOTTLES DRAWN AEROBIC AND ANAEROBIC Blood Culture adequate volume   Culture   Final    NO GROWTH < 24 HOURS Performed at [facility], [place] [address] [place], [place] [address]    Report Status PENDING  Incomplete  Blood culture (routine x 2)     Status: None (Preliminary result)   Collection Time: 09/09/19  2:25 PM   Specimen: BLOOD LEFT FOREARM  Result Value Ref Range Status   Specimen Description BLOOD LEFT FOREARM  Final   Special Requests   Final    BOTTLES DRAWN AEROBIC ONLY Blood Culture results may not be optimal due to an inadequate volume of blood received in culture bottles   Culture   Final    NO GROWTH < 24 HOURS Performed at [facility], [place] [address] [place], [place] [address]    Report Status PENDING  Incomplete  SARS Coronavirus 2 by RT PCR (hospital order, performed in [facility] hospital lab) Nasopharyngeal Nasopharyngeal  Swab     Status: None   Collection Time: 09/09/19  3:31 PM   Specimen: Nasopharyngeal Swab  Result Value Ref Range Status   SARS Coronavirus 2 NEGATIVE NEGATIVE Final    Comment: (NOTE) SARS-CoV-2 target nucleic acids are NOT DETECTED.  The SARS-CoV-2 RNA is generally detectable in upper and lower respiratory specimens during the acute phase of infection. The lowest concentration of SARS-CoV-2 viral copies this assay can detect is 250 copies / mL. A negative result  does not preclude SARS-CoV-2 infection and should not be used as the sole basis for treatment or other patient management decisions.  A negative result may occur with improper specimen collection / handling, submission of specimen other than nasopharyngeal swab, presence of viral mutation(s) within the areas targeted by this assay, and inadequate number of viral copies (<250 copies / mL). A negative result must be combined with clinical observations, patient history, and epidemiological information.  Fact Sheet for Patients:   StrictlyIdeas.no  Fact Sheet for Healthcare Providers: BankingDealers.co.za  This test is not yet approved or  cleared by the [place] FDA and has been authorized for detection and/or diagnosis of SARS-CoV-2 by FDA under an Emergency Use Authorization (EUA).  This EUA will remain in effect (meaning this test can be used) for the duration of the COVID-19 declaration under Section 564(b)(1) of the Act, 21 U.S.C. section 360bbb-3(b)(1), unless the authorization is terminated or revoked sooner.  Performed at [facility], [place] [address], [place], [place] [address]          Radiology Studies: DG Chest Portable 1 View  Result Date: 09/09/2019 CLINICAL DATA:  Palpitations with weakness and fatigue. EXAM: PORTABLE CHEST 1 VIEW COMPARISON:  07/25/2019 FINDINGS: Lungs are somewhat hyperexpanded with subtle hazy density over the left  base/retrocardiac region with significant interval improvement likely minimal residual left effusion/atelectasis. Right lung is clear. Increased lucency over the mid to upper lung suggesting emphysematous disease. Cardiomediastinal silhouette and remainder of the exam is unchanged. IMPRESSION: 1. Significant interval improvement left base/retrocardiac density likely small effusion/atelectasis. 2.  Suggestion of emphysematous disease. Electronically Signed   By: Marin Olp [name]   On: 09/09/2019 14:57        Scheduled Meds: . amiodarone  200 mg Oral Daily  . folic acid  1 mg Intravenous Daily  . pantoprazole (PROTONIX) IV  40 mg Intravenous Q12H  . phenytoin  200 mg Oral Daily  . sodium chloride flush  3 mL Intravenous Once   Continuous Infusions: . sodium chloride 75 mL/hr at 09/10/19 1048  . levETIRAcetam 1,500 mg (09/10/19 0926)     LOS: 1 day        Hosie Poisson, MD [facility]   To contact the attending provider between 7A-7P or the covering provider during after hours 7P-7A, please log into the web site [email or web address] and access using universal [place] password for that web site. If you do not have the password, please call the hospital operator.  09/10/2019, 4:48 PM

## 2019-09-10 NOTE — Consult Note (Signed)
Referring Provider: Dr. Dene Gentry (ED) Primary Care Physician:  [name], MD Primary Gastroenterologist:  Dr. Therisa Doyne [facility])  Reason for Consultation:  Anemia, heme-positive stools  HPI: [name] is a 79 y.o. male with history of A. Fib (on Coumadin), dementia, seizures, gastric and duodenal AVMS (s/p APC in 2020) presenting with anemia and heme-positive stools.  Hgb 4.9 on arrival, now 6.1 s/p 2u pRBCs.  INR 9/3 on arrival, now 1.7 after 10mg  Vitamin K.  Patient oriented to self only.  Able to tell me he is in a hospital, but when asked to name the hospital, he reports "Monday." Reports year as 2002.  Unable to report president, reports "Elgie Congo."  He denies any abdominal pain, chest pain, shortness of breath, nausea, vomiting, black stools, or red stools.   Spoke with patient's daughter, she stated that patient is typically disoriented upon waking.  However, after being awake for some time, he is oriented.  He lives alone.  She states that he has not reported any recent black or red stools.  She states he has had recent constipation and he has a poor appetite.  He has chronic weight loss.  He has, however, been reporting weakness for the last couple of weeks.  Patient is on Coumadin at home for A. Fib but he is not on aspirin or NSAIDs. He has a history of GERD for which he previously took Protonix but has not been taking as of late.  Records reviewed:  Capsule 07/28/2018: multiple small to diminutive non-bleeding AVMs in stomach and duodenum, multiple non-bleeding xanthomas in small bowel.  EGD 07/23/2018: Two bleeding angioectasias in the stomach, treated with APC. One angioectasia in the duodenum, treated with APC.  Colonoscopy 12/2014 showed sigmoid diverticulosis and internal hemorrhoids but was otherwise unremarkable.  Past Medical History:  Diagnosis Date   Hiatal hernia    HTN (hypertension)    pt denies h/o HTN though it appears he was started on spironolactone  during his hospitalization 3/13   Pancreatitis    Seizures ([place])    Stroke [facility])    Typical atrial flutter ([place]) 3/13    Past Surgical History:  Procedure Laterality Date   ESOPHAGOGASTRODUODENOSCOPY N/A 11/30/2015   Procedure: ESOPHAGOGASTRODUODENOSCOPY (EGD);  Surgeon: Wonda Horner, MD;  Location: [facility] ENDOSCOPY;  Service: Endoscopy;  Laterality: N/A;   ESOPHAGOGASTRODUODENOSCOPY (EGD) WITH PROPOFOL N/A 07/23/2018   Procedure: ESOPHAGOGASTRODUODENOSCOPY (EGD) WITH PROPOFOL;  Surgeon: Otis Brace, MD;  Location: [place];  Service: Gastroenterology;  Laterality: N/A;   FEMUR IM NAIL Left 12/08/2016   FEMUR IM NAIL Left 12/07/2016   Procedure: INTRAMEDULLARY (IM) NAIL FEMORAL;  Surgeon: Renette Butters, MD;  Location: [place];  Service: Orthopedics;  Laterality: Left;   GIVENS CAPSULE STUDY N/A 07/24/2018   Procedure: GIVENS CAPSULE STUDY;  Surgeon: Otis Brace, MD;  Location: [place];  Service: Gastroenterology;  Laterality: N/A;   HIP SURGERY Left 01/2017   HOT HEMOSTASIS N/A 07/23/2018   Procedure: HOT HEMOSTASIS (ARGON PLASMA COAGULATION/BICAP);  Surgeon: Otis Brace, MD;  Location: [facility] ENDOSCOPY;  Service: Gastroenterology;  Laterality: N/A;   no surgical history  06/2011    Prior to Admission medications   Medication Sig Start Date End Date Taking? Authorizing Provider  amiodarone (PACERONE) 200 MG tablet Take 1 tablet (200 mg total) by mouth daily. Patient taking differently: Take 200 mg by mouth at bedtime.  08/22/19  Yes Fenton, Clint R, PA  feeding supplement, ENSURE ENLIVE, (ENSURE ENLIVE) LIQD Take 237 mLs by  mouth 3 (three) times daily between meals. 07/26/19  Yes Hosie Poisson, MD  ferrous sulfate 325 (65 FE) MG tablet Take 1 tablet (325 mg total) by mouth 3 (three) times daily with meals. Patient taking differently: Take 325 mg by mouth daily with breakfast.  12/15/16  Yes Velvet Bathe, MD  levETIRAcetam (KEPPRA) 500 MG tablet TAKE 3 TABLETS BY  MOUTH TWICE A DAY Patient taking differently: Take 1,500 mg by mouth 2 (two) times daily.  07/28/18  Yes Cameron Sprang, MD  Multiple Vitamin (MULTIVITAMIN WITH MINERALS) TABS tablet Take 1 tablet by mouth daily. 07/26/19  Yes Hosie Poisson, MD  phenytoin (DILANTIN) 200 MG ER capsule Take 1 capsule (200 mg total) by mouth at bedtime. Patient taking differently: Take 200 mg by mouth daily.  10/24/18  Yes Mercy Riding, MD  polyethylene glycol (MIRALAX / GLYCOLAX) 17 g packet Take 17 g by mouth daily as needed for mild constipation. 10/24/18  Yes Mercy Riding, MD  warfarin (COUMADIN) 2.5 MG tablet Take 1 tablet (2.5 mg total) by mouth See admin instructions. TAKE AS DIRECTED BY COUMADIN CLINIC. Patient taking differently: Take 2.5-5 mg by mouth See admin instructions. Take 2.5mg  on Monday and Friday, and 5mg  on Sunday, Tuesday, Wednesday, Thursday, and Saturday. TAKE AS DIRECTED BY COUMADIN CLINIC. Take at 6pm. 07/28/19  Yes Hosie Poisson, MD  folic acid (FOLVITE) 1 MG tablet Take 1 tablet (1 mg total) by mouth daily. Patient not taking: Reported on 09/09/2019 10/25/18   Mercy Riding, MD  pantoprazole (PROTONIX) 40 MG tablet Take 1 tablet (40 mg total) by mouth daily for 30 days. Patient not taking: Reported on 09/09/2019 07/26/18 09/09/19  Arrien, Jimmy Picket, MD    Current Facility-Administered Medications  Medication Dose Route Frequency Provider Last Rate Last Admin   0.9 %  sodium chloride infusion (Manually program via Guardrails IV Fluids)   Intravenous Once Hosie Poisson, MD       0.9 %  sodium chloride infusion   Intravenous Continuous Hosie Poisson, MD 75 mL/hr at 09/09/19 2113 New Bag at 09/09/19 2113   amiodarone (PACERONE) tablet 200 mg  200 mg Oral Daily Blenda Nicely, [facility]       folic acid injection 1 mg  1 mg Intravenous Daily Crosley, Debby, MD   1 mg at 09/09/19 2324   levETIRAcetam (KEPPRA) IVPB 1500 mg/ 100 mL premix  1,500 mg Intravenous Q12H Crosley, Debby, MD 400 mL/hr at  09/09/19 2253 1,500 mg at 09/09/19 2253   phenytoin (DILANTIN) ER capsule 200 mg  200 mg Oral Daily Blenda Nicely, [facility]       sodium chloride flush (NS) 0.9 % injection 3 mL  3 mL Intravenous Once Hosie Poisson, MD        Allergies as of 09/09/2019   (No Known Allergies)    Family History  Problem Relation Age of Onset   Hypertension Other     Social History   Socioeconomic History   Marital status: Divorced    Spouse name: Not on file   Number of children: Not on file   Years of education: Not on file   Highest education level: Not on file  Occupational History   Not on file  Tobacco Use   Smoking status: Former Smoker    Quit date: 07/15/1966    Years since quitting: 53.1   Smokeless tobacco: Never Used  Vaping Use   Vaping Use: Never used  Substance and Sexual Activity  Alcohol use: No    Comment: Quit greater than 5 years ago   Drug use: No   Sexual activity: Not on file  Other Topics Concern   Not on file  Social History Narrative   Lives alone.  Retired from [employer]   Social Determinants of Radio broadcast assistant Strain:    Difficulty of Paying Living Expenses:   Food Insecurity: No Food Insecurity   Worried About Charity fundraiser in the Last Year: Never true   Arboriculturist in the Last Year: Never true  Transportation Needs: No Transportation Needs   Lack of Transportation (Medical): No   Lack of Transportation (Non-Medical): No  Physical Activity:    Days of Exercise per Week:    Minutes of Exercise per Session:   Stress:    Feeling of Stress :   Social Connections:    Frequency of Communication with Friends and Family:    Frequency of Social Gatherings with Friends and Family:    Attends Religious Services:    Active Member of Clubs or Organizations:    Attends Music therapist:    Marital Status:   Intimate Partner Violence:    Fear of Current or Ex-Partner:    Emotionally Abused:     Physically Abused:    Sexually Abused:     Review of Systems: Review of Systems  Constitutional: Positive for malaise/fatigue and weight loss. Negative for chills and fever.  HENT: Negative for sore throat.   Eyes: Negative for pain and redness.  Respiratory: Negative for cough, shortness of breath and stridor.   Cardiovascular: Positive for palpitations. Negative for chest pain.  Gastrointestinal: Positive for constipation and heartburn. Negative for abdominal pain, blood in stool, diarrhea, melena, nausea and vomiting.  Genitourinary: Negative for flank pain and hematuria.  Musculoskeletal: Negative for back pain and joint pain.  Skin: Negative for itching and rash.  Neurological: Positive for seizures and weakness.  Endo/Heme/Allergies: Negative for polydipsia. Bruises/bleeds easily.  Psychiatric/Behavioral: Negative for substance abuse. The patient is not nervous/anxious.    Physical Exam: Physical Exam Vitals reviewed.  Constitutional:      General: He is not in acute distress.    Appearance: He is underweight.  HENT:     Head: Normocephalic and atraumatic.     Nose: Nose normal.     Mouth/Throat:     Mouth: Mucous membranes are moist.     Pharynx: Oropharynx is clear.  Eyes:     Extraocular Movements: Extraocular movements intact.     Comments: Conjunctival pallor  Cardiovascular:     Rate and Rhythm: Normal rate and regular rhythm.     Pulses: Normal pulses.     Heart sounds: Normal heart sounds.  Pulmonary:     Effort: Pulmonary effort is normal. No respiratory distress.     Breath sounds: Normal breath sounds.  Abdominal:     General: Abdomen is flat. Bowel sounds are normal. There is no distension.     Palpations: Abdomen is soft. There is no mass.     Tenderness: There is no abdominal tenderness. There is no guarding or rebound.     Hernia: No hernia is present.  Musculoskeletal:        General: No tenderness.     Cervical back: Normal range of motion  and neck supple.     Right lower leg: No edema.     Left lower leg: No edema.  Skin:    General:  Skin is warm and dry.  Neurological:     Mental Status: He is lethargic and disoriented.     Comments: Sleeping but easily awakens to voice alone  Psychiatric:        Mood and Affect: Mood normal.        Behavior: Behavior normal.     Vital signs in last 24 hours: Temp:  [98.2 F (36.8 C)-99.8 F (37.7 C)] 99.8 F (37.7 C) (06/21 0449) Pulse Rate:  [74-106] 76 (06/21 0449) Resp:  [13-23] 17 (06/21 0449) BP: (90-128)/(48-102) 98/52 (06/21 0449) SpO2:  [80 %-100 %] 95 % (06/21 0449) Weight:  [55.1 kg-61.2 kg] 55.1 kg (06/20 1936)    Intake/Output from previous day: 06/20 0701 - 06/21 0700 In: 1680 [Blood:630; IV Piggyback:1050] Out: -  Intake/Output this shift: No intake/output data recorded.  Lab Results: Recent Labs    09/09/19 1410 09/10/19 0257  WBC 12.3*  --   HGB 4.9* 6.1*  HCT 16.9* 18.6*  PLT 476*  --    BMET Recent Labs    09/09/19 1410 09/10/19 0257  NA 138 140  K 3.7 3.5  CL 106 109  CO2 23 24  GLUCOSE 193* 99  BUN 29* 21  CREATININE 0.98 0.81  CALCIUM 8.3* 7.4*   LFT Recent Labs    09/09/19 1410  PROT 6.0*  ALBUMIN 3.2*  AST 23  ALT 17  ALKPHOS 54  BILITOT 0.5  BILIDIR <0.1  IBILI NOT CALCULATED   PT/INR Recent Labs    09/09/19 1410 09/10/19 0257  LABPROT 73.0* 19.7*  INR 9.3* 1.7*    Studies/Results: DG Chest Portable 1 View  Result Date: 09/09/2019 CLINICAL DATA:  Palpitations with weakness and fatigue. EXAM: PORTABLE CHEST 1 VIEW COMPARISON:  07/25/2019 FINDINGS: Lungs are somewhat hyperexpanded with subtle hazy density over the left base/retrocardiac region with significant interval improvement likely minimal residual left effusion/atelectasis. Right lung is clear. Increased lucency over the mid to upper lung suggesting emphysematous disease. Cardiomediastinal silhouette and remainder of the exam is unchanged. IMPRESSION: 1.  Significant interval improvement left base/retrocardiac density likely small effusion/atelectasis. 2.  Suggestion of emphysematous disease. Electronically Signed   By: Marin Olp [name]   On: 09/09/2019 14:57    Impression: Anemia and heme-positive stools: no overt GI bleeding -Hgb 4.9 on arrival, now 6.1 s/p 2u pRBCs.  INR 9/3 on arrival, now 1.7 after 10mg  Vitamin K. -History of gastric and duodenal AVMS s/p APC in 2020 -BUN trending now.  Today, BUN 21/Cr 0.81.  On arrival, BUN elevated to 29 with Cr of 0.98 compared to BUN 9/Cr 0.71 at baseline.  Elevated BUN:Cr suggestive of upper GI bleeding.   -No iron deficiency, though patient is on ferrous sulfate daily  -BP is soft (90s/50s) but heart rate within normal limits  A Fib, on Coumadin  Seizures, on Keppra  Dementia  Plan: Protonix 40mg  IV BID.  Continue to monitor H&H with transfusion as needed to maintain hemoglobin greater than 7.  Continue supportive care with IV fluids.  We will plan for EGD tomorrow.  I discussed the procedure with the patient and the patient's daughter and they are in agreement to proceed.  Clears OK today with NPO after midnight.  [facility] will follow.   LOS: 1 day   Salley Slaughter  09/10/2019, 8:14 AM   Pager [phone] If no answer or after 5 PM call [phone]

## 2019-09-10 NOTE — Consult Note (Signed)
   [facility] Inpatient Consult   09/10/2019  [name] [facility] [birth year] [id number]   Patient is currently active with [facility] for chronic disease management services.  Patient has been engaged by a [facility] Coordinator.  Our community based plan of care has focused on disease management and community resource support.  Patient has also been referred to [facility] for follow up.  I have briefly reviewed patient encounters and notes reveal patient is fully vaccinated 05/23/19 and 06/19/19 for COVID-19.  Plan:  Follow up with Inpatient Transition Of Care [TOC] team member to make aware that [facility] actively following.   Of note, [facility] Care Management services does not replace or interfere with any services that are needed or arranged by inpatient [facility] care management team.  For additional questions or referrals please contact:  Natividad Brood, RN BSN [facility] Liaison  [phone] business mobile phone Toll free office [phone]  Fax number: [phone] [name]

## 2019-09-11 ENCOUNTER — Encounter (HOSPITAL_COMMUNITY): Payer: Self-pay | Admitting: Internal Medicine

## 2019-09-11 ENCOUNTER — Inpatient Hospital Stay (HOSPITAL_COMMUNITY): Payer: Medicare Other | Admitting: Anesthesiology

## 2019-09-11 ENCOUNTER — Encounter (HOSPITAL_COMMUNITY)
Admission: EM | Disposition: A | Discharge status: Home Health | Payer: Self-pay | Source: Home / Self Care | Attending: Internal Medicine

## 2019-09-11 HISTORY — PX: HOT HEMOSTASIS: SHX5433

## 2019-09-11 HISTORY — PX: ESOPHAGOGASTRODUODENOSCOPY: SHX5428

## 2019-09-11 LAB — BASIC METABOLIC PANEL
Anion gap: 8 (ref 5–15)
BUN: 11 mg/dL (ref 8–23)
CO2: 22 mmol/L (ref 22–32)
Calcium: 7 mg/dL — ABNORMAL LOW (ref 8.9–10.3)
Chloride: 108 mmol/L (ref 98–111)
Creatinine, Ser: 0.63 mg/dL (ref 0.61–1.24)
GFR calc Af Amer: >60 mL/min (ref >60)
GFR calc non Af Amer: >60 mL/min (ref >60)
Glucose, Bld: 92 mg/dL (ref 70–99)
Potassium: 3.1 mmol/L — ABNORMAL LOW (ref 3.5–5.1)
Sodium: 138 mmol/L (ref 135–145)

## 2019-09-11 LAB — CBC WITH DIFFERENTIAL/PLATELET
Abs Immature Granulocytes: 0.25 10*3/uL — ABNORMAL HIGH (ref 0.00–0.07)
Basophils Absolute: 0.1 10*3/uL (ref 0.0–0.1)
Basophils Relative: 1 %
Eosinophils Absolute: 0.3 10*3/uL (ref 0.0–0.5)
Eosinophils Relative: 3 %
HCT: 22.5 % — ABNORMAL LOW (ref 39.0–52.0)
Hemoglobin: 7.5 g/dL — ABNORMAL LOW (ref 13.0–17.0)
Immature Granulocytes: 3 %
Lymphocytes Relative: 10 %
Lymphs Abs: 0.9 10*3/uL (ref 0.7–4.0)
MCH: 29.2 pg (ref 26.0–34.0)
MCHC: 33.3 g/dL (ref 30.0–36.0)
MCV: 87.5 fL (ref 80.0–100.0)
Monocytes Absolute: 1.1 10*3/uL — ABNORMAL HIGH (ref 0.1–1.0)
Monocytes Relative: 12 %
Neutro Abs: 6.6 10*3/uL (ref 1.7–7.7)
Neutrophils Relative %: 71 %
Platelets: 273 10*3/uL (ref 150–400)
RBC: 2.57 MIL/uL — ABNORMAL LOW (ref 4.22–5.81)
RDW: 22.1 % — ABNORMAL HIGH (ref 11.5–15.5)
WBC: 9.1 10*3/uL (ref 4.0–10.5)
nRBC: 2 % — ABNORMAL HIGH (ref 0.0–0.2)

## 2019-09-11 LAB — BPAM RBC
Blood Product Expiration Date: 202106232359
Blood Product Expiration Date: 202106232359
Blood Product Expiration Date: 202106272359
ISSUE DATE / TIME: 202106201710
ISSUE DATE / TIME: 202106202130
ISSUE DATE / TIME: 202106210931
Unit Type and Rh: 9500
Unit Type and Rh: 9500
Unit Type and Rh: 9500

## 2019-09-11 LAB — TYPE AND SCREEN
ABO/RH(D): O NEG
Antibody Screen: NEGATIVE
Unit division: 0
Unit division: 0
Unit division: 0

## 2019-09-11 LAB — URINE CULTURE: Culture: NO GROWTH

## 2019-09-11 LAB — HEMOGLOBIN A1C
Hgb A1c MFr Bld: 5.3 % (ref 4.8–5.6)
Mean Plasma Glucose: 105 mg/dL

## 2019-09-11 LAB — PROTIME-INR
INR: 1.4 — ABNORMAL HIGH (ref 0.8–1.2)
Prothrombin Time: 16.3 seconds — ABNORMAL HIGH (ref 11.4–15.2)

## 2019-09-11 SURGERY — EGD (ESOPHAGOGASTRODUODENOSCOPY)
Anesthesia: Monitor Anesthesia Care

## 2019-09-11 MED ORDER — PHENYLEPHRINE 40 MCG/ML (10ML) SYRINGE FOR IV PUSH (FOR BLOOD PRESSURE SUPPORT)
PREFILLED_SYRINGE | INTRAVENOUS | Status: DC | PRN
  Administered 2019-09-11: 80 ug via INTRAVENOUS

## 2019-09-11 MED ORDER — PROPOFOL 10 MG/ML IV BOLUS
INTRAVENOUS | Status: DC | PRN
  Administered 2019-09-11: 30 mg via INTRAVENOUS
  Administered 2019-09-11: 10 mg via INTRAVENOUS
  Administered 2019-09-11: 20 mg via INTRAVENOUS

## 2019-09-11 MED ORDER — SODIUM CHLORIDE 0.9 % IV SOLN: INTRAVENOUS | Status: DC

## 2019-09-11 MED ORDER — POTASSIUM CHLORIDE 10 MEQ/100ML IV SOLN
10.0000 meq | INTRAVENOUS | Status: AC
  Administered 2019-09-11 (×2): 10 meq via INTRAVENOUS
  Filled 2019-09-11 (×2): qty 100

## 2019-09-11 MED ORDER — ENSURE ENLIVE PO LIQD: 237.0000 mL | Freq: Two times a day (BID) | ORAL | Status: DC

## 2019-09-11 MED ORDER — ADULT MULTIVITAMIN W/MINERALS CH
1.0000 | ORAL_TABLET | Freq: Every day | ORAL | Status: DC
  Administered 2019-09-11 – 2019-09-13 (×3): 1 via ORAL
  Filled 2019-09-11 (×3): qty 1

## 2019-09-11 MED ORDER — PROPOFOL 500 MG/50ML IV EMUL
INTRAVENOUS | Status: DC | PRN
  Administered 2019-09-11: 100 ug/kg/min via INTRAVENOUS

## 2019-09-11 NOTE — Transfer of Care (Signed)
Immediate Anesthesia Transfer of Care Note  Patient: [name]  Procedure(s) Performed: ESOPHAGOGASTRODUODENOSCOPY (EGD) (N/A )  Patient Location: PACU and Endoscopy Unit  Anesthesia Type:MAC  Level of Consciousness: awake, patient cooperative and responds to stimulation  Airway & Oxygen Therapy: Patient Spontanous Breathing  Post-op Assessment: Report given to RN and Post -op Vital signs reviewed and stable  Post vital signs: Reviewed and stable  Last Vitals:  Vitals Value Taken Time  BP    Temp    Pulse    Resp    SpO2      Last Pain:  Vitals:   09/11/19 1213  TempSrc: Oral  PainSc: 0-No pain         Complications: No complications documented.

## 2019-09-11 NOTE — Progress Notes (Signed)
PT Cancellation Note  Patient Details Name: [name] MRN: [id number] DOB: 05-11-1940   Cancelled Treatment:    Reason Eval/Treat Not Completed: Patient at procedure or test/unavailable. Will re-attempt later of time allows.    [name] 09/11/2019, 12:27 PM

## 2019-09-11 NOTE — Progress Notes (Signed)
Initial Nutrition Assessment  DOCUMENTATION CODES:   Underweight, Severe malnutrition in context of chronic illness  INTERVENTION:    Ensure Enlive po BID, each supplement provides 350 kcal and 20 grams of protein  Magic cup TID with meals, each supplement provides 290 kcal and 9 grams of protein  MVI daily   NUTRITION DIAGNOSIS:   Severe Malnutrition related to chronic illness (dementia) as evidenced by severe fat depletion, severe muscle depletion, percent weight loss.  GOAL:   Patient will meet greater than or equal to 90% of their needs  MONITOR:   PO intake, Supplement acceptance, Weight trends, Labs, I & O's  REASON FOR ASSESSMENT:   Consult Assessment of nutrition requirement/status  ASSESSMENT:   Patient with PMH significant for essential HTN, seizures, dementia, stroke, and GERD. Presents this admission with GI bleed.   Pt underwent EGD this am. Found angiodysplasia in duodenum treated with APC. Okay to advance diet per GI.   Pt upset about not leaving for procedure on time. Endorses loss in appetite over the last week but unable to elaborate any further. Appetite progressing this admission. Last meal completion charted as 50%. Discussed the importance of protein intake for preservation of lean body mass. Pt willing to try Ensure.   Pt unsure of UBW but is aware of recent wt loss. Records indicate pt weighed 128 lb on 6/2 and 121 lb this admission (5.5% wt loss in three weeks, significant for time frame).   Medications: folic acid Labs: K 3.1 (L)   NUTRITION - FOCUSED PHYSICAL EXAM:    Most Recent Value  Orbital Region Severe depletion  Upper Arm Region Severe depletion  Thoracic and Lumbar Region Severe depletion  Buccal Region Severe depletion  Temple Region Severe depletion  Clavicle Bone Region Severe depletion  Clavicle and Acromion Bone Region Severe depletion  Scapular Bone Region Severe depletion  Dorsal Hand Severe depletion  Patellar Region  Severe depletion  Anterior Thigh Region Severe depletion  Posterior Calf Region Severe depletion  Edema (RD Assessment) Severe  Hair Reviewed  Eyes Reviewed  Mouth Reviewed  Skin Reviewed  Nails Reviewed     Diet Order:   Diet Order            Diet NPO time specified  Diet effective midnight                 EDUCATION NEEDS:   Not appropriate for education at this time  Skin:  Skin Assessment: Reviewed RN Assessment  Last BM:  6/19  Height:   Ht Readings from Last 1 Encounters:  09/09/19 6\' 1"  (1.854 m)    Weight:   Wt Readings from Last 1 Encounters:  09/09/19 55.1 kg    BMI:  Body mass index is 16.03 kg/m.  Estimated Nutritional Needs:   Kcal:  1800-2000 kcal  Protein:  90-105 grams  Fluid:  >/= 1.8 L/day   Mariana Single RD, LDN Clinical Nutrition Pager listed in [place]

## 2019-09-11 NOTE — Progress Notes (Signed)
See EGD note. 3 angiodysplasia in duodenum treated with APC. Would hold coumadin for 3 days post treatment. OK to feed. Will sign off. Call if needed.

## 2019-09-11 NOTE — Anesthesia Procedure Notes (Signed)
Procedure Name: MAC Date/Time: 09/11/2019 1:29 PM Performed by: Jenne Campus, CRNA Pre-anesthesia Checklist: Patient identified, Emergency Drugs available, Suction available and Patient being monitored Oxygen Delivery Method: Nasal cannula

## 2019-09-11 NOTE — Progress Notes (Signed)
PROGRESS NOTE    [name]  EPP:[id number] DOB: 1940/09/13 DOA: 09/09/2019 PCP: [name], MD    Chief Complaint  Patient presents with  . Weakness    Brief Narrative:  [name] is a 79 y.o. male with medical history significant of essential hypertension, history of seizure disorder, stroke, paroxysmal atrial fib flutter/fibrillation, GERD was brought in by her daughter for weakness associated with some palpitations and constant feeling of cold and chills.  Patient lives by himself and he reports that he has been feeling cold the last 2 weeks, reports constipation denies any rectal bleed or melena. On arrival to ED lab work revealed WBC count of 12.3, hemoglobin of 4.9, hematocrit of 16.9, platelets of 476, NR of 9.3, PT of 73, phenytoin of 9.  His stool for occult blood was positive.  He was admitted for evaluation of GI bleed.  A total of 3 units of PRBC transfused and his hemoglobin is 7.3.  GI consulted and he is underwent EGD today showing Three angiodysplastic lesions in the duodenum. It was treated with argon plasma coagulation. Rest of the stomach was wnl.  Regular diet resumed, hold coumadin for 3 days post  APC. PT/OT EVAL ordered.  Possible d/c in the morning if able to tolerate diet and hemoglobin remains stable.    Assessment & Plan:   Active Problems:   Essential hypertension   Seizure disorder (HCC)   Paroxysmal atrial flutter (HCC)   Severe protein-calorie malnutrition (HCC)   GI bleeding   Secondary hypercoagulable state ([place])  GI bleed/acute anemia of blood loss probably secondary to GI bleed. Patient's baseline hemoglobin around 8 and presented with hemoglobin of 4.9. A total of 3 units of PRBC transfused and repeat hemoglobin is 7.5 GI consulted and he underwent  EGD today showing Three angiodysplastic lesions in the duodenum. It was treated with argon plasma coagulation. Rest of the stomach was wnl.  Regular diet resumed, hold coumadin  for 3 days post  APC. PT/OT EVAL ordered.  Possible d/c in the morning if able to tolerate diet and hemoglobin remains stable.  Change PPI to oral on dishcarge.    Seizure disorder Continue with Keppra and Dilantin.   Paroxysmal atrial fibrillation on Coumadin CHA2DS2-VASc score greater than 3 Echocardiogram showed left ventricular ejection fraction of 60 to 65%. INR is currently subtherapeutic. Hold coumadin for 3 days post APC, it can be resumed on 09/15/2019.      GERD Stable   Essential hypertension Well controlled BP parameters.    Severe protein calorie malnutrition  Nutrition will be consulted for further evaluation    DVT prophylaxis: (SCDs Code Status: Full code Family Communication: (None at bedside, discussed with daughter over the phone Disposition:   Status is: Inpatient  Remains inpatient appropriate because:IV treatments appropriate due to intensity of illness or inability to take PO   Dispo: The patient is from: Home              Anticipated d/c is to: Pending              Anticipated d/c date is: 1 day              Patient currently is not medically stable to d/c.       Consultants:   Gastroenterology   Procedures: EGD    Antimicrobials: None   Subjective: Patient denies any chest pain, shortness of breath, nausea vomiting abdominal pain.  Objective: Vitals:   09/11/19 1400 09/11/19 1410  09/11/19 1423 09/11/19 1534  BP: (!) 100/43 (!) 103/57 (!) 99/56 (!) 98/54  Pulse: 72 77 68 71  Resp: 19 19 17 17   Temp:   98.6 F (37 C) 98.9 F (37.2 C)  TempSrc:   Oral Oral  SpO2: 93% 100% 100% 100%  Weight:      Height:        Intake/Output Summary (Last 24 hours) at 09/11/2019 1552 Last data filed at 09/11/2019 1350 Gross per 24 hour  Intake 2105 ml  Output 700 ml  Net 1405 ml   Filed Weights   09/09/19 1415 09/09/19 1936  Weight: 61.2 kg 55.1 kg    Examination:  General exam: Alert and comfortable, not in any kind of  distress Respiratory system: Clear to auscultation bilaterally, no wheezing or rhonchi, no tachypnea Cardiovascular system: S1-S2 heard, regular rate rhythm, no JVD, no pedal edema Gastrointestinal system: Abdomen is soft, nontender, nondistended, bowel sounds normal Central nervous system: Alert and oriented to place and person, grossly normal focal Extremities: No pedal edema, cyanosis or clubbing Skin: No rashes seen Psychiatry: Mood is appropriate    Data Reviewed: I have personally reviewed following labs and imaging studies  CBC: Recent Labs  Lab 09/09/19 1410 09/10/19 0257 09/10/19 1543 09/11/19 0350  WBC 12.3*  --   --  9.1  NEUTROABS 9.2*  --   --  6.6  HGB 4.9* 6.1* 7.3* 7.5*  HCT 16.9* 18.6* 22.5* 22.5*  MCV 100.0  --   --  87.5  PLT 476*  --   --  101    Basic Metabolic Panel: Recent Labs  Lab 09/09/19 1410 09/10/19 0257 09/11/19 0350  NA 138 140 138  K 3.7 3.5 3.1*  CL 106 109 108  CO2 23 24 22   GLUCOSE 193* 99 92  BUN 29* 21 11  CREATININE 0.98 0.81 0.63  CALCIUM 8.3* 7.4* 7.0*    GFR: Estimated Creatinine Clearance: 57.4 mL/min (by C-G formula based on SCr of 0.63 mg/dL).  Liver Function Tests: Recent Labs  Lab 09/09/19 1410  AST 23  ALT 17  ALKPHOS 54  BILITOT 0.5  PROT 6.0*  ALBUMIN 3.2*    CBG: Recent Labs  Lab 09/09/19 1425  GLUCAP 182*     Recent Results (from the past 240 hour(s))  Blood culture (routine x 2)     Status: None (Preliminary result)   Collection Time: 09/09/19  2:20 PM   Specimen: BLOOD RIGHT FOREARM  Result Value Ref Range Status   Specimen Description BLOOD RIGHT FOREARM  Final   Special Requests   Final    BOTTLES DRAWN AEROBIC AND ANAEROBIC Blood Culture adequate volume   Culture   Final    NO GROWTH 2 DAYS Performed at [facility], [place] [address], [place], [place] [address]    Report Status PENDING  Incomplete  Blood culture (routine x 2)     Status: None (Preliminary result)   Collection  Time: 09/09/19  2:25 PM   Specimen: BLOOD LEFT FOREARM  Result Value Ref Range Status   Specimen Description BLOOD LEFT FOREARM  Final   Special Requests   Final    BOTTLES DRAWN AEROBIC ONLY Blood Culture results may not be optimal due to an inadequate volume of blood received in culture bottles   Culture   Final    NO GROWTH 2 DAYS Performed at [facility], [place] [address] [place], [place] [address]    Report Status PENDING  Incomplete  SARS Coronavirus 2 by RT PCR (hospital order, performed in [facility] hospital lab) Nasopharyngeal Nasopharyngeal Swab     Status: None   Collection Time: 09/09/19  3:31 PM   Specimen: Nasopharyngeal Swab  Result Value Ref Range Status   SARS Coronavirus 2 NEGATIVE NEGATIVE Final    Comment: (NOTE) SARS-CoV-2 target nucleic acids are NOT DETECTED.  The SARS-CoV-2 RNA is generally detectable in upper and lower respiratory specimens during the acute phase of infection. The lowest concentration of SARS-CoV-2 viral copies this assay can detect is 250 copies / mL. A negative result does not preclude SARS-CoV-2 infection and should not be used as the sole basis for treatment or other patient management decisions.  A negative result may occur with improper specimen collection / handling, submission of specimen other than nasopharyngeal swab, presence of viral mutation(s) within the areas targeted by this assay, and inadequate number of viral copies (<250 copies / mL). A negative result must be combined with clinical observations, patient history, and epidemiological information.  Fact Sheet for Patients:   StrictlyIdeas.no  Fact Sheet for Healthcare Providers: BankingDealers.co.za  This test is not yet approved or  cleared by the [place] FDA and has been authorized for detection and/or diagnosis of SARS-CoV-2 by FDA under an Emergency Use Authorization (EUA).  This EUA will remain in effect  (meaning this test can be used) for the duration of the COVID-19 declaration under Section 564(b)(1) of the Act, 21 U.S.C. section 360bbb-3(b)(1), unless the authorization is terminated or revoked sooner.  Performed at [facility], [place] [address], [place], [place] [address]   Urine culture     Status: None   Collection Time: 09/09/19  9:06 PM   Specimen: Urine, Random  Result Value Ref Range Status   Specimen Description URINE, RANDOM  Final   Special Requests NONE  Final   Culture   Final    NO GROWTH Performed at [facility], [place] [address] [place], [place] [address]    Report Status 09/11/2019 FINAL  Final         Radiology Studies: No results found.      Scheduled Meds: . amiodarone  200 mg Oral Daily  . feeding supplement (ENSURE ENLIVE)  237 mL Oral BID BM  . folic acid  1 mg Intravenous Daily  . multivitamin with minerals  1 tablet Oral Daily  . pantoprazole (PROTONIX) IV  40 mg Intravenous Q12H  . phenytoin  200 mg Oral Daily  . sodium chloride flush  3 mL Intravenous Once   Continuous Infusions: . sodium chloride 75 mL/hr at 09/11/19 1538  . levETIRAcetam 1,500 mg (09/11/19 0904)     LOS: 2 days        Hosie Poisson, MD [facility]   To contact the attending provider between 7A-7P or the covering provider during after hours 7P-7A, please log into the web site [email or web address] and access using universal [place] password for that web site. If you do not have the password, please call the hospital operator.  09/11/2019, 3:52 PM

## 2019-09-11 NOTE — Anesthesia Postprocedure Evaluation (Signed)
Anesthesia Post Note  Patient: [name]  Procedure(s) Performed: ESOPHAGOGASTRODUODENOSCOPY (EGD) (N/A )     Patient location during evaluation: Endoscopy Anesthesia Type: MAC Level of consciousness: awake and alert Pain management: pain level controlled Vital Signs Assessment: post-procedure vital signs reviewed and stable Respiratory status: spontaneous breathing, nonlabored ventilation, respiratory function stable and patient connected to nasal cannula oxygen Cardiovascular status: stable and blood pressure returned to baseline Postop Assessment: no apparent nausea or vomiting Anesthetic complications: no   No complications documented.  Last Vitals:  Vitals:   09/11/19 1423 09/11/19 1534  BP: (!) 99/56 (!) 98/54  Pulse: 68 71  Resp: 17 17  Temp: 37 C 37.2 C  SpO2: 100% 100%    Last Pain:  Vitals:   09/11/19 1534  TempSrc: Oral  PainSc:                  Aubriee Szeto COKER

## 2019-09-11 NOTE — Op Note (Signed)
[facility] Patient Name: [name] Procedure Date : 09/11/2019 MRN: [id number] Attending MD: Wonda Horner , MD Date of Birth: [birth year] CSN: [id number] Age: 79 Admit Type: Inpatient Procedure:                Upper GI endoscopy Indications:              Iron deficiency anemia, Heme positive stool Providers:                Wonda Horner, MD, Baird Cancer, RN, Wynonia Sours,                            RN, Lazaro Arms, Technician Referring MD:              Medicines:                Propofol per Anesthesia Complications:            No immediate complications. Estimated Blood Loss:     Estimated blood loss: none. Procedure:                Pre-Anesthesia Assessment:                           - Prior to the procedure, a History and Physical                            was performed, and patient medications and                            allergies were reviewed. The patient's tolerance of                            previous anesthesia was also reviewed. The risks                            and benefits of the procedure and the sedation                            options and risks were discussed with the patient.                            All questions were answered, and informed consent                            was obtained. Prior Anticoagulants: The patient has                            taken Coumadin (warfarin), last dose was 3 days                            prior to procedure. ASA Grade Assessment: III - A                            patient with severe systemic disease. After  reviewing the risks and benefits, the patient was                            deemed in satisfactory condition to undergo the                            procedure.                           After obtaining informed consent, the endoscope was                            passed under direct vision. Throughout the                            procedure, the patient's blood  pressure, pulse, and                            oxygen saturations were monitored continuously. The                            GIF-H190 ([id number]) Olympus gastroscope was                            introduced through the mouth, and advanced to the                            second part of duodenum. The upper GI endoscopy was                            accomplished without difficulty. The patient                            tolerated the procedure well. Scope In: Scope Out: Findings:      The examined esophagus was normal.      The entire examined stomach was normal.      Three small angiodysplastic lesions were found in the second portion of       the duodenum. Fulguration to cauterize by argon plasma was successful. Impression:               - Normal esophagus.                           - Normal stomach.                           - Three angiodysplastic lesions in the duodenum.                            Treated with argon plasma coagulation (APC).                           - No specimens collected. Recommendation:           - Resume regular diet.                           -  Continue present medications. Procedure Code(s):        --- Professional ---                           [phone], Esophagogastroduodenoscopy, flexible,                            transoral; diagnostic, including collection of                            specimen(s) by brushing or washing, when performed                            (separate procedure) Diagnosis Code(s):        --- Professional ---                           X91.478, Angiodysplasia of stomach and duodenum                            without bleeding                           D50.9, Iron deficiency anemia, unspecified                           R19.5, Other fecal abnormalities CPT copyright 2019 American Medical Association. All rights reserved. The codes documented in this report are preliminary and upon coder review may  be revised to meet current  compliance requirements. Wonda Horner, MD 09/11/2019 1:58:55 PM This report has been signed electronically. Number of Addenda: 0

## 2019-09-11 NOTE — H&P (Signed)
The patient presents to the endoscopy unit for EGD to evaluate heme positive stool and anemia.  He has a history of gastric and duodenal AVMs which have been treated with argon plasma coagulation in the past.  Other history and physical reviewed, no change today.  Physical:  No distress  Heart regular rhythm  Lungs clear  Abdomen soft and nontender  Impression: Anemia and heme positive stool with history of gastric and duodenal AVMs  Plan: EGD

## 2019-09-11 NOTE — Progress Notes (Signed)
OT Cancellation Note  Patient Details Name: Sostenes Kauffmann MRN: [id number] DOB: 10-13-1940   Cancelled Treatment:    Reason Eval/Treat Not Completed: Patient declined, no reason specified;Other (comment); pt reports not feeling good and requesting to hold therapies until after procedure today, frustrated with OT attempts to mobilize. Will follow up as able.  Lou Cal, OT Acute Rehabilitation Services Pager [phone] Office [phone]   Raymondo Band 09/11/2019, 9:41 AM

## 2019-09-11 NOTE — Anesthesia Preprocedure Evaluation (Addendum)
Anesthesia Evaluation  Patient identified by MRN, date of birth, ID band Patient awake    Reviewed: Allergy & Precautions, NPO status , Patient's Chart, lab work & pertinent test results  Airway Mallampati: II  TM Distance: >3 FB Neck ROM: Full    Dental   Pulmonary former smoker,    breath sounds clear to auscultation       Cardiovascular hypertension,  Rhythm:Regular Rate:Normal     Neuro/Psych    GI/Hepatic   Endo/Other    Renal/GU      Musculoskeletal   Abdominal   Peds  Hematology   Anesthesia Other Findings   Reproductive/Obstetrics                            Anesthesia Physical Anesthesia Plan  ASA: III  Anesthesia Plan: MAC   Post-op Pain Management:    Induction: Intravenous  PONV Risk Score and Plan: Ondansetron and Propofol infusion  Airway Management Planned: Natural Airway and Nasal Cannula  Additional Equipment:   Intra-op Plan:   Post-operative Plan:   Informed Consent: I have reviewed the patients History and Physical, chart, labs and discussed the procedure including the risks, benefits and alternatives for the proposed anesthesia with the patient or authorized representative who has indicated his/her understanding and acceptance.       Plan Discussed with: CRNA and Anesthesiologist  Anesthesia Plan Comments:         Anesthesia Quick Evaluation

## 2019-09-12 DIAGNOSIS — E876 Hypokalemia: Secondary | ICD-10-CM

## 2019-09-12 DIAGNOSIS — D62 Acute posthemorrhagic anemia: Secondary | ICD-10-CM

## 2019-09-12 DIAGNOSIS — E43 Unspecified severe protein-calorie malnutrition: Secondary | ICD-10-CM

## 2019-09-12 LAB — CBC WITH DIFFERENTIAL/PLATELET
Abs Immature Granulocytes: 0 10*3/uL (ref 0.00–0.07)
Basophils Absolute: 0.2 10*3/uL — ABNORMAL HIGH (ref 0.0–0.1)
Basophils Relative: 2 %
Eosinophils Absolute: 0 10*3/uL (ref 0.0–0.5)
Eosinophils Relative: 0 %
HCT: 27 % — ABNORMAL LOW (ref 39.0–52.0)
Hemoglobin: 8.7 g/dL — ABNORMAL LOW (ref 13.0–17.0)
Lymphocytes Relative: 1 %
Lymphs Abs: 0.1 10*3/uL — ABNORMAL LOW (ref 0.7–4.0)
MCH: 28.8 pg (ref 26.0–34.0)
MCHC: 32.2 g/dL (ref 30.0–36.0)
MCV: 89.4 fL (ref 80.0–100.0)
Monocytes Absolute: 0.2 10*3/uL (ref 0.1–1.0)
Monocytes Relative: 2 %
Neutro Abs: 10.7 10*3/uL — ABNORMAL HIGH (ref 1.7–7.7)
Neutrophils Relative %: 95 %
Platelets: 328 10*3/uL (ref 150–400)
RBC: 3.02 MIL/uL — ABNORMAL LOW (ref 4.22–5.81)
RDW: 21.7 % — ABNORMAL HIGH (ref 11.5–15.5)
WBC: 11.3 10*3/uL — ABNORMAL HIGH (ref 4.0–10.5)
nRBC: 0.4 % — ABNORMAL HIGH (ref 0.0–0.2)
nRBC: 2 /100 WBC — ABNORMAL HIGH

## 2019-09-12 LAB — BASIC METABOLIC PANEL
Anion gap: 7 (ref 5–15)
BUN: 9 mg/dL (ref 8–23)
CO2: 23 mmol/L (ref 22–32)
Calcium: 7.1 mg/dL — ABNORMAL LOW (ref 8.9–10.3)
Chloride: 108 mmol/L (ref 98–111)
Creatinine, Ser: 0.67 mg/dL (ref 0.61–1.24)
GFR calc Af Amer: >60 mL/min (ref >60)
GFR calc non Af Amer: >60 mL/min (ref >60)
Glucose, Bld: 122 mg/dL — ABNORMAL HIGH (ref 70–99)
Potassium: 3.3 mmol/L — ABNORMAL LOW (ref 3.5–5.1)
Sodium: 138 mmol/L (ref 135–145)

## 2019-09-12 LAB — PROTIME-INR
INR: 1.7 — ABNORMAL HIGH (ref 0.8–1.2)
Prothrombin Time: 19.3 seconds — ABNORMAL HIGH (ref 11.4–15.2)

## 2019-09-12 LAB — GLUCOSE, CAPILLARY: Glucose-Capillary: 105 mg/dL — ABNORMAL HIGH (ref 70–99)

## 2019-09-12 LAB — MAGNESIUM: Magnesium: 1.4 mg/dL — ABNORMAL LOW (ref 1.7–2.4)

## 2019-09-12 MED ORDER — POTASSIUM CHLORIDE CRYS ER 20 MEQ PO TBCR
40.0000 meq | EXTENDED_RELEASE_TABLET | Freq: Once | ORAL | Status: AC
  Administered 2019-09-12: 40 meq via ORAL
  Filled 2019-09-12: qty 2

## 2019-09-12 MED ORDER — PANTOPRAZOLE SODIUM 40 MG PO TBEC: 40.0000 mg | DELAYED_RELEASE_TABLET | Freq: Every day | ORAL | 2 refills | Status: AC

## 2019-09-12 MED ORDER — WARFARIN SODIUM 2.5 MG PO TABS: 2.5000 mg | ORAL_TABLET | ORAL | Status: DC

## 2019-09-12 MED ORDER — PANTOPRAZOLE SODIUM 40 MG PO TBEC
40.0000 mg | DELAYED_RELEASE_TABLET | Freq: Every day | ORAL | Status: DC
  Administered 2019-09-13: 40 mg via ORAL
  Filled 2019-09-12: qty 1

## 2019-09-12 MED ORDER — MAGNESIUM SULFATE 4 GM/100ML IV SOLN
4.0000 g | Freq: Once | INTRAVENOUS | Status: AC
  Administered 2019-09-12: 4 g via INTRAVENOUS
  Filled 2019-09-12: qty 100

## 2019-09-12 NOTE — Evaluation (Signed)
Occupational Therapy Evaluation Patient Details Name: [name] MRN: [id number] DOB: [birth year] Today's Date: 09/12/2019    History of Present Illness Pt is an 79 y/o male admitted secodnary to generalized weakness, loss of appetite, palpitations and feeling of cold since 2 to 3 weeks. Found to have a GIB. PMH including but not limited to hypertension, history of seizure disorder, stroke, paroxysmal atrial fib flutter/fibrillation, GERD.   Clinical Impression   No family available to determine baseline cognition. Question pt's ability to manage IADL at home. Recommending HHOT. Pt guarded and not forthcoming when therapist inquired about his PLOF. He is requesting assistance in his home "for a couple of days," but could not state what why. Pt to d/c today, will defer further OT to [facility].    Follow Up Recommendations  Home health OT    Equipment Recommendations  None recommended by OT    Recommendations for Other Services       Precautions / Restrictions Precautions Precautions: Fall      Mobility Bed Mobility Overal bed mobility: Needs Assistance Bed Mobility: Supine to Sit;Sit to Supine     Supine to sit: Supervision Sit to supine: Supervision   General bed mobility comments: increased time and effort, supervision for safety  Transfers Overall transfer level: Needs assistance Equipment used: Rolling walker (2 wheeled) Transfers: Sit to/from Stand Sit to Stand: Min guard         General transfer comment: cueing for technique and safety, min guard for stability    Balance Overall balance assessment: Needs assistance   Sitting balance-Leahy Scale: Good       Standing balance-Leahy Scale: Poor                             ADL either performed or assessed with clinical judgement   ADL Overall ADL's : Needs assistance/impaired Eating/Feeding: Independent;Bed level   Grooming: Min guard;Wash/dry hands;Standing   Upper Body Bathing: Set  up;Sitting   Lower Body Bathing: Min guard;Sit to/from stand   Upper Body Dressing : Set up;Sitting   Lower Body Dressing: Min guard;Sit to/from stand   Toilet Transfer: Min guard;Ambulation;RW   Toileting- Clothing Manipulation and Hygiene: Minimal assistance;Sit to/from stand       Functional mobility during ADLs: Min guard;Rolling walker General ADL Comments: Questionable how pt manages IADL at home, recommend HHOT.     Vision Patient Visual Report: No change from baseline       Perception     Praxis      Pertinent Vitals/Pain Pain Assessment: No/denies pain     Hand Dominance Right   Extremity/Trunk Assessment Upper Extremity Assessment Upper Extremity Assessment: Overall WFL for tasks assessed   Lower Extremity Assessment Lower Extremity Assessment: Defer to PT evaluation   Cervical / Trunk Assessment Cervical / Trunk Assessment: Kyphotic   Communication Communication Communication: HOH   Cognition Arousal/Alertness: Awake/alert Behavior During Therapy: WFL for tasks assessed/performed Overall Cognitive Status: No family/caregiver present to determine baseline cognitive functioning                                 General Comments: pt with guarded answers to questions, kept asking OT for business card so he can contact for assistance at home   General Comments       Exercises     Shoulder Instructions      Home Living Family/patient expects  to be discharged to:: Private residence Living Arrangements: Alone Available Help at Discharge: Family;Available PRN/intermittently Type of Home: House Home Access: Stairs to enter [employer] of Steps: 1 Entrance Stairs-Rails: None Home Layout: Able to live on main level with bedroom/bathroom     Bathroom Shower/Tub: Tub/shower unit;Walk-in shower   Bathroom Toilet: Standard     Home Equipment: Cane - single point;Walker - 2 wheels          Prior Functioning/Environment  Level of Independence: Independent with assistive device(s)        Comments: reports typically uses RW, performs own ADLs        OT Problem List: Decreased safety awareness;Decreased cognition;Decreased knowledge of use of DME or AE;Impaired balance (sitting and/or standing)      OT Treatment/Interventions:      OT Goals(Current goals can be found in the care plan section) Acute Rehab OT Goals Patient Stated Goal: to get home soon OT Goal Formulation: With patient  OT Frequency:     Barriers to D/C:            Co-evaluation              AM-PAC OT "6 Clicks" Daily Activity     Outcome Measure Help from another person eating meals?: None Help from another person taking care of personal grooming?: A Little Help from another person toileting, which includes using toliet, bedpan, or urinal?: A Little Help from another person bathing (including washing, rinsing, drying)?: A Little Help from another person to put on and taking off regular upper body clothing?: None Help from another person to put on and taking off regular lower body clothing?: A Little 6 Click Score: 20   End of Session Equipment Utilized During Treatment: Gait belt;Rolling walker  Activity Tolerance: Patient tolerated treatment well Patient left: in bed;with call bell/phone within reach;with bed alarm set  OT Visit Diagnosis: Other abnormalities of gait and mobility (R26.89);Other symptoms and signs involving cognitive function                Time: 1420-1433 OT Time Calculation (min): 13 min Charges:  OT General Charges $OT Visit: 1 Visit OT Evaluation $OT Eval Moderate Complexity: 1 Mod  [name], OTR/L Acute Rehabilitation Services Pager: [phone] Office: [phone]  Malka So 09/12/2019, 3:06 PM

## 2019-09-12 NOTE — Care Management Important Message (Signed)
Important Message  Patient Details  Name: [name] MRN: [id number] Date of Birth: May 12, 1940   Medicare Important Message Given:  Yes   Patient left prior to IM delivery.  IM mailed to the patient address.   Rafal Archuleta 09/12/2019, 3:35 PM

## 2019-09-12 NOTE — Plan of Care (Signed)
Continue to monitor

## 2019-09-12 NOTE — TOC Transition Note (Signed)
Transition of Care [facility]) - CM/SW Discharge Note Marvetta Gibbons RN, BSN Transitions of Care Unit 4E- RN Case Manager [phone]   Patient Details  Name: [name] MRN: [id number] Date of Birth: 12-06-1940  Transition of Care [name]) CM/SW Contact:  Dawayne Patricia, RN Phone Number: 09/12/2019, 4:51 PM   Clinical Narrative:    Pt stable for transition home today, orders placed for HHPT/OT, spoke with pt at bedside- list provided for choice Per CMS guidelines from medicare.gov website with star ratings (copy placed in shadow chart)- however pt asked that CM return as pt did not know he was going home today- bedside RN notified CM that pt reports he will not have a ride today- call made to daughter [name]- discussed with her pt ready for discharge today- daughter lives in [place]. She will check to see if she can make arrangements to come transport pt home today vs in the am. Discussed with daughter [name] recs and supervision needs- daughter states someone does come by to assist pt at home and he has RW. Per daughter [facility] was set up with [facility] on last admit however they did not start services. She is agreeable to use them again.  Call made to Santiago Glad with [facility] for [name] needs- per Santiago Glad it is noted in their system that pt would not let them come start services however they are willing to try again- and agree to accept referral- [facility] will reach out to daughter to see if she can assist with [facility] arrangements with pt.  Daughter returned call and states she will not be able to come this evening to transport pt home- and will plan to come in AM to transport- MD notified. Daughter also request MD to call to update her on test results and answer questions that she has. Msg sent to MD with daughter's contact #.    Final next level of care: [place] Barriers to Discharge: Transportation   Patient Goals and CMS Choice Patient states their goals for this hospitalization and ongoing  recovery are:: return home CMS Medicare.gov Compare Post Acute Care list provided to:: Patient Represenative (must comment) Choice offered to / list presented to : Adult Children (daughter)  Discharge Placement               Home with [facility]        Discharge Plan and Services   Discharge Planning Services: CM Consult Post Acute Care Choice: Home Health          DME Arranged: N/A         HH Arranged: PT, OT [place] Agency: [place] ([place]) Date [place]: 09/12/19 Time [place]: 1600 Representative spoke with at [place]: [place] ([place]) Interventions     Readmission Risk Interventions Readmission Risk Prevention Plan 09/12/2019 07/25/2018  Transportation Screening Complete Complete  PCP or Specialist Appt within 5-7 Days - Complete  PCP or Specialist Appt within 3-5 Days Complete -  Home Care Screening - Complete  Medication Review (RN CM) - Complete  HRI or Home Care Consult Complete -  Social Work Consult for [place] Planning/Counseling Complete -  Palliative Care Screening Not Applicable -  Medication Review Press photographer) Complete -  Some recent data might be hidden

## 2019-09-12 NOTE — Evaluation (Signed)
Physical Therapy Evaluation Patient Details Name: [name] MRN: [id number] DOB: [birth year] Today's Date: 09/12/2019   History of Present Illness  Pt is an 79 y/o male admitted secodnary to generalized weakness, loss of appetite, palpitations and feeling of cold since 2 to 3 weeks. Found to have a GIB. PMH including but not limited to hypertension, history of seizure disorder, stroke, paroxysmal atrial fib flutter/fibrillation, GERD.    Clinical Impression  Pt presented supine in bed with HOB elevated, awake and willing to participate in therapy session. Prior to admission, pt reported that he ambulated with use of a either a RW or cane and was independent with ADLs. Pt lives alone but has someone there with him intermittently, "it's 60-40". At the time of evaluation, pt overall at a min guard to min A level with all functional mobility with use of a RW. He tolerated short distance ambulation in hallway, requiring occasional min A for balance and cueing for improved step length with R LE. All VSS throughout. Pt would continue to benefit from skilled physical therapy services at this time while admitted and after d/c to address the below listed limitations in order to improve overall safety and independence with functional mobility.     Follow Up Recommendations Home health PT;Supervision/Assistance - 24 hour    Equipment Recommendations  None recommended by PT    Recommendations for Other Services       Precautions / Restrictions Precautions Precautions: Fall Restrictions Weight Bearing Restrictions: No      Mobility  Bed Mobility Overal bed mobility: Needs Assistance Bed Mobility: Supine to Sit;Sit to Supine     Supine to sit: Supervision Sit to supine: Supervision   General bed mobility comments: increased time and effort, supervision for safety  Transfers Overall transfer level: Needs assistance Equipment used: Rolling walker (2 wheeled) Transfers: Sit to/from  Stand Sit to Stand: Min guard         General transfer comment: cueing for technique and safety, min guard for stability  Ambulation/Gait Ambulation/Gait assistance: Min guard;Min assist Gait Distance (Feet): 60 Feet Assistive device: Rolling walker (2 wheeled) Gait Pattern/deviations: Step-to pattern;Step-through pattern;Decreased step length - right;Decreased step length - left;Decreased stance time - right;Decreased stride length;Decreased weight shift to right Gait velocity: decreased   General Gait Details: pt with mild instability and required cueing for improved step length and foot clearance on R LE during swing phase. Pt also with some genu recurvatum on the R in stance phase.   Stairs            Wheelchair Mobility    Modified Rankin (Stroke Patients Only)       Balance Overall balance assessment: Needs assistance Sitting-balance support: Feet supported Sitting balance-Leahy Scale: Good     Standing balance support: During functional activity;Single extremity supported;Bilateral upper extremity supported Standing balance-Leahy Scale: Poor                               Pertinent Vitals/Pain Pain Assessment: No/denies pain    Home Living Family/patient expects to be discharged to:: Private residence Living Arrangements: Alone Available Help at Discharge: Family;Available PRN/intermittently Type of Home: House Home Access: Stairs to enter Entrance Stairs-Rails: None Entrance Stairs-Number of Steps: 1 Home Layout: Able to live on main level with bedroom/bathroom Home Equipment: Cane - single point;Walker - 2 wheels      Prior Function Level of Independence: Independent with assistive device(s)  Comments: reports typically uses RW, performs own ADLs     Hand Dominance        Extremity/Trunk Assessment   Upper Extremity Assessment Upper Extremity Assessment: Defer to OT evaluation;Generalized weakness    Lower  Extremity Assessment Lower Extremity Assessment: Generalized weakness    Cervical / Trunk Assessment Cervical / Trunk Assessment: Kyphotic  Communication   Communication: HOH  Cognition Arousal/Alertness: Awake/alert Behavior During Therapy: WFL for tasks assessed/performed Overall Cognitive Status: Impaired/Different from baseline Area of Impairment: Memory;Following commands;Safety/judgement;Awareness;Problem solving                     Memory: Decreased short-term memory Following Commands: Follows one step commands with increased time;Follows one step commands inconsistently Safety/Judgement: Decreased awareness of deficits;Decreased awareness of safety Awareness: Intellectual Problem Solving: Difficulty sequencing;Requires verbal cues        General Comments      Exercises     Assessment/Plan    PT Assessment Patient needs continued PT services  PT Problem List Decreased strength;Decreased balance;Decreased coordination;Decreased mobility;Decreased cognition;Decreased knowledge of use of DME;Decreased safety awareness;Decreased knowledge of precautions       PT Treatment Interventions DME instruction;Gait training;Functional mobility training;Stair training;Therapeutic activities;Therapeutic exercise;Balance training;Neuromuscular re-education;Patient/family education    PT Goals (Current goals can be found in the Care Plan section)  Acute Rehab PT Goals Patient Stated Goal: to get home soon PT Goal Formulation: With patient Time For Goal Achievement: 09/26/19 Potential to Achieve Goals: Good    Frequency Min 3X/week   Barriers to discharge        Co-evaluation               AM-PAC PT "6 Clicks" Mobility  Outcome Measure Help needed turning from your back to your side while in a flat bed without using bedrails?: None Help needed moving from lying on your back to sitting on the side of a flat bed without using bedrails?: None Help needed  moving to and from a bed to a chair (including a wheelchair)?: A Little Help needed standing up from a chair using your arms (e.g., wheelchair or bedside chair)?: None Help needed to walk in hospital room?: A Little Help needed climbing 3-5 steps with a railing? : A Lot 6 Click Score: 20    End of Session Equipment Utilized During Treatment: Gait belt Activity Tolerance: Patient limited by fatigue Patient left: in bed;with call bell/phone within reach;with bed alarm set Nurse Communication: Mobility status PT Visit Diagnosis: Other abnormalities of gait and mobility (R26.89)    Time: 3338-3291 PT Time Calculation (min) (ACUTE ONLY): 21 min   Charges:   PT Evaluation $PT Eval Moderate Complexity: 1 Mod          Eduard Clos, PT, DPT  Acute Rehabilitation Services Pager [phone] Office [place] 09/12/2019, 11:59 AM

## 2019-09-12 NOTE — Discharge Summary (Signed)
Physician Discharge Summary  [name] Defelice OXB:[id number] DOB: [birth year] DOA: 09/09/2019  PCP: Brent Manes, MD  Admit date: 09/09/2019 Discharge date: 09/12/2019  Time spent: 50 minutes  Recommendations for Outpatient Follow-up:  1. Follow-up with Brent Manes, MD in 1 week.  On follow-up patient will need INR check, CBC to follow-up on H&H, basic metabolic profile and magnesium level to follow-up on electrolytes and renal function.    Discharge Diagnoses:  Principal Problem:   GI bleed Active Problems:   Acute blood loss anemia   Essential hypertension   Seizure disorder (HCC)   Paroxysmal atrial flutter (HCC)   Severe protein-calorie malnutrition (HCC)   GI bleeding   Hypokalemia   Secondary hypercoagulable state ([place])   Hypomagnesemia   Discharge Condition: Stable and improved  Diet recommendation: Heart healthy  Filed Weights   09/09/19 1415 09/09/19 1936  Weight: 61.2 kg 55.1 kg    History of present illness:  HPI per [name] is a 79 y.o. male with medical history significant of essential hypertension, history of seizure disorder, stroke, paroxysmal atrial fib flutter/fibrillation, GERD was brought in by her daughter for weakness associated with some palpitations and constant feeling of cold and chills.  Patient lives by himself and he reports that he has been feeling cold the last 2 weeks, reports constipation denies any rectal bleed or melena.  Patient denies any nausea or vomiting, chest pain, fever shortness of breath, cough, headache, dizziness, syncopal episodes or any dysuria.  ED Course: On arrival to ED lab work revealed WBC count of 12.3, hemoglobin of 4.9, hematocrit of 16.9, platelets of 476, NR of 9.3, PT of 73, phenytoin of 9.  His stool for occult blood was positive.  COVID-19 screening test is negative.  EKG shows sinus tachycardia. Chest x-ray-showed retrocardiac opacity. 2 units of PRBC transfusion ordered and 1 dose  of IV vitamin K 10 mg given. GI consulted. And he was referred to [facility] for admission for evaluation of anemia and GI bleed.  Hospital Course:  1 GI bleed/acute blood loss anemia Patient admitted with generalized weakness, some palpitations and constant feeling of cold and chills.  Patient noted to be living by himself and reported feeling cold past 2 weeks prior to admission.  Admission labs noted patient to have a hemoglobin of 4.9.  FOBT was positive.  EKG showed a sinus tachycardia.  Patient received a dose of IV vitamin K 10 mg x 1 as patient noted to be chronically on Coumadin.  Patient transfused a total of 3 units packed red blood cells during the hospitalization.  Hemoglobin responded appropriately.  GI was consulted and patient underwent upper endoscopy on 09/11/2019 which showed a angiodysplasia in the duodenum status post APC.  Patient also maintained on a PPI during the hospitalization.  GI recommended to hold Coumadin 3 days post treatment.  Patient improved clinically.  Patient's hemoglobin by day of discharge was 8.7.  Patient be discharged home in stable and improved condition with close outpatient follow-up with PCP.  2.  Hypomagnesemia/hypokalemia Repleted.  Outpatient follow-up.  3.  Seizure disorder Remained stable throughout the hospitalization.  Patient maintained on home regimen of Keppra and Dilantin.  Outpatient follow-up.  4.  Paroxysmal atrial fibrillation on chronic Coumadin [id number] score > 3.  2D echo noted to have a EF of 60 to 65%.  Patient's Coumadin was held on presentation secondary to problem #1.  Patient rate remained controlled during the hospitalization.  GI recommended resumption of  Coumadin 3 days post APC.  Patient's Coumadin will be resumed on 09/15/2019.  Patient will need follow-up INR checked by PCP in 1 week.  5.  Gastroesophageal reflux disease Remained stable.  Patient maintained on a PPI.  6.  Hypertension Stable.   7.  Severe protein calorie  malnutrition Patient maintained on nutritional supplementation.  Procedures:  Chest x-ray 09/09/2019  Upper endoscopy 09/11/2019 per Dr. [name]  Transfuse 3 units packed red blood cells 09/09/2019-09/10/2019  Consultations:  Gastroenterology: Dr. [name] 09/10/2019  Discharge Exam: Vitals:   09/12/19 0832 09/12/19 1200  BP: 123/73 (!) 103/57  Pulse: 81 76  Resp: 19 19  Temp: 99.7 F (37.6 C) 99 F (37.2 C)  SpO2: 100% 97%    General: NAD Cardiovascular: RRR Respiratory: CTAB  Discharge Instructions   Discharge Instructions    Diet - low sodium heart healthy   Complete by: As directed    Increase activity slowly   Complete by: As directed      Allergies as of 09/12/2019   No Known Allergies     Medication List    TAKE these medications   amiodarone 200 MG tablet Commonly known as: PACERONE Take 1 tablet (200 mg total) by mouth daily. What changed: when to take this   feeding supplement (ENSURE ENLIVE) Liqd Take 237 mLs by mouth 3 (three) times daily between meals.   ferrous sulfate 325 (65 FE) MG tablet Take 1 tablet (325 mg total) by mouth 3 (three) times daily with meals. What changed: when to take this   folic acid 1 MG tablet Commonly known as: FOLVITE Take 1 tablet (1 mg total) by mouth daily.   levETIRAcetam 500 MG tablet Commonly known as: KEPPRA TAKE 3 TABLETS BY MOUTH TWICE A DAY What changed:   how much to take  how to take this  when to take this  additional instructions   multivitamin with minerals Tabs tablet Take 1 tablet by mouth daily.   pantoprazole 40 MG tablet Commonly known as: PROTONIX Take 1 tablet (40 mg total) by mouth daily at 6 (six) AM. Start taking on: September 13, 2019 What changed: when to take this   phenytoin 200 MG ER capsule Commonly known as: DILANTIN Take 1 capsule (200 mg total) by mouth at bedtime. What changed: when to take this   polyethylene glycol 17 g packet Commonly known as: MIRALAX /  GLYCOLAX Take 17 g by mouth daily as needed for mild constipation.   warfarin 2.5 MG tablet Commonly known as: COUMADIN Take as directed. If you are unsure how to take this medication, talk to your nurse or doctor. Original instructions: Take 1-2 tablets (2.5-5 mg total) by mouth See admin instructions. Take 2.5mg  on Monday and Friday, and 5mg  on Sunday, Tuesday, Wednesday, Thursday, and Saturday. TAKE AS DIRECTED BY COUMADIN CLINIC. Take at 6pm. Start taking on: September 15, 2019 What changed:   how much to take  additional instructions  These instructions start on September 15, 2019. If you are unsure what to do until then, ask your doctor or other care provider.      No Known Allergies  Follow-up Information    Stoneking, Hal, MD. Schedule an appointment as soon as possible for a visit in 1 week(s).   Specialty: Internal Medicine Contact information: [address]  [place] [address] [phone]                The results of significant diagnostics from this hospitalization (  including imaging, microbiology, ancillary and laboratory) are listed below for reference.    Significant Diagnostic Studies: DG Chest Portable 1 View  Result Date: 09/09/2019 CLINICAL DATA:  Palpitations with weakness and fatigue. EXAM: PORTABLE CHEST 1 VIEW COMPARISON:  07/25/2019 FINDINGS: Lungs are somewhat hyperexpanded with subtle hazy density over the left base/retrocardiac region with significant interval improvement likely minimal residual left effusion/atelectasis. Right lung is clear. Increased lucency over the mid to upper lung suggesting emphysematous disease. Cardiomediastinal silhouette and remainder of the exam is unchanged. IMPRESSION: 1. Significant interval improvement left base/retrocardiac density likely small effusion/atelectasis. 2.  Suggestion of emphysematous disease. Electronically Signed   By: Marin Olp [name]   On: 09/09/2019 14:57    Microbiology: Recent  Results (from the past 240 hour(s))  Blood culture (routine x 2)     Status: None (Preliminary result)   Collection Time: 09/09/19  2:20 PM   Specimen: BLOOD RIGHT FOREARM  Result Value Ref Range Status   Specimen Description BLOOD RIGHT FOREARM  Final   Special Requests   Final    BOTTLES DRAWN AEROBIC AND ANAEROBIC Blood Culture adequate volume   Culture   Final    NO GROWTH 3 DAYS Performed at [facility], [place] [address], [place], [place] [address]    Report Status PENDING  Incomplete  Blood culture (routine x 2)     Status: None (Preliminary result)   Collection Time: 09/09/19  2:25 PM   Specimen: BLOOD LEFT FOREARM  Result Value Ref Range Status   Specimen Description BLOOD LEFT FOREARM  Final   Special Requests   Final    BOTTLES DRAWN AEROBIC ONLY Blood Culture results may not be optimal due to an inadequate volume of blood received in culture bottles   Culture   Final    NO GROWTH 3 DAYS Performed at [facility], [place] [address] [place], [place] [address]    Report Status PENDING  Incomplete  SARS Coronavirus 2 by RT PCR (hospital order, performed in [facility] hospital lab) Nasopharyngeal Nasopharyngeal Swab     Status: None   Collection Time: 09/09/19  3:31 PM   Specimen: Nasopharyngeal Swab  Result Value Ref Range Status   SARS Coronavirus 2 NEGATIVE NEGATIVE Final    Comment: (NOTE) SARS-CoV-2 target nucleic acids are NOT DETECTED.  The SARS-CoV-2 RNA is generally detectable in upper and lower respiratory specimens during the acute phase of infection. The lowest concentration of SARS-CoV-2 viral copies this assay can detect is 250 copies / mL. A negative result does not preclude SARS-CoV-2 infection and should not be used as the sole basis for treatment or other patient management decisions.  A negative result may occur with improper specimen collection / handling, submission of specimen other than nasopharyngeal swab, presence of viral mutation(s)  within the areas targeted by this assay, and inadequate number of viral copies (<250 copies / mL). A negative result must be combined with clinical observations, patient history, and epidemiological information.  Fact Sheet for Patients:   StrictlyIdeas.no  Fact Sheet for Healthcare Providers: BankingDealers.co.za  This test is not yet approved or  cleared by the [place] FDA and has been authorized for detection and/or diagnosis of SARS-CoV-2 by FDA under an Emergency Use Authorization (EUA).  This EUA will remain in effect (meaning this test can be used) for the duration of the COVID-19 declaration under Section 564(b)(1) of the Act, 21 U.S.C. section 360bbb-3(b)(1), unless the authorization is terminated or revoked sooner.  Performed at [facility], [place] [address], [place], [place] [address]   Urine culture     Status: None   Collection Time: 09/09/19  9:06 PM   Specimen: Urine, Random  Result Value Ref Range Status   Specimen Description URINE, RANDOM  Final   Special Requests NONE  Final   Culture   Final    NO GROWTH Performed at [facility], [place] [address] [place], [place] [address]    Report Status 09/11/2019 FINAL  Final     Labs: Basic Metabolic Panel: Recent Labs  Lab 09/09/19 1410 09/10/19 0257 09/11/19 0350 09/12/19 0912  NA 138 140 138 138  K 3.7 3.5 3.1* 3.3*  CL 106 109 108 108  CO2 23 24 22 23   GLUCOSE 193* 99 92 122*  BUN 29* 21 11 9   CREATININE 0.98 0.81 0.63 0.67  CALCIUM 8.3* 7.4* 7.0* 7.1*  MG  --   --   --  1.4*   Liver Function Tests: Recent Labs  Lab 09/09/19 1410  AST 23  ALT 17  ALKPHOS 54  BILITOT 0.5  PROT 6.0*  ALBUMIN 3.2*   No results for input(s): LIPASE, AMYLASE in the last 168 hours. No results for input(s): AMMONIA in the last 168 hours. CBC: Recent Labs  Lab 09/09/19 1410 09/10/19 0257 09/10/19 1543 09/11/19 0350 09/12/19 0912  WBC 12.3*  --    --  9.1 11.3*  NEUTROABS 9.2*  --   --  6.6 10.7*  HGB 4.9* 6.1* 7.3* 7.5* 8.7*  HCT 16.9* 18.6* 22.5* 22.5* 27.0*  MCV 100.0  --   --  87.5 89.4  PLT 476*  --   --  273 328   Cardiac Enzymes: No results for input(s): CKTOTAL, CKMB, CKMBINDEX, TROPONINI in the last 168 hours. BNP: BNP (last 3 results) No results for input(s): BNP in the last 8760 hours.  ProBNP (last 3 results) No results for input(s): PROBNP in the last 8760 hours.  CBG: Recent Labs  Lab 09/09/19 1425  GLUCAP 182*       Signed:  Irine Seal MD.  [facility] 09/12/2019, 2:21 PM

## 2019-09-13 ENCOUNTER — Encounter (HOSPITAL_COMMUNITY): Payer: Self-pay | Admitting: Gastroenterology

## 2019-09-13 LAB — CBC WITH DIFFERENTIAL/PLATELET
Abs Immature Granulocytes: 0.1 10*3/uL — ABNORMAL HIGH (ref 0.00–0.07)
Basophils Absolute: 0.1 10*3/uL (ref 0.0–0.1)
Basophils Relative: 1 %
Eosinophils Absolute: 0.2 10*3/uL (ref 0.0–0.5)
Eosinophils Relative: 3 %
HCT: 24.6 % — ABNORMAL LOW (ref 39.0–52.0)
Hemoglobin: 7.7 g/dL — ABNORMAL LOW (ref 13.0–17.0)
Lymphocytes Relative: 6 %
Lymphs Abs: 0.4 10*3/uL — ABNORMAL LOW (ref 0.7–4.0)
MCH: 28.7 pg (ref 26.0–34.0)
MCHC: 31.3 g/dL (ref 30.0–36.0)
MCV: 91.8 fL (ref 80.0–100.0)
Monocytes Absolute: 0.2 10*3/uL (ref 0.1–1.0)
Monocytes Relative: 3 %
Myelocytes: 2 %
Neutro Abs: 5.6 10*3/uL (ref 1.7–7.7)
Neutrophils Relative %: 85 %
Platelets: 273 10*3/uL (ref 150–400)
RBC: 2.68 MIL/uL — ABNORMAL LOW (ref 4.22–5.81)
RDW: 21.3 % — ABNORMAL HIGH (ref 11.5–15.5)
WBC: 6.6 10*3/uL (ref 4.0–10.5)
nRBC: 0.5 % — ABNORMAL HIGH (ref 0.0–0.2)
nRBC: 2 /100 WBC — ABNORMAL HIGH

## 2019-09-13 LAB — GLUCOSE, CAPILLARY: Glucose-Capillary: 105 mg/dL — ABNORMAL HIGH (ref 70–99)

## 2019-09-13 LAB — BASIC METABOLIC PANEL
Anion gap: 7 (ref 5–15)
BUN: 12 mg/dL (ref 8–23)
CO2: 23 mmol/L (ref 22–32)
Calcium: 7.4 mg/dL — ABNORMAL LOW (ref 8.9–10.3)
Chloride: 109 mmol/L (ref 98–111)
Creatinine, Ser: 0.66 mg/dL (ref 0.61–1.24)
GFR calc Af Amer: >60 mL/min (ref >60)
GFR calc non Af Amer: >60 mL/min (ref >60)
Glucose, Bld: 106 mg/dL — ABNORMAL HIGH (ref 70–99)
Potassium: 3.9 mmol/L (ref 3.5–5.1)
Sodium: 139 mmol/L (ref 135–145)

## 2019-09-13 LAB — PROTIME-INR
INR: 1.7 — ABNORMAL HIGH (ref 0.8–1.2)
Prothrombin Time: 18.9 seconds — ABNORMAL HIGH (ref 11.4–15.2)

## 2019-09-13 LAB — MAGNESIUM: Magnesium: 1.9 mg/dL (ref 1.7–2.4)

## 2019-09-13 MED ORDER — MAGNESIUM SULFATE 2 GM/50ML IV SOLN
2.0000 g | Freq: Once | INTRAVENOUS | Status: AC
  Administered 2019-09-13: 2 g via INTRAVENOUS
  Filled 2019-09-13: qty 50

## 2019-09-13 NOTE — Progress Notes (Addendum)
IV and telemetry discontinued at this time. CCMD notified.  [name], daughter notified of discharge. She states that his transportation will be here around 1330.

## 2019-09-13 NOTE — Plan of Care (Signed)
Continue to monitor

## 2019-09-13 NOTE — Progress Notes (Addendum)
PROGRESS NOTE    [name]  GDJ:[id number] DOB: 10/27/1940 DOA: 09/09/2019 PCP: [name], MD    Chief Complaint  Patient presents with  . Weakness    Brief Narrative:  [name] a 79 y.o.malewith medical history significant ofessential hypertension, history of seizure disorder, stroke, paroxysmal atrial fib flutter/fibrillation, GERD was brought in by her daughter for weakness associated with some palpitations and constant feeling of cold and chills. Patient lives by himself and he reports that he has been feeling cold the last 2 weeks, reports constipation denies any rectal bleed or melena. On arrival to ED lab work revealed WBC count of 12.3, hemoglobin of 4.9, hematocrit of 16.9, platelets of 476, NR of 9.3, PT of 73, phenytoin of 9. His stool for occult blood was positive.  He was admitted for evaluation of GI bleed.  A total of 3 units of PRBC transfused and his hemoglobin is 7.3.  GI consulted and he is underwent EGD today showing Three angiodysplastic lesions in the duodenum. It was treated with argon plasma coagulation. Rest of the stomach was wnl.  Regular diet resumed, hold coumadin for 3 days post  APC.    Assessment & Plan:   Principal Problem:   GI bleed Active Problems:   Acute blood loss anemia   Essential hypertension   Seizure disorder (HCC)   Paroxysmal atrial flutter (HCC)   Severe protein-calorie malnutrition (HCC)   GI bleeding   Hypokalemia   Secondary hypercoagulable state ([place])   Hypomagnesemia  1 GI bleed/acute blood loss anemia Patient admitted with generalized weakness, some palpitations and constant feeling of cold and chills.  Patient noted to be living by himself and reported feeling cold past 2 weeks prior to admission.  Admission labs noted patient to have a hemoglobin of 4.9.  FOBT was positive.  EKG showed a sinus tachycardia.  Patient received a dose of IV vitamin K 10 mg x 1 as patient noted to be chronically on  Coumadin.  Patient transfused a total of 3 units packed red blood cells during the hospitalization.  Hemoglobin responded appropriately.  GI was consulted and patient underwent upper endoscopy on 09/11/2019 which showed a angiodysplasia in the duodenum status post APC.    Continue PPI. GI recommended to hold Coumadin 3 days post treatment.    Improving clinically.  Tolerating current diet.  Stable at 7.7.  Outpatient follow-up with PCP.    2.  Hypomagnesemia/hypokalemia Potassium at 3.9.  Magnesium at 1.9.  Magnesium 2 g IV x1.  Outpatient follow-up with PCP.  3.  Seizure disorder Stable.  No noted seizures.  Continue Dilantin and Keppra.  Outpatient follow-up.    4.  Paroxysmal atrial fibrillation on chronic Coumadin [id number] score > 3.  2D echo noted to have a EF of 60 to 65%.  Patient's Coumadin was held on presentation secondary to problem #1.  Patient rate remained controlled during the hospitalization.  GI recommended resumption of Coumadin 3 days post APC.  Patient's Coumadin will be resumed on 09/15/2019.  Outpatient follow-up with PCP/Coumadin clinic.   5.  Gastroesophageal reflux disease Continue PPI.    6.  Hypertension Stable.   7.  Severe protein calorie malnutrition Patient maintained on nutritional supplementation.    DVT prophylaxis: SCDs Code Status: Full Family Communication: Updated patient. Updated daughter [name] via telephone. Disposition:   Status is: Inpatient    Dispo: The patient is from: Home  Anticipated d/c is to: Home with home health              Anticipated d/c date is: Today              Patient currently medically stable for discharge.       Consultants:   Gastroenterology: Dr. [name] 09/10/2019  Procedures:   Chest x-ray 09/09/2019  Upper endoscopy 09/11/2019 per Dr. [name]  Transfuse 3 units packed red blood cells 09/09/2019-09/10/2019  Antimicrobials:   None   Subjective: Sleeping but arousable.  Denies any  chest pain or shortness of breath.  Tolerating current diet.  Objective: Vitals:   09/12/19 2339 09/13/19 0519 09/13/19 0720 09/13/19 1110  BP: 109/61 (!) 100/55 (!) 105/59 116/62  Pulse: 83 84 83 92  Resp: 20 14 17 17   Temp: 98.7 F (37.1 C) 98 F (36.7 C) 98.7 F (37.1 C) 98.4 F (36.9 C)  TempSrc: Oral Oral Oral Oral  SpO2: 95% 98%  97%  Weight:      Height:        Intake/Output Summary (Last 24 hours) at 09/13/2019 1114 Last data filed at 09/13/2019 1032 Gross per 24 hour  Intake 834.68 ml  Output 1200 ml  Net -365.32 ml   Filed Weights   09/09/19 1415 09/09/19 1936  Weight: 61.2 kg 55.1 kg    Examination:  General exam: NAD Respiratory system: CTAB.  No wheezes, no crackles, no rhonchi.  Normal respiratory effort.  Speaking in full sentences. Cardiovascular system: Regular rate rhythm no murmurs rubs or gallops.  No JVD.  No lower extremity edema.  Gastrointestinal system: Abdomen is soft, nontender, nondistended, positive bowel sounds.  No rebound.  No guarding. Central nervous system: Alert and oriented. No focal neurological deficits. Extremities: Symmetric 5 x 5 power. Skin: No rashes, lesions or ulcers Psychiatry: Judgement and insight appear normal. Mood & affect appropriate.     Data Reviewed: I have personally reviewed following labs and imaging studies  CBC: Recent Labs  Lab 09/09/19 1410 09/09/19 1410 09/10/19 0257 09/10/19 1543 09/11/19 0350 09/12/19 0912 09/13/19 0814  WBC 12.3*  --   --   --  9.1 11.3* 6.6  NEUTROABS 9.2*  --   --   --  6.6 10.7* 5.6  HGB 4.9*   < > 6.1* 7.3* 7.5* 8.7* 7.7*  HCT 16.9*   < > 18.6* 22.5* 22.5* 27.0* 24.6*  MCV 100.0  --   --   --  87.5 89.4 91.8  PLT 476*  --   --   --  273 328 273   < > = values in this interval not displayed.    Basic Metabolic Panel: Recent Labs  Lab 09/09/19 1410 09/10/19 0257 09/11/19 0350 09/12/19 0912 09/13/19 0400  NA 138 140 138 138 139  K 3.7 3.5 3.1* 3.3* 3.9  CL 106  109 108 108 109  CO2 23 24 22 23 23   GLUCOSE 193* 99 92 122* 106*  BUN 29* 21 11 9 12   CREATININE 0.98 0.81 0.63 0.67 0.66  CALCIUM 8.3* 7.4* 7.0* 7.1* 7.4*  MG  --   --   --  1.4* 1.9    GFR: Estimated Creatinine Clearance: 57.4 mL/min (by C-G formula based on SCr of 0.66 mg/dL).  Liver Function Tests: Recent Labs  Lab 09/09/19 1410  AST 23  ALT 17  ALKPHOS 54  BILITOT 0.5  PROT 6.0*  ALBUMIN 3.2*    CBG: Recent Labs  Lab 09/09/19 1425  09/12/19 1804 09/13/19 0632  GLUCAP 182* 105* 105*     Recent Results (from the past 240 hour(s))  Blood culture (routine x 2)     Status: None (Preliminary result)   Collection Time: 09/09/19  2:20 PM   Specimen: BLOOD RIGHT FOREARM  Result Value Ref Range Status   Specimen Description BLOOD RIGHT FOREARM  Final   Special Requests   Final    BOTTLES DRAWN AEROBIC AND ANAEROBIC Blood Culture adequate volume   Culture   Final    NO GROWTH 4 DAYS Performed at [facility], [place] [address], [place], [place] [address]    Report Status PENDING  Incomplete  Blood culture (routine x 2)     Status: None (Preliminary result)   Collection Time: 09/09/19  2:25 PM   Specimen: BLOOD LEFT FOREARM  Result Value Ref Range Status   Specimen Description BLOOD LEFT FOREARM  Final   Special Requests   Final    BOTTLES DRAWN AEROBIC ONLY Blood Culture results may not be optimal due to an inadequate volume of blood received in culture bottles   Culture   Final    NO GROWTH 4 DAYS Performed at [facility], [place] [address] [place], [place] [address]    Report Status PENDING  Incomplete  SARS Coronavirus 2 by RT PCR (hospital order, performed in [facility] hospital lab) Nasopharyngeal Nasopharyngeal Swab     Status: None   Collection Time: 09/09/19  3:31 PM   Specimen: Nasopharyngeal Swab  Result Value Ref Range Status   SARS Coronavirus 2 NEGATIVE NEGATIVE Final    Comment: (NOTE) SARS-CoV-2 target nucleic acids are NOT  DETECTED.  The SARS-CoV-2 RNA is generally detectable in upper and lower respiratory specimens during the acute phase of infection. The lowest concentration of SARS-CoV-2 viral copies this assay can detect is 250 copies / mL. A negative result does not preclude SARS-CoV-2 infection and should not be used as the sole basis for treatment or other patient management decisions.  A negative result may occur with improper specimen collection / handling, submission of specimen other than nasopharyngeal swab, presence of viral mutation(s) within the areas targeted by this assay, and inadequate number of viral copies (<250 copies / mL). A negative result must be combined with clinical observations, patient history, and epidemiological information.  Fact Sheet for Patients:   StrictlyIdeas.no  Fact Sheet for Healthcare Providers: BankingDealers.co.za  This test is not yet approved or  cleared by the [place] FDA and has been authorized for detection and/or diagnosis of SARS-CoV-2 by FDA under an Emergency Use Authorization (EUA).  This EUA will remain in effect (meaning this test can be used) for the duration of the COVID-19 declaration under Section 564(b)(1) of the Act, 21 U.S.C. section 360bbb-3(b)(1), unless the authorization is terminated or revoked sooner.  Performed at [facility], [place] [address] [place], [place] [address]   Urine culture     Status: None   Collection Time: 09/09/19  9:06 PM   Specimen: Urine, Random  Result Value Ref Range Status   Specimen Description URINE, RANDOM  Final   Special Requests NONE  Final   Culture   Final    NO GROWTH Performed at [facility], [place] [address] [place], [place] [address]    Report Status 09/11/2019 FINAL  Final         Radiology Studies: No results found.      Scheduled Meds: .  amiodarone  200 mg Oral Daily  . feeding supplement (ENSURE ENLIVE)  237  mL Oral BID BM  . folic acid  1 mg Intravenous Daily  . multivitamin with minerals  1 tablet Oral Daily  . pantoprazole  40 mg Oral Q0600  . phenytoin  200 mg Oral Daily  . sodium chloride flush  3 mL Intravenous Once   Continuous Infusions: . levETIRAcetam 1,500 mg (09/13/19 1007)     LOS: 4 days    Time spent: 35 minutes    Irine Seal, MD [facility]   To contact the attending provider between 7A-7P or the covering provider during after hours 7P-7A, please log into the web site [email or web address] and access using universal [place] password for that web site. If you do not have the password, please call the hospital operator.  09/13/2019, 11:14 AM

## 2019-09-13 NOTE — Plan of Care (Signed)
Discharge to home °

## 2019-09-14 LAB — CULTURE, BLOOD (ROUTINE X 2)
Culture: NO GROWTH
Culture: NO GROWTH
Special Requests: ADEQUATE

## 2019-09-17 ENCOUNTER — Other Ambulatory Visit: Payer: Self-pay | Admitting: *Deleted

## 2019-09-17 NOTE — Patient Outreach (Signed)
[facility]) Care Management  09/17/2019  [name] [facility] January 10, 1941 [id number]   Noted that member was discharged back home on 6/24 after being hospitalized for GI bleed requiring transfusion. Call placed to daughter [name], she report he is doing better.  He has a follow up appointment with coumadin clinic of 7/6 and with PCP on 7/13.  There were no recommendations to follow up with GI.  Member is still living alone with a sitter for a few hours throughout the day.  Daughter report she is looking into moving him closer to her at an independent/assisted living facility in [place].  State he is currently open to the idea and she is trying to move forward before he change his mind.  She denies any urgent assistance at this time, will notify this care manager with any questions.  Will follow up within the next month.  [facility] CM Care Plan Problem One     Most Recent Value  Care Plan Problem One Risk for readmission related to infection as evidenced by recent admission  Role Documenting the Problem One Care Management [place] for Problem One Active  [facility] Long Term Goal  Member will not be readmitted to hospital within the next 31 days  [facility] Long Term Goal Start Date 09/17/19  [name] met but readmitted ]  Interventions for Problem One Long Term Goal Most recent discharge instructions reviewed with daughter.  Follow up appointment with PCP   [name] CM Short Term Goal #1  Daughter will report compliance with coumadin monitoring over the nexft 4 weeks  [facility] CM Short Term Goal #1 Start Date 09/17/19  Interventions for Short Term Goal #1 Schedule for coumadin follow up discussed with daughter  [facility] CM Short Term Goal #2  Daughter will report no weight loss over the next 4 weeks  [facility] CM Short Term Goal #2 Start Date 07/30/19  [facility] CM Short Term Goal #2 Met Date 09/17/19  [facility] CM Short Term Goal #3 Daughter will have plan to relocate member within the next 4 weeks  [facility] CM  Short Term Goal #3 Start Date 09/17/19  Interventions for Short [place] Goal #3 Discussed plan with daughter, provided advise on facility research.  Will place referral to CSW if needed     [name], Therapist, sports, MSN [place] [phone]

## 2019-09-21 ENCOUNTER — Ambulatory Visit: Payer: Self-pay | Admitting: *Deleted

## 2019-09-25 ENCOUNTER — Ambulatory Visit (INDEPENDENT_AMBULATORY_CARE_PROVIDER_SITE_OTHER): Payer: Medicare Other

## 2019-09-25 ENCOUNTER — Other Ambulatory Visit: Payer: Self-pay

## 2019-09-25 DIAGNOSIS — I4892 Unspecified atrial flutter: Secondary | ICD-10-CM | POA: Diagnosis not present

## 2019-09-25 DIAGNOSIS — Z5181 Encounter for therapeutic drug level monitoring: Secondary | ICD-10-CM | POA: Diagnosis not present

## 2019-09-25 LAB — POCT INR: INR: 1.7 — AB (ref 2.0–3.0)

## 2019-09-25 NOTE — Patient Instructions (Signed)
Take 3 tablets today and then continue taking Warfarin 2 tablets daily except for 1 tablet on Mondays and Fridays. Recheck INR in 3 weeks. Pt is still taking prednisone 5 mg daily. Coumadin Clinic. [phone].

## 2019-09-29 ENCOUNTER — Other Ambulatory Visit: Payer: Self-pay | Admitting: Neurology

## 2019-09-29 DIAGNOSIS — G40209 Localization-related (focal) (partial) symptomatic epilepsy and epileptic syndromes with complex partial seizures, not intractable, without status epilepticus: Secondary | ICD-10-CM

## 2019-10-01 NOTE — Telephone Encounter (Signed)
Spoke with pts daughter. He is taking Keppra 500 mg 3 tabs BID and Dilantin 100 mg 2 tabs @ HS. Refills sent in.

## 2019-10-01 NOTE — Telephone Encounter (Signed)
His daughter [name] has been involved in his care (he has some cognitive issues), can you pls call her to confirm doses of medications (Dilantin and Keppra), it looks like dose was reduced when he was in the hospital in August. Thanks

## 2019-10-02 DIAGNOSIS — I10 Essential (primary) hypertension: Secondary | ICD-10-CM | POA: Diagnosis not present

## 2019-10-02 DIAGNOSIS — M353 Polymyalgia rheumatica: Secondary | ICD-10-CM | POA: Diagnosis not present

## 2019-10-02 DIAGNOSIS — D500 Iron deficiency anemia secondary to blood loss (chronic): Secondary | ICD-10-CM | POA: Diagnosis not present

## 2019-10-02 DIAGNOSIS — Z961 Presence of intraocular lens: Secondary | ICD-10-CM | POA: Diagnosis not present

## 2019-10-02 DIAGNOSIS — E46 Unspecified protein-calorie malnutrition: Secondary | ICD-10-CM | POA: Diagnosis not present

## 2019-10-14 ENCOUNTER — Other Ambulatory Visit: Payer: Self-pay | Admitting: Internal Medicine

## 2019-10-16 ENCOUNTER — Other Ambulatory Visit: Payer: Self-pay | Admitting: *Deleted

## 2019-10-16 ENCOUNTER — Ambulatory Visit (INDEPENDENT_AMBULATORY_CARE_PROVIDER_SITE_OTHER): Payer: Medicare Other | Admitting: *Deleted

## 2019-10-16 DIAGNOSIS — I4892 Unspecified atrial flutter: Secondary | ICD-10-CM | POA: Diagnosis not present

## 2019-10-16 DIAGNOSIS — Z5181 Encounter for therapeutic drug level monitoring: Secondary | ICD-10-CM

## 2019-10-16 LAB — POCT INR: INR: 4.3 — AB (ref 2.0–3.0)

## 2019-10-16 NOTE — Patient Instructions (Addendum)
Description   Do not take any Warfarin today and tomorrow take 1 tablet then continue taking Warfarin 2 tablets daily except for 1 tablet on Mondays and Fridays. Recheck INR in 2 weeks-appt changed per [name] pt's daughter. Coumadin Clinic. [phone].

## 2019-10-16 NOTE — Patient Outreach (Signed)
[facility]) Care Management  10/16/2019  Spenser Harren [facility] 1940/10/14 [id number]   Call placed to member's daughter to follow up on management of chronic medical conditions, no answer.  HIPAA compliant voice message left, will follow up within the next 3-4 business days.  [name], [place], MSN [place] [phone]

## 2019-10-19 ENCOUNTER — Emergency Department (HOSPITAL_COMMUNITY)
Admission: EM | Admit: 2019-10-19 | Discharge: 2019-10-20 | Disposition: A | Discharge status: Home / Self Care | Payer: Medicare Other | Source: Home / Self Care | Attending: Emergency Medicine | Admitting: Emergency Medicine

## 2019-10-19 ENCOUNTER — Other Ambulatory Visit: Payer: Self-pay

## 2019-10-19 ENCOUNTER — Encounter (HOSPITAL_COMMUNITY): Payer: Self-pay | Admitting: Emergency Medicine

## 2019-10-19 ENCOUNTER — Emergency Department (HOSPITAL_COMMUNITY): Payer: Medicare Other

## 2019-10-19 ENCOUNTER — Encounter (HOSPITAL_COMMUNITY)
Admission: EM | Disposition: A | Discharge status: Home / Self Care | Payer: Self-pay | Source: Home / Self Care | Attending: Emergency Medicine

## 2019-10-19 DIAGNOSIS — Z79899 Other long term (current) drug therapy: Secondary | ICD-10-CM | POA: Insufficient documentation

## 2019-10-19 DIAGNOSIS — S40012A Contusion of left shoulder, initial encounter: Secondary | ICD-10-CM | POA: Insufficient documentation

## 2019-10-19 DIAGNOSIS — Y939 Activity, unspecified: Secondary | ICD-10-CM | POA: Insufficient documentation

## 2019-10-19 DIAGNOSIS — I213 ST elevation (STEMI) myocardial infarction of unspecified site: Secondary | ICD-10-CM | POA: Diagnosis not present

## 2019-10-19 DIAGNOSIS — I4892 Unspecified atrial flutter: Secondary | ICD-10-CM | POA: Insufficient documentation

## 2019-10-19 DIAGNOSIS — Z8673 Personal history of transient ischemic attack (TIA), and cerebral infarction without residual deficits: Secondary | ICD-10-CM | POA: Insufficient documentation

## 2019-10-19 DIAGNOSIS — Z87891 Personal history of nicotine dependence: Secondary | ICD-10-CM | POA: Insufficient documentation

## 2019-10-19 DIAGNOSIS — Z7901 Long term (current) use of anticoagulants: Secondary | ICD-10-CM | POA: Insufficient documentation

## 2019-10-19 DIAGNOSIS — R0902 Hypoxemia: Secondary | ICD-10-CM | POA: Diagnosis not present

## 2019-10-19 DIAGNOSIS — Z20822 Contact with and (suspected) exposure to covid-19: Secondary | ICD-10-CM | POA: Insufficient documentation

## 2019-10-19 DIAGNOSIS — X58XXXA Exposure to other specified factors, initial encounter: Secondary | ICD-10-CM | POA: Insufficient documentation

## 2019-10-19 DIAGNOSIS — Z0389 Encounter for observation for other suspected diseases and conditions ruled out: Secondary | ICD-10-CM | POA: Diagnosis not present

## 2019-10-19 DIAGNOSIS — R531 Weakness: Secondary | ICD-10-CM | POA: Insufficient documentation

## 2019-10-19 DIAGNOSIS — R9431 Abnormal electrocardiogram [ECG] [EKG]: Secondary | ICD-10-CM | POA: Diagnosis not present

## 2019-10-19 DIAGNOSIS — Y999 Unspecified external cause status: Secondary | ICD-10-CM | POA: Insufficient documentation

## 2019-10-19 DIAGNOSIS — I10 Essential (primary) hypertension: Secondary | ICD-10-CM | POA: Insufficient documentation

## 2019-10-19 DIAGNOSIS — R55 Syncope and collapse: Secondary | ICD-10-CM | POA: Diagnosis not present

## 2019-10-19 DIAGNOSIS — S4992XA Unspecified injury of left shoulder and upper arm, initial encounter: Secondary | ICD-10-CM | POA: Diagnosis not present

## 2019-10-19 DIAGNOSIS — I4891 Unspecified atrial fibrillation: Secondary | ICD-10-CM | POA: Insufficient documentation

## 2019-10-19 DIAGNOSIS — F0390 Unspecified dementia without behavioral disturbance: Secondary | ICD-10-CM | POA: Insufficient documentation

## 2019-10-19 DIAGNOSIS — Y929 Unspecified place or not applicable: Secondary | ICD-10-CM | POA: Insufficient documentation

## 2019-10-19 DIAGNOSIS — M25512 Pain in left shoulder: Secondary | ICD-10-CM | POA: Diagnosis not present

## 2019-10-19 DIAGNOSIS — I4510 Unspecified right bundle-branch block: Secondary | ICD-10-CM | POA: Diagnosis not present

## 2019-10-19 DIAGNOSIS — I471 Supraventricular tachycardia: Secondary | ICD-10-CM | POA: Diagnosis not present

## 2019-10-19 LAB — PROTIME-INR
INR: 3.4 — ABNORMAL HIGH (ref 0.8–1.2)
Prothrombin Time: 33 seconds — ABNORMAL HIGH (ref 11.4–15.2)

## 2019-10-19 LAB — CBC WITH DIFFERENTIAL/PLATELET
Abs Immature Granulocytes: 0.21 10*3/uL — ABNORMAL HIGH (ref 0.00–0.07)
Basophils Absolute: 0.1 10*3/uL (ref 0.0–0.1)
Basophils Relative: 1 %
Eosinophils Absolute: 0 10*3/uL (ref 0.0–0.5)
Eosinophils Relative: 0 %
HCT: 31.7 % — ABNORMAL LOW (ref 39.0–52.0)
Hemoglobin: 9.6 g/dL — ABNORMAL LOW (ref 13.0–17.0)
Immature Granulocytes: 2 %
Lymphocytes Relative: 2 %
Lymphs Abs: 0.3 10*3/uL — ABNORMAL LOW (ref 0.7–4.0)
MCH: 28.2 pg (ref 26.0–34.0)
MCHC: 30.3 g/dL (ref 30.0–36.0)
MCV: 93 fL (ref 80.0–100.0)
Monocytes Absolute: 1.3 10*3/uL — ABNORMAL HIGH (ref 0.1–1.0)
Monocytes Relative: 9 %
Neutro Abs: 12.3 10*3/uL — ABNORMAL HIGH (ref 1.7–7.7)
Neutrophils Relative %: 86 %
Platelets: 250 10*3/uL (ref 150–400)
RBC: 3.41 MIL/uL — ABNORMAL LOW (ref 4.22–5.81)
RDW: 20.8 % — ABNORMAL HIGH (ref 11.5–15.5)
WBC: 14.3 10*3/uL — ABNORMAL HIGH (ref 4.0–10.5)
nRBC: 0 % (ref 0.0–0.2)

## 2019-10-19 LAB — COMPREHENSIVE METABOLIC PANEL
ALT: 21 U/L (ref 0–44)
AST: 36 U/L (ref 15–41)
Albumin: 3.8 g/dL (ref 3.5–5.0)
Alkaline Phosphatase: 93 U/L (ref 38–126)
Anion gap: 8 (ref 5–15)
BUN: 13 mg/dL (ref 8–23)
CO2: 28 mmol/L (ref 22–32)
Calcium: 8.7 mg/dL — ABNORMAL LOW (ref 8.9–10.3)
Chloride: 102 mmol/L (ref 98–111)
Creatinine, Ser: 0.82 mg/dL (ref 0.61–1.24)
GFR calc Af Amer: >60 mL/min (ref >60)
GFR calc non Af Amer: >60 mL/min (ref >60)
Glucose, Bld: 149 mg/dL — ABNORMAL HIGH (ref 70–99)
Potassium: 4.3 mmol/L (ref 3.5–5.1)
Sodium: 138 mmol/L (ref 135–145)
Total Bilirubin: 0.5 mg/dL (ref 0.3–1.2)
Total Protein: 7.3 g/dL (ref 6.5–8.1)

## 2019-10-19 LAB — URINALYSIS, ROUTINE W REFLEX MICROSCOPIC
Bilirubin Urine: NEGATIVE
Glucose, UA: NEGATIVE mg/dL
Hgb urine dipstick: NEGATIVE
Ketones, ur: NEGATIVE mg/dL
Leukocytes,Ua: NEGATIVE
Nitrite: NEGATIVE
Protein, ur: NEGATIVE mg/dL
Specific Gravity, Urine: 1.024 (ref 1.005–1.030)
pH: 6 (ref 5.0–8.0)

## 2019-10-19 LAB — SARS CORONAVIRUS 2 BY RT PCR (HOSPITAL ORDER, PERFORMED IN ~~LOC~~ HOSPITAL LAB): SARS Coronavirus 2: NEGATIVE

## 2019-10-19 LAB — TROPONIN I (HIGH SENSITIVITY): Troponin I (High Sensitivity): 4 ng/L (ref <18)

## 2019-10-19 LAB — APTT: aPTT: 40 seconds — ABNORMAL HIGH (ref 24–36)

## 2019-10-19 MED ORDER — NITROGLYCERIN 1 MG/10 ML FOR IR/CATH LAB
INTRA_ARTERIAL | Status: AC
  Filled 2019-10-19: qty 10

## 2019-10-19 NOTE — Discharge Instructions (Signed)
  All the results in the ER are normal, labs and imaging. We are not sure what is causing your symptoms. The workup in the ER is not complete, and is limited to screening for life threatening and emergent conditions only, so please see a primary care doctor for further evaluation.  Please return to the ER if your symptoms worsen; you have increased pain, fevers, chills, inability to keep any medications down, confusion. Otherwise see the outpatient doctor as requested.  

## 2019-10-19 NOTE — ED Provider Notes (Signed)
[facility] EMERGENCY DEPARTMENT Provider Note   CSN: [id number] Arrival date & time: 10/19/19  1803     History Chief Complaint  Patient presents with  . Loss of Consciousness    [name] is a 79 y.o. male.  HPI     79 year old male comes in a chief complaint of weakness. Patient arrives from his home after syncope.  Patient has history of stroke, A. fib, seizures, hypertension.  According to EMS, family called 911 after patient had syncopal episode.  Allegedly patient was diaphoretic and pale before he passed out.  Patient does not remember having any chest pain, shortness of breath.  He has no specific complaints from his side besides this feeling weak over the last couple of days.  Patient has been taking his medications as prescribed.  He denies any severe pain at this time.  Review of system is negative for any fevers, chills, nausea, vomiting, diarrhea, abdominal pain, UTI-like symptoms.   I was able to get in touch with patient's daughter towards the end of his visit.  She indicates that after amiodarone was started patient has not been the same and that he has had couple of ER visits after it.  She is concerned that the amiodarone could be contributing to his weakness and malaise.  Past Medical History:  Diagnosis Date  . Hiatal hernia   . HTN (hypertension)    pt denies h/o HTN though it appears he was started on spironolactone during his hospitalization 3/13  . Pancreatitis   . Seizures ([place])   . Stroke ([place])   . Typical atrial flutter ([place]) 3/13    Patient Active Problem List   Diagnosis Date Noted  . Hypomagnesemia 09/12/2019  . Acute blood loss anemia 09/12/2019  . Persistent atrial fibrillation ([place]) 08/22/2019  . Secondary hypercoagulable state ([place]) 08/22/2019  . Sepsis ([place]) 07/21/2019  . Total bilirubin, elevated   . Dilantin toxicity, accidental or unintentional, initial encounter 10/20/2018  . Dilantin overdose,  accidental or unintentional, initial encounter 10/20/2018  . GI bleed 07/22/2018  . Current chronic use of systemic steroids 01/11/2017  . RBBB 01/11/2017  . Polymyalgia ([place]) 01/11/2017  . Fever 01/07/2017  . GI bleeding 01/06/2017  . History of CVA in adulthood 01/06/2017  . Retroperitoneal hematoma 01/06/2017  . Pressure injury of skin 12/13/2016  . Fall   . Closed fracture of femur, intertrochanteric, left, initial encounter ([place]) 12/06/2016  . Severe protein-calorie malnutrition ([place]) 04/03/2016  . Hypotension 04/01/2016  . Leukopenia 03/31/2016  . Elevated INR 03/31/2016  . Hypokalemia 03/31/2016  . Acute pain of right knee 03/01/2016  . CAP (community acquired pneumonia) 12/01/2015  . Aspiration pneumonia ([place]) 12/01/2015  . Erosive gastropathy: Per EGD 11/30/2015 11/30/2015  . Normochromic anemia 11/28/2015  . Localization-related symptomatic epilepsy and epileptic syndromes with complex partial seizures, not intractable, without status epilepticus ([place]) 07/07/2015  . Closed Colles' fracture of right radius with routine healing 05/02/2015  . Pain 04/18/2015  . Dementia (AD) 11/16/2012  . Chronic anticoagulation 06/30/2011  . Paroxysmal atrial flutter ([place]) 06/04/2011  . 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 SINUSITIS, CHRONIC NEC 12/14/2006  . ABUSE, ALCOHOL, CONTINUOUS 10/11/2006  . History of cardiovascular disorder 10/11/2006  . Essential hypertension 09/15/2006  . Stroke ([place]) 09/15/2006  . ALLERGIC RHINITIS 09/15/2006  . CIRRHOSIS, ALCOHOLIC, LIVER 82/50/5397  . Seizure disorder ([place]) 09/15/2006  . PANCREATITIS, HX OF 09/15/2006    Past Surgical History:  Procedure Laterality Date  . ESOPHAGOGASTRODUODENOSCOPY N/A  11/30/2015   Procedure: ESOPHAGOGASTRODUODENOSCOPY (EGD);  Surgeon: Wonda Horner, MD;  Location: [facility] ENDOSCOPY;  Service: Endoscopy;  Laterality: N/A;  . ESOPHAGOGASTRODUODENOSCOPY N/A 09/11/2019   Procedure: ESOPHAGOGASTRODUODENOSCOPY (EGD);   Surgeon: Wonda Horner, MD;  Location: [facility] ENDOSCOPY;  Service: Endoscopy;  Laterality: N/A;  . ESOPHAGOGASTRODUODENOSCOPY (EGD) WITH PROPOFOL N/A 07/23/2018   Procedure: ESOPHAGOGASTRODUODENOSCOPY (EGD) WITH PROPOFOL;  Surgeon: Otis Brace, MD;  Location: [facility] ENDOSCOPY;  Service: Gastroenterology;  Laterality: N/A;  . FEMUR IM NAIL Left 12/08/2016  . FEMUR IM NAIL Left 12/07/2016   Procedure: INTRAMEDULLARY (IM) NAIL FEMORAL;  Surgeon: Renette Butters, MD;  Location: [facility] [facility];  Service: Orthopedics;  Laterality: Left;  . GIVENS CAPSULE STUDY N/A 07/24/2018   Procedure: GIVENS CAPSULE STUDY;  Surgeon: Otis Brace, MD;  Location: [facility];  Service: Gastroenterology;  Laterality: N/A;  . HIP SURGERY Left 01/2017  . HOT HEMOSTASIS N/A 07/23/2018   Procedure: HOT HEMOSTASIS (ARGON PLASMA COAGULATION/BICAP);  Surgeon: Otis Brace, MD;  Location: [facility] ENDOSCOPY;  Service: Gastroenterology;  Laterality: N/A;  . HOT HEMOSTASIS N/A 09/11/2019   Procedure: HOT HEMOSTASIS (ARGON PLASMA COAGULATION/BICAP);  Surgeon: Wonda Horner, MD;  Location: [facility] ENDOSCOPY;  Service: Endoscopy;  Laterality: N/A;  . no surgical history  06/2011       Family History  Problem Relation Age of Onset  . Hypertension Other     Social History   Tobacco Use  . Smoking status: Former Smoker    Quit date: 07/15/1966    Years since quitting: 53.2  . Smokeless tobacco: Never Used  Vaping Use  . Vaping Use: Never used  Substance Use Topics  . Alcohol use: No    Comment: Quit greater than 5 years ago  . Drug use: No    Home Medications Prior to Admission medications   Medication Sig Start Date End Date Taking? Authorizing Provider  amiodarone (PACERONE) 200 MG tablet Take 1 tablet (200 mg total) by mouth daily. Patient taking differently: Take 200 mg by mouth at bedtime.  08/22/19  Yes Fenton, Clint R, PA  feeding supplement, ENSURE ENLIVE, (ENSURE ENLIVE) LIQD Take 237 mLs by mouth 3 (three) times daily  between meals. Patient taking differently: Take 237 mLs by mouth 3 (three) times daily as needed (for supplementation).  07/26/19  Yes Hosie Poisson, MD  ferrous sulfate 325 (65 FE) MG tablet Take 1 tablet (325 mg total) by mouth 3 (three) times daily with meals. Patient taking differently: Take 325 mg by mouth daily with breakfast.  12/15/16  Yes Velvet Bathe, MD  folic acid (FOLVITE) 1 MG tablet Take 1 tablet (1 mg total) by mouth daily. 10/25/18  Yes Mercy Riding, MD  levETIRAcetam (KEPPRA) 500 MG tablet TAKE 3 TABLETS BY MOUTH TWICE A DAY Patient taking differently: Take 1,500 mg by mouth in the morning and at bedtime.  10/01/19  Yes Cameron Sprang, MD  Multiple Vitamin (MULTIVITAMIN WITH MINERALS) TABS tablet Take 1 tablet by mouth daily. 07/26/19  Yes Hosie Poisson, MD  pantoprazole (PROTONIX) 40 MG tablet Take 1 tablet (40 mg total) by mouth daily at 6 (six) AM. 09/13/19  Yes Eugenie Filler, MD  phenytoin (DILANTIN) 100 MG ER capsule Take 2 capsules (200 mg total) by mouth at bedtime. 10/01/19  Yes Cameron Sprang, MD  polyethylene glycol (MIRALAX / GLYCOLAX) 17 g packet Take 17 g by mouth daily as needed for mild constipation. Patient taking differently: Take 17 g by mouth daily as needed for mild  constipation (MIX INTO WATER AS DIRECTED AND DRINK).  10/24/18  Yes Mercy Riding, MD  warfarin (COUMADIN) 2.5 MG tablet TAKE AS DIRECTED BY COUMADIN CLINIC Patient taking differently: Take 2.5-5 mg by mouth See admin instructions. Take 5 mg by mouth at 6 PM in the evening on Sun/Tues/Wed/Thurs/Sat and 2.5 mg on Mon/Fri 10/15/19  Yes Allred, Jeneen Rinks, MD    Allergies    Patient has no known allergies.  Review of Systems   Review of Systems  Constitutional: Positive for activity change. Negative for fever.  Respiratory: Negative for shortness of breath.   Cardiovascular: Negative for chest pain.  Gastrointestinal: Negative for nausea and vomiting.  Hematological: Does not bruise/bleed easily.  All  other systems reviewed and are negative.   Physical Exam Updated Vital Signs BP 116/69 (BP Location: Right Arm)   Pulse 100   Temp 99.8 F (37.7 C) (Rectal)   Resp 14   Ht 6\' 1"  (1.854 m)   Wt 55.1 kg   SpO2 95%   BMI 16.03 kg/m   Physical Exam Vitals and nursing note reviewed.  Constitutional:      Appearance: He is well-developed.     Comments: Cachectic  HENT:     Head: Atraumatic.  Cardiovascular:     Rate and Rhythm: Tachycardia present.  Pulmonary:     Effort: Pulmonary effort is normal.  Abdominal:     Tenderness: There is no abdominal tenderness.  Musculoskeletal:        General: No deformity.     Cervical back: Neck supple.     Right lower leg: No edema.     Left lower leg: No edema.  Skin:    General: Skin is warm.  Neurological:     Mental Status: He is alert and oriented to person, place, and time.     ED Results / Procedures / Treatments   Labs (all labs ordered are listed, but only abnormal results are displayed) Labs Reviewed  COMPREHENSIVE METABOLIC PANEL - Abnormal; Notable for the following components:      Result Value   Glucose, Bld 149 (*)    Calcium 8.7 (*)    All other components within normal limits  CBC WITH DIFFERENTIAL/PLATELET - Abnormal; Notable for the following components:   WBC 14.3 (*)    RBC 3.41 (*)    Hemoglobin 9.6 (*)    HCT 31.7 (*)    RDW 20.8 (*)    Neutro Abs 12.3 (*)    Lymphs Abs 0.3 (*)    Monocytes Absolute 1.3 (*)    Abs Immature Granulocytes 0.21 (*)    All other components within normal limits  PROTIME-INR - Abnormal; Notable for the following components:   Prothrombin Time 33.0 (*)    INR 3.4 (*)    All other components within normal limits  APTT - Abnormal; Notable for the following components:   aPTT 40 (*)    All other components within normal limits  SARS CORONAVIRUS 2 BY RT PCR (HOSPITAL ORDER, [facility])  URINE CULTURE  URINALYSIS, ROUTINE W REFLEX MICROSCOPIC    TROPONIN I (HIGH SENSITIVITY)    EKG EKG Interpretation  Date/Time:  Friday October 19 2019 18:09:45 EDT Ventricular Rate:  101 PR Interval:    QRS Duration: 107 QT Interval:  350 QTC Calculation: 202 R Axis:   -34 Text Interpretation: Sinus or ectopic atrial tachycardia Incomplete RBBB and LAFB Probable right ventricular hypertrophy ST elevation suggests acute pericarditis No acute  changes No significant change since last tracing Confirmed by Varney Biles [phone]) on 10/19/2019 10:05:15 PM   Radiology CT Head Wo Contrast  Result Date: 10/19/2019 CLINICAL DATA:  Syncope EXAM: CT HEAD WITHOUT CONTRAST TECHNIQUE: Contiguous axial images were obtained from the base of the skull through the vertex without intravenous contrast. COMPARISON:  CT brain 12/06/2016 FINDINGS: Brain: No acute territorial infarction, hemorrhage or new intracranial mass. Encephalomalacia in the left temporal lobe as before. Moderate atrophy. Mild hypodensity in the white matter consistent with chronic small vessel ischemic change. Stable 1.2 cm partially calcified extra-axial mass along the right cranial vertex likely a meningioma. Stable ventricle size. Vascular: No hyperdense vessels.  Carotid vascular calcification. Skull: Normal. Negative for fracture or focal lesion. Sinuses/Orbits: Mild mucosal thickening in the ethmoid and maxillary sinuses. Old appearing deformity medial wall right orbit Other: None IMPRESSION: 1. No CT evidence for acute intracranial abnormality. 2. Atrophy and mild chronic small vessel ischemic changes of the white matter. Remote left temporal lobe infarct. 3. Stable 1.2 cm partially calcified extra-axial mass along the right cranial vertex, likely meningioma. Electronically Signed   By: Donavan Foil [name]   On: 10/19/2019 23:07   DG Chest Port 1 View  Result Date: 10/19/2019 CLINICAL DATA:  Questionable sepsis.  Evaluate for abnormality. EXAM: PORTABLE CHEST 1 VIEW COMPARISON:  Prior chest  radiographs 09/09/2019 and earlier. FINDINGS: Please note the right lateral costophrenic angle is not entirely included in the field of view. Heart size within normal limits. No appreciable airspace consolidation. No evidence of pleural effusion or pneumothorax. No acute bony abnormality identified. IMPRESSION: Please note a small portion of the right lateral costophrenic angle is excluded from the field of view. No evidence of acute cardiopulmonary abnormality. Electronically Signed   By: Kellie Simmering DO   On: 10/19/2019 20:07    Procedures Procedures (including critical care time)  Medications Ordered in ED Medications - No data to display  ED Course  I have reviewed the triage vital signs and the nursing notes.  Pertinent labs & imaging results that were available during my care of the patient were reviewed by me and considered in my medical decision making (see chart for details).  Clinical Course as of Oct 19 2338  Fri Oct 19, 2019  2232 All lab results are reassuring. Patient reassessed. Pt is comfortable at this time.  Results of the workup discussed. Strict ER return precautions discussed. Follow up instruction discussed, and pt agrees with the plan and is comfortable with it.    [AN]    Clinical Course User Index [AN] Varney Biles, MD   MDM Rules/Calculators/A&P                          79 year old male comes in a chief complaint of syncope.  He has history of a flutter.  He also has history of GI bleed and is on anticoagulation.  Work-up in the ER did not reveal severe anemia.  Patient is not noted to be in A. fib with RVR either.  We continued cardiac monitoring to see if there is any arrhythmia/dysrhythmia -but we did not pick up any tachycardia dysrhythmia.  Unlikely to be cardiogenic syncope, most likely this was a neurocardiogenic or vasovagal event.  CT scan of the brain was ordered because of Coumadin and it is negative. Infection work-up was not revealing any  evidence of UTI, pneumonia.  At the time of discharge patient started complaining of left-sided  scapular pain.  X-rays of the scapula ordered.  Patient's daughter is concerned about amiodarone contributing to his overall weakness.  I have sent a message to Dr. Rayann Heman, cardiology to see if they can see the patient in the clinic and discuss amiodarone side effects and the utility of continuing it versus discontinuing it.  It is entirely possible that amiodarone, she was started just few weeks ago could be causing some side effects.  Patient is stable for discharge.  Final Clinical Impression(s) / ED Diagnoses Final diagnoses:  Generalized weakness  Syncope and collapse  Contusion of left scapular region, initial encounter    Rx / DC Orders ED Discharge Orders    None       Varney Biles, MD 10/19/19 2345

## 2019-10-19 NOTE — ED Notes (Signed)
Patient transported to X-ray 

## 2019-10-19 NOTE — ED Triage Notes (Signed)
Pt BIB [facility] from home. Pt sister initially called EMS for syncopal episode. Upon EMS arrival, pt pale and diaphoretic. Per EMS 12-lead EKG showed elevation. Called code STEMI in field. Code STEMI cancelled upon arrival to department. VSS. NAD.

## 2019-10-19 NOTE — ED Notes (Signed)
Patient transported to CT 

## 2019-10-20 DIAGNOSIS — M25512 Pain in left shoulder: Secondary | ICD-10-CM | POA: Diagnosis not present

## 2019-10-20 DIAGNOSIS — S4992XA Unspecified injury of left shoulder and upper arm, initial encounter: Secondary | ICD-10-CM | POA: Diagnosis not present

## 2019-10-21 ENCOUNTER — Emergency Department (HOSPITAL_COMMUNITY): Payer: Medicare Other

## 2019-10-21 ENCOUNTER — Inpatient Hospital Stay (HOSPITAL_COMMUNITY)
Admission: EM | Admit: 2019-10-21 | Discharge: 2019-10-31 | DRG: 871 | Disposition: A | Discharge status: Skilled Nursing Facility | Payer: Medicare Other | Source: Skilled Nursing Facility | Attending: Student | Admitting: Student

## 2019-10-21 ENCOUNTER — Encounter (HOSPITAL_COMMUNITY): Payer: Self-pay

## 2019-10-21 ENCOUNTER — Observation Stay (HOSPITAL_COMMUNITY): Payer: Medicare Other

## 2019-10-21 DIAGNOSIS — Z79899 Other long term (current) drug therapy: Secondary | ICD-10-CM

## 2019-10-21 DIAGNOSIS — R1310 Dysphagia, unspecified: Secondary | ICD-10-CM | POA: Diagnosis not present

## 2019-10-21 DIAGNOSIS — E876 Hypokalemia: Secondary | ICD-10-CM | POA: Diagnosis not present

## 2019-10-21 DIAGNOSIS — I4510 Unspecified right bundle-branch block: Secondary | ICD-10-CM | POA: Diagnosis present

## 2019-10-21 DIAGNOSIS — G40909 Epilepsy, unspecified, not intractable, without status epilepticus: Secondary | ICD-10-CM | POA: Diagnosis not present

## 2019-10-21 DIAGNOSIS — I4819 Other persistent atrial fibrillation: Secondary | ICD-10-CM | POA: Diagnosis present

## 2019-10-21 DIAGNOSIS — I10 Essential (primary) hypertension: Secondary | ICD-10-CM | POA: Diagnosis present

## 2019-10-21 DIAGNOSIS — Z8673 Personal history of transient ischemic attack (TIA), and cerebral infarction without residual deficits: Secondary | ICD-10-CM

## 2019-10-21 DIAGNOSIS — Z87891 Personal history of nicotine dependence: Secondary | ICD-10-CM

## 2019-10-21 DIAGNOSIS — I248 Other forms of acute ischemic heart disease: Secondary | ICD-10-CM | POA: Diagnosis not present

## 2019-10-21 DIAGNOSIS — R140 Abdominal distension (gaseous): Secondary | ICD-10-CM | POA: Diagnosis not present

## 2019-10-21 DIAGNOSIS — J189 Pneumonia, unspecified organism: Secondary | ICD-10-CM

## 2019-10-21 DIAGNOSIS — F0280 Dementia in other diseases classified elsewhere without behavioral disturbance: Secondary | ICD-10-CM

## 2019-10-21 DIAGNOSIS — E872 Acidosis: Secondary | ICD-10-CM | POA: Diagnosis present

## 2019-10-21 DIAGNOSIS — Z0389 Encounter for observation for other suspected diseases and conditions ruled out: Secondary | ICD-10-CM | POA: Diagnosis not present

## 2019-10-21 DIAGNOSIS — D500 Iron deficiency anemia secondary to blood loss (chronic): Secondary | ICD-10-CM | POA: Diagnosis present

## 2019-10-21 DIAGNOSIS — K56609 Unspecified intestinal obstruction, unspecified as to partial versus complete obstruction: Secondary | ICD-10-CM

## 2019-10-21 DIAGNOSIS — E1165 Type 2 diabetes mellitus with hyperglycemia: Secondary | ICD-10-CM | POA: Diagnosis present

## 2019-10-21 DIAGNOSIS — R778 Other specified abnormalities of plasma proteins: Secondary | ICD-10-CM

## 2019-10-21 DIAGNOSIS — Z7901 Long term (current) use of anticoagulants: Secondary | ICD-10-CM | POA: Diagnosis not present

## 2019-10-21 DIAGNOSIS — Z20822 Contact with and (suspected) exposure to covid-19: Secondary | ICD-10-CM | POA: Diagnosis present

## 2019-10-21 DIAGNOSIS — I4891 Unspecified atrial fibrillation: Secondary | ICD-10-CM | POA: Diagnosis not present

## 2019-10-21 DIAGNOSIS — I4892 Unspecified atrial flutter: Secondary | ICD-10-CM

## 2019-10-21 DIAGNOSIS — G309 Alzheimer's disease, unspecified: Secondary | ICD-10-CM

## 2019-10-21 DIAGNOSIS — A4150 Gram-negative sepsis, unspecified: Principal | ICD-10-CM | POA: Diagnosis present

## 2019-10-21 DIAGNOSIS — R112 Nausea with vomiting, unspecified: Secondary | ICD-10-CM

## 2019-10-21 DIAGNOSIS — J690 Pneumonitis due to inhalation of food and vomit: Secondary | ICD-10-CM | POA: Diagnosis present

## 2019-10-21 DIAGNOSIS — E43 Unspecified severe protein-calorie malnutrition: Secondary | ICD-10-CM | POA: Diagnosis present

## 2019-10-21 DIAGNOSIS — G40209 Localization-related (focal) (partial) symptomatic epilepsy and epileptic syndromes with complex partial seizures, not intractable, without status epilepticus: Secondary | ICD-10-CM

## 2019-10-21 DIAGNOSIS — R6520 Severe sepsis without septic shock: Secondary | ICD-10-CM | POA: Diagnosis present

## 2019-10-21 DIAGNOSIS — R918 Other nonspecific abnormal finding of lung field: Secondary | ICD-10-CM | POA: Diagnosis not present

## 2019-10-21 DIAGNOSIS — J90 Pleural effusion, not elsewhere classified: Secondary | ICD-10-CM

## 2019-10-21 DIAGNOSIS — A419 Sepsis, unspecified organism: Secondary | ICD-10-CM

## 2019-10-21 DIAGNOSIS — K219 Gastro-esophageal reflux disease without esophagitis: Secondary | ICD-10-CM | POA: Diagnosis present

## 2019-10-21 DIAGNOSIS — I480 Paroxysmal atrial fibrillation: Secondary | ICD-10-CM | POA: Diagnosis present

## 2019-10-21 DIAGNOSIS — K921 Melena: Secondary | ICD-10-CM | POA: Diagnosis not present

## 2019-10-21 DIAGNOSIS — K567 Ileus, unspecified: Secondary | ICD-10-CM | POA: Diagnosis not present

## 2019-10-21 DIAGNOSIS — I700 Atherosclerosis of aorta: Secondary | ICD-10-CM | POA: Diagnosis not present

## 2019-10-21 DIAGNOSIS — M25519 Pain in unspecified shoulder: Secondary | ICD-10-CM

## 2019-10-21 DIAGNOSIS — Y95 Nosocomial condition: Secondary | ICD-10-CM | POA: Diagnosis present

## 2019-10-21 DIAGNOSIS — J156 Pneumonia due to other aerobic Gram-negative bacteria: Secondary | ICD-10-CM | POA: Diagnosis present

## 2019-10-21 DIAGNOSIS — I313 Pericardial effusion (noninflammatory): Secondary | ICD-10-CM | POA: Diagnosis present

## 2019-10-21 DIAGNOSIS — J9601 Acute respiratory failure with hypoxia: Secondary | ICD-10-CM | POA: Diagnosis present

## 2019-10-21 DIAGNOSIS — F0390 Unspecified dementia without behavioral disturbance: Secondary | ICD-10-CM | POA: Diagnosis present

## 2019-10-21 DIAGNOSIS — S40012A Contusion of left shoulder, initial encounter: Secondary | ICD-10-CM | POA: Diagnosis present

## 2019-10-21 DIAGNOSIS — Z681 Body mass index (BMI) 19 or less, adult: Secondary | ICD-10-CM

## 2019-10-21 DIAGNOSIS — K7030 Alcoholic cirrhosis of liver without ascites: Secondary | ICD-10-CM | POA: Diagnosis present

## 2019-10-21 DIAGNOSIS — D649 Anemia, unspecified: Secondary | ICD-10-CM | POA: Diagnosis not present

## 2019-10-21 DIAGNOSIS — R64 Cachexia: Secondary | ICD-10-CM | POA: Diagnosis present

## 2019-10-21 HISTORY — DX: Type 2 diabetes mellitus without complications: E11.9

## 2019-10-21 LAB — URINALYSIS, ROUTINE W REFLEX MICROSCOPIC
Bacteria, UA: NONE SEEN
Bilirubin Urine: NEGATIVE
Glucose, UA: >=500 mg/dL — AB
Ketones, ur: NEGATIVE mg/dL
Leukocytes,Ua: NEGATIVE
Nitrite: NEGATIVE
Protein, ur: 100 mg/dL — AB
Specific Gravity, Urine: 1.028 (ref 1.005–1.030)
pH: 5 (ref 5.0–8.0)

## 2019-10-21 LAB — BASIC METABOLIC PANEL
Anion gap: 12 (ref 5–15)
BUN: 14 mg/dL (ref 8–23)
CO2: 24 mmol/L (ref 22–32)
Calcium: 8.9 mg/dL (ref 8.9–10.3)
Chloride: 99 mmol/L (ref 98–111)
Creatinine, Ser: 0.86 mg/dL (ref 0.61–1.24)
GFR calc Af Amer: >60 mL/min (ref >60)
GFR calc non Af Amer: >60 mL/min (ref >60)
Glucose, Bld: 239 mg/dL — ABNORMAL HIGH (ref 70–99)
Potassium: 3.4 mmol/L — ABNORMAL LOW (ref 3.5–5.1)
Sodium: 135 mmol/L (ref 135–145)

## 2019-10-21 LAB — CBC
HCT: 30.5 % — ABNORMAL LOW (ref 39.0–52.0)
Hemoglobin: 9.3 g/dL — ABNORMAL LOW (ref 13.0–17.0)
MCH: 27.9 pg (ref 26.0–34.0)
MCHC: 30.5 g/dL (ref 30.0–36.0)
MCV: 91.6 fL (ref 80.0–100.0)
Platelets: 316 10*3/uL (ref 150–400)
RBC: 3.33 MIL/uL — ABNORMAL LOW (ref 4.22–5.81)
RDW: 20.3 % — ABNORMAL HIGH (ref 11.5–15.5)
WBC: 18.1 10*3/uL — ABNORMAL HIGH (ref 4.0–10.5)
nRBC: 0.2 % (ref 0.0–0.2)

## 2019-10-21 LAB — URINE CULTURE: Culture: NO GROWTH

## 2019-10-21 LAB — HEPATIC FUNCTION PANEL
ALT: 23 U/L (ref 0–44)
AST: 46 U/L — ABNORMAL HIGH (ref 15–41)
Albumin: 3.6 g/dL (ref 3.5–5.0)
Alkaline Phosphatase: 77 U/L (ref 38–126)
Bilirubin, Direct: 0.2 mg/dL (ref 0.0–0.2)
Indirect Bilirubin: 0.5 mg/dL (ref 0.3–0.9)
Total Bilirubin: 0.7 mg/dL (ref 0.3–1.2)
Total Protein: 7.4 g/dL (ref 6.5–8.1)

## 2019-10-21 LAB — LACTIC ACID, PLASMA
Lactic Acid, Venous: 3.2 mmol/L (ref 0.5–1.9)
Lactic Acid, Venous: 3.1 mmol/L (ref 0.5–1.9)

## 2019-10-21 LAB — STREP PNEUMONIAE URINARY ANTIGEN: Strep Pneumo Urinary Antigen: NEGATIVE

## 2019-10-21 LAB — TROPONIN I (HIGH SENSITIVITY)
Troponin I (High Sensitivity): 18 ng/L — ABNORMAL HIGH (ref <18)
Troponin I (High Sensitivity): 22 ng/L — ABNORMAL HIGH (ref <18)

## 2019-10-21 LAB — BRAIN NATRIURETIC PEPTIDE: B Natriuretic Peptide: 391.1 pg/mL — ABNORMAL HIGH (ref 0.0–100.0)

## 2019-10-21 LAB — PROTIME-INR
INR: 2.4 — ABNORMAL HIGH (ref 0.8–1.2)
Prothrombin Time: 25.3 seconds — ABNORMAL HIGH (ref 11.4–15.2)

## 2019-10-21 LAB — CBG MONITORING, ED: Glucose-Capillary: 152 mg/dL — ABNORMAL HIGH (ref 70–99)

## 2019-10-21 MED ORDER — ONDANSETRON HCL 4 MG PO TABS: 4.0000 mg | ORAL_TABLET | Freq: Four times a day (QID) | ORAL | Status: DC | PRN

## 2019-10-21 MED ORDER — WARFARIN SODIUM 2.5 MG PO TABS
2.5000 mg | ORAL_TABLET | Freq: Once | ORAL | Status: AC
  Administered 2019-10-21: 2.5 mg via ORAL
  Filled 2019-10-21: qty 1

## 2019-10-21 MED ORDER — LEVETIRACETAM 500 MG PO TABS
1500.0000 mg | ORAL_TABLET | Freq: Two times a day (BID) | ORAL | Status: DC
  Administered 2019-10-21 – 2019-10-22 (×3): 1500 mg via ORAL
  Filled 2019-10-21 (×3): qty 3

## 2019-10-21 MED ORDER — PHENYTOIN SODIUM EXTENDED 100 MG PO CAPS
200.0000 mg | ORAL_CAPSULE | Freq: Every day | ORAL | Status: DC
  Administered 2019-10-21 – 2019-10-22 (×2): 200 mg via ORAL
  Filled 2019-10-21 (×3): qty 2

## 2019-10-21 MED ORDER — ACETAMINOPHEN 325 MG PO TABS
650.0000 mg | ORAL_TABLET | Freq: Once | ORAL | Status: AC
  Administered 2019-10-21: 650 mg via ORAL
  Filled 2019-10-21: qty 2

## 2019-10-21 MED ORDER — SODIUM CHLORIDE 0.9 % IV SOLN: INTRAVENOUS | Status: AC

## 2019-10-21 MED ORDER — SODIUM CHLORIDE 0.9 % IV SOLN
100.0000 mg | Freq: Two times a day (BID) | INTRAVENOUS | Status: DC
  Administered 2019-10-21 – 2019-10-24 (×6): 100 mg via INTRAVENOUS
  Filled 2019-10-21 (×7): qty 100

## 2019-10-21 MED ORDER — PIPERACILLIN-TAZOBACTAM 3.375 G IVPB 30 MIN
3.3750 g | Freq: Once | INTRAVENOUS | Status: AC
  Administered 2019-10-21: 3.375 g via INTRAVENOUS
  Filled 2019-10-21: qty 50

## 2019-10-21 MED ORDER — FOLIC ACID 1 MG PO TABS
1.0000 mg | ORAL_TABLET | Freq: Every day | ORAL | Status: DC
  Administered 2019-10-21: 1 mg via ORAL
  Filled 2019-10-21 (×2): qty 1

## 2019-10-21 MED ORDER — PANTOPRAZOLE SODIUM 40 MG PO TBEC
40.0000 mg | DELAYED_RELEASE_TABLET | Freq: Every day | ORAL | Status: DC
  Administered 2019-10-23: 40 mg via ORAL
  Filled 2019-10-21 (×2): qty 1

## 2019-10-21 MED ORDER — IOHEXOL 300 MG/ML  SOLN
100.0000 mL | Freq: Once | INTRAMUSCULAR | Status: AC | PRN
  Administered 2019-10-21: 100 mL via INTRAVENOUS

## 2019-10-21 MED ORDER — HYDROCODONE-ACETAMINOPHEN 5-325 MG PO TABS
1.0000 | ORAL_TABLET | ORAL | Status: DC | PRN
  Administered 2019-10-23: 1 via ORAL
  Filled 2019-10-21: qty 1

## 2019-10-21 MED ORDER — GUAIFENESIN ER 600 MG PO TB12
600.0000 mg | ORAL_TABLET | Freq: Two times a day (BID) | ORAL | Status: DC
  Administered 2019-10-21 – 2019-10-22 (×2): 600 mg via ORAL
  Filled 2019-10-21 (×3): qty 1

## 2019-10-21 MED ORDER — ACETAMINOPHEN 650 MG RE SUPP: 650.0000 mg | Freq: Four times a day (QID) | RECTAL | Status: DC | PRN

## 2019-10-21 MED ORDER — SODIUM CHLORIDE 0.9 % IV SOLN: 2.0000 g | INTRAVENOUS | Status: DC

## 2019-10-21 MED ORDER — LACTATED RINGERS IV BOLUS
1000.0000 mL | Freq: Once | INTRAVENOUS | Status: AC
  Administered 2019-10-21: 1000 mL via INTRAVENOUS

## 2019-10-21 MED ORDER — ONDANSETRON HCL 4 MG/2ML IJ SOLN
4.0000 mg | Freq: Four times a day (QID) | INTRAMUSCULAR | Status: DC | PRN
  Administered 2019-10-22 – 2019-10-24 (×3): 4 mg via INTRAVENOUS
  Filled 2019-10-21 (×3): qty 2

## 2019-10-21 MED ORDER — DOCUSATE SODIUM 100 MG PO CAPS
100.0000 mg | ORAL_CAPSULE | Freq: Two times a day (BID) | ORAL | Status: DC
  Administered 2019-10-21 – 2019-10-31 (×14): 100 mg via ORAL
  Filled 2019-10-21 (×15): qty 1

## 2019-10-21 MED ORDER — VANCOMYCIN HCL 1250 MG/250ML IV SOLN
1250.0000 mg | Freq: Once | INTRAVENOUS | Status: AC
  Administered 2019-10-21: 1250 mg via INTRAVENOUS
  Filled 2019-10-21: qty 250

## 2019-10-21 MED ORDER — ACETAMINOPHEN 325 MG PO TABS
650.0000 mg | ORAL_TABLET | Freq: Four times a day (QID) | ORAL | Status: DC | PRN
  Administered 2019-10-22 – 2019-10-31 (×7): 650 mg via ORAL
  Filled 2019-10-21 (×7): qty 2

## 2019-10-21 MED ORDER — AMIODARONE HCL 200 MG PO TABS
200.0000 mg | ORAL_TABLET | Freq: Every day | ORAL | Status: DC
  Administered 2019-10-21: 200 mg via ORAL
  Filled 2019-10-21: qty 1

## 2019-10-21 MED ORDER — ALBUTEROL SULFATE (2.5 MG/3ML) 0.083% IN NEBU: 2.5000 mg | INHALATION_SOLUTION | RESPIRATORY_TRACT | Status: DC | PRN

## 2019-10-21 MED ORDER — WARFARIN - PHARMACIST DOSING INPATIENT: Freq: Every day | Status: DC

## 2019-10-21 MED ORDER — AMIODARONE IV BOLUS ONLY 150 MG/100ML
150.0000 mg | Freq: Once | INTRAVENOUS | Status: AC
  Administered 2019-10-21: 150 mg via INTRAVENOUS
  Filled 2019-10-21: qty 100

## 2019-10-21 MED ORDER — INSULIN ASPART 100 UNIT/ML ~~LOC~~ SOLN
0.0000 [IU] | SUBCUTANEOUS | Status: DC
  Administered 2019-10-21 – 2019-10-22 (×2): 1 [IU] via SUBCUTANEOUS
  Administered 2019-10-22: 2 [IU] via SUBCUTANEOUS
  Administered 2019-10-22 – 2019-10-24 (×3): 1 [IU] via SUBCUTANEOUS
  Administered 2019-10-24: 2 [IU] via SUBCUTANEOUS
  Administered 2019-10-24 – 2019-10-25 (×4): 1 [IU] via SUBCUTANEOUS
  Administered 2019-10-26: 2 [IU] via SUBCUTANEOUS
  Administered 2019-10-26: 1 [IU] via SUBCUTANEOUS
  Administered 2019-10-27: 2 [IU] via SUBCUTANEOUS
  Administered 2019-10-27: 1 [IU] via SUBCUTANEOUS
  Administered 2019-10-27 – 2019-10-28 (×3): 2 [IU] via SUBCUTANEOUS
  Administered 2019-10-29: 1 [IU] via SUBCUTANEOUS

## 2019-10-21 MED ORDER — SODIUM CHLORIDE 0.9% FLUSH
3.0000 mL | Freq: Two times a day (BID) | INTRAVENOUS | Status: DC
  Administered 2019-10-21 – 2019-10-31 (×18): 3 mL via INTRAVENOUS

## 2019-10-21 NOTE — ED Notes (Signed)
Pt's daughter has been updated, she will be here shortly.

## 2019-10-21 NOTE — ED Provider Notes (Signed)
[place] EMERGENCY DEPARTMENT Provider Note   CSN: [id number] Arrival date & time: 10/21/19  1553     History Chief Complaint  Patient presents with  . Weakness    [name] is a 79 y.o. male.   Weakness Severity:  Moderate Onset quality:  Gradual Timing:  Constant Progression:  Worsening Chronicity:  New Context comment:  Cough Relieved by:  Nothing Worsened by:  Nothing Ineffective treatments:  None tried Associated symptoms: cough and dizziness   Associated symptoms: no abdominal pain, no arthralgias, no chest pain, no diarrhea, no dysuria, no numbness in extremities, no fever, no headaches, no nausea, no near-syncope, no shortness of breath, no stroke symptoms, no syncope and no vomiting        Past Medical History:  Diagnosis Date  . Hiatal hernia   . HTN (hypertension)    pt denies h/o HTN though it appears he was started on spironolactone during his hospitalization 3/13  . Pancreatitis   . Seizures ([place])   . Stroke ([place])   . Typical atrial flutter ([place]) 3/13    Patient Active Problem List   Diagnosis Date Noted  . Hypomagnesemia 09/12/2019  . Acute blood loss anemia 09/12/2019  . Persistent atrial fibrillation ([place]) 08/22/2019  . Secondary hypercoagulable state ([place]) 08/22/2019  . Sepsis ([place]) 07/21/2019  . Total bilirubin, elevated   . Dilantin toxicity, accidental or unintentional, initial encounter 10/20/2018  . Dilantin overdose, accidental or unintentional, initial encounter 10/20/2018  . GI bleed 07/22/2018  . Current chronic use of systemic steroids 01/11/2017  . RBBB 01/11/2017  . Polymyalgia ([place]) 01/11/2017  . Fever 01/07/2017  . GI bleeding 01/06/2017  . History of CVA in adulthood 01/06/2017  . Retroperitoneal hematoma 01/06/2017  . Pressure injury of skin 12/13/2016  . Fall   . Closed fracture of femur, intertrochanteric, left, initial encounter ([place]) 12/06/2016  . Severe protein-calorie malnutrition  ([place]) 04/03/2016  . Hypotension 04/01/2016  . Leukopenia 03/31/2016  . Elevated INR 03/31/2016  . Hypokalemia 03/31/2016  . Acute pain of right knee 03/01/2016  . CAP (community acquired pneumonia) 12/01/2015  . Aspiration pneumonia ([place]) 12/01/2015  . Erosive gastropathy: Per EGD 11/30/2015 11/30/2015  . Normochromic anemia 11/28/2015  . 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 Closed Colles' fracture of right radius with routine healing 05/02/2015  . Pain 04/18/2015  . Dementia (AD) 11/16/2012  . Chronic anticoagulation 06/30/2011  . Paroxysmal atrial flutter ([place]) 06/04/2011  . 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 SINUSITIS, CHRONIC NEC 12/14/2006  . ABUSE, ALCOHOL, CONTINUOUS 10/11/2006  . History of cardiovascular disorder 10/11/2006  . Essential hypertension 09/15/2006  . Stroke ([place]) 09/15/2006  . ALLERGIC RHINITIS 09/15/2006  . CIRRHOSIS, ALCOHOLIC, LIVER 84/69/6295  . Seizure disorder ([place]) 09/15/2006  . PANCREATITIS, HX OF 09/15/2006    Past Surgical History:  Procedure Laterality Date  . ESOPHAGOGASTRODUODENOSCOPY N/A 11/30/2015   Procedure: ESOPHAGOGASTRODUODENOSCOPY (EGD);  Surgeon: Wonda Horner, MD;  Location[facility]COPY;  Service: Endoscopy;  Laterality: N/A;  . ESOPHAGOGASTRODUODENOSCOPY N/A 09/11/2019   Procedure: ESOPHAGOGASTRODUODENOSCOPY (EGD);  Surgeon: Wonda Horner, MD;  Location: [facility] ENDOSCOPY;  Service: Endoscopy;  Laterality: N/A;  . ESOPHAGOGASTRODUODENOSCOPY (EGD) WITH PROPOFOL N/A 07/23/2018   Procedure: ESOPHAGOGASTRODUODENOSCOPY (EGD) WITH PROPOFOL;  Surgeon: Otis Brace, MD;  Location: [facility] ENDOSCOPY;  Service: Gastroenterology;  Laterality: N/A;  . FEMUR IM NAIL Left 12/08/2016  . FEMUR IM NAIL Left 12/07/2016  Procedure: INTRAMEDULLARY (IM) NAIL FEMORAL;  Surgeon: Renette Butters, MD;  Location: [place];  Service:  Orthopedics;  Laterality: Left;  . GIVENS CAPSULE STUDY N/A 07/24/2018   Procedure: GIVENS CAPSULE STUDY;  Surgeon: Otis Brace, MD;  Location: [place];  Service: Gastroenterology;  Laterality: N/A;  . HIP SURGERY Left 01/2017  . HOT HEMOSTASIS N/A 07/23/2018   Procedure: HOT HEMOSTASIS (ARGON PLASMA COAGULATION/BICAP);  Surgeon: Otis Brace, MD;  Location[facility] ENDOSCOPY;  Service: Gastroenterology;  Laterality: N/A;  . HOT HEMOSTASIS N/A 09/11/2019   Procedure: HOT HEMOSTASIS (ARGON PLASMA COAGULATION/BICAP);  Surgeon: Wonda Horner, MD;  Location: [facility] ENDOSCOPY;  Service: Endoscopy;  Laterality: N/A;  . no surgical history  06/2011       Family History  Problem Relation Age of Onset  . Hypertension Other     Social History   Tobacco Use  . Smoking status: Former Smoker    Quit date: 07/15/1966    Years since quitting: 53.3  . Smokeless tobacco: Never Used  Vaping Use  . Vaping Use: Never used  Substance Use Topics  . Alcohol use: No    Comment: Quit greater than 5 years ago  . Drug use: No    Home Medications Prior to Admission medications   Medication Sig Start Date End Date Taking? Authorizing Provider  amiodarone (PACERONE) 200 MG tablet Take 1 tablet (200 mg total) by mouth daily. Patient taking differently: Take 200 mg by mouth at bedtime.  08/22/19  Yes Fenton, Clint R, PA  feeding supplement, ENSURE ENLIVE, (ENSURE ENLIVE) LIQD Take 237 mLs by mouth 3 (three) times daily between meals. Patient taking differently: Take 237 mLs by mouth 3 (three) times daily as needed (for supplementation).  07/26/19  Yes Hosie Poisson, MD  ferrous sulfate 325 (65 FE) MG tablet Take 1 tablet (325 mg total) by mouth 3 (three) times daily with meals. Patient taking differently: Take 325 mg by mouth daily with breakfast.  12/15/16  Yes Velvet Bathe, MD  folic acid (FOLVITE) 1 MG tablet Take 1 tablet (1 mg total) by mouth daily. 10/25/18  Yes Mercy Riding, MD  levETIRAcetam (KEPPRA)  500 MG tablet TAKE 3 TABLETS BY MOUTH TWICE A DAY Patient taking differently: Take 1,500 mg by mouth in the morning and at bedtime.  10/01/19  Yes Cameron Sprang, MD  Multiple Vitamin (MULTIVITAMIN WITH MINERALS) TABS tablet Take 1 tablet by mouth daily. 07/26/19  Yes Hosie Poisson, MD  pantoprazole (PROTONIX) 40 MG tablet Take 1 tablet (40 mg total) by mouth daily at 6 (six) AM. 09/13/19  Yes Eugenie Filler, MD  phenytoin (DILANTIN) 100 MG ER capsule Take 2 capsules (200 mg total) by mouth at bedtime. 10/01/19  Yes Cameron Sprang, MD  polyethylene glycol (MIRALAX / GLYCOLAX) 17 g packet Take 17 g by mouth daily as needed for mild constipation. Patient taking differently: Take 17 g by mouth daily as needed for mild constipation (MIX INTO WATER AS DIRECTED AND DRINK).  10/24/18  Yes Mercy Riding, MD  warfarin (COUMADIN) 2.5 MG tablet TAKE AS DIRECTED BY COUMADIN CLINIC Patient taking differently: Take 2.5-5 mg by mouth See admin instructions. Take 5 mg by mouth at 6 PM in the evening on Sun/Tues/Wed/Thurs/Sat and 2.5 mg on Mon/Fri 10/15/19  Yes Allred, Jeneen Rinks, MD    Allergies    Patient has no known allergies.  Review of Systems   Review of Systems  Constitutional: Positive for appetite change and fatigue. Negative for  chills and fever.  HENT: Negative for congestion and rhinorrhea.   Respiratory: Positive for cough. Negative for shortness of breath.   Cardiovascular: Negative for chest pain, palpitations, syncope and near-syncope.  Gastrointestinal: Negative for abdominal pain, diarrhea, nausea and vomiting.  Genitourinary: Negative for difficulty urinating and dysuria.  Musculoskeletal: Negative for arthralgias and back pain.  Skin: Negative for color change and rash.  Neurological: Positive for dizziness and weakness. Negative for light-headedness and headaches.  Hematological: Bruises/bleeds easily.    Physical Exam Updated Vital Signs BP (!) 133/67   Pulse (!) 116   Temp (!) 100.5  F (38.1 C) (Oral)   Resp (!) 26   Ht 6' (1.829 m)   Wt 55 kg   SpO2 98%   BMI 16.44 kg/m   Physical Exam Vitals and nursing note reviewed.  Constitutional:      General: He is not in acute distress.    Appearance: Normal appearance.  HENT:     Head: Normocephalic and atraumatic.     Nose: No rhinorrhea.  Eyes:     General:        Right eye: No discharge.        Left eye: No discharge.     Conjunctiva/sclera: Conjunctivae normal.  Cardiovascular:     Rate and Rhythm: Regular rhythm. Tachycardia present.  Pulmonary:     Effort: Pulmonary effort is normal. No respiratory distress.     Breath sounds: No stridor. No rhonchi.     Comments: Transmitted upper airway sounds of congestion Abdominal:     General: Abdomen is flat. There is distension.     Palpations: Abdomen is soft.     Tenderness: There is no abdominal tenderness. There is no guarding.  Musculoskeletal:        General: No deformity or signs of injury.  Skin:    General: Skin is warm and dry.     Comments: Scattered bruising on the extremities  Neurological:     General: No focal deficit present.     Mental Status: He is alert. Mental status is at baseline.     Motor: No weakness.  Psychiatric:        Mood and Affect: Mood normal.        Behavior: Behavior normal.        Thought Content: Thought content normal.     ED Results / Procedures / Treatments   Labs (all labs ordered are listed, but only abnormal results are displayed) Labs Reviewed  BASIC METABOLIC PANEL - Abnormal; Notable for the following components:      Result Value   Potassium 3.4 (*)    Glucose, Bld 239 (*)    All other components within normal limits  CBC - Abnormal; Notable for the following components:   WBC 18.1 (*)    RBC 3.33 (*)    Hemoglobin 9.3 (*)    HCT 30.5 (*)    RDW 20.3 (*)    All other components within normal limits  LACTIC ACID, PLASMA - Abnormal; Notable for the following components:   Lactic Acid, Venous 3.2  (*)    All other components within normal limits  LACTIC ACID, PLASMA - Abnormal; Notable for the following components:   Lactic Acid, Venous 3.1 (*)    All other components within normal limits  PROTIME-INR - Abnormal; Notable for the following components:   Prothrombin Time 25.3 (*)    INR 2.4 (*)    All other components within normal limits  HEPATIC FUNCTION PANEL - Abnormal; Notable for the following components:   AST 46 (*)    All other components within normal limits  BRAIN NATRIURETIC PEPTIDE - Abnormal; Notable for the following components:   B Natriuretic Peptide 391.1 (*)    All other components within normal limits  TROPONIN I (HIGH SENSITIVITY) - Abnormal; Notable for the following components:   Troponin I (High Sensitivity) 18 (*)    All other components within normal limits  TROPONIN I (HIGH SENSITIVITY) - Abnormal; Notable for the following components:   Troponin I (High Sensitivity) 22 (*)    All other components within normal limits  CULTURE, BLOOD (ROUTINE X 2)  CULTURE, BLOOD (ROUTINE X 2)  PHENYTOIN LEVEL, TOTAL    EKG EKG Interpretation  Date/Time:  Sunday October 21 2019 17:07:34 EDT Ventricular Rate:  141 PR Interval:    QRS Duration: 134 QT Interval:  303 QTC Calculation: 464 R Axis:   59 Text Interpretation: Wide-QRS tachycardia Ventricular premature complex Right bundle branch block other than tachycardia, similar to ecg on 07/30 Confirmed by Dewaine Conger [phone]) on 10/21/2019 5:11:13 PM   Radiology DG Chest 2 View  Result Date: 10/21/2019 CLINICAL DATA:  Cough, chest pain, weakness EXAM: CHEST - 2 VIEW COMPARISON:  Chest x-rays dated 10/19/2019 and 09/09/2019. FINDINGS: Heart size and mediastinal contours are stable. Lungs are hyperexpanded. Retrocardiac opacity, pneumonia versus atelectasis. No pleural effusion or pneumothorax is seen. No acute appearing osseous abnormality. IMPRESSION: 1. Retrocardiac opacity, pneumonia versus atelectasis. 2.  Hyperexpanded lungs suggesting COPD. Electronically Signed   By: Franki Cabot [name]   On: 10/21/2019 16:41   DG Scapula Left  Result Date: 10/20/2019 CLINICAL DATA:  Fall and scapula pain EXAM: LEFT SCAPULA - 2+ VIEWS COMPARISON:  None. FINDINGS: There is no evidence of fracture or other focal bone lesions. Soft tissues are unremarkable. Glenohumeral joint and AC joint arthrosis is seen. IMPRESSION: Negative. Electronically Signed   By: Prudencio Pair [name]   On: 10/20/2019 00:15   CT Head Wo Contrast  Result Date: 10/19/2019 CLINICAL DATA:  Syncope EXAM: CT HEAD WITHOUT CONTRAST TECHNIQUE: Contiguous axial images were obtained from the base of the skull through the vertex without intravenous contrast. COMPARISON:  CT brain 12/06/2016 FINDINGS: Brain: No acute territorial infarction, hemorrhage or new intracranial mass. Encephalomalacia in the left temporal lobe as before. Moderate atrophy. Mild hypodensity in the white matter consistent with chronic small vessel ischemic change. Stable 1.2 cm partially calcified extra-axial mass along the right cranial vertex likely a meningioma. Stable ventricle size. Vascular: No hyperdense vessels.  Carotid vascular calcification. Skull: Normal. Negative for fracture or focal lesion. Sinuses/Orbits: Mild mucosal thickening in the ethmoid and maxillary sinuses. Old appearing deformity medial wall right orbit Other: None IMPRESSION: 1. No CT evidence for acute intracranial abnormality. 2. Atrophy and mild chronic small vessel ischemic changes of the white matter. Remote left temporal lobe infarct. 3. Stable 1.2 cm partially calcified extra-axial mass along the right cranial vertex, likely meningioma. Electronically Signed   By: Donavan Foil [name]   On: 10/19/2019 23:07    Procedures Procedures (including critical care time)  Medications Ordered in ED Medications  acetaminophen (TYLENOL) tablet 650 mg (has no administration in time range)  lactated ringers bolus 1,000  mL (1,000 mLs Intravenous New Bag/Given 10/21/19 1824)  piperacillin-tazobactam (ZOSYN) IVPB 3.375 g (0 g Intravenous Stopped 10/21/19 1930)  vancomycin (VANCOREADY) IVPB 1250 mg/250 mL (0 mg Intravenous Stopped 10/21/19 2035)  amiodarone (NEXTERONE) IV bolus only  150 mg/100 mL (0 mg Intravenous Stopped 10/21/19 1930)    ED Course  I have reviewed the triage vital signs and the nursing notes.  Pertinent labs & imaging results that were available during my care of the patient were reviewed by me and considered in my medical decision making (see chart for details).    MDM Rules/Calculators/A&P                          Presents for fatigue.  Comes in with cough as well.  Afebrile.  No sick contacts.  Evaluation done in triage shows a chest x-ray which are retrocardiac opacity, cervical region and seen by radiology and reviewed by myself.  Lactic acidosis.  He is given a bolus of lactated Ringer's.  Kidney function is adequate.  Blood cultures will be sent, Zosyn and vancomycin will be given his been hospitalized recently.  Concerns for hospital associated pneumonia.  Covid test negative less than 48 hours ago.  Patient is mentating well, and is resting comfortably with normal work of breathing for him.  Pt ecg shows Afib with RVR, and I plan on using his home amiodarone via IV bolus, if that does not work we can try other rate control measures.  He will need admission for his pneumonia, consulted the hospitalist team.  He is blood pressure is stable he has no chest pain and he has normal mental status for him.  Patient is resting comfortably on room air.  Heart rate is in the 1 teens 120s.  He is people take oral meds, has a fever now.  I do feel strongly with bacterial pneumonia.  He was vaccinated for Covid and has a negative Covid test 2 days ago.  Spoke to the hospitalist they feel that that is adequate for testing of Covid.  And they requested Dilantin level and they will take.  As an inpatient.  The  patient will be admitted to the hospitalist.  For the remainder this patient's care please see inpatient team notes.  I will intervene as needed while the patient remains in the emergency department.  Final Clinical Impression(s) / ED Diagnoses Final diagnoses:  Healthcare-associated pneumonia    Rx / DC Orders ED Discharge Orders    None       Breck Coons, MD 10/21/19 2042

## 2019-10-21 NOTE — Progress Notes (Signed)
ANTICOAGULATION CONSULT NOTE - Follow Up Consult  Pharmacy Consult for Warfarin Indication: atrial fibrillation  No Known Allergies  Patient Measurements: Height: 6' (182.9 cm) Weight: 55 kg (121 lb 4.1 oz) IBW/kg (Calculated) : 77.6  Vital Signs: Temp: 100.5 F (38.1 C) (08/01 1945) Temp Source: Oral (08/01 1945) BP: 113/92 (08/01 2100) Pulse Rate: 125 (08/01 2100)  Labs: Recent Labs    10/19/19 1900 10/21/19 1607 10/21/19 1742 10/21/19 1950  HGB 9.6* 9.3*  --   --   HCT 31.7* 30.5*  --   --   PLT 250 316  --   --   APTT 40*  --   --   --   LABPROT 33.0*  --  25.3*  --   INR 3.4*  --  2.4*  --   CREATININE 0.82 0.86  --   --   TROPONINIHS 4  --  18* 22*    Estimated Creatinine Clearance: 53.3 mL/min (by C-G formula based on SCr of 0.86 mg/dL).  Assessment: 79 year old male on warfarin PTA for Afib, INR therapeutic on admission at 2.4. Dose prior to admission - > 5 mg all days except 2.5 mg on MF Last dose 7/31  Goal of Therapy:  INR 2-3 Monitor platelets by anticoagulation protocol: Yes   Plan:  Warfarin 5 mg po x 1 dose tonight Daily INR for now  Thank you Anette Guarneri, PharmD  10/21/2019,9:33 PM

## 2019-10-21 NOTE — H&P (Addendum)
[name] KXF:[id number] DOB: [birth year] DOA: 10/21/2019   PCP: [name], MD   Outpatient Specialists:  CARDS:  Dr. Gwenlyn Found    NEurology    Dr. [name]   Patient arrived to ER on 10/21/19 at 1553 Referred by Attending Breck Coons, MD   Patient coming from: home Lives alone,     Chief Complaint:    Chief Complaint  Patient presents with  . Weakness    HPI: [name] is a 79 y.o. male with medical history significant of A.fib  On Coumadin, Seizure DO, HTN , GERD, CVA history of pancreatitis, history of hypertension    Presented with generalized fatigue, increased cough and today low grade fever. Was seen in ER on 7/30 for syncopal event.  Patient's family called 911 he was evaluated in emergency department and discharged to home. Daughter has voiced her concerns regarding amiodarone she states ever since he was started he has not been himself.  He has had a number of ER visits since then.  Patient's daughter is concerned that the milligrams was causing weakness and malaise. Recently was admitted for G Bleed Was recently admitted with GI bleed in end of June. Her coumadin had to be reversed. Pt was transfused at discharge hg was 8.7, GI rec restarting coumadin  He has been taking his meds  Infectious risk factors:  Report fatigue    Has   been vaccinated against COVID    Initial COVID TEST  NEGATIVE   Lab Results  Component Value Date   SARSCOV2NAA NEGATIVE 10/19/2019   [place] NEGATIVE 09/09/2019   [place] NEGATIVE 07/25/2019   [place] NEGATIVE 07/21/2019     Regarding pertinent Chronic problems:        Seizure DO on Dilantin and Keppra     COPD - not  followed by pulmonology         Hx of CVA - with/out residual deficits on coumadin   A. Fib -  - CHA2DS2 vas score  5      current  on anticoagulation with  Coumadin           - Rhythm control:  amiodarone,     Chronic anemia - baseline hg Hemoglobin & Hematocrit    Recent Labs    09/13/19 0814 10/19/19 1900 10/21/19 1607  HGB 7.7* 9.6* 9.3*    While in ER: Noted to have potassium down to 3.4 glucose 239 hemoglobin stable at 9.3 Elevated lactic acid up to 3.2 Chest x-ray was worrisome for new pneumonia  he was started on Zosyn and vancomycin given hospital-acquired pneumonia concerns. She was noted to have A. fib with RVR His amiodarone was given to him IV    Hospitalist was called for admission for Pneumonia resulting in sepsis On labs The following Work up has been ordered so far:  Orders Placed This Encounter  Procedures  . Blood culture (routine x 2)  . SARS Coronavirus 2 by RT PCR (hospital order, performed in [name] hospital lab) Nasopharyngeal Nasopharyngeal Swab  . DG Chest 2 View  . Basic metabolic panel  . CBC  . Lactic acid, plasma  . Protime-INR  . Hepatic function panel  . Brain natriuretic peptide  . Cardiac monitoring  . Document Height and Actual Weight  . Saline Lock IV, Maintain IV access  . Consult to internal medicine  ALL PATIENTS BEING ADMITTED/HAVING PROCEDURES NEED COVID-19 SCREENING  . Consult to hospitalist  ALL PATIENTS BEING ADMITTED/HAVING PROCEDURES NEED COVID-19  SCREENING  . Pulse oximetry, continuous  . ED EKG  . EKG 12-Lead  . EKG 12-Lead  . EKG 12-Lead    Following Medications were ordered in ER: Medications  vancomycin (VANCOREADY) IVPB 1250 mg/250 mL (1,250 mg Intravenous New Bag/Given 10/21/19 1900)  lactated ringers bolus 1,000 mL (1,000 mLs Intravenous New Bag/Given 10/21/19 1824)  piperacillin-tazobactam (ZOSYN) IVPB 3.375 g (0 g Intravenous Stopped 10/21/19 1930)  amiodarone (NEXTERONE) IV bolus only 150 mg/100 mL (0 mg Intravenous Stopped 10/21/19 1930)        Consult Orders  (From admission, onward)         Start     Ordered   10/21/19 1949  Consult to hospitalist  ALL PATIENTS BEING ADMITTED/HAVING PROCEDURES NEED COVID-19 SCREENING Paged by Pollie Friar  Once       Comments: ALL  PATIENTS BEING ADMITTED/HAVING PROCEDURES NEED COVID-19 SCREENING  Provider:  (Not yet assigned)  Question Answer Comment  Place call to: [facility]   Reason for Consult Admit      10/21/19 1948           Significant initial  Findings: Abnormal Labs Reviewed  BASIC METABOLIC PANEL - Abnormal; Notable for the following components:      Result Value   Potassium 3.4 (*)    Glucose, Bld 239 (*)    All other components within normal limits  CBC - Abnormal; Notable for the following components:   WBC 18.1 (*)    RBC 3.33 (*)    Hemoglobin 9.3 (*)    HCT 30.5 (*)    RDW 20.3 (*)    All other components within normal limits  LACTIC ACID, PLASMA - Abnormal; Notable for the following components:   Lactic Acid, Venous 3.2 (*)    All other components within normal limits  LACTIC ACID, PLASMA - Abnormal; Notable for the following components:   Lactic Acid, Venous 3.1 (*)    All other components within normal limits  PROTIME-INR - Abnormal; Notable for the following components:   Prothrombin Time 25.3 (*)    INR 2.4 (*)    All other components within normal limits  HEPATIC FUNCTION PANEL - Abnormal; Notable for the following components:   AST 46 (*)    All other components within normal limits  BRAIN NATRIURETIC PEPTIDE - Abnormal; Notable for the following components:   B Natriuretic Peptide 391.1 (*)    All other components within normal limits  TROPONIN I (HIGH SENSITIVITY) - Abnormal; Notable for the following components:   Troponin I (High Sensitivity) 18 (*)    All other components within normal limits    Otherwise labs showing:    Recent Labs  Lab 10/19/19 1900 10/21/19 1607  NA 138 135  K 4.3 3.4*  CO2 28 24  GLUCOSE 149* 239*  BUN 13 14  CREATININE 0.82 0.86  CALCIUM 8.7* 8.9    Cr  Stable,  Lab Results  Component Value Date   CREATININE 0.86 10/21/2019   CREATININE 0.82 10/19/2019   CREATININE 0.66 09/13/2019    Recent Labs  Lab 10/19/19 1900  10/21/19 1742  AST 36 46*  ALT 21 23  ALKPHOS 93 77  BILITOT 0.5 0.7  PROT 7.3 7.4  ALBUMIN 3.8 3.6   Lab Results  Component Value Date   CALCIUM 8.9 10/21/2019   PHOS 3.1 07/21/2019     WBC      Component Value Date/Time   WBC 18.1 (H) 10/21/2019 1607   ANC  Component Value Date/Time   NEUTROABS 12.3 (H) 10/19/2019 1900   ALC No components found for: LYMPHAB    Plt: Lab Results  Component Value Date   PLT 316 10/21/2019    Lactic Acid, Venous    Component Value Date/Time   LATICACIDVEN 3.1 (HH) 10/21/2019 1743    Procalcitonin  Ordered   COVID-19 Labs  No results for input(s): DDIMER, FERRITIN, LDH, CRP in the last 72 hours.  Lab Results  Component Value Date   SARSCOV2NAA NEGATIVE 10/19/2019   [place] NEGATIVE 09/09/2019   SARSCOV2NAA NEGATIVE 07/25/2019   [place] NEGATIVE 07/21/2019     HG/HCT   stable,       Component Value Date/Time   HGB 9.3 (L) 10/21/2019 1607   HCT 30.5 (L) 10/21/2019 1607    Troponin 18 -22 Cardiac Panel (last 3 results)     ECG: Ordered Personally reviewed by me showing: HR :118 Rhythm  A.fib. W RVR, RBBB,    no evidence of ischemic changes QTC 537   BNP (last 3 results) Recent Labs    10/21/19 1742  BNP 391.1*    DM  labs:  HbA1C: Recent Labs    09/10/19 0257  HGBA1C 5.3       CBG (last 3)  No results for input(s): GLUCAP in the last 72 hours.     UA   no evidence of UTI     Urine analysis:    Component Value Date/Time   COLORURINE YELLOW 10/21/2019 2050   APPEARANCEUR HAZY (A) 10/21/2019 2050   LABSPEC 1.028 10/21/2019 2050   PHURINE 5.0 10/21/2019 2050   GLUCOSEU >=500 (A) 10/21/2019 2050   HGBUR SMALL (A) 10/21/2019 2050   BILIRUBINUR NEGATIVE 10/21/2019 2050   KETONESUR NEGATIVE 10/21/2019 2050   PROTEINUR 100 (A) 10/21/2019 2050   UROBILINOGEN 0.2 06/02/2011 0300   NITRITE NEGATIVE 10/21/2019 2050   LEUKOCYTESUR NEGATIVE 10/21/2019 2050      Ordered    CXR -  Pneumonia vs atelectasis      ED Triage Vitals  Enc Vitals Group     BP 10/21/19 1600 (!) 146/112     Pulse Rate 10/21/19 1600 (!) 111     Resp 10/21/19 1600 18     Temp 10/21/19 1600 98.8 F (37.1 C)     Temp Source 10/21/19 1600 Oral     SpO2 10/21/19 1600 91 %     Weight 10/21/19 1603 121 lb 4.1 oz (55 kg)     Height 10/21/19 1603 6' (1.829 m)     Head Circumference --      Peak Flow --      Pain Score 10/21/19 1603 8     Pain Loc --      Pain Edu? --      Excl. in [place]? --   TMAX(24)@       Latest  Blood pressure (!) 133/67, pulse (!) 116, temperature (!) 100.5 F (38.1 C), temperature source Oral, resp. rate (!) 26, height 6' (1.829 m), weight 55 kg, SpO2 98 %.     Review of Systems:    Pertinent positives include:  Fevers, chills, fatigue,   Constitutional:  No weight loss, night sweats, weight loss  HEENT:  No headaches, Difficulty swallowing,Tooth/dental problems,Sore throat,  No sneezing, itching, ear ache, nasal congestion, post nasal drip,  Cardio-vascular:  No chest pain, Orthopnea, PND, anasarca, dizziness, palpitations.no Bilateral lower extremity swelling  GI:  No heartburn, indigestion, abdominal pain, nausea, vomiting, diarrhea, change in bowel  habits, loss of appetite, melena, blood in stool, hematemesis Resp:  no shortness of breath at rest. No dyspnea on exertion, No excess mucus, no productive cough, No non-productive cough, No coughing up of blood.No change in color of mucus.No wheezing. Skin:  no rash or lesions. No jaundice GU:  no dysuria, change in color of urine, no urgency or frequency. No straining to urinate.  No flank pain.  Musculoskeletal:  No joint pain or no joint swelling. No decreased range of motion. No back pain.  Psych:  No change in mood or affect. No depression or anxiety. No memory loss.  Neuro: no localizing neurological complaints, no tingling, no weakness, no double vision, no gait abnormality, no slurred speech, no  confusion  All systems reviewed and apart from [place] all are negative  Past Medical History:   Past Medical History:  Diagnosis Date  . Hiatal hernia   . HTN (hypertension)    pt denies h/o HTN though it appears he was started on spironolactone during his hospitalization 3/13  . Pancreatitis   . Seizures ([place])   . Stroke ([place])   . Typical atrial flutter ([place]) 3/13     Past Surgical History:  Procedure Laterality Date  . ESOPHAGOGASTRODUODENOSCOPY N/A 11/30/2015   Procedure: ESOPHAGOGASTRODUODENOSCOPY (EGD);  Surgeon: Wonda Horner, MD;  Location: [facility] ENDOSCOPY;  Service: Endoscopy;  Laterality: N/A;  . ESOPHAGOGASTRODUODENOSCOPY N/A 09/11/2019   Procedure: ESOPHAGOGASTRODUODENOSCOPY (EGD);  Surgeon: Wonda Horner, MD;  Location: [facility] ENDOSCOPY;  Service: Endoscopy;  Laterality: N/A;  . ESOPHAGOGASTRODUODENOSCOPY (EGD) WITH PROPOFOL N/A 07/23/2018   Procedure: ESOPHAGOGASTRODUODENOSCOPY (EGD) WITH PROPOFOL;  Surgeon: Otis Brace, MD;  Location: [facility] ENDOSCOPY;  Service: Gastroenterology;  Laterality: N/A;  . FEMUR IM NAIL Left 12/08/2016  . FEMUR IM NAIL Left 12/07/2016   Procedure: INTRAMEDULLARY (IM) NAIL FEMORAL;  Surgeon: Renette Butters, MD;  Location: [facility];  Service: Orthopedics;  Laterality: Left;  . GIVENS CAPSULE STUDY N/A 07/24/2018   Procedure: GIVENS CAPSULE STUDY;  Surgeon: Otis Brace, MD;  Location: [place];  Service: Gastroenterology;  Laterality: N/A;  . HIP SURGERY Left 01/2017  . HOT HEMOSTASIS N/A 07/23/2018   Procedure: HOT HEMOSTASIS (ARGON PLASMA COAGULATION/BICAP);  Surgeon: Otis Brace, MD;  Location[facility] ENDOSCOPY;  Service: Gastroenterology;  Laterality: N/A;  . HOT HEMOSTASIS N/A 09/11/2019   Procedure: HOT HEMOSTASIS (ARGON PLASMA COAGULATION/BICAP);  Surgeon: Wonda Horner, MD;  Location: [facility] ENDOSCOPY;  Service: Endoscopy;  Laterality: N/A;  . no surgical history  06/2011    Social History:  Ambulatory   Gilford Rile     reports that he quit  smoking about 53 years ago. He has never used smokeless tobacco. He reports that he does not drink alcohol and does not use drugs.   Family History:   Family History  Problem Relation Age of Onset  . Hypertension Other     Allergies: No Known Allergies   Prior to Admission medications   Medication Sig Start Date End Date Taking? Authorizing Provider  amiodarone (PACERONE) 200 MG tablet Take 1 tablet (200 mg total) by mouth daily. Patient taking differently: Take 200 mg by mouth at bedtime.  08/22/19  Yes Fenton, Clint R, PA  feeding supplement, ENSURE ENLIVE, (ENSURE ENLIVE) LIQD Take 237 mLs by mouth 3 (three) times daily between meals. Patient taking differently: Take 237 mLs by mouth 3 (three) times daily as needed (for supplementation).  07/26/19  Yes Hosie Poisson, MD  ferrous sulfate 325 (65 FE) MG tablet Take 1 tablet (  325 mg total) by mouth 3 (three) times daily with meals. Patient taking differently: Take 325 mg by mouth daily with breakfast.  12/15/16  Yes Velvet Bathe, MD  folic acid (FOLVITE) 1 MG tablet Take 1 tablet (1 mg total) by mouth daily. 10/25/18  Yes Mercy Riding, MD  levETIRAcetam (KEPPRA) 500 MG tablet TAKE 3 TABLETS BY MOUTH TWICE A DAY Patient taking differently: Take 1,500 mg by mouth in the morning and at bedtime.  10/01/19  Yes Cameron Sprang, MD  Multiple Vitamin (MULTIVITAMIN WITH MINERALS) TABS tablet Take 1 tablet by mouth daily. 07/26/19  Yes Hosie Poisson, MD  pantoprazole (PROTONIX) 40 MG tablet Take 1 tablet (40 mg total) by mouth daily at 6 (six) AM. 09/13/19  Yes Eugenie Filler, MD  phenytoin (DILANTIN) 100 MG ER capsule Take 2 capsules (200 mg total) by mouth at bedtime. 10/01/19  Yes Cameron Sprang, MD  polyethylene glycol (MIRALAX / GLYCOLAX) 17 g packet Take 17 g by mouth daily as needed for mild constipation. Patient taking differently: Take 17 g by mouth daily as needed for mild constipation (MIX INTO WATER AS DIRECTED AND DRINK).  10/24/18  Yes  Mercy Riding, MD  warfarin (COUMADIN) 2.5 MG tablet TAKE AS DIRECTED BY COUMADIN CLINIC Patient taking differently: Take 2.5-5 mg by mouth See admin instructions. Take 5 mg by mouth at 6 PM in the evening on Sun/Tues/Wed/Thurs/Sat and 2.5 mg on Mon/Fri 10/15/19  Yes Allred, Jeneen Rinks, MD   Physical Exam: Vitals with BMI 10/21/2019 10/21/2019 10/21/2019  Height - - -  Weight - - -  BMI - - -  Systolic 353 614 431  Diastolic 67 68 89  Pulse 540 115 126     1. General:  in No Acute distress   Chronically ill  -appearing 2. Psychological: Alert and  Oriented 3. Head/ENT:     Dry Mucous Membranes                          Head Non traumatic, neck supple                         Poor Dentition 4. SKIN decreased Skin turgor,  Skin clean Dry and intact no rash 5. Heart: Regular rate and rhythm no  Murmur, no Rub or gallop 6. Lungs:   no wheezes or crackles   7. Abdomen: Soft,  non-tender,  distended  bowel sounds present 8. Lower extremities: no clubbing, cyanosis, no edema 9. Neurologically Grossly intact, moving all 4 extremities equally   10. MSK: Normal range of motion   All other LABS:     Recent Labs  Lab 10/19/19 1900 10/21/19 1607  WBC 14.3* 18.1*  NEUTROABS 12.3*  --   HGB 9.6* 9.3*  HCT 31.7* 30.5*  MCV 93.0 91.6  PLT 250 316     Recent Labs  Lab 10/19/19 1900 10/21/19 1607  NA 138 135  K 4.3 3.4*  CL 102 99  CO2 28 24  GLUCOSE 149* 239*  BUN 13 14  CREATININE 0.82 0.86  CALCIUM 8.7* 8.9     Recent Labs  Lab 10/19/19 1900 10/21/19 1742  AST 36 46*  ALT 21 23  ALKPHOS 93 77  BILITOT 0.5 0.7  PROT 7.3 7.4  ALBUMIN 3.8 3.6       Cultures:    Component Value Date/Time   SDES IN/OUT CATH URINE 10/19/2019 2150  [place] NONE 10/19/2019 2150   CULT  10/19/2019 2150    NO GROWTH Performed at [facility] [address] [place], [place] [address]    REPTSTATUS 10/21/2019 FINAL 10/19/2019 2150     Radiological Exams on Admission: DG Chest 2  View  Result Date: 10/21/2019 CLINICAL DATA:  Cough, chest pain, weakness EXAM: CHEST - 2 VIEW COMPARISON:  Chest x-rays dated 10/19/2019 and 09/09/2019. FINDINGS: Heart size and mediastinal contours are stable. Lungs are hyperexpanded. Retrocardiac opacity, pneumonia versus atelectasis. No pleural effusion or pneumothorax is seen. No acute appearing osseous abnormality. IMPRESSION: 1. Retrocardiac opacity, pneumonia versus atelectasis. 2. Hyperexpanded lungs suggesting COPD. Electronically Signed   By: Franki Cabot [name]   On: 10/21/2019 16:41   DG Scapula Left  Result Date: 10/20/2019 CLINICAL DATA:  Fall and scapula pain EXAM: LEFT SCAPULA - 2+ VIEWS COMPARISON:  None. FINDINGS: There is no evidence of fracture or other focal bone lesions. Soft tissues are unremarkable. Glenohumeral joint and AC joint arthrosis is seen. IMPRESSION: Negative. Electronically Signed   By: Prudencio Pair [name]   On: 10/20/2019 00:15   CT Head Wo Contrast  Result Date: 10/19/2019 CLINICAL DATA:  Syncope EXAM: CT HEAD WITHOUT CONTRAST TECHNIQUE: Contiguous axial images were obtained from the base of the skull through the vertex without intravenous contrast. COMPARISON:  CT brain 12/06/2016 FINDINGS: Brain: No acute territorial infarction, hemorrhage or new intracranial mass. Encephalomalacia in the left temporal lobe as before. Moderate atrophy. Mild hypodensity in the white matter consistent with chronic small vessel ischemic change. Stable 1.2 cm partially calcified extra-axial mass along the right cranial vertex likely a meningioma. Stable ventricle size. Vascular: No hyperdense vessels.  Carotid vascular calcification. Skull: Normal. Negative for fracture or focal lesion. Sinuses/Orbits: Mild mucosal thickening in the ethmoid and maxillary sinuses. Old appearing deformity medial wall right orbit Other: None IMPRESSION: 1. No CT evidence for acute intracranial abnormality. 2. Atrophy and mild chronic small vessel ischemic  changes of the white matter. Remote left temporal lobe infarct. 3. Stable 1.2 cm partially calcified extra-axial mass along the right cranial vertex, likely meningioma. Electronically Signed   By: Donavan Foil [name]   On: 10/19/2019 23:07    Chart has been reviewed   Assessment/Plan  79 y.o. male with medical history significant of A.fib  On Coumadin, Seizure DO, HTN , GERD, CVA history of pancreatitis, history of hypertension  Admitted for Pneumonia  Present on Admission: . CAP (community acquired pneumonia) - will admit for treatment of CAP will start on appropriate antibiotic coverage.   Obtain:  sputum cultures,                  respiratory panel                   blood cultures                   strep pneumo UA antigen,                                   Provide oxygen as needed.   [place] CIRRHOSIS, ALCOHOLIC, LIVER - LFT wNL, no longer drinks  . Dementia (AD) - stable watch for sundowning, Acute encphalopathy in the setting of infection and sepsis  Hyperglycemia - no prior diagnosis of DM , no steroid use will order SSI Monitor BG, check Hga1C order diabetes coordinator consult  . Essential  hypertension - chronic allow permissive hTN for tonight given sepsis  . Localization-related symptomatic epilepsy and epileptic syndromes with complex partial  seizures, not intractable, without status epilepticus ([place]) - cekc Dilantin level, Continue homemeds  . Normochromic anemia - HG up from baseline cont to monitor  . Paroxysmal atrial flutter (HCC) -          - CHA2DS2 vas score 5 continue current anticoagulation with Coumadin per pharmacy,          -  Rate control: patient remains tachycardiac, rehydrate and control fever if tachycardia persists my try Cardizem IV        - Rhythm control:  Continue amiodarone  . Sepsis ([place]) -   -Patient meets sepsis criteria with    fever     leukocytosis  Tachycardia   Initial lactic acid Lactic Acid, Venous    Component Value Date/Time    LATICACIDVEN 3.1 ([place]) 10/21/2019 1743   Source most likely: pneumonia,    -We will rehydrate, treat with IV antibiotics, follow lactic acid - Await results of blood and urine culture and adjust antibiotics as needed   - Obtain respiratory panel    Hyperglycemia - check HgA1c Order sliding scale  abd distention/Diarrhea -  KUB came back abnormal, will order CT abd/pelvis, keep NPO Family reported some diarrhea, pt denies any abd pain, but abdominal exam showing abd distention and given elevated lactic acid will obtain additional imaging 10:41 PM   CT abd showed possible Ileus vs Obstruction - Would need further work up, hold coumadin, keep NPO, Will need to discuss with GI and possibly general surgery in AM. No evidence of free air or perforation at this time.   Lung finding worrisome for aspiration will change Ceftriaxone to Unasyn 12:10 AM Order SLP Other plan as per orders.  DVT prophylaxis: on coumadin    Code Status:    Code Status: Prior FULL CODE   as per patient   I had personally discussed CODE STATUS with patient      Family Communication:   Family   at  Bedside  plan of care was discussed   With  Daughter,  Disposition Plan:   To home once workup is complete and patient is stable   Following barriers for discharge:                                                         Afebrile, white count improving able to transition to PO antibiotics                                                 Would benefit from PT/OT eval prior to DC  Ordered                   Swallow eval - SLP ordered                   Diabetes care coordinator                   Transition of care consulted                   Nutrition  consulted                                     Consults called: notified cardiology through e-mail, Notified Dr. Cristina Gong through smartchat  Admission status:  ED Disposition    ED Disposition Condition Comment   Admit  The patient appears reasonably stabilized for  admission considering the current resources, flow, and capabilities available in the ED at this time, and I doubt any other [facility] requiring further screening and/or treatment in the ED prior to admission is  present.       Obs    Level of care         SDU tele indefinitely please discontinue once patient no longer qualifies COVID-19 Labs     Lab Results  Component Value Date   [place] NEGATIVE 10/19/2019     Precautions: admitted as  Covid Negative      PPE: Used by the provider:   N95  eye Goggles,  Gloves      Candee Hoon 10/21/2019, 9:40 PM    [facility]     after 2 AM please page floor coverage PA If 7AM-7PM, please contact the day team taking care of the patient using [email or web address]   Patient was evaluated in the context of the global COVID-19 pandemic, which necessitated consideration that the patient might be at risk for infection with the SARS-CoV-2 virus that causes COVID-19. Institutional protocols and algorithms that pertain to the evaluation of patients at risk for COVID-19 are in a state of rapid change based on information released by regulatory bodies including the CDC and federal and state organizations. These policies and algorithms were followed during the patient's care.

## 2019-10-21 NOTE — Progress Notes (Signed)
[facility] for Warfarin Indication: atrial fibrillation  No Known Allergies  Patient Measurements: Height: 6' (182.9 cm) Weight: 55 kg (121 lb 4.1 oz) IBW/kg (Calculated) : 77.6 Heparin Dosing Weight:   Vital Signs: Temp: 100.5 F (38.1 C) (08/01 1945) Temp Source: Oral (08/01 1945) BP: 113/92 (08/01 2100) Pulse Rate: 125 (08/01 2100)  Labs: Recent Labs    10/19/19 1900 10/21/19 1607 10/21/19 1742 10/21/19 1950  HGB 9.6* 9.3*  --   --   HCT 31.7* 30.5*  --   --   PLT 250 316  --   --   APTT 40*  --   --   --   LABPROT 33.0*  --  25.3*  --   INR 3.4*  --  2.4*  --   CREATININE 0.82 0.86  --   --   TROPONINIHS 4  --  18* 22*    Estimated Creatinine Clearance: 53.3 mL/min (by C-G formula based on SCr of 0.86 mg/dL).   Medical History: Past Medical History:  Diagnosis Date  . Hiatal hernia   . HTN (hypertension)    pt denies h/o HTN though it appears he was started on spironolactone during his hospitalization 3/13  . Pancreatitis   . Seizures ([place])   . Stroke ([place])   . Typical atrial flutter ([place]) 3/13    Medications:  Scheduled:  . insulin aspart  0-9 Units Subcutaneous Q4H  . warfarin  2.5 mg Oral Once  . [START ON 10/22/2019] Warfarin - Pharmacist Dosing Inpatient   Does not apply q1600    Assessment: Patient is an 22 yom that is being admitted with sepsis. Patient is on warfarin at home for Afib. Pharmacy has been asked to dose warfarin while inpatient.   Home regimen: 2.5mg  MF and 5mg  ROW  Goal of Therapy:  INR 2-3 Monitor platelets by anticoagulation protocol: Yes   Plan:  - INR currently therapeutic at 2.4  - Give patient Warfarin 2.5mg  PO x 1 dose today  - Reduced dose slightly as being admitted and antibiotics given  - Monitor patient for s/s of bleeding and cbc   Duanne Limerick PharmD. BCPS 10/21/2019,9:35 PM

## 2019-10-21 NOTE — ED Triage Notes (Signed)
Pt arrives POV for eval of ongoing weakness. Seen here on 7/30 for same, but daughter reports worse. Pt does appear pale, hx of GI bleed, on coumadin for afib. Daughter also reports pt has been constantly clearing throat since that time. Pt denies swelling or difficulty breathing. Oropharynx is patent and clear, no obvious secretions.

## 2019-10-22 ENCOUNTER — Other Ambulatory Visit: Payer: Self-pay | Admitting: *Deleted

## 2019-10-22 ENCOUNTER — Encounter (HOSPITAL_COMMUNITY): Payer: Self-pay | Admitting: Internal Medicine

## 2019-10-22 ENCOUNTER — Telehealth (HOSPITAL_COMMUNITY): Payer: Self-pay | Admitting: Physician Assistant

## 2019-10-22 ENCOUNTER — Other Ambulatory Visit: Payer: Self-pay

## 2019-10-22 ENCOUNTER — Ambulatory Visit: Payer: Self-pay | Admitting: *Deleted

## 2019-10-22 ENCOUNTER — Inpatient Hospital Stay (HOSPITAL_COMMUNITY): Payer: Medicare Other

## 2019-10-22 DIAGNOSIS — F0390 Unspecified dementia without behavioral disturbance: Secondary | ICD-10-CM | POA: Diagnosis present

## 2019-10-22 DIAGNOSIS — I4891 Unspecified atrial fibrillation: Secondary | ICD-10-CM | POA: Insufficient documentation

## 2019-10-22 DIAGNOSIS — I4892 Unspecified atrial flutter: Secondary | ICD-10-CM | POA: Diagnosis not present

## 2019-10-22 DIAGNOSIS — I4819 Other persistent atrial fibrillation: Secondary | ICD-10-CM | POA: Diagnosis present

## 2019-10-22 DIAGNOSIS — G309 Alzheimer's disease, unspecified: Secondary | ICD-10-CM | POA: Diagnosis not present

## 2019-10-22 DIAGNOSIS — M6281 Muscle weakness (generalized): Secondary | ICD-10-CM | POA: Diagnosis not present

## 2019-10-22 DIAGNOSIS — K56609 Unspecified intestinal obstruction, unspecified as to partial versus complete obstruction: Secondary | ICD-10-CM | POA: Diagnosis not present

## 2019-10-22 DIAGNOSIS — J90 Pleural effusion, not elsewhere classified: Secondary | ICD-10-CM | POA: Diagnosis present

## 2019-10-22 DIAGNOSIS — Z7901 Long term (current) use of anticoagulants: Secondary | ICD-10-CM

## 2019-10-22 DIAGNOSIS — I10 Essential (primary) hypertension: Secondary | ICD-10-CM

## 2019-10-22 DIAGNOSIS — K7030 Alcoholic cirrhosis of liver without ascites: Secondary | ICD-10-CM

## 2019-10-22 DIAGNOSIS — A403 Sepsis due to Streptococcus pneumoniae: Secondary | ICD-10-CM | POA: Diagnosis not present

## 2019-10-22 DIAGNOSIS — I313 Pericardial effusion (noninflammatory): Secondary | ICD-10-CM | POA: Diagnosis present

## 2019-10-22 DIAGNOSIS — R531 Weakness: Secondary | ICD-10-CM | POA: Diagnosis not present

## 2019-10-22 DIAGNOSIS — J9601 Acute respiratory failure with hypoxia: Secondary | ICD-10-CM | POA: Diagnosis present

## 2019-10-22 DIAGNOSIS — Z79899 Other long term (current) drug therapy: Secondary | ICD-10-CM | POA: Diagnosis not present

## 2019-10-22 DIAGNOSIS — J156 Pneumonia due to other aerobic Gram-negative bacteria: Secondary | ICD-10-CM | POA: Diagnosis present

## 2019-10-22 DIAGNOSIS — M6259 Muscle wasting and atrophy, not elsewhere classified, multiple sites: Secondary | ICD-10-CM | POA: Diagnosis not present

## 2019-10-22 DIAGNOSIS — E872 Acidosis: Secondary | ICD-10-CM | POA: Diagnosis present

## 2019-10-22 DIAGNOSIS — R41841 Cognitive communication deficit: Secondary | ICD-10-CM | POA: Diagnosis not present

## 2019-10-22 DIAGNOSIS — K921 Melena: Secondary | ICD-10-CM | POA: Diagnosis not present

## 2019-10-22 DIAGNOSIS — G40209 Localization-related (focal) (partial) symptomatic epilepsy and epileptic syndromes with complex partial seizures, not intractable, without status epilepticus: Secondary | ICD-10-CM | POA: Diagnosis present

## 2019-10-22 DIAGNOSIS — J449 Chronic obstructive pulmonary disease, unspecified: Secondary | ICD-10-CM | POA: Diagnosis not present

## 2019-10-22 DIAGNOSIS — F0150 Vascular dementia without behavioral disturbance: Secondary | ICD-10-CM | POA: Diagnosis not present

## 2019-10-22 DIAGNOSIS — R6520 Severe sepsis without septic shock: Secondary | ICD-10-CM

## 2019-10-22 DIAGNOSIS — R918 Other nonspecific abnormal finding of lung field: Secondary | ICD-10-CM | POA: Diagnosis not present

## 2019-10-22 DIAGNOSIS — R1084 Generalized abdominal pain: Secondary | ICD-10-CM | POA: Diagnosis not present

## 2019-10-22 DIAGNOSIS — E876 Hypokalemia: Secondary | ICD-10-CM

## 2019-10-22 DIAGNOSIS — I69391 Dysphagia following cerebral infarction: Secondary | ICD-10-CM | POA: Diagnosis not present

## 2019-10-22 DIAGNOSIS — J189 Pneumonia, unspecified organism: Secondary | ICD-10-CM | POA: Diagnosis not present

## 2019-10-22 DIAGNOSIS — I480 Paroxysmal atrial fibrillation: Secondary | ICD-10-CM | POA: Diagnosis present

## 2019-10-22 DIAGNOSIS — R55 Syncope and collapse: Secondary | ICD-10-CM

## 2019-10-22 DIAGNOSIS — R791 Abnormal coagulation profile: Secondary | ICD-10-CM | POA: Diagnosis not present

## 2019-10-22 DIAGNOSIS — K567 Ileus, unspecified: Secondary | ICD-10-CM | POA: Diagnosis not present

## 2019-10-22 DIAGNOSIS — Z681 Body mass index (BMI) 19 or less, adult: Secondary | ICD-10-CM | POA: Diagnosis not present

## 2019-10-22 DIAGNOSIS — R778 Other specified abnormalities of plasma proteins: Secondary | ICD-10-CM | POA: Diagnosis not present

## 2019-10-22 DIAGNOSIS — I639 Cerebral infarction, unspecified: Secondary | ICD-10-CM | POA: Diagnosis not present

## 2019-10-22 DIAGNOSIS — G40909 Epilepsy, unspecified, not intractable, without status epilepticus: Secondary | ICD-10-CM | POA: Diagnosis not present

## 2019-10-22 DIAGNOSIS — R64 Cachexia: Secondary | ICD-10-CM | POA: Diagnosis present

## 2019-10-22 DIAGNOSIS — M2550 Pain in unspecified joint: Secondary | ICD-10-CM | POA: Diagnosis not present

## 2019-10-22 DIAGNOSIS — D649 Anemia, unspecified: Secondary | ICD-10-CM | POA: Diagnosis not present

## 2019-10-22 DIAGNOSIS — R278 Other lack of coordination: Secondary | ICD-10-CM | POA: Diagnosis not present

## 2019-10-22 DIAGNOSIS — Z7401 Bed confinement status: Secondary | ICD-10-CM | POA: Diagnosis not present

## 2019-10-22 DIAGNOSIS — Z8673 Personal history of transient ischemic attack (TIA), and cerebral infarction without residual deficits: Secondary | ICD-10-CM | POA: Diagnosis not present

## 2019-10-22 DIAGNOSIS — Y95 Nosocomial condition: Secondary | ICD-10-CM | POA: Diagnosis present

## 2019-10-22 DIAGNOSIS — Z20822 Contact with and (suspected) exposure to covid-19: Secondary | ICD-10-CM | POA: Diagnosis present

## 2019-10-22 DIAGNOSIS — E119 Type 2 diabetes mellitus without complications: Secondary | ICD-10-CM | POA: Diagnosis not present

## 2019-10-22 DIAGNOSIS — A419 Sepsis, unspecified organism: Secondary | ICD-10-CM | POA: Diagnosis not present

## 2019-10-22 DIAGNOSIS — Z87891 Personal history of nicotine dependence: Secondary | ICD-10-CM | POA: Diagnosis not present

## 2019-10-22 DIAGNOSIS — I248 Other forms of acute ischemic heart disease: Secondary | ICD-10-CM | POA: Diagnosis not present

## 2019-10-22 DIAGNOSIS — A4150 Gram-negative sepsis, unspecified: Secondary | ICD-10-CM | POA: Diagnosis present

## 2019-10-22 DIAGNOSIS — E43 Unspecified severe protein-calorie malnutrition: Secondary | ICD-10-CM | POA: Diagnosis present

## 2019-10-22 DIAGNOSIS — R569 Unspecified convulsions: Secondary | ICD-10-CM | POA: Diagnosis not present

## 2019-10-22 DIAGNOSIS — R1312 Dysphagia, oropharyngeal phase: Secondary | ICD-10-CM | POA: Diagnosis not present

## 2019-10-22 DIAGNOSIS — J690 Pneumonitis due to inhalation of food and vomit: Secondary | ICD-10-CM | POA: Diagnosis present

## 2019-10-22 DIAGNOSIS — S43081A Other subluxation of right shoulder joint, initial encounter: Secondary | ICD-10-CM | POA: Diagnosis not present

## 2019-10-22 LAB — CBG MONITORING, ED
Glucose-Capillary: 102 mg/dL — ABNORMAL HIGH (ref 70–99)
Glucose-Capillary: 137 mg/dL — ABNORMAL HIGH (ref 70–99)
Glucose-Capillary: 149 mg/dL — ABNORMAL HIGH (ref 70–99)
Glucose-Capillary: 154 mg/dL — ABNORMAL HIGH (ref 70–99)
Glucose-Capillary: 91 mg/dL (ref 70–99)

## 2019-10-22 LAB — COMPREHENSIVE METABOLIC PANEL
ALT: 22 U/L (ref 0–44)
AST: 39 U/L (ref 15–41)
Albumin: 3 g/dL — ABNORMAL LOW (ref 3.5–5.0)
Alkaline Phosphatase: 69 U/L (ref 38–126)
Anion gap: 10 (ref 5–15)
BUN: 14 mg/dL (ref 8–23)
CO2: 25 mmol/L (ref 22–32)
Calcium: 8.3 mg/dL — ABNORMAL LOW (ref 8.9–10.3)
Chloride: 101 mmol/L (ref 98–111)
Creatinine, Ser: 0.7 mg/dL (ref 0.61–1.24)
GFR calc Af Amer: >60 mL/min (ref >60)
GFR calc non Af Amer: >60 mL/min (ref >60)
Glucose, Bld: 140 mg/dL — ABNORMAL HIGH (ref 70–99)
Potassium: 3.2 mmol/L — ABNORMAL LOW (ref 3.5–5.1)
Sodium: 136 mmol/L (ref 135–145)
Total Bilirubin: 0.9 mg/dL (ref 0.3–1.2)
Total Protein: 6.2 g/dL — ABNORMAL LOW (ref 6.5–8.1)

## 2019-10-22 LAB — PROTIME-INR
INR: 2.8 — ABNORMAL HIGH (ref 0.8–1.2)
Prothrombin Time: 28.3 seconds — ABNORMAL HIGH (ref 11.4–15.2)

## 2019-10-22 LAB — EXPECTORATED SPUTUM ASSESSMENT W GRAM STAIN, RFLX TO RESP C

## 2019-10-22 LAB — RESPIRATORY PANEL BY PCR

## 2019-10-22 LAB — PHENYTOIN LEVEL, TOTAL: Phenytoin Lvl: 12.3 ug/mL (ref 10.0–20.0)

## 2019-10-22 LAB — CBC WITH DIFFERENTIAL/PLATELET
Abs Immature Granulocytes: 0.12 10*3/uL — ABNORMAL HIGH (ref 0.00–0.07)
Basophils Absolute: 0.1 10*3/uL (ref 0.0–0.1)
Basophils Relative: 0 %
Eosinophils Absolute: 0 10*3/uL (ref 0.0–0.5)
Eosinophils Relative: 0 %
HCT: 27.8 % — ABNORMAL LOW (ref 39.0–52.0)
Hemoglobin: 8.7 g/dL — ABNORMAL LOW (ref 13.0–17.0)
Immature Granulocytes: 1 %
Lymphocytes Relative: 2 %
Lymphs Abs: 0.5 10*3/uL — ABNORMAL LOW (ref 0.7–4.0)
MCH: 28.1 pg (ref 26.0–34.0)
MCHC: 31.3 g/dL (ref 30.0–36.0)
MCV: 89.7 fL (ref 80.0–100.0)
Monocytes Absolute: 1.1 10*3/uL — ABNORMAL HIGH (ref 0.1–1.0)
Monocytes Relative: 4 %
Neutro Abs: 23.3 10*3/uL — ABNORMAL HIGH (ref 1.7–7.7)
Neutrophils Relative %: 93 %
Platelets: 226 10*3/uL (ref 150–400)
RBC: 3.1 MIL/uL — ABNORMAL LOW (ref 4.22–5.81)
RDW: 20.2 % — ABNORMAL HIGH (ref 11.5–15.5)
WBC Morphology: INCREASED
WBC: 25.1 10*3/uL — ABNORMAL HIGH (ref 4.0–10.5)
nRBC: 0 % (ref 0.0–0.2)

## 2019-10-22 LAB — MRSA PCR SCREENING: MRSA by PCR: NEGATIVE

## 2019-10-22 LAB — ETHANOL: Alcohol, Ethyl (B): <10 mg/dL (ref <10)

## 2019-10-22 LAB — PHOSPHORUS
Phosphorus: 2.1 mg/dL — ABNORMAL LOW (ref 2.5–4.6)
Phosphorus: 2.2 mg/dL — ABNORMAL LOW (ref 2.5–4.6)

## 2019-10-22 LAB — PROCALCITONIN
Procalcitonin: 1.97 ng/mL
Procalcitonin: 1.75 ng/mL

## 2019-10-22 LAB — URINE CULTURE: Culture: <10000 — AB

## 2019-10-22 LAB — ECHOCARDIOGRAM COMPLETE
Height: 72 in
S' Lateral: 2 cm
Weight: 1940.05 oz

## 2019-10-22 LAB — HEMOGLOBIN A1C
Hgb A1c MFr Bld: 4.7 % — ABNORMAL LOW (ref 4.8–5.6)
Mean Plasma Glucose: 88.19 mg/dL

## 2019-10-22 LAB — LACTIC ACID, PLASMA: Lactic Acid, Venous: 2.1 mmol/L (ref 0.5–1.9)

## 2019-10-22 LAB — HIV ANTIBODY (ROUTINE TESTING W REFLEX): HIV Screen 4th Generation wRfx: NONREACTIVE

## 2019-10-22 LAB — GLUCOSE, CAPILLARY: Glucose-Capillary: 128 mg/dL — ABNORMAL HIGH (ref 70–99)

## 2019-10-22 LAB — MAGNESIUM
Magnesium: 1.5 mg/dL — ABNORMAL LOW (ref 1.7–2.4)
Magnesium: 1.8 mg/dL (ref 1.7–2.4)

## 2019-10-22 LAB — TSH: TSH: 0.781 u[IU]/mL (ref 0.350–4.500)

## 2019-10-22 MED ORDER — AMIODARONE LOAD VIA INFUSION
150.0000 mg | Freq: Once | INTRAVENOUS | Status: AC
  Administered 2019-10-22: 150 mg via INTRAVENOUS
  Filled 2019-10-22: qty 83.34

## 2019-10-22 MED ORDER — MAGNESIUM SULFATE 2 GM/50ML IV SOLN
2.0000 g | Freq: Once | INTRAVENOUS | Status: AC
  Administered 2019-10-22: 2 g via INTRAVENOUS
  Filled 2019-10-22: qty 50

## 2019-10-22 MED ORDER — DILTIAZEM HCL 25 MG/5ML IV SOLN
10.0000 mg | Freq: Once | INTRAVENOUS | Status: AC
  Administered 2019-10-22: 10 mg via INTRAVENOUS
  Filled 2019-10-22: qty 5

## 2019-10-22 MED ORDER — SODIUM CHLORIDE 0.9 % IV BOLUS
500.0000 mL | Freq: Once | INTRAVENOUS | Status: AC
  Administered 2019-10-22: 500 mL via INTRAVENOUS

## 2019-10-22 MED ORDER — SODIUM CHLORIDE 0.9 % IV SOLN
3.0000 g | Freq: Three times a day (TID) | INTRAVENOUS | Status: DC
  Administered 2019-10-22 (×2): 3 g via INTRAVENOUS
  Filled 2019-10-22 (×6): qty 8

## 2019-10-22 MED ORDER — POTASSIUM PHOSPHATES 15 MMOLE/5ML IV SOLN
10.0000 mmol | Freq: Once | INTRAVENOUS | Status: AC
  Administered 2019-10-22: 10 mmol via INTRAVENOUS
  Filled 2019-10-22: qty 3.33

## 2019-10-22 MED ORDER — AMIODARONE HCL IN DEXTROSE 360-4.14 MG/200ML-% IV SOLN
30.0000 mg/h | INTRAVENOUS | Status: DC
  Administered 2019-10-22 – 2019-10-24 (×4): 30 mg/h via INTRAVENOUS
  Filled 2019-10-22 (×5): qty 200

## 2019-10-22 MED ORDER — SODIUM CHLORIDE 0.9 % IV SOLN
3.0000 g | Freq: Three times a day (TID) | INTRAVENOUS | Status: AC
  Administered 2019-10-22 – 2019-10-27 (×16): 3 g via INTRAVENOUS
  Filled 2019-10-22 (×2): qty 8
  Filled 2019-10-22 (×2): qty 3
  Filled 2019-10-22 (×3): qty 8
  Filled 2019-10-22: qty 3
  Filled 2019-10-22: qty 8
  Filled 2019-10-22 (×2): qty 3
  Filled 2019-10-22 (×2): qty 8
  Filled 2019-10-22: qty 3
  Filled 2019-10-22 (×3): qty 8

## 2019-10-22 MED ORDER — AMIODARONE HCL IN DEXTROSE 360-4.14 MG/200ML-% IV SOLN
60.0000 mg/h | INTRAVENOUS | Status: DC
  Administered 2019-10-22: 60 mg/h via INTRAVENOUS
  Filled 2019-10-22: qty 200

## 2019-10-22 NOTE — Progress Notes (Addendum)
Inpatient Diabetes Program Recommendations  AACE/ADA: New Consensus Statement on Inpatient Glycemic Control (2015)  Target Ranges:  Prepandial:   less than 140 mg/dL      Peak postprandial:   less than 180 mg/dL (1-2 hours)      Critically ill patients:  140 - 180 mg/dL   Results for [name], Brent "KEN" (MRN [id number]) as of 10/22/2019 09:32  Ref. Range 10/21/2019 21:09 10/22/2019 00:23 10/22/2019 05:24 10/22/2019 08:08  Glucose-Capillary Latest Ref Range: 70 - 99 mg/dL 152 (H)  1 unit NOVOLOG  149 (H)  1 unit NOVOLOG  137 (H)  1 unit NOVOLOG  154 (H)  2 units NOVOLOG    Results for [name], Brent "KEN" (MRN [id number]) as of 10/22/2019 09:32  Ref. Range 09/10/2019 02:57 10/21/2019 23:39  Hemoglobin A1C Latest Ref Range: 4.8 - 5.6 % 5.3 4.7 (L)    Admit with: Pneumonia/ Weakness/ Possible Ileus vs Obstruction   History: Dementia, Seizure d/o, CVA, Pancreatitis  No Prior History of Diabetes  Current Orders: Novolog Sensitive Correction Scale/ SSI (0-9 units) Q4 hours    MD- Received referral for new diabetes for this patient, however, A1c results from yesterday show 4.7%.  May not be completely accurate given Hemoglobin level was low on admission, however, CBGs have been stable since arrival to hospital.  Unsure if this is a new diagnosis of diabetes??  MD, can you please clarify?  If so, please let the diabetes team know so we can visit with pt/pt's family to discuss new diagnosis.    --Will follow patient during hospitalization--  Wyn Quaker RN, MSN, CDE Diabetes Coordinator Inpatient Glycemic Control Team Team Pager: [phone] (8a-5p)

## 2019-10-22 NOTE — Patient Outreach (Addendum)
[name]) Care Management  10/22/2019  [name] 03-14-1941 [id number]   Member scheduled for outreach call today, however noted he is currently hospitalized.  Hospital liaisons notified of admission.  Will follow up with member's daughter pending discharge.  [name], [place], MSN [facility] [phone]

## 2019-10-22 NOTE — Evaluation (Signed)
Physical Therapy Evaluation Patient Details Name: [name] MRN: [id number] DOB: 1940-05-09 Today's Date: 10/22/2019   History of Present Illness  Pt is an 79 y/o male admitted secondary to generalized weakness and fever. Thought to be from CAP. Pt also with A fib and possible bowel obstruction vs. ileus. PMH includes HTN, seizures, dementia, a fib, and CVA.   Clinical Impression  Pt admitted secondary to problem above with deficits below. Pt limited this session secondary to elevated HR. HR elevating to low 130s during rolling tasks for clean up as pt had urinated in bed. Pt also complaining of R scapular pain. Performed manual therapy to parascapular muscles to help with pain management. If pt progresses well, may be able to d/c home with HHPT services. However, if pt does not progress well, will likely need SNF level therapies. Will continue to follow acutely to maximize functional mobility independence and safety.     Follow Up Recommendations Other (comment);Supervision/Assistance - 24 hour (HHPT vs SNF pending progression )    Equipment Recommendations  None recommended by PT    Recommendations for Other Services       Precautions / Restrictions Precautions Precautions: Fall;Other (comment) Precaution Comments: watch HR  Restrictions Weight Bearing Restrictions: No      Mobility  Bed Mobility Overal bed mobility: Needs Assistance Bed Mobility: Rolling Rolling: Min assist;Max assist         General bed mobility comments: Max A for assist with rolling to R secondary to pain. Min A for assist with rolling to L for clean up as pt had soiled the bed. Pt requiring use of bed rails. Pt HR elevated to 130s with rolling so further mobility deferred.   Transfers                    Ambulation/Gait                Stairs            Wheelchair Mobility    Modified Rankin (Stroke Patients Only)       Balance                                              Pertinent Vitals/Pain Pain Assessment: No/denies pain    Home Living Family/patient expects to be discharged to:: Private residence Living Arrangements: Alone Available Help at Discharge: Family;Available PRN/intermittently Type of Home: House Home Access: Stairs to enter Entrance Stairs-Rails: None Entrance Stairs-Number of Steps: 1 Home Layout: Able to live on main level with bedroom/bathroom Home Equipment: Cane - single point;Walker - 2 wheels      Prior Function Level of Independence: Independent with assistive device(s)         Comments: reports typically uses RW, performs own ADLs     Hand Dominance        Extremity/Trunk Assessment   Upper Extremity Assessment Upper Extremity Assessment: Generalized weakness;RUE deficits/detail RUE Deficits / Details: Pt with limited R shoulder movement. Reporting pain in scapula.     Lower Extremity Assessment Lower Extremity Assessment: Generalized weakness    Cervical / Trunk Assessment Cervical / Trunk Assessment: Kyphotic  Communication   Communication: HOH  Cognition Arousal/Alertness: Awake/alert Behavior During Therapy: WFL for tasks assessed/performed Overall Cognitive Status: History of cognitive impairments - at baseline  General Comments: Dementia at baseline. Disoriented to time       General Comments      Exercises Other Exercises Other Exercises: Performed manual therapy to parascapular muscles on R to help with pain management.    Assessment/Plan    PT Assessment Patient needs continued PT services  PT Problem List Decreased strength;Decreased balance;Decreased range of motion;Decreased activity tolerance;Decreased mobility;Cardiopulmonary status limiting activity;Decreased knowledge of precautions;Decreased cognition;Decreased knowledge of use of DME;Decreased safety awareness       PT Treatment Interventions Gait  training;DME instruction;Functional mobility training;Therapeutic exercise;Therapeutic activities;Balance training;Cognitive remediation;Patient/family education    PT Goals (Current goals can be found in the Care Plan section)  Acute Rehab PT Goals Patient Stated Goal: for shoulder to stop hurting PT Goal Formulation: With patient Time For Goal Achievement: 11/05/19 Potential to Achieve Goals: Fair    Frequency Min 3X/week   Barriers to discharge        Co-evaluation               AM-PAC PT "6 Clicks" Mobility  Outcome Measure Help needed turning from your back to your side while in a flat bed without using bedrails?: A Lot Help needed moving from lying on your back to sitting on the side of a flat bed without using bedrails?: A Lot Help needed moving to and from a bed to a chair (including a wheelchair)?: A Lot Help needed standing up from a chair using your arms (e.g., wheelchair or bedside chair)?: A Lot Help needed to walk in hospital room?: A Lot Help needed climbing 3-5 steps with a railing? : Total 6 Click Score: 11    End of Session   Activity Tolerance: Treatment limited secondary to medical complications (Comment) (elevated HR ) Patient left: in bed;with call bell/phone within reach Nurse Communication: Mobility status PT Visit Diagnosis: Unsteadiness on feet (R26.81);Muscle weakness (generalized) (M62.81);Difficulty in walking, not elsewhere classified (R26.2)    Time: 0254-2706 PT Time Calculation (min) (ACUTE ONLY): 21 min   Charges:   PT Evaluation $PT Eval Moderate Complexity: 1 Mod          [name], PT, DPT  Acute Rehabilitation Services  Pager: [phone] Office: [phone]   [name] 10/22/2019, 6:00 PM

## 2019-10-22 NOTE — Consult Note (Signed)
Referring Provider: Dr. Toy Baker Primary Care Physician:  [name], MD Primary Gastroenterologist:  Dr. Therisa Doyne [name] GI)  Reason for Consultation:  Abnormal CT: obstruction vs. ileus  HPI: [name] is a 79 y.o. male with history ofdementia,  A fib (on Coumadin), seizures, CVA, and GI bleeding presenting for consultation of abnormal CT imaging.  Patient presented to the ED today with weakness, fatigue, cough and low-grade fever.  CT 10/21/19: Moderately dilated gas and fluid-filled small bowel and colon with decompressed terminal ileum. Changes likely represent obstruction although ileus may also be present. No wall thickening or mass appreciated.  Patient denies any dysphagia, GERD, abdominal pain, nausea, vomiting, hematemesis, melena, hematochezia, diarrhea.  Denies any shortness of breath or chest pain.  Patient able to report name and location.  States he is in the hospital because he does not feel well.  Unable to report year or president (reported year is 2002 or 2003 and president as [name]).  Called and spoke with patient's daughter, [name], he states he has been feeling unwell for the past few days.  She states he has had some looser stools but he has not had any GI bleeding recently.  Patient admitted for GI bleed at the end of June in the setting of Coumadin.  EGD 09/11/19 revealed 3 angiodysplastic lesions in the duodenum, treated with argon plasma coagulation (APC).  Past Medical History:  Diagnosis Date  . Diabetes mellitus without complication ([place])   . Hiatal hernia   . HTN (hypertension)    pt denies h/o HTN though it appears he was started on spironolactone during his hospitalization 3/13  . Pancreatitis   . Seizures ([place])   . Stroke ([place])   . Typical atrial flutter ([place]) 3/13    Past Surgical History:  Procedure Laterality Date  . ESOPHAGOGASTRODUODENOSCOPY N/A 11/30/2015   Procedure: ESOPHAGOGASTRODUODENOSCOPY (EGD);  Surgeon: Wonda Horner, MD;  Location: [name] ENDOSCOPY;  Service: Endoscopy;  Laterality: N/A;  . ESOPHAGOGASTRODUODENOSCOPY N/A 09/11/2019   Procedure: ESOPHAGOGASTRODUODENOSCOPY (EGD);  Surgeon: Wonda Horner, MD;  Location: [facility] ENDOSCOPY;  Service: Endoscopy;  Laterality: N/A;  . ESOPHAGOGASTRODUODENOSCOPY (EGD) WITH PROPOFOL N/A 07/23/2018   Procedure: ESOPHAGOGASTRODUODENOSCOPY (EGD) WITH PROPOFOL;  Surgeon: Otis Brace, MD;  Location: [facility] ENDOSCOPY;  Service: Gastroenterology;  Laterality: N/A;  . FEMUR IM NAIL Left 12/08/2016  . FEMUR IM NAIL Left 12/07/2016   Procedure: INTRAMEDULLARY (IM) NAIL FEMORAL;  Surgeon: Renette Butters, MD;  Location: [facility];  Service: Orthopedics;  Laterality: Left;  . GIVENS CAPSULE STUDY N/A 07/24/2018   Procedure: GIVENS CAPSULE STUDY;  Surgeon: Otis Brace, MD;  Location: [facility];  Service: Gastroenterology;  Laterality: N/A;  . HIP SURGERY Left 01/2017  . HOT HEMOSTASIS N/A 07/23/2018   Procedure: HOT HEMOSTASIS (ARGON PLASMA COAGULATION/BICAP);  Surgeon: Otis Brace, MD;  Location: [facility] ENDOSCOPY;  Service: Gastroenterology;  Laterality: N/A;  . HOT HEMOSTASIS N/A 09/11/2019   Procedure: HOT HEMOSTASIS (ARGON PLASMA COAGULATION/BICAP);  Surgeon: Wonda Horner, MD;  Location[name] ENDOSCOPY;  Service: Endoscopy;  Laterality: N/A;  . no surgical history  06/2011    Prior to Admission medications   Medication Sig Start Date End Date Taking? Authorizing Provider  amiodarone (PACERONE) 200 MG tablet Take 1 tablet (200 mg total) by mouth daily. Patient taking differently: Take 200 mg by mouth at bedtime.  08/22/19  Yes Fenton, Clint R, PA  feeding supplement, ENSURE ENLIVE, (ENSURE ENLIVE) LIQD Take 237 mLs by mouth 3 (three) times daily between meals.  Patient taking differently: Take 237 mLs by mouth 3 (three) times daily as needed (for supplementation).  07/26/19  Yes Hosie Poisson, MD  ferrous sulfate 325 (65 FE) MG tablet Take 1 tablet (325 mg total) by mouth 3  (three) times daily with meals. Patient taking differently: Take 325 mg by mouth daily with breakfast.  12/15/16  Yes Velvet Bathe, MD  folic acid (FOLVITE) 1 MG tablet Take 1 tablet (1 mg total) by mouth daily. 10/25/18  Yes Mercy Riding, MD  levETIRAcetam (KEPPRA) 500 MG tablet TAKE 3 TABLETS BY MOUTH TWICE A DAY Patient taking differently: Take 1,500 mg by mouth in the morning and at bedtime.  10/01/19  Yes Cameron Sprang, MD  Multiple Vitamin (MULTIVITAMIN WITH MINERALS) TABS tablet Take 1 tablet by mouth daily. 07/26/19  Yes Hosie Poisson, MD  pantoprazole (PROTONIX) 40 MG tablet Take 1 tablet (40 mg total) by mouth daily at 6 (six) AM. 09/13/19  Yes Eugenie Filler, MD  phenytoin (DILANTIN) 100 MG ER capsule Take 2 capsules (200 mg total) by mouth at bedtime. 10/01/19  Yes Cameron Sprang, MD  polyethylene glycol (MIRALAX / GLYCOLAX) 17 g packet Take 17 g by mouth daily as needed for mild constipation. Patient taking differently: Take 17 g by mouth daily as needed for mild constipation (MIX INTO WATER AS DIRECTED AND DRINK).  10/24/18  Yes Mercy Riding, MD  warfarin (COUMADIN) 2.5 MG tablet TAKE AS DIRECTED BY COUMADIN CLINIC Patient taking differently: Take 2.5-5 mg by mouth See admin instructions. Take 5 mg by mouth at 6 PM in the evening on Sun/Tues/Wed/Thurs/Sat and 2.5 mg on Mon/Fri 10/15/19  Yes Allred, Jeneen Rinks, MD    Scheduled Meds: . amiodarone  200 mg Oral QHS  . docusate sodium  100 mg Oral BID  . folic acid  1 mg Oral Daily  . guaiFENesin  600 mg Oral BID  . insulin aspart  0-9 Units Subcutaneous Q4H  . levETIRAcetam  1,500 mg Oral BID  . pantoprazole  40 mg Oral Q0600  . phenytoin  200 mg Oral QHS  . sodium chloride flush  3 mL Intravenous Q12H   Continuous Infusions: . ampicillin-sulbactam (UNASYN) IV Stopped (10/22/19 0237)  . doxycycline (VIBRAMYCIN) IV Stopped (10/22/19 0045)   PRN Meds:.acetaminophen **OR** acetaminophen, albuterol, HYDROcodone-acetaminophen, ondansetron  **OR** ondansetron (ZOFRAN) IV  Allergies as of 10/21/2019  . (No Known Allergies)    Family History  Problem Relation Age of Onset  . Hypertension Other     Social History   Socioeconomic History  . Marital status: Divorced    Spouse name: Not on file  . Number of children: Not on file  . Years of education: Not on file  . Highest education level: Not on file  Occupational History  . Not on file  Tobacco Use  . Smoking status: Former Smoker    Quit date: 07/15/1966    Years since quitting: 53.3  . Smokeless tobacco: Never Used  Vaping Use  . Vaping Use: Never used  Substance and Sexual Activity  . Alcohol use: No    Comment: Quit greater than 5 years ago  . Drug use: No  . Sexual activity: Not on file  Other Topics Concern  . Not on file  Social History Narrative   Lives alone.  Retired from [employer]   Social Determinants of Radio broadcast assistant Strain:   . Difficulty of Paying Living Expenses:   Food Insecurity: No Food Insecurity  .  Worried About Charity fundraiser in the Last Year: Never true  . Ran Out of Food in the Last Year: Never true  Transportation Needs: No Transportation Needs  . Lack of Transportation (Medical): No  . Lack of Transportation (Non-Medical): No  Physical Activity:   . Days of Exercise per Week:   . Minutes of Exercise per Session:   Stress:   . Feeling of Stress :   Social Connections:   . Frequency of Communication with Friends and Family:   . Frequency of Social Gatherings with Friends and Family:   . Attends Religious Services:   . Active Member of Clubs or Organizations:   . Attends Archivist Meetings:   Marland Kitchen Marital Status:   Intimate Partner Violence:   . Fear of Current or Ex-Partner:   . Emotionally Abused:   Marland Kitchen Physically Abused:   . Sexually Abused:     Review of Systems: Review of Systems  Constitutional: Positive for fever and malaise/fatigue. Negative for chills and weight loss.  HENT:  Negative for sore throat.   Eyes: Negative for pain and redness.  Respiratory: Positive for cough. Negative for shortness of breath and stridor.   Cardiovascular: Negative for chest pain and palpitations.  Gastrointestinal: Positive for diarrhea. Negative for abdominal pain, blood in stool, constipation, heartburn, melena, nausea and vomiting.  Genitourinary: Negative for flank pain and hematuria.  Musculoskeletal: Negative for falls and joint pain.  Skin: Negative for itching and rash.  Neurological: Positive for seizures. Negative for headaches.  Endo/Heme/Allergies: Negative for polydipsia. Bruises/bleeds easily.  Psychiatric/Behavioral: Negative for substance abuse. The patient is not nervous/anxious.      Physical Exam: Vital signs: Vitals:   10/22/19 0700 10/22/19 0745  BP: 107/76 (!) 101/60  Pulse:  (!) 124  Resp: (!) 28 16  Temp:    SpO2:  100%      Physical Exam Constitutional:      General: He is not in acute distress.    Appearance: He is underweight.  HENT:     Head: Normocephalic and atraumatic.     Nose: Nose normal.     Mouth/Throat:     Mouth: Mucous membranes are moist.     Pharynx: Oropharynx is clear.  Eyes:     General: No scleral icterus.    Extraocular Movements: Extraocular movements intact.     Conjunctiva/sclera: Conjunctivae normal.  Cardiovascular:     Rate and Rhythm: Normal rate. Rhythm irregular.     Pulses: Normal pulses.  Pulmonary:     Effort: Pulmonary effort is normal. Tachypnea present. No respiratory distress.  Abdominal:     General: Bowel sounds are normal. There is distension (mild).     Palpations: Abdomen is soft. There is no mass.     Tenderness: There is no abdominal tenderness. There is no guarding or rebound.     Hernia: No hernia is present.  Musculoskeletal:        General: No tenderness.     Cervical back: Normal range of motion and neck supple.     Right lower leg: No edema.     Left lower leg: No edema.  Skin:     General: Skin is warm and dry.  Neurological:     General: No focal deficit present.     Mental Status: He is lethargic and disoriented.  Psychiatric:        Mood and Affect: Mood normal.        Behavior: Behavior normal.  GI:  Lab Results: Recent Labs    10/19/19 1900 10/21/19 1607 10/22/19 0415  WBC 14.3* 18.1* 25.1*  HGB 9.6* 9.3* 8.7*  HCT 31.7* 30.5* 27.8*  PLT 250 316 226   BMET Recent Labs    10/19/19 1900 10/21/19 1607 10/22/19 0415  NA 138 135 136  K 4.3 3.4* 3.2*  CL 102 99 101  CO2 28 24 25   GLUCOSE 149* 239* 140*  BUN 13 14 14   CREATININE 0.82 0.86 0.70  CALCIUM 8.7* 8.9 8.3*   LFT Recent Labs    10/21/19 1742 10/21/19 1742 10/22/19 0415  PROT 7.4   < > 6.2*  ALBUMIN 3.6   < > 3.0*  AST 46*   < > 39  ALT 23   < > 22  ALKPHOS 77   < > 69  BILITOT 0.7   < > 0.9  BILIDIR 0.2  --   --   IBILI 0.5  --   --    < > = values in this interval not displayed.   PT/INR Recent Labs    10/21/19 1742 10/22/19 0415  LABPROT 25.3* 28.3*  INR 2.4* 2.8*     Studies/Results: DG Chest 2 View  Result Date: 10/21/2019 CLINICAL DATA:  Cough, chest pain, weakness EXAM: CHEST - 2 VIEW COMPARISON:  Chest x-rays dated 10/19/2019 and 09/09/2019. FINDINGS: Heart size and mediastinal contours are stable. Lungs are hyperexpanded. Retrocardiac opacity, pneumonia versus atelectasis. No pleural effusion or pneumothorax is seen. No acute appearing osseous abnormality. IMPRESSION: 1. Retrocardiac opacity, pneumonia versus atelectasis. 2. Hyperexpanded lungs suggesting COPD. Electronically Signed   By: Franki Cabot [name]   On: 10/21/2019 16:41   DG Abd 1 View  Result Date: 10/21/2019 CLINICAL DATA:  Abdominal distension EXAM: ABDOMEN - 1 VIEW COMPARISON:  None. FINDINGS: Diffusely mildly dilated air-filled loops of bowel are seen throughout the abdomen. There is question of a double wall sign seen within the mid abdomen which could represent a small amount of  pneumoperitoneum. Degenerative changes in the lumbar spine and bilateral hips. IMPRESSION: Mildly dilated air-filled loops of bowel throughout the abdomen with question of double wall sign which could represent pneumoperitoneum. If further evaluation is required would recommend lateral decubitus film or cross-sectional imaging. Electronically Signed   By: Prudencio Pair [name]   On: 10/21/2019 22:14   CT ABDOMEN PELVIS W CONTRAST  Result Date: 10/21/2019 CLINICAL DATA:  Free air seen on x-ray. Peritonitis or perforation suspected. History of pancreatitis and hiatal hernia. EXAM: CT ABDOMEN AND PELVIS WITH CONTRAST TECHNIQUE: Multidetector CT imaging of the abdomen and pelvis was performed using the standard protocol following bolus administration of intravenous contrast. CONTRAST:  129mL OMNIPAQUE IOHEXOL 300 MG/ML  SOLN COMPARISON:  Abdomen 10/21/2019.  CT abdomen and pelvis 07/21/2019 FINDINGS: Lower chest: Airspace consolidation in both lung bases. Small pericardial effusion. Cardiac enlargement. Hepatobiliary: Scattered calcifications throughout the liver, likely granulomas. No focal lesions. Portal veins are patent. Gallbladder is decompressed. No bile duct dilatation. Pancreas: Atrophic.  No mass or ductal dilatation appreciated. Spleen: Multiple calcifications, likely granulomas. Adrenals/Urinary Tract: No adrenal gland nodules. Kidneys are symmetrical. No renal mass lesion or hydronephrosis. No hydroureter. Bladder is normal. Stomach/Bowel: Moderately dilated gas and fluid-filled small bowel. Portions of the colon are also dilated. No wall thickening or mass appreciated. Terminal ileum is decompressed. Changes likely represent obstruction although ileus may also be present. No free air. Vascular/Lymphatic: Calcific aortic atherosclerosis. No aortic aneurysm. No significant lymphadenopathy. Reproductive: Prostate is unremarkable. Other:  No abdominal wall hernia or abnormality. No abdominopelvic ascites.  Musculoskeletal: Internal fixation of the left hip. Heterotopic bone formation. Degenerative changes in the spine and hips. Ankylosis of the thoracolumbar spine. IMPRESSION: 1. Moderately dilated gas and fluid-filled small bowel and colon with decompressed terminal ileum. Changes likely represent obstruction although ileus may also be present. No wall thickening or mass appreciated. 2. Airspace consolidation in both lung bases may represent pneumonia or aspiration. 3. Cardiac enlargement with small pericardial effusion. 4. Aortic atherosclerosis. 5. Calcified granulomas in the liver and spleen. 6. Internal fixation of the left hip. Heterotopic bone formation. Aortic Atherosclerosis (ICD10-I70.0). Electronically Signed   By: Lucienne Capers [name]   On: 10/21/2019 23:42    Impression: Abnormal CT: obstruction vs. Ileus Currently, no nausea/vomiting.  Mild abdominal distention. Daughter reports recent looser stools.  Diarrhea, C. Diff pending  Pneumonia: fever, cough.  A fib, on Coumadin  Plan: NPO for now.  Supportive care with IVF.  Monitor clinical status.  Maintain Mg >2 and K+ >4  [facility] will follow.   LOS: 0 days   Salley Slaughter  PA-C 10/22/2019, 8:33 AM  Contact #  [phone]

## 2019-10-22 NOTE — Progress Notes (Addendum)
PROGRESS NOTE    Christie Copley Maciolek  GDJ:[id number] DOB: 06-28-1940 DOA: 10/21/2019 PCP: [name], MD   Brief Narrative:  [name] is a 79 y.o. male with medical history significant of A.fib  On Coumadin, Seizure DO, HTN , GERD, CVA, history of pancreatitis, history of hypertension. Presented with generalized fatigue, increased cough and today low grade fever. Was seen in ER on 7/30 for syncopal event.  Patient's family called 911 he was evaluated in emergency department and discharged to home. Daughter has voiced her concerns regarding amiodarone she states ever since he was started he has not been himself.  He has had a number of ER visits since then.  Patient's daughter is concerned that the milligrams was causing weakness and malaise. Recently was admitted for GI Bleed in end of June. Coumadin had to be reversed at that time. Pt was transfused at discharge hg was 8.7, GI rec restarting coumadin. He has been taking his meds. He has been vaccinated against COVID. His covid testing at admission was NEGATIVE   Assessment & Plan:   Active Problems:   Essential hypertension   CIRRHOSIS, ALCOHOLIC, LIVER   Seizure disorder (HCC)   Paroxysmal atrial flutter (HCC)   Chronic anticoagulation   Dementia (AD)   Localization-related symptomatic epilepsy and epileptic syndromes with complex partial seizures, not intractable, without status epilepticus ([place])   Normochromic anemia   CAP (community acquired pneumonia)   Sepsis ([place])   Hypomagnesemia   Hypophosphatemia   Acute hypoxic respiratory failure in the setting of Sepsis due to CAP, POA Rule out aspiration RLL opacification concerning for CAP/Aspiration; meet sepsis criteria given leukocytosis, tachycardia, febrile at admission Continue doxycycline Respiratory panel /blood cultures strep pneumo UA antigen pending  Wean oxygen to maintain sats 88-92% SpO2: 100 % O2 Flow Rate (L/min): 2 L/min  Abd distention/Diarrhea in  the setting of obstruction/ileus As seen on CT/KUB Family reported some diarrhea, pt denies any abd pain Abdomen moderately distended but soft and pain-free on exam Keep NPO at this time and follow BM/Symptoms GI following currently in agreement - will sideline surgery should patient not improve as expected or having worsening symptoms/imaging  Cirrhosis, alcoholic liver, chronic, stable - No longer drinks - Labs WNL Lab Results  Component Value Date   ALT 22 10/22/2019   AST 39 10/22/2019   ALKPHOS 69 10/22/2019   BILITOT 0.9 10/22/2019   Dementia, otherwise unspecified  - Stable - High risk for sundown/delirium  - Avoid BEERS criteria medications as possible  Hyperglycemia, possibly reactive -does not meet criteria for DM - no prior diagnosis of DM , no steroid use - NPO currently - will order SSI Lab Results  Component Value Date   HGBA1C 4.7 (L) 10/21/2019   Essential hypertension, chronic  - Currently NPO - continue only PRN meds at this point - concern for hypotension in the setting of dehydration and sepsis  Localization-related symptomatic epilepsy and epileptic syndromes with complex partial seizures, not intractable, without status epilepticus (HCC) - cekc Dilantin level, Continue homemeds  Normochromic anemia - HG up from baseline cont to monitor  Paroxysmal atrial flutter, rate controlled  CHA2DS2 vas score 5 continue current anticoagulation with Coumadin per pharmacy,   Rate control: patient remains tachycardiac, rehydrate and control fever if tachycardia persists my try Cardizem IV Rhythm control:  Continue amiodarone  Hyperglycemia - check HgA1c sliding scale   DVT prophylaxis: on coumadin  Code Status: FULL CODE   Family Communication: None present  Status is:  Inpatient  Dispo: The patient is from: Home              Anticipated d/c is to: To be determined              Anticipated d/c date is: 72 to 96 hours              Patient currently not  medically stable for discharge given acute hypoxia in the setting of sepsis and pneumonia, possible bowel obstruction currently n.p.o. unable to tolerate p.o. safely.  Consultants:   GI  Procedures:   None planned  Antimicrobials:  Doxycycline, Unasyn initiated 10/21/2019  Subjective: No acute issues or events this morning, patient indicates his abdomen is still nontender, somewhat somnolent early this morning but redirectable and answers questions appropriately. Otherwise denies nausea, vomiting, headache, fevers, chills, chest pain, shortness of breath.  Objective: Vitals:   10/22/19 0245 10/22/19 0539 10/22/19 0700 10/22/19 0745  BP: 118/66  107/76 (!) 101/60  Pulse: (!) 130   (!) 124  Resp: 19  (!) 28 16  Temp: 99.7 F (37.6 C) 98.6 F (37 C)    TempSrc: Oral Oral    SpO2: 100%   100%  Weight:      Height:       No intake or output data in the 24 hours ending 10/22/19 0809 Filed Weights   10/21/19 1603  Weight: 55 kg    Examination: General:  Pleasantly resting in bed, No acute distress. HEENT:  Normocephalic atraumatic.  Sclerae nonicteric, noninjected.  Extraocular movements intact bilaterally. Neck:  Without mass or deformity.  Trachea is midline. Lungs: Left basilar rhonchi, otherwise without overt rales or wheeze Heart:  Regular rate and rhythm.  Without murmurs, rubs, or gallops. Abdomen:  Soft, nontender,  moderately distended.  Without guarding or rebound. Extremities: Without cyanosis, clubbing, edema, or obvious deformity. Vascular:  Dorsalis pedis and posterior tibial pulses palpable bilaterally. Skin:  Warm and dry, no erythema   Data Reviewed: I have personally reviewed following labs and imaging studies  CBC: Recent Labs  Lab 10/19/19 1900 10/21/19 1607 10/22/19 0415  WBC 14.3* 18.1* 25.1*  NEUTROABS 12.3*  --  23.3*  HGB 9.6* 9.3* 8.7*  HCT 31.7* 30.5* 27.8*  MCV 93.0 91.6 89.7  PLT 250 316 010   Basic Metabolic Panel: Recent Labs  Lab  10/19/19 1900 10/21/19 1607 10/21/19 2339 10/22/19 0415  NA 138 135  --  136  K 4.3 3.4*  --  3.2*  CL 102 99  --  101  CO2 28 24  --  25  GLUCOSE 149* 239*  --  140*  BUN 13 14  --  14  CREATININE 0.82 0.86  --  0.70  CALCIUM 8.7* 8.9  --  8.3*  MG  --   --  1.5* 1.8  PHOS  --   --  2.2* 2.1*   GFR: Estimated Creatinine Clearance: 57.3 mL/min (by C-G formula based on SCr of 0.7 mg/dL). Liver Function Tests: Recent Labs  Lab 10/19/19 1900 10/21/19 1742 10/22/19 0415  AST 36 46* 39  ALT 21 23 22   ALKPHOS 93 77 69  BILITOT 0.5 0.7 0.9  PROT 7.3 7.4 6.2*  ALBUMIN 3.8 3.6 3.0*   No results for input(s): LIPASE, AMYLASE in the last 168 hours. No results for input(s): AMMONIA in the last 168 hours. Coagulation Profile: Recent Labs  Lab 10/16/19 1414 10/19/19 1900 10/21/19 1742 10/22/19 0415  INR 4.3* 3.4* 2.4* 2.8*  Cardiac Enzymes: No results for input(s): CKTOTAL, CKMB, CKMBINDEX, TROPONINI in the last 168 hours. BNP (last 3 results) No results for input(s): PROBNP in the last 8760 hours. HbA1C: Recent Labs    10/21/19 2339  HGBA1C 4.7*   CBG: Recent Labs  Lab 10/21/19 2109 10/22/19 0023 10/22/19 0524 10/22/19 0808  GLUCAP 152* 149* 137* 154*   Lipid Profile: No results for input(s): CHOL, HDL, LDLCALC, TRIG, CHOLHDL, LDLDIRECT in the last 72 hours. Thyroid Function Tests: Recent Labs    10/21/19 2341  TSH 0.781   Anemia Panel: No results for input(s): VITAMINB12, FOLATE, FERRITIN, TIBC, IRON, RETICCTPCT in the last 72 hours. Sepsis Labs: Recent Labs  Lab 10/21/19 1607 10/21/19 1743 10/21/19 2339 10/22/19 0415  PROCALCITON  --   --  1.97 1.75  LATICACIDVEN 3.2* 3.1* 2.1*  --     Recent Results (from the past 240 hour(s))  SARS Coronavirus 2 by RT PCR (hospital order, performed in [facility] hospital lab) Nasopharyngeal Nasopharyngeal Swab     Status: None   Collection Time: 10/19/19  8:32 PM   Specimen: Nasopharyngeal Swab  Result  Value Ref Range Status   SARS Coronavirus 2 NEGATIVE NEGATIVE Final    Comment: (NOTE) SARS-CoV-2 target nucleic acids are NOT DETECTED.  The SARS-CoV-2 RNA is generally detectable in upper and lower respiratory specimens during the acute phase of infection. The lowest concentration of SARS-CoV-2 viral copies this assay can detect is 250 copies / mL. A negative result does not preclude SARS-CoV-2 infection and should not be used as the sole basis for treatment or other patient management decisions.  A negative result may occur with improper specimen collection / handling, submission of specimen other than nasopharyngeal swab, presence of viral mutation(s) within the areas targeted by this assay, and inadequate number of viral copies (<250 copies / mL). A negative result must be combined with clinical observations, patient history, and epidemiological information.  Fact Sheet for Patients:   StrictlyIdeas.no  Fact Sheet for Healthcare Providers: BankingDealers.co.za  This test is not yet approved or  cleared by the [place] FDA and has been authorized for detection and/or diagnosis of SARS-CoV-2 by FDA under an Emergency Use Authorization (EUA).  This EUA will remain in effect (meaning this test can be used) for the duration of the COVID-19 declaration under Section 564(b)(1) of the Act, 21 U.S.C. section 360bbb-3(b)(1), unless the authorization is terminated or revoked sooner.  Performed at [facility], [place] [address], [place], [place] [address]   Urine culture     Status: None   Collection Time: 10/19/19  9:50 PM   Specimen: In/Out Cath Urine  Result Value Ref Range Status   Specimen Description IN/OUT CATH URINE  Final   Special Requests NONE  Final   Culture   Final    NO GROWTH Performed at [facility], [place] [address], [place], [place] [address]    Report Status 10/21/2019 FINAL  Final  Respiratory Panel  by PCR     Status: None   Collection Time: 10/21/19  9:27 PM   Specimen: Nasopharyngeal Swab; Respiratory  Result Value Ref Range Status   Adenovirus NOT DETECTED NOT DETECTED Final   Coronavirus 229E NOT DETECTED NOT DETECTED Final    Comment: (NOTE) The Coronavirus on the Respiratory Panel, DOES NOT test for the novel  Coronavirus (2019 nCoV)    Coronavirus HKU1 NOT DETECTED NOT DETECTED Final   Coronavirus NL63 NOT DETECTED NOT DETECTED Final  Coronavirus OC43 NOT DETECTED NOT DETECTED Final   Metapneumovirus NOT DETECTED NOT DETECTED Final   Rhinovirus / Enterovirus NOT DETECTED NOT DETECTED Final   Influenza A NOT DETECTED NOT DETECTED Final   Influenza B NOT DETECTED NOT DETECTED Final   Parainfluenza Virus 1 NOT DETECTED NOT DETECTED Final   Parainfluenza Virus 2 NOT DETECTED NOT DETECTED Final   Parainfluenza Virus 3 NOT DETECTED NOT DETECTED Final   Parainfluenza Virus 4 NOT DETECTED NOT DETECTED Final   Respiratory Syncytial Virus NOT DETECTED NOT DETECTED Final   Bordetella pertussis NOT DETECTED NOT DETECTED Final   Chlamydophila pneumoniae NOT DETECTED NOT DETECTED Final   Mycoplasma pneumoniae NOT DETECTED NOT DETECTED Final    Comment: Performed at [facility], [place] [address], [place], [place] [address]  Culture, sputum-assessment     Status: None   Collection Time: 10/21/19 11:44 PM   Specimen: Sputum  Result Value Ref Range Status   Specimen Description SPUTUM  Final   Special Requests COLLECTED IN THE ER  Final   Sputum evaluation   Final    THIS SPECIMEN IS ACCEPTABLE FOR SPUTUM CULTURE Performed at [facility], [address] [place], [place] [address]    Report Status 10/22/2019 FINAL  Final  Culture, respiratory     Status: None (Preliminary result)   Collection Time: 10/21/19 11:44 PM   Specimen: SPU  Result Value Ref Range Status   Specimen Description SPUTUM  Final   Special Requests COLLECTED IN THE ER Reflexed from [id number]  Final    Gram Stain   Final    RARE WBC PRESENT,BOTH PMN AND MONONUCLEAR FEW GRAM POSITIVE COCCI IN PAIRS FEW GRAM VARIABLE ROD RARE BUDDING YEAST SEEN Performed at [facility], [place] [address], [place], [place] [address]    Culture PENDING  Incomplete   Report Status PENDING  Incomplete  MRSA PCR Screening     Status: None   Collection Time: 10/22/19 12:43 AM   Specimen: Nasal Mucosa; Nasopharyngeal  Result Value Ref Range Status   MRSA by PCR NEGATIVE NEGATIVE Final    Comment:        The GeneXpert MRSA Assay (FDA approved for NASAL specimens only), is one component of a comprehensive MRSA colonization surveillance program. It is not intended to diagnose MRSA infection nor to guide or monitor treatment for MRSA infections. Performed at [facility], [place] [address], [place],  [address]          Radiology Studies: DG Chest 2 View  Result Date: 10/21/2019 CLINICAL DATA:  Cough, chest pain, weakness EXAM: CHEST - 2 VIEW COMPARISON:  Chest x-rays dated 10/19/2019 and 09/09/2019. FINDINGS: Heart size and mediastinal contours are stable. Lungs are hyperexpanded. Retrocardiac opacity, pneumonia versus atelectasis. No pleural effusion or pneumothorax is seen. No acute appearing osseous abnormality. IMPRESSION: 1. Retrocardiac opacity, pneumonia versus atelectasis. 2. Hyperexpanded lungs suggesting COPD. Electronically Signed   By: Franki Cabot [name]   On: 10/21/2019 16:41   DG Abd 1 View  Result Date: 10/21/2019 CLINICAL DATA:  Abdominal distension EXAM: ABDOMEN - 1 VIEW COMPARISON:  None. FINDINGS: Diffusely mildly dilated air-filled loops of bowel are seen throughout the abdomen. There is question of a double wall sign seen within the mid abdomen which could represent a small amount of pneumoperitoneum. Degenerative changes in the lumbar spine and bilateral hips. IMPRESSION: Mildly dilated air-filled loops of bowel throughout the abdomen with question of double wall sign  which  could represent pneumoperitoneum. If further evaluation is required would recommend lateral decubitus film or cross-sectional imaging. Electronically Signed   By: Prudencio Pair [name]   On: 10/21/2019 22:14   CT ABDOMEN PELVIS W CONTRAST  Result Date: 10/21/2019 CLINICAL DATA:  Free air seen on x-ray. Peritonitis or perforation suspected. History of pancreatitis and hiatal hernia. EXAM: CT ABDOMEN AND PELVIS WITH CONTRAST TECHNIQUE: Multidetector CT imaging of the abdomen and pelvis was performed using the standard protocol following bolus administration of intravenous contrast. CONTRAST:  162mL OMNIPAQUE IOHEXOL 300 MG/ML  SOLN COMPARISON:  Abdomen 10/21/2019.  CT abdomen and pelvis 07/21/2019 FINDINGS: Lower chest: Airspace consolidation in both lung bases. Small pericardial effusion. Cardiac enlargement. Hepatobiliary: Scattered calcifications throughout the liver, likely granulomas. No focal lesions. Portal veins are patent. Gallbladder is decompressed. No bile duct dilatation. Pancreas: Atrophic.  No mass or ductal dilatation appreciated. Spleen: Multiple calcifications, likely granulomas. Adrenals/Urinary Tract: No adrenal gland nodules. Kidneys are symmetrical. No renal mass lesion or hydronephrosis. No hydroureter. Bladder is normal. Stomach/Bowel: Moderately dilated gas and fluid-filled small bowel. Portions of the colon are also dilated. No wall thickening or mass appreciated. Terminal ileum is decompressed. Changes likely represent obstruction although ileus may also be present. No free air. Vascular/Lymphatic: Calcific aortic atherosclerosis. No aortic aneurysm. No significant lymphadenopathy. Reproductive: Prostate is unremarkable. Other: No abdominal wall hernia or abnormality. No abdominopelvic ascites. Musculoskeletal: Internal fixation of the left hip. Heterotopic bone formation. Degenerative changes in the spine and hips. Ankylosis of the thoracolumbar spine. IMPRESSION: 1. Moderately dilated  gas and fluid-filled small bowel and colon with decompressed terminal ileum. Changes likely represent obstruction although ileus may also be present. No wall thickening or mass appreciated. 2. Airspace consolidation in both lung bases may represent pneumonia or aspiration. 3. Cardiac enlargement with small pericardial effusion. 4. Aortic atherosclerosis. 5. Calcified granulomas in the liver and spleen. 6. Internal fixation of the left hip. Heterotopic bone formation. Aortic Atherosclerosis (ICD10-I70.0). Electronically Signed   By: Lucienne Capers [name]   On: 10/21/2019 23:42        Scheduled Meds: . amiodarone  200 mg Oral QHS  . docusate sodium  100 mg Oral BID  . folic acid  1 mg Oral Daily  . guaiFENesin  600 mg Oral BID  . insulin aspart  0-9 Units Subcutaneous Q4H  . levETIRAcetam  1,500 mg Oral BID  . pantoprazole  40 mg Oral Q0600  . phenytoin  200 mg Oral QHS  . sodium chloride flush  3 mL Intravenous Q12H   Continuous Infusions: . ampicillin-sulbactam (UNASYN) IV Stopped (10/22/19 0237)  . doxycycline (VIBRAMYCIN) IV Stopped (10/22/19 0045)  . potassium PHOSPHATE IVPB (in mmol) 10 mmol (10/22/19 0219)     LOS: 0 days    Time spent: 5 min  Little Ishikawa, DO [facility]  If 7PM-7AM, please contact night-coverage [email or web address]  10/22/2019, 8:09 AM

## 2019-10-22 NOTE — ED Notes (Signed)
RN attempted to call report to 2W. 2W states they will call back.

## 2019-10-22 NOTE — Progress Notes (Signed)
SLP Cancellation Note  Patient Details Name: [name] MRN: [id number] DOB: [birth year]   Cancelled treatment:       Reason Eval/Treat Not Completed: Patient not medically ready. Pt NPO for potential ileus/obstruction. Will f/u when appropriate   Ameriah Lint, Katherene Ponto 10/22/2019, 1:20 PM

## 2019-10-22 NOTE — Progress Notes (Signed)
We have received notification of a request to see this patient for GI consultation.  Our hospital team will plan to see the patient later today.  Cleotis Nipper, [name] Pager [phone] If no answer or after 5 PM call [phone]

## 2019-10-22 NOTE — Consult Note (Signed)
Cardiology Consultation:   Patient ID: [name] MRN: [id number]; DOB: [birth year]  Admit date: 10/21/2019 Date of Consult: 10/22/2019  Primary Care Provider: [name], [facility] Cardiologist: Fransico Him, MD  [name] Electrophysiologist:  [name], MD    Patient Profile:   [name] is a 79 y.o. male with a history of paroxysmal atrial fibrillation/flutter on Amiodarone and Coumadin, CVA, hypertension, pancreatitis, seizures, and dementia who is being seen for evaluation of atrial fibrillation with RVR at the request of Dr. Avon Gully.  History of Present Illness:   [name] is a 79 year old male with the above history who is followed by Dr. Champ Mungo and Dr. Rayann Heman. Patient was admitted in 07/2019 with sepsis secondary to pneumonia and enteritis. Hospitalization complicated by atrial fibrillation with RVR. Echo during admission showed LVEF of 60-65% with normal wall motion, moderate bilateral pleural effusions, and mild dilated ascending aorta of 39 mm. He was started on IV Amiodarone and transitioned to 200mg  twice daily prior to discharge. He was in sinus rhythm at discharge. Of note, he is on Coumadin because he is unable to use NOACs with seizure medications. He was readmitted in 08/2019 with GI bleed with hemoglobin of 4.9. He received 3 units of PRBCs and was treated with Vitamin K. EGD showed showed a angiodysplasia in the duodenum s/p APC. Coumadin was held for 3 days post treatment and hemoglobin was 8.7 on discharge. He recently presented to the ED on 10/19/2019 with syncope and weakness. Code STEMI was initial called but this was cancelled. Patient was in sinus rhythm at the time. Head CT was negative. It was felt that this was likely neurocardiogenic syncope or a vasovagal event. He was felt to be stable for discharge. Patient's daughter expressed concerned at that time that patient's symptoms could be a side effect of Amiodarone.  Patient  returned to ED on 10/21/2019 for persistent weakness. Patient febrile with temp as high as 101 and tachycardic. EKG showed atrial fibrillation, rate 141 bpm, with RBBB and diffuse ST depressions. High-sensitivity troponin minimally elevated and flat at 18 >>22. Chest x-ray showed hyperexpanded lungs suggesting COPD and retrocardiac opacity. BNP elevated at 391. WBC 18.1, Hgb 9.3, Plts 316. Na 135, K 3.4, Glucose 239, BUN 14, Cr 0.86. Lactic acid 3.2. Blood cultures pending.  Respiratory panel negative. Abdominal x-ray showed mildly dilated air-filled loops of bowel throughout the abdomen with question of double wall sign which could represent pneumoperitoneum. Abdominal/pelvic CT showed moderately dilated gas and fluid-filled small bowel and colon with decompression terminal ileum felt to likely represent obstruction although ileus may also be present. Patient admitted for pneumonia and possible bowel obstruction. He was given 150mg  of IV Amiodarone. Cardiology consulted for further evaluation.  At the time of this evaluation, patient resting in bed breathing heavily. Patient reportedly has a history of dementia and he is not fully oriented. He could tell me his name, date of birth, and that he was at [facility] in [place]. He reports it is [place] is president. He states he has just felt really poorly for a few weeks but he could not really specify on any symptoms. He sounds like he is breathing heavily but denies any shortness of breath. He reported a cough to the ED provided but denied this to me. No chest pain, orthopnea, PND, or edema. No palpitations. He could not describe events before recent syncopal episode. He denies any fevers at home. He does note some nausea and  diarrhea but no abdominal pain or constipation. No hematochezia, hematuria, or melena.  Past Medical History:  Diagnosis Date   Diabetes mellitus without complication ([place])    Hiatal hernia    HTN (hypertension)    pt denies  h/o HTN though it appears he was started on spironolactone during his hospitalization 3/13   Pancreatitis    Seizures ([place])    Stroke [facility])    Typical atrial flutter ([place]) 3/13    Past Surgical History:  Procedure Laterality Date   ESOPHAGOGASTRODUODENOSCOPY N/A 11/30/2015   Procedure: ESOPHAGOGASTRODUODENOSCOPY (EGD);  Surgeon: Wonda Horner, MD;  Location: [name] ENDOSCOPY;  Service: Endoscopy;  Laterality: N/A;   ESOPHAGOGASTRODUODENOSCOPY N/A 09/11/2019   Procedure: ESOPHAGOGASTRODUODENOSCOPY (EGD);  Surgeon: Wonda Horner, MD;  Location: [facility] ENDOSCOPY;  Service: Endoscopy;  Laterality: N/A;   ESOPHAGOGASTRODUODENOSCOPY (EGD) WITH PROPOFOL N/A 07/23/2018   Procedure: ESOPHAGOGASTRODUODENOSCOPY (EGD) WITH PROPOFOL;  Surgeon: Otis Brace, MD;  Location: [place];  Service: Gastroenterology;  Laterality: N/A;   FEMUR IM NAIL Left 12/08/2016   FEMUR IM NAIL Left 12/07/2016   Procedure: INTRAMEDULLARY (IM) NAIL FEMORAL;  Surgeon: Renette Butters, MD;  Location: [place];  Service: Orthopedics;  Laterality: Left;   GIVENS CAPSULE STUDY N/A 07/24/2018   Procedure: GIVENS CAPSULE STUDY;  Surgeon: Otis Brace, MD;  Location: [place];  Service: Gastroenterology;  Laterality: N/A;   HIP SURGERY Left 01/2017   HOT HEMOSTASIS N/A 07/23/2018   Procedure: HOT HEMOSTASIS (ARGON PLASMA COAGULATION/BICAP);  Surgeon: Otis Brace, MD;  Location: [facility] ENDOSCOPY;  Service: Gastroenterology;  Laterality: N/A;   HOT HEMOSTASIS N/A 09/11/2019   Procedure: HOT HEMOSTASIS (ARGON PLASMA COAGULATION/BICAP);  Surgeon: Wonda Horner, MD;  [facility] ENDOSCOPY;  Service: Endoscopy;  Laterality: N/A;   no surgical history  06/2011     Home Medications:  Prior to Admission medications   Medication Sig Start Date End Date Taking? Authorizing Provider  amiodarone (PACERONE) 200 MG tablet Take 1 tablet (200 mg total) by mouth daily. Patient taking differently: Take 200 mg by mouth at  bedtime.  08/22/19  Yes Fenton, Clint R, PA  feeding supplement, ENSURE ENLIVE, (ENSURE ENLIVE) LIQD Take 237 mLs by mouth 3 (three) times daily between meals. Patient taking differently: Take 237 mLs by mouth 3 (three) times daily as needed (for supplementation).  07/26/19  Yes Hosie Poisson, MD  ferrous sulfate 325 (65 FE) MG tablet Take 1 tablet (325 mg total) by mouth 3 (three) times daily with meals. Patient taking differently: Take 325 mg by mouth daily with breakfast.  12/15/16  Yes Velvet Bathe, MD  folic acid (FOLVITE) 1 MG tablet Take 1 tablet (1 mg total) by mouth daily. 10/25/18  Yes Mercy Riding, MD  levETIRAcetam (KEPPRA) 500 MG tablet TAKE 3 TABLETS BY MOUTH TWICE A DAY Patient taking differently: Take 1,500 mg by mouth in the morning and at bedtime.  10/01/19  Yes Cameron Sprang, MD  Multiple Vitamin (MULTIVITAMIN WITH MINERALS) TABS tablet Take 1 tablet by mouth daily. 07/26/19  Yes Hosie Poisson, MD  pantoprazole (PROTONIX) 40 MG tablet Take 1 tablet (40 mg total) by mouth daily at 6 (six) AM. 09/13/19  Yes Eugenie Filler, MD  phenytoin (DILANTIN) 100 MG ER capsule Take 2 capsules (200 mg total) by mouth at bedtime. 10/01/19  Yes Cameron Sprang, MD  polyethylene glycol (MIRALAX / GLYCOLAX) 17 g packet Take 17 g by mouth daily as needed for mild constipation. Patient taking differently: Take 17 g by  mouth daily as needed for mild constipation (MIX INTO WATER AS DIRECTED AND DRINK).  10/24/18  Yes Mercy Riding, MD  warfarin (COUMADIN) 2.5 MG tablet TAKE AS DIRECTED BY COUMADIN CLINIC Patient taking differently: Take 2.5-5 mg by mouth See admin instructions. Take 5 mg by mouth at 6 PM in the evening on Sun/Tues/Wed/Thurs/Sat and 2.5 mg on Mon/Fri 10/15/19  Yes Allred, Jeneen Rinks, MD    Inpatient Medications: Scheduled Meds:  amiodarone  200 mg Oral QHS   docusate sodium  100 mg Oral BID   folic acid  1 mg Oral Daily   guaiFENesin  600 mg Oral BID   insulin aspart  0-9 Units  Subcutaneous Q4H   levETIRAcetam  1,500 mg Oral BID   pantoprazole  40 mg Oral Q0600   phenytoin  200 mg Oral QHS   sodium chloride flush  3 mL Intravenous Q12H   Continuous Infusions:  ampicillin-sulbactam (UNASYN) IV Stopped (10/22/19 0930)   doxycycline (VIBRAMYCIN) IV 100 mg (10/22/19 1047)   PRN Meds: acetaminophen **OR** acetaminophen, albuterol, HYDROcodone-acetaminophen, ondansetron **OR** ondansetron (ZOFRAN) IV  Allergies:   No Known Allergies  Social History:   Social History   Socioeconomic History   Marital status: Divorced    Spouse name: Not on file   Number of children: Not on file   Years of education: Not on file   Highest education level: Not on file  Occupational History   Not on file  Tobacco Use   Smoking status: Former Smoker    Quit date: 07/15/1966    Years since quitting: 53.3   Smokeless tobacco: Never Used  Vaping Use   Vaping Use: Never used  Substance and Sexual Activity   Alcohol use: No    Comment: Quit greater than 5 years ago   Drug use: No   Sexual activity: Not on file  Other Topics Concern   Not on file  Social History Narrative   Lives alone.  Retired from [employer]   Social Determinants of Radio broadcast assistant Strain:    Difficulty of Paying Living Expenses:   Food Insecurity: No Food Insecurity   Worried About Charity fundraiser in the Last Year: Never true   Arboriculturist in the Last Year: Never true  Transportation Needs: No Transportation Needs   Lack of Transportation (Medical): No   Lack of Transportation (Non-Medical): No  Physical Activity:    Days of Exercise per Week:    Minutes of Exercise per Session:   Stress:    Feeling of Stress :   Social Connections:    Frequency of Communication with Friends and Family:    Frequency of Social Gatherings with Friends and Family:    Attends Religious Services:    Active Member of Clubs or Organizations:    Attends Programme researcher, broadcasting/film/video:    Marital Status:   Intimate Partner Violence:    Fear of Current or Ex-Partner:    Emotionally Abused:    Physically Abused:    Sexually Abused:     Family History:   Family History  Problem Relation Age of Onset   Hypertension Other      ROS:  Please see the history of present illness.  Unable to get detailed ROS due to patient's mental status.     Physical Exam/Data:   Vitals:   10/22/19 0539 10/22/19 0700 10/22/19 0745 10/22/19 1000  BP:  107/76 (!) 101/60 (!) 96/61  Pulse:   Marland Kitchen)  124   Resp:  (!) 28 16 20   Temp: 98.6 F (37 C)     TempSrc: Oral     SpO2:   100%   Weight:      Height:       No intake or output data in the 24 hours ending 10/22/19 1058 Last 3 Weights 10/21/2019 10/19/2019 09/09/2019  Weight (lbs) 121 lb 4.1 oz 121 lb 7.6 oz 121 lb 8 oz  Weight (kg) 55 kg 55.1 kg 55.112 kg     Body mass index is 16.44 kg/m.  General: Frail cachetic 79 y.o. male with mild increased work of breathing but no significant distress. HEENT: Normocephalic and atraumatic.  Neck: Supple. No JVD. Heart: Tachycardic with irregular rhythm. Distinct S1 and S2. No murmurs, gallops, or rubs. Radial pulses 2+ and equal bilaterally. Lungs: Mild increased work of breathing. Possible rhonchi but sounds like it may be upper airway noises. No significant wheezes or crackles noted. Abdomen: Soft, non-distended, and non-tender to palpation. Bowel sounds present. MSK: Normal strength and tone for age. Extremities: No lower extremity edema. Skin: Warm and dry. Neuro: Alert but not fully oriented. Oriented to name, date of birth, and place (hospital and city) but not year or president. No focal deficits. Psych: Normal affect. Responds appropriately.   Relevant CV Studies:  Echocardiogram 07/24/2019: Impressions: 1. Left ventricular ejection fraction, by estimation, is 60 to 65%. The  left ventricle has normal function. The left ventricle has no regional  wall  motion abnormalities. Left ventricular diastolic function could not  be evaluated.  2. Right ventricular systolic function is normal. The right ventricular  size is normal.  3. Right atrial size was mildly dilated.  4. Moderate pleural effusion in both left and right lateral regions.  5. The mitral valve is normal in structure. Trivial mitral valve  regurgitation. No evidence of mitral stenosis.  6. The aortic valve is grossly normal. Aortic valve regurgitation is not  visualized. No aortic stenosis is present.  7. Aortic dilatation noted. There is mild dilatation of the ascending  aorta measuring 39 mm.  8. The inferior vena cava is normal in size with greater than 50%  respiratory variability, suggesting right atrial pressure of 3 mmHg.   Comparison(s): No significant change from prior study.    EKG: EKG personally reviewed and demonstrates: Wide complex tachycardia with irregular rhythm. Looks like atrial fibrillation, rate 141 bpm, with known RBBB. Diffuse ST depression.   Telemetry: Telemetry personally reviewed and demonstrates atrial fibrillation/flutter with rates in the 110's to 130's.   Laboratory Data:  High Sensitivity Troponin:   Recent Labs  Lab 10/19/19 1900 10/21/19 1742 10/21/19 1950  TROPONINIHS 4 18* 22*     Chemistry Recent Labs  Lab 10/19/19 1900 10/21/19 1607 10/22/19 0415  NA 138 135 136  K 4.3 3.4* 3.2*  CL 102 99 101  CO2 28 24 25   GLUCOSE 149* 239* 140*  BUN 13 14 14   CREATININE 0.82 0.86 0.70  CALCIUM 8.7* 8.9 8.3*  GFRNONAA >60 >60 >60  GFRAA >60 >60 >60  ANIONGAP 8 12 10     Recent Labs  Lab 10/19/19 1900 10/21/19 1742 10/22/19 0415  PROT 7.3 7.4 6.2*  ALBUMIN 3.8 3.6 3.0*  AST 36 46* 39  ALT 21 23 22   ALKPHOS 93 77 69  BILITOT 0.5 0.7 0.9   Hematology Recent Labs  Lab 10/19/19 1900 10/21/19 1607 10/22/19 0415  WBC 14.3* 18.1* 25.1*  RBC 3.41* 3.33* 3.10*  HGB  9.6* 9.3* 8.7*  HCT 31.7* 30.5* 27.8*  MCV 93.0  91.6 89.7  MCH 28.2 27.9 28.1  MCHC 30.3 30.5 31.3  RDW 20.8* 20.3* 20.2*  PLT 250 316 226   BNP Recent Labs  Lab 10/21/19 1742  BNP 391.1*    DDimer No results for input(s): DDIMER in the last 168 hours.   Radiology/Studies:  DG Chest 2 View  Result Date: 10/21/2019 CLINICAL DATA:  Cough, chest pain, weakness EXAM: CHEST - 2 VIEW COMPARISON:  Chest x-rays dated 10/19/2019 and 09/09/2019. FINDINGS: Heart size and mediastinal contours are stable. Lungs are hyperexpanded. Retrocardiac opacity, pneumonia versus atelectasis. No pleural effusion or pneumothorax is seen. No acute appearing osseous abnormality. IMPRESSION: 1. Retrocardiac opacity, pneumonia versus atelectasis. 2. Hyperexpanded lungs suggesting COPD. Electronically Signed   By: Franki Cabot [name]   On: 10/21/2019 16:41   DG Scapula Left  Result Date: 10/20/2019 CLINICAL DATA:  Fall and scapula pain EXAM: LEFT SCAPULA - 2+ VIEWS COMPARISON:  None. FINDINGS: There is no evidence of fracture or other focal bone lesions. Soft tissues are unremarkable. Glenohumeral joint and AC joint arthrosis is seen. IMPRESSION: Negative. Electronically Signed   By: Prudencio Pair [name]   On: 10/20/2019 00:15   DG Abd 1 View  Result Date: 10/21/2019 CLINICAL DATA:  Abdominal distension EXAM: ABDOMEN - 1 VIEW COMPARISON:  None. FINDINGS: Diffusely mildly dilated air-filled loops of bowel are seen throughout the abdomen. There is question of a double wall sign seen within the mid abdomen which could represent a small amount of pneumoperitoneum. Degenerative changes in the lumbar spine and bilateral hips. IMPRESSION: Mildly dilated air-filled loops of bowel throughout the abdomen with question of double wall sign which could represent pneumoperitoneum. If further evaluation is required would recommend lateral decubitus film or cross-sectional imaging. Electronically Signed   By: Prudencio Pair [name]   On: 10/21/2019 22:14   CT Head Wo Contrast  Result Date:  10/19/2019 CLINICAL DATA:  Syncope EXAM: CT HEAD WITHOUT CONTRAST TECHNIQUE: Contiguous axial images were obtained from the base of the skull through the vertex without intravenous contrast. COMPARISON:  CT brain 12/06/2016 FINDINGS: Brain: No acute territorial infarction, hemorrhage or new intracranial mass. Encephalomalacia in the left temporal lobe as before. Moderate atrophy. Mild hypodensity in the white matter consistent with chronic small vessel ischemic change. Stable 1.2 cm partially calcified extra-axial mass along the right cranial vertex likely a meningioma. Stable ventricle size. Vascular: No hyperdense vessels.  Carotid vascular calcification. Skull: Normal. Negative for fracture or focal lesion. Sinuses/Orbits: Mild mucosal thickening in the ethmoid and maxillary sinuses. Old appearing deformity medial wall right orbit Other: None IMPRESSION: 1. No CT evidence for acute intracranial abnormality. 2. Atrophy and mild chronic small vessel ischemic changes of the white matter. Remote left temporal lobe infarct. 3. Stable 1.2 cm partially calcified extra-axial mass along the right cranial vertex, likely meningioma. Electronically Signed   By: Donavan Foil [name]   On: 10/19/2019 23:07   CT ABDOMEN PELVIS W CONTRAST  Result Date: 10/21/2019 CLINICAL DATA:  Free air seen on x-ray. Peritonitis or perforation suspected. History of pancreatitis and hiatal hernia. EXAM: CT ABDOMEN AND PELVIS WITH CONTRAST TECHNIQUE: Multidetector CT imaging of the abdomen and pelvis was performed using the standard protocol following bolus administration of intravenous contrast. CONTRAST:  187mL OMNIPAQUE IOHEXOL 300 MG/ML  SOLN COMPARISON:  Abdomen 10/21/2019.  CT abdomen and pelvis 07/21/2019 FINDINGS: Lower chest: Airspace consolidation in both lung bases. Small pericardial effusion. Cardiac enlargement.  Hepatobiliary: Scattered calcifications throughout the liver, likely granulomas. No focal lesions. Portal veins are  patent. Gallbladder is decompressed. No bile duct dilatation. Pancreas: Atrophic.  No mass or ductal dilatation appreciated. Spleen: Multiple calcifications, likely granulomas. Adrenals/Urinary Tract: No adrenal gland nodules. Kidneys are symmetrical. No renal mass lesion or hydronephrosis. No hydroureter. Bladder is normal. Stomach/Bowel: Moderately dilated gas and fluid-filled small bowel. Portions of the colon are also dilated. No wall thickening or mass appreciated. Terminal ileum is decompressed. Changes likely represent obstruction although ileus may also be present. No free air. Vascular/Lymphatic: Calcific aortic atherosclerosis. No aortic aneurysm. No significant lymphadenopathy. Reproductive: Prostate is unremarkable. Other: No abdominal wall hernia or abnormality. No abdominopelvic ascites. Musculoskeletal: Internal fixation of the left hip. Heterotopic bone formation. Degenerative changes in the spine and hips. Ankylosis of the thoracolumbar spine. IMPRESSION: 1. Moderately dilated gas and fluid-filled small bowel and colon with decompressed terminal ileum. Changes likely represent obstruction although ileus may also be present. No wall thickening or mass appreciated. 2. Airspace consolidation in both lung bases may represent pneumonia or aspiration. 3. Cardiac enlargement with small pericardial effusion. 4. Aortic atherosclerosis. 5. Calcified granulomas in the liver and spleen. 6. Internal fixation of the left hip. Heterotopic bone formation. Aortic Atherosclerosis (ICD10-I70.0). Electronically Signed   By: Lucienne Capers [name]   On: 10/21/2019 23:42   DG Chest Port 1 View  Result Date: 10/19/2019 CLINICAL DATA:  Questionable sepsis.  Evaluate for abnormality. EXAM: PORTABLE CHEST 1 VIEW COMPARISON:  Prior chest radiographs 09/09/2019 and earlier. FINDINGS: Please note the right lateral costophrenic angle is not entirely included in the field of view. Heart size within normal limits. No  appreciable airspace consolidation. No evidence of pleural effusion or pneumothorax. No acute bony abnormality identified. IMPRESSION: Please note a small portion of the right lateral costophrenic angle is excluded from the field of view. No evidence of acute cardiopulmonary abnormality. Electronically Signed   By: Kellie Simmering DO   On: 10/19/2019 20:07   Assessment and Plan:   Paroxysmal Atrial Fibrillation with RVR - Patient presented in atrial fibrillation with RVR in setting of pneumonia and possible bowel obstruction. - Telemetry reviewed and looks like atrial fibrillation (looks like flutter at times) with rates in the 110's to 130's. - Potassium 3.2. Goal >4.0. Supplement as needed. - Magnesium 1.5 on admission and then 1.8 on repeat after repletion. Goal >2.0. Supplement as needed. - TSH normal. - Echo pending.  - Will start IV Amiodarone. Can re-bolus with another 150mg  but suspect much of tachycardia is physiologic response to underlying illness. - On Coumadin at home. INR 2.8. Patient is current NPO for possible bowel obstruction/ileus; therefore, can switch to Heparin. - Patient looks cachetic and very frail. May be worth considering palliative care consult as well.  Demand Ischemia - High-sensitivity troponin minimally elevated and flat 18 >> 22.  - EKG showed diffuse ST depression when in atrial fibrillation with rates in the 140's. - No angina.  - Can consider stress test once patient recovers from acute illness.   Community Acquired Pneumonia - Chest x-ray showed retrocardiac opacity. - Febrile on admission with temp as high as 101.  - WBC 18.1 >> 25.1. - Lactic 3.2 >> 3.1 >> 2.1. - Blood/sputum cultures pending. - Continue antibiotics per primary team.  Bowel Obstruction vs Ileus - CT showed moderately dilated gas and fluid-filled small bowel and colon with decompression terminal ileum felt to likely represent obstruction although ileus may also be present. - GI has  been consulted. Currently NPO.  Hypertension - BP somewhat labile. Systolic BP ranging from mid 90's to 130's. - Not currently on any antihypertensives. - Continue to monitor.  Otherwise, per primary team.  For questions or updates, please contact [place] Please consult [email or web address] for contact info under    Signed, Darreld Mclean, PA-C  10/22/2019 10:58 AM

## 2019-10-22 NOTE — Progress Notes (Signed)
ANTICOAGULATION CONSULT NOTE - Initial Consult  Pharmacy Consult for heparin Indication: atrial fibrillation  No Known Allergies  Patient Measurements: Height: 6' (182.9 cm) Weight: 55 kg (121 lb 4.1 oz) IBW/kg (Calculated) : 77.6 Heparin Dosing Weight: TBW  Vital Signs: Temp: 98.6 F (37 C) (08/02 0539) Temp Source: Oral (08/02 0539) BP: 96/61 (08/02 1000) Pulse Rate: 124 (08/02 0745)  Labs: Recent Labs    10/19/19 1900 10/19/19 1900 10/21/19 1607 10/21/19 1742 10/21/19 1950 10/22/19 0415  HGB 9.6*   < > 9.3*  --   --  8.7*  HCT 31.7*  --  30.5*  --   --  27.8*  PLT 250  --  316  --   --  226  APTT 40*  --   --   --   --   --   LABPROT 33.0*  --   --  25.3*  --  28.3*  INR 3.4*  --   --  2.4*  --  2.8*  CREATININE 0.82  --  0.86  --   --  0.70  TROPONINIHS 4  --   --  18* 22*  --    < > = values in this interval not displayed.    Estimated Creatinine Clearance: 57.3 mL/min (by C-G formula based on SCr of 0.7 mg/dL).   Medical History: Past Medical History:  Diagnosis Date  . Diabetes mellitus without complication ([place])   . Hiatal hernia   . HTN (hypertension)    pt denies h/o HTN though it appears he was started on spironolactone during his hospitalization 3/13  . Pancreatitis   . Seizures ([place])   . Stroke ([place])   . Typical atrial flutter ([place]) 3/13   Assessment: 46 YOM presenting generalized fatigue and low grade fever, hx of Afib on warfarin PTA which was continued here.  Also with obstruction/Ileus and to be NPO, for heparin start.  INR today therapeutic at 2.8, last dose administered 8/1.    Goal of Therapy:  Heparin level 0.3-0.7 units/ml Monitor platelets by anticoagulation protocol: Yes   Plan:  Start heparin gtt when INR subtherapeutic <2 Daily INR, heparin level, CBC, s/s bleeding F/u GI plans and ability to restart warfarin  Bertis Ruddy, PharmD Clinical Pharmacist ED Pharmacist Phone # [phone] 10/22/2019 2:29 PM

## 2019-10-22 NOTE — Progress Notes (Signed)
Patient resting heart rate is in afib and patient is coughing but scheduled medications administered. Patient heart rate is gradually decreasing under 120's and coughing is decreasing. Patient is currently having productive coughs in comparison to shift change when the cough was dry and non-productive. Patient is also receiving antibiotics and is afebrile. Charge RN aware of yellow mews.

## 2019-10-22 NOTE — ED Notes (Signed)
Upon starting the amio pt started to experience intense pain along vein where medication was being infused. Pt previously had an IV infiltrate near that area. IV team consult ordered.

## 2019-10-22 NOTE — ED Notes (Signed)
Pt family at bedside

## 2019-10-22 NOTE — Telephone Encounter (Signed)
Called and left message for patient to call back.  Need to schedule sooner appt than September to discuss side effects of amniodarone with patient per staff message from [place].

## 2019-10-22 NOTE — Progress Notes (Signed)
Pharmacy Antibiotic Note  [name] is a 79 y.o. male admitted on 10/21/2019 with possible aspiration PNA.  Pharmacy has been consulted for Unasyn dosing. WBC elevated. Renal function ok.  Plan: Unasyn 3g IV q8h Trend WBC, temp, renal function  F/U infectious work-up   Height: 6' (182.9 cm) Weight: 55 kg (121 lb 4.1 oz) IBW/kg (Calculated) : 77.6  Temp (24hrs), Avg:100.1 F (37.8 C), Min:98.8 F (37.1 C), Max:101 F (38.3 C)  Recent Labs  Lab 10/19/19 1900 10/21/19 1607 10/21/19 1743  WBC 14.3* 18.1*  --   CREATININE 0.82 0.86  --   LATICACIDVEN  --  3.2* 3.1*    Estimated Creatinine Clearance: 53.3 mL/min (by C-G formula based on SCr of 0.86 mg/dL).    No Known Allergies  Narda Bonds, PharmD, BCPS Clinical Pharmacist Phone: [phone]

## 2019-10-22 NOTE — ED Notes (Signed)
Paged Attending for RN [name]

## 2019-10-22 NOTE — Progress Notes (Signed)
  Echocardiogram 2D Echocardiogram has been performed.  [name] 10/22/2019, 1:44 PM

## 2019-10-23 ENCOUNTER — Encounter (HOSPITAL_COMMUNITY): Payer: Self-pay | Admitting: Cardiovascular Disease

## 2019-10-23 ENCOUNTER — Inpatient Hospital Stay (HOSPITAL_COMMUNITY): Payer: Medicare Other

## 2019-10-23 DIAGNOSIS — R778 Other specified abnormalities of plasma proteins: Secondary | ICD-10-CM

## 2019-10-23 LAB — GLUCOSE, CAPILLARY
Glucose-Capillary: 110 mg/dL — ABNORMAL HIGH (ref 70–99)
Glucose-Capillary: 110 mg/dL — ABNORMAL HIGH (ref 70–99)
Glucose-Capillary: 111 mg/dL — ABNORMAL HIGH (ref 70–99)
Glucose-Capillary: 111 mg/dL — ABNORMAL HIGH (ref 70–99)
Glucose-Capillary: 109 mg/dL — ABNORMAL HIGH (ref 70–99)
Glucose-Capillary: 97 mg/dL (ref 70–99)
Glucose-Capillary: 99 mg/dL (ref 70–99)
Glucose-Capillary: 95 mg/dL (ref 70–99)

## 2019-10-23 LAB — COMPREHENSIVE METABOLIC PANEL
ALT: 22 U/L (ref 0–44)
AST: 34 U/L (ref 15–41)
Albumin: 2.6 g/dL — ABNORMAL LOW (ref 3.5–5.0)
Alkaline Phosphatase: 59 U/L (ref 38–126)
Anion gap: 13 (ref 5–15)
BUN: 11 mg/dL (ref 8–23)
CO2: 24 mmol/L (ref 22–32)
Calcium: 7.7 mg/dL — ABNORMAL LOW (ref 8.9–10.3)
Chloride: 102 mmol/L (ref 98–111)
Creatinine, Ser: 0.79 mg/dL (ref 0.61–1.24)
GFR calc Af Amer: >60 mL/min (ref >60)
GFR calc non Af Amer: >60 mL/min (ref >60)
Glucose, Bld: 114 mg/dL — ABNORMAL HIGH (ref 70–99)
Potassium: 2.8 mmol/L — ABNORMAL LOW (ref 3.5–5.1)
Sodium: 139 mmol/L (ref 135–145)
Total Bilirubin: 1 mg/dL (ref 0.3–1.2)
Total Protein: 5.4 g/dL — ABNORMAL LOW (ref 6.5–8.1)

## 2019-10-23 LAB — CBC
HCT: 24.7 % — ABNORMAL LOW (ref 39.0–52.0)
Hemoglobin: 7.8 g/dL — ABNORMAL LOW (ref 13.0–17.0)
MCH: 28.3 pg (ref 26.0–34.0)
MCHC: 31.6 g/dL (ref 30.0–36.0)
MCV: 89.5 fL (ref 80.0–100.0)
Platelets: 231 10*3/uL (ref 150–400)
RBC: 2.76 MIL/uL — ABNORMAL LOW (ref 4.22–5.81)
RDW: 20.3 % — ABNORMAL HIGH (ref 11.5–15.5)
WBC: 11.1 10*3/uL — ABNORMAL HIGH (ref 4.0–10.5)
nRBC: 0 % (ref 0.0–0.2)

## 2019-10-23 LAB — PROTIME-INR
INR: 3.3 — ABNORMAL HIGH (ref 0.8–1.2)
Prothrombin Time: 32.8 seconds — ABNORMAL HIGH (ref 11.4–15.2)

## 2019-10-23 LAB — PROCALCITONIN: Procalcitonin: 1.3 ng/mL

## 2019-10-23 MED ORDER — LEVETIRACETAM IN NACL 1500 MG/100ML IV SOLN
1500.0000 mg | Freq: Two times a day (BID) | INTRAVENOUS | Status: DC
  Administered 2019-10-23 – 2019-10-24 (×2): 1500 mg via INTRAVENOUS
  Filled 2019-10-23 (×3): qty 100

## 2019-10-23 MED ORDER — POTASSIUM CHLORIDE 10 MEQ/100ML IV SOLN
10.0000 meq | INTRAVENOUS | Status: AC
  Administered 2019-10-23 (×4): 10 meq via INTRAVENOUS
  Filled 2019-10-23 (×4): qty 100

## 2019-10-23 MED ORDER — PHENYTOIN SODIUM 50 MG/ML IJ SOLN
100.0000 mg | Freq: Two times a day (BID) | INTRAMUSCULAR | Status: DC
  Administered 2019-10-23: 100 mg via INTRAVENOUS
  Filled 2019-10-23 (×3): qty 2

## 2019-10-23 MED ORDER — BISACODYL 10 MG RE SUPP: 10.0000 mg | Freq: Every day | RECTAL | Status: DC | PRN

## 2019-10-23 MED ORDER — PANTOPRAZOLE SODIUM 40 MG IV SOLR
40.0000 mg | Freq: Two times a day (BID) | INTRAVENOUS | Status: DC
  Administered 2019-10-23 – 2019-10-24 (×4): 40 mg via INTRAVENOUS
  Filled 2019-10-23 (×4): qty 40

## 2019-10-23 MED ORDER — PHENYTOIN 50 MG PO CHEW
100.0000 mg | CHEWABLE_TABLET | Freq: Two times a day (BID) | ORAL | Status: DC
  Filled 2019-10-23: qty 2

## 2019-10-23 MED ORDER — SODIUM CHLORIDE 0.9 % IV SOLN: INTRAVENOUS | Status: DC

## 2019-10-23 MED ORDER — MORPHINE SULFATE (PF) 2 MG/ML IV SOLN
0.5000 mg | INTRAVENOUS | Status: DC | PRN
  Administered 2019-10-23 – 2019-10-27 (×5): 0.5 mg via INTRAVENOUS
  Filled 2019-10-23 (×5): qty 1

## 2019-10-23 NOTE — Progress Notes (Signed)
MEDICATION RELATED CONSULT NOTE - INITIAL   Pharmacy Consult for Phenytoin - transition ER tablet to IV  Indication: History of seizures  No Known Allergies  Patient Measurements: Height: 6' (182.9 cm) Weight: 55 kg (121 lb 4.1 oz) IBW/kg (Calculated) : 77.6  Vital Signs: Temp: 98.2 F (36.8 C) (08/03 1200) Temp Source: Oral (08/03 1200) BP: 112/53 (08/03 1200) Pulse Rate: 88 (08/03 1200) Intake/Output from previous day: 08/02 0701 - 08/03 0700 In: 2044.8 [P.O.:200; I.V.:578.1; IV Piggyback:1266.7] Out: -  Intake/Output from this shift: Total I/O In: 439.8 [I.V.:123.5; IV Piggyback:316.3] Out: -   Labs: Recent Labs    10/21/19 1607 10/21/19 1742 10/21/19 2339 10/22/19 0415 10/23/19 0140  WBC 18.1*  --   --  25.1* 11.1*  HGB 9.3*  --   --  8.7* 7.8*  HCT 30.5*  --   --  27.8* 24.7*  PLT 316  --   --  226 231  CREATININE 0.86  --   --  0.70 0.79  MG  --   --  1.5* 1.8  --   PHOS  --   --  2.2* 2.1*  --   ALBUMIN  --  3.6  --  3.0* 2.6*  PROT  --  7.4  --  6.2* 5.4*  AST  --  46*  --  39 34  ALT  --  23  --  22 22  ALKPHOS  --  77  --  69 59  BILITOT  --  0.7  --  0.9 1.0  BILIDIR  --  0.2  --   --   --   IBILI  --  0.5  --   --   --    Estimated Creatinine Clearance: 57.3 mL/min (by C-G formula based on SCr of 0.79 mg/dL).   Assessment: Patient is on Phenytoin and Keppra for history of seizures. Pharmacy consult received to transition Phenytoin ER to IV forumulation in setting of NPO status with SBO versus ileus.   Last Phenytoin level was 12.3 - therapeutic. Last Phenytoin ER dose was given 10/22/19 at 2200 PM.  Albumin trending down at 2.6 today - monitor.  Goal of Therapy:  Phenytoin level 10-20  Plan:  Change Phenytoin to 100 mg IV every 12 hours for now.  Consider level in 3-4 days since change in formulation if maintained on IV.  Monitor for ability to resume ER oral.   Sloan Leiter, PharmD, BCPS, BCCCP Clinical Pharmacist Please refer to  [facility] for [place] numbers 10/23/2019,3:47 PM

## 2019-10-23 NOTE — Progress Notes (Addendum)
Progress Note  Patient Name: [name] Date of Encounter: 10/23/2019  [facility] Cardiologist: Fransico Him, MD   Subjective   No acute overnight events. Rates slightly improves. Patient denies any abdominal pain or nausea today. Denies any chest pain or shortness of breath.  Inpatient Medications    Scheduled Meds: . docusate sodium  100 mg Oral BID  . folic acid  1 mg Oral Daily  . guaiFENesin  600 mg Oral BID  . insulin aspart  0-9 Units Subcutaneous Q4H  . levETIRAcetam  1,500 mg Oral BID  . pantoprazole  40 mg Oral Q0600  . phenytoin  200 mg Oral QHS  . sodium chloride flush  3 mL Intravenous Q12H   Continuous Infusions: . amiodarone 30 mg/hr (10/23/19 0300)  . ampicillin-sulbactam (UNASYN) IV 3 g (10/23/19 0609)  . doxycycline (VIBRAMYCIN) IV Stopped (10/23/19 0108)  . potassium chloride 10 mEq (10/23/19 0817)   PRN Meds: acetaminophen **OR** acetaminophen, albuterol, HYDROcodone-acetaminophen, ondansetron **OR** ondansetron (ZOFRAN) IV   Vital Signs    Vitals:   10/22/19 2127 10/22/19 2300 10/23/19 0307 10/23/19 0730  BP:  128/63  (!) 107/54  Pulse:  (!) 129  (!) 103  Resp:  20  15  Temp: 99.5 F (37.5 C) 99 F (37.2 C) 99.2 F (37.3 C) 97.6 F (36.4 C)  TempSrc: Oral  Oral Oral  SpO2:  100%  100%  Weight:      Height:        Intake/Output Summary (Last 24 hours) at 10/23/2019 0830 Last data filed at 10/23/2019 0300 Gross per 24 hour  Intake 2044.79 ml  Output --  Net 2044.79 ml   Last 3 Weights 10/21/2019 10/19/2019 09/09/2019  Weight (lbs) 121 lb 4.1 oz 121 lb 7.6 oz 121 lb 8 oz  Weight (kg) 55 kg 55.1 kg 55.112 kg      Telemetry    Atrial fibrillation/flutter with rates in the 100's to 110's. - Personally Reviewed  ECG    No new ECG tracing today. - Personally Reviewed  Physical Exam   GEN: Frail, cachetic, ill appearing male resting in no acute distress.   Neck: No JVD. Cardiac: Tachycardic with irregularly irregular  rhythm. No murmurs, rubs, or gallops.  Respiratory: Clear to auscultation bilaterally. No wheezes, rhonchi, or rales. GI: Soft, mild distended. Non-tender. MS: No lower extremity edema. No deformity. Skin: Warm and dry. Neuro:  No focal deficits. Psych: Normal affect.  Labs    High Sensitivity Troponin:   Recent Labs  Lab 10/19/19 1900 10/21/19 1742 10/21/19 1950  TROPONINIHS 4 18* 22*      Chemistry Recent Labs  Lab 10/19/19 1900 10/21/19 1607 10/21/19 1742 10/22/19 0415 10/23/19 0140  NA  --  135  --  136 139  K  --  3.4*  --  3.2* 2.8*  CL  --  99  --  101 102  CO2  --  24  --  25 24  GLUCOSE  --  239*  --  140* 114*  BUN  --  14  --  14 11  CREATININE  --  0.86  --  0.70 0.79  CALCIUM  --  8.9  --  8.3* 7.7*  PROT   < >  --  7.4 6.2* 5.4*  ALBUMIN   < >  --  3.6 3.0* 2.6*  AST   < >  --  46* 39 34  ALT   < >  --  23 22  22  ALKPHOS   < >  --  77 69 59  BILITOT   < >  --  0.7 0.9 1.0  GFRNONAA  --  >60  --  >60 >60  GFRAA  --  >60  --  >60 >60  ANIONGAP  --  12  --  10 13   < > = values in this interval not displayed.     Hematology Recent Labs  Lab 10/21/19 1607 10/22/19 0415 10/23/19 0140  WBC 18.1* 25.1* 11.1*  RBC 3.33* 3.10* 2.76*  HGB 9.3* 8.7* 7.8*  HCT 30.5* 27.8* 24.7*  MCV 91.6 89.7 89.5  MCH 27.9 28.1 28.3  MCHC 30.5 31.3 31.6  RDW 20.3* 20.2* 20.3*  PLT 316 226 231    BNP Recent Labs  Lab 10/21/19 1742  BNP 391.1*     DDimer No results for input(s): DDIMER in the last 168 hours.   Radiology    DG Chest 2 View  Result Date: 10/21/2019 CLINICAL DATA:  Cough, chest pain, weakness EXAM: CHEST - 2 VIEW COMPARISON:  Chest x-rays dated 10/19/2019 and 09/09/2019. FINDINGS: Heart size and mediastinal contours are stable. Lungs are hyperexpanded. Retrocardiac opacity, pneumonia versus atelectasis. No pleural effusion or pneumothorax is seen. No acute appearing osseous abnormality. IMPRESSION: 1. Retrocardiac opacity, pneumonia versus  atelectasis. 2. Hyperexpanded lungs suggesting COPD. Electronically Signed   By: Franki Cabot [name]   On: 10/21/2019 16:41   DG Abd 1 View  Result Date: 10/21/2019 CLINICAL DATA:  Abdominal distension EXAM: ABDOMEN - 1 VIEW COMPARISON:  None. FINDINGS: Diffusely mildly dilated air-filled loops of bowel are seen throughout the abdomen. There is question of a double wall sign seen within the mid abdomen which could represent a small amount of pneumoperitoneum. Degenerative changes in the lumbar spine and bilateral hips. IMPRESSION: Mildly dilated air-filled loops of bowel throughout the abdomen with question of double wall sign which could represent pneumoperitoneum. If further evaluation is required would recommend lateral decubitus film or cross-sectional imaging. Electronically Signed   By: Prudencio Pair [name]   On: 10/21/2019 22:14   CT ABDOMEN PELVIS W CONTRAST  Result Date: 10/21/2019 CLINICAL DATA:  Free air seen on x-ray. Peritonitis or perforation suspected. History of pancreatitis and hiatal hernia. EXAM: CT ABDOMEN AND PELVIS WITH CONTRAST TECHNIQUE: Multidetector CT imaging of the abdomen and pelvis was performed using the standard protocol following bolus administration of intravenous contrast. CONTRAST:  162mL OMNIPAQUE IOHEXOL 300 MG/ML  SOLN COMPARISON:  Abdomen 10/21/2019.  CT abdomen and pelvis 07/21/2019 FINDINGS: Lower chest: Airspace consolidation in both lung bases. Small pericardial effusion. Cardiac enlargement. Hepatobiliary: Scattered calcifications throughout the liver, likely granulomas. No focal lesions. Portal veins are patent. Gallbladder is decompressed. No bile duct dilatation. Pancreas: Atrophic.  No mass or ductal dilatation appreciated. Spleen: Multiple calcifications, likely granulomas. Adrenals/Urinary Tract: No adrenal gland nodules. Kidneys are symmetrical. No renal mass lesion or hydronephrosis. No hydroureter. Bladder is normal. Stomach/Bowel: Moderately dilated gas and  fluid-filled small bowel. Portions of the colon are also dilated. No wall thickening or mass appreciated. Terminal ileum is decompressed. Changes likely represent obstruction although ileus may also be present. No free air. Vascular/Lymphatic: Calcific aortic atherosclerosis. No aortic aneurysm. No significant lymphadenopathy. Reproductive: Prostate is unremarkable. Other: No abdominal wall hernia or abnormality. No abdominopelvic ascites. Musculoskeletal: Internal fixation of the left hip. Heterotopic bone formation. Degenerative changes in the spine and hips. Ankylosis of the thoracolumbar spine. IMPRESSION: 1. Moderately dilated gas and fluid-filled small bowel and  colon with decompressed terminal ileum. Changes likely represent obstruction although ileus may also be present. No wall thickening or mass appreciated. 2. Airspace consolidation in both lung bases may represent pneumonia or aspiration. 3. Cardiac enlargement with small pericardial effusion. 4. Aortic atherosclerosis. 5. Calcified granulomas in the liver and spleen. 6. Internal fixation of the left hip. Heterotopic bone formation. Aortic Atherosclerosis (ICD10-I70.0). Electronically Signed   By: Lucienne Capers [name]   On: 10/21/2019 23:42   ECHOCARDIOGRAM COMPLETE  Result Date: 10/22/2019    ECHOCARDIOGRAM REPORT   Patient Name:   SPYROS WINCH [place] Date of Exam: 10/22/2019 Medical Rec #:  [id number]            Height:       72.0 in Accession #:    [id number]           Weight:       121.3 lb Date of Birth:  May 13, 1940             BSA:          1.722 m Patient Age:    10 years             BP:           101/60 mmHg Patient Gender: M                    HR:           119 bpm. Exam Location:  Inpatient Procedure: 2D Echo Indications:    780.2 syncope  History:        Patient has prior history of Echocardiogram examinations, most                 recent 07/24/2019. Stroke, Arrythmias:Atrial Flutter; Risk                 Factors:Diabetes, Hypertension and  Former Smoker.  Sonographer:    Jannett Celestine RDCS (AE) Referring Phys: [id number] ANASTASSIA DOUTOVA  Sonographer Comments: Image acquisition challenging due to respiratory motion. IMPRESSIONS  1. Left ventricular ejection fraction, by estimation, is 65 to 70%. The left ventricle has normal function. Left ventricular endocardial border not optimally defined to evaluate regional wall motion. There is mild left ventricular hypertrophy. Left ventricular diastolic parameters are indeterminate.  2. Right ventricular systolic function is normal. The right ventricular size is normal. Tricuspid regurgitation signal is inadequate for assessing PA pressure.  3. Right atrial size was mildly dilated.  4. The mitral valve is grossly normal. Trivial mitral valve regurgitation.  5. The aortic valve was not well visualized. Aortic valve regurgitation is not visualized. No aortic stenosis is present.  6. The inferior vena cava is dilated in size with >50% respiratory variability, suggesting right atrial pressure of 8 mmHg. FINDINGS  Left Ventricle: Left ventricular ejection fraction, by estimation, is 65 to 70%. The left ventricle has normal function. Left ventricular endocardial border not optimally defined to evaluate regional wall motion. The left ventricular internal cavity size was normal in size. There is mild left ventricular hypertrophy. Left ventricular diastolic parameters are indeterminate. Right Ventricle: The right ventricular size is normal. No increase in right ventricular wall thickness. Right ventricular systolic function is normal. Tricuspid regurgitation signal is inadequate for assessing PA pressure. Left Atrium: Left atrial size was normal in size. Right Atrium: Right atrial size was mildly dilated. Pericardium: Trivial pericardial effusion is present. The pericardial effusion is surrounding the apex. Mitral Valve: The mitral valve is grossly normal. Trivial mitral valve  regurgitation. Tricuspid Valve: The tricuspid  valve is grossly normal. Tricuspid valve regurgitation is trivial. Aortic Valve: The aortic valve was not well visualized. Aortic valve regurgitation is not visualized. No aortic stenosis is present. Pulmonic Valve: The pulmonic valve was not well visualized. Pulmonic valve regurgitation is not visualized. Aorta: The ascending aorta was not well visualized. Venous: The inferior vena cava was not well visualized. The inferior vena cava is dilated in size with greater than 50% respiratory variability, suggesting right atrial pressure of 8 mmHg. IAS/Shunts: The interatrial septum was not well visualized.  LEFT VENTRICLE PLAX 2D LVIDd:         2.60 cm LVIDs:         2.00 cm LV PW:         1.20 cm LV IVS:        1.20 cm LVOT diam:     2.20 cm LV SV:         50 LV SV Index:   29 LVOT Area:     3.80 cm  LEFT ATRIUM           Index       RIGHT ATRIUM           Index LA diam:      3.10 cm 1.80 cm/m  RA Area:     14.90 cm LA Vol (A2C): 33.1 ml 19.22 ml/m RA Volume:   39.20 ml  22.76 ml/m  AORTIC VALVE LVOT Vmax:   110.00 cm/s LVOT Vmean:  68.700 cm/s LVOT VTI:    0.132 m  AORTA Ao Root diam: 3.30 cm  SHUNTS Systemic VTI:  0.13 m Systemic Diam: 2.20 cm Cherlynn Kaiser MD Electronically signed by Cherlynn Kaiser MD Signature Date/Time: 10/22/2019/6:14:52 PM    Final     Cardiac Studies   Echocardiogram 10/22/2019: Impressions: 1. Left ventricular ejection fraction, by estimation, is 65 to 70%. The  left ventricle has normal function. Left ventricular endocardial border  not optimally defined to evaluate regional wall motion. There is mild left  ventricular hypertrophy. Left  ventricular diastolic parameters are indeterminate.  2. Right ventricular systolic function is normal. The right ventricular  size is normal. Tricuspid regurgitation signal is inadequate for assessing  PA pressure.  3. Right atrial size was mildly dilated.  4. The mitral valve is grossly normal. Trivial mitral valve  regurgitation.    5. The aortic valve was not well visualized. Aortic valve regurgitation  is not visualized. No aortic stenosis is present.  6. The inferior vena cava is dilated in size with >50% respiratory  variability, suggesting right atrial pressure of 8 mmHg.   Patient Profile     [name] is a 79 y.o. male with a history of paroxysmal atrial fibrillation/flutter on Amiodarone and Coumadin, CVA, hypertension, pancreatitis, seizures, and dementia who is being seen for evaluation of atrial fibrillation with RVR at the request of Dr. Avon Gully.  Assessment & Plan    Paroxysmal Atrial Fibrillation with RVR - Patient presented in atrial fibrillation with RVR in setting of pneumonia and bowel obstruction. - Telemetry reviewed and looks like atrial fibrillation/flutter with rates in the 100's to 110's. - Potassium 2.8 today. Being repleted. - Magnesium 1.8 yesterday. Will repeat tomorrow. - TSH normal. - Echo showed LVEF of 60-65% with normal wall motion. - Continue IV Amiodarone until patient can take PO. - Suspect RVR is physiologic response due to underlying illness and suspect rates to improves as pneumonia and bowel obstruction improves. -  On Coumadin at home. INR 3.3 today. Pharmacy following and will start IV Heparin when INR <2 if patient still NPO for bowel obstruction.  Demand Ischemia - High-sensitivity troponin minimally elevated and flat 18 >> 22. Not consistent with ACS.  - EKG showed diffuse ST depression when in atrial fibrillation with rates in the 140's. - No angina.  - Suspect demand ischemia in setting of sepsis with acute respiratory failure. No plans for ischemic evaluation at this time.  Sepsis Secondary to Community Acquired Pneumonia - Chest x-ray showed retrocardiac opacity. - Febrile on admission with temp as high as 101.  - WBC 18.1 >> 25.1. - Lactic 3.2 >> 3.1 >> 2.1. - Blood/sputum cultures pending. - Continue antibiotics per primary team.  Bowel  Obstruction - CT concerning for a partial distal small bowel obstruction. - GI has been consulted and plan is for conservative management at this time.  Hypertension - BP soft at times but stable. - Not currently on any antihypertensives. - Continue to monitor.  Hypokalemia - Potassium 2.8 today. Being repleted.  - Continue to monitor closely.  Otherwise, per primary team. Of note, patient appears very cachetic and frail. May benefit from Palliative Care consult.  For questions or updates, please contact [place] Please consult [email or web address] for contact info under        Signed, Darreld Mclean, PA-C  10/23/2019, 8:30 AM

## 2019-10-23 NOTE — Progress Notes (Signed)
[facility] Gastroenterology Progress Note  [name] 79 y.o. 1940/03/25  CC:  SBO  Subjective: Patient denies abdominal pain.  Several episodes of nausea and dry heaving over night per RN.  Per flow sheet, no BMs since admission.  ROS : Limited by dementia, though patient denies any complaints. Review of Systems  Cardiovascular: Negative for chest pain and palpitations.  Gastrointestinal: Positive for nausea. Negative for abdominal pain, blood in stool, constipation, diarrhea, heartburn, melena and vomiting.   Objective: Vital signs in last 24 hours: Vitals:   10/23/19 0307 10/23/19 0730  BP:  (!) 107/54  Pulse:  (!) 103  Resp:  15  Temp: 99.2 F (37.3 C) 97.6 F (36.4 C)  SpO2:  100%    Physical Exam:  General:  Lethargic, cooperative, confused, elderly, no acute distress  Head:  Normocephalic, without obvious abnormality, atraumatic  Eyes:  Mild conjunctival pallor, EOMs intact  Lungs:   Coarse lung sounds bilaterally, respirations unlabored  Heart:  Irregularly irregular, tachycardic  Abdomen:   Increased distention and firmness as compared to yesterday's exam, + guarding, bowel sounds sluggish but present in all 4 quadrants   Extremities: Extremities normal, atraumatic, no  edema  Pulses: 2+ and symmetric    Lab Results: Recent Labs    10/21/19 1607 10/21/19 2339 10/22/19 0415 10/23/19 0140  NA   < >  --  136 139  K   < >  --  3.2* 2.8*  CL   < >  --  101 102  CO2   < >  --  25 24  GLUCOSE   < >  --  140* 114*  BUN   < >  --  14 11  CREATININE   < >  --  0.70 0.79  CALCIUM   < >  --  8.3* 7.7*  MG  --  1.5* 1.8  --   PHOS  --  2.2* 2.1*  --    < > = values in this interval not displayed.   Recent Labs    10/22/19 0415 10/23/19 0140  AST 39 34  ALT 22 22  ALKPHOS 69 59  BILITOT 0.9 1.0  PROT 6.2* 5.4*  ALBUMIN 3.0* 2.6*   Recent Labs    10/22/19 0415 10/23/19 0140  WBC 25.1* 11.1*  NEUTROABS 23.3*  --   HGB 8.7* 7.8*  HCT 27.8*  24.7*  MCV 89.7 89.5  PLT 226 231   Recent Labs    10/22/19 0415 10/23/19 0140  LABPROT 28.3* 32.8*  INR 2.8* 3.3*   Assessment: Abnormal CT: most consistent with partial SBO -Nausea and dry heaving overnight, no emesis. -Worsening distention  Anemia: down-trending Hgb, today 7.8 (8.7 yesterday). Suspect hemodilution is playing a role, as no signs of GI bleeding.   Pneumonia: fever, cough.  A fib, on Coumadin  Plan: Repeat plain abdominal films today.  Continue NPO status.  Consider NGT if patient experiences emesis.  Supportive care with IVF.  Maintain Mg >2 and K+ >4  [facility] will follow.  Salley Slaughter PA-C 10/23/2019, 9:10 AM  Contact #  [phone]

## 2019-10-23 NOTE — Consult Note (Signed)
[name] [facility] 1941/02/07  [id number].    Requesting MD: Dr. Avon Gully Chief Complaint/Reason for Consult: SBO   HPI: [name] is a 79 y.o. male with a history of a flutter on coumadin (INR 3.3), seizure disorder, prior CVA, HTN, DM2 who presented to the hospital with generalized fatigue, increased cough and low-grade fever.  Had previously presented on 7/30 after syncopal episode with negative work-up.  Patient was found to have a right lower lobe community-acquired pneumonia and started on antibiotics.  During work-up for patient's fatigue, a DG abdomen 1 view was obtained that showed mildly dilated air-fluid loops of bowel throughout the abdomen with question of abdominal wall sign which could represent pneumoperitoneum.  A CT abdomen and pelvis was obtained as a result that showed moderately dilated gas and fluid-filled small bowel and colon with decompressed terminal ileum questionable for obstruction versus ileus.  Patient is alert and oriented x2 (name and location).  He is unable to report the year.  Patient reports that prior to admission and during patient has had no abdominal pain, nausea or vomiting.  He is continue to pass flatus.  No BM since admission.  He is unsure his last BM.  He denies any prior abdominal surgeries.  No prior history of SBO.  GI is currently following and recommending supportive care. There is no documented emesis however GI's note reports several episodes of nausea and dry heaving overnight.   ROS: Review of Systems  Constitutional: Positive for fever and malaise/fatigue.  Respiratory: Positive for cough. Negative for shortness of breath.   Cardiovascular: Negative for chest pain and leg swelling.  Gastrointestinal: Positive for constipation. Negative for abdominal pain, diarrhea, nausea and vomiting.  Genitourinary: Negative for dysuria.  Musculoskeletal: Negative for back pain.  Neurological: Positive for weakness (generalized).  Negative for focal weakness.  Psychiatric/Behavioral: Negative for substance abuse.  All other systems reviewed and are negative.   Family History  Problem Relation Age of Onset  . Hypertension Other     Past Medical History:  Diagnosis Date  . Diabetes mellitus without complication ([place])   . Hiatal hernia   . HTN (hypertension)    pt denies h/o HTN though it appears he was started on spironolactone during his hospitalization 3/13  . Pancreatitis   . Seizures ([place])   . Stroke ([place])   . Typical atrial flutter ([place]) 3/13    Past Surgical History:  Procedure Laterality Date  . ESOPHAGOGASTRODUODENOSCOPY N/A 11/30/2015   Procedure: ESOPHAGOGASTRODUODENOSCOPY (EGD);  Surgeon: Wonda Horner, MD;  Location: [facility] ENDOSCOPY;  Service: Endoscopy;  Laterality: N/A;  . ESOPHAGOGASTRODUODENOSCOPY N/A 09/11/2019   Procedure: ESOPHAGOGASTRODUODENOSCOPY (EGD);  Surgeon: Wonda Horner, MD;  Location: [facility] END[facility]COPY;  Service: Endoscopy;  Laterality: N/A;  . ESOPHAGOGASTRODUODENOSCOPY (EGD) WITH PROPOFOL N/A 07/23/2018   Procedure: ESOPHAGOGASTRODUODENOSCOPY (EGD) WITH PROPOFOL;  Surgeon: Otis Brace, MD;  Location: [facility] ENDOSCOPY;  Service: Gastroenterology;  Laterality: N/A;  . FEMUR IM NAIL Left 12/08/2016  . FEMUR IM NAIL Left 12/07/2016   Procedure: INTRAMEDULLARY (IM) NAIL FEMORAL;  Surgeon: Renette Butters, MD;  Location: [place];  Service: Orthopedics;  Laterality: Left;  . GIVENS CAPSULE STUDY N/A 07/24/2018   Procedure: GIVENS CAPSULE STUDY;  Surgeon: Otis Brace, MD;  Location: [place];  Service: Gastroenterology;  Laterality: N/A;  . HIP SURGERY Left 01/2017  . HOT HEMOSTASIS N/A 07/23/2018   Procedure: HOT HEMOSTASIS (ARGON PLASMA COAGULATION/BICAP);  Surgeon: Otis Brace, MD;  Location: [facility] ENDOSCOPY;  Service: Gastroenterology;  Laterality: N/A;  . HOT HEMOSTASIS N/A 09/11/2019   Procedure: HOT HEMOSTASIS (ARGON PLASMA COAGULATION/BICAP);  Surgeon: Wonda Horner, MD;   Location: [name] ENDOSCOPY;  Service: Endoscopy;  Laterality: N/A;  . no surgical history  06/2011    Social History:  reports that he quit smoking about 53 years ago. He has never used smokeless tobacco. He reports that he does not drink alcohol and does not use drugs.  Allergies: No Known Allergies  Medications Prior to Admission  Medication Sig Dispense Refill  . amiodarone (PACERONE) 200 MG tablet Take 1 tablet (200 mg total) by mouth daily. (Patient taking differently: Take 200 mg by mouth at bedtime. ) 30 tablet 3  . feeding supplement, ENSURE ENLIVE, (ENSURE ENLIVE) LIQD Take 237 mLs by mouth 3 (three) times daily between meals. (Patient taking differently: Take 237 mLs by mouth 3 (three) times daily as needed (for supplementation). ) 237 mL 12  . ferrous sulfate 325 (65 FE) MG tablet Take 1 tablet (325 mg total) by mouth 3 (three) times daily with meals. (Patient taking differently: Take 325 mg by mouth daily with breakfast. ) 60 tablet 0  . folic acid (FOLVITE) 1 MG tablet Take 1 tablet (1 mg total) by mouth daily. 90 tablet 1  . levETIRAcetam (KEPPRA) 500 MG tablet TAKE 3 TABLETS BY MOUTH TWICE A DAY (Patient taking differently: Take 1,500 mg by mouth in the morning and at bedtime. ) 540 tablet 0  . Multiple Vitamin (MULTIVITAMIN WITH MINERALS) TABS tablet Take 1 tablet by mouth daily.    . pantoprazole (PROTONIX) 40 MG tablet Take 1 tablet (40 mg total) by mouth daily at 6 (six) AM. 30 tablet 2  . phenytoin (DILANTIN) 100 MG ER capsule Take 2 capsules (200 mg total) by mouth at bedtime. 180 capsule 0  . polyethylene glycol (MIRALAX / GLYCOLAX) 17 g packet Take 17 g by mouth daily as needed for mild constipation. (Patient taking differently: Take 17 g by mouth daily as needed for mild constipation (MIX INTO WATER AS DIRECTED AND DRINK). ) 14 each 0  . warfarin (COUMADIN) 2.5 MG tablet TAKE AS DIRECTED BY COUMADIN CLINIC (Patient taking differently: Take 2.5-5 mg by mouth See admin  instructions. Take 5 mg by mouth at 6 PM in the evening on Sun/Tues/Wed/Thurs/Sat and 2.5 mg on Mon/Fri) 150 tablet 1     Physical Exam: Blood pressure (!) 107/54, pulse (!) 103, temperature 97.6 F (36.4 C), temperature source Oral, resp. rate 15, height 6' (1.829 m), weight 55 kg, SpO2 100 %. General: pleasant, elderly WD/WN AA male who is laying in bed in NAD HEENT: head is normocephalic, atraumatic.  Sclera are noninjected.  PERRL.  Ears and nose without any masses or lesions.  Mouth is pink and moist. Dentition fair Heart: Irregular rate with irregular rhythm.  No obvious murmurs.  Palpable pedal pulses bilaterally  Lungs: CTAB, no wheezes, rhonchi, or rales noted.  Respiratory effort nonlabored Abd: Soft, NT/ND, +BS, there is a supraumbilical hernia that is reducible, no masses, or organomegaly MS: Right calf with bruising to the posterior calf. No tenderness. Calves appear equal in size. No LE edema.  Skin: warm and dry with no masses, lesions, or rashes Psych: A&Ox2 (self, and place) with an appropriate affect Neuro: cranial nerves grossly intact, equal strength in BUE/BLE bilaterally, normal speech, though process intact  Results for orders placed or performed during the hospital encounter of 10/21/19 (from the past 48 hour(s))  Basic metabolic panel  Status: Abnormal   Collection Time: 10/21/19  4:07 PM  Result Value Ref Range   Sodium 135 135 - 145 mmol/L   Potassium 3.4 (L) 3.5 - 5.1 mmol/L   Chloride 99 98 - 111 mmol/L   CO2 24 22 - 32 mmol/L   Glucose, Bld 239 (H) 70 - 99 mg/dL    Comment: Glucose reference range applies only to samples taken after fasting for at least 8 hours.   BUN 14 8 - 23 mg/dL   Creatinine, Ser 0.86 0.61 - 1.24 mg/dL   Calcium 8.9 8.9 - 10.3 mg/dL   GFR calc non Af Amer >60 >60 mL/min   GFR calc Af Amer >60 >60 mL/min   Anion gap 12 5 - 15    Comment: Performed at [facility] [address], [place], [place] [address]  CBC     Status:  Abnormal   Collection Time: 10/21/19  4:07 PM  Result Value Ref Range   WBC 18.1 (H) 4.0 - 10.5 K/uL   RBC 3.33 (L) 4.22 - 5.81 MIL/uL   Hemoglobin 9.3 (L) 13.0 - 17.0 g/dL   HCT 30.5 (L) 39 - 52 %   MCV 91.6 80.0 - 100.0 fL   MCH 27.9 26.0 - 34.0 pg   MCHC 30.5 30.0 - 36.0 g/dL   RDW 20.3 (H) 11.5 - 15.5 %   Platelets 316 150 - 400 K/uL   nRBC 0.2 0.0 - 0.2 %    Comment: Performed at [facility] [address] [place], [place] [address]  Lactic acid, plasma     Status: Abnormal   Collection Time: 10/21/19  4:07 PM  Result Value Ref Range   Lactic Acid, Venous 3.2 (HH) 0.5 - 1.9 mmol/L    Comment: CRITICAL RESULT CALLED TO, READ BACK BY AND VERIFIED WITH: RN T PHILLIPS AT 1652 10/21/19 BY L BENFIELD Performed at [facility], [place] [address] [place], [place] [address]   Protime-INR     Status: Abnormal   Collection Time: 10/21/19  5:42 PM  Result Value Ref Range   Prothrombin Time 25.3 (H) 11.4 - 15.2 seconds   INR 2.4 (H) 0.8 - 1.2    Comment: (NOTE) INR goal varies based on device and disease states. Performed at [facility], [place] [address] [place], [place] [address]   Hepatic function panel     Status: Abnormal   Collection Time: 10/21/19  5:42 PM  Result Value Ref Range   Total Protein 7.4 6.5 - 8.1 g/dL   Albumin 3.6 3.5 - 5.0 g/dL   AST 46 (H) 15 - 41 U/L   ALT 23 0 - 44 U/L   Alkaline Phosphatase 77 38 - 126 U/L   Total Bilirubin 0.7 0.3 - 1.2 mg/dL   Bilirubin, Direct 0.2 0.0 - 0.2 mg/dL   Indirect Bilirubin 0.5 0.3 - 0.9 mg/dL    Comment: Performed at [facility] [address], [place], [place] [address]  Troponin I (High Sensitivity)     Status: Abnormal   Collection Time: 10/21/19  5:42 PM  Result Value Ref Range   Troponin I (High Sensitivity) 18 (H) <18 ng/L    Comment: (NOTE) Elevated high sensitivity troponin I (hsTnI) values and significant  changes across serial measurements may suggest ACS but many other  chronic and  acute conditions are known to elevate hsTnI results.  Refer to the "Links" section for chest pain algorithms and  additional  guidance. Performed at [facility], [place] [address], [place], [place] [address]   Brain natriuretic peptide     Status: Abnormal   Collection Time: 10/21/19  5:42 PM  Result Value Ref Range   B Natriuretic Peptide 391.1 (H) 0.0 - 100.0 pg/mL    Comment: Performed at [facility] [address], [place], [place] [address]  Lactic acid, plasma     Status: Abnormal   Collection Time: 10/21/19  5:43 PM  Result Value Ref Range   Lactic Acid, Venous 3.1 (HH) 0.5 - 1.9 mmol/L    Comment: CRITICAL VALUE NOTED.  VALUE IS CONSISTENT WITH PREVIOUSLY REPORTED AND CALLED VALUE. Performed at [facility], [place] [address] [place], [place] [address]   Blood culture (routine x 2)     Status: None (Preliminary result)   Collection Time: 10/21/19  5:43 PM   Specimen: BLOOD  Result Value Ref Range   Specimen Description BLOOD SITE NOT SPECIFIED    Special Requests      BOTTLES DRAWN AEROBIC AND ANAEROBIC Blood Culture adequate volume   Culture      NO GROWTH < 24 HOURS Performed at [facility], [place] [address], [place], [place] [address]    Report Status PENDING   Blood culture (routine x 2)     Status: None (Preliminary result)   Collection Time: 10/21/19  6:11 PM   Specimen: BLOOD LEFT FOREARM  Result Value Ref Range   Specimen Description BLOOD LEFT FOREARM    Special Requests      BOTTLES DRAWN AEROBIC AND ANAEROBIC Blood Culture results may not be optimal due to an inadequate volume of blood received in culture bottles   Culture      NO GROWTH < 24 HOURS Performed at [facility], [place] [address], [place], [place] [address]    Report Status PENDING   Troponin I (High Sensitivity)     Status: Abnormal   Collection Time: 10/21/19  7:50 PM  Result Value Ref Range   Troponin I (High Sensitivity) 22 (H) <18 ng/L    Comment:  (NOTE) Elevated high sensitivity troponin I (hsTnI) values and significant  changes across serial measurements may suggest ACS but many other  chronic and acute conditions are known to elevate hsTnI results.  Refer to the "Links" section for chest pain algorithms and additional  guidance. Performed at [facility], [place] [address] [place], [place] [address]   Strep pneumoniae urinary antigen     Status: None   Collection Time: 10/21/19  8:49 PM  Result Value Ref Range   Strep Pneumo Urinary Antigen NEGATIVE NEGATIVE    Comment:        Infection due to S. pneumoniae cannot be absolutely ruled out since the antigen present may be below the detection limit of the test. Performed at [facility], [place] [address], [place], [place] [address]   Urinalysis, Routine w reflex microscopic     Status: Abnormal   Collection Time: 10/21/19  8:50 PM  Result Value Ref Range   Color, Urine YELLOW YELLOW   APPearance HAZY (A) CLEAR   Specific Gravity, Urine 1.028 1.005 - 1.030   pH 5.0 5.0 - 8.0   Glucose, UA >=500 (A) NEGATIVE mg/dL   Hgb urine dipstick SMALL (A) NEGATIVE   Bilirubin Urine NEGATIVE NEGATIVE   Ketones, ur NEGATIVE NEGATIVE mg/dL   Protein, ur 100 (A) NEGATIVE mg/dL  Nitrite NEGATIVE NEGATIVE   Leukocytes,Ua NEGATIVE NEGATIVE   RBC / HPF 6-10 0 - 5 RBC/hpf   WBC, UA 0-5 0 - 5 WBC/hpf   Bacteria, UA NONE SEEN NONE SEEN   Mucus PRESENT    Hyaline Casts, UA PRESENT     Comment: Performed at [facility] [address] [place], [place] [address]  Urine culture     Status: Abnormal   Collection Time: 10/21/19  8:50 PM   Specimen: Urine, Clean Catch  Result Value Ref Range   Specimen Description URINE, CLEAN CATCH    Special Requests NONE    Culture (A)     <10,000 COLONIES/mL INSIGNIFICANT GROWTH Performed at [facility], [place] [address] [place], [place] [address]    Report Status 10/22/2019 FINAL   CBG monitoring, ED     Status: Abnormal    Collection Time: 10/21/19  9:09 PM  Result Value Ref Range   Glucose-Capillary 152 (H) 70 - 99 mg/dL    Comment: Glucose reference range applies only to samples taken after fasting for at least 8 hours.  Respiratory Panel by PCR     Status: None   Collection Time: 10/21/19  9:27 PM   Specimen: Nasopharyngeal Swab; Respiratory  Result Value Ref Range   Adenovirus NOT DETECTED NOT DETECTED   Coronavirus 229E NOT DETECTED NOT DETECTED    Comment: (NOTE) The Coronavirus on the Respiratory Panel, DOES NOT test for the novel  Coronavirus (2019 nCoV)    Coronavirus HKU1 NOT DETECTED NOT DETECTED   Coronavirus NL63 NOT DETECTED NOT DETECTED   Coronavirus OC43 NOT DETECTED NOT DETECTED   Metapneumovirus NOT DETECTED NOT DETECTED   Rhinovirus / Enterovirus NOT DETECTED NOT DETECTED   Influenza A NOT DETECTED NOT DETECTED   Influenza B NOT DETECTED NOT DETECTED   Parainfluenza Virus 1 NOT DETECTED NOT DETECTED   Parainfluenza Virus 2 NOT DETECTED NOT DETECTED   Parainfluenza Virus 3 NOT DETECTED NOT DETECTED   Parainfluenza Virus 4 NOT DETECTED NOT DETECTED   Respiratory Syncytial Virus NOT DETECTED NOT DETECTED   Bordetella pertussis NOT DETECTED NOT DETECTED   Chlamydophila pneumoniae NOT DETECTED NOT DETECTED   Mycoplasma pneumoniae NOT DETECTED NOT DETECTED    Comment: Performed at [facility], [place] [address], [place], [place] [address]  Phenytoin level, total     Status: None   Collection Time: 10/21/19 11:39 PM  Result Value Ref Range   Phenytoin Lvl 12.3 10.0 - 20.0 ug/mL    Comment: Performed at [facility] [address], [place], [place] [address]  Hemoglobin A1c     Status: Abnormal   Collection Time: 10/21/19 11:39 PM  Result Value Ref Range   Hgb A1c MFr Bld 4.7 (L) 4.8 - 5.6 %    Comment: (NOTE) Pre diabetes:          5.7%-6.4%  Diabetes:              >6.4%  Glycemic control for   <7.0% adults with diabetes    Mean Plasma Glucose 88.19 mg/dL     Comment: Performed at [facility] [address], [place], [place] [address]  HIV Antibody (routine testing w rflx)     Status: None   Collection Time: 10/21/19 11:39 PM  Result Value Ref Range   HIV Screen 4th Generation wRfx Non Reactive Non Reactive    Comment: Performed at [facility], [place] [address],  [place], [place] [address]  Procalcitonin - Baseline     Status: None   Collection Time: 10/21/19 11:39 PM  Result Value Ref Range   Procalcitonin 1.97 ng/mL    Comment:        Interpretation: PCT > 0.5 ng/mL and <= 2 ng/mL: Systemic infection (sepsis) is possible, but other conditions are known to elevate PCT as well. (NOTE)       Sepsis PCT Algorithm           Lower Respiratory Tract                                      Infection PCT Algorithm    ----------------------------     ----------------------------         PCT < 0.25 ng/mL                PCT < 0.10 ng/mL          Strongly encourage             Strongly discourage   discontinuation of antibiotics    initiation of antibiotics    ----------------------------     -----------------------------       PCT 0.25 - 0.50 ng/mL            PCT 0.10 - 0.25 ng/mL               OR       >80% decrease in PCT            Discourage initiation of                                            antibiotics      Encourage discontinuation           of antibiotics    ----------------------------     -----------------------------         PCT >= 0.50 ng/mL              PCT 0.26 - 0.50 ng/mL                AND       <80% decrease in PCT             Encourage initiation of                                             antibiotics       Encourage continuation           of antibiotics    ----------------------------     -----------------------------        PCT >= 0.50 ng/mL                  PCT > 0.50 ng/mL               AND         increase in PCT                  Strongly encourage  initiation of  antibiotics    Strongly encourage escalation           of antibiotics                                     -----------------------------                                           PCT <= 0.25 ng/mL                                                 OR                                        > 80% decrease in PCT                                      Discontinue / Do not initiate                                             antibiotics  Performed at [facility], [address], [place], [place] [address]   Lactic acid, plasma     Status: Abnormal   Collection Time: 10/21/19 11:39 PM  Result Value Ref Range   Lactic Acid, Venous 2.1 (HH) 0.5 - 1.9 mmol/L    Comment: CRITICAL VALUE NOTED.  VALUE IS CONSISTENT WITH PREVIOUSLY REPORTED AND CALLED VALUE. Performed at [facility], [place] [address], [place], [place] [address]   Ethanol     Status: None   Collection Time: 10/21/19 11:39 PM  Result Value Ref Range   Alcohol, Ethyl (B) <10 <10 mg/dL    Comment: (NOTE) Lowest detectable limit for serum alcohol is 10 mg/dL.  For medical purposes only. Performed at [facility], [place] [address], [place], [place] [address]   Magnesium     Status: Abnormal   Collection Time: 10/21/19 11:39 PM  Result Value Ref Range   Magnesium 1.5 (L) 1.7 - 2.4 mg/dL    Comment: Performed at [facility] [address] [place], [place] [address]  Phosphorus     Status: Abnormal   Collection Time: 10/21/19 11:39 PM  Result Value Ref Range   Phosphorus 2.2 (L) 2.5 - 4.6 mg/dL    Comment: Performed at [facility] [address] [place], [place] [address]  TSH     Status: None   Collection Time: 10/21/19 11:41 PM  Result Value Ref Range   TSH 0.781 0.350 - 4.500 uIU/mL    Comment: Performed by a 3rd Generation assay with a functional sensitivity of <=0.01 uIU/mL. Performed at [facility], [place] [address], [place], [place] [address]   Culture, sputum-assessment     Status:  None   Collection Time: 10/21/19 11:44 PM   Specimen: Sputum  Result Value  Ref Range   Specimen Description SPUTUM    Special Requests COLLECTED IN THE ER    Sputum evaluation      THIS SPECIMEN IS ACCEPTABLE FOR SPUTUM CULTURE Performed at [facility], [place] [address], [place], [place] [address]    Report Status 10/22/2019 FINAL   Culture, respiratory     Status: None (Preliminary result)   Collection Time: 10/21/19 11:44 PM   Specimen: SPU  Result Value Ref Range   Specimen Description SPUTUM    Special Requests COLLECTED IN THE ER Reflexed from [id number]    Gram Stain      RARE WBC PRESENT,BOTH PMN AND MONONUCLEAR FEW GRAM POSITIVE COCCI IN PAIRS FEW GRAM VARIABLE ROD RARE BUDDING YEAST SEEN Performed at [facility], [place] [address] [place], [place] [address]    Culture PENDING    Report Status PENDING   CBG monitoring, ED     Status: Abnormal   Collection Time: 10/22/19 12:23 AM  Result Value Ref Range   Glucose-Capillary 149 (H) 70 - 99 mg/dL    Comment: Glucose reference range applies only to samples taken after fasting for at least 8 hours.  MRSA PCR Screening     Status: None   Collection Time: 10/22/19 12:43 AM   Specimen: Nasal Mucosa; Nasopharyngeal  Result Value Ref Range   MRSA by PCR NEGATIVE NEGATIVE    Comment:        The GeneXpert MRSA Assay (FDA approved for NASAL specimens only), is one component of a comprehensive MRSA colonization surveillance program. It is not intended to diagnose MRSA infection nor to guide or monitor treatment for MRSA infections. Performed at [facility], [place] [address], [place], [place] [address]   Procalcitonin     Status: None   Collection Time: 10/22/19  4:15 AM  Result Value Ref Range   Procalcitonin 1.75 ng/mL    Comment:        Interpretation: PCT > 0.5 ng/mL and <= 2 ng/mL: Systemic infection (sepsis) is possible, but other conditions are known to elevate PCT as well. (NOTE)       Sepsis PCT  Algorithm           Lower Respiratory Tract                                      Infection PCT Algorithm    ----------------------------     ----------------------------         PCT < 0.25 ng/mL                PCT < 0.10 ng/mL          Strongly encourage             Strongly discourage   discontinuation of antibiotics    initiation of antibiotics    ----------------------------     -----------------------------       PCT 0.25 - 0.50 ng/mL            PCT 0.10 - 0.25 ng/mL               OR       >80% decrease in PCT            Discourage initiation of  antibiotics      Encourage discontinuation           of antibiotics    ----------------------------     -----------------------------         PCT >= 0.50 ng/mL              PCT 0.26 - 0.50 ng/mL                AND       <80% decrease in PCT             Encourage initiation of                                             antibiotics       Encourage continuation           of antibiotics    ----------------------------     -----------------------------        PCT >= 0.50 ng/mL                  PCT > 0.50 ng/mL               AND         increase in PCT                  Strongly encourage                                      initiation of antibiotics    Strongly encourage escalation           of antibiotics                                     -----------------------------                                           PCT <= 0.25 ng/mL                                                 OR                                        > 80% decrease in PCT                                      Discontinue / Do not initiate                                             antibiotics  Performed at [facility], [place] [address] [place], [place] [address]   Protime-INR     Status: Abnormal   Collection Time: 10/22/19  4:15 AM  Result Value Ref Range   Prothrombin Time 28.3 (H) 11.4 - 15.2 seconds   INR 2.8 (H)  0.8 - 1.2    Comment: (NOTE) INR goal varies based on device and disease states. Performed at [facility], [place] [address] [place], [place] [address]   Magnesium     Status: None   Collection Time: 10/22/19  4:15 AM  Result Value Ref Range   Magnesium 1.8 1.7 - 2.4 mg/dL    Comment: Performed at [facility] [address], [place], [place] [address]  Phosphorus     Status: Abnormal   Collection Time: 10/22/19  4:15 AM  Result Value Ref Range   Phosphorus 2.1 (L) 2.5 - 4.6 mg/dL    Comment: Performed at [facility] [address], [place], [place] [address]  CBC WITH DIFFERENTIAL     Status: Abnormal   Collection Time: 10/22/19  4:15 AM  Result Value Ref Range   WBC 25.1 (H) 4.0 - 10.5 K/uL   RBC 3.10 (L) 4.22 - 5.81 MIL/uL   Hemoglobin 8.7 (L) 13.0 - 17.0 g/dL   HCT 27.8 (L) 39 - 52 %   MCV 89.7 80.0 - 100.0 fL   MCH 28.1 26.0 - 34.0 pg   MCHC 31.3 30.0 - 36.0 g/dL   RDW 20.2 (H) 11.5 - 15.5 %   Platelets 226 150 - 400 K/uL   nRBC 0.0 0.0 - 0.2 %   Neutrophils Relative % 93 %   Neutro Abs 23.3 (H) 1.7 - 7.7 K/uL   Lymphocytes Relative 2 %   Lymphs Abs 0.5 (L) 0.7 - 4.0 K/uL   Monocytes Relative 4 %   Monocytes Absolute 1.1 (H) 0 - 1 K/uL   Eosinophils Relative 0 %   Eosinophils Absolute 0.0 0 - 0 K/uL   Basophils Relative 0 %   Basophils Absolute 0.1 0 - 0 K/uL   WBC Morphology INCREASED BANDS (>20% BANDS)     Comment: VACUOLATED NEUTROPHILS   Immature Granulocytes 1 %   Abs Immature Granulocytes 0.12 (H) 0.00 - 0.07 K/uL   Polychromasia PRESENT     Comment: Performed at [facility], [place] [address], [place], [place] [address]  Comprehensive metabolic panel     Status: Abnormal   Collection Time: 10/22/19  4:15 AM  Result Value Ref Range   Sodium 136 135 - 145 mmol/L   Potassium 3.2 (L) 3.5 - 5.1 mmol/L   Chloride 101 98 - 111 mmol/L   CO2 25 22 - 32 mmol/L   Glucose, Bld 140 (H) 70 - 99 mg/dL    Comment: Glucose reference range  applies only to samples taken after fasting for at least 8 hours.   BUN 14 8 - 23 mg/dL   Creatinine, Ser 0.70 0.61 - 1.24 mg/dL   Calcium 8.3 (L) 8.9 - 10.3 mg/dL   Total Protein 6.2 (L) 6.5 - 8.1 g/dL   Albumin 3.0 (L) 3.5 - 5.0 g/dL   AST 39 15 - 41 U/L   ALT 22 0 - 44 U/L   Alkaline Phosphatase 69 38 - 126 U/L   Total Bilirubin 0.9 0.3 - 1.2 mg/dL   GFR calc non Af Amer >60 >60 mL/min   GFR calc Af Amer >60 >60 mL/min   Anion gap 10 5 - 15    Comment: Performed at [facility], [place] [address] [place], [place] [address]  CBG monitoring,  ED     Status: Abnormal   Collection Time: 10/22/19  5:24 AM  Result Value Ref Range   Glucose-Capillary 137 (H) 70 - 99 mg/dL    Comment: Glucose reference range applies only to samples taken after fasting for at least 8 hours.  CBG monitoring, ED     Status: Abnormal   Collection Time: 10/22/19  8:08 AM  Result Value Ref Range   Glucose-Capillary 154 (H) 70 - 99 mg/dL    Comment: Glucose reference range applies only to samples taken after fasting for at least 8 hours.   Comment 1 Notify RN    Comment 2 Document in Chart   CBG monitoring, ED     Status: None   Collection Time: 10/22/19 11:53 AM  Result Value Ref Range   Glucose-Capillary 91 70 - 99 mg/dL    Comment: Glucose reference range applies only to samples taken after fasting for at least 8 hours.  CBG monitoring, ED     Status: Abnormal   Collection Time: 10/22/19  4:06 PM  Result Value Ref Range   Glucose-Capillary 102 (H) 70 - 99 mg/dL    Comment: Glucose reference range applies only to samples taken after fasting for at least 8 hours.   Comment 1 Notify RN    Comment 2 Document in Chart   Glucose, capillary     Status: Abnormal   Collection Time: 10/22/19 11:05 PM  Result Value Ref Range   Glucose-Capillary 128 (H) 70 - 99 mg/dL    Comment: Glucose reference range applies only to samples taken after fasting for at least 8 hours.  Procalcitonin     Status: None    Collection Time: 10/23/19  1:40 AM  Result Value Ref Range   Procalcitonin 1.30 ng/mL    Comment:        Interpretation: PCT > 0.5 ng/mL and <= 2 ng/mL: Systemic infection (sepsis) is possible, but other conditions are known to elevate PCT as well. (NOTE)       Sepsis PCT Algorithm           Lower Respiratory Tract                                      Infection PCT Algorithm    ----------------------------     ----------------------------         PCT < 0.25 ng/mL                PCT < 0.10 ng/mL          Strongly encourage             Strongly discourage   discontinuation of antibiotics    initiation of antibiotics    ----------------------------     -----------------------------       PCT 0.25 - 0.50 ng/mL            PCT 0.10 - 0.25 ng/mL               OR       >80% decrease in PCT            Discourage initiation of                                            antibiotics  Encourage discontinuation           of antibiotics    ----------------------------     -----------------------------         PCT >= 0.50 ng/mL              PCT 0.26 - 0.50 ng/mL                AND       <80% decrease in PCT             Encourage initiation of                                             antibiotics       Encourage continuation           of antibiotics    ----------------------------     -----------------------------        PCT >= 0.50 ng/mL                  PCT > 0.50 ng/mL               AND         increase in PCT                  Strongly encourage                                      initiation of antibiotics    Strongly encourage escalation           of antibiotics                                     -----------------------------                                           PCT <= 0.25 ng/mL                                                 OR                                        > 80% decrease in PCT                                      Discontinue / Do not initiate                                              antibiotics  Performed at [facility], [place] [address], [place], [place] [address]   Protime-INR     Status: Abnormal   Collection Time: 10/23/19  1:40 AM  Result  Value Ref Range   Prothrombin Time 32.8 (H) 11.4 - 15.2 seconds   INR 3.3 (H) 0.8 - 1.2    Comment: (NOTE) INR goal varies based on device and disease states. Performed at [facility], [place] [address], [place], [place] [address]   CBC     Status: Abnormal   Collection Time: 10/23/19  1:40 AM  Result Value Ref Range   WBC 11.1 (H) 4.0 - 10.5 K/uL   RBC 2.76 (L) 4.22 - 5.81 MIL/uL   Hemoglobin 7.8 (L) 13.0 - 17.0 g/dL   HCT 24.7 (L) 39 - 52 %   MCV 89.5 80.0 - 100.0 fL   MCH 28.3 26.0 - 34.0 pg   MCHC 31.6 30.0 - 36.0 g/dL   RDW 20.3 (H) 11.5 - 15.5 %   Platelets 231 150 - 400 K/uL   nRBC 0.0 0.0 - 0.2 %    Comment: Performed at [facility], [place] [address], [place], [place] [address]  Comprehensive metabolic panel     Status: Abnormal   Collection Time: 10/23/19  1:40 AM  Result Value Ref Range   Sodium 139 135 - 145 mmol/L   Potassium 2.8 (L) 3.5 - 5.1 mmol/L   Chloride 102 98 - 111 mmol/L   CO2 24 22 - 32 mmol/L   Glucose, Bld 114 (H) 70 - 99 mg/dL    Comment: Glucose reference range applies only to samples taken after fasting for at least 8 hours.   BUN 11 8 - 23 mg/dL   Creatinine, Ser 0.79 0.61 - 1.24 mg/dL   Calcium 7.7 (L) 8.9 - 10.3 mg/dL   Total Protein 5.4 (L) 6.5 - 8.1 g/dL   Albumin 2.6 (L) 3.5 - 5.0 g/dL   AST 34 15 - 41 U/L   ALT 22 0 - 44 U/L   Alkaline Phosphatase 59 38 - 126 U/L   Total Bilirubin 1.0 0.3 - 1.2 mg/dL   GFR calc non Af Amer >60 >60 mL/min   GFR calc Af Amer >60 >60 mL/min   Anion gap 13 5 - 15    Comment: Performed at [facility] [address] [place], [place] [address]  Glucose, capillary     Status: Abnormal   Collection Time: 10/23/19  3:05 AM  Result Value Ref Range   Glucose-Capillary 110 (H) 70 - 99 mg/dL     Comment: Glucose reference range applies only to samples taken after fasting for at least 8 hours.  Glucose, capillary     Status: Abnormal   Collection Time: 10/23/19  3:05 AM  Result Value Ref Range   Glucose-Capillary 110 (H) 70 - 99 mg/dL    Comment: Glucose reference range applies only to samples taken after fasting for at least 8 hours.  Glucose, capillary     Status: Abnormal   Collection Time: 10/23/19  7:38 AM  Result Value Ref Range   Glucose-Capillary 111 (H) 70 - 99 mg/dL    Comment: Glucose reference range applies only to samples taken after fasting for at least 8 hours.  Glucose, capillary     Status: Abnormal   Collection Time: 10/23/19  7:38 AM  Result Value Ref Range   Glucose-Capillary 111 (H) 70 - 99 mg/dL    Comment: Glucose reference range applies only to samples taken after fasting for at least 8 hours.   DG Chest 2 View  Result Date: 10/21/2019 CLINICAL DATA:  Cough, chest pain, weakness EXAM: CHEST -  2 VIEW COMPARISON:  Chest x-rays dated 10/19/2019 and 09/09/2019. FINDINGS: Heart size and mediastinal contours are stable. Lungs are hyperexpanded. Retrocardiac opacity, pneumonia versus atelectasis. No pleural effusion or pneumothorax is seen. No acute appearing osseous abnormality. IMPRESSION: 1. Retrocardiac opacity, pneumonia versus atelectasis. 2. Hyperexpanded lungs suggesting COPD. Electronically Signed   By: Franki Cabot [name]   On: 10/21/2019 16:41   DG Abd 1 View  Result Date: 10/21/2019 CLINICAL DATA:  Abdominal distension EXAM: ABDOMEN - 1 VIEW COMPARISON:  None. FINDINGS: Diffusely mildly dilated air-filled loops of bowel are seen throughout the abdomen. There is question of a double wall sign seen within the mid abdomen which could represent a small amount of pneumoperitoneum. Degenerative changes in the lumbar spine and bilateral hips. IMPRESSION: Mildly dilated air-filled loops of bowel throughout the abdomen with question of double wall sign which could  represent pneumoperitoneum. If further evaluation is required would recommend lateral decubitus film or cross-sectional imaging. Electronically Signed   By: Prudencio Pair [name]   On: 10/21/2019 22:14   CT ABDOMEN PELVIS W CONTRAST  Result Date: 10/21/2019 CLINICAL DATA:  Free air seen on x-ray. Peritonitis or perforation suspected. History of pancreatitis and hiatal hernia. EXAM: CT ABDOMEN AND PELVIS WITH CONTRAST TECHNIQUE: Multidetector CT imaging of the abdomen and pelvis was performed using the standard protocol following bolus administration of intravenous contrast. CONTRAST:  180mL OMNIPAQUE IOHEXOL 300 MG/ML  SOLN COMPARISON:  Abdomen 10/21/2019.  CT abdomen and pelvis 07/21/2019 FINDINGS: Lower chest: Airspace consolidation in both lung bases. Small pericardial effusion. Cardiac enlargement. Hepatobiliary: Scattered calcifications throughout the liver, likely granulomas. No focal lesions. Portal veins are patent. Gallbladder is decompressed. No bile duct dilatation. Pancreas: Atrophic.  No mass or ductal dilatation appreciated. Spleen: Multiple calcifications, likely granulomas. Adrenals/Urinary Tract: No adrenal gland nodules. Kidneys are symmetrical. No renal mass lesion or hydronephrosis. No hydroureter. Bladder is normal. Stomach/Bowel: Moderately dilated gas and fluid-filled small bowel. Portions of the colon are also dilated. No wall thickening or mass appreciated. Terminal ileum is decompressed. Changes likely represent obstruction although ileus may also be present. No free air. Vascular/Lymphatic: Calcific aortic atherosclerosis. No aortic aneurysm. No significant lymphadenopathy. Reproductive: Prostate is unremarkable. Other: No abdominal wall hernia or abnormality. No abdominopelvic ascites. Musculoskeletal: Internal fixation of the left hip. Heterotopic bone formation. Degenerative changes in the spine and hips. Ankylosis of the thoracolumbar spine. IMPRESSION: 1. Moderately dilated gas and  fluid-filled small bowel and colon with decompressed terminal ileum. Changes likely represent obstruction although ileus may also be present. No wall thickening or mass appreciated. 2. Airspace consolidation in both lung bases may represent pneumonia or aspiration. 3. Cardiac enlargement with small pericardial effusion. 4. Aortic atherosclerosis. 5. Calcified granulomas in the liver and spleen. 6. Internal fixation of the left hip. Heterotopic bone formation. Aortic Atherosclerosis (ICD10-I70.0). Electronically Signed   By: Lucienne Capers [name]   On: 10/21/2019 23:42   DG Abd 2 Views  Result Date: 10/23/2019 CLINICAL DATA:  Small-bowel obstruction. EXAM: ABDOMEN - 2 VIEW COMPARISON:  A 03/2019 FINDINGS: Dilated large and small bowel loops appear unchanged. Lumbar spondylosis.  Left hip fracture repair. Bibasilar airspace disease.  Small left effusion. IMPRESSION: Dilated large and small bowel loops unchanged. Findings most compatible with ileus versus obstruction. Electronically Signed   By: Franchot Gallo [name]   On: 10/23/2019 10:29   ECHOCARDIOGRAM COMPLETE  Result Date: 10/22/2019    ECHOCARDIOGRAM REPORT   Patient Name:   [name] Date of Exam: 10/22/2019 Medical  Rec #:  [id number]            Height:       72.0 in Accession #:    [id number]           Weight:       121.3 lb Date of Birth:  Mar 06, 1941             BSA:          1.722 m Patient Age:    72 years             BP:           101/60 mmHg Patient Gender: M                    HR:           119 bpm. Exam Location:  Inpatient Procedure: 2D Echo Indications:    780.2 syncope  History:        Patient has prior history of Echocardiogram examinations, most                 recent 07/24/2019. Stroke, Arrythmias:Atrial Flutter; Risk                 Factors:Diabetes, Hypertension and Former Smoker.  Sonographer:    Jannett Celestine RDCS (AE) Referring Phys: [id number] ANASTASSIA DOUTOVA  Sonographer Comments: Image acquisition challenging due to respiratory  motion. IMPRESSIONS  1. Left ventricular ejection fraction, by estimation, is 65 to 70%. The left ventricle has normal function. Left ventricular endocardial border not optimally defined to evaluate regional wall motion. There is mild left ventricular hypertrophy. Left ventricular diastolic parameters are indeterminate.  2. Right ventricular systolic function is normal. The right ventricular size is normal. Tricuspid regurgitation signal is inadequate for assessing PA pressure.  3. Right atrial size was mildly dilated.  4. The mitral valve is grossly normal. Trivial mitral valve regurgitation.  5. The aortic valve was not well visualized. Aortic valve regurgitation is not visualized. No aortic stenosis is present.  6. The inferior vena cava is dilated in size with >50% respiratory variability, suggesting right atrial pressure of 8 mmHg. FINDINGS  Left Ventricle: Left ventricular ejection fraction, by estimation, is 65 to 70%. The left ventricle has normal function. Left ventricular endocardial border not optimally defined to evaluate regional wall motion. The left ventricular internal cavity size was normal in size. There is mild left ventricular hypertrophy. Left ventricular diastolic parameters are indeterminate. Right Ventricle: The right ventricular size is normal. No increase in right ventricular wall thickness. Right ventricular systolic function is normal. Tricuspid regurgitation signal is inadequate for assessing PA pressure. Left Atrium: Left atrial size was normal in size. Right Atrium: Right atrial size was mildly dilated. Pericardium: Trivial pericardial effusion is present. The pericardial effusion is surrounding the apex. Mitral Valve: The mitral valve is grossly normal. Trivial mitral valve regurgitation. Tricuspid Valve: The tricuspid valve is grossly normal. Tricuspid valve regurgitation is trivial. Aortic Valve: The aortic valve was not well visualized. Aortic valve regurgitation is not visualized.  No aortic stenosis is present. Pulmonic Valve: The pulmonic valve was not well visualized. Pulmonic valve regurgitation is not visualized. Aorta: The ascending aorta was not well visualized. Venous: The inferior vena cava was not well visualized. The inferior vena cava is dilated in size with greater than 50% respiratory variability, suggesting right atrial pressure of 8 mmHg. IAS/Shunts: The interatrial septum was not well visualized.  LEFT VENTRICLE PLAX 2D LVIDd:  2.60 cm LVIDs:         2.00 cm LV PW:         1.20 cm LV IVS:        1.20 cm LVOT diam:     2.20 cm LV SV:         50 LV SV Index:   29 LVOT Area:     3.80 cm  LEFT ATRIUM           Index       RIGHT ATRIUM           Index LA diam:      3.10 cm 1.80 cm/m  RA Area:     14.90 cm LA Vol (A2C): 33.1 ml 19.22 ml/m RA Volume:   39.20 ml  22.76 ml/m  AORTIC VALVE LVOT Vmax:   110.00 cm/s LVOT Vmean:  68.700 cm/s LVOT VTI:    0.132 m  AORTA Ao Root diam: 3.30 cm  SHUNTS Systemic VTI:  0.13 m Systemic Diam: 2.20 cm Brent Kaiser MD Electronically signed by Brent Kaiser MD Signature Date/Time: 10/22/2019/6:14:52 PM    Final    Anti-infectives (From admission, onward)   Start     Dose/Rate Route Frequency Ordered Stop   10/22/19 2000  Ampicillin-Sulbactam (UNASYN) 3 g in sodium chloride 0.9 % 100 mL IVPB     Discontinue     3 g 200 mL/hr over 30 Minutes Intravenous Every 8 hours 10/22/19 1849     10/22/19 0200  cefTRIAXone (ROCEPHIN) 2 g in sodium chloride 0.9 % 100 mL IVPB  Status:  Discontinued        2 g 200 mL/hr over 30 Minutes Intravenous Every 24 hours 10/21/19 2044 10/22/19 0010   10/22/19 0030  Ampicillin-Sulbactam (UNASYN) 3 g in sodium chloride 0.9 % 100 mL IVPB  Status:  Discontinued        3 g 200 mL/hr over 30 Minutes Intravenous Every 8 hours 10/22/19 0023 10/22/19 1849   10/21/19 2200  doxycycline (VIBRAMYCIN) 100 mg in sodium chloride 0.9 % 250 mL IVPB     Discontinue     100 mg 125 mL/hr over 120 Minutes Intravenous  Every 12 hours 10/21/19 2044     10/21/19 1730  piperacillin-tazobactam (ZOSYN) IVPB 3.375 g        3.375 g 100 mL/hr over 30 Minutes Intravenous  Once 10/21/19 1716 10/21/19 1930   10/21/19 1730  vancomycin (VANCOREADY) IVPB 1250 mg/250 mL        1,250 mg 166.7 mL/hr over 90 Minutes Intravenous  Once 10/21/19 1716 10/21/19 2035        Assessment/Plan A. flutter on coumadin (INR 3.3) and amio drip Seizure disorder Prior CVA HTN DM2  CAP - Per [facility] -   Supraumbilical hernia - no evidence of incarceration on CT. Does not appear to be the transition point on CT. The hernia is reducible on exam. No indication for emergent or urgent surgery. Follow up outpatient.   SBO vs Ileus - No prior hx of abdominal surgeries - No indication for emergent or urgent surgery at this time - Patient is without abdominal pain, n/v. Did have some dry heaves and nausea overnight that has now resolved. Agree with holding off on NGT currently. If develops abdominal pain, distension, n/v would recommend NGT and SBO protocol.  - Keep K > 4 and Mg > 2 for bowel function - Mobilize as able for bowel function   FEN - NPO, IVF, replace K (  2.8) VTE - SCDs, please hold coumadin, okay for heparin drip ID - Abx for PNA per [place] 10/23/2019, 11:04 AM Please see Amion for pager number during day hours 7:00am-4:30pm

## 2019-10-23 NOTE — Progress Notes (Signed)
SLP Cancellation Note  Patient Details Name: [name] MRN: [id number] DOB: [birth year]   Cancelled treatment:        Dr. Avon Gully advised that pt will continue NPO status due to ileus until further notice. Will follow up tomorrow and proceed with evaluation when cleared.    Houston Siren 10/23/2019, 9:54 AM  Orbie Pyo Colvin Caroli.Ed Risk analyst [phone] Office [phone]

## 2019-10-23 NOTE — Progress Notes (Signed)
ANTICOAGULATION CONSULT NOTE - Initial Consult  Pharmacy Consult for heparin Indication: atrial fibrillation  No Known Allergies  Patient Measurements: Height: 6' (182.9 cm) Weight: 55 kg (121 lb 4.1 oz) IBW/kg (Calculated) : 77.6 Heparin Dosing Weight: TBW  Vital Signs: Temp: 98.2 F (36.8 C) (08/03 1200) Temp Source: Oral (08/03 1200) BP: 112/53 (08/03 1200) Pulse Rate: 88 (08/03 1200)  Labs: Recent Labs    10/21/19 1607 10/21/19 1607 10/21/19 1742 10/21/19 1950 10/22/19 0415 10/23/19 0140  HGB 9.3*   < >  --   --  8.7* 7.8*  HCT 30.5*  --   --   --  27.8* 24.7*  PLT 316  --   --   --  226 231  LABPROT  --   --  25.3*  --  28.3* 32.8*  INR  --   --  2.4*  --  2.8* 3.3*  CREATININE 0.86  --   --   --  0.70 0.79  TROPONINIHS  --   --  18* 22*  --   --    < > = values in this interval not displayed.    Estimated Creatinine Clearance: 57.3 mL/min (by C-G formula based on SCr of 0.79 mg/dL).  Assessment: 65 YOM presenting generalized fatigue and low grade fever, hx of Afib on warfarin PTA which was continued here.  Also with obstruction/Ileus and to be NPO, for heparin start.  INR today therapeutic at 2.8, last dose administered 8/1.    INR remains elevated at 3.3  Goal of Therapy:  Heparin level 0.3-0.7 units/ml Monitor platelets by anticoagulation protocol: Yes   Plan:  Start heparin gtt when INR subtherapeutic <2 Daily INR, heparin level, CBC, s/s bleeding F/u GI plans and ability to restart warfarin  Barth Kirks, PharmD, BCPS, BCCCP Clinical Pharmacist [phone]  Please check AMION for all [place] numbers  10/23/2019 2:03 PM

## 2019-10-23 NOTE — Progress Notes (Addendum)
PROGRESS NOTE    [name]  NFA:[id number] DOB: 05/30/1940 DOA: 10/21/2019 PCP: [name], MD   Brief Narrative:  [name] is a 79 y.o. male with medical history significant of A.fib  On Coumadin, Seizure DO, HTN , GERD, CVA, history of pancreatitis, history of hypertension. Presented with generalized fatigue, increased cough and today low grade fever. Was seen in ER on 7/30 for syncopal event.  Patient's family called 911 he was evaluated in emergency department and discharged to home. Daughter has voiced her concerns regarding amiodarone she states ever since he was started he has not been himself.  He has had a number of ER visits since then.  Patient's daughter is concerned that the milligrams was causing weakness and malaise. Recently was admitted for GI Bleed in end of June. Coumadin had to be reversed at that time. Pt was transfused at discharge hg was 8.7, GI rec restarting coumadin. He has been taking his meds. He has been vaccinated against COVID. His covid testing at admission was NEGATIVE   Assessment & Plan:   Active Problems:   Essential hypertension   CIRRHOSIS, ALCOHOLIC, LIVER   Seizure disorder (HCC)   Paroxysmal atrial flutter (HCC)   Chronic anticoagulation   Dementia (AD)   Localization-related symptomatic epilepsy and epileptic syndromes with complex partial seizures, not intractable, without status epilepticus ([place])   Normochromic anemia   CAP (community acquired pneumonia)   Sepsis ([place])   Hypomagnesemia   Hypophosphatemia   Acute hypoxic respiratory failure in the setting of Sepsis due to CAP, POA Rule out aspiration RLL opacification concerning for CAP/Aspiration; meet sepsis criteria given leukocytosis, tachycardia, febrile at admission Continue doxycycline x5 days Respiratory viral panel negative, sputum culture pending Urine culture - inadequate growth  Blood cultures negative  Strep pneumo UA antigen negative Wean oxygen to  maintain sats 88-92% SpO2: 100 % O2 Flow Rate (L/min): 2 L/min  Abd distention/Diarrhea in the setting of partial obstruction/ileus As seen on CT/KUB at intake - KUB repeated 8/3 appears unchanged per [facility] Family reported some diarrhea, pt denies any abd pain Abdomen seems more likely distended this morning NG tube placement pending surgical evaluation Surgery requested to follow along given clinically worsening status, follow imaging as above Keep NPO at this time and follow BM/Symptoms GI following currently in agreement with above, appreciate insight recommendations  Cirrhosis, alcoholic liver, chronic, stable - No longer drinks alcohol per family - Labs WNL Lab Results  Component Value Date   ALT 22 10/23/2019   AST 34 10/23/2019   ALKPHOS 59 10/23/2019   BILITOT 1.0 10/23/2019   Dementia, otherwise unspecified, advanced  - Stable - High risk for sundown/delirium  - Avoid BEERS criteria medications as possible  Hyperglycemia - Possibly reactive -does not meet criteria for DM - no prior diagnosis of DM , no steroid use - NPO currently - will order SSI Lab Results  Component Value Date   HGBA1C 4.7 (L) 10/21/2019   Essential hypertension, chronic  - Currently NPO - continue only PRN meds at this point - concern for hypotension in the setting of dehydration and sepsis  Localization-related symptomatic epilepsy and epileptic syndromes with complex partial seizures, not intractable, without status epilepticus (HCC) - cekc Dilantin level, Continue homemeds  Normochromic anemia - HG up from baseline cont to monitor  Paroxysmal atrial flutter, rate controlled  CHA2DS2 vas score 5 continue current anticoagulation with Coumadin per pharmacy,   Rate control: patient remains tachycardiac, rehydrate and control fever if  tachycardia persists my try Cardizem IV Rhythm control:  Continue amiodarone  Right musculoskeletal shoulder pain; -Patient complains of right arm and shoulder  stiffness grabbing his triceps -Skeletal exam and passive/active range of motion without any elicited response, no indication for x-ray at this point given negative exam  DVT prophylaxis: on coumadin - will need to hold if surgery is concerned patient may require intervention/surgery  Code Status: FULL CODE   Family Communication: Lengthy discussion with daughter over the phone, she voiced concern over patient not being fed, we discussed at length the need for n.p.o. status during a bowel obstruction, that the patient was getting IV fluids and that if this was a prolonged event we would consider TPN but given the risks associated at this time it is not prudent to start.  Status is: Inpatient Dispo: The patient is from: Home              Anticipated d/c is to: To be determined              Anticipated d/c date is: 72 to 96 hours              Patient currently not medically stable for discharge given acute hypoxia in the setting of sepsis and pneumonia, possible bowel obstruction currently n.p.o. unable to tolerate p.o. safely.  Consultants:   GI  Procedures:   None planned  Antimicrobials:  Doxycycline, Unasyn initiated 10/21/2019  Subjective: No acute issues or events this morning, patient indicates his abdomen is still nontender, somewhat somnolent early this morning but redirectable and answers questions appropriately. Otherwise denies nausea, vomiting, headache, fevers, chills, chest pain, shortness of breath.  Objective: Vitals:   10/22/19 2100 10/22/19 2127 10/22/19 2300 10/23/19 0307  BP: 131/61  128/63   Pulse: (!) 115  (!) 129   Resp: (!) 21  20   Temp: 99.5 F (37.5 C) 99.5 F (37.5 C) 99 F (37.2 C) 99.2 F (37.3 C)  TempSrc:  Oral  Oral  SpO2: 95%  100%   Weight:      Height:        Intake/Output Summary (Last 24 hours) at 10/23/2019 0718 Last data filed at 10/23/2019 0300 Gross per 24 hour  Intake 2044.79 ml  Output --  Net 2044.79 ml   Filed Weights    10/21/19 1603  Weight: 55 kg    Examination: General:  Pleasantly resting in bed, No acute distress. HEENT:  Normocephalic atraumatic.  Sclerae nonicteric, noninjected.  Extraocular movements intact bilaterally. Neck:  Without mass or deformity.  Trachea is midline. Lungs: Left basilar rhonchi, otherwise without overt rales or wheeze Heart:  Regular rate and rhythm.  Without murmurs, rubs, or gallops. Abdomen:  Soft, nontender,  moderately distended.  Without guarding or rebound. Extremities: Without cyanosis, clubbing, edema, or obvious deformity. Vascular:  Dorsalis pedis and posterior tibial pulses palpable bilaterally. Skin:  Warm and dry, no erythema   Data Reviewed: I have personally reviewed following labs and imaging studies  CBC: Recent Labs  Lab 10/19/19 1900 10/21/19 1607 10/22/19 0415 10/23/19 0140  WBC 14.3* 18.1* 25.1* 11.1*  NEUTROABS 12.3*  --  23.3*  --   HGB 9.6* 9.3* 8.7* 7.8*  HCT 31.7* 30.5* 27.8* 24.7*  MCV 93.0 91.6 89.7 89.5  PLT 250 316 226 263   Basic Metabolic Panel: Recent Labs  Lab 10/19/19 1900 10/21/19 1607 10/21/19 2339 10/22/19 0415 10/23/19 0140  NA 138 135  --  136 139  K 4.3 3.4*  --  3.2* 2.8*  CL 102 99  --  101 102  CO2 28 24  --  25 24  GLUCOSE 149* 239*  --  140* 114*  BUN 13 14  --  14 11  CREATININE 0.82 0.86  --  0.70 0.79  CALCIUM 8.7* 8.9  --  8.3* 7.7*  MG  --   --  1.5* 1.8  --   PHOS  --   --  2.2* 2.1*  --    GFR: Estimated Creatinine Clearance: 57.3 mL/min (by C-G formula based on SCr of 0.79 mg/dL). Liver Function Tests: Recent Labs  Lab 10/19/19 1900 10/21/19 1742 10/22/19 0415 10/23/19 0140  AST 36 46* 39 34  ALT 21 23 22 22   ALKPHOS 93 77 69 59  BILITOT 0.5 0.7 0.9 1.0  PROT 7.3 7.4 6.2* 5.4*  ALBUMIN 3.8 3.6 3.0* 2.6*   No results for input(s): LIPASE, AMYLASE in the last 168 hours. No results for input(s): AMMONIA in the last 168 hours. Coagulation Profile: Recent Labs  Lab 10/16/19 1414  10/19/19 1900 10/21/19 1742 10/22/19 0415 10/23/19 0140  INR 4.3* 3.4* 2.4* 2.8* 3.3*   Cardiac Enzymes: No results for input(s): CKTOTAL, CKMB, CKMBINDEX, TROPONINI in the last 168 hours. BNP (last 3 results) No results for input(s): PROBNP in the last 8760 hours. HbA1C: Recent Labs    10/21/19 2339  HGBA1C 4.7*   CBG: Recent Labs  Lab 10/22/19 0808 10/22/19 1153 10/22/19 1606 10/22/19 2305 10/23/19 0305  GLUCAP 154* 91 102* 128* 110*   Lipid Profile: No results for input(s): CHOL, HDL, LDLCALC, TRIG, CHOLHDL, LDLDIRECT in the last 72 hours. Thyroid Function Tests: Recent Labs    10/21/19 2341  TSH 0.781   Anemia Panel: No results for input(s): VITAMINB12, FOLATE, FERRITIN, TIBC, IRON, RETICCTPCT in the last 72 hours. Sepsis Labs: Recent Labs  Lab 10/21/19 1607 10/21/19 1743 10/21/19 2339 10/22/19 0415 10/23/19 0140  PROCALCITON  --   --  1.97 1.75 1.30  LATICACIDVEN 3.2* 3.1* 2.1*  --   --     Recent Results (from the past 240 hour(s))  SARS Coronavirus 2 by RT PCR (hospital order, performed in [facility] hospital lab) Nasopharyngeal Nasopharyngeal Swab     Status: None   Collection Time: 10/19/19  8:32 PM   Specimen: Nasopharyngeal Swab  Result Value Ref Range Status   SARS Coronavirus 2 NEGATIVE NEGATIVE Final    Comment: (NOTE) SARS-CoV-2 target nucleic acids are NOT DETECTED.  The SARS-CoV-2 RNA is generally detectable in upper and lower respiratory specimens during the acute phase of infection. The lowest concentration of SARS-CoV-2 viral copies this assay can detect is 250 copies / mL. A negative result does not preclude SARS-CoV-2 infection and should not be used as the sole basis for treatment or other patient management decisions.  A negative result may occur with improper specimen collection / handling, submission of specimen other than nasopharyngeal swab, presence of viral mutation(s) within the areas targeted by this assay, and  inadequate number of viral copies (<250 copies / mL). A negative result must be combined with clinical observations, patient history, and epidemiological information.  Fact Sheet for Patients:   StrictlyIdeas.no  Fact Sheet for Healthcare Providers: BankingDealers.co.za  This test is not yet approved or  cleared by the [place] FDA and has been authorized for detection and/or diagnosis of SARS-CoV-2 by FDA under an Emergency Use Authorization (EUA).  This EUA will remain in effect (meaning this test can be used)  for the duration of the COVID-19 declaration under Section 564(b)(1) of the Act, 21 U.S.C. section 360bbb-3(b)(1), unless the authorization is terminated or revoked sooner.  Performed at [facility], [place] [address] [place], [place] [address]   Urine culture     Status: None   Collection Time: 10/19/19  9:50 PM   Specimen: In/Out Cath Urine  Result Value Ref Range Status   Specimen Description IN/OUT CATH URINE  Final   Special Requests NONE  Final   Culture   Final    NO GROWTH Performed at [facility], [place] [address], [place], [place] [address]    Report Status 10/21/2019 FINAL  Final  Blood culture (routine x 2)     Status: None (Preliminary result)   Collection Time: 10/21/19  5:43 PM   Specimen: BLOOD  Result Value Ref Range Status   Specimen Description BLOOD SITE NOT SPECIFIED  Final   Special Requests   Final    BOTTLES DRAWN AEROBIC AND ANAEROBIC Blood Culture adequate volume   Culture   Final    NO GROWTH < 24 HOURS Performed at [facility], [place] [address] [place], [place] [address]    Report Status PENDING  Incomplete  Blood culture (routine x 2)     Status: None (Preliminary result)   Collection Time: 10/21/19  6:11 PM   Specimen: BLOOD LEFT FOREARM  Result Value Ref Range Status   Specimen Description BLOOD LEFT FOREARM  Final   Special Requests   Final    BOTTLES DRAWN  AEROBIC AND ANAEROBIC Blood Culture results may not be optimal due to an inadequate volume of blood received in culture bottles   Culture   Final    NO GROWTH < 24 HOURS Performed at [facility], [place] [address], [place], [place] [address]    Report Status PENDING  Incomplete  Urine culture     Status: Abnormal   Collection Time: 10/21/19  8:50 PM   Specimen: Urine, Clean Catch  Result Value Ref Range Status   Specimen Description URINE, CLEAN CATCH  Final   Special Requests NONE  Final   Culture (A)  Final    <10,000 COLONIES/mL INSIGNIFICANT GROWTH Performed at [facility], [place] [address] [place], [place] [address]    Report Status 10/22/2019 FINAL  Final  Respiratory Panel by PCR     Status: None   Collection Time: 10/21/19  9:27 PM   Specimen: Nasopharyngeal Swab; Respiratory  Result Value Ref Range Status   Adenovirus NOT DETECTED NOT DETECTED Final   Coronavirus 229E NOT DETECTED NOT DETECTED Final    Comment: (NOTE) The Coronavirus on the Respiratory Panel, DOES NOT test for the novel  Coronavirus (2019 nCoV)    Coronavirus HKU1 NOT DETECTED NOT DETECTED Final   Coronavirus NL63 NOT DETECTED NOT DETECTED Final   Coronavirus OC43 NOT DETECTED NOT DETECTED Final   Metapneumovirus NOT DETECTED NOT DETECTED Final   Rhinovirus / Enterovirus NOT DETECTED NOT DETECTED Final   Influenza A NOT DETECTED NOT DETECTED Final   Influenza B NOT DETECTED NOT DETECTED Final   Parainfluenza Virus 1 NOT DETECTED NOT DETECTED Final   Parainfluenza Virus 2 NOT DETECTED NOT DETECTED Final   Parainfluenza Virus 3 NOT DETECTED NOT DETECTED Final   Parainfluenza Virus 4 NOT DETECTED NOT DETECTED Final   Respiratory Syncytial Virus NOT DETECTED NOT DETECTED Final   Bordetella pertussis NOT DETECTED NOT DETECTED Final  Chlamydophila pneumoniae NOT DETECTED NOT DETECTED Final   Mycoplasma pneumoniae NOT DETECTED NOT DETECTED Final    Comment: Performed at [facility],  [place] [address] [place], [place] [address]  Culture, sputum-assessment     Status: None   Collection Time: 10/21/19 11:44 PM   Specimen: Sputum  Result Value Ref Range Status   Specimen Description SPUTUM  Final   Special Requests COLLECTED IN THE ER  Final   Sputum evaluation   Final    THIS SPECIMEN IS ACCEPTABLE FOR SPUTUM CULTURE Performed at [facility], [address], [place], [place] [address]    Report Status 10/22/2019 FINAL  Final  Culture, respiratory     Status: None (Preliminary result)   Collection Time: 10/21/19 11:44 PM   Specimen: SPU  Result Value Ref Range Status   Specimen Description SPUTUM  Final   Special Requests COLLECTED IN THE ER Reflexed from [id number]  Final   Gram Stain   Final    RARE WBC PRESENT,BOTH PMN AND MONONUCLEAR FEW GRAM POSITIVE COCCI IN PAIRS FEW GRAM VARIABLE ROD RARE BUDDING YEAST SEEN Performed at [facility], [place] [address], [place], [place] [address]    Culture PENDING  Incomplete   Report Status PENDING  Incomplete  MRSA PCR Screening     Status: None   Collection Time: 10/22/19 12:43 AM   Specimen: Nasal Mucosa; Nasopharyngeal  Result Value Ref Range Status   MRSA by PCR NEGATIVE NEGATIVE Final    Comment:        The GeneXpert MRSA Assay (FDA approved for NASAL specimens only), is one component of a comprehensive MRSA colonization surveillance program. It is not intended to diagnose MRSA infection nor to guide or monitor treatment for MRSA infections. Performed at [facility], [place] [address], [place],  [address]          Radiology Studies: DG Chest 2 View  Result Date: 10/21/2019 CLINICAL DATA:  Cough, chest pain, weakness EXAM: CHEST - 2 VIEW COMPARISON:  Chest x-rays dated 10/19/2019 and 09/09/2019. FINDINGS: Heart size and mediastinal contours are stable. Lungs are hyperexpanded. Retrocardiac opacity, pneumonia versus atelectasis. No pleural effusion or pneumothorax is seen. No acute appearing  osseous abnormality. IMPRESSION: 1. Retrocardiac opacity, pneumonia versus atelectasis. 2. Hyperexpanded lungs suggesting COPD. Electronically Signed   By: Franki Cabot [name]   On: 10/21/2019 16:41   DG Abd 1 View  Result Date: 10/21/2019 CLINICAL DATA:  Abdominal distension EXAM: ABDOMEN - 1 VIEW COMPARISON:  None. FINDINGS: Diffusely mildly dilated air-filled loops of bowel are seen throughout the abdomen. There is question of a double wall sign seen within the mid abdomen which could represent a small amount of pneumoperitoneum. Degenerative changes in the lumbar spine and bilateral hips. IMPRESSION: Mildly dilated air-filled loops of bowel throughout the abdomen with question of double wall sign which could represent pneumoperitoneum. If further evaluation is required would recommend lateral decubitus film or cross-sectional imaging. Electronically Signed   By: Prudencio Pair [name]   On: 10/21/2019 22:14   CT ABDOMEN PELVIS W CONTRAST  Result Date: 10/21/2019 CLINICAL DATA:  Free air seen on x-ray. Peritonitis or perforation suspected. History of pancreatitis and hiatal hernia. EXAM: CT ABDOMEN AND PELVIS WITH CONTRAST TECHNIQUE: Multidetector CT imaging of the abdomen and pelvis was performed using the standard protocol following bolus administration of intravenous contrast. CONTRAST:  121mL OMNIPAQUE IOHEXOL 300 MG/ML  SOLN COMPARISON:  Abdomen 10/21/2019.  CT abdomen and  pelvis 07/21/2019 FINDINGS: Lower chest: Airspace consolidation in both lung bases. Small pericardial effusion. Cardiac enlargement. Hepatobiliary: Scattered calcifications throughout the liver, likely granulomas. No focal lesions. Portal veins are patent. Gallbladder is decompressed. No bile duct dilatation. Pancreas: Atrophic.  No mass or ductal dilatation appreciated. Spleen: Multiple calcifications, likely granulomas. Adrenals/Urinary Tract: No adrenal gland nodules. Kidneys are symmetrical. No renal mass lesion or hydronephrosis. No  hydroureter. Bladder is normal. Stomach/Bowel: Moderately dilated gas and fluid-filled small bowel. Portions of the colon are also dilated. No wall thickening or mass appreciated. Terminal ileum is decompressed. Changes likely represent obstruction although ileus may also be present. No free air. Vascular/Lymphatic: Calcific aortic atherosclerosis. No aortic aneurysm. No significant lymphadenopathy. Reproductive: Prostate is unremarkable. Other: No abdominal wall hernia or abnormality. No abdominopelvic ascites. Musculoskeletal: Internal fixation of the left hip. Heterotopic bone formation. Degenerative changes in the spine and hips. Ankylosis of the thoracolumbar spine. IMPRESSION: 1. Moderately dilated gas and fluid-filled small bowel and colon with decompressed terminal ileum. Changes likely represent obstruction although ileus may also be present. No wall thickening or mass appreciated. 2. Airspace consolidation in both lung bases may represent pneumonia or aspiration. 3. Cardiac enlargement with small pericardial effusion. 4. Aortic atherosclerosis. 5. Calcified granulomas in the liver and spleen. 6. Internal fixation of the left hip. Heterotopic bone formation. Aortic Atherosclerosis (ICD10-I70.0). Electronically Signed   By: Lucienne Capers [name]   On: 10/21/2019 23:42   ECHOCARDIOGRAM COMPLETE  Result Date: 10/22/2019    ECHOCARDIOGRAM REPORT   Patient Name:   DAEVION NAVARETTE Bodenheimer Date of Exam: 10/22/2019 Medical Rec #:  [id number]            Height:       72.0 in Accession #:    [id number]           Weight:       121.3 lb Date of Birth:  Dec 24, 1940             BSA:          1.722 m Patient Age:    34 years             BP:           101/60 mmHg Patient Gender: M                    HR:           119 bpm. Exam Location:  Inpatient Procedure: 2D Echo Indications:    780.2 syncope  History:        Patient has prior history of Echocardiogram examinations, most                 recent 07/24/2019. Stroke,  Arrythmias:Atrial Flutter; Risk                 Factors:Diabetes, Hypertension and Former Smoker.  Sonographer:    Jannett Celestine RDCS (AE) Referring Phys: [id number] ANASTASSIA DOUTOVA  Sonographer Comments: Image acquisition challenging due to respiratory motion. IMPRESSIONS  1. Left ventricular ejection fraction, by estimation, is 65 to 70%. The left ventricle has normal function. Left ventricular endocardial border not optimally defined to evaluate regional wall motion. There is mild left ventricular hypertrophy. Left ventricular diastolic parameters are indeterminate.  2. Right ventricular systolic function is normal. The right ventricular size is normal. Tricuspid regurgitation signal is inadequate for assessing PA pressure.  3. Right atrial size was mildly dilated.  4. The mitral valve is grossly normal. Trivial mitral valve  regurgitation.  5. The aortic valve was not well visualized. Aortic valve regurgitation is not visualized. No aortic stenosis is present.  6. The inferior vena cava is dilated in size with >50% respiratory variability, suggesting right atrial pressure of 8 mmHg. FINDINGS  Left Ventricle: Left ventricular ejection fraction, by estimation, is 65 to 70%. The left ventricle has normal function. Left ventricular endocardial border not optimally defined to evaluate regional wall motion. The left ventricular internal cavity size was normal in size. There is mild left ventricular hypertrophy. Left ventricular diastolic parameters are indeterminate. Right Ventricle: The right ventricular size is normal. No increase in right ventricular wall thickness. Right ventricular systolic function is normal. Tricuspid regurgitation signal is inadequate for assessing PA pressure. Left Atrium: Left atrial size was normal in size. Right Atrium: Right atrial size was mildly dilated. Pericardium: Trivial pericardial effusion is present. The pericardial effusion is surrounding the apex. Mitral Valve: The mitral valve is  grossly normal. Trivial mitral valve regurgitation. Tricuspid Valve: The tricuspid valve is grossly normal. Tricuspid valve regurgitation is trivial. Aortic Valve: The aortic valve was not well visualized. Aortic valve regurgitation is not visualized. No aortic stenosis is present. Pulmonic Valve: The pulmonic valve was not well visualized. Pulmonic valve regurgitation is not visualized. Aorta: The ascending aorta was not well visualized. Venous: The inferior vena cava was not well visualized. The inferior vena cava is dilated in size with greater than 50% respiratory variability, suggesting right atrial pressure of 8 mmHg. IAS/Shunts: The interatrial septum was not well visualized.  LEFT VENTRICLE PLAX 2D LVIDd:         2.60 cm LVIDs:         2.00 cm LV PW:         1.20 cm LV IVS:        1.20 cm LVOT diam:     2.20 cm LV SV:         50 LV SV Index:   29 LVOT Area:     3.80 cm  LEFT ATRIUM           Index       RIGHT ATRIUM           Index LA diam:      3.10 cm 1.80 cm/m  RA Area:     14.90 cm LA Vol (A2C): 33.1 ml 19.22 ml/m RA Volume:   39.20 ml  22.76 ml/m  AORTIC VALVE LVOT Vmax:   110.00 cm/s LVOT Vmean:  68.700 cm/s LVOT VTI:    0.132 m  AORTA Ao Root diam: 3.30 cm  SHUNTS Systemic VTI:  0.13 m Systemic Diam: 2.20 cm Cherlynn Kaiser MD Electronically signed by Cherlynn Kaiser MD Signature Date/Time: 10/22/2019/6:14:52 PM    Final         Scheduled Meds: . docusate sodium  100 mg Oral BID  . folic acid  1 mg Oral Daily  . guaiFENesin  600 mg Oral BID  . insulin aspart  0-9 Units Subcutaneous Q4H  . levETIRAcetam  1,500 mg Oral BID  . pantoprazole  40 mg Oral Q0600  . phenytoin  200 mg Oral QHS  . sodium chloride flush  3 mL Intravenous Q12H   Continuous Infusions: . amiodarone 30 mg/hr (10/23/19 0300)  . ampicillin-sulbactam (UNASYN) IV 3 g (10/23/19 0609)  . doxycycline (VIBRAMYCIN) IV Stopped (10/23/19 0108)     LOS: 1 day    Time spent: 72 min  Little Ishikawa, DO [facility]  If  7PM-7AM, please contact night-coverage [email or web address]  10/23/2019, 7:18 AM

## 2019-10-23 NOTE — Consult Note (Signed)
   [facility] Inpatient Consult   10/23/2019  Tommey Barret [facility] [birth year] [phone]   Patient is currently active with [facility] for chronic disease management services.  Patient has been engaged by a [place] RN. Our community based plan of care has focused on disease management and community resource support.     Will continue to follow for progression and disposition plans.  Of note, [name] Care Management services does not replace or interfere with any services that are arranged by inpatient case management or social work.  Netta Cedars, MSN, [facility] Liaison Nurse Mobile Phone [phone]  Toll free office [phone]

## 2019-10-23 NOTE — Progress Notes (Signed)
Initial Nutrition Assessment  DOCUMENTATION CODES:   Underweight, Severe malnutrition in context of chronic illness  INTERVENTION:   Once diet advanced:   Ensure Enlive po TID, each supplement provides 350 kcal and 20 grams of protein  MVI daily   NUTRITION DIAGNOSIS:   Severe Malnutrition related to chronic illness (prior CVA/pancreatitis) as evidenced by severe fat depletion, severe muscle depletion, energy intake < or equal to 75% for > or equal to 1 month, percent weight loss.  GOAL:   Patient will meet greater than or equal to 90% of their needs   MONITOR:   PO intake, Supplement acceptance, Diet advancement, Weight trends, Labs, I & O's  REASON FOR ASSESSMENT:   Consult Assessment of nutrition requirement/status  ASSESSMENT:   Patient with PMH significant for seizure disorder, prior CVA, HTN, DM, and pancreatitis. Recently admitted in June for GI bleed. Presents this admission with supraumbilical hernia resulting in SBO.   Holding off on NG placement. Denies nausea/vomiting this am.   Pt endorses not eating much since his last admission. States during this time he consumed bites at each meal. Did not take supplements. Was diagnosed with severe malnutrition on 6/22. This continues. If intake does not progress after diet advancement, may need to consider nutrition support.   Pt is unsure of UBW. Unsure of amount of weight loss. Records indicate pt weighed 147 lb on 5/7 and 121 lb this admission (17.7% wt loss in three months, significant for time frame).   Medications: colace, folic acid, SS novolog  Labs: K 2.8 (L) CBG 102-128   NUTRITION - FOCUSED PHYSICAL EXAM:    Most Recent Value  Orbital Region Severe depletion  Upper Arm Region Severe depletion  Thoracic and Lumbar Region Severe depletion  Buccal Region Severe depletion  Temple Region Severe depletion  Clavicle Bone Region Severe depletion  Clavicle and Acromion Bone Region Severe depletion  Scapular  Bone Region Severe depletion  Dorsal Hand Severe depletion  Patellar Region Severe depletion  Anterior Thigh Region Severe depletion  Posterior Calf Region Severe depletion  Edema (RD Assessment) None  Hair Reviewed  Eyes Reviewed  Mouth Reviewed  Skin Reviewed  Nails Reviewed     Diet Order:   Diet Order            Diet NPO time specified  Diet effective now                 EDUCATION NEEDS:   Not appropriate for education at this time  Skin:  Skin Assessment: Reviewed RN Assessment  Last BM:  PTA  Height:   Ht Readings from Last 1 Encounters:  10/21/19 6' (1.829 m)    Weight:   Wt Readings from Last 1 Encounters:  10/21/19 55 kg    BMI:  Body mass index is 16.44 kg/m.  Estimated Nutritional Needs:   Kcal:  1650-1850 kcal  Protein:  80-100 grams  Fluid:  >/= 1.6 L/day   Mariana Single RD, LDN Clinical Nutrition Pager listed in [place]

## 2019-10-23 NOTE — Evaluation (Addendum)
Occupational Therapy Evaluation Patient Details Name: [name] MRN: [id number] DOB: Feb 03, 1941 Today's Date: 10/23/2019    History of Present Illness Pt is an 79 y/o male admitted secondary to generalized weakness and fever. Thought to be from CAP. Pt also with A fib and possible bowel obstruction vs. ileus. Presented withgeneralized fatigue, increased cough and today low grade fever. Was seen in ER on 7/30for syncopal event. Patient's family called 911 he was evaluated in emergency department and discharged to home. PMH includes HTN, seizures, dementia, a fib, and CVA.    Clinical Impression   PTA, pt lives alone and reports Modified Independence with ADLs, simple IADLs in the home, and mobility using RW. Pt presents now with diagnoses above and deficits in cardiopulmonary tolerance, strength, balance, cognition, and pain (R shoulder and back). Pt demonstrated ability to complete bed mobility with Mod A, but immediately reported need to return to supine due to perseveration on pain. Pt required intermittent Min A to maintain sitting balance EOB. Pt requires up to Mod A for UB ADLs and Total A for LB ADLs due to deficits. Plan to progress OOB activities for ADLs as appropriate during next session.     Follow Up Recommendations  SNF;Supervision/Assistance - 24 hour    Equipment Recommendations  Other (comment) (TBD)    Recommendations for Other Services       Precautions / Restrictions Precautions Precautions: Fall;Other (comment) Precaution Comments: watch HR  Restrictions Weight Bearing Restrictions: No      Mobility Bed Mobility Overal bed mobility: Needs Assistance Bed Mobility: Supine to Sit;Sit to Supine     Supine to sit: Mod assist Sit to supine: Mod assist   General bed mobility comments: Mod A for bed mobility with assistance needed to manage B LE in and out of bed, as well as advance trunk. Cued to use bedrail but pt opting for HHA from  therapist  Transfers                 General transfer comment: unable to assess due to pt pain and refusal    Balance Overall balance assessment: Needs assistance Sitting-balance support: Bilateral upper extremity supported;Feet supported Sitting balance-Leahy Scale: Poor Sitting balance - Comments: Intermittent Min A required to maintain sitting balance with posterior lean. Pt attempting to lay back in bed horizontally due to back pain                                   ADL either performed or assessed with clinical judgement   ADL Overall ADL's : Needs assistance/impaired Eating/Feeding: Set up;Sitting   Grooming: Set up;Sitting   Upper Body Bathing: Moderate assistance;Sitting   Lower Body Bathing: Maximal assistance;Sit to/from stand;Sitting/lateral leans   Upper Body Dressing : Moderate assistance;Sitting   Lower Body Dressing: Maximal assistance;Sit to/from stand       Toileting- Water quality scientist and Hygiene: Total assistance;Bed level         General ADL Comments: Pt with limitations in functional abilities secondary to R shoulder and back pain - perseverating on this. Pt also with decreased strength, cardiopulmonary tolerance and sitting balance.      Vision Baseline Vision/History: Wears glasses Wears Glasses: At all times Patient Visual Report: No change from baseline Vision Assessment?: No apparent visual deficits     Perception     Praxis      Pertinent Vitals/Pain Pain Assessment: Faces Pain Score: 3  Faces Pain Scale: Hurts even more Pain Location: R shoulder, back Pain Descriptors / Indicators: Sore;Discomfort;Grimacing;Guarding Pain Intervention(s): Monitored during session;Limited activity within patient's tolerance;Patient requesting pain meds-RN notified     Hand Dominance Right   Extremity/Trunk Assessment Upper Extremity Assessment Upper Extremity Assessment: Generalized weakness;RUE deficits/detail RUE  Deficits / Details: Pt with limited R shoulder movement. Reporting pain in scapula.  RUE: Unable to fully assess due to pain   Lower Extremity Assessment Lower Extremity Assessment: Defer to PT evaluation       Communication Communication Communication: HOH   Cognition Arousal/Alertness: Awake/alert Behavior During Therapy: Restless Overall Cognitive Status: Impaired/Different from baseline Area of Impairment: Orientation;Attention;Following commands;Memory;Safety/judgement;Awareness;Problem solving                 Orientation Level: Time;Situation (unsure of year, couldnt recall reasoning for hospitalization) Current Attention Level: Sustained Memory: Decreased short-term memory Following Commands: Follows multi-step commands inconsistently Safety/Judgement: Decreased awareness of safety;Decreased awareness of deficits Awareness: Emergent Problem Solving: Difficulty sequencing;Requires verbal cues General Comments: Dementia at baseline, impairments in problem solving and awareness of deficits. Pt focused on pain in R shoulder, unaware of deficits as pt reports he can walk now if he wanted to   General Comments  Pt on 2.5 L O2, SpO2 WFL during session. HR up to 120s with activity. BP stable at 111/57, denies dizziness.     Exercises     Shoulder Instructions      Home Living Family/patient expects to be discharged to:: Private residence Living Arrangements: Alone Available Help at Discharge: Family;Available PRN/intermittently Type of Home: House Home Access: Stairs to enter [employer] of Steps: 1 Entrance Stairs-Rails: None Home Layout: Able to live on main level with bedroom/bathroom     Bathroom Shower/Tub: Tub/shower unit;Walk-in shower   Bathroom Toilet: Standard     Home Equipment: Cane - single point;Walker - 2 wheels          Prior Functioning/Environment Level of Independence: Independent with assistive device(s)        Comments:  reports typically uses RW. Pt able to complete ADLs, microwave meals and laundry         OT Problem List: Decreased strength;Decreased activity tolerance;Decreased range of motion;Impaired balance (sitting and/or standing);Decreased cognition;Decreased safety awareness;Decreased knowledge of use of DME or AE;Cardiopulmonary status limiting activity;Impaired UE functional use      OT Treatment/Interventions: Self-care/ADL training;Therapeutic exercise;Energy conservation;DME and/or AE instruction;Therapeutic activities;Patient/family education    OT Goals(Current goals can be found in the care plan section) Acute Rehab OT Goals Patient Stated Goal: for shoulder to stop hurting OT Goal Formulation: With patient Time For Goal Achievement: 11/06/19 Potential to Achieve Goals: Good ADL Goals Pt Will Perform Upper Body Dressing: with supervision;sitting Pt Will Perform Lower Body Dressing: with mod assist;sit to/from stand Additional ADL Goal #1: Pt to demonstrate ability to sit EOB for 5 minutes without external support from therapist during ADL tasks Additional ADL Goal #2: Pt to complete sit to stand transfer with Mod A and RW in preparation for ADL transfers  OT Frequency: Min 2X/week   Barriers to D/C:            Co-evaluation              AM-PAC OT "6 Clicks" Daily Activity     Outcome Measure Help from another person eating meals?: A Little Help from another person taking care of personal grooming?: A Little Help from another person toileting, which includes using toliet, bedpan, or urinal?: Total  Help from another person bathing (including washing, rinsing, drying)?: A Lot Help from another person to put on and taking off regular upper body clothing?: A Lot Help from another person to put on and taking off regular lower body clothing?: A Lot 6 Click Score: 13   End of Session Equipment Utilized During Treatment: Oxygen Nurse Communication: Mobility status;Patient  requests pain meds  Activity Tolerance: Patient limited by pain Patient left: in bed;with call bell/phone within reach;with bed alarm set  OT Visit Diagnosis: Unsteadiness on feet (R26.81);Other abnormalities of gait and mobility (R26.89);Muscle weakness (generalized) (M62.81);History of falling (Z91.81)                Time: 8250-5397 OT Time Calculation (min): 24 min Charges:  OT General Charges $OT Visit: 1 Visit OT Evaluation $OT Eval Moderate Complexity: 1 Mod OT Treatments $Therapeutic Activity: 8-22 mins  Layla Maw, OTR/L  Layla Maw 10/23/2019, 10:33 AM

## 2019-10-24 LAB — COMPREHENSIVE METABOLIC PANEL
ALT: 21 U/L (ref 0–44)
AST: 28 U/L (ref 15–41)
Albumin: 2.7 g/dL — ABNORMAL LOW (ref 3.5–5.0)
Alkaline Phosphatase: 58 U/L (ref 38–126)
Anion gap: 11 (ref 5–15)
BUN: 11 mg/dL (ref 8–23)
CO2: 23 mmol/L (ref 22–32)
Calcium: 7.7 mg/dL — ABNORMAL LOW (ref 8.9–10.3)
Chloride: 106 mmol/L (ref 98–111)
Creatinine, Ser: 0.76 mg/dL (ref 0.61–1.24)
GFR calc Af Amer: >60 mL/min (ref >60)
GFR calc non Af Amer: >60 mL/min (ref >60)
Glucose, Bld: 103 mg/dL — ABNORMAL HIGH (ref 70–99)
Potassium: 3.2 mmol/L — ABNORMAL LOW (ref 3.5–5.1)
Sodium: 140 mmol/L (ref 135–145)
Total Bilirubin: 1.4 mg/dL — ABNORMAL HIGH (ref 0.3–1.2)
Total Protein: 5.8 g/dL — ABNORMAL LOW (ref 6.5–8.1)

## 2019-10-24 LAB — GLUCOSE, CAPILLARY
Glucose-Capillary: 96 mg/dL (ref 70–99)
Glucose-Capillary: 99 mg/dL (ref 70–99)
Glucose-Capillary: 164 mg/dL — ABNORMAL HIGH (ref 70–99)
Glucose-Capillary: 148 mg/dL — ABNORMAL HIGH (ref 70–99)
Glucose-Capillary: 137 mg/dL — ABNORMAL HIGH (ref 70–99)

## 2019-10-24 LAB — CBC
HCT: 27.1 % — ABNORMAL LOW (ref 39.0–52.0)
Hemoglobin: 8.3 g/dL — ABNORMAL LOW (ref 13.0–17.0)
MCH: 27.4 pg (ref 26.0–34.0)
MCHC: 30.6 g/dL (ref 30.0–36.0)
MCV: 89.4 fL (ref 80.0–100.0)
Platelets: 282 10*3/uL (ref 150–400)
RBC: 3.03 MIL/uL — ABNORMAL LOW (ref 4.22–5.81)
RDW: 20.3 % — ABNORMAL HIGH (ref 11.5–15.5)
WBC: 9 10*3/uL (ref 4.0–10.5)
nRBC: 0 % (ref 0.0–0.2)

## 2019-10-24 LAB — MAGNESIUM: Magnesium: 1.6 mg/dL — ABNORMAL LOW (ref 1.7–2.4)

## 2019-10-24 LAB — PROTIME-INR
INR: 3.9 — ABNORMAL HIGH (ref 0.8–1.2)
Prothrombin Time: 37.3 seconds — ABNORMAL HIGH (ref 11.4–15.2)

## 2019-10-24 LAB — CULTURE, RESPIRATORY W GRAM STAIN: Culture: NORMAL

## 2019-10-24 MED ORDER — AMIODARONE HCL 200 MG PO TABS: 400.0000 mg | ORAL_TABLET | Freq: Every day | ORAL | Status: DC

## 2019-10-24 MED ORDER — POTASSIUM CHLORIDE 10 MEQ/100ML IV SOLN
10.0000 meq | INTRAVENOUS | Status: AC
  Administered 2019-10-24 (×3): 10 meq via INTRAVENOUS
  Filled 2019-10-24 (×2): qty 100

## 2019-10-24 MED ORDER — LEVETIRACETAM 500 MG PO TABS
1500.0000 mg | ORAL_TABLET | Freq: Two times a day (BID) | ORAL | Status: DC
  Administered 2019-10-24 – 2019-10-31 (×13): 1500 mg via ORAL
  Filled 2019-10-24 (×14): qty 3

## 2019-10-24 MED ORDER — WARFARIN - PHARMACIST DOSING INPATIENT: Freq: Every day | Status: DC

## 2019-10-24 MED ORDER — PHENYTOIN 50 MG PO CHEW
100.0000 mg | CHEWABLE_TABLET | Freq: Two times a day (BID) | ORAL | Status: DC
  Administered 2019-10-24 – 2019-10-31 (×14): 100 mg via ORAL
  Filled 2019-10-24 (×16): qty 2

## 2019-10-24 MED ORDER — MAGNESIUM SULFATE 4 GM/100ML IV SOLN
4.0000 g | Freq: Once | INTRAVENOUS | Status: AC
  Administered 2019-10-24: 4 g via INTRAVENOUS
  Filled 2019-10-24: qty 100

## 2019-10-24 MED ORDER — AMIODARONE HCL 200 MG PO TABS: 200.0000 mg | ORAL_TABLET | Freq: Every day | ORAL | Status: DC

## 2019-10-24 MED ORDER — DOXYCYCLINE HYCLATE 100 MG PO TABS
100.0000 mg | ORAL_TABLET | Freq: Two times a day (BID) | ORAL | Status: AC
  Administered 2019-10-24 – 2019-10-27 (×6): 100 mg via ORAL
  Filled 2019-10-24 (×7): qty 1

## 2019-10-24 MED ORDER — POLYETHYLENE GLYCOL 3350 17 G PO PACK
17.0000 g | PACK | Freq: Every day | ORAL | Status: DC
  Administered 2019-10-24 – 2019-10-31 (×7): 17 g via ORAL
  Filled 2019-10-24 (×7): qty 1

## 2019-10-24 MED ORDER — AMIODARONE HCL 200 MG PO TABS: 400.0000 mg | ORAL_TABLET | Freq: Two times a day (BID) | ORAL | Status: DC

## 2019-10-24 MED ORDER — BISACODYL 10 MG RE SUPP
10.0000 mg | Freq: Once | RECTAL | Status: DC
  Filled 2019-10-24 (×2): qty 1

## 2019-10-24 MED ORDER — AMIODARONE HCL 200 MG PO TABS
200.0000 mg | ORAL_TABLET | Freq: Two times a day (BID) | ORAL | Status: DC
  Administered 2019-10-24 – 2019-10-25 (×3): 200 mg via ORAL
  Filled 2019-10-24 (×3): qty 1

## 2019-10-24 MED ORDER — POTASSIUM CHLORIDE 10 MEQ/100ML IV SOLN
10.0000 meq | INTRAVENOUS | Status: AC
  Administered 2019-10-24 (×3): 10 meq via INTRAVENOUS
  Filled 2019-10-24 (×4): qty 100

## 2019-10-24 MED ORDER — FLEET ENEMA 7-19 GM/118ML RE ENEM
1.0000 | ENEMA | Freq: Once | RECTAL | Status: DC
  Filled 2019-10-24 (×2): qty 1

## 2019-10-24 NOTE — Progress Notes (Signed)
Progress Note  Patient Name: [name] Date of Encounter: 10/24/2019  [facility] Cardiologist: Fransico Him, MD   Subjective   No acute overnight events. Patient states he is doing terrible but feeling fine. When I asked for him to elaborate, he said "you don't want to hear about it all." He denies any abdominal pain, nausea, or vomiting. Tolerating clear liquids this morning. No chest pain, shortness of breath, or palpitations.  Inpatient Medications    Scheduled Meds: . bisacodyl  10 mg Rectal Once  . docusate sodium  100 mg Oral BID  . insulin aspart  0-9 Units Subcutaneous Q4H  . pantoprazole (PROTONIX) IV  40 mg Intravenous Q12H  . phenytoin (DILANTIN) IV  100 mg Intravenous Q12H  . sodium chloride flush  3 mL Intravenous Q12H  . sodium phosphate  1 enema Rectal Once   Continuous Infusions: . amiodarone 30 mg/hr (10/23/19 2300)  . ampicillin-sulbactam (UNASYN) IV 3 g (10/24/19 0459)  . doxycycline (VIBRAMYCIN) IV 100 mg (10/23/19 2224)  . levETIRAcetam 1,500 mg (10/24/19 0411)  . magnesium sulfate bolus IVPB    . potassium chloride 10 mEq (10/24/19 0900)   PRN Meds: acetaminophen **OR** acetaminophen, albuterol, bisacodyl, morphine injection, ondansetron **OR** ondansetron (ZOFRAN) IV   Vital Signs    Vitals:   10/24/19 0400 10/24/19 0437 10/24/19 0556 10/24/19 0719  BP: (!) 108/57 (!) 107/52 (!) 109/51 121/61  Pulse: (!) 105 (!) 115 (!) 117 (!) 112  Resp: 19 20 20 19   Temp:  98.5 F (36.9 C) 99 F (37.2 C) 99 F (37.2 C)  TempSrc:  Oral Oral Oral  SpO2: 100% 100% 100% 100%  Weight:      Height:        Intake/Output Summary (Last 24 hours) at 10/24/2019 1030 Last data filed at 10/23/2019 1800 Gross per 24 hour  Intake 1184.1 ml  Output 400 ml  Net 784.1 ml   Last 3 Weights 10/21/2019 10/19/2019 09/09/2019  Weight (lbs) 121 lb 4.1 oz 121 lb 7.6 oz 121 lb 8 oz  Weight (kg) 55 kg 55.1 kg 55.112 kg      Telemetry    Atrial flutter with rates  ranging from 90's to 120's but mostly in the 100's to 110's. - Personally Reviewed  ECG    No new ECG tracing today. - Personally Reviewed  Physical Exam   GEN: Thin, cachetic and ill appearing male. No acute distress.   Neck: Supple. No JVD Cardiac: Irregular and tachycardic. No murmurs, rubs, or gallops.  Respiratory: Clear to auscultation bilaterally. No wheezes, rhonchi, or rales. GI: Slightly firm and distended. Non-tender. Bowel sounds present. MS: No lower extremity edema. No deformity. Neuro:  No focal deficits. Psych: Normal affect   Labs    High Sensitivity Troponin:   Recent Labs  Lab 10/19/19 1900 10/21/19 1742 10/21/19 1950  TROPONINIHS 4 18* 22*      Chemistry Recent Labs  Lab 10/22/19 0415 10/23/19 0140 10/24/19 0212  NA 136 139 140  K 3.2* 2.8* 3.2*  CL 101 102 106  CO2 25 24 23   GLUCOSE 140* 114* 103*  BUN 14 11 11   CREATININE 0.70 0.79 0.76  CALCIUM 8.3* 7.7* 7.7*  PROT 6.2* 5.4* 5.8*  ALBUMIN 3.0* 2.6* 2.7*  AST 39 34 28  ALT 22 22 21   ALKPHOS 69 59 58  BILITOT 0.9 1.0 1.4*  GFRNONAA >60 >60 >60  GFRAA >60 >60 >60  ANIONGAP 10 13 11  Hematology Recent Labs  Lab 10/22/19 0415 10/23/19 0140 10/24/19 0212  WBC 25.1* 11.1* 9.0  RBC 3.10* 2.76* 3.03*  HGB 8.7* 7.8* 8.3*  HCT 27.8* 24.7* 27.1*  MCV 89.7 89.5 89.4  MCH 28.1 28.3 27.4  MCHC 31.3 31.6 30.6  RDW 20.2* 20.3* 20.3*  PLT 226 231 282    BNP Recent Labs  Lab 10/21/19 1742  BNP 391.1*     DDimer No results for input(s): DDIMER in the last 168 hours.   Radiology    DG Abd 2 Views  Result Date: 10/23/2019 CLINICAL DATA:  Small-bowel obstruction. EXAM: ABDOMEN - 2 VIEW COMPARISON:  A 03/2019 FINDINGS: Dilated large and small bowel loops appear unchanged. Lumbar spondylosis.  Left hip fracture repair. Bibasilar airspace disease.  Small left effusion. IMPRESSION: Dilated large and small bowel loops unchanged. Findings most compatible with ileus versus obstruction.  Electronically Signed   By: Franchot Gallo [name]   On: 10/23/2019 10:29   ECHOCARDIOGRAM COMPLETE  Result Date: 10/22/2019    ECHOCARDIOGRAM REPORT   Patient Name:   [name] Parkin Date of Exam: 10/22/2019 Medical Rec #:  [id number]            Height:       72.0 in Accession #:    [id number]           Weight:       121.3 lb Date of Birth:  December 24, 1940             BSA:          1.722 m Patient Age:    19 years             BP:           101/60 mmHg Patient Gender: M                    HR:           119 bpm. Exam Location:  Inpatient Procedure: 2D Echo Indications:    780.2 syncope  History:        Patient has prior history of Echocardiogram examinations, most                 recent 07/24/2019. Stroke, Arrythmias:Atrial Flutter; Risk                 Factors:Diabetes, Hypertension and Former Smoker.  Sonographer:    Jannett Celestine RDCS (AE) Referring Phys: [id number] ANASTASSIA DOUTOVA  Sonographer Comments: Image acquisition challenging due to respiratory motion. IMPRESSIONS  1. Left ventricular ejection fraction, by estimation, is 65 to 70%. The left ventricle has normal function. Left ventricular endocardial border not optimally defined to evaluate regional wall motion. There is mild left ventricular hypertrophy. Left ventricular diastolic parameters are indeterminate.  2. Right ventricular systolic function is normal. The right ventricular size is normal. Tricuspid regurgitation signal is inadequate for assessing PA pressure.  3. Right atrial size was mildly dilated.  4. The mitral valve is grossly normal. Trivial mitral valve regurgitation.  5. The aortic valve was not well visualized. Aortic valve regurgitation is not visualized. No aortic stenosis is present.  6. The inferior vena cava is dilated in size with >50% respiratory variability, suggesting right atrial pressure of 8 mmHg. FINDINGS  Left Ventricle: Left ventricular ejection fraction, by estimation, is 65 to 70%. The left ventricle has normal function. Left  ventricular endocardial border not optimally defined to evaluate regional wall motion. The left ventricular internal cavity size  was normal in size. There is mild left ventricular hypertrophy. Left ventricular diastolic parameters are indeterminate. Right Ventricle: The right ventricular size is normal. No increase in right ventricular wall thickness. Right ventricular systolic function is normal. Tricuspid regurgitation signal is inadequate for assessing PA pressure. Left Atrium: Left atrial size was normal in size. Right Atrium: Right atrial size was mildly dilated. Pericardium: Trivial pericardial effusion is present. The pericardial effusion is surrounding the apex. Mitral Valve: The mitral valve is grossly normal. Trivial mitral valve regurgitation. Tricuspid Valve: The tricuspid valve is grossly normal. Tricuspid valve regurgitation is trivial. Aortic Valve: The aortic valve was not well visualized. Aortic valve regurgitation is not visualized. No aortic stenosis is present. Pulmonic Valve: The pulmonic valve was not well visualized. Pulmonic valve regurgitation is not visualized. Aorta: The ascending aorta was not well visualized. Venous: The inferior vena cava was not well visualized. The inferior vena cava is dilated in size with greater than 50% respiratory variability, suggesting right atrial pressure of 8 mmHg. IAS/Shunts: The interatrial septum was not well visualized.  LEFT VENTRICLE PLAX 2D LVIDd:         2.60 cm LVIDs:         2.00 cm LV PW:         1.20 cm LV IVS:        1.20 cm LVOT diam:     2.20 cm LV SV:         50 LV SV Index:   29 LVOT Area:     3.80 cm  LEFT ATRIUM           Index       RIGHT ATRIUM           Index LA diam:      3.10 cm 1.80 cm/m  RA Area:     14.90 cm LA Vol (A2C): 33.1 ml 19.22 ml/m RA Volume:   39.20 ml  22.76 ml/m  AORTIC VALVE LVOT Vmax:   110.00 cm/s LVOT Vmean:  68.700 cm/s LVOT VTI:    0.132 m  AORTA Ao Root diam: 3.30 cm  SHUNTS Systemic VTI:  0.13 m Systemic  Diam: 2.20 cm Cherlynn Kaiser MD Electronically signed by Cherlynn Kaiser MD Signature Date/Time: 10/22/2019/6:14:52 PM    Final     Cardiac Studies   Echocardiogram 10/22/2019: Impressions: 1. Left ventricular ejection fraction, by estimation, is 65 to 70%. The  left ventricle has normal function. Left ventricular endocardial border  not optimally defined to evaluate regional wall motion. There is mild left  ventricular hypertrophy. Left  ventricular diastolic parameters are indeterminate.  2. Right ventricular systolic function is normal. The right ventricular  size is normal. Tricuspid regurgitation signal is inadequate for assessing  PA pressure.  3. Right atrial size was mildly dilated.  4. The mitral valve is grossly normal. Trivial mitral valve  regurgitation.  5. The aortic valve was not well visualized. Aortic valve regurgitation  is not visualized. No aortic stenosis is present.  6. The inferior vena cava is dilated in size with >50% respiratory  variability, suggesting right atrial pressure of 8 mmHg.   Patient Profile     [name] a 79 y.o.malewith a history ofparoxysmalatrialfibrillation/flutter on Amiodarone and Coumadin, CVA, hypertension, pancreatitis, seizures, and dementia who is being seen for evaluation of atrial fibrillation with RVR at the request of Dr. Avon Gully.  Assessment & Plan    Paroxysmal Atrial Fibrillation/Flutter with RVR - Patient presented in atrial fibrillation with RVR  in setting of pneumonia and bowel obstruction. - Telemetry reviewed and looks like atrial flutter with rates in the 100's to 110's. - Potassium 3.2. Goal >4.0. Primary team supplementing. - Magnesium 1.6 today. Goal >2.0. Primary team supplementing. - TSH normal. - Echo showed LVEF of 60-65% with normal wall motion. -Currently on IV Amiodarone until patient is consistently taking PO medications. - BP soft at times but may be able to add low dose Lopressor.   - Suspect RVR is physiologic response due to underlying illness and suspect rates to improves as pneumonia and bowel obstruction improves. However, atrial flutter may be difficult to rate control. Will discuss options with MD. - On Coumadin at home. INR continues to climb. 3.9 today. Pharmacy following and will start IV Heparin when INR <2 if patient still NPO for bowel obstruction.  Demand Ischemia - High-sensitivity troponin minimally elevated and flat 18 >>22. Not consistent with ACS.  - EKG showed diffuse ST depression when in atrial fibrillation with rates in the 140's. - No angina.  - Suspect demand ischemia in setting of sepsis with acute respiratory failure. No plans for ischemic evaluation at this time.  Sepsis Secondary to Community Acquired Pneumonia - Chest x-ray showedretrocardiac opacity. - Febrile on admission with temp as high as 101.  - WBC peaked at 25 but has normalized today. - Lactic 3.2 >> 3.1 >> 2.1. - Blood/sputum cultures negative so far. - Continue antibiotics per primary team.  Bowel Obstruction - CT concerning for a partial distal small bowel obstruction. - GI has been consulted and plan is for conservative management at this time. Drinking clear fluids at this time.  Hypertension - BP soft at times but stable. - Not currently on any antihypertensives. - Continue to monitor.  Hypokalemia - Potassium 3.2 today. Being repleted.  - Continue to monitor closely.  Hypomagnesemia - Magnesium 1.6 today. Being repleted. - Recheck tomorrow.  Otherwise, per primary team.  For questions or updates, please contact [place] Please consult [email or web address] for contact info under        Signed, Darreld Mclean, PA-C  10/24/2019, 10:30 AM

## 2019-10-24 NOTE — Progress Notes (Signed)
ANTICOAGULATION CONSULT NOTE  Pharmacy Consult:  Heparin Indication: atrial fibrillation  No Known Allergies  Patient Measurements: Height: 6' (182.9 cm) Weight: 55 kg (121 lb 4.1 oz) IBW/kg (Calculated) : 77.6 Heparin Dosing Weight: TBW  Vital Signs: Temp: 99.1 F (37.3 C) (08/04 1237) Temp Source: Oral (08/04 1237) BP: 130/58 (08/04 1237) Pulse Rate: 115 (08/04 1134)  Labs: Recent Labs    10/21/19 1742 10/21/19 1742 10/21/19 1950 10/22/19 0415 10/22/19 0415 10/23/19 0140 10/24/19 0212  HGB  --    < >  --  8.7*   < > 7.8* 8.3*  HCT  --    < >  --  27.8*  --  24.7* 27.1*  PLT  --    < >  --  226  --  231 282  LABPROT 25.3*   < >  --  28.3*  --  32.8* 37.3*  INR 2.4*   < >  --  2.8*  --  3.3* 3.9*  CREATININE  --    < >  --  0.70  --  0.79 0.76  TROPONINIHS 18*  --  22*  --   --   --   --    < > = values in this interval not displayed.    Estimated Creatinine Clearance: 57.3 mL/min (by C-G formula based on SCr of 0.76 mg/dL).  Assessment: 8 YOM presenting generalized fatigue and low grade fever.  Patient has a history of Afib on Coumadin from PTA.  Pharmacy consulted to transition to IV heparin given pSBO and NPO status.  INR trended up to 3.9, so will continue to hold off on starting IV heparin.  No bleeding reported and currently on a clear liquid diet.  Goal of Therapy:  Heparin level 0.3-0.7 units/ml Monitor platelets by anticoagulation protocol: Yes   Plan:  Start heparin gtt when INR < 2 F/u GI plans and ability to restart warfarin  Daniele Yankowski D. Mina Marble, PharmD, BCPS, [place] 10/24/2019, 1:21 PM

## 2019-10-24 NOTE — TOC Initial Note (Addendum)
Transition of Care [facility]) - Initial/Assessment Note    Patient Details  Name: [name] MRN: [id number] Date of Birth: May 29, 1940  Transition of Care [facility]) CM/SW Contact:    Angelita Ingles, RN Phone Number: [phone]  10/24/2019, 2:24 PM  Clinical Narrative:   [facility] consulted for disposition needs. CM spoke with daughter whom patient states that he wants to go live with. Lucy Antigua acknowledges that she is in the process of moving her father into a retirement home in the [place] area where she has secured a room for him. [name] states that this facility has a rehab department and she would like to use the facilities rehab for her father. [name] does not have the details about the facility on her but will call CM back in about an hour to give CM that information. CM will await return call and continue to follow.   [place] Daughter [name] called back with update. She has a bed a [facility] which is an independent retirement community in [place]. In house home health provided by [facility].            Expected Discharge Plan: Skilled Nursing Facility Barriers to Discharge: Continued Medical Work up   Patient Goals and CMS Choice Patient states their goals for this hospitalization and ongoing recovery are:: Wants to go live with his daughter CMS Medicare.gov Compare Post Acute Care list provided to::  (n/a daughter already has patient a room at retirement home) Choice offered to / list presented to : NA  Expected Discharge Plan and Services Expected Discharge Plan: [place] In-house Referral: NA Discharge Planning Services: CM Consult Post Acute Care Choice: NA Living arrangements for the past 2 months: Single Family Home                 DME Arranged: N/A DME Agency: NA       HH Arranged: NA HH Agency: NA        Prior Living Arrangements/Services Living arrangements for the past 2 months: Single Family Home Lives with:: Self Patient  language and need for interpreter reviewed:: Yes Do you feel safe going back to the place where you live?: Yes      Need for Family Participation in Patient Care: Yes (Comment) Care giver support system in place?: Yes (comment) Current home services:  (none per patient) Criminal Activity/Legal Involvement Pertinent to Current Situation/Hospitalization: No - Comment as needed  Activities of Daily Living      Permission Sought/Granted   Permission granted to share information with : No              Emotional Assessment Appearance:: Appears stated age Attitude/Demeanor/Rapport: Other (comment) (a little confused and irritated) Affect (typically observed): Frustrated Orientation: : Oriented to Self, Oriented to Place, Oriented to  Time, Oriented to Situation Alcohol / Substance Use: Not Applicable Psych Involvement: No (comment)  Admission diagnosis:  Abdominal distension [R14.0] Healthcare-associated pneumonia [J18.9] CAP (community acquired pneumonia) [J18.9] Patient Active Problem List   Diagnosis Date Noted  . Elevated troponin   . Hypophosphatemia 10/22/2019  . Atrial fibrillation with RVR ([place])   . Hypomagnesemia 09/12/2019  . Acute blood loss anemia 09/12/2019  . Persistent atrial fibrillation ([place]) 08/22/2019  . Secondary hypercoagulable state ([place]) 08/22/2019  . Sepsis ([place]) 07/21/2019  . Total bilirubin, elevated   . Dilantin toxicity, accidental or unintentional, initial encounter 10/20/2018  . Dilantin overdose, accidental or unintentional, initial encounter 10/20/2018  . GI bleed 07/22/2018  .  Current chronic use of systemic steroids 01/11/2017  . RBBB 01/11/2017  . Polymyalgia ([place]) 01/11/2017  . Fever 01/07/2017  . GI bleeding 01/06/2017  . History of CVA in adulthood 01/06/2017  . Retroperitoneal hematoma 01/06/2017  . Pressure injury of skin 12/13/2016  . Fall   . Closed fracture of femur, intertrochanteric, left, initial encounter ([place]) 12/06/2016   . Severe protein-calorie malnutrition ([place]) 04/03/2016  . Hypotension 04/01/2016  . Leukopenia 03/31/2016  . Elevated INR 03/31/2016  . Hypokalemia 03/31/2016  . Acute pain of right knee 03/01/2016  . CAP (community acquired pneumonia) 12/01/2015  . Aspiration pneumonia ([place]) 12/01/2015  . Erosive gastropathy: Per EGD 11/30/2015 11/30/2015  . Normochromic anemia 11/28/2015  . Localization-related symptomatic epilepsy and epileptic syndromes with complex partial seizures, not intractable, without status epilepticus ([place]) 07/07/2015  . Closed Colles' fracture of right radius with routine healing 05/02/2015  . Pain 04/18/2015  . Dementia (AD) 11/16/2012  . Chronic anticoagulation 06/30/2011  . Paroxysmal atrial flutter ([place]) 06/04/2011  . 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 SINUSITIS, CHRONIC NEC 12/14/2006  . ABUSE, ALCOHOL, CONTINUOUS 10/11/2006  . History of cardiovascular disorder 10/11/2006  . Essential hypertension 09/15/2006  . Stroke ([place]) 09/15/2006  . ALLERGIC RHINITIS 09/15/2006  . CIRRHOSIS, ALCOHOLIC, LIVER 18/40/3754  . Seizure disorder ([place]) 09/15/2006  . PANCREATITIS, HX OF 09/15/2006   PCP:  [name], MD Pharmacy:   [id number] - [place], [address] [address] Phone: [phone] Fax: [phone]     Social Determinants of Health (SDOH) Interventions    Readmission Risk Interventions Readmission Risk Prevention Plan 09/12/2019 07/25/2018  Transportation Screening Complete Complete  PCP or Specialist Appt within 5-7 Days - Complete  PCP or Specialist Appt within 3-5 Days Complete -  Home Care Screening - Complete  Medication Review (RN CM) - Complete  HRI or Home Care Consult Complete -  Social Work Consult for [place] Planning/Counseling Complete -  Palliative Care Screening Not Applicable -  Medication Review (RN Care Manager) Complete -  Some recent  data might be hidden

## 2019-10-24 NOTE — Progress Notes (Signed)
[facility] Progress Note  [name] 79 y.o. [birth year]  CC:  SBO  Subjective: Patient remains confused.  States he wants to be discharged so he can move to [place].  Patient denies abdominal pain.  States he has "never had any problems with my belly."  Denies any nausea or vomiting today.  Tolerated a tray of clears.  Per flow sheet, no BMs since admission.  ROS : Limited by dementia, though patient denies any complaints. Review of Systems  Cardiovascular: Negative for chest pain and palpitations.  Gastrointestinal: Positive for constipation. Negative for abdominal pain, blood in stool, diarrhea, heartburn, melena, nausea and vomiting.  Neurological: Sensory change:     Objective: Vital signs in last 24 hours: Vitals:   10/24/19 0556 10/24/19 0719  BP: (!) 109/51 121/61  Pulse: (!) 117 (!) 112  Resp: 20 19  Temp: 99 F (37.2 C) 99 F (37.2 C)  SpO2: 100% 100%    Physical Exam:  General:  Lethargic, thin, cooperative, confused, elderly, no acute distress  Head:  Normocephalic, without obvious abnormality, atraumatic  Eyes:  Mild conjunctival pallor, EOMs intact  Lungs:   Coarse lung sounds bilaterally, respirations unlabored  Heart:  Irregularly irregular, tachycardic  Abdomen:   Remains distended though bowel sounds have improved and are now normoactive x 34 quadrants, no guarding or peritoneal signs  Extremities: Extremities normal, atraumatic, no  edema  Pulses: 2+ and symmetric    Lab Results: Recent Labs    10/21/19 1607 10/21/19 2339 10/22/19 0415 10/22/19 0415 10/23/19 0140 10/24/19 0212  NA   < >  --  136   < > 139 140  K   < >  --  3.2*   < > 2.8* 3.2*  CL   < >  --  101   < > 102 106  CO2   < >  --  25   < > 24 23  GLUCOSE   < >  --  140*   < > 114* 103*  BUN   < >  --  14   < > 11 11  CREATININE   < >  --  0.70   < > 0.79 0.76  CALCIUM   < >  --  8.3*   < > 7.7* 7.7*  MG   < > 1.5* 1.8  --   --  1.6*  PHOS  --  2.2* 2.1*   --   --   --    < > = values in this interval not displayed.   Recent Labs    10/23/19 0140 10/24/19 0212  AST 34 28  ALT 22 21  ALKPHOS 59 58  BILITOT 1.0 1.4*  PROT 5.4* 5.8*  ALBUMIN 2.6* 2.7*   Recent Labs    10/22/19 0415 10/22/19 0415 10/23/19 0140 10/24/19 0212  WBC 25.1*   < > 11.1* 9.0  NEUTROABS 23.3*  --   --   --   HGB 8.7*   < > 7.8* 8.3*  HCT 27.8*   < > 24.7* 27.1*  MCV 89.7   < > 89.5 89.4  PLT 226   < > 231 282   < > = values in this interval not displayed.   Recent Labs    10/23/19 0140 10/24/19 0212  LABPROT 32.8* 37.3*  INR 3.3* 3.9*   Assessment: Abnormal CT: most consistent with partial SBO -Improved bowel sounds, tolerating clear liquids -No BM since admission -Mg 1.6 and K+ of  3.2 today  Anemia: Hgb 8.3, stable.  No signs of GI bleeding   Pneumonia: fever, cough, possibly from aspiration  A fib, on Coumadin. INR 3.9 as of 8/4  Plan: Supportive care with IVF.  Maintain Magnesium >2 and Potassium >4.  Suppository/enema as ordered by surgical team.  Monitor clinical course.  [facility] will follow.  Salley Slaughter PA-C 10/24/2019, 11:08 AM  Contact #  [phone]

## 2019-10-24 NOTE — Progress Notes (Signed)
ANTICOAGULATION CONSULT NOTE  Pharmacy Consult:  Warfarin Indication: atrial fibrillation  No Known Allergies  Patient Measurements: Height: 6' (182.9 cm) Weight: 55 kg (121 lb 4.1 oz) IBW/kg (Calculated) : 77.6  Vital Signs: Temp: 99.2 F (37.3 C) (08/04 2000) Temp Source: Oral (08/04 2000) BP: 133/59 (08/04 2000) Pulse Rate: 98 (08/04 2000)  Labs: Recent Labs    10/22/19 0415 10/22/19 0415 10/23/19 0140 10/24/19 0212  HGB 8.7*   < > 7.8* 8.3*  HCT 27.8*  --  24.7* 27.1*  PLT 226  --  231 282  LABPROT 28.3*  --  32.8* 37.3*  INR 2.8*  --  3.3* 3.9*  CREATININE 0.70  --  0.79 0.76   < > = values in this interval not displayed.    Estimated Creatinine Clearance: 57.3 mL/min (by C-G formula based on SCr of 0.76 mg/dL).  Assessment: 79 YO M on warfarin PTA for Afib. Warfarin was initially held for partial SBO. Pharmacy consulted to restart warfarin 8/4 after passing swallow evaluation and tolerating  Clears.    INR trended up to 3.9 despite holding warfarin 8/2-8/3. Outpatient INRs in July were either sub or supratherapeutic, likely due to acute illnesses including GIB in June and labile PO intake.   Warfarin dose PTA: 2.5mg  on Mon/Fri, 5mg  all other days  Goal of Therapy:  Heparin level 0.3-0.7 units/ml Monitor platelets by anticoagulation protocol: Yes   Plan:  Hold warfarin tonight given INR 3.9  Monitor daily INR, CBC/plt Monitor for signs/symptoms of bleeding    Benetta Spar, PharmD, BCPS, BCCP Clinical Pharmacist  Please check AMION for all [place] phone numbers After 10:00 PM, call [place]

## 2019-10-24 NOTE — Progress Notes (Signed)
[facility] Progress Note     Subjective: CC:  NAEO. Pleasantly confused - asking to get his paperwork so he can move to [employer]. Denies abdominal pain, chest pain, palpitations. Says he is having flatus. Per RN no BM yesterday  In a.fib/flutter on monitor, rate 114 bpm  Objective: Vital signs in last 24 hours: Temp:  [97.6 F (36.4 C)-99 F (37.2 C)] 99 F (37.2 C) (08/04 0719) Pulse Rate:  [82-117] 112 (08/04 0719) Resp:  [13-20] 19 (08/04 0719) BP: (95-121)/(51-69) 121/61 (08/04 0719) SpO2:  [100 %] 100 % (08/04 0719)    Intake/Output from previous day: 08/03 0701 - 08/04 0700 In: 1184.1 [I.V.:234.1; IV Piggyback:950] Out: 400 [Urine:400] Intake/Output this shift: No intake/output data recorded.  PE: Gen:  Alert, NAD, appears chronically ill  Card:  Irregularly irregular, pedal pulses 2+ BL Pulm:  Normal effort, clear to auscultation bilaterally Abd: Soft, mild to moderate distention, tympanic, non-tender, non-distended,+BS Skin: warm and dry, no rashes  Psych: A&Ox3   Lab Results:  Recent Labs    10/23/19 0140 10/24/19 0212  WBC 11.1* 9.0  HGB 7.8* 8.3*  HCT 24.7* 27.1*  PLT 231 282   BMET Recent Labs    10/23/19 0140 10/24/19 0212  NA 139 140  K 2.8* 3.2*  CL 102 106  CO2 24 23  GLUCOSE 114* 103*  BUN 11 11  CREATININE 0.79 0.76  CALCIUM 7.7* 7.7*   PT/INR Recent Labs    10/23/19 0140 10/24/19 0212  LABPROT 32.8* 37.3*  INR 3.3* 3.9*   CMP     Component Value Date/Time   NA 140 10/24/2019 0212   NA 144 10/07/2015 0000   K 3.2 (L) 10/24/2019 0212   CL 106 10/24/2019 0212   CO2 23 10/24/2019 0212   GLUCOSE 103 (H) 10/24/2019 0212   BUN 11 10/24/2019 0212   BUN 16 10/07/2015 0000   CREATININE 0.76 10/24/2019 0212   CALCIUM 7.7 (L) 10/24/2019 0212   PROT 5.8 (L) 10/24/2019 0212   ALBUMIN 2.7 (L) 10/24/2019 0212   AST 28 10/24/2019 0212   ALT 21 10/24/2019 0212   ALKPHOS 58 10/24/2019 0212   BILITOT 1.4 (H)  10/24/2019 0212   GFRNONAA >60 10/24/2019 0212   GFRAA >60 10/24/2019 0212   Lipase     Component Value Date/Time   LIPASE 26 07/21/2018 0957       Studies/Results: DG Abd 2 Views  Result Date: 10/23/2019 CLINICAL DATA:  Small-bowel obstruction. EXAM: ABDOMEN - 2 VIEW COMPARISON:  A 03/2019 FINDINGS: Dilated large and small bowel loops appear unchanged. Lumbar spondylosis.  Left hip fracture repair. Bibasilar airspace disease.  Small left effusion. IMPRESSION: Dilated large and small bowel loops unchanged. Findings most compatible with ileus versus obstruction. Electronically Signed   By: Franchot Gallo [name]   On: 10/23/2019 10:29   ECHOCARDIOGRAM COMPLETE  Result Date: 10/22/2019    ECHOCARDIOGRAM REPORT   Patient Name:   [name] Date of Exam: 10/22/2019 Medical Rec #:  [id number]            Height:       72.0 in Accession #:    [id number]           Weight:       121.3 lb Date of Birth:  [birth year]             BSA:          1.722 m Patient Age:    79 years  BP:           101/60 mmHg Patient Gender: M                    HR:           119 bpm. Exam Location:  Inpatient Procedure: 2D Echo Indications:    780.2 syncope  History:        Patient has prior history of Echocardiogram examinations, most                 recent 07/24/2019. Stroke, Arrythmias:Atrial Flutter; Risk                 Factors:Diabetes, Hypertension and Former Smoker.  Sonographer:    Jannett Celestine RDCS (AE) Referring Phys: [id number] ANASTASSIA DOUTOVA  Sonographer Comments: Image acquisition challenging due to respiratory motion. IMPRESSIONS  1. Left ventricular ejection fraction, by estimation, is 65 to 70%. The left ventricle has normal function. Left ventricular endocardial border not optimally defined to evaluate regional wall motion. There is mild left ventricular hypertrophy. Left ventricular diastolic parameters are indeterminate.  2. Right ventricular systolic function is normal. The right ventricular size is  normal. Tricuspid regurgitation signal is inadequate for assessing PA pressure.  3. Right atrial size was mildly dilated.  4. The mitral valve is grossly normal. Trivial mitral valve regurgitation.  5. The aortic valve was not well visualized. Aortic valve regurgitation is not visualized. No aortic stenosis is present.  6. The inferior vena cava is dilated in size with >50% respiratory variability, suggesting right atrial pressure of 8 mmHg. FINDINGS  Left Ventricle: Left ventricular ejection fraction, by estimation, is 65 to 70%. The left ventricle has normal function. Left ventricular endocardial border not optimally defined to evaluate regional wall motion. The left ventricular internal cavity size was normal in size. There is mild left ventricular hypertrophy. Left ventricular diastolic parameters are indeterminate. Right Ventricle: The right ventricular size is normal. No increase in right ventricular wall thickness. Right ventricular systolic function is normal. Tricuspid regurgitation signal is inadequate for assessing PA pressure. Left Atrium: Left atrial size was normal in size. Right Atrium: Right atrial size was mildly dilated. Pericardium: Trivial pericardial effusion is present. The pericardial effusion is surrounding the apex. Mitral Valve: The mitral valve is grossly normal. Trivial mitral valve regurgitation. Tricuspid Valve: The tricuspid valve is grossly normal. Tricuspid valve regurgitation is trivial. Aortic Valve: The aortic valve was not well visualized. Aortic valve regurgitation is not visualized. No aortic stenosis is present. Pulmonic Valve: The pulmonic valve was not well visualized. Pulmonic valve regurgitation is not visualized. Aorta: The ascending aorta was not well visualized. Venous: The inferior vena cava was not well visualized. The inferior vena cava is dilated in size with greater than 50% respiratory variability, suggesting right atrial pressure of 8 mmHg. IAS/Shunts: The  interatrial septum was not well visualized.  LEFT VENTRICLE PLAX 2D LVIDd:         2.60 cm LVIDs:         2.00 cm LV PW:         1.20 cm LV IVS:        1.20 cm LVOT diam:     2.20 cm LV SV:         50 LV SV Index:   29 LVOT Area:     3.80 cm  LEFT ATRIUM           Index       RIGHT ATRIUM  Index LA diam:      3.10 cm 1.80 cm/m  RA Area:     14.90 cm LA Vol (A2C): 33.1 ml 19.22 ml/m RA Volume:   39.20 ml  22.76 ml/m  AORTIC VALVE LVOT Vmax:   110.00 cm/s LVOT Vmean:  68.700 cm/s LVOT VTI:    0.132 m  AORTA Ao Root diam: 3.30 cm  SHUNTS Systemic VTI:  0.13 m Systemic Diam: 2.20 cm Cherlynn Kaiser MD Electronically signed by Cherlynn Kaiser MD Signature Date/Time: 10/22/2019/6:14:52 PM    Final     Anti-infectives: Anti-infectives (From admission, onward)   Start     Dose/Rate Route Frequency Ordered Stop   10/22/19 2000  Ampicillin-Sulbactam (UNASYN) 3 g in sodium chloride 0.9 % 100 mL IVPB     Discontinue     3 g 200 mL/hr over 30 Minutes Intravenous Every 8 hours 10/22/19 1849     10/22/19 0200  cefTRIAXone (ROCEPHIN) 2 g in sodium chloride 0.9 % 100 mL IVPB  Status:  Discontinued        2 g 200 mL/hr over 30 Minutes Intravenous Every 24 hours 10/21/19 2044 10/22/19 0010   10/22/19 0030  Ampicillin-Sulbactam (UNASYN) 3 g in sodium chloride 0.9 % 100 mL IVPB  Status:  Discontinued        3 g 200 mL/hr over 30 Minutes Intravenous Every 8 hours 10/22/19 0023 10/22/19 1849   10/21/19 2200  doxycycline (VIBRAMYCIN) 100 mg in sodium chloride 0.9 % 250 mL IVPB     Discontinue     100 mg 125 mL/hr over 120 Minutes Intravenous Every 12 hours 10/21/19 2044     10/21/19 1730  piperacillin-tazobactam (ZOSYN) IVPB 3.375 g        3.375 g 100 mL/hr over 30 Minutes Intravenous  Once 10/21/19 1716 10/21/19 1930   10/21/19 1730  vancomycin (VANCOREADY) IVPB 1250 mg/250 mL        1,250 mg 166.7 mL/hr over 90 Minutes Intravenous  Once 10/21/19 1716 10/21/19 2035     Assessment/Plan A. flutter on  coumadin (INR 3.3) and amio drip Seizure disorder Prior CVA HTN DM2  CAP - Per [facility] -   Supraumbilical hernia - no evidence of incarceration on CT. Does not appear to be the transition point on CT. The hernia is reducible on exam. No indication for emergent or urgent surgery. Follow up outpatient.   SBO vs Ileus - No prior hx of abdominal surgeries - No indication for emergent or urgent surgery at this time - Patient is without abdominal pain, n/v. Increase bowel regimen today -- dulcolax suppository. If that is not successful then give enema. Both are ordered. Spoke to [employer]. - Keep K > 4 and Mg > 2 to encourage bowel function - Mobilize as able for bowel function   FEN - NPO, IVF, replace K (3.2) and Mg (1.6) VTE - SCDs, please hold coumadin, okay for heparin drip ID - Abx for PNA per TR[facility]   LOS: 2 days    Obie Dredge, [facility] Please see Amion for pager number during day hours 7:00am-4:30pm

## 2019-10-24 NOTE — Progress Notes (Signed)
PT refused enema stated "if he cann't eat he cann't Sh## so I don't want an enema".

## 2019-10-24 NOTE — Progress Notes (Signed)
PROGRESS NOTE    [name] [facility]  ZOX:[id number] DOB: 21-Jan-1941 DOA: 10/21/2019 PCP: [name], MD      Brief Narrative:  [name] is a 79 y.o. M with atrial fibrillation on warfarin, history of seizures, dementia,, cirrhosis compensated, CVA, and HTN who presented with generalized malaise, found to have community-acquired pneumonia.  Subsequent hospital stay complicated by ileus.      Assessment & Plan:  Severe sepsis with acute hypoxic respiratory failure due to community-acquired pneumonia Aspiration ruled out.  Presented with leukocytosis, tachycardia, and fever due to infection in the lung, complicated by acute hypoxic respiratory failure. -Continue doxycycline, Unasyn -Continue pulmonary toilet   Ileus Patient noted to have abdominal distention and diarrhea with nausea.  Imaging showed ileus versus partial obstruction.  Clinically it appears to be an ileus, and he is resolving and able to take p.o. -Advance oral intake -Consult general surgery, appreciate recommendations  Alcoholic liver cirrhosis, chronic, stable Compensated  Mild dementia  History of seizures/epilepsy No seizure activity -Continue Dilantin, Keppra  Normocytic anemia Hemoglobin trending down, no clinical bleeding  Paroxysmal atrial flutter Incision amiodarone p.o. -Keep magnesium greater than 2, potassium greater than 4 -Supplement potassium and magnesium -Consult cardiology, appreciate recommendations -Resume warfarin  Hypomagnesemia Hypokalemia -Supplement magnesium and potassium      Disposition: Status is: Inpatient  Remains inpatient appropriate because:IV treatments appropriate due to intensity of illness or inability to take PO   Dispo: The patient is from: Home              Anticipated d/c is to: SNF              Anticipated d/c date is: > 3 days              Patient currently is not medically stable to d/c.              MDM: The below labs and  imaging reports were reviewed and summarized above.  Medication management as above.     DVT prophylaxis: N/a on warfarin  Code Status: FULL Family Communication:     Consultants:   Cardiology  Gen Surg  Procedures:     Antimicrobials:      Culture data:              Subjective: Patient has no complaints, no fever, cough.  No abdominal distention, vomiting, diarrhea.  No hematemesis or melena.  Objective: Vitals:   10/24/19 1134 10/24/19 1237 10/24/19 1600 10/24/19 2000  BP:  (!) 130/58 (!) 128/57 (!) 133/59  Pulse: (!) 115  (!) 103 98  Resp:  (!) 21 19 20   Temp:  99.1 F (37.3 C) 98.1 F (36.7 C) 99.2 F (37.3 C)  TempSrc:  Oral Oral Oral  SpO2:      Weight:      Height:        Intake/Output Summary (Last 24 hours) at 10/24/2019 2005 Last data filed at 10/24/2019 1201 Gross per 24 hour  Intake 770.67 ml  Output --  Net 770.67 ml   Filed Weights   10/21/19 1603  Weight: 55 kg    Examination: General appearance:  adult male, alert and in no acute distress.  [place] HEENT: Anicteric, conjunctiva pink, lids and lashes normal. No nasal deformity, discharge, epistaxis.  Lips moist.   Skin: Warm and dry.  No jaundice.  No suspicious rashes or lesions. Cardiac: RRR, nl S1-S2, no murmurs appreciated.  Capillary refill is brisk.  JVP normal.  No  LE edema.  Radial  pulses 2+ and symmetric. Respiratory: Normal respiratory rate and rhythm.  CTAB without rales or wheezes. Abdomen: Abdomen soft.  No TTP guarding. No ascites, distension, hepatosplenomegaly.   MSK: No deformities or effusions. Neuro: Awake and alert.  EOMI, moves all extremities. Speech fluent.    Psych: Sensorium intact and responding to questions, attention normal. Affect normal.  Judgment and insight appear mildly impaired.    Data Reviewed: I have personally reviewed following labs and imaging studies:  CBC: Recent Labs  Lab 10/19/19 1900 10/21/19 1607 10/22/19 0415  10/23/19 0140 10/24/19 0212  WBC 14.3* 18.1* 25.1* 11.1* 9.0  NEUTROABS 12.3*  --  23.3*  --   --   HGB 9.6* 9.3* 8.7* 7.8* 8.3*  HCT 31.7* 30.5* 27.8* 24.7* 27.1*  MCV 93.0 91.6 89.7 89.5 89.4  PLT 250 316 226 231 824   Basic Metabolic Panel: Recent Labs  Lab 10/19/19 1900 10/21/19 1607 10/21/19 2339 10/22/19 0415 10/23/19 0140 10/24/19 0212  NA 138 135  --  136 139 140  K 4.3 3.4*  --  3.2* 2.8* 3.2*  CL 102 99  --  101 102 106  CO2 28 24  --  25 24 23   GLUCOSE 149* 239*  --  140* 114* 103*  BUN 13 14  --  14 11 11   CREATININE 0.82 0.86  --  0.70 0.79 0.76  CALCIUM 8.7* 8.9  --  8.3* 7.7* 7.7*  MG  --   --  1.5* 1.8  --  1.6*  PHOS  --   --  2.2* 2.1*  --   --    GFR: Estimated Creatinine Clearance: 57.3 mL/min (by C-G formula based on SCr of 0.76 mg/dL). Liver Function Tests: Recent Labs  Lab 10/19/19 1900 10/21/19 1742 10/22/19 0415 10/23/19 0140 10/24/19 0212  AST 36 46* 39 34 28  ALT 21 23 22 22 21   ALKPHOS 93 77 69 59 58  BILITOT 0.5 0.7 0.9 1.0 1.4*  PROT 7.3 7.4 6.2* 5.4* 5.8*  ALBUMIN 3.8 3.6 3.0* 2.6* 2.7*   No results for input(s): LIPASE, AMYLASE in the last 168 hours. No results for input(s): AMMONIA in the last 168 hours. Coagulation Profile: Recent Labs  Lab 10/19/19 1900 10/21/19 1742 10/22/19 0415 10/23/19 0140 10/24/19 0212  INR 3.4* 2.4* 2.8* 3.3* 3.9*   Cardiac Enzymes: No results for input(s): CKTOTAL, CKMB, CKMBINDEX, TROPONINI in the last 168 hours. BNP (last 3 results) No results for input(s): PROBNP in the last 8760 hours. HbA1C: Recent Labs    10/21/19 2339  HGBA1C 4.7*   CBG: Recent Labs  Lab 10/23/19 2316 10/24/19 0302 10/24/19 0718 10/24/19 1229 10/24/19 1604  GLUCAP 95 96 99 164* 148*   Lipid Profile: No results for input(s): CHOL, HDL, LDLCALC, TRIG, CHOLHDL, LDLDIRECT in the last 72 hours. Thyroid Function Tests: Recent Labs    10/21/19 2341  TSH 0.781   Anemia Panel: No results for input(s):  VITAMINB12, FOLATE, FERRITIN, TIBC, IRON, RETICCTPCT in the last 72 hours. Urine analysis:    Component Value Date/Time   COLORURINE YELLOW 10/21/2019 2050   APPEARANCEUR HAZY (A) 10/21/2019 2050   LABSPEC 1.028 10/21/2019 2050   PHURINE 5.0 10/21/2019 2050   GLUCOSEU >=500 (A) 10/21/2019 2050   HGBUR SMALL (A) 10/21/2019 2050   BILIRUBINUR NEGATIVE 10/21/2019 2050   KETONESUR NEGATIVE 10/21/2019 2050   PROTEINUR 100 (A) 10/21/2019 2050   UROBILINOGEN 0.2 06/02/2011 0300   NITRITE NEGATIVE 10/21/2019 2050  LEUKOCYTESUR NEGATIVE 10/21/2019 2050   Sepsis Labs: @LABRCNTIP (procalcitonin:4,lacticacidven:4)  ) Recent Results (from the past 240 hour(s))  SARS Coronavirus 2 by RT PCR (hospital order, performed in [facility] hospital lab) Nasopharyngeal Nasopharyngeal Swab     Status: None   Collection Time: 10/19/19  8:32 PM   Specimen: Nasopharyngeal Swab  Result Value Ref Range Status   SARS Coronavirus 2 NEGATIVE NEGATIVE Final    Comment: (NOTE) SARS-CoV-2 target nucleic acids are NOT DETECTED.  The SARS-CoV-2 RNA is generally detectable in upper and lower respiratory specimens during the acute phase of infection. The lowest concentration of SARS-CoV-2 viral copies this assay can detect is 250 copies / mL. A negative result does not preclude SARS-CoV-2 infection and should not be used as the sole basis for treatment or other patient management decisions.  A negative result may occur with improper specimen collection / handling, submission of specimen other than nasopharyngeal swab, presence of viral mutation(s) within the areas targeted by this assay, and inadequate number of viral copies (<250 copies / mL). A negative result must be combined with clinical observations, patient history, and epidemiological information.  Fact Sheet for Patients:   StrictlyIdeas.no  Fact Sheet for Healthcare  Providers: BankingDealers.co.za  This test is not yet approved or  cleared by the [place] FDA and has been authorized for detection and/or diagnosis of SARS-CoV-2 by FDA under an Emergency Use Authorization (EUA).  This EUA will remain in effect (meaning this test can be used) for the duration of the COVID-19 declaration under Section 564(b)(1) of the Act, 21 U.S.C. section 360bbb-3(b)(1), unless the authorization is terminated or revoked sooner.  Performed at [facility], [place] [address] [place], [place] [address]   Urine culture     Status: None   Collection Time: 10/19/19  9:50 PM   Specimen: In/Out Cath Urine  Result Value Ref Range Status   Specimen Description IN/OUT CATH URINE  Final   Special Requests NONE  Final   Culture   Final    NO GROWTH Performed at [facility], [place] [address], [place], [place] [address]    Report Status 10/21/2019 FINAL  Final  Blood culture (routine x 2)     Status: None (Preliminary result)   Collection Time: 10/21/19  5:43 PM   Specimen: BLOOD  Result Value Ref Range Status   Specimen Description BLOOD SITE NOT SPECIFIED  Final   Special Requests   Final    BOTTLES DRAWN AEROBIC AND ANAEROBIC Blood Culture adequate volume   Culture   Final    NO GROWTH 3 DAYS Performed at [facility], [place] [address], [place], [place] [address]    Report Status PENDING  Incomplete  Blood culture (routine x 2)     Status: None (Preliminary result)   Collection Time: 10/21/19  6:11 PM   Specimen: BLOOD LEFT FOREARM  Result Value Ref Range Status   Specimen Description BLOOD LEFT FOREARM  Final   Special Requests   Final    BOTTLES DRAWN AEROBIC AND ANAEROBIC Blood Culture results may not be optimal due to an inadequate volume of blood received in culture bottles   Culture   Final    NO GROWTH 3 DAYS Performed at [facility], [place] [address], [place],  [address]    Report Status PENDING   Incomplete  Urine culture     Status: Abnormal   Collection Time: 10/21/19  8:50 PM   Specimen:  Urine, Clean Catch  Result Value Ref Range Status   Specimen Description URINE, CLEAN CATCH  Final   Special Requests NONE  Final   Culture (A)  Final    <10,000 COLONIES/mL INSIGNIFICANT GROWTH Performed at [facility], [address] [place], [place] [address]    Report Status 10/22/2019 FINAL  Final  Respiratory Panel by PCR     Status: None   Collection Time: 10/21/19  9:27 PM   Specimen: Nasopharyngeal Swab; Respiratory  Result Value Ref Range Status   Adenovirus NOT DETECTED NOT DETECTED Final   Coronavirus 229E NOT DETECTED NOT DETECTED Final    Comment: (NOTE) The Coronavirus on the Respiratory Panel, DOES NOT test for the novel  Coronavirus (2019 nCoV)    Coronavirus HKU1 NOT DETECTED NOT DETECTED Final   Coronavirus NL63 NOT DETECTED NOT DETECTED Final   Coronavirus OC43 NOT DETECTED NOT DETECTED Final   Metapneumovirus NOT DETECTED NOT DETECTED Final   Rhinovirus / Enterovirus NOT DETECTED NOT DETECTED Final   Influenza A NOT DETECTED NOT DETECTED Final   Influenza B NOT DETECTED NOT DETECTED Final   Parainfluenza Virus 1 NOT DETECTED NOT DETECTED Final   Parainfluenza Virus 2 NOT DETECTED NOT DETECTED Final   Parainfluenza Virus 3 NOT DETECTED NOT DETECTED Final   Parainfluenza Virus 4 NOT DETECTED NOT DETECTED Final   Respiratory Syncytial Virus NOT DETECTED NOT DETECTED Final   Bordetella pertussis NOT DETECTED NOT DETECTED Final   Chlamydophila pneumoniae NOT DETECTED NOT DETECTED Final   Mycoplasma pneumoniae NOT DETECTED NOT DETECTED Final    Comment: Performed at [facility], [place]. [address], [place], [place] [address]  Culture, sputum-assessment     Status: None   Collection Time: 10/21/19 11:44 PM   Specimen: Sputum  Result Value Ref Range Status   Specimen Description SPUTUM  Final   Special Requests COLLECTED IN THE ER  Final   Sputum  evaluation   Final    THIS SPECIMEN IS ACCEPTABLE FOR SPUTUM CULTURE Performed at [facility], [address], [place], [place] [address]    Report Status 10/22/2019 FINAL  Final  Culture, respiratory     Status: None   Collection Time: 10/21/19 11:44 PM   Specimen: SPU  Result Value Ref Range Status   Specimen Description SPUTUM  Final   Special Requests COLLECTED IN THE ER Reflexed from [id number]  Final   Gram Stain   Final    RARE WBC PRESENT,BOTH PMN AND MONONUCLEAR FEW GRAM POSITIVE COCCI IN PAIRS FEW GRAM VARIABLE ROD RARE BUDDING YEAST SEEN    Culture   Final    RARE Consistent with normal respiratory flora. Performed at [facility], [place] [address] [place], [place] [address]    Report Status 10/24/2019 FINAL  Final  MRSA PCR Screening     Status: None   Collection Time: 10/22/19 12:43 AM   Specimen: Nasal Mucosa; Nasopharyngeal  Result Value Ref Range Status   MRSA by PCR NEGATIVE NEGATIVE Final    Comment:        The GeneXpert MRSA Assay (FDA approved for NASAL specimens only), is one component of a comprehensive MRSA colonization surveillance program. It is not intended to diagnose MRSA infection nor to guide or monitor treatment for MRSA infections. Performed at [facility], [place] [address], [place], [place] [address]          Radiology Studies: DG Abd 2 Views  Result Date:  10/23/2019 CLINICAL DATA:  Small-bowel obstruction. EXAM: ABDOMEN - 2 VIEW COMPARISON:  A 03/2019 FINDINGS: Dilated large and small bowel loops appear unchanged. Lumbar spondylosis.  Left hip fracture repair. Bibasilar airspace disease.  Small left effusion. IMPRESSION: Dilated large and small bowel loops unchanged. Findings most compatible with ileus versus obstruction. Electronically Signed   By: Franchot Gallo [name]   On: 10/23/2019 10:29        Scheduled Meds: . amiodarone  200 mg Oral BID  . bisacodyl  10 mg Rectal Once  . docusate sodium  100 mg Oral BID  .  doxycycline  100 mg Oral Q12H  . insulin aspart  0-9 Units Subcutaneous Q4H  . levETIRAcetam  1,500 mg Oral BID  . pantoprazole (PROTONIX) IV  40 mg Intravenous Q12H  . phenytoin  100 mg Oral BID  . polyethylene glycol  17 g Oral Daily  . sodium chloride flush  3 mL Intravenous Q12H  . sodium phosphate  1 enema Rectal Once   Continuous Infusions: . ampicillin-sulbactam (UNASYN) IV 3 g (10/24/19 1953)  . potassium chloride 10 mEq (10/24/19 1950)     LOS: 2 days    Time spent: 35 minutes and anticoagulation managed    Edwin Dada, MD [facility] 10/24/2019, 8:05 PM     Please page though [place] or Epic secure chat:  For [employer], Adult nurse

## 2019-10-24 NOTE — Progress Notes (Signed)
Physical Therapy Treatment Patient Details Name: [name] MRN: [id number] DOB: [birth year] Today's Date: 10/24/2019    History of Present Illness Pt is an 79 y/o male admitted secondary to generalized weakness and fever. Thought to be from CAP. Pt also with A fib and possible bowel obstruction vs. ileus. PMH includes HTN, seizures, dementia, a fib, and CVA.     PT Comments    Patient was received laying in hospital bed. At rest, HR was 115. Monitored HR throughout session to ensure it stayed beneath max HR of 140. When attempting to transfer to EOB, noticed his bed was soiled, called Nurse Tech to help change sheets, gown, and catheter. Patient was a MaxA with rolling and required verbal and tactile cueing for hand placement. Therapeutic exercises: straight leg raises and ankle pumps x 20. He was left laying in bed with Nurse Tech present.    Follow Up Recommendations  Other (comment);Supervision/Assistance - 24 hour     Equipment Recommendations  None recommended by PT    Recommendations for Other Services       Precautions / Restrictions Precautions Precautions: Fall;Other (comment) Precaution Comments: watch HR  Restrictions Weight Bearing Restrictions: No    Mobility  Bed Mobility Overal bed mobility: Needs Assistance Bed Mobility: Rolling Rolling: Max assist         General bed mobility comments: MaxA for rolling, verbal and tactile cueing for hand placement.  Transfers                    Ambulation/Gait                 Stairs             Wheelchair Mobility    Modified Rankin (Stroke Patients Only)       Balance       Sitting balance - Comments: not able to assess balance today                                    Cognition Arousal/Alertness: Awake/alert Behavior During Therapy: Flat affect Overall Cognitive Status: History of cognitive impairments - at baseline Area of Impairment:  Orientation;Attention;Following commands;Memory;Safety/judgement;Awareness;Problem solving                   Current Attention Level: Sustained Memory: Decreased short-term memory Following Commands: Follows one step commands with increased time;Follows multi-step commands inconsistently Safety/Judgement: Decreased awareness of safety;Decreased awareness of deficits Awareness: Emergent Problem Solving: Difficulty sequencing;Requires verbal cues;Slow processing        Exercises      General Comments        Pertinent Vitals/Pain Pain Assessment: No/denies pain Pain Score: 0-No pain Faces Pain Scale: No hurt Pain Intervention(s): Monitored during session    Home Living                      Prior Function            PT Goals (current goals can now be found in the care plan section) Acute Rehab PT Goals Patient Stated Goal: for shoulder to stop hurting PT Goal Formulation: With patient Time For Goal Achievement: 11/05/19 Potential to Achieve Goals: Fair Progress towards PT goals: Progressing toward goals    Frequency    Min 3X/week      PT Plan Current plan remains appropriate    Co-evaluation  AM-PAC PT "6 Clicks" Mobility   Outcome Measure  Help needed turning from your back to your side while in a flat bed without using bedrails?: A Lot Help needed moving from lying on your back to sitting on the side of a flat bed without using bedrails?: A Lot Help needed moving to and from a bed to a chair (including a wheelchair)?: A Lot Help needed standing up from a chair using your arms (e.g., wheelchair or bedside chair)?: A Lot Help needed to walk in hospital room?: A Lot Help needed climbing 3-5 steps with a railing? : Total 6 Click Score: 11    End of Session     Patient left: in bed;with call bell/phone within reach;with bed alarm set Nurse Communication: Other (comment) (communicated with nurse tech about soiled bed.) PT  Visit Diagnosis: Unsteadiness on feet (R26.81);Muscle weakness (generalized) (M62.81);Difficulty in walking, not elsewhere classified (R26.2)     Time:  -     Charges:                        Livingston Diones, SPT, ATC

## 2019-10-24 NOTE — Evaluation (Signed)
Clinical/Bedside Swallow Evaluation Patient Details  Name: [name] MRN: [id number] Date of Birth: 06/27/1940  Today's Date: 10/24/2019 Time: SLP Start Time (ACUTE ONLY): 3664 SLP Stop Time (ACUTE ONLY): 1419 SLP Time Calculation (min) (ACUTE ONLY): 12 min  Past Medical History:  Past Medical History:  Diagnosis Date  . Diabetes mellitus without complication ([place])   . Hiatal hernia   . HTN (hypertension)    pt denies h/o HTN though it appears he was started on spironolactone during his hospitalization 3/13  . Pancreatitis   . Seizures ([place])   . Stroke ([place])   . Typical atrial flutter ([place]) 3/13   Past Surgical History:  Past Surgical History:  Procedure Laterality Date  . ESOPHAGOGASTRODUODENOSCOPY N/A 11/30/2015   Procedure: ESOPHAGOGASTRODUODENOSCOPY (EGD);  Surgeon: Wonda Horner, MD;  [name] ENDOSCOPY;  Service: Endoscopy;  Laterality: N/A;  . ESOPHAGOGASTRODUODENOSCOPY N/A 09/11/2019   Procedure: ESOPHAGOGASTRODUODENOSCOPY (EGD);  Surgeon: Wonda Horner, MD;  Location: [facility] ENDOSCOPY;  Service: Endoscopy;  Laterality: N/A;  . ESOPHAGOGASTRODUODENOSCOPY (EGD) WITH PROPOFOL N/A 07/23/2018   Procedure: ESOPHAGOGASTRODUODENOSCOPY (EGD) WITH PROPOFOL;  Surgeon: Otis Brace, MD;  Location: [facility] ENDOSCOPY;  Service: Gastroenterology;  Laterality: N/A;  . FEMUR IM NAIL Left 12/08/2016  . FEMUR IM NAIL Left 12/07/2016   Procedure: INTRAMEDULLARY (IM) NAIL FEMORAL;  Surgeon: Renette Butters, MD;  Location: [facility];  Service: Orthopedics;  Laterality: Left;  . GIVENS CAPSULE STUDY N/A 07/24/2018   Procedure: GIVENS CAPSULE STUDY;  Surgeon: Otis Brace, MD;  Location: [facility];  Service: Gastroenterology;  Laterality: N/A;  . HIP SURGERY Left 01/2017  . HOT HEMOSTASIS N/A 07/23/2018   Procedure: HOT HEMOSTASIS (ARGON PLASMA COAGULATION/BICAP);  Surgeon: Otis Brace, MD;  Location: [facility] ENDOSCOPY;  Service: Gastroenterology;  Laterality: N/A;  . HOT HEMOSTASIS N/A  09/11/2019   Procedure: HOT HEMOSTASIS (ARGON PLASMA COAGULATION/BICAP);  Surgeon: Wonda Horner, MD;  Location: [facility] ENDOSCOPY;  Service: Endoscopy;  Laterality: N/A;  . no surgical history  06/2011   HPI:  Pt is an 79 yo male admitted secondary to generalized weakness and fever. Pt also with A fib bowel obstruction vs. ileus. PMH includes HTN, hiatal hernia, eizures, dementia, a fib, and CVA. CT abdomen also revealed airspace consolidation in both lung bases may represent pneumonia or aspiration. BSE 07/2019 reg/thin recommended.    Assessment / Plan / Recommendation Clinical Impression  Signs of aspiration and pharyngeal dysphagia evident during BSE. Vocal quality is wet, numerous swallows following small sips, audible swallows which appear uncoordinated. Suspect laryngeal/cervical esophageal/esophageal deficits. He is unreliable source of past and current swallow status and is on clear liquids due to bowel obstruction. Recommend MBS tomorrow and will follow up with GI to ensure he can have barium given bowel obstruction. Sit upright, small sips, crush pills and MBS if able.         SLP Visit Diagnosis: Dysphagia, unspecified (R13.10)    Aspiration Risk  Moderate aspiration risk;Severe aspiration risk    Diet Recommendation Thin liquid;Other (Comment) (clear liquids)   Liquid Administration via: Cup Medication Administration: Crushed with puree Supervision: Patient able to self feed Compensations: Slow rate;Small sips/bites;Minimize environmental distractions Postural Changes: Seated upright at 90 degrees    Other  Recommendations Oral Care Recommendations: Oral care BID   Follow up Recommendations  (TBD)      Frequency and Duration min 2x/week  2 weeks       Prognosis Prognosis for Safe Diet Advancement:  (fair-good) Barriers to Reach Goals: Cognitive deficits  Swallow Study   General HPI: Pt is an 79 yo male admitted secondary to generalized weakness and fever. Pt also  with A fib bowel obstruction vs. ileus. PMH includes HTN, hiatal hernia, eizures, dementia, a fib, and CVA. CT abdomen also revealed airspace consolidation in both lung bases may represent pneumonia or aspiration. BSE 07/2019 reg/thin recommended.  Type of Study: Bedside Swallow Evaluation Previous Swallow Assessment:  (see HPI) Diet Prior to this Study: Thin liquids;Other (Comment) (clear liquids ) Temperature Spikes Noted: No Respiratory Status: Room air History of Recent Intubation: No Behavior/Cognition: Alert;Cooperative;Pleasant mood;Confused;Requires cueing Oral Cavity Assessment: Within Functional Limits Oral Care Completed by SLP: No Vision: Functional for self-feeding Self-Feeding Abilities: Able to feed self Patient Positioning: Upright in bed Baseline Vocal Quality: Wet Volitional Cough: Strong Volitional Swallow: Able to elicit    Oral/Motor/Sensory Function Overall Oral Motor/Sensory Function: Within functional limits   Ice Chips Ice chips: Not tested   Thin Liquid Thin Liquid: Impaired Presentation: Cup;Straw Oral Phase Impairments:  (none) Pharyngeal  Phase Impairments: Decreased hyoid-laryngeal movement;Multiple swallows;Wet Vocal Quality;Cough - Immediate;Cough - Delayed    Nectar Thick Nectar Thick Liquid: Not tested   Honey Thick Honey Thick Liquid: Not tested   Puree Puree:  (pt declined)   Solid     Solid: Not tested (on clear liquids)      Mick Sell, Orbie Pyo 10/24/2019,3:06 PM   Orbie Pyo Colvin Caroli.Ed Risk analyst [phone] Office [phone]

## 2019-10-25 ENCOUNTER — Inpatient Hospital Stay (HOSPITAL_COMMUNITY): Payer: Medicare Other

## 2019-10-25 DIAGNOSIS — R791 Abnormal coagulation profile: Secondary | ICD-10-CM

## 2019-10-25 LAB — GLUCOSE, CAPILLARY
Glucose-Capillary: 102 mg/dL — ABNORMAL HIGH (ref 70–99)
Glucose-Capillary: 114 mg/dL — ABNORMAL HIGH (ref 70–99)
Glucose-Capillary: 118 mg/dL — ABNORMAL HIGH (ref 70–99)
Glucose-Capillary: 123 mg/dL — ABNORMAL HIGH (ref 70–99)
Glucose-Capillary: 134 mg/dL — ABNORMAL HIGH (ref 70–99)
Glucose-Capillary: 141 mg/dL — ABNORMAL HIGH (ref 70–99)
Glucose-Capillary: 171 mg/dL — ABNORMAL HIGH (ref 70–99)
Glucose-Capillary: 86 mg/dL (ref 70–99)

## 2019-10-25 LAB — CBC
HCT: 24.5 % — ABNORMAL LOW (ref 39.0–52.0)
Hemoglobin: 7.5 g/dL — ABNORMAL LOW (ref 13.0–17.0)
MCH: 27.3 pg (ref 26.0–34.0)
MCHC: 30.6 g/dL (ref 30.0–36.0)
MCV: 89.1 fL (ref 80.0–100.0)
Platelets: 308 10*3/uL (ref 150–400)
RBC: 2.75 MIL/uL — ABNORMAL LOW (ref 4.22–5.81)
RDW: 20.1 % — ABNORMAL HIGH (ref 11.5–15.5)
WBC: 9.4 10*3/uL (ref 4.0–10.5)
nRBC: 0.3 % — ABNORMAL HIGH (ref 0.0–0.2)

## 2019-10-25 LAB — COMPREHENSIVE METABOLIC PANEL
ALT: 21 U/L (ref 0–44)
AST: 24 U/L (ref 15–41)
Albumin: 2.5 g/dL — ABNORMAL LOW (ref 3.5–5.0)
Alkaline Phosphatase: 52 U/L (ref 38–126)
Anion gap: 8 (ref 5–15)
BUN: 5 mg/dL — ABNORMAL LOW (ref 8–23)
CO2: 27 mmol/L (ref 22–32)
Calcium: 7.5 mg/dL — ABNORMAL LOW (ref 8.9–10.3)
Chloride: 104 mmol/L (ref 98–111)
Creatinine, Ser: 0.65 mg/dL (ref 0.61–1.24)
GFR calc Af Amer: >60 mL/min (ref >60)
GFR calc non Af Amer: >60 mL/min (ref >60)
Glucose, Bld: 119 mg/dL — ABNORMAL HIGH (ref 70–99)
Potassium: 3.2 mmol/L — ABNORMAL LOW (ref 3.5–5.1)
Sodium: 139 mmol/L (ref 135–145)
Total Bilirubin: 0.9 mg/dL (ref 0.3–1.2)
Total Protein: 5.6 g/dL — ABNORMAL LOW (ref 6.5–8.1)

## 2019-10-25 LAB — PROTIME-INR
INR: 6 (ref 0.8–1.2)
Prothrombin Time: 52.2 seconds — ABNORMAL HIGH (ref 11.4–15.2)

## 2019-10-25 MED ORDER — POTASSIUM CHLORIDE CRYS ER 20 MEQ PO TBCR
40.0000 meq | EXTENDED_RELEASE_TABLET | Freq: Two times a day (BID) | ORAL | Status: AC
  Administered 2019-10-25 (×2): 40 meq via ORAL
  Filled 2019-10-25 (×2): qty 2

## 2019-10-25 MED ORDER — ADULT MULTIVITAMIN W/MINERALS CH
1.0000 | ORAL_TABLET | Freq: Every day | ORAL | Status: DC
  Administered 2019-10-26 – 2019-10-31 (×6): 1 via ORAL
  Filled 2019-10-25 (×7): qty 1

## 2019-10-25 MED ORDER — FOLIC ACID 1 MG PO TABS
1.0000 mg | ORAL_TABLET | Freq: Every day | ORAL | Status: DC
  Administered 2019-10-26 – 2019-10-31 (×6): 1 mg via ORAL
  Filled 2019-10-25 (×7): qty 1

## 2019-10-25 MED ORDER — AMIODARONE HCL 200 MG PO TABS
200.0000 mg | ORAL_TABLET | Freq: Every day | ORAL | Status: DC
  Administered 2019-10-26 – 2019-10-31 (×6): 200 mg via ORAL
  Filled 2019-10-25 (×6): qty 1

## 2019-10-25 MED ORDER — STARCH (THICKENING) PO POWD
ORAL | Status: DC | PRN
  Filled 2019-10-25: qty 227

## 2019-10-25 MED ORDER — RESOURCE THICKENUP CLEAR PO POWD
ORAL | Status: DC | PRN
  Filled 2019-10-25: qty 125

## 2019-10-25 MED ORDER — PANTOPRAZOLE SODIUM 40 MG PO TBEC
40.0000 mg | DELAYED_RELEASE_TABLET | Freq: Every day | ORAL | Status: DC
  Filled 2019-10-25: qty 1

## 2019-10-25 MED ORDER — PHYTONADIONE 5 MG PO TABS
2.5000 mg | ORAL_TABLET | Freq: Once | ORAL | Status: AC
  Administered 2019-10-25: 2.5 mg via ORAL
  Filled 2019-10-25: qty 1

## 2019-10-25 MED ORDER — PANTOPRAZOLE SODIUM 40 MG IV SOLR
40.0000 mg | Freq: Two times a day (BID) | INTRAVENOUS | Status: DC
  Administered 2019-10-25 – 2019-10-31 (×13): 40 mg via INTRAVENOUS
  Filled 2019-10-25 (×13): qty 40

## 2019-10-25 MED ORDER — ENSURE ENLIVE PO LIQD
237.0000 mL | Freq: Three times a day (TID) | ORAL | Status: DC
  Administered 2019-10-26 – 2019-10-29 (×5): 237 mL via ORAL

## 2019-10-25 MED ORDER — VITAMIN B-12 100 MCG PO TABS
100.0000 ug | ORAL_TABLET | Freq: Every day | ORAL | Status: DC
  Administered 2019-10-25 – 2019-10-31 (×7): 100 ug via ORAL
  Filled 2019-10-25 (×7): qty 1

## 2019-10-25 MED ORDER — FERROUS SULFATE 325 (65 FE) MG PO TABS
325.0000 mg | ORAL_TABLET | Freq: Two times a day (BID) | ORAL | Status: DC
  Administered 2019-10-25 – 2019-10-31 (×13): 325 mg via ORAL
  Filled 2019-10-25 (×12): qty 1

## 2019-10-25 NOTE — Progress Notes (Signed)
Occupational Therapy Treatment Patient Details Name: [name] MRN: [id number] DOB: 1940-05-06 Today's Date: 10/25/2019    History of present illness Pt is an 79 y/o male admitted secondary to generalized weakness and fever. Thought to be from CAP. Pt also with A fib and possible bowel obstruction vs. ileus (that is resolving). PMH includes HTN, seizures, dementia, a fib, and CVA.   OT comments  Pt without awareness of soiled bed and contributing his difficulty standing and ambulating to severe R shoulder pain. Pt needing mod to max assist for all mobility and demonstrated poor sitting and standing balance. Pt preferring to return to bed at end of session. Heat applied to R shoulder and asked RN for pain meds and to order thickener for drinks. Pt is not functioning at an independent level for d/c to ILF. Will continue to follow.  Follow Up Recommendations  Supervision/Assistance - 24 hour;Home health OT (daughter is declining SNF, prefers pt go home with her)    Equipment Recommendations  Other (comment) (defer to next venue)    Recommendations for Other Services      Precautions / Restrictions Precautions Precautions: Fall Precaution Comments: watch HR        Mobility Bed Mobility Overal bed mobility: Needs Assistance Bed Mobility: Supine to Sit;Sit to Supine     Supine to sit: Mod assist Sit to supine: Mod assist   General bed mobility comments: Increased time, cues for sequencing and scooting forward; assist of 2 for safety  Transfers Overall transfer level: Needs assistance Equipment used: Rolling walker (2 wheeled) Transfers: Sit to/from [facility] Sit to Stand: Mod assist;+2 safety/equipment;Max assist Stand pivot transfers: +2 physical assistance;Mod assist       General transfer comment: Mod A to stand from elevated bed and Max A from chair; Had assist of 2 for safety; increased time and multimodal cues for safe hand placement.     Balance Overall balance assessment: Needs assistance Sitting-balance support: Bilateral upper extremity supported;Feet supported Sitting balance-Leahy Scale: Poor Sitting balance - Comments: Pt sat EOB for ~5 minutes.  Tending to sway in all directions but strong lean posteriorly at times.  Reports limited due to shoulder pain.  Requiring min guard to mod A while sitting Postural control: Posterior lean Standing balance support: Bilateral upper extremity supported Standing balance-Leahy Scale: Poor Standing balance comment: Requiring min-mod A for balance, posterior bias                           ADL either performed or assessed with clinical judgement   ADL Overall ADL's : Needs assistance/impaired                                       General ADL Comments: pt with urine stained bed pad without awareness     Vision       Perception     Praxis      Cognition Arousal/Alertness: Awake/alert Behavior During Therapy: WFL for tasks assessed/performed Overall Cognitive Status: Impaired/Different from baseline Area of Impairment: Safety/judgement;Following commands;Problem solving;Memory                 Orientation Level: Time;Situation Current Attention Level: Sustained Memory: Decreased short-term memory Following Commands: Follows one step commands with increased time;Follows multi-step commands inconsistently Safety/Judgement: Decreased awareness of safety;Decreased awareness of deficits Awareness: Emergent Problem Solving: Difficulty sequencing;Requires verbal cues;Slow  processing General Comments: Dementia at baseline, impairments in problem solving and awareness of deficits. Pt focused on pain in R shoulder, unaware of deficits as pt reports he can walk now if he wanted too        Exercises     Shoulder Instructions       General Comments Pt on 2 LPM O2 with stable sats.  HR 107 bpm max.  BP stable.  DId note that pt's INR was 6.0  this morning - spoke with RN who reports pt ok for therapy.  Cotx with OT for safety.    Pertinent Vitals/ Pain       Pain Assessment: Faces Faces Pain Scale: Hurts whole lot Pain Location: R shoulder, back Pain Descriptors / Indicators: Sore;Discomfort;Grimacing;Guarding Pain Intervention(s): Heat applied;Patient requesting pain meds-RN notified;Repositioned  Home Living                                          Prior Functioning/Environment              Frequency  Min 2X/week        Progress Toward Goals  OT Goals(current goals can now be found in the care plan section)  Progress towards OT goals: Not progressing toward goals - comment (pain limiting)  Acute Rehab OT Goals Patient Stated Goal: for shoulder to stop hurting OT Goal Formulation: With patient Time For Goal Achievement: 11/06/19 Potential to Achieve Goals: Good  Plan Discharge plan remains appropriate    Co-evaluation    PT/OT/SLP Co-Evaluation/Treatment: Yes Reason for Co-Treatment: For patient/therapist safety;Necessary to address cognition/behavior during functional activity PT goals addressed during session: Mobility/safety with mobility;Balance OT goals addressed during session: ADL's and self-care;Proper use of [employer] equipment and DME      AM-PAC OT "6 Clicks" Daily Activity     Outcome Measure   Help from another person eating meals?: A Little Help from another person taking care of personal grooming?: A Little Help from another person toileting, which includes using toliet, bedpan, or urinal?: Total Help from another person bathing (including washing, rinsing, drying)?: A Lot Help from another person to put on and taking off regular upper body clothing?: A Lot Help from another person to put on and taking off regular lower body clothing?: A Lot 6 Click Score: 13    End of Session Equipment Utilized During Treatment: Rolling walker;Oxygen  OT Visit Diagnosis:  Unsteadiness on feet (R26.81);Other abnormalities of gait and mobility (R26.89);Muscle weakness (generalized) (M62.81);History of falling (Z91.81)   Activity Tolerance Patient limited by pain   Patient Left in bed;with call bell/phone within reach;with bed alarm set   Nurse Communication Patient requests pain meds (needs thickener)        Time: 5885-0277 OT Time Calculation (min): 31 min  Charges: OT General Charges $OT Visit: 1 Visit OT Treatments $Therapeutic Activity: 8-22 mins  Nestor Lewandowsky, OTR/L Acute Rehabilitation Services Pager: [phone] Office: [phone]   Malka So 10/25/2019, 4:08 PM

## 2019-10-25 NOTE — Progress Notes (Signed)
Physical Therapy Treatment Patient Details Name: [name] MRN: [id number] DOB: 08/02/1940 Today's Date: 10/25/2019    History of Present Illness Pt is an 79 y/o male admitted secondary to generalized weakness and fever. Thought to be from CAP. Pt also with A fib and possible bowel obstruction vs. ileus (that is resolving). PMH includes HTN, seizures, dementia, a fib, and CVA.    PT Comments    Pt making gradual progress - able to progress to OOB.  However, required multimodal cues and assist of 2 for safety.  Pt took a few steps to chair and then back to bed.  HR was stable throughout treatment.  Pt has decreased awareness of safety and deficits - states "I could walk if my shoulder didn't hurt."  Due to the need for mod-max A for transfers and with poor balance - recommend SNF at this time.  Spoke with case manager earlier who reports daughter had got pt a bed and ILF with in house therapy - from PT perspective, pt is not safe for that lower level of care at this time; needs SNF.    Follow Up Recommendations  SNF     Equipment Recommendations  None recommended by PT    Recommendations for Other Services       Precautions / Restrictions Precautions Precautions: Fall;Other (comment) Precaution Comments: watch HR     Mobility  Bed Mobility Overal bed mobility: Needs Assistance Bed Mobility: Supine to Sit;Sit to Supine     Supine to sit: Mod assist Sit to supine: Mod assist   General bed mobility comments: Increased time, cues for sequencing and scooting forward; assist of 2 for safety  Transfers Overall transfer level: Needs assistance Equipment used: Rolling walker (2 wheeled) Transfers: Sit to/from [facility] Sit to Stand: Mod assist;+2 safety/equipment;Max assist         General transfer comment: Mod A to stand from elevated bed and Max A from chair; Had assist of 2 for safety; increased time and multimodal cues for safe hand  placement.  Ambulation/Gait Ambulation/Gait assistance: +2 safety/equipment;Mod assist Gait Distance (Feet): 2 Feet (x2) Assistive device: Rolling walker (2 wheeled) Gait Pattern/deviations: Step-to pattern;Decreased stride length;Shuffle;Leaning posteriorly Gait velocity: decreased   General Gait Details: Steps to chair and back to bed only; poor balance requiring mod A of 2 for safety; cues for RW and assist to weight shift   Stairs             Wheelchair Mobility    Modified Rankin (Stroke Patients Only)       Balance Overall balance assessment: Needs assistance Sitting-balance support: Bilateral upper extremity supported;Feet supported Sitting balance-Leahy Scale: Poor Sitting balance - Comments: Pt sat EOB for ~5 minutes.  Tending to sway in all directions but strong lean posteriorly at times.  Reports limited due to shoulder pain.  Requiring min guard to mod A while sitting Postural control: Posterior lean Standing balance support: Bilateral upper extremity supported Standing balance-Leahy Scale: Poor Standing balance comment: Requiring min-mod A for balance                            Cognition Arousal/Alertness: Awake/alert Behavior During Therapy: WFL for tasks assessed/performed Overall Cognitive Status: History of cognitive impairments - at baseline                                 General  Comments: Dementia at baseline, impairments in problem solving and awareness of deficits. Pt focused on pain in R shoulder, unaware of deficits as pt reports he can walk now if he wanted too      Exercises      General Comments General comments (skin integrity, edema, etc.): Pt on 2 LPM O2 with stable sats.  HR 107 bpm max.  BP stable.  DId note that pt's INR was 6.0 this morning - spoke with RN who reports pt ok for therapy.  Cotx with OT for safety.      Pertinent Vitals/Pain Pain Assessment: Faces Faces Pain Scale: Hurts whole lot Pain  Location: R shoulder, back Pain Descriptors / Indicators: Sore;Discomfort;Grimacing;Guarding Pain Intervention(s): Limited activity within patient's tolerance;Repositioned;Relaxation;Monitored during session    Home Living                      Prior Function            PT Goals (current goals can now be found in the care plan section) Acute Rehab PT Goals Patient Stated Goal: for shoulder to stop hurting PT Goal Formulation: With patient Time For Goal Achievement: 11/05/19 Potential to Achieve Goals: Fair Progress towards PT goals: Progressing toward goals    Frequency    Min 2X/week      PT Plan Discharge plan needs to be updated;Frequency needs to be updated    Co-evaluation PT/OT/SLP Co-Evaluation/Treatment: Yes Reason for Co-Treatment: Complexity of the patient's impairments (multi-system involvement);For patient/therapist safety PT goals addressed during session: Mobility/safety with mobility;Balance OT goals addressed during session: ADL's and self-care      AM-PAC PT "6 Clicks" Mobility   Outcome Measure  Help needed turning from your back to your side while in a flat bed without using bedrails?: A Lot Help needed moving from lying on your back to sitting on the side of a flat bed without using bedrails?: A Lot Help needed moving to and from a bed to a chair (including a wheelchair)?: A Lot Help needed standing up from a chair using your arms (e.g., wheelchair or bedside chair)?: A Lot Help needed to walk in hospital room?: Total Help needed climbing 3-5 steps with a railing? : Total 6 Click Score: 10    End of Session   Activity Tolerance: Patient limited by pain;Other (comment) (limited by pain and elevated INR) Patient left: in bed;with call bell/phone within reach;with bed alarm set Nurse Communication: Mobility status PT Visit Diagnosis: Unsteadiness on feet (R26.81);Muscle weakness (generalized) (M62.81);Difficulty in walking, not elsewhere  classified (R26.2)     Time: 3559-7416 PT Time Calculation (min) (ACUTE ONLY): 23 min  Charges:  $Therapeutic Activity: 8-22 mins                     Abran Richard, PT Acute Rehab Services Pager [phone] [facility] 10/25/2019, 3:21 PM

## 2019-10-25 NOTE — Progress Notes (Signed)
  Speech Language Pathology Patient Details Name: [name] MRN: [id number] DOB: [birth year] Today's Date: 10/25/2019 Time:  -      MBS scheduled today at 10:30                Houston Siren 10/25/2019, 8:56 AM   Orbie Pyo Colvin Caroli.Ed Risk analyst [phone] Office [phone]

## 2019-10-25 NOTE — Progress Notes (Signed)
PROGRESS NOTE    [facility]  HWE:[id number] DOB: Jan 14, 1941 DOA: 10/21/2019 PCP: [name], MD      Brief Narrative:  Mr. Janoski is a 79 y.o. M with atrial fibrillation on warfarin, history of seizures, dementia,, cirrhosis compensated, CVA, and HTN who presented with generalized malaise, found to have community-acquired pneumonia.  Subsequent hospital stay complicated by ileus.      Assessment & Plan:  Severe sepsis with acute hypoxic respiratory failure due to community-acquired pneumonia Presented with leukocytosis, tachycardia, and fever due to infection in the lung, complicated by acute hypoxic respiratory failure.  Aspiration likely.   Patient now mentating at baseline, taking orals.  Temp < 100 F, heart rate < 100bpm, RR < 24, SpO2 at baseline.      -Continue doxycycline, Unasyn day 5 -Continue pulmonary toilet   Ileus Patient noted to have abdominal distention and diarrhea with nausea.  Imaging showed ileus versus partial obstruction.  Clinically it appeared to be an ileus, now resolved    Alcoholic liver cirrhosis, chronic, stable Compensated  Mild dementia  History of seizures/epilepsy No seizure activity -Continue Dilantin, Keppra  Normocytic anemia Hemoglobin trending down, no clinical bleeding  Iron studies normal in June.  B12 and folate replete. -Trend CBC  Supratherapeutic INR No clinical bleeding unfortunately vitamin K was given overnight. -Check INR tomorrow -Continue warfarin, per protocol  Paroxysmal atrial fibrillation Rate controlled. -Continue amiodarone -Keep magnesium greater than 2, potassium greater than 4 -Supplement potassium and magnesium -Consult cardiology, appreciate recommendations -Continue warfarin, per protocol, monitor INR  Hypomagnesemia Hypokalemia -Continue supplementing potassium   Dysphagia Dysphagia 2 diet High risk for aspiration, multifactorial, most likely due to deconditioning from acute  illness as well as baseline dementia and moderate protein calorie malnutrition  Moderate protein calorie malnutrition        Disposition: Status is: Inpatient  Remains inpatient appropriate because:IV treatments appropriate due to intensity of illness or inability to take PO   Dispo: The patient is from: Home              Anticipated d/c is to: SNF              Anticipated d/c date is: Likely tomorrow              Patient currently is not medically stable to d/c.              MDM: The below labs and imaging reports were reviewed and summarized above.  Medication management as above.     DVT prophylaxis: N/a on warfarin  Code Status: FULL Family Communication: Daughter by phone   Consultants:   Cardiology  Gen Surg            Subjective: He is tolerating oral diet, but frequently coughing. Speech recommended dysphagia 2 diet. No fever. Voice sounds froggy, no cough. No vomiting, confusion.  Objective: Vitals:   10/25/19 0000 10/25/19 0400 10/25/19 0800 10/25/19 1600  BP: (!) 147/63 137/60 132/60 (!) 146/74  Pulse: 82 90 91 96  Resp: 19 (!) 21 (!) 21 19  Temp: 98.7 F (37.1 C) 98.3 F (36.8 C) 98.6 F (37 C) 98.2 F (36.8 C)  TempSrc: Oral Oral Oral Oral  SpO2: 100% 97% 99% 100%  Weight:      Height:        Intake/Output Summary (Last 24 hours) at 10/25/2019 1603 Last data filed at 10/25/2019 0730 Gross per 24 hour  Intake 640 ml  Output 950 ml  Net -310 ml   Filed Weights   10/21/19 1603  Weight: 55 kg    Examination: General appearance: Thin elderly male, lying in bed, no acute distress     HEENT: Anicteric, conjunctival pink, lids and lashes normal. No nasal forming, discharge, or epistaxis. Skin:  Cardiac: Irregularly irregular, no JVD, no lower extremity edema Respiratory: Respiratory rate normal, diminished bilateral bases, no rales or wheezing Abdomen: Abdomen soft no tenderness palpation or guarding MSK: Diffuse loss  of subcutaneous muscle mass and fat Neuro: Extraocular movements intact, moves all extremities with generalized weakness, but symmetric strength, speech fluent Psych: Attention diminished, affect blunted, judgment insight appear moderately impaired       Data Reviewed: I have personally reviewed following labs and imaging studies:  CBC: Recent Labs  Lab 10/19/19 1900 10/19/19 1900 10/21/19 1607 10/22/19 0415 10/23/19 0140 10/24/19 0212 10/25/19 0107  WBC 14.3*   < > 18.1* 25.1* 11.1* 9.0 9.4  NEUTROABS 12.3*  --   --  23.3*  --   --   --   HGB 9.6*   < > 9.3* 8.7* 7.8* 8.3* 7.5*  HCT 31.7*   < > 30.5* 27.8* 24.7* 27.1* 24.5*  MCV 93.0   < > 91.6 89.7 89.5 89.4 89.1  PLT 250   < > 316 226 231 282 308   < > = values in this interval not displayed.   Basic Metabolic Panel: Recent Labs  Lab 10/21/19 1607 10/21/19 2339 10/22/19 0415 10/23/19 0140 10/24/19 0212 10/25/19 0107  NA 135  --  136 139 140 139  K 3.4*  --  3.2* 2.8* 3.2* 3.2*  CL 99  --  101 102 106 104  CO2 24  --  25 24 23 27   GLUCOSE 239*  --  140* 114* 103* 119*  BUN 14  --  14 11 11  5*  CREATININE 0.86  --  0.70 0.79 0.76 0.65  CALCIUM 8.9  --  8.3* 7.7* 7.7* 7.5*  MG  --  1.5* 1.8  --  1.6*  --   PHOS  --  2.2* 2.1*  --   --   --    GFR: Estimated Creatinine Clearance: 57.3 mL/min (by C-G formula based on SCr of 0.65 mg/dL). Liver Function Tests: Recent Labs  Lab 10/21/19 1742 10/22/19 0415 10/23/19 0140 10/24/19 0212 10/25/19 0107  AST 46* 39 34 28 24  ALT 23 22 22 21 21   ALKPHOS 77 69 59 58 52  BILITOT 0.7 0.9 1.0 1.4* 0.9  PROT 7.4 6.2* 5.4* 5.8* 5.6*  ALBUMIN 3.6 3.0* 2.6* 2.7* 2.5*   No results for input(s): LIPASE, AMYLASE in the last 168 hours. No results for input(s): AMMONIA in the last 168 hours. Coagulation Profile: Recent Labs  Lab 10/21/19 1742 10/22/19 0415 10/23/19 0140 10/24/19 0212 10/25/19 0107  INR 2.4* 2.8* 3.3* 3.9* 6.0*   Cardiac Enzymes: No results for  input(s): CKTOTAL, CKMB, CKMBINDEX, TROPONINI in the last 168 hours. BNP (last 3 results) No results for input(s): PROBNP in the last 8760 hours. HbA1C: No results for input(s): HGBA1C in the last 72 hours. CBG: Recent Labs  Lab 10/25/19 0344 10/25/19 0547 10/25/19 0829 10/25/19 1206 10/25/19 1600  GLUCAP 86 114* 123* 134* 102*   Lipid Profile: No results for input(s): CHOL, HDL, LDLCALC, TRIG, CHOLHDL, LDLDIRECT in the last 72 hours. Thyroid Function Tests: No results for input(s): TSH, T4TOTAL, FREET4, T3FREE, THYROIDAB in the last 72 hours. Anemia Panel: No results for input(s):  VITAMINB12, FOLATE, FERRITIN, TIBC, IRON, RETICCTPCT in the last 72 hours. Urine analysis:    Component Value Date/Time   COLORURINE YELLOW 10/21/2019 2050   APPEARANCEUR HAZY (A) 10/21/2019 2050   LABSPEC 1.028 10/21/2019 2050   PHURINE 5.0 10/21/2019 2050   GLUCOSEU >=500 (A) 10/21/2019 2050   HGBUR SMALL (A) 10/21/2019 2050   [place] NEGATIVE 10/21/2019 2050   [place] 10/21/2019 2050   PROTEINUR 100 (A) 10/21/2019 2050   UROBILINOGEN 0.2 06/02/2011 0300   NITRITE NEGATIVE 10/21/2019 2050   LEUKOCYTESUR NEGATIVE 10/21/2019 2050   Sepsis Labs: @LABRCNTIP (procalcitonin:4,lacticacidven:4)  ) Recent Results (from the past 240 hour(s))  SARS Coronavirus 2 by RT PCR (hospital order, performed in [place] hospital lab) Nasopharyngeal Nasopharyngeal Swab     Status: None   Collection Time: 10/19/19  8:32 PM   Specimen: Nasopharyngeal Swab  Result Value Ref Range Status   SARS Coronavirus 2 NEGATIVE NEGATIVE Final    Comment: (NOTE) SARS-CoV-2 target nucleic acids are NOT DETECTED.  The SARS-CoV-2 RNA is generally detectable in upper and lower respiratory specimens during the acute phase of infection. The lowest concentration of SARS-CoV-2 viral copies this assay can detect is 250 copies / mL. A negative result does not preclude SARS-CoV-2 infection and should not be used as  the sole basis for treatment or other patient management decisions.  A negative result may occur with improper specimen collection / handling, submission of specimen other than nasopharyngeal swab, presence of viral mutation(s) within the areas targeted by this assay, and inadequate number of viral copies (<250 copies / mL). A negative result must be combined with clinical observations, patient history, and epidemiological information.  Fact Sheet for Patients:   StrictlyIdeas.no  Fact Sheet for Healthcare Providers: BankingDealers.co.za  This test is not yet approved or  cleared by the [place] FDA and has been authorized for detection and/or diagnosis of SARS-CoV-2 by FDA under an Emergency Use Authorization (EUA).  This EUA will remain in effect (meaning this test can be used) for the duration of the COVID-19 declaration under Section 564(b)(1) of the Act, 21 U.S.C. section 360bbb-3(b)(1), unless the authorization is terminated or revoked sooner.  Performed at [facility], [place] [address], [place], [place] [address]   Urine culture     Status: None   Collection Time: 10/19/19  9:50 PM   Specimen: In/Out Cath Urine  Result Value Ref Range Status   Specimen Description IN/OUT CATH URINE  Final   Special Requests NONE  Final   Culture   Final    NO GROWTH Performed at [facility], [place] [address] [place], [place] [address]    Report Status 10/21/2019 FINAL  Final  Blood culture (routine x 2)     Status: None (Preliminary result)   Collection Time: 10/21/19  5:43 PM   Specimen: BLOOD  Result Value Ref Range Status   Specimen Description BLOOD SITE NOT SPECIFIED  Final   Special Requests   Final    BOTTLES DRAWN AEROBIC AND ANAEROBIC Blood Culture adequate volume   Culture   Final    NO GROWTH 4 DAYS Performed at [facility], [place] [address] [place], [place] [address]    Report Status PENDING   Incomplete  Blood culture (routine x 2)     Status: None (Preliminary result)   Collection Time: 10/21/19  6:11 PM   Specimen: BLOOD LEFT FOREARM  Result Value Ref Range Status   Specimen Description BLOOD  LEFT FOREARM  Final   Special Requests   Final    BOTTLES DRAWN AEROBIC AND ANAEROBIC Blood Culture results may not be optimal due to an inadequate volume of blood received in culture bottles   Culture   Final    NO GROWTH 4 DAYS Performed at [facility], [place] [address], [place], [place] [address]    Report Status PENDING  Incomplete  Urine culture     Status: Abnormal   Collection Time: 10/21/19  8:50 PM   Specimen: Urine, Clean Catch  Result Value Ref Range Status   Specimen Description URINE, CLEAN CATCH  Final   Special Requests NONE  Final   Culture (A)  Final    <10,000 COLONIES/mL INSIGNIFICANT GROWTH Performed at [facility], [place] [address] [place], [place] [address]    Report Status 10/22/2019 FINAL  Final  Respiratory Panel by PCR     Status: None   Collection Time: 10/21/19  9:27 PM   Specimen: Nasopharyngeal Swab; Respiratory  Result Value Ref Range Status   Adenovirus NOT DETECTED NOT DETECTED Final   Coronavirus 229E NOT DETECTED NOT DETECTED Final    Comment: (NOTE) The Coronavirus on the Respiratory Panel, DOES NOT test for the novel  Coronavirus (2019 nCoV)    Coronavirus HKU1 NOT DETECTED NOT DETECTED Final   Coronavirus NL63 NOT DETECTED NOT DETECTED Final   Coronavirus OC43 NOT DETECTED NOT DETECTED Final   Metapneumovirus NOT DETECTED NOT DETECTED Final   Rhinovirus / Enterovirus NOT DETECTED NOT DETECTED Final   Influenza A NOT DETECTED NOT DETECTED Final   Influenza B NOT DETECTED NOT DETECTED Final   Parainfluenza Virus 1 NOT DETECTED NOT DETECTED Final   Parainfluenza Virus 2 NOT DETECTED NOT DETECTED Final   Parainfluenza Virus 3 NOT DETECTED NOT DETECTED Final   Parainfluenza Virus 4 NOT DETECTED NOT DETECTED Final   Respiratory  Syncytial Virus NOT DETECTED NOT DETECTED Final   Bordetella pertussis NOT DETECTED NOT DETECTED Final   Chlamydophila pneumoniae NOT DETECTED NOT DETECTED Final   Mycoplasma pneumoniae NOT DETECTED NOT DETECTED Final    Comment: Performed at [facility], [place]. [address] [place], [place] [address]  Culture, sputum-assessment     Status: None   Collection Time: 10/21/19 11:44 PM   Specimen: Sputum  Result Value Ref Range Status   Specimen Description SPUTUM  Final   Special Requests COLLECTED IN THE ER  Final   Sputum evaluation   Final    THIS SPECIMEN IS ACCEPTABLE FOR SPUTUM CULTURE Performed at [facility], [address] [place], [place] [address]    Report Status 10/22/2019 FINAL  Final  Culture, respiratory     Status: None   Collection Time: 10/21/19 11:44 PM   Specimen: SPU  Result Value Ref Range Status   Specimen Description SPUTUM  Final   Special Requests COLLECTED IN THE ER Reflexed from [id number]  Final   Gram Stain   Final    RARE WBC PRESENT,BOTH PMN AND MONONUCLEAR FEW GRAM POSITIVE COCCI IN PAIRS FEW GRAM VARIABLE ROD RARE BUDDING YEAST SEEN    Culture   Final    RARE Consistent with normal respiratory flora. Performed at [facility], [place] [address] [place], [place] [address]    Report Status 10/24/2019 FINAL  Final  MRSA PCR Screening     Status: None   Collection Time: 10/22/19 12:43 AM   Specimen: Nasal Mucosa; Nasopharyngeal  Result  Value Ref Range Status   MRSA by PCR NEGATIVE NEGATIVE Final    Comment:        The GeneXpert MRSA Assay (FDA approved for NASAL specimens only), is one component of a comprehensive MRSA colonization surveillance program. It is not intended to diagnose MRSA infection nor to guide or monitor treatment for MRSA infections. Performed at [facility], [place] [address] [place], [place] [address]          Radiology Studies: DG Abd 2 Views  Result Date: 10/25/2019 CLINICAL DATA:  SBO  follow-up EXAM: ABDOMEN - 2 VIEW COMPARISON:  10/23/2019 FINDINGS: There is residual contrast within stomach and distal small bowel from recent swallow study. Air-filled, dilated loops large and small bowel again identified. Appearance is not substantially changed. No free air. IMPRESSION: Mildly dilated, air-filled loops colon and distal small bowel likely reflecting ileus or obstruction. Appearance is similar to the prior study. Electronically Signed   By: Macy Mis [name]   On: 10/25/2019 14:17   DG Swallowing Func-Speech Pathology  Result Date: 10/25/2019 Objective Swallowing Evaluation: Type of Study: MBS-Modified Barium Swallow Study  Patient Details Name: Eion Timbrook MRN: [id number] Date of Birth: April 09, 1940 Today's Date: 10/25/2019 Time: SLP Start Time (ACUTE ONLY): 4496 -SLP Stop Time (ACUTE ONLY): 1059 SLP Time Calculation (min) (ACUTE ONLY): 24 min Past Medical History: Past Medical History: Diagnosis Date . Diabetes mellitus without complication ([place])  . Hiatal hernia  . HTN (hypertension)   pt denies h/o HTN though it appears he was started on spironolactone during his hospitalization 3/13 . Pancreatitis  . Seizures ([place])  . Stroke ([place])  . Typical atrial flutter ([place]) 3/13 Past Surgical History: Past Surgical History: Procedure Laterality Date . ESOPHAGOGASTRODUODENOSCOPY N/A 11/30/2015  Procedure: ESOPHAGOGASTRODUODENOSCOPY (EGD);  Surgeon: Wonda Horner, MD;  Location: [facility] ENDOSCOPY;  Service: Endoscopy;  Laterality: N/A; . ESOPHAGOGASTRODUODENOSCOPY N/A 09/11/2019  Procedure: ESOPHAGOGASTRODUODENOSCOPY (EGD);  Surgeon: Wonda Horner, MD;  Location: [facility] ENDOSCOPY;  Service: Endoscopy;  Laterality: N/A; . ESOPHAGOGASTRODUODENOSCOPY (EGD) WITH PROPOFOL N/A 07/23/2018  Procedure: ESOPHAGOGASTRODUODENOSCOPY (EGD) WITH PROPOFOL;  Surgeon: Otis Brace, MD;  Location: [facility] ENDOSCOPY;  Service: Gastroenterology;  Laterality: N/A; . FEMUR IM NAIL Left 12/08/2016 . FEMUR IM NAIL Left 12/07/2016   Procedure: INTRAMEDULLARY (IM) NAIL FEMORAL;  Surgeon: Renette Butters, MD;  Location: [place];  Service: Orthopedics;  Laterality: Left; . GIVENS CAPSULE STUDY N/A 07/24/2018  Procedure: GIVENS CAPSULE STUDY;  Surgeon: Otis Brace, MD;  Location: [place];  Service: Gastroenterology;  Laterality: N/A; . HIP SURGERY Left 01/2017 . HOT HEMOSTASIS N/A 07/23/2018  Procedure: HOT HEMOSTASIS (ARGON PLASMA COAGULATION/BICAP);  Surgeon: Otis Brace, MD;  Location: [facility] ENDOSCOPY;  Service: Gastroenterology;  Laterality: N/A; . HOT HEMOSTASIS N/A 09/11/2019  Procedure: HOT HEMOSTASIS (ARGON PLASMA COAGULATION/BICAP);  Surgeon: Wonda Horner, MD;  Location: [name] ENDOSCOPY;  Service: Endoscopy;  Laterality: N/A; . no surgical history  06/2011 HPI: Pt is an 79 yo male admitted secondary to generalized weakness and fever. Pt also with A fib bowel obstruction vs. ileus. PMH includes HTN, hiatal hernia, eizures, dementia, a fib, and CVA. CT abdomen also revealed airspace consolidation in both lung bases may represent pneumonia or aspiration. BSE 07/2019 reg/thin recommended.  No data recorded Assessment / Plan / Recommendation CHL IP CLINICAL IMPRESSIONS 10/25/2019 Clinical Impression Pt has baseline cervical bony protrusion that appears fused however pharyneal space is adequate and epiglottis is able to invert without obstruction. The contraction of pharynx and tongue base is intermittently  reduced which in combination with osteophytes, results in consistent vallecular and pyriform sinuses residue. One episode of barium backflow toward (not reaching) his upper nasopharynx. Laryngeal penetration present with honey, nectar and thin barium and silent aspiration with thin and nectar (< 10% trials). Increased residue and potential for aspiration with residue from honey consistency. Pt was instructed on chin tuck and earlier in study, chair placed in posterior position with pt reclined to assist with propulsion however there was  no significant difference. Pt admitted with pneumonia and unfortunately remains at increased risk. If pt's medical status improves and he gains strength it is hopeful his swallow ability may return to baseline. Findings discussed with MD and decision to continue po's with Dys 2/nectar thick liquids, crush pills, upright position, small sips/bites, multiple swallow, hard coughs and full supervision.      SLP Visit Diagnosis Dysphagia, pharyngeal phase (R13.13) Attention and concentration deficit following -- Frontal lobe and executive function deficit following -- Impact on safety and function Severe aspiration risk;Moderate aspiration risk   CHL IP TREATMENT RECOMMENDATION 10/25/2019 Treatment Recommendations Therapy as outlined in treatment plan below   Prognosis 10/25/2019 Prognosis for Safe Diet Advancement Fair Barriers to Reach Goals Cognitive deficits;Other (Comment) Barriers/Prognosis Comment -- CHL IP DIET RECOMMENDATION 10/25/2019 SLP Diet Recommendations Dysphagia 2 (Fine chop) solids;Nectar thick liquid Liquid Administration via Cup Medication Administration Crushed with puree Compensations Minimize environmental distractions;Clear throat intermittently;Monitor for anterior loss;Small sips/bites;Slow rate;Other (Comment) Postural Changes Seated upright at 90 degrees;Remain semi-upright after after feeds/meals (Comment)   CHL IP OTHER RECOMMENDATIONS 10/25/2019 Recommended Consults -- Oral Care Recommendations Oral care BID Other Recommendations Order thickener from pharmacy   CHL IP FOLLOW UP RECOMMENDATIONS 10/25/2019 Follow up Recommendations Skilled Nursing facility   [facility] IP FREQUENCY AND DURATION 10/25/2019 Speech Therapy Frequency (ACUTE ONLY) min 2x/week Treatment Duration 2 weeks      CHL IP ORAL PHASE 10/25/2019 Oral Phase WFL Oral - Pudding Teaspoon -- Oral - Pudding Cup -- Oral - Honey Teaspoon -- Oral - Honey Cup -- Oral - Nectar Teaspoon -- Oral - Nectar Cup -- Oral - Nectar Straw -- Oral - Thin Teaspoon --  Oral - Thin Cup -- Oral - Thin Straw -- Oral - Puree -- Oral - Mech Soft -- Oral - Regular -- Oral - Multi-Consistency -- Oral - Pill -- Oral Phase - Comment --  CHL IP PHARYNGEAL PHASE 10/25/2019 Pharyngeal Phase Impaired Pharyngeal- Pudding Teaspoon -- Pharyngeal -- Pharyngeal- Pudding Cup -- Pharyngeal -- Pharyngeal- Honey Teaspoon -- Pharyngeal -- Pharyngeal- Honey Cup Penetration/Aspiration during swallow;Pharyngeal residue - valleculae;Reduced pharyngeal peristalsis;Pharyngeal residue - pyriform Pharyngeal Material enters airway, remains ABOVE vocal cords and not ejected out Pharyngeal- Nectar Teaspoon Pharyngeal residue - pyriform;Pharyngeal residue - valleculae Pharyngeal -- Pharyngeal- Nectar Cup Pharyngeal residue - valleculae;Pharyngeal residue - pyriform;Penetration/Aspiration during swallow;Reduced pharyngeal peristalsis Pharyngeal Material enters airway, passes BELOW cords without attempt by patient to eject out (silent aspiration);Material enters airway, remains ABOVE vocal cords and not ejected out Pharyngeal- Nectar Straw -- Pharyngeal -- Pharyngeal- Thin Teaspoon -- Pharyngeal -- Pharyngeal- Thin Cup Penetration/Aspiration during swallow;Pharyngeal residue - valleculae;Pharyngeal residue - pyriform Pharyngeal Material enters airway, CONTACTS cords and not ejected out;Material enters airway, passes BELOW cords without attempt by patient to eject out (silent aspiration) Pharyngeal- Thin Straw -- Pharyngeal -- Pharyngeal- Puree Pharyngeal residue - valleculae;Pharyngeal residue - pyriform Pharyngeal -- Pharyngeal- Mechanical Soft -- Pharyngeal -- Pharyngeal- Regular -- Pharyngeal -- Pharyngeal- Multi-consistency -- Pharyngeal -- Pharyngeal- Pill -- Pharyngeal -- Pharyngeal Comment --  CHL IP CERVICAL ESOPHAGEAL PHASE 10/25/2019 Cervical Esophageal Phase WFL Pudding Teaspoon -- Pudding Cup -- Honey Teaspoon -- Honey Cup -- Nectar Teaspoon -- Nectar Cup -- Nectar Straw -- Thin Teaspoon -- Thin Cup -- Thin  Straw -- Puree -- Mechanical Soft -- Regular -- Multi-consistency -- Pill -- Cervical Esophageal Comment -- Houston Siren 10/25/2019, 12:08 PM Orbie Pyo Colvin Caroli.Ed Actor Pager [phone] Office [phone]                   Scheduled Meds: . [START ON 10/26/2019] amiodarone  200 mg Oral Daily  . bisacodyl  10 mg Rectal Once  . docusate sodium  100 mg Oral BID  . doxycycline  100 mg Oral Q12H  . feeding supplement (ENSURE ENLIVE)  237 mL Oral TID BM  . ferrous sulfate  325 mg Oral BID WC  . folic acid  1 mg Oral Daily  . insulin aspart  0-9 Units Subcutaneous Q4H  . levETIRAcetam  1,500 mg Oral BID  . multivitamin with minerals  1 tablet Oral Daily  . pantoprazole (PROTONIX) IV  40 mg Intravenous Q12H  . phenytoin  100 mg Oral BID  . polyethylene glycol  17 g Oral Daily  . potassium chloride  40 mEq Oral BID  . sodium chloride flush  3 mL Intravenous Q12H  . sodium phosphate  1 enema Rectal Once  . vitamin B-12  100 mcg Oral Daily  . Warfarin - Pharmacist Dosing Inpatient   Does not apply q1600   Continuous Infusions: . ampicillin-sulbactam (UNASYN) IV 3 g (10/25/19 1317)     LOS: 3 days    Time spent: 35 minutes    Edwin Dada, MD [facility] 10/25/2019, 4:03 PM     Please page though [place] or Epic secure chat:  For [employer], Adult nurse

## 2019-10-25 NOTE — Progress Notes (Signed)
ANTICOAGULATION CONSULT NOTE  Pharmacy Consult:  Warfarin Indication: atrial fibrillation  No Known Allergies  Patient Measurements: Height: 6' (182.9 cm) Weight: 55 kg (121 lb 4.1 oz) IBW/kg (Calculated) : 77.6  Vital Signs: Temp: 98.6 F (37 C) (08/05 0800) Temp Source: Oral (08/05 0800) BP: 132/60 (08/05 0800) Pulse Rate: 91 (08/05 0800)  Labs: Recent Labs    10/23/19 0140 10/23/19 0140 10/24/19 0212 10/25/19 0107  HGB 7.8*   < > 8.3* 7.5*  HCT 24.7*  --  27.1* 24.5*  PLT 231  --  282 308  LABPROT 32.8*  --  37.3* 52.2*  INR 3.3*  --  3.9* 6.0*  CREATININE 0.79  --  0.76 0.65   < > = values in this interval not displayed.    Estimated Creatinine Clearance: 57.3 mL/min (by C-G formula based on SCr of 0.65 mg/dL).  Assessment: 79 YO M on warfarin PTA for Afib. Warfarin was initially held for partial SBO. Pharmacy consulted to restart warfarin 8/4 after passing swallow evaluation and tolerating  Clears.    INR has trended up to 6 (no bleeding) despite holding warfarin - given Vitamin K 2.5mg  po x1. Outpatient INRs in July were either sub or supratherapeutic, likely due to acute illnesses including GIB in June and labile PO intake.   Warfarin dose PTA: 2.5mg  on Mon/Fri, 5mg  all other days  Goal of Therapy:  Heparin level 0.3-0.7 units/ml Monitor platelets by anticoagulation protocol: Yes   Plan:  Hold warfarin tonight given INR 6 *F/up INR in AM s/p vitamin K administartion Monitor daily INR, CBC/plt Monitor for signs/symptoms of bleeding   Sloan Leiter, PharmD, BCPS, BCCCP Clinical Pharmacist Please check AMION for all [place] phone numbers After 10:00 PM, call [place] [phone] 10/25/2019, 12:02 PM

## 2019-10-25 NOTE — Progress Notes (Addendum)
Critical result: INR 6.0- Dr. Tonie Griffith notified. Will continue to assess.   No bleeding noted. Patient is sleeping at this time. Pharmacy notified and agreed to give vitamin K that Dr. Tonie Griffith ordered. Will continue to assess.

## 2019-10-25 NOTE — TOC Progression Note (Signed)
Transition of Care [facility]) - Progression Note    Patient Details  Name: [name] MRN: [id number] Date of Birth: December 14, 1940  Transition of Care [name]) CM/SW [place], RN Phone Number: 10/25/2019, 3:23 PM  Clinical Narrative:    Follow up recommendation from PT continues to recommend SNF. CM called [name] and the facility confirmed that they are independent living only. CM called patients daughter [name] to discuss discharge plan. CM made daughter aware of the recommended level of SNF or 24 hour care. Daughter states that she understands but she wants to take her dad home under her care where she will take him home and then to the independent living with private sitters and home health. Daughter states that he is always weak when he first gets out of the hospital but once she gets him home and moving he will be fine. Daughter does not want patient to go to SNF. CM will continue to follow  Expected Discharge Plan: [place] Barriers to Discharge: Continued Medical Work up  Expected Discharge Plan and Services Expected Discharge Plan: [place] In-house Referral: NA Discharge Planning Services: CM Consult Post Acute Care Choice: NA Living arrangements for the past 2 months: Single Family Home                 DME Arranged: N/A DME Agency: NA       HH Arranged: NA HH Agency: NA         Social Determinants of Health (SDOH) Interventions    Readmission Risk Interventions Readmission Risk Prevention Plan 09/12/2019 07/25/2018  Transportation Screening Complete Complete  PCP or Specialist Appt within 5-7 Days - Complete  PCP or Specialist Appt within 3-5 Days Complete -  Home Care Screening - Complete  Medication Review (RN CM) - Complete  HRI or Home Care Consult Complete -  Social Work Consult for [place] Planning/Counseling Complete -  Palliative Care Screening Not Applicable -  Medication Review Press photographer)  Complete -  Some recent data might be hidden

## 2019-10-25 NOTE — Progress Notes (Addendum)
[facility] Progress Note  Per Beagley Bratcher 79 y.o. 1940-11-15  CC:  SBO vs. Ileus   Subjective: Patient remains confused.  States he can't find his second cell phone.  Patient denies abdominal pain, nausea, or vomiting.  States "there was never any problem."  Per flow sheet, he had a small liquid stool this morning, as well as two large bowel movement last night (one black)  ROS : Limited by dementia, though patient denies any complaints.  Objective: Vital signs in last 24 hours: Vitals:   10/25/19 0400 10/25/19 0800  BP: 137/60 132/60  Pulse: 90 91  Resp: (!) 21 (!) 21  Temp: 98.3 F (36.8 C) 98.6 F (37 C)  SpO2: 97% 99%    Physical Exam:  General:  Lethargic, thin, cooperative, confused, elderly, no acute distress  Head:  Normocephalic, without obvious abnormality, atraumatic  Eyes:  Mild conjunctival pallor, EOMs intact  Lungs:   Coarse lung sounds bilaterally, respirations unlabored  Heart:  Irregularly irregular, tachycardic  Abdomen:   Remains distended though bowel sounds have improved and are now normoactive x 4 quadrants, no guarding or peritoneal signs  Extremities: Extremities normal, atraumatic, no  edema  Pulses: 2+ and symmetric    Lab Results: Recent Labs    10/24/19 0212 10/25/19 0107  NA 140 139  K 3.2* 3.2*  CL 106 104  CO2 23 27  GLUCOSE 103* 119*  BUN 11 5*  CREATININE 0.76 0.65  CALCIUM 7.7* 7.5*  MG 1.6*  --    Recent Labs    10/24/19 0212 10/25/19 0107  AST 28 24  ALT 21 21  ALKPHOS 58 52  BILITOT 1.4* 0.9  PROT 5.8* 5.6*  ALBUMIN 2.7* 2.5*   Recent Labs    10/24/19 0212 10/25/19 0107  WBC 9.0 9.4  HGB 8.3* 7.5*  HCT 27.1* 24.5*  MCV 89.4 89.1  PLT 282 308   Recent Labs    10/24/19 0212 10/25/19 0107  LABPROT 37.3* 52.2*  INR 3.9* 6.0*   Assessment: Abnormal CT: SBO vs. Ileus, clinically improving -Improved bowel sounds, tolerating clear liquids -No BM since admission -Mg 1.6 yesterday and K+  of 3.2 today  Anemia, melena x1: Hgb 7.5, decreased from Hgb 8.3 yesterday.  One black stool last night.   Pneumonia: fever, cough, possibly from aspiration: now on dysphagia 2 diet  A fib, on Coumadin. INR 3.9 as of 8/4  Plan: Supportive care with IVF.  Maintain Magnesium >2 and Potassium >4.  Suppository/enemas as recommended by surgical team.  Repeat abdominal x-rays today.  Protonix 40 mg IV BID due to melena and anemia.  [facility] will follow.  Salley Slaughter PA-C 10/25/2019, 12:40 PM  Contact #  [phone]

## 2019-10-25 NOTE — Progress Notes (Addendum)
[facility] Progress Note     Subjective: CC:  Denies abdominal pain, nausea, emesis. Per chart review BMx3 yesterday.  Objective: Vital signs in last 24 hours: Temp:  [98.1 F (36.7 C)-99.2 F (37.3 C)] 98.3 F (36.8 C) (08/05 0400) Pulse Rate:  [82-115] 90 (08/05 0400) Resp:  [19-21] 21 (08/05 0400) BP: (128-147)/(57-63) 137/60 (08/05 0400) SpO2:  [94 %-100 %] 97 % (08/05 0400)    Intake/Output from previous day: 08/04 0701 - 08/05 0700 In: 1410.7 [P.O.:960; IV Piggyback:450.7] Out: 700 [Urine:700] Intake/Output this shift: No intake/output data recorded.  PE: Gen:  Somnolent but arouses to voice, NAD, appears chronically ill with diffuse muscle wasting Card:  Irregularly irregular, pedal pulses 2+ BL Pulm:  Normal effort, coarse upper airway sounds Abd: Soft, mild distention - improved compared to previous exam, non-tender, non-distended,+BS Skin: warm and dry, no rashes  Psych: A&Ox3   Lab Results:  Recent Labs    10/24/19 0212 10/25/19 0107  WBC 9.0 9.4  HGB 8.3* 7.5*  HCT 27.1* 24.5*  PLT 282 308   BMET Recent Labs    10/24/19 0212 10/25/19 0107  NA 140 139  K 3.2* 3.2*  CL 106 104  CO2 23 27  GLUCOSE 103* 119*  BUN 11 5*  CREATININE 0.76 0.65  CALCIUM 7.7* 7.5*   PT/INR Recent Labs    10/24/19 0212 10/25/19 0107  LABPROT 37.3* 52.2*  INR 3.9* 6.0*   CMP     Component Value Date/Time   NA 139 10/25/2019 0107   NA 144 10/07/2015 0000   K 3.2 (L) 10/25/2019 0107   CL 104 10/25/2019 0107   CO2 27 10/25/2019 0107   GLUCOSE 119 (H) 10/25/2019 0107   BUN 5 (L) 10/25/2019 0107   BUN 16 10/07/2015 0000   CREATININE 0.65 10/25/2019 0107   CALCIUM 7.5 (L) 10/25/2019 0107   PROT 5.6 (L) 10/25/2019 0107   ALBUMIN 2.5 (L) 10/25/2019 0107   AST 24 10/25/2019 0107   ALT 21 10/25/2019 0107   ALKPHOS 52 10/25/2019 0107   BILITOT 0.9 10/25/2019 0107   GFRNONAA >60 10/25/2019 0107   GFRAA >60 10/25/2019 0107   Lipase      Component Value Date/Time   LIPASE 26 07/21/2018 0957       Studies/Results: DG Abd 2 Views  Result Date: 10/23/2019 CLINICAL DATA:  Small-bowel obstruction. EXAM: ABDOMEN - 2 VIEW COMPARISON:  A 03/2019 FINDINGS: Dilated large and small bowel loops appear unchanged. Lumbar spondylosis.  Left hip fracture repair. Bibasilar airspace disease.  Small left effusion. IMPRESSION: Dilated large and small bowel loops unchanged. Findings most compatible with ileus versus obstruction. Electronically Signed   By: Franchot Gallo [name]   On: 10/23/2019 10:29    Anti-infectives: Anti-infectives (From admission, onward)   Start     Dose/Rate Route Frequency Ordered Stop   10/24/19 2200  doxycycline (VIBRA-TABS) tablet 100 mg     Discontinue     100 mg Oral Every 12 hours 10/24/19 1335     10/22/19 2000  Ampicillin-Sulbactam (UNASYN) 3 g in sodium chloride 0.9 % 100 mL IVPB     Discontinue     3 g 200 mL/hr over 30 Minutes Intravenous Every 8 hours 10/22/19 1849     10/22/19 0200  cefTRIAXone (ROCEPHIN) 2 g in sodium chloride 0.9 % 100 mL IVPB  Status:  Discontinued        2 g 200 mL/hr over 30 Minutes Intravenous Every 24 hours 10/21/19 2044  10/22/19 0010   10/22/19 0030  Ampicillin-Sulbactam (UNASYN) 3 g in sodium chloride 0.9 % 100 mL IVPB  Status:  Discontinued        3 g 200 mL/hr over 30 Minutes Intravenous Every 8 hours 10/22/19 0023 10/22/19 1849   10/21/19 2200  doxycycline (VIBRAMYCIN) 100 mg in sodium chloride 0.9 % 250 mL IVPB  Status:  Discontinued        100 mg 125 mL/hr over 120 Minutes Intravenous Every 12 hours 10/21/19 2044 10/24/19 1335   10/21/19 1730  piperacillin-tazobactam (ZOSYN) IVPB 3.375 g        3.375 g 100 mL/hr over 30 Minutes Intravenous  Once 10/21/19 1716 10/21/19 1930   10/21/19 1730  vancomycin (VANCOREADY) IVPB 1250 mg/250 mL        1,250 mg 166.7 mL/hr over 90 Minutes Intravenous  Once 10/21/19 1716 10/21/19 2035     Assessment/Plan A. flutter on coumadin  (INR 3.3) and amio drip Seizure disorder Prior CVA HTN DM2  CAP - Per TRH -   Supraumbilical hernia - no evidence of incarceration on CT. Does not appear to be the transition point on CT. The hernia is reducible on exam. No indication for emergent or urgent surgery. Follow up outpatient.   SBO vs Ileus - No prior hx of abdominal surgeries - clinically improving, abdomen less distended, having bowel function, tolerating a small amt clears; clinical picture remains more consistent with ileus than obstruction - advance diet as tolerated, I will place him on FLD this morning - continue bowel regimen (colace, daily miralax) and PRN enemas/suppositories - Keep K > 4 and Mg > 2 to encourage bowel function - Mobilize as able for bowel function  - No acute surgical needs, general surgery will sign off, please call as needed.   FEN - FLD VTE - SCDs, chemical VTE per primary team. INR currently serotherapeutic getting Vit K today. ID - Abx for PNA per TRH   LOS: 3 days    Obie Dredge, [facility] Please see Amion for pager number during day hours 7:00am-4:30pm

## 2019-10-25 NOTE — Progress Notes (Signed)
Modified Barium Swallow Progress Note  Patient Details  Name: [name] MRN: [id number] Date of Birth: January 23, 1941  Today's Date: 10/25/2019  Modified Barium Swallow completed.  Full report located under Chart Review in the Imaging Section.  Brief recommendations include the following:  Clinical Impression  Pt has baseline cervical bony protrusion that appears fused however pharyneal space is adequate and epiglottis is able to invert without obstruction. The contraction of pharynx and tongue base is intermittently reduced which in combination with osteophytes, results in consistent vallecular and pyriform sinuses residue. One episode of barium backflow toward (not reaching) his upper nasopharynx. Laryngeal penetration present with honey, nectar and thin barium and silent aspiration with thin and nectar (< 10% trials). Increased residue and potential for aspiration with residue from honey consistency. Pt was instructed on chin tuck and earlier in study, chair placed in posterior position with pt reclined to assist with propulsion however there was no significant difference. Pt admitted with pneumonia and unfortunately remains at increased risk. If pt's medical status improves and he gains strength it is hopeful his swallow ability may return to baseline. Findings discussed with MD and decision to continue po's with Dys 2/nectar thick liquids, crush pills, upright position, small sips/bites, multiple swallow, hard coughs and full supervision.        Swallow Evaluation Recommendations       SLP Diet Recommendations: Dysphagia 2 (Fine chop) solids;Nectar thick liquid   Liquid Administration via: Cup   Medication Administration: Crushed with puree   Supervision: Patient able to self feed;Full supervision/cueing for compensatory strategies   Compensations: Minimize environmental distractions;Clear throat intermittently;Monitor for anterior loss;Small sips/bites;Slow rate;Other (Comment)  (intermittent cough)   Postural Changes: Seated upright at 90 degrees;Remain semi-upright after after feeds/meals (Comment)   Oral Care Recommendations: Oral care BID   Other Recommendations: Order thickener from pharmacy    Houston Siren 10/25/2019,12:09 PM  Orbie Pyo Colvin Caroli.Ed Risk analyst [phone] Office [phone]

## 2019-10-25 NOTE — TOC Progression Note (Signed)
Transition of Care [name]) - Progression Note    Patient Details  Name: [name] MRN: [id number] Date of Birth: 12-Jan-1941  Transition [facility] Care [facility]) CM/SW [place], RN Phone Number: 10/25/2019, 11:13 AM  Clinical Narrative:    CM has requested that PT evaluate if patient is safe to discharge to an independent living facility with [facility] services. Will await PT's recommendations to determine disposition.   Expected Discharge Plan: Skilled Nursing Facility Barriers to Discharge: Continued Medical Work up  Expected Discharge Plan and Services Expected Discharge Plan: [place] In-house Referral: NA Discharge Planning Services: CM Consult Post Acute Care Choice: NA Living arrangements for the past 2 months: Single Family Home                 DME Arranged: N/A DME Agency: NA       HH Arranged: NA HH Agency: NA         Social Determinants of Health (SDOH) Interventions    Readmission Risk Interventions Readmission Risk Prevention Plan 09/12/2019 07/25/2018  Transportation Screening Complete Complete  PCP or Specialist Appt within 5-7 Days - Complete  PCP or Specialist Appt within 3-5 Days Complete -  Home Care Screening - Complete  Medication Review (RN CM) - Complete  HRI or Home Care Consult Complete -  Social Work Consult for [place] Planning/Counseling Complete -  Palliative Care Screening Not Applicable -  Medication Review Press photographer) Complete -  Some recent data might be hidden

## 2019-10-25 NOTE — Progress Notes (Addendum)
Progress Note  Patient Name: [name] Date of Encounter: 10/25/2019  [facility] Cardiologist: Fransico Him, MD   Subjective   SBO continues to improve and diet is progressing.  Denies any chest pain, SOB or palpitations.  He has converted to NSR.  Inpatient Medications    Scheduled Meds: . amiodarone  200 mg Oral BID  . bisacodyl  10 mg Rectal Once  . docusate sodium  100 mg Oral BID  . doxycycline  100 mg Oral Q12H  . feeding supplement (ENSURE ENLIVE)  237 mL Oral TID BM  . ferrous sulfate  325 mg Oral BID WC  . folic acid  1 mg Oral Daily  . insulin aspart  0-9 Units Subcutaneous Q4H  . levETIRAcetam  1,500 mg Oral BID  . multivitamin with minerals  1 tablet Oral Daily  . pantoprazole  40 mg Oral Daily  . phenytoin  100 mg Oral BID  . polyethylene glycol  17 g Oral Daily  . potassium chloride  40 mEq Oral BID  . sodium chloride flush  3 mL Intravenous Q12H  . sodium phosphate  1 enema Rectal Once  . vitamin B-12  100 mcg Oral Daily  . Warfarin - Pharmacist Dosing Inpatient   Does not apply q1600   Continuous Infusions: . ampicillin-sulbactam (UNASYN) IV 3 g (10/25/19 0343)   PRN Meds: acetaminophen **OR** acetaminophen, albuterol, bisacodyl, morphine injection, ondansetron **OR** ondansetron (ZOFRAN) IV   Vital Signs    Vitals:   10/24/19 2131 10/25/19 0000 10/25/19 0400 10/25/19 0800  BP:  (!) 147/63 137/60 132/60  Pulse:  82 90 91  Resp:  19 (!) 21 (!) 21  Temp:  98.7 F (37.1 C) 98.3 F (36.8 C) 98.6 F (37 C)  TempSrc:  Oral Oral Oral  SpO2: 94% 100% 97% 99%  Weight:      Height:        Intake/Output Summary (Last 24 hours) at 10/25/2019 0900 Last data filed at 10/25/2019 9767 Gross per 24 hour  Intake 1120 ml  Output 700 ml  Net 420 ml   Last 3 Weights 10/21/2019 10/19/2019 09/09/2019  Weight (lbs) 121 lb 4.1 oz 121 lb 7.6 oz 121 lb 8 oz  Weight (kg) 55 kg 55.1 kg 55.112 kg      Telemetry    NSR - Personally Reviewed  ECG      No new ECG tracing today. - Personally Reviewed  Physical Exam   GEN: thin cachectic appearing HEENT: Normal NECK: No JVD; No carotid bruits LYMPHATICS: No lymphadenopathy CARDIAC:RRR, no murmurs, rubs, gallops RESPIRATORY:  Clear to auscultation without rales, wheezing or rhonchi  ABDOMEN: Soft, non-tender, non-distended MUSCULOSKELETAL:  No edema; No deformity  SKIN: Warm and dry NEUROLOGIC:  Alert and oriented x 3 PSYCHIATRIC:  Normal affect    Labs    High Sensitivity Troponin:   Recent Labs  Lab 10/19/19 1900 10/21/19 1742 10/21/19 1950  TROPONINIHS 4 18* 22*      Chemistry Recent Labs  Lab 10/23/19 0140 10/24/19 0212 10/25/19 0107  NA 139 140 139  K 2.8* 3.2* 3.2*  CL 102 106 104  CO2 24 23 27   GLUCOSE 114* 103* 119*  BUN 11 11 5*  CREATININE 0.79 0.76 0.65  CALCIUM 7.7* 7.7* 7.5*  PROT 5.4* 5.8* 5.6*  ALBUMIN 2.6* 2.7* 2.5*  AST 34 28 24  ALT 22 21 21   ALKPHOS 59 58 52  BILITOT 1.0 1.4* 0.9  GFRNONAA >60 >60 >60  GFRAA >60 >60 >60  ANIONGAP 13 11 8      Hematology Recent Labs  Lab 10/23/19 0140 10/24/19 0212 10/25/19 0107  WBC 11.1* 9.0 9.4  RBC 2.76* 3.03* 2.75*  HGB 7.8* 8.3* 7.5*  HCT 24.7* 27.1* 24.5*  MCV 89.5 89.4 89.1  MCH 28.3 27.4 27.3  MCHC 31.6 30.6 30.6  RDW 20.3* 20.3* 20.1*  PLT 231 282 308    BNP Recent Labs  Lab 10/21/19 1742  BNP 391.1*     DDimer No results for input(s): DDIMER in the last 168 hours.   Radiology    DG Abd 2 Views  Result Date: 10/23/2019 CLINICAL DATA:  Small-bowel obstruction. EXAM: ABDOMEN - 2 VIEW COMPARISON:  A 03/2019 FINDINGS: Dilated large and small bowel loops appear unchanged. Lumbar spondylosis.  Left hip fracture repair. Bibasilar airspace disease.  Small left effusion. IMPRESSION: Dilated large and small bowel loops unchanged. Findings most compatible with ileus versus obstruction. Electronically Signed   By: Franchot Gallo [name]   On: 10/23/2019 10:29    Cardiac Studies    Echocardiogram 10/22/2019: Impressions: 1. Left ventricular ejection fraction, by estimation, is 65 to 70%. The  left ventricle has normal function. Left ventricular endocardial border  not optimally defined to evaluate regional wall motion. There is mild left  ventricular hypertrophy. Left  ventricular diastolic parameters are indeterminate.  2. Right ventricular systolic function is normal. The right ventricular  size is normal. Tricuspid regurgitation signal is inadequate for assessing  PA pressure.  3. Right atrial size was mildly dilated.  4. The mitral valve is grossly normal. Trivial mitral valve  regurgitation.  5. The aortic valve was not well visualized. Aortic valve regurgitation  is not visualized. No aortic stenosis is present.  6. The inferior vena cava is dilated in size with >50% respiratory  variability, suggesting right atrial pressure of 8 mmHg.   Patient Profile     Brent Exley Foustis a 79 y.o.malewith a history ofparoxysmalatrialfibrillation/flutter on Amiodarone and Coumadin, CVA, hypertension, pancreatitis, seizures, and dementia who is being seen for evaluation of atrial fibrillation with RVR at the request of Dr. Avon Gully.  Assessment & Plan    Paroxysmal Atrial Fibrillation/Flutter with RVR - Patient presented in atrial fibrillation with RVR in setting of pneumonia and bowel obstruction. - Telemetry reviewed and looks like atrial flutter with rates in the 100's to 110's. - Potassium 3.2. Goal >4.0. Primary team supplementing. - Magnesium 1.6 yesterday Goal >2.0. Primary team supplementing. - TSH normal. - Echo showed LVEF of 60-65% with normal wall motion. - Suspect RVR is physiologic response due to underlying illness and suspect rates to improves as pneumonia and bowel obstruction improves. - changed from IV Amio to PO yesterday and now in NSR - On Coumadin at home. INR continues to climb. 6 today. Pharmacy following >> likely related to  NPO status with decreased Vit K intake and IV Amio.  No evidence of bleeding.  If climbs any further may need Vit K - will decrease Amio to 200mg  daily which is his home dose  Demand Ischemia - High-sensitivity troponin minimally elevated and flat 18 >>22. Not consistent with ACS.  - EKG showed diffuse ST depression when in atrial fibrillation with rates in the 140's. - No angina.  - Suspect demand ischemia in setting of sepsis with acute respiratory failure. No plans for ischemic evaluation at this time.  Sepsis Secondary to Community Acquired Pneumonia - Chest x-ray showedretrocardiac opacity. - Febrile on admission with temp as  high as 101.  - WBC peaked at 25 but has normalized - Lactic 3.2 >> 3.1 >> 2.1. - Blood/sputum cultures negative so far. - Continue antibiotics per primary team.  Bowel Obstruction - CT concerning for a partial distal small bowel obstruction. - GI/SUrgery has been consulted and plan is for conservative management at this time.  - diet advancing  Hypertension - BP improved today at 132/17mmHg - Not currently on any antihypertensives. - Continue to monitor.  Hypokalemia - Potassium 3.2 today. Being repleted.  - Continue to monitor closely.  Hypomagnesemia - Magnesium 1.6 yesterday. Being repleted. - Recheck today  Otherwise, per primary team.  [facility] will sign off.   Medication Recommendations:  Amiodarone 200mg  daily, Coumadin per pharmacy Other recommendations (labs, testing, etc):  none Follow up as an outpatient:  Coumadin clinic followup after discharge.  He has an appt with Afib clinic the first of Sept  For questions or updates, please contact [place] Please consult [email or web address] for contact info under        Signed, Fransico Him, MD  10/25/2019, 9:00 AM

## 2019-10-26 LAB — CBC
HCT: 24.4 % — ABNORMAL LOW (ref 39.0–52.0)
Hemoglobin: 7.6 g/dL — ABNORMAL LOW (ref 13.0–17.0)
MCH: 27.3 pg (ref 26.0–34.0)
MCHC: 31.1 g/dL (ref 30.0–36.0)
MCV: 87.8 fL (ref 80.0–100.0)
Platelets: 319 10*3/uL (ref 150–400)
RBC: 2.78 MIL/uL — ABNORMAL LOW (ref 4.22–5.81)
RDW: 20 % — ABNORMAL HIGH (ref 11.5–15.5)
WBC: 9.8 10*3/uL (ref 4.0–10.5)
nRBC: 0.5 % — ABNORMAL HIGH (ref 0.0–0.2)

## 2019-10-26 LAB — CULTURE, BLOOD (ROUTINE X 2)
Culture: NO GROWTH
Culture: NO GROWTH
Special Requests: ADEQUATE

## 2019-10-26 LAB — COMPREHENSIVE METABOLIC PANEL
ALT: 19 U/L (ref 0–44)
AST: 23 U/L (ref 15–41)
Albumin: 2.6 g/dL — ABNORMAL LOW (ref 3.5–5.0)
Alkaline Phosphatase: 56 U/L (ref 38–126)
Anion gap: 9 (ref 5–15)
BUN: 6 mg/dL — ABNORMAL LOW (ref 8–23)
CO2: 27 mmol/L (ref 22–32)
Calcium: 7.9 mg/dL — ABNORMAL LOW (ref 8.9–10.3)
Chloride: 104 mmol/L (ref 98–111)
Creatinine, Ser: 0.59 mg/dL — ABNORMAL LOW (ref 0.61–1.24)
GFR calc Af Amer: >60 mL/min (ref >60)
GFR calc non Af Amer: >60 mL/min (ref >60)
Glucose, Bld: 82 mg/dL (ref 70–99)
Potassium: 3.3 mmol/L — ABNORMAL LOW (ref 3.5–5.1)
Sodium: 140 mmol/L (ref 135–145)
Total Bilirubin: 0.8 mg/dL (ref 0.3–1.2)
Total Protein: 5.6 g/dL — ABNORMAL LOW (ref 6.5–8.1)

## 2019-10-26 LAB — GLUCOSE, CAPILLARY
Glucose-Capillary: 102 mg/dL — ABNORMAL HIGH (ref 70–99)
Glucose-Capillary: 102 mg/dL — ABNORMAL HIGH (ref 70–99)
Glucose-Capillary: 106 mg/dL — ABNORMAL HIGH (ref 70–99)
Glucose-Capillary: 146 mg/dL — ABNORMAL HIGH (ref 70–99)
Glucose-Capillary: 71 mg/dL (ref 70–99)
Glucose-Capillary: 91 mg/dL (ref 70–99)

## 2019-10-26 LAB — PROTIME-INR
INR: 3.5 — ABNORMAL HIGH (ref 0.8–1.2)
Prothrombin Time: 33.9 seconds — ABNORMAL HIGH (ref 11.4–15.2)

## 2019-10-26 NOTE — Progress Notes (Signed)
PROGRESS NOTE    [name] [facility]  NUU:[id number] DOB: 1941/03/13 DOA: 10/21/2019 PCP: [name], MD      Brief Narrative:  [name] is a 79 y.o. M with atrial fibrillation on warfarin, history of seizures, dementia,, cirrhosis compensated, CVA, and HTN who presented with generalized malaise, found to have community-acquired pneumonia.  Subsequent hospital stay complicated by ileus.      Assessment & Plan:  Severe sepsis with acute hypoxic respiratory failure due to community-acquired pneumonia Presented with leukocytosis, tachycardia, and fever due to infection in the lung, complicated by acute hypoxic respiratory failure.  Aspiration likely.   Patient now mentating at baseline, taking orals.  Temp < 100 F, heart rate < 100bpm, RR < 24, SpO2 at baseline.      -Continue doxycycline, Unasyn, day 6 -Continue pulmonary toilet   Ileus Patient noted to have abdominal distention and diarrhea with nausea.  Imaging showed ileus versus partial obstruction.  Clinically it appeared to be an ileus, now resolved    Alcoholic liver cirrhosis, chronic, stable Compensated  Mild dementia  History of seizures epilepsy No seizure activity -Continue Dilantin, Keppra  Normocytic anemia Hemoglobin continued to trend down, no clinical bleeding   Iron studies normal in June.  B12 and folate replete. -Transfusion threshold 7 g/dL  Supratherapeutic INR INR improved -Hold warfarin, resume per protocol  Paroxysmal atrial fibrillation Rate controlled. -Continue amiodarone -Keep magnesium greater than 2, potassium greater than 4 -Supplement potassium and magnesium -Consult cardiology, appreciate recommendations -Continue warfarin, per protocol, monitor INR  Hypomagnesemia Hypokalemia -Continue supplementing potassium   Dysphagia Dysphagia 2 diet High risk for aspiration, multifactorial, most likely due to deconditioning from acute illness as well as baseline dementia  and moderate protein calorie malnutrition  Moderate protein calorie malnutrition        Disposition: Status is: Inpatient  Remains inpatient appropriate because:IV treatments appropriate due to intensity of illness or inability to take PO   Dispo: The patient is from: Home              Anticipated d/c is to: SNF              Anticipated d/c date is: Likely tomorrow              Patient currently is not medically stable to d/c.              MDM: The below labs and imaging reports were reviewed and summarized above.  Medication management as above.     DVT prophylaxis: N/a on warfarin  Code Status: FULL Family Communication: Daughter by phone   Consultants:   Cardiology  Gen Surg            Subjective: He still is tolerating oral diet, but has a cough, he is still confused no fever, no vomiting.  He has some right arm pain where his IV is.  He is still extremely weak.  Objective: Vitals:   10/25/19 2332 10/26/19 0323 10/26/19 0825 10/26/19 1449  BP: 132/70 (!) 158/68  128/65  Pulse: (!) 103 (!) 103  94  Resp: 20 20  20   Temp: 98.7 F (37.1 C) 98.7 F (37.1 C) 98.3 F (36.8 C) 98.5 F (36.9 C)  TempSrc:  Oral    SpO2: 95% 97%  97%  Weight:      Height:        Intake/Output Summary (Last 24 hours) at 10/26/2019 1842 Last data filed at 10/25/2019 2300 Gross per 24 hour  Intake 540 ml  Output 550 ml  Net -10 ml   Filed Weights   10/21/19 1603  Weight: 55 kg    Examination: General appearance: Thin elderly male, lying in bed, no acute distress, makes eye contact, interactive, but somewhat distracted     HEENT:    Skin: Ecchymoses and bruises occasionally, no evidence of phlebitis from the right forearm, no induration or swelling of the right forearm. Cardiac: Irregularly irregular, normal rate, no JVD, no lower extremity edema Respiratory: Respiratory effort normal, lungs diminished throughout, coarse upper airway noises, no  wheezing. Abdomen: Abdomen soft without tenderness palpation or guarding, no ascites or distention MSK: Severe diffuse loss of subcutaneous muscle mass and fat. Neuro: Extraocular movements intact, moves all extremities with generalized weakness, severe leg weakness, but symmetrically, speech fluent Psych: Attention diminished, affect blunted, judgment insight appear impaired.      Data Reviewed: I have personally reviewed following labs and imaging studies:  CBC: Recent Labs  Lab 10/19/19 1900 10/21/19 1607 10/22/19 0415 10/23/19 0140 10/24/19 0212 10/25/19 0107 10/26/19 0315  WBC 14.3*   < > 25.1* 11.1* 9.0 9.4 9.8  NEUTROABS 12.3*  --  23.3*  --   --   --   --   HGB 9.6*   < > 8.7* 7.8* 8.3* 7.5* 7.6*  HCT 31.7*   < > 27.8* 24.7* 27.1* 24.5* 24.4*  MCV 93.0   < > 89.7 89.5 89.4 89.1 87.8  PLT 250   < > 226 231 282 308 319   < > = values in this interval not displayed.   Basic Metabolic Panel: Recent Labs  Lab 10/21/19 1607 10/21/19 2339 10/22/19 0415 10/23/19 0140 10/24/19 0212 10/25/19 0107 10/26/19 0315  NA   < >  --  136 139 140 139 140  K   < >  --  3.2* 2.8* 3.2* 3.2* 3.3*  CL   < >  --  101 102 106 104 104  CO2   < >  --  25 24 23 27 27   GLUCOSE   < >  --  140* 114* 103* 119* 82  BUN   < >  --  14 11 11  5* 6*  CREATININE   < >  --  0.70 0.79 0.76 0.65 0.59*  CALCIUM   < >  --  8.3* 7.7* 7.7* 7.5* 7.9*  MG  --  1.5* 1.8  --  1.6*  --   --   PHOS  --  2.2* 2.1*  --   --   --   --    < > = values in this interval not displayed.   GFR: Estimated Creatinine Clearance: 57.3 mL/min (A) (by C-G formula based on SCr of 0.59 mg/dL (L)). Liver Function Tests: Recent Labs  Lab 10/22/19 0415 10/23/19 0140 10/24/19 0212 10/25/19 0107 10/26/19 0315  AST 39 34 28 24 23   ALT 22 22 21 21 19   ALKPHOS 69 59 58 52 56  BILITOT 0.9 1.0 1.4* 0.9 0.8  PROT 6.2* 5.4* 5.8* 5.6* 5.6*  ALBUMIN 3.0* 2.6* 2.7* 2.5* 2.6*   No results for input(s): LIPASE, AMYLASE in the  last 168 hours. No results for input(s): AMMONIA in the last 168 hours. Coagulation Profile: Recent Labs  Lab 10/22/19 0415 10/23/19 0140 10/24/19 0212 10/25/19 0107 10/26/19 0315  INR 2.8* 3.3* 3.9* 6.0* 3.5*   Cardiac Enzymes: No results for input(s): CKTOTAL, CKMB, CKMBINDEX, TROPONINI in the last 168 hours. BNP (last 3  results) No results for input(s): PROBNP in the last 8760 hours. HbA1C: No results for input(s): HGBA1C in the last 72 hours. CBG: Recent Labs  Lab 10/25/19 2326 10/26/19 0319 10/26/19 0824 10/26/19 1121 10/26/19 1623  GLUCAP 171* 71 91 106* 102*   Lipid Profile: No results for input(s): CHOL, HDL, LDLCALC, TRIG, CHOLHDL, LDLDIRECT in the last 72 hours. Thyroid Function Tests: No results for input(s): TSH, T4TOTAL, FREET4, T3FREE, THYROIDAB in the last 72 hours. Anemia Panel: No results for input(s): VITAMINB12, FOLATE, FERRITIN, TIBC, IRON, RETICCTPCT in the last 72 hours. Urine analysis:    Component Value Date/Time   COLORURINE YELLOW 10/21/2019 2050   APPEARANCEUR HAZY (A) 10/21/2019 2050   LABSPEC 1.028 10/21/2019 2050   PHURINE 5.0 10/21/2019 2050   GLUCOSEU >=500 (A) 10/21/2019 2050   HGBUR SMALL (A) 10/21/2019 2050   [place] NEGATIVE 10/21/2019 2050   [place] 10/21/2019 2050   PROTEINUR 100 (A) 10/21/2019 2050   UROBILINOGEN 0.2 06/02/2011 0300   NITRITE NEGATIVE 10/21/2019 2050   LEUKOCYTESUR NEGATIVE 10/21/2019 2050   Sepsis Labs: @LABRCNTIP (procalcitonin:4,lacticacidven:4)  ) Recent Results (from the past 240 hour(s))  SARS Coronavirus 2 by RT PCR (hospital order, performed in [place] hospital lab) Nasopharyngeal Nasopharyngeal Swab     Status: None   Collection Time: 10/19/19  8:32 PM   Specimen: Nasopharyngeal Swab  Result Value Ref Range Status   SARS Coronavirus 2 NEGATIVE NEGATIVE Final    Comment: (NOTE) SARS-CoV-2 target nucleic acids are NOT DETECTED.  The SARS-CoV-2 RNA is generally detectable in  upper and lower respiratory specimens during the acute phase of infection. The lowest concentration of SARS-CoV-2 viral copies this assay can detect is 250 copies / mL. A negative result does not preclude SARS-CoV-2 infection and should not be used as the sole basis for treatment or other patient management decisions.  A negative result may occur with improper specimen collection / handling, submission of specimen other than nasopharyngeal swab, presence of viral mutation(s) within the areas targeted by this assay, and inadequate number of viral copies (<250 copies / mL). A negative result must be combined with clinical observations, patient history, and epidemiological information.  Fact Sheet for Patients:   StrictlyIdeas.no  Fact Sheet for Healthcare Providers: BankingDealers.co.za  This test is not yet approved or  cleared by the [place] FDA and has been authorized for detection and/or diagnosis of SARS-CoV-2 by FDA under an Emergency Use Authorization (EUA).  This EUA will remain in effect (meaning this test can be used) for the duration of the COVID-19 declaration under Section 564(b)(1) of the Act, 21 U.S.C. section 360bbb-3(b)(1), unless the authorization is terminated or revoked sooner.  Performed at [facility], [place] [address] [place], [place] [address]   Urine culture     Status: None   Collection Time: 10/19/19  9:50 PM   Specimen: In/Out Cath Urine  Result Value Ref Range Status   Specimen Description IN/OUT CATH URINE  Final   Special Requests NONE  Final   Culture   Final    NO GROWTH Performed at [facility], [place] [address] [place], [place] [address]    Report Status 10/21/2019 FINAL  Final  Blood culture (routine x 2)     Status: None   Collection Time: 10/21/19  5:43 PM   Specimen: BLOOD  Result Value Ref Range Status   Specimen Description BLOOD SITE NOT SPECIFIED  Final   Special  Requests   Final  BOTTLES DRAWN AEROBIC AND ANAEROBIC Blood Culture adequate volume   Culture   Final    NO GROWTH 5 DAYS Performed at [facility], [place] [address] [place], [place] [address]    Report Status 10/26/2019 FINAL  Final  Blood culture (routine x 2)     Status: None   Collection Time: 10/21/19  6:11 PM   Specimen: BLOOD LEFT FOREARM  Result Value Ref Range Status   Specimen Description BLOOD LEFT FOREARM  Final   Special Requests   Final    BOTTLES DRAWN AEROBIC AND ANAEROBIC Blood Culture results may not be optimal due to an inadequate volume of blood received in culture bottles   Culture   Final    NO GROWTH 5 DAYS Performed at [facility], [place] [address] [place], [place] [address]    Report Status 10/26/2019 FINAL  Final  Urine culture     Status: Abnormal   Collection Time: 10/21/19  8:50 PM   Specimen: Urine, Clean Catch  Result Value Ref Range Status   Specimen Description URINE, CLEAN CATCH  Final   Special Requests NONE  Final   Culture (A)  Final    <10,000 COLONIES/mL INSIGNIFICANT GROWTH Performed at [facility], [place] [address], [place], [place] [address]    Report Status 10/22/2019 FINAL  Final  Respiratory Panel by PCR     Status: None   Collection Time: 10/21/19  9:27 PM   Specimen: Nasopharyngeal Swab; Respiratory  Result Value Ref Range Status   Adenovirus NOT DETECTED NOT DETECTED Final   Coronavirus 229E NOT DETECTED NOT DETECTED Final    Comment: (NOTE) The Coronavirus on the Respiratory Panel, DOES NOT test for the novel  Coronavirus (2019 nCoV)    Coronavirus HKU1 NOT DETECTED NOT DETECTED Final   Coronavirus NL63 NOT DETECTED NOT DETECTED Final   Coronavirus OC43 NOT DETECTED NOT DETECTED Final   Metapneumovirus NOT DETECTED NOT DETECTED Final   Rhinovirus / Enterovirus NOT DETECTED NOT DETECTED Final   Influenza A NOT DETECTED NOT DETECTED Final   Influenza B NOT DETECTED NOT DETECTED Final   Parainfluenza Virus  1 NOT DETECTED NOT DETECTED Final   Parainfluenza Virus 2 NOT DETECTED NOT DETECTED Final   Parainfluenza Virus 3 NOT DETECTED NOT DETECTED Final   Parainfluenza Virus 4 NOT DETECTED NOT DETECTED Final   Respiratory Syncytial Virus NOT DETECTED NOT DETECTED Final   Bordetella pertussis NOT DETECTED NOT DETECTED Final   Chlamydophila pneumoniae NOT DETECTED NOT DETECTED Final   Mycoplasma pneumoniae NOT DETECTED NOT DETECTED Final    Comment: Performed at [name], [place]. [address] [place], [place] [address]  Culture, sputum-assessment     Status: None   Collection Time: 10/21/19 11:44 PM   Specimen: Sputum  Result Value Ref Range Status   Specimen Description SPUTUM  Final   Special Requests COLLECTED IN THE ER  Final   Sputum evaluation   Final    THIS SPECIMEN IS ACCEPTABLE FOR SPUTUM CULTURE Performed at [facility], [address], [place], [place] [address]    Report Status 10/22/2019 FINAL  Final  Culture, respiratory     Status: None   Collection Time: 10/21/19 11:44 PM   Specimen: SPU  Result Value Ref Range Status   Specimen Description SPUTUM  Final   Special Requests COLLECTED IN THE ER Reflexed from [id number]  Final   Gram Stain   Final    RARE  WBC PRESENT,BOTH PMN AND MONONUCLEAR FEW GRAM POSITIVE COCCI IN PAIRS FEW GRAM VARIABLE ROD RARE BUDDING YEAST SEEN    Culture   Final    RARE Consistent with normal respiratory flora. Performed at [facility], [place] [address], [place],  [address]    Report Status 10/24/2019 FINAL  Final  MRSA PCR Screening     Status: None   Collection Time: 10/22/19 12:43 AM   Specimen: Nasal Mucosa; Nasopharyngeal  Result Value Ref Range Status   MRSA by PCR NEGATIVE NEGATIVE Final    Comment:        The GeneXpert MRSA Assay (FDA approved for NASAL specimens only), is one component of a comprehensive MRSA colonization surveillance program. It is not intended to diagnose MRSA infection nor to guide  or monitor treatment for MRSA infections. Performed at [facility], [place] [address], [place],  [address]          Radiology Studies: DG Abd 2 Views  Result Date: 10/25/2019 CLINICAL DATA:  SBO follow-up EXAM: ABDOMEN - 2 VIEW COMPARISON:  10/23/2019 FINDINGS: There is residual contrast within stomach and distal small bowel from recent swallow study. Air-filled, dilated loops large and small bowel again identified. Appearance is not substantially changed. No free air. IMPRESSION: Mildly dilated, air-filled loops colon and distal small bowel likely reflecting ileus or obstruction. Appearance is similar to the prior study. Electronically Signed   By: Macy Mis [name]   On: 10/25/2019 14:17   DG Swallowing Func-Speech Pathology  Result Date: 10/25/2019 Objective Swallowing Evaluation: Type of Study: MBS-Modified Barium Swallow Study  Patient Details Name: Yonah Tangeman MRN: [id number] Date of Birth: Apr 08, 1940 Today's Date: 10/25/2019 Time: SLP Start Time (ACUTE ONLY): 0626 -SLP Stop Time (ACUTE ONLY): 1059 SLP Time Calculation (min) (ACUTE ONLY): 24 min Past Medical History: Past Medical History: Diagnosis Date . Diabetes mellitus without complication ([place])  . Hiatal hernia  . HTN (hypertension)   pt denies h/o HTN though it appears he was started on spironolactone during his hospitalization 3/13 . Pancreatitis  . Seizures ([place])  . Stroke ([place])  . Typical atrial flutter ([place]) 3/13 Past Surgical History: Past Surgical History: Procedure Laterality Date . ESOPHAGOGASTRODUODENOSCOPY N/A 11/30/2015  Procedure: ESOPHAGOGASTRODUODENOSCOPY (EGD);  Surgeon: Wonda Horner, MD;  Location: [facility] ENDOSCOPY;  Service: Endoscopy;  Laterality: N/A; . ESOPHAGOGASTRODUODENOSCOPY N/A 09/11/2019  Procedure: ESOPHAGOGASTRODUODENOSCOPY (EGD);  Surgeon: Wonda Horner, MD;  Location: [name] ENDOSCOPY;  Service: Endoscopy;  Laterality: N/A; . ESOPHAGOGASTRODUODENOSCOPY (EGD) WITH PROPOFOL N/A 07/23/2018  Procedure:  ESOPHAGOGASTRODUODENOSCOPY (EGD) WITH PROPOFOL;  Surgeon: Otis Brace, MD;  Location: [facility] ENDOSCOPY;  Service: Gastroenterology;  Laterality: N/A; . FEMUR IM NAIL Left 12/08/2016 . FEMUR IM NAIL Left 12/07/2016  Procedure: INTRAMEDULLARY (IM) NAIL FEMORAL;  Surgeon: Renette Butters, MD;  Location: [place];  Service: Orthopedics;  Laterality: Left; . GIVENS CAPSULE STUDY N/A 07/24/2018  Procedure: GIVENS CAPSULE STUDY;  Surgeon: Otis Brace, MD;  Location: [place];  Service: Gastroenterology;  Laterality: N/A; . HIP SURGERY Left 01/2017 . HOT HEMOSTASIS N/A 07/23/2018  Procedure: HOT HEMOSTASIS (ARGON PLASMA COAGULATION/BICAP);  Surgeon: Otis Brace, MD;  Location[facility] ENDOSCOPY;  Service: Gastroenterology;  Laterality: N/A; . HOT HEMOSTASIS N/A 09/11/2019  Procedure: HOT HEMOSTASIS (ARGON PLASMA COAGULATION/BICAP);  Surgeon: Wonda Horner, MD;  Location[facility] ENDOSCOPY;  Service: Endoscopy;  Laterality: N/A; . no surgical history  06/2011 HPI: Pt is an 79 yo male admitted secondary to generalized weakness and fever. Pt also with A  fib bowel obstruction vs. ileus. PMH includes HTN, hiatal hernia, eizures, dementia, a fib, and CVA. CT abdomen also revealed airspace consolidation in both lung bases may represent pneumonia or aspiration. BSE 07/2019 reg/thin recommended.  No data recorded Assessment / Plan / Recommendation CHL IP CLINICAL IMPRESSIONS 10/25/2019 Clinical Impression Pt has baseline cervical bony protrusion that appears fused however pharyneal space is adequate and epiglottis is able to invert without obstruction. The contraction of pharynx and tongue base is intermittently reduced which in combination with osteophytes, results in consistent vallecular and pyriform sinuses residue. One episode of barium backflow toward (not reaching) his upper nasopharynx. Laryngeal penetration present with honey, nectar and thin barium and silent aspiration with thin and nectar (< 10% trials). Increased residue  and potential for aspiration with residue from honey consistency. Pt was instructed on chin tuck and earlier in study, chair placed in posterior position with pt reclined to assist with propulsion however there was no significant difference. Pt admitted with pneumonia and unfortunately remains at increased risk. If pt's medical status improves and he gains strength it is hopeful his swallow ability may return to baseline. Findings discussed with MD and decision to continue po's with Dys 2/nectar thick liquids, crush pills, upright position, small sips/bites, multiple swallow, hard coughs and full supervision.      SLP Visit Diagnosis Dysphagia, pharyngeal phase (R13.13) Attention and concentration deficit following -- Frontal lobe and executive function deficit following -- Impact on safety and function Severe aspiration risk;Moderate aspiration risk   CHL IP TREATMENT RECOMMENDATION 10/25/2019 Treatment Recommendations Therapy as outlined in treatment plan below   Prognosis 10/25/2019 Prognosis for Safe Diet Advancement Fair Barriers to Reach Goals Cognitive deficits;Other (Comment) Barriers/Prognosis Comment -- CHL IP DIET RECOMMENDATION 10/25/2019 SLP Diet Recommendations Dysphagia 2 (Fine chop) solids;Nectar thick liquid Liquid Administration via Cup Medication Administration Crushed with puree Compensations Minimize environmental distractions;Clear throat intermittently;Monitor for anterior loss;Small sips/bites;Slow rate;Other (Comment) Postural Changes Seated upright at 90 degrees;Remain semi-upright after after feeds/meals (Comment)   CHL IP OTHER RECOMMENDATIONS 10/25/2019 Recommended Consults -- Oral Care Recommendations Oral care BID Other Recommendations Order thickener from pharmacy   CHL IP FOLLOW UP RECOMMENDATIONS 10/25/2019 Follow up Recommendations Skilled Nursing facility   [facility] IP FREQUENCY AND DURATION 10/25/2019 Speech Therapy Frequency (ACUTE ONLY) min 2x/week Treatment Duration 2 weeks      CHL IP ORAL  PHASE 10/25/2019 Oral Phase WFL Oral - Pudding Teaspoon -- Oral - Pudding Cup -- Oral - Honey Teaspoon -- Oral - Honey Cup -- Oral - Nectar Teaspoon -- Oral - Nectar Cup -- Oral - Nectar Straw -- Oral - Thin Teaspoon -- Oral - Thin Cup -- Oral - Thin Straw -- Oral - Puree -- Oral - Mech Soft -- Oral - Regular -- Oral - Multi-Consistency -- Oral - Pill -- Oral Phase - Comment --  CHL IP PHARYNGEAL PHASE 10/25/2019 Pharyngeal Phase Impaired Pharyngeal- Pudding Teaspoon -- Pharyngeal -- Pharyngeal- Pudding Cup -- Pharyngeal -- Pharyngeal- Honey Teaspoon -- Pharyngeal -- Pharyngeal- Honey Cup Penetration/Aspiration during swallow;Pharyngeal residue - valleculae;Reduced pharyngeal peristalsis;Pharyngeal residue - pyriform Pharyngeal Material enters airway, remains ABOVE vocal cords and not ejected out Pharyngeal- Nectar Teaspoon Pharyngeal residue - pyriform;Pharyngeal residue - valleculae Pharyngeal -- Pharyngeal- Nectar Cup Pharyngeal residue - valleculae;Pharyngeal residue - pyriform;Penetration/Aspiration during swallow;Reduced pharyngeal peristalsis Pharyngeal Material enters airway, passes BELOW cords without attempt by patient to eject out (silent aspiration);Material enters airway, remains ABOVE vocal cords and not ejected out Pharyngeal- Nectar Straw -- Pharyngeal -- Pharyngeal- Thin  Teaspoon -- Pharyngeal -- Pharyngeal- Thin Cup Penetration/Aspiration during swallow;Pharyngeal residue - valleculae;Pharyngeal residue - pyriform Pharyngeal Material enters airway, CONTACTS cords and not ejected out;Material enters airway, passes BELOW cords without attempt by patient to eject out (silent aspiration) Pharyngeal- Thin Straw -- Pharyngeal -- Pharyngeal- Puree Pharyngeal residue - valleculae;Pharyngeal residue - pyriform Pharyngeal -- Pharyngeal- Mechanical Soft -- Pharyngeal -- Pharyngeal- Regular -- Pharyngeal -- Pharyngeal- Multi-consistency -- Pharyngeal -- Pharyngeal- Pill -- Pharyngeal -- Pharyngeal Comment --  CHL  IP CERVICAL ESOPHAGEAL PHASE 10/25/2019 Cervical Esophageal Phase WFL Pudding Teaspoon -- Pudding Cup -- Honey Teaspoon -- Honey Cup -- Nectar Teaspoon -- Nectar Cup -- Nectar Straw -- Thin Teaspoon -- Thin Cup -- Thin Straw -- Puree -- Mechanical Soft -- Regular -- Multi-consistency -- Pill -- Cervical Esophageal Comment -- Houston Siren 10/25/2019, 12:08 PM Orbie Pyo Colvin Caroli.Ed Actor Pager [phone] Office [phone]                   Scheduled Meds: . amiodarone  200 mg Oral Daily  . bisacodyl  10 mg Rectal Once  . docusate sodium  100 mg Oral BID  . doxycycline  100 mg Oral Q12H  . feeding supplement (ENSURE ENLIVE)  237 mL Oral TID BM  . ferrous sulfate  325 mg Oral BID WC  . folic acid  1 mg Oral Daily  . insulin aspart  0-9 Units Subcutaneous Q4H  . levETIRAcetam  1,500 mg Oral BID  . multivitamin with minerals  1 tablet Oral Daily  . pantoprazole (PROTONIX) IV  40 mg Intravenous Q12H  . phenytoin  100 mg Oral BID  . polyethylene glycol  17 g Oral Daily  . sodium chloride flush  3 mL Intravenous Q12H  . sodium phosphate  1 enema Rectal Once  . vitamin B-12  100 mcg Oral Daily  . Warfarin - Pharmacist Dosing Inpatient   Does not apply q1600   Continuous Infusions: . ampicillin-sulbactam (UNASYN) IV 3 g (10/26/19 1328)     LOS: 4 days    Time spent: 25 minutes    Edwin Dada, MD [facility] 10/26/2019, 6:42 PM     Please page though [employer] or [facility] secure chat:  For [employer], Adult nurse

## 2019-10-26 NOTE — Progress Notes (Addendum)
ANTICOAGULATION CONSULT NOTE  Pharmacy Consult:  Warfarin Indication: atrial fibrillation  No Known Allergies  Patient Measurements: Height: 6' (182.9 cm) Weight: 55 kg (121 lb 4.1 oz) IBW/kg (Calculated) : 77.6  Vital Signs: Temp: 98.3 F (36.8 C) (08/06 0825) Temp Source: Oral (08/06 0323) BP: 158/68 (08/06 0323) Pulse Rate: 103 (08/06 0323)  Labs: Recent Labs    10/24/19 0212 10/24/19 0212 10/25/19 0107 10/26/19 0315  HGB 8.3*   < > 7.5* 7.6*  HCT 27.1*  --  24.5* 24.4*  PLT 282  --  308 319  LABPROT 37.3*  --  52.2* 33.9*  INR 3.9*  --  6.0* 3.5*  CREATININE 0.76  --  0.65 0.59*   < > = values in this interval not displayed.    Estimated Creatinine Clearance: 57.3 mL/min (A) (by C-G formula based on SCr of 0.59 mg/dL (L)).  Assessment: 79 YO M on warfarin PTA for Afib. Warfarin was initially held for partial SBO. Pharmacy consulted to restart warfarin 8/4 after passing swallow evaluation and tolerating  Clears.    INR today 3.5 - trending down after Vitamin K 2.5mg  po x1 on 8/5. Still elevated above goal.    Outpatient INRs in July were either sub or supratherapeutic, likely due to acute illnesses including GIB in June and labile PO intake.   Warfarin dose PTA: 2.5mg  on Mon/Fri, 5mg  all other days  Goal of Therapy:  INR 2-3 Monitor platelets by anticoagulation protocol: Yes   Plan:  Hold warfarin tonight given INR 3.5 Monitor daily INR, CBC/plt Monitor for signs/symptoms of bleeding    *Based on labile PO intake - patient may still have fluctuations in INR. If plan to discharge, would hold today and do daily INR checks. Once able to resume, likely will need to reduce regimen to ~2mg  daily with frequent INR checks.   Sloan Leiter, PharmD, BCPS, BCCCP Clinical Pharmacist Please check AMION for all [place] phone numbers After 10:00 PM, call [place] [phone] 10/26/2019, 9:29 AM

## 2019-10-26 NOTE — Progress Notes (Signed)
[facility] Gastroenterology Progress Note  Brent Novello Coomes 79 y.o. [birth year]  CC:  SBO vs. Ileus   Subjective: Patient remains confused. Denies abdominal pain or any complaints.  Per flow sheet, 2 brown bowel movements yesterday.  No bowel movements thus far today  ROS : Limited by dementia, though patient denies any complaints.  Objective: Vital signs in last 24 hours: Vitals:   10/26/19 0323 10/26/19 0825  BP: (!) 158/68   Pulse: (!) 103   Resp: 20   Temp: 98.7 F (37.1 C) 98.3 F (36.8 C)  SpO2: 97%     Physical Exam:  General:  Lethargic, thin, cooperative, confused, elderly, no acute distress  Head:  Normocephalic, without obvious abnormality, atraumatic  Eyes:  Mild conjunctival pallor, EOMs intact  Lungs:   Coarse lung sounds bilaterally, respirations unlabored  Heart:  Irregularly irregular, tachycardic  Abdomen:   Remains distended though bowel sounds have improved and are now active x 4 quadrants, no guarding or peritoneal signs  Extremities: Extremities normal, atraumatic, no  edema  Pulses: 2+ and symmetric    Lab Results: Recent Labs    10/24/19 0212 10/24/19 0212 10/25/19 0107 10/26/19 0315  NA 140   < > 139 140  K 3.2*   < > 3.2* 3.3*  CL 106   < > 104 104  CO2 23   < > 27 27  GLUCOSE 103*   < > 119* 82  BUN 11   < > 5* 6*  CREATININE 0.76   < > 0.65 0.59*  CALCIUM 7.7*   < > 7.5* 7.9*  MG 1.6*  --   --   --    < > = values in this interval not displayed.   Recent Labs    10/25/19 0107 10/26/19 0315  AST 24 23  ALT 21 19  ALKPHOS 52 56  BILITOT 0.9 0.8  PROT 5.6* 5.6*  ALBUMIN 2.5* 2.6*   Recent Labs    10/25/19 0107 10/26/19 0315  WBC 9.4 9.8  HGB 7.5* 7.6*  HCT 24.5* 24.4*  MCV 89.1 87.8  PLT 308 319   Recent Labs    10/25/19 0107 10/26/19 0315  LABPROT 52.2* 33.9*  INR 6.0* 3.5*   Assessment: Abnormal CT: SBO vs. Ileus, clinically improving, 2 BMs yesterday -Mg 1.6 8/4 and K+ of 3.3 today -Abdominal x-ray 8/5:  Mildly dilated, air-filled loops colon and distal small bowel likely reflecting ileus or obstruction. Appearance is similar to the prior study.  Anemia, melena x1, now having brown stools: Hgb 7.6, stable as compared to 7.5 yesterday -INR 3.5  Pneumonia: fever, cough, possibly from aspiration: now on dysphagia 2 diet  A fib, on Coumadin. INR 3.5 as of 8/5  Plan: Supportive care with IVF.  Maintain Magnesium >2 and Potassium >4.  Suppository/enemas as recommended by surgical team.  Continue Protonix 40 mg IV BID.  [facility] will sign off. Please contact us if we can be of any further assistance during this hospital stay.  Salley Slaughter PA-C 10/26/2019, 11:38 AM  Contact #  [phone]

## 2019-10-26 NOTE — NC FL2 (Signed)
[place] LEVEL OF CARE SCREENING TOOL     IDENTIFICATION  Patient Name: [name] Birthdate: November 09, 1940 Sex: male Admission Date (Current Location): 10/21/2019  [facility] and [place] Number:  Herbalist and Address:  [facility], [place] [address], [place], [place] [address]      Provider Number: [id number]  Attending Physician Name and Address:  Edwin Dada, *  Relative Name and Phone Number:  [name] (daughter) [phone]    Current Level of Care: Hospital Recommended Level of Care: [place] Prior Approval Number:    Date Approved/Denied:   PASRR Number: [id number]  Discharge Plan: SNF    Current Diagnoses: Patient Active Problem List   Diagnosis Date Noted  . Elevated troponin   . Hypophosphatemia 10/22/2019  . Atrial fibrillation with RVR ([place])   . Hypomagnesemia 09/12/2019  . Acute blood loss anemia 09/12/2019  . Persistent atrial fibrillation ([place]) 08/22/2019  . Secondary hypercoagulable state ([place]) 08/22/2019  . Sepsis ([place]) 07/21/2019  . Total bilirubin, elevated   . Dilantin toxicity, accidental or unintentional, initial encounter 10/20/2018  . Dilantin overdose, accidental or unintentional, initial encounter 10/20/2018  . GI bleed 07/22/2018  . Current chronic use of systemic steroids 01/11/2017  . RBBB 01/11/2017  . Polymyalgia ([place]) 01/11/2017  . Fever 01/07/2017  . GI bleeding 01/06/2017  . History of CVA in adulthood 01/06/2017  . Retroperitoneal hematoma 01/06/2017  . Pressure injury of skin 12/13/2016  . Fall   . Closed fracture of femur, intertrochanteric, left, initial encounter ([place]) 12/06/2016  . Severe protein-calorie malnutrition ([place]) 04/03/2016  . Hypotension 04/01/2016  . Leukopenia 03/31/2016  . Elevated INR 03/31/2016  . Hypokalemia 03/31/2016  . Acute pain of right knee 03/01/2016  . CAP (community acquired pneumonia) 12/01/2015  .  Aspiration pneumonia ([place]) 12/01/2015  . Erosive gastropathy: Per EGD 11/30/2015 11/30/2015  . Normochromic anemia 11/28/2015  . Localization-related symptomatic epilepsy and epileptic syndromes with complex partial seizures, not intractable, without status epilepticus ([place]) 07/07/2015  . Closed Colles' fracture of right radius with routine healing 05/02/2015  . Pain 04/18/2015  . Dementia (AD) 11/16/2012  . Chronic anticoagulation 06/30/2011  . Paroxysmal atrial flutter ([place]) 06/04/2011  . 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 SINUSITIS, CHRONIC NEC 12/14/2006  . ABUSE, ALCOHOL, CONTINUOUS 10/11/2006  . History of cardiovascular disorder 10/11/2006  . Essential hypertension 09/15/2006  . Stroke ([place]) 09/15/2006  . ALLERGIC RHINITIS 09/15/2006  . CIRRHOSIS, ALCOHOLIC, LIVER 73/42/8768  . Seizure disorder ([place]) 09/15/2006  . PANCREATITIS, HX OF 09/15/2006    Orientation RESPIRATION BLADDER Height & Weight     Self, Place  Normal Incontinent, External catheter Weight: 55 kg Height:  6' (182.9 cm)  BEHAVIORAL SYMPTOMS/MOOD NEUROLOGICAL BOWEL NUTRITION STATUS   (none noted)  (History of seizure disorder on oral medications) Continent Diet (Dysphagia 2 nectar thick liquids)  AMBULATORY STATUS COMMUNICATION OF NEEDS Skin   Extensive Assist Verbally Other (Comment) (ecchymosis to right and left arm)                       Personal Care Assistance Level of Assistance  Bathing, Feeding, Dressing, Total care Bathing Assistance: Maximum assistance Feeding assistance: Limited assistance Dressing Assistance: Maximum assistance Total Care Assistance: Maximum assistance   Functional Limitations Info  Sight, Hearing, Speech Sight Info: Adequate Hearing Info: Adequate Speech Info: Adequate    SPECIAL CARE FACTORS FREQUENCY  PT (By licensed PT), OT (By licensed OT)  PT Frequency: 5X OT Frequency: 5X            Contractures Contractures Info: Not present     Additional Factors Info  Code Status, Allergies, Psychotropic, Insulin Sliding Scale, Isolation Precautions, Suctioning Needs Code Status Info: Full Allergies Info: NKDA Psychotropic Info: N/a Insulin Sliding Scale Info: Sliding scale insulin every 4 hours Isolation Precautions Info: n/a Suctioning Needs: n/a   Current Medications (10/26/2019):  This is the current hospital active medication list Current Facility-Administered Medications  Medication Dose Route Frequency Provider Last Rate Last Admin  . acetaminophen (TYLENOL) tablet 650 mg  650 mg Oral Q6H PRN Toy Baker, MD   650 mg at 10/24/19 2346   Or  . acetaminophen (TYLENOL) suppository 650 mg  650 mg Rectal Q6H PRN Doutova, Anastassia, MD      . albuterol (PROVENTIL) (2.5 MG/3ML) 0.083% nebulizer solution 2.5 mg  2.5 mg Nebulization Q2H PRN Doutova, Anastassia, MD      . amiodarone (PACERONE) tablet 200 mg  200 mg Oral Daily Sueanne Margarita, MD   200 mg at 10/26/19 0931  . Ampicillin-Sulbactam (UNASYN) 3 g in sodium chloride 0.9 % 100 mL IVPB  3 g Intravenous Q8H Little Ishikawa, MD 200 mL/hr at 10/26/19 0457 3 g at 10/26/19 0457  . bisacodyl (DULCOLAX) suppository 10 mg  10 mg Rectal Daily PRN Jill Alexanders, PA-C      . bisacodyl (DULCOLAX) suppository 10 mg  10 mg Rectal Once Simaan, Elizabeth S, PA-C      . docusate sodium (COLACE) capsule 100 mg  100 mg Oral BID Toy Baker, MD   100 mg at 10/25/19 2220  . doxycycline (VIBRA-TABS) tablet 100 mg  100 mg Oral Q12H Danford, Suann Larry, MD   100 mg at 10/26/19 0931  . feeding supplement (ENSURE ENLIVE) (ENSURE ENLIVE) liquid 237 mL  237 mL Oral TID BM Danford, Christopher P, MD      . ferrous sulfate tablet 325 mg  325 mg Oral BID WC Edwin Dada, MD   325 mg at 10/26/19 0932  . folic acid (FOLVITE) tablet 1 mg  1 mg Oral Daily Danford, Suann Larry, MD   1 mg at 10/26/19 0931  . food thickener (THICK IT) powder   Oral PRN Danford,  Suann Larry, MD      . insulin aspart (novoLOG) injection 0-9 Units  0-9 Units Subcutaneous Q4H Toy Baker, MD   2 Units at 10/26/19 0016  . levETIRAcetam (KEPPRA) tablet 1,500 mg  1,500 mg Oral BID Edwin Dada, MD   1,500 mg at 10/26/19 0931  . morphine 2 MG/ML injection 0.5 mg  0.5 mg Intravenous Q4H PRN Little Ishikawa, MD   0.5 mg at 10/26/19 0857  . multivitamin with minerals tablet 1 tablet  1 tablet Oral Daily Danford, Suann Larry, MD   1 tablet at 10/26/19 0931  . ondansetron (ZOFRAN) tablet 4 mg  4 mg Oral Q6H PRN Doutova, Anastassia, MD       Or  . ondansetron (ZOFRAN) injection 4 mg  4 mg Intravenous Q6H PRN Toy Baker, MD   4 mg at 10/24/19 0601  . pantoprazole (PROTONIX) injection 40 mg  40 mg Intravenous Q12H Salley Slaughter, PA-C   40 mg at 10/26/19 0930  . phenytoin (DILANTIN) chewable tablet 100 mg  100 mg Oral BID Edwin Dada, MD   100 mg at 10/26/19 1062  . polyethylene glycol (MIRALAX / GLYCOLAX) packet 17 g  17  g Oral Daily Jillyn Ledger, PA-C   17 g at 10/26/19 0930  . Resource ThickenUp Clear   Oral PRN Danford, Suann Larry, MD      . sodium chloride flush (NS) 0.9 % injection 3 mL  3 mL Intravenous Q12H Doutova, Anastassia, MD   3 mL at 10/26/19 0939  . sodium phosphate (FLEET) 7-19 GM/118ML enema 1 enema  1 enema Rectal Once Simaan, Elizabeth S, PA-C      . vitamin B-12 (CYANOCOBALAMIN) tablet 100 mcg  100 mcg Oral Daily Edwin Dada, MD   100 mcg at 10/26/19 0939  . Warfarin - Pharmacist Dosing Inpatient   Does not apply q1600 Donnamae Jude, [facility]         Discharge Medications: Please see discharge summary for a list of discharge medications.  Relevant Imaging Results:  Relevant Lab Results:   Additional Information SSN: [id number]  Angelita Ingles, RN

## 2019-10-26 NOTE — Progress Notes (Signed)
  Speech Language Pathology Treatment: Dysphagia  Patient Details Name: [name] MRN: [id number] DOB: 1940-10-24 Today's Date: 10/26/2019 Time: 1062-6948 SLP Time Calculation (min) (ACUTE ONLY): 16 min  Assessment / Plan / Recommendation Clinical Impression  Skilled intervention included educating pt to MBS including showing him the video loops of his study and a regular MBS on iphone.  Also attempted to reinforce diet modification and precautions by providing him with written precautions.  Demonstrated to pt a "hock" in attempts to help him clear vallecular retention. He performed x2 but was not productive nor strong.  Encouraged pt to continue this exercises to help with airway protection, pharyngeal clerance.  Education is ongoing.  Pt perseverating on his pain in his right shoulder despite being informed RN is aware and reaching out to MD.  He absolutely denies dysphagia and given nature is primarily mechanical - suspect chronic dysphagia for which he has compensated until recently.  HPI HPI: Pt is an 79 yo male admitted secondary to generalized weakness and fever. Pt also with A fib bowel obstruction vs. ileus. PMH includes HTN, hiatal hernia, eizures, dementia, a fib, and CVA. CT abdomen also revealed airspace consolidation in both lung bases may represent pneumonia or aspiration. BSE 07/2019 reg/thin recommended.       SLP Plan  Continue with current plan of care       Recommendations  Diet recommendations: Dysphagia 2 (fine chop);Nectar-thick liquid Medication Administration: Crushed with puree (or thick liquids) Supervision: Patient able to self feed Compensations: Minimize environmental distractions;Clear throat intermittently;Monitor for anterior loss;Small sips/bites;Slow rate;Other (Comment) Postural Changes and/or Swallow Maneuvers: Seated upright 90 degrees;Upright 30-60 min after meal                Oral Care Recommendations: Oral care BID Follow up  Recommendations: Skilled Nursing facility SLP Visit Diagnosis: Dysphagia, pharyngeal phase (R13.13) Plan: Continue with current plan of care       [name], [name] 10/26/2019, 9:42 AM   Kathleen Lime, MS [place] Office [phone]

## 2019-10-26 NOTE — Consult Note (Signed)
   [facility] CM Inpatient Consult   10/26/2019  Ormond Lazo [facility] Aug 17, 1940 [id number]  Follow up:  Medicare NextGen ACO Active status  Brief chart review for disposition needs reveals patient is being recommended for a skilled nursing facility level of care, currently for out of the area.  Plan: follow for disposition  For questions, please contact:  Natividad Brood, RN BSN [facility] Liaison  [phone] business mobile phone Toll free office [phone]  Fax number: [phone] [email or web address]

## 2019-10-26 NOTE — TOC Progression Note (Addendum)
Transition of Care [facility]) - Progression Note    Patient Details  Name: Cardarius Senat MRN: [id number] Date of Birth: 1941/02/08  [facility]) CM/SW Contact  Angelita Ingles, RN Phone Number: [phone]  10/26/2019, 12:43 PM  Clinical Narrative:  CM called daughter and Dr. Loleta Books explained the need for SNF for rehab as the safest discharge plan for now. Daughter was agreeable stating that she didn't understand this need. Daughter is now agreeable to have patient placed in a SNF close to her in the [place] area.   Daughter called back requesting to know if patient would be a good candidate for Inpatient rehab here at [name]. Daughter has been informed that the patient would have to participate in extensive rehab and he unfortunately does not have the strength right now. But would be a good candidate to seek rehab at the SNF and then to follow up with other options for independent living. Daughter is agreeable and verbalizes understanding. Daughter is requesting that CM check with [place] and [facility] as options for SNF near her.   FL2 has been completed and PASRR number retrieved. CM has called [facility] and [facility] to initiate a referral. Message has been left for Admissions director at both facilities. Will await return call. Daughter does not want patient faxed out in the [facility]      Expected Discharge Plan: Skilled Nursing Facility Barriers to Discharge: Continued Medical Work up  Expected Discharge Plan and Services Expected Discharge Plan: [place] In-house Referral: NA Discharge Planning Services: CM Consult Post Acute Care Choice: NA Living arrangements for the past 2 months: Single Family Home                 DME Arranged: N/A DME Agency: NA       HH Arranged: NA HH Agency: NA         Social Determinants of Health (SDOH) Interventions    Readmission Risk  Interventions Readmission Risk Prevention Plan 09/12/2019 07/25/2018  Transportation Screening Complete Complete  PCP or Specialist Appt within 5-7 Days - Complete  PCP or Specialist Appt within 3-5 Days Complete -  Home Care Screening - Complete  Medication Review (RN CM) - Complete  HRI or Home Care Consult Complete -  Social Work Consult for [place] Planning/Counseling Complete -  Palliative Care Screening Not Applicable -  Medication Review Press photographer) Complete -  Some recent data might be hidden

## 2019-10-27 LAB — GLUCOSE, CAPILLARY
Glucose-Capillary: 124 mg/dL — ABNORMAL HIGH (ref 70–99)
Glucose-Capillary: 88 mg/dL (ref 70–99)
Glucose-Capillary: 168 mg/dL — ABNORMAL HIGH (ref 70–99)
Glucose-Capillary: 156 mg/dL — ABNORMAL HIGH (ref 70–99)
Glucose-Capillary: 200 mg/dL — ABNORMAL HIGH (ref 70–99)
Glucose-Capillary: 142 mg/dL — ABNORMAL HIGH (ref 70–99)
Glucose-Capillary: 102 mg/dL — ABNORMAL HIGH (ref 70–99)

## 2019-10-27 LAB — PROTIME-INR
INR: 3.2 — ABNORMAL HIGH (ref 0.8–1.2)
Prothrombin Time: 32 seconds — ABNORMAL HIGH (ref 11.4–15.2)

## 2019-10-27 LAB — CBC
HCT: 23.5 % — ABNORMAL LOW (ref 39.0–52.0)
Hemoglobin: 7.6 g/dL — ABNORMAL LOW (ref 13.0–17.0)
MCH: 28.3 pg (ref 26.0–34.0)
MCHC: 32.3 g/dL (ref 30.0–36.0)
MCV: 87.4 fL (ref 80.0–100.0)
Platelets: 350 10*3/uL (ref 150–400)
RBC: 2.69 MIL/uL — ABNORMAL LOW (ref 4.22–5.81)
RDW: 20.4 % — ABNORMAL HIGH (ref 11.5–15.5)
WBC: 8.1 10*3/uL (ref 4.0–10.5)
nRBC: 0.9 % — ABNORMAL HIGH (ref 0.0–0.2)

## 2019-10-27 MED ORDER — PHENYTOIN SODIUM EXTENDED 100 MG PO CAPS
200.0000 mg | ORAL_CAPSULE | Freq: Two times a day (BID) | ORAL | Status: DC
  Filled 2019-10-27: qty 2

## 2019-10-27 MED ORDER — POTASSIUM CHLORIDE CRYS ER 20 MEQ PO TBCR
40.0000 meq | EXTENDED_RELEASE_TABLET | Freq: Once | ORAL | Status: AC
  Administered 2019-10-27: 40 meq via ORAL
  Filled 2019-10-27: qty 2

## 2019-10-27 NOTE — TOC Progression Note (Signed)
Transition of Care [facility]) - Progression Note    Patient Details  Name: Anthem Frazer MRN: [id number] Date of Birth: 1940/07/03  Transition [facility] Care [facility]) CM/SW [place], [place] Phone Number: 10/27/2019, 5:13 PM  Clinical Narrative:    CSW attempted to follow-up with [facility] and [place] [facility]. CSW informed [facility] admissions is not available during the weekend. Unable to make contact with [place] admissions, [facility] team will continue to assist with discharge planning needs.   Expected Discharge Plan: Skilled Nursing Facility Barriers to Discharge: Continued Medical Work up  Expected Discharge Plan and Services Expected Discharge Plan: [place] In-house Referral: NA Discharge Planning Services: CM Consult Post Acute Care Choice: NA Living arrangements for the past 2 months: Single Family Home                 DME Arranged: N/A DME Agency: NA       HH Arranged: NA HH Agency: NA         Social Determinants of Health (SDOH) Interventions    Readmission Risk Interventions Readmission Risk Prevention Plan 09/12/2019 07/25/2018  Transportation Screening Complete Complete  PCP or Specialist Appt within 5-7 Days - Complete  PCP or Specialist Appt within 3-5 Days Complete -  Home Care Screening - Complete  Medication Review (RN CM) - Complete  HRI or Home Care Consult Complete -  Social Work Consult for [place] Planning/Counseling Complete -  Palliative Care Screening Not Applicable -  Medication Review Press photographer) Complete -  Some recent data might be hidden

## 2019-10-27 NOTE — Progress Notes (Signed)
PROGRESS NOTE    [name] [facility]  LKG:[id number] DOB: 1940/10/14 DOA: 10/21/2019 PCP: [name], MD      Brief Narrative:  [name] is a 79 y.o. M with atrial fibrillation on warfarin, history of seizures, dementia,, cirrhosis compensated, CVA, and HTN who presented with generalized malaise, found to have community-acquired pneumonia.  Subsequent hospital stay complicated by ileus.      Assessment & Plan:  Severe sepsis with acute hypoxic respiratory failure due to community-acquired pneumonia Presented with leukocytosis, tachycardia, and fever due to infection in the lung, complicated by acute hypoxic respiratory failure.  Aspiration likely.   Patient now mentating at baseline, taking orals.  Temp < 100 F, heart rate < 100bpm, RR < 24, SpO2 at baseline.      -Continue doxycycline, Unasyn, day 7 of 7 -Continue pulmonary toilet   Ileus Patient noted to have abdominal distention and diarrhea with nausea.  Imaging showed ileus versus partial obstruction.  Clinically it appeared to be an ileus, now resolved    Alcoholic liver cirrhosis, chronic, stable Compensated  Mild dementia  History of seizures epilepsy No seizure activity -Continue Dilantin, Keppra   Normocytic anemia Hemoglobin stable around 7  Iron studies normal in June.  B12 and folate replete. -Transfusion threshold 7 g/dL  Supratherapeutic INR INR stable on Augmentin -Resume warfarin per protocol pending INR  Paroxysmal atrial fibrillation Rate within goal. -Continue amiodarone -Keep magnesium greater than 2, potassium greater than 4 -Supplement potassium and magnesium -Consult cardiology, appreciate recommendations -Resume warfarin when INR returns to protocol parametersR  Hypomagnesemia Hypokalemia -Supplement potassium   Dysphagia -Continue dysphagia 2 diet High risk for aspiration, multifactorial, most likely due to deconditioning from acute illness as well as baseline dementia  and moderate protein calorie malnutrition  Severe protein calorie malnutrition        Disposition: Status is: Inpatient  Remains inpatient appropriate because:IV treatments appropriate due to intensity of illness or inability to take PO   Dispo: The patient is from: Home              Anticipated d/c is to: SNF              Anticipated d/c date is: When SNF bed available              Patient currently is not medically stable to d/c.  Due to no safe disposition              MDM: The below labs and imaging reports were reviewed and summarized above.  Medication management as above.     DVT prophylaxis: N/a on warfarin  Code Status: FULL Family Communication:    Consultants:   Cardiology  Gen Surg            Subjective: No change in cough, no vomiting, no fever.  Right arm pain is better, still very weak.  Objective: Vitals:   10/26/19 0825 10/26/19 1449 10/26/19 2217 10/27/19 1259  BP:  128/65 112/64 124/70  Pulse:  94 95 91  Resp:  20 20 16   Temp: 98.3 F (36.8 C) 98.5 F (36.9 C) 98.4 F (36.9 C) 98.9 F (37.2 C)  TempSrc:    Oral  SpO2:  97% 94% 96%  Weight:      Height:        Intake/Output Summary (Last 24 hours) at 10/27/2019 1635 Last data filed at 10/27/2019 1421 Gross per 24 hour  Intake 780 ml  Output 475 ml  Net 305 ml  Filed Weights   10/21/19 1603  Weight: 55 kg    Examination: General appearance: Thin elderly male, lying in bed, no acute distress, sleeping but arouses and makes eye contact     HEENT: Anicteric, conjunctival pink, lids and lashes normal.  No nasal deformity, discharge, or epistaxis.  Oropharynx tacky dry, no oral lesions, dentition in good repair, lips dry and cracked, hearing diminished Skin: Scattered ecchymoses, no evidence of phlebitis in the right arm Cardiac: Irregularly irregular, normal rate, no JVD, no lower extremity edema Respiratory: Normal respiratory effort, lungs diminished  throughout, coarse upper airway sounds, no rales or wheezing Abdomen: Abdomen soft without tenderness palpation or guarding, no ascites or distention MSK: Diffuse severe loss of subcutaneous muscle mass and fat Neuro: Extraocular movements intact, severe generalized weakness, speech fluent Psych: Attention diminished, affect constrained, judgment insight impaired.        Data Reviewed: I have personally reviewed following labs and imaging studies:  CBC: Recent Labs  Lab 10/22/19 0415 10/22/19 0415 10/23/19 0140 10/24/19 0212 10/25/19 0107 10/26/19 0315 10/27/19 0132  WBC 25.1*   < > 11.1* 9.0 9.4 9.8 8.1  NEUTROABS 23.3*  --   --   --   --   --   --   HGB 8.7*   < > 7.8* 8.3* 7.5* 7.6* 7.6*  HCT 27.8*   < > 24.7* 27.1* 24.5* 24.4* 23.5*  MCV 89.7   < > 89.5 89.4 89.1 87.8 87.4  PLT 226   < > 231 282 308 319 350   < > = values in this interval not displayed.   Basic Metabolic Panel: Recent Labs  Lab 10/21/19 1607 10/21/19 2339 10/22/19 0415 10/23/19 0140 10/24/19 0212 10/25/19 0107 10/26/19 0315  NA   < >  --  136 139 140 139 140  K   < >  --  3.2* 2.8* 3.2* 3.2* 3.3*  CL   < >  --  101 102 106 104 104  CO2   < >  --  25 24 23 27 27   GLUCOSE   < >  --  140* 114* 103* 119* 82  BUN   < >  --  14 11 11  5* 6*  CREATININE   < >  --  0.70 0.79 0.76 0.65 0.59*  CALCIUM   < >  --  8.3* 7.7* 7.7* 7.5* 7.9*  MG  --  1.5* 1.8  --  1.6*  --   --   PHOS  --  2.2* 2.1*  --   --   --   --    < > = values in this interval not displayed.   GFR: Estimated Creatinine Clearance: 57.3 mL/min (A) (by C-G formula based on SCr of 0.59 mg/dL (L)). Liver Function Tests: Recent Labs  Lab 10/22/19 0415 10/23/19 0140 10/24/19 0212 10/25/19 0107 10/26/19 0315  AST 39 34 28 24 23   ALT 22 22 21 21 19   ALKPHOS 69 59 58 52 56  BILITOT 0.9 1.0 1.4* 0.9 0.8  PROT 6.2* 5.4* 5.8* 5.6* 5.6*  ALBUMIN 3.0* 2.6* 2.7* 2.5* 2.6*   No results for input(s): LIPASE, AMYLASE in the last 168  hours. No results for input(s): AMMONIA in the last 168 hours. Coagulation Profile: Recent Labs  Lab 10/23/19 0140 10/24/19 0212 10/25/19 0107 10/26/19 0315 10/27/19 0132  INR 3.3* 3.9* 6.0* 3.5* 3.2*   Cardiac Enzymes: No results for input(s): CKTOTAL, CKMB, CKMBINDEX, TROPONINI in the last 168 hours. BNP (  last 3 results) No results for input(s): PROBNP in the last 8760 hours. HbA1C: No results for input(s): HGBA1C in the last 72 hours. CBG: Recent Labs  Lab 10/26/19 2035 10/26/19 2346 10/27/19 0344 10/27/19 0749 10/27/19 1204  GLUCAP 146* 102* 124* 88 168*   Lipid Profile: No results for input(s): CHOL, HDL, LDLCALC, TRIG, CHOLHDL, LDLDIRECT in the last 72 hours. Thyroid Function Tests: No results for input(s): TSH, T4TOTAL, FREET4, T3FREE, THYROIDAB in the last 72 hours. Anemia Panel: No results for input(s): VITAMINB12, FOLATE, FERRITIN, TIBC, IRON, RETICCTPCT in the last 72 hours. Urine analysis:    Component Value Date/Time   COLORURINE YELLOW 10/21/2019 2050   APPEARANCEUR HAZY (A) 10/21/2019 2050   LABSPEC 1.028 10/21/2019 2050   PHURINE 5.0 10/21/2019 2050   GLUCOSEU >=500 (A) 10/21/2019 2050   HGBUR SMALL (A) 10/21/2019 2050   [place] NEGATIVE 10/21/2019 2050   [place] 10/21/2019 2050   PROTEINUR 100 (A) 10/21/2019 2050   UROBILINOGEN 0.2 06/02/2011 0300   NITRITE NEGATIVE 10/21/2019 2050   LEUKOCYTESUR NEGATIVE 10/21/2019 2050   Sepsis Labs: @LABRCNTIP (procalcitonin:4,lacticacidven:4)  ) Recent Results (from the past 240 hour(s))  SARS Coronavirus 2 by RT PCR (hospital order, performed in [place] hospital lab) Nasopharyngeal Nasopharyngeal Swab     Status: None   Collection Time: 10/19/19  8:32 PM   Specimen: Nasopharyngeal Swab  Result Value Ref Range Status   SARS Coronavirus 2 NEGATIVE NEGATIVE Final    Comment: (NOTE) SARS-CoV-2 target nucleic acids are NOT DETECTED.  The SARS-CoV-2 RNA is generally detectable in upper  and lower respiratory specimens during the acute phase of infection. The lowest concentration of SARS-CoV-2 viral copies this assay can detect is 250 copies / mL. A negative result does not preclude SARS-CoV-2 infection and should not be used as the sole basis for treatment or other patient management decisions.  A negative result may occur with improper specimen collection / handling, submission of specimen other than nasopharyngeal swab, presence of viral mutation(s) within the areas targeted by this assay, and inadequate number of viral copies (<250 copies / mL). A negative result must be combined with clinical observations, patient history, and epidemiological information.  Fact Sheet for Patients:   StrictlyIdeas.no  Fact Sheet for Healthcare Providers: BankingDealers.co.za  This test is not yet approved or  cleared by the [place] FDA and has been authorized for detection and/or diagnosis of SARS-CoV-2 by FDA under an Emergency Use Authorization (EUA).  This EUA will remain in effect (meaning this test can be used) for the duration of the COVID-19 declaration under Section 564(b)(1) of the Act, 21 U.S.C. section 360bbb-3(b)(1), unless the authorization is terminated or revoked sooner.  Performed at [facility], [place] [address], [place], [place] [address]   Urine culture     Status: None   Collection Time: 10/19/19  9:50 PM   Specimen: In/Out Cath Urine  Result Value Ref Range Status   Specimen Description IN/OUT CATH URINE  Final   Special Requests NONE  Final   Culture   Final    NO GROWTH Performed at [facility], [place] [address] [place], [place] [address]    Report Status 10/21/2019 FINAL  Final  Blood culture (routine x 2)     Status: None   Collection Time: 10/21/19  5:43 PM   Specimen: BLOOD  Result Value Ref Range Status   Specimen Description BLOOD SITE NOT SPECIFIED  Final   Special Requests  Final    BOTTLES DRAWN AEROBIC AND ANAEROBIC Blood Culture adequate volume   Culture   Final    NO GROWTH 5 DAYS Performed at [facility], [place] [address], [place], [place] [address]    Report Status 10/26/2019 FINAL  Final  Blood culture (routine x 2)     Status: None   Collection Time: 10/21/19  6:11 PM   Specimen: BLOOD LEFT FOREARM  Result Value Ref Range Status   Specimen Description BLOOD LEFT FOREARM  Final   Special Requests   Final    BOTTLES DRAWN AEROBIC AND ANAEROBIC Blood Culture results may not be optimal due to an inadequate volume of blood received in culture bottles   Culture   Final    NO GROWTH 5 DAYS Performed at [facility], [place] [address], [place], [place] [address]    Report Status 10/26/2019 FINAL  Final  Urine culture     Status: Abnormal   Collection Time: 10/21/19  8:50 PM   Specimen: Urine, Clean Catch  Result Value Ref Range Status   Specimen Description URINE, CLEAN CATCH  Final   Special Requests NONE  Final   Culture (A)  Final    <10,000 COLONIES/mL INSIGNIFICANT GROWTH Performed at [facility], [place] [address], [place], [place] [address]    Report Status 10/22/2019 FINAL  Final  Respiratory Panel by PCR     Status: None   Collection Time: 10/21/19  9:27 PM   Specimen: Nasopharyngeal Swab; Respiratory  Result Value Ref Range Status   Adenovirus NOT DETECTED NOT DETECTED Final   Coronavirus 229E NOT DETECTED NOT DETECTED Final    Comment: (NOTE) The Coronavirus on the Respiratory Panel, DOES NOT test for the novel  Coronavirus (2019 nCoV)    Coronavirus HKU1 NOT DETECTED NOT DETECTED Final   Coronavirus NL63 NOT DETECTED NOT DETECTED Final   Coronavirus OC43 NOT DETECTED NOT DETECTED Final   Metapneumovirus NOT DETECTED NOT DETECTED Final   Rhinovirus / Enterovirus NOT DETECTED NOT DETECTED Final   Influenza A NOT DETECTED NOT DETECTED Final   Influenza B NOT DETECTED NOT DETECTED Final   Parainfluenza Virus 1 NOT  DETECTED NOT DETECTED Final   Parainfluenza Virus 2 NOT DETECTED NOT DETECTED Final   Parainfluenza Virus 3 NOT DETECTED NOT DETECTED Final   Parainfluenza Virus 4 NOT DETECTED NOT DETECTED Final   Respiratory Syncytial Virus NOT DETECTED NOT DETECTED Final   Bordetella pertussis NOT DETECTED NOT DETECTED Final   Chlamydophila pneumoniae NOT DETECTED NOT DETECTED Final   Mycoplasma pneumoniae NOT DETECTED NOT DETECTED Final    Comment: Performed at [facility], [place]. [address], [place], [place] [address]  Culture, sputum-assessment     Status: None   Collection Time: 10/21/19 11:44 PM   Specimen: Sputum  Result Value Ref Range Status   Specimen Description SPUTUM  Final   Special Requests COLLECTED IN THE ER  Final   Sputum evaluation   Final    THIS SPECIMEN IS ACCEPTABLE FOR SPUTUM CULTURE Performed at [facility], [address], [place],  [address]    Report Status 10/22/2019 FINAL  Final  Culture, respiratory     Status: None   Collection Time: 10/21/19 11:44 PM   Specimen: SPU  Result Value Ref Range Status   Specimen Description SPUTUM  Final   Special Requests COLLECTED IN THE ER Reflexed from [id number]  Final   Gram Stain   Final  RARE WBC PRESENT,BOTH PMN AND MONONUCLEAR FEW GRAM POSITIVE COCCI IN PAIRS FEW GRAM VARIABLE ROD RARE BUDDING YEAST SEEN    Culture   Final    RARE Consistent with normal respiratory flora. Performed at [facility], [place] [address] [place], [place] [address]    Report Status 10/24/2019 FINAL  Final  MRSA PCR Screening     Status: None   Collection Time: 10/22/19 12:43 AM   Specimen: Nasal Mucosa; Nasopharyngeal  Result Value Ref Range Status   MRSA by PCR NEGATIVE NEGATIVE Final    Comment:        The GeneXpert MRSA Assay (FDA approved for NASAL specimens only), is one component of a comprehensive MRSA colonization surveillance program. It is not intended to diagnose MRSA infection nor to guide or monitor  treatment for MRSA infections. Performed at [facility], [place] [address] [place], [place] [address]          Radiology Studies: No results found.      Scheduled Meds: . amiodarone  200 mg Oral Daily  . bisacodyl  10 mg Rectal Once  . docusate sodium  100 mg Oral BID  . doxycycline  100 mg Oral Q12H  . feeding supplement (ENSURE ENLIVE)  237 mL Oral TID BM  . ferrous sulfate  325 mg Oral BID WC  . folic acid  1 mg Oral Daily  . insulin aspart  0-9 Units Subcutaneous Q4H  . levETIRAcetam  1,500 mg Oral BID  . multivitamin with minerals  1 tablet Oral Daily  . pantoprazole (PROTONIX) IV  40 mg Intravenous Q12H  . phenytoin  100 mg Oral BID  . phenytoin  200 mg Oral BID  . polyethylene glycol  17 g Oral Daily  . sodium chloride flush  3 mL Intravenous Q12H  . sodium phosphate  1 enema Rectal Once  . vitamin B-12  100 mcg Oral Daily  . Warfarin - Pharmacist Dosing Inpatient   Does not apply q1600   Continuous Infusions: . ampicillin-sulbactam (UNASYN) IV 3 g (10/27/19 1114)     LOS: 5 days    Time spent: 25 minutes    Edwin Dada, MD [facility] 10/27/2019, 4:35 PM     Please page though [place] or Epic secure chat:  For [employer], Adult nurse

## 2019-10-28 LAB — GLUCOSE, CAPILLARY
Glucose-Capillary: 106 mg/dL — ABNORMAL HIGH (ref 70–99)
Glucose-Capillary: 107 mg/dL — ABNORMAL HIGH (ref 70–99)
Glucose-Capillary: 108 mg/dL — ABNORMAL HIGH (ref 70–99)
Glucose-Capillary: 128 mg/dL — ABNORMAL HIGH (ref 70–99)
Glucose-Capillary: 146 mg/dL — ABNORMAL HIGH (ref 70–99)
Glucose-Capillary: 164 mg/dL — ABNORMAL HIGH (ref 70–99)

## 2019-10-28 LAB — PROTIME-INR
INR: 3.8 — ABNORMAL HIGH (ref 0.8–1.2)
Prothrombin Time: 36.6 seconds — ABNORMAL HIGH (ref 11.4–15.2)

## 2019-10-28 LAB — CBC
HCT: 23.9 % — ABNORMAL LOW (ref 39.0–52.0)
Hemoglobin: 7.5 g/dL — ABNORMAL LOW (ref 13.0–17.0)
MCH: 28 pg (ref 26.0–34.0)
MCHC: 31.4 g/dL (ref 30.0–36.0)
MCV: 89.2 fL (ref 80.0–100.0)
Platelets: 325 10*3/uL (ref 150–400)
RBC: 2.68 MIL/uL — ABNORMAL LOW (ref 4.22–5.81)
RDW: 21.1 % — ABNORMAL HIGH (ref 11.5–15.5)
WBC: 8.6 10*3/uL (ref 4.0–10.5)
nRBC: 0.6 % — ABNORMAL HIGH (ref 0.0–0.2)

## 2019-10-28 NOTE — Plan of Care (Signed)
Goals met 

## 2019-10-28 NOTE — Progress Notes (Signed)
Patient resting well. No signs of acute distress noted. Patient verbalized having no physical needs prior to bedtime.

## 2019-10-28 NOTE — Progress Notes (Signed)
ANTICOAGULATION CONSULT NOTE  Pharmacy Consult:  Warfarin Indication: atrial fibrillation  No Known Allergies  Patient Measurements: Height: 6' (182.9 cm) Weight: 55 kg (121 lb 4.1 oz) IBW/kg (Calculated) : 77.6  Vital Signs: Temp: 98.6 F (37 C) (08/08 0822) BP: 137/83 (08/08 0822) Pulse Rate: 86 (08/08 0822)  Labs: Recent Labs    10/26/19 0315 10/26/19 0315 10/27/19 0132 10/28/19 0144  HGB 7.6*   < > 7.6* 7.5*  HCT 24.4*  --  23.5* 23.9*  PLT 319  --  350 325  LABPROT 33.9*  --  32.0* 36.6*  INR 3.5*  --  3.2* 3.8*  CREATININE 0.59*  --   --   --    < > = values in this interval not displayed.    Estimated Creatinine Clearance: 57.3 mL/min (A) (by C-G formula based on SCr of 0.59 mg/dL (L)).  Assessment: 79 year old male on warfarin PTA for Afib. Warfarin was initially held for partial SBO. Pharmacy consulted to restart warfarin 8/4 after patient passed swallow evaluation.  Outpatient INRs in July were either sub or supratherapeutic, likely due to acute illnesses including GIB in June and labile PO intake.   Warfarin dose PTA: 2.5mg  on Mon/Fri, 5mg  all other days  8/5: Vitamin K 2.5mg  PO x1 INR today 3.8 -  Now trended slightly up and still elevated above goal INR 2-3.     Goal of Therapy:  INR 2-3 Monitor platelets by anticoagulation protocol: Yes   Plan:  Hold warfarin tonight given INR 3.8 Monitor daily INR, CBC/plt Monitor for signs/symptoms of bleeding    *Based on labile PO intake - patient may still have fluctuations in INR. If planning to discharge, would hold warfarin today and do daily INR checks. Once able to resume, likely will need to reduce regimen with frequent INR checks and monitoring of PO intake/status.  Wilson Singer, PharmD PGY1 Pharmacy Resident 10/28/2019 10:52 AM

## 2019-10-28 NOTE — Progress Notes (Signed)
PROGRESS NOTE    [facility]  UUV:[id number] DOB: 1940/06/22 DOA: 10/21/2019 PCP: [name], MD      Brief Narrative:  [name] is a 79 y.o. M with atrial fibrillation on warfarin, history of seizures, dementia,, cirrhosis compensated, CVA, and HTN who presented with generalized malaise, found to have community-acquired pneumonia.  Subsequent hospital stay complicated by ileus.      Assessment & Plan:  Severe sepsis with acute hypoxic respiratory failure due to community-acquired pneumonia Presented with leukocytosis, tachycardia, and fever due to infection in the lung, complicated by acute hypoxic respiratory failure.  Aspiration likely.   Patient now mentating at baseline, taking orals.  Temp < 100 F, heart rate < 100bpm, RR < 24, SpO2 at baseline.      Completed 7 days of doxycycline and Unasyn, no further fever -Continue pulmonary toilet   Ileus Patient noted to have abdominal distention and diarrhea with nausea.  Imaging showed ileus versus partial obstruction.  Clinically it appeared to be an ileus, now resolved    Alcoholic liver cirrhosis, chronic, stable Compensated  Mild dementia  Epilepsy No seizure activity -Continue Dilantin, Keppra  Normocytic anemia Hemoglobin stable at 10.5  Iron studies normal in June.  B12 and folate replete. -Transfusion threshold 7 g/dL  Supratherapeutic INR INR stable on Augmentin -Resume warfarin per protocol pending INR  Paroxysmal atrial fibrillation Heart rate controlled -Continue amiodarone -Consult cardiology, appreciate recommendations -Resume warfarin when INR returns to protocol parameters  Hypomagnesemia Hypokalemia -Check BMP tomorrow   Dysphagia -Continue dysphagia 2 diet High risk for aspiration, multifactorial, most likely due to deconditioning from acute illness as well as baseline dementia and moderate protein calorie malnutrition  Severe protein calorie  malnutrition        Disposition: Status is: Inpatient  Remains inpatient appropriate because:IV treatments appropriate due to intensity of illness or inability to take PO   Dispo: The patient is from: Home              Anticipated d/c is to: SNF              Anticipated d/c date is: When SNF bed available              Patient currently is not medically stable to d/c.  Due to no safe disposition              MDM: The below labs and imaging reports were reviewed and summarized above.  Medication management as above.     DVT prophylaxis: N/a on warfarin  Code Status: FULL Family Communication:    Consultants:   Cardiology  Gen Surg            Subjective: Cough persist, no vomiting or fever.  Noted right arm pain is resolved.  Still generally weak, but no other complaints.  Objective: Vitals:   10/27/19 1259 10/27/19 2026 10/27/19 2232 10/28/19 0822  BP: 124/70 118/70 118/65 137/83  Pulse: 91 92 90 86  Resp: 16 16 16 18   Temp: 98.9 F (37.2 C) 99.1 F (37.3 C) 99 F (37.2 C) 98.6 F (37 C)  TempSrc: Oral  Oral   SpO2: 96% 96% 97% 96%  Weight:      Height:        Intake/Output Summary (Last 24 hours) at 10/28/2019 1648 Last data filed at 10/28/2019 1100 Gross per 24 hour  Intake 240 ml  Output 200 ml  Net 40 ml   Filed Weights   10/21/19 1603  Weight: 55 kg    Examination: General appearance: Thin elderly male, lying in bed, no acute distress     HEENT: Anicteric, conjunctival pink, lids and lashes normal.  No nasal deformity, discharge, epistaxis, oropharynx moist, no oral lesions, dentition in good repair, hearing diminished Skin: Scattered ecchymoses Cardiac: Irregularly irregular, rate normal, no JVD, no lower extremity edema Respiratory: Normal respiratory rate and rhythm, coarse upper airway sounds, no rales or wheezing Abdomen: Abdomen soft no tenderness palpation or guarding, no ascites or distention MSK: Severe diffuse  loss of subcutaneous muscle mass and fat Neuro: Extraocular movements intact, generalized weakness is severe, speech fluent Psych: Attention normal today, affect blunted, judgment insight appear impaired         Data Reviewed: I have personally reviewed following labs and imaging studies:  CBC: Recent Labs  Lab 10/22/19 0415 10/23/19 0140 10/24/19 0212 10/25/19 0107 10/26/19 0315 10/27/19 0132 10/28/19 0144  WBC 25.1*   < > 9.0 9.4 9.8 8.1 8.6  NEUTROABS 23.3*  --   --   --   --   --   --   HGB 8.7*   < > 8.3* 7.5* 7.6* 7.6* 7.5*  HCT 27.8*   < > 27.1* 24.5* 24.4* 23.5* 23.9*  MCV 89.7   < > 89.4 89.1 87.8 87.4 89.2  PLT 226   < > 282 308 319 350 325   < > = values in this interval not displayed.   Basic Metabolic Panel: Recent Labs  Lab 10/21/19 2339 10/22/19 0415 10/23/19 0140 10/24/19 0212 10/25/19 0107 10/26/19 0315  NA  --  136 139 140 139 140  K  --  3.2* 2.8* 3.2* 3.2* 3.3*  CL  --  101 102 106 104 104  CO2  --  25 24 23 27 27   GLUCOSE  --  140* 114* 103* 119* 82  BUN  --  14 11 11  5* 6*  CREATININE  --  0.70 0.79 0.76 0.65 0.59*  CALCIUM  --  8.3* 7.7* 7.7* 7.5* 7.9*  MG 1.5* 1.8  --  1.6*  --   --   PHOS 2.2* 2.1*  --   --   --   --    GFR: Estimated Creatinine Clearance: 57.3 mL/min (A) (by C-G formula based on SCr of 0.59 mg/dL (L)). Liver Function Tests: Recent Labs  Lab 10/22/19 0415 10/23/19 0140 10/24/19 0212 10/25/19 0107 10/26/19 0315  AST 39 34 28 24 23   ALT 22 22 21 21 19   ALKPHOS 69 59 58 52 56  BILITOT 0.9 1.0 1.4* 0.9 0.8  PROT 6.2* 5.4* 5.8* 5.6* 5.6*  ALBUMIN 3.0* 2.6* 2.7* 2.5* 2.6*   No results for input(s): LIPASE, AMYLASE in the last 168 hours. No results for input(s): AMMONIA in the last 168 hours. Coagulation Profile: Recent Labs  Lab 10/24/19 0212 10/25/19 0107 10/26/19 0315 10/27/19 0132 10/28/19 0144  INR 3.9* 6.0* 3.5* 3.2* 3.8*   Cardiac Enzymes: No results for input(s): CKTOTAL, CKMB, CKMBINDEX,  TROPONINI in the last 168 hours. BNP (last 3 results) No results for input(s): PROBNP in the last 8760 hours. HbA1C: No results for input(s): HGBA1C in the last 72 hours. CBG: Recent Labs  Lab 10/27/19 2230 10/28/19 0403 10/28/19 0813 10/28/19 1214 10/28/19 1609  GLUCAP 102* 128* 108* 146* 107*   Lipid Profile: No results for input(s): CHOL, HDL, LDLCALC, TRIG, CHOLHDL, LDLDIRECT in the last 72 hours. Thyroid Function Tests: No results for input(s): TSH, T4TOTAL, FREET4, T3FREE, THYROIDAB  in the last 72 hours. Anemia Panel: No results for input(s): VITAMINB12, FOLATE, FERRITIN, TIBC, IRON, RETICCTPCT in the last 72 hours. Urine analysis:    Component Value Date/Time   COLORURINE YELLOW 10/21/2019 2050   APPEARANCEUR HAZY (A) 10/21/2019 2050   LABSPEC 1.028 10/21/2019 2050   PHURINE 5.0 10/21/2019 2050   GLUCOSEU >=500 (A) 10/21/2019 2050   HGBUR SMALL (A) 10/21/2019 2050   [place] NEGATIVE 10/21/2019 2050   [place] 10/21/2019 2050   PROTEINUR 100 (A) 10/21/2019 2050   UROBILINOGEN 0.2 06/02/2011 0300   NITRITE NEGATIVE 10/21/2019 2050   LEUKOCYTESUR NEGATIVE 10/21/2019 2050   Sepsis Labs: @LABRCNTIP (procalcitonin:4,lacticacidven:4)  ) Recent Results (from the past 240 hour(s))  SARS Coronavirus 2 by RT PCR (hospital order, performed in [place] hospital lab) Nasopharyngeal Nasopharyngeal Swab     Status: None   Collection Time: 10/19/19  8:32 PM   Specimen: Nasopharyngeal Swab  Result Value Ref Range Status   SARS Coronavirus 2 NEGATIVE NEGATIVE Final    Comment: (NOTE) SARS-CoV-2 target nucleic acids are NOT DETECTED.  The SARS-CoV-2 RNA is generally detectable in upper and lower respiratory specimens during the acute phase of infection. The lowest concentration of SARS-CoV-2 viral copies this assay can detect is 250 copies / mL. A negative result does not preclude SARS-CoV-2 infection and should not be used as the sole basis for treatment or  other patient management decisions.  A negative result may occur with improper specimen collection / handling, submission of specimen other than nasopharyngeal swab, presence of viral mutation(s) within the areas targeted by this assay, and inadequate number of viral copies (<250 copies / mL). A negative result must be combined with clinical observations, patient history, and epidemiological information.  Fact Sheet for Patients:   StrictlyIdeas.no  Fact Sheet for Healthcare Providers: BankingDealers.co.za  This test is not yet approved or  cleared by the [place] FDA and has been authorized for detection and/or diagnosis of SARS-CoV-2 by FDA under an Emergency Use Authorization (EUA).  This EUA will remain in effect (meaning this test can be used) for the duration of the COVID-19 declaration under Section 564(b)(1) of the Act, 21 U.S.C. section 360bbb-3(b)(1), unless the authorization is terminated or revoked sooner.  Performed at [facility], [place] [address], [place], [place] [address]   Urine culture     Status: None   Collection Time: 10/19/19  9:50 PM   Specimen: In/Out Cath Urine  Result Value Ref Range Status   Specimen Description IN/OUT CATH URINE  Final   Special Requests NONE  Final   Culture   Final    NO GROWTH Performed at [facility], [place] [address] [place], [place] [address]    Report Status 10/21/2019 FINAL  Final  Blood culture (routine x 2)     Status: None   Collection Time: 10/21/19  5:43 PM   Specimen: BLOOD  Result Value Ref Range Status   Specimen Description BLOOD SITE NOT SPECIFIED  Final   Special Requests   Final    BOTTLES DRAWN AEROBIC AND ANAEROBIC Blood Culture adequate volume   Culture   Final    NO GROWTH 5 DAYS Performed at [facility], [place] [address] [place], [place] [address]    Report Status 10/26/2019 FINAL  Final  Blood culture (routine x 2)     Status: None    Collection Time: 10/21/19  6:11 PM   Specimen: BLOOD LEFT FOREARM  Result Value  Ref Range Status   Specimen Description BLOOD LEFT FOREARM  Final   Special Requests   Final    BOTTLES DRAWN AEROBIC AND ANAEROBIC Blood Culture results may not be optimal due to an inadequate volume of blood received in culture bottles   Culture   Final    NO GROWTH 5 DAYS Performed at [facility], [place] [address] [place], [place] [address]    Report Status 10/26/2019 FINAL  Final  Urine culture     Status: Abnormal   Collection Time: 10/21/19  8:50 PM   Specimen: Urine, Clean Catch  Result Value Ref Range Status   Specimen Description URINE, CLEAN CATCH  Final   Special Requests NONE  Final   Culture (A)  Final    <10,000 COLONIES/mL INSIGNIFICANT GROWTH Performed at [facility], [place] [address] [place], [place] [address]    Report Status 10/22/2019 FINAL  Final  Respiratory Panel by PCR     Status: None   Collection Time: 10/21/19  9:27 PM   Specimen: Nasopharyngeal Swab; Respiratory  Result Value Ref Range Status   Adenovirus NOT DETECTED NOT DETECTED Final   Coronavirus 229E NOT DETECTED NOT DETECTED Final    Comment: (NOTE) The Coronavirus on the Respiratory Panel, DOES NOT test for the novel  Coronavirus (2019 nCoV)    Coronavirus HKU1 NOT DETECTED NOT DETECTED Final   Coronavirus NL63 NOT DETECTED NOT DETECTED Final   Coronavirus OC43 NOT DETECTED NOT DETECTED Final   Metapneumovirus NOT DETECTED NOT DETECTED Final   Rhinovirus / Enterovirus NOT DETECTED NOT DETECTED Final   Influenza A NOT DETECTED NOT DETECTED Final   Influenza B NOT DETECTED NOT DETECTED Final   Parainfluenza Virus 1 NOT DETECTED NOT DETECTED Final   Parainfluenza Virus 2 NOT DETECTED NOT DETECTED Final   Parainfluenza Virus 3 NOT DETECTED NOT DETECTED Final   Parainfluenza Virus 4 NOT DETECTED NOT DETECTED Final   Respiratory Syncytial Virus NOT DETECTED NOT DETECTED Final   Bordetella pertussis  NOT DETECTED NOT DETECTED Final   Chlamydophila pneumoniae NOT DETECTED NOT DETECTED Final   Mycoplasma pneumoniae NOT DETECTED NOT DETECTED Final    Comment: Performed at [name], [place]. [address], [place], [place] [address]  Culture, sputum-assessment     Status: None   Collection Time: 10/21/19 11:44 PM   Specimen: Sputum  Result Value Ref Range Status   Specimen Description SPUTUM  Final   Special Requests COLLECTED IN THE ER  Final   Sputum evaluation   Final    THIS SPECIMEN IS ACCEPTABLE FOR SPUTUM CULTURE Performed at [facility], [address], [place], [place] [address]    Report Status 10/22/2019 FINAL  Final  Culture, respiratory     Status: None   Collection Time: 10/21/19 11:44 PM   Specimen: SPU  Result Value Ref Range Status   Specimen Description SPUTUM  Final   Special Requests COLLECTED IN THE ER Reflexed from [id number]  Final   Gram Stain   Final    RARE WBC PRESENT,BOTH PMN AND MONONUCLEAR FEW GRAM POSITIVE COCCI IN PAIRS FEW GRAM VARIABLE ROD RARE BUDDING YEAST SEEN    Culture   Final    RARE Consistent with normal respiratory flora. Performed at [facility], [place] [address], [place],  [address]    Report Status 10/24/2019 FINAL  Final  MRSA PCR Screening     Status: None   Collection Time: 10/22/19 12:43  AM   Specimen: Nasal Mucosa; Nasopharyngeal  Result Value Ref Range Status   MRSA by PCR NEGATIVE NEGATIVE Final    Comment:        The GeneXpert MRSA Assay (FDA approved for NASAL specimens only), is one component of a comprehensive MRSA colonization surveillance program. It is not intended to diagnose MRSA infection nor to guide or monitor treatment for MRSA infections. Performed at [facility], [place] [address], [place], [place] [address]          Radiology Studies: No results found.      Scheduled Meds: . amiodarone  200 mg Oral Daily  . bisacodyl  10 mg Rectal Once  . docusate sodium  100 mg  Oral BID  . feeding supplement (ENSURE ENLIVE)  237 mL Oral TID BM  . ferrous sulfate  325 mg Oral BID WC  . folic acid  1 mg Oral Daily  . insulin aspart  0-9 Units Subcutaneous Q4H  . levETIRAcetam  1,500 mg Oral BID  . multivitamin with minerals  1 tablet Oral Daily  . pantoprazole (PROTONIX) IV  40 mg Intravenous Q12H  . phenytoin  100 mg Oral BID  . polyethylene glycol  17 g Oral Daily  . sodium chloride flush  3 mL Intravenous Q12H  . sodium phosphate  1 enema Rectal Once  . vitamin B-12  100 mcg Oral Daily  . Warfarin - Pharmacist Dosing Inpatient   Does not apply q1600   Continuous Infusions:    LOS: 6 days    Time spent: 25 minutes    Edwin Dada, MD [facility] 10/28/2019, 4:48 PM     Please page though [place] or Epic secure chat:  For [employer], Adult nurse

## 2019-10-29 ENCOUNTER — Inpatient Hospital Stay (HOSPITAL_COMMUNITY): Payer: Medicare Other

## 2019-10-29 LAB — BASIC METABOLIC PANEL
Anion gap: 8 (ref 5–15)
BUN: 6 mg/dL — ABNORMAL LOW (ref 8–23)
CO2: 30 mmol/L (ref 22–32)
Calcium: 8.2 mg/dL — ABNORMAL LOW (ref 8.9–10.3)
Chloride: 102 mmol/L (ref 98–111)
Creatinine, Ser: 0.61 mg/dL (ref 0.61–1.24)
GFR calc Af Amer: >60 mL/min (ref >60)
GFR calc non Af Amer: >60 mL/min (ref >60)
Glucose, Bld: 111 mg/dL — ABNORMAL HIGH (ref 70–99)
Potassium: 3.3 mmol/L — ABNORMAL LOW (ref 3.5–5.1)
Sodium: 140 mmol/L (ref 135–145)

## 2019-10-29 LAB — CBC
HCT: 24.7 % — ABNORMAL LOW (ref 39.0–52.0)
Hemoglobin: 7.8 g/dL — ABNORMAL LOW (ref 13.0–17.0)
MCH: 27.9 pg (ref 26.0–34.0)
MCHC: 31.6 g/dL (ref 30.0–36.0)
MCV: 88.2 fL (ref 80.0–100.0)
Platelets: 373 10*3/uL (ref 150–400)
RBC: 2.8 MIL/uL — ABNORMAL LOW (ref 4.22–5.81)
RDW: 21 % — ABNORMAL HIGH (ref 11.5–15.5)
WBC: 7.7 10*3/uL (ref 4.0–10.5)
nRBC: 0.7 % — ABNORMAL HIGH (ref 0.0–0.2)

## 2019-10-29 LAB — GLUCOSE, CAPILLARY
Glucose-Capillary: 111 mg/dL — ABNORMAL HIGH (ref 70–99)
Glucose-Capillary: 118 mg/dL — ABNORMAL HIGH (ref 70–99)
Glucose-Capillary: 120 mg/dL — ABNORMAL HIGH (ref 70–99)
Glucose-Capillary: 132 mg/dL — ABNORMAL HIGH (ref 70–99)
Glucose-Capillary: 149 mg/dL — ABNORMAL HIGH (ref 70–99)
Glucose-Capillary: 159 mg/dL — ABNORMAL HIGH (ref 70–99)

## 2019-10-29 LAB — MAGNESIUM: Magnesium: 1.6 mg/dL — ABNORMAL LOW (ref 1.7–2.4)

## 2019-10-29 LAB — PROTIME-INR
INR: 2.8 — ABNORMAL HIGH (ref 0.8–1.2)
Prothrombin Time: 28.2 seconds — ABNORMAL HIGH (ref 11.4–15.2)

## 2019-10-29 MED ORDER — AMOXICILLIN-POT CLAVULANATE 875-125 MG PO TABS
1.0000 | ORAL_TABLET | Freq: Two times a day (BID) | ORAL | Status: DC
  Administered 2019-10-29 – 2019-10-31 (×5): 1 via ORAL
  Filled 2019-10-29 (×5): qty 1

## 2019-10-29 MED ORDER — GUAIFENESIN-DM 100-10 MG/5ML PO SYRP: 5.0000 mL | ORAL_SOLUTION | ORAL | Status: DC | PRN

## 2019-10-29 MED ORDER — MELATONIN 3 MG PO TABS
3.0000 mg | ORAL_TABLET | Freq: Every day | ORAL | Status: DC
  Administered 2019-10-29 – 2019-10-30 (×2): 3 mg via ORAL
  Filled 2019-10-29 (×2): qty 1

## 2019-10-29 MED ORDER — NON FORMULARY: 3.0000 mg | Freq: Every day | Status: DC

## 2019-10-29 MED ORDER — OXYCODONE-ACETAMINOPHEN 5-325 MG PO TABS
0.5000 | ORAL_TABLET | Freq: Four times a day (QID) | ORAL | Status: DC | PRN
  Administered 2019-10-29 – 2019-10-31 (×3): 0.5 via ORAL
  Filled 2019-10-29 (×3): qty 1

## 2019-10-29 MED ORDER — WARFARIN SODIUM 2.5 MG PO TABS
2.5000 mg | ORAL_TABLET | Freq: Once | ORAL | Status: AC
  Administered 2019-10-29: 2.5 mg via ORAL
  Filled 2019-10-29: qty 1

## 2019-10-29 MED ORDER — POTASSIUM CHLORIDE CRYS ER 20 MEQ PO TBCR
20.0000 meq | EXTENDED_RELEASE_TABLET | Freq: Once | ORAL | Status: AC
  Administered 2019-10-29: 20 meq via ORAL
  Filled 2019-10-29: qty 1

## 2019-10-29 NOTE — Progress Notes (Signed)
ANTICOAGULATION CONSULT NOTE  Pharmacy Consult:  Warfarin Indication: atrial fibrillation  No Known Allergies  Patient Measurements: Height: 6' (182.9 cm) Weight: 55 kg (121 lb 4.1 oz) IBW/kg (Calculated) : 77.6  Vital Signs: Temp: 98.3 F (36.8 C) (08/09 0728) Temp Source: Oral (08/08 2206) BP: 134/86 (08/09 0728) Pulse Rate: 93 (08/09 0728)  Labs: Recent Labs    10/27/19 0132 10/27/19 0132 10/28/19 0144 10/29/19 0224  HGB 7.6*   < > 7.5* 7.8*  HCT 23.5*  --  23.9* 24.7*  PLT 350  --  325 373  LABPROT 32.0*  --  36.6* 28.2*  INR 3.2*  --  3.8* 2.8*  CREATININE  --   --   --  0.61   < > = values in this interval not displayed.    Estimated Creatinine Clearance: 57.3 mL/min (by C-G formula based on SCr of 0.61 mg/dL).  Assessment: 79 year old male on warfarin PTA for Afib. Warfarin was initially held for partial SBO. Pharmacy consulted to restart warfarin 8/4 after patient passed swallow evaluation.  Outpatient INRs in July were either sub or supratherapeutic, likely due to acute illnesses including GIB in June and labile PO intake.   Warfarin dose PTA: 2.5mg  on Mon/Fri, 5mg  all other days  8/5: Vitamin K 2.5mg  PO x1 INR today 2.8 -  Within goal INR 2-3.     Goal of Therapy:  INR 2-3 Monitor platelets by anticoagulation protocol: Yes   Plan:  Warfarin 2.5 mg po x 1 tonight Monitor daily INR, CBC/plt Monitor for signs/symptoms of bleeding     Alanda Slim, PharmD, [facility] Clinical Pharmacist Please see AMION for all Pharmacists' Contact Phone Numbers 10/29/2019, 10:09 AM

## 2019-10-29 NOTE — Progress Notes (Signed)
Pt coughing a lot with meds crushed in applesauce.

## 2019-10-29 NOTE — TOC Progression Note (Signed)
Transition of Care [facility]) - Progression Note    Patient Details  Name: Merek Niu MRN: [id number] Date of Birth: 09-Nov-1940  Transition [facility]) CM/SW [place], RN Phone Number: [phone]  10/29/2019, 9:49 AM  Clinical Narrative:    CM has called [facility] and was told by the admissions director that the facility is not taking admissions at this time due to being at Full capacity. CM has attempted to call [facility] several time but the phone just rings with no answer. Will attempt to call later Daughter has been updated.     Expected Discharge Plan: Skilled Nursing Facility Barriers to Discharge: Continued Medical Work up  Expected Discharge Plan and Services Expected Discharge Plan: [place] In-house Referral: NA Discharge Planning Services: CM Consult Post Acute Care Choice: NA Living arrangements for the past 2 months: Single Family Home                 DME Arranged: N/A DME Agency: NA       HH Arranged: NA HH Agency: NA         Social Determinants of Health (SDOH) Interventions    Readmission Risk Interventions Readmission Risk Prevention Plan 09/12/2019 07/25/2018  Transportation Screening Complete Complete  PCP or Specialist Appt within 5-7 Days - Complete  PCP or Specialist Appt within 3-5 Days Complete -  Home Care Screening - Complete  Medication Review (RN CM) - Complete  HRI or Home Care Consult Complete -  Social Work Consult for [place] Planning/Counseling Complete -  Palliative Care Screening Not Applicable -  Medication Review Press photographer) Complete -  Some recent data might be hidden

## 2019-10-29 NOTE — Plan of Care (Signed)
°  Problem: Activity: °Goal: Ability to tolerate increased activity will improve °Outcome: Not Progressing °  °Problem: Clinical Measurements: °Goal: Ability to maintain a body temperature in the normal range will improve °Outcome: Not Progressing °  °Problem: Respiratory: °Goal: Ability to maintain adequate ventilation will improve °Outcome: Not Progressing °Goal: Ability to maintain a clear airway will improve °Outcome: Not Progressing °  °

## 2019-10-29 NOTE — TOC Progression Note (Signed)
Transition of Care [facility]) - Progression Note    Patient Details  Name: [name] MRN: [id number] Date of Birth: 05-22-1940  Transition of Care [facility]) CM/SW [place], RN Phone Number: [phone]  10/29/2019, 2:07 PM  Clinical Narrative:    Daughter [name] called with the names of 3 facilities that she would like CM to check for bed availability. Daughter also states that if CM is not having any luck to begin search in the [place] area. CM called [facility] [phone], [facility] [phone] and [facility] [phone]. Messages left at all facilities. SNF search initiated via the [address] for the [place] area.   Expected Discharge Plan: Skilled Nursing Facility Barriers to Discharge: Continued Medical Work up  Expected Discharge Plan and Services Expected Discharge Plan: [place] In-house Referral: NA Discharge Planning Services: CM Consult Post Acute Care Choice: NA Living arrangements for the past 2 months: Single Family Home                 DME Arranged: N/A DME Agency: NA       HH Arranged: NA HH Agency: NA         Social Determinants of Health (SDOH) Interventions    Readmission Risk Interventions Readmission Risk Prevention Plan 09/12/2019 07/25/2018  Transportation Screening Complete Complete  PCP or Specialist Appt within 5-7 Days - Complete  PCP or Specialist Appt within 3-5 Days Complete -  Home Care Screening - Complete  Medication Review (RN CM) - Complete  HRI or Home Care Consult Complete -  Social Work Consult for [place] Planning/Counseling Complete -  Palliative Care Screening Not Applicable -  Medication Review Press photographer) Complete -  Some recent data might be hidden

## 2019-10-29 NOTE — Progress Notes (Signed)
Occupational Therapy Treatment Patient Details Name: [name] MRN: [id number] DOB: Aug 09, 1940 Today's Date: 10/29/2019    History of present illness Pt is an 79 y/o male admitted secondary to generalized weakness and fever. Thought to be from CAP. Pt also with A fib and possible bowel obstruction vs. ileus (that is resolving). PMH includes HTN, seizures, dementia, a fib, and CVA.   OT comments  Pt progressing gradually with OT goals. Pt with no reports of pain today and demonstrates ability to reach B UE functionally and without difficulty. Pt greatly limited during today's session due to decreased safety awareness and ability to follow safety instructions. Pt required Max A x 2 for stand pivot to [facility] after experiencing bowel incontinence, attempting to sit prematurely and requiring therapist physical intervention to prevent fall. Pt Max A for LB dressing and toileting tasks today. Continue to strongly recommend SNF for rehab due to safety concerns and fall risk.    Follow Up Recommendations  SNF;Supervision/Assistance - 24 hour;Other (comment) (HHOT and [place] aide w/ 24/7 assist if family refuses SNF)    Equipment Recommendations  3 in 1 bedside commode    Recommendations for Other Services      Precautions / Restrictions Precautions Precautions: Fall Restrictions Weight Bearing Restrictions: No       Mobility Bed Mobility Overal bed mobility: Needs Assistance Bed Mobility: Supine to Sit     Supine to sit: Min guard     General bed mobility comments: increased time, cueing for hand placement.  Transfers Overall transfer level: Needs assistance Equipment used: Rolling walker (2 wheeled) Transfers: Sit to/from [facility] Sit to Stand: Max assist;+2 physical assistance;+2 safety/equipment Stand pivot transfers: Max assist;+2 safety/equipment;+2 physical assistance       General transfer comment: Pt varied from Mod A x 1 to Max A x 2 for various sit  to stand trials, max cueing needed for safety and hand placement. Pt Max A x 2 for stand pivot to [facility] due to safety concerns    Balance Overall balance assessment: Needs assistance Sitting-balance support: Feet supported Sitting balance-Leahy Scale: Poor Sitting balance - Comments: Posterior lean, grabbing knees to stay upright. Did not follow commands to lean forward Postural control: Posterior lean Standing balance support: Bilateral upper extremity supported;During functional activity Standing balance-Leahy Scale: Poor Standing balance comment: Requiring mod A for balance, posterior bias                           ADL either performed or assessed with clinical judgement   ADL Overall ADL's : Needs assistance/impaired                     Lower Body Dressing: Sit to/from stand;Maximal assistance Lower Body Dressing Details (indicate cue type and reason): Max A to don socks sitting in Theme park manager Transfer: Maximal assistance;+2 for physical assistance;+2 for safety/equipment;Stand-pivot;BSC;RW Toilet Transfer Details (indicate cue type and reason): Max A x 2 for pivot to [facility] with pt impulsivity and poor safety awareness. Pt high fall risk and not following therapist's instructions for safety Toileting- Clothing Manipulation and Hygiene: Maximal assistance;Sit to/from stand Toileting - Clothing Manipulation Details (indicate cue type and reason): Max A for toileting hygiene after bowel incontinence       General ADL Comments: Pt with very poor safety awareness and high fall risk due to not following therapist's instructions for safety. Pt with no reports of pain during session with  good ability to reach B UE     Vision   Vision Assessment?: No apparent visual deficits   Perception     Praxis      Cognition Arousal/Alertness: Awake/alert Behavior During Therapy: Impulsive Overall Cognitive Status: Impaired/Different from baseline Area of Impairment:  Safety/judgement;Following commands;Problem solving;Memory                   Current Attention Level: Sustained Memory: Decreased short-term memory Following Commands: Follows one step commands inconsistently;Follows one step commands with increased time;Follows multi-step commands inconsistently;Follows multi-step commands with increased time Safety/Judgement: Decreased awareness of safety;Decreased awareness of deficits Awareness: Emergent Problem Solving: Difficulty sequencing;Requires verbal cues;Slow processing;Requires tactile cues General Comments: Dementia at baseline, inconsistently follows cues. Impulsive during transfers. Poor safety awareness.        Exercises     Shoulder Instructions       General Comments No reports of pain, VSS on RA.     Pertinent Vitals/ Pain       Pain Assessment: Faces Pain Score: 0-No pain Faces Pain Scale: No hurt Pain Intervention(s): Monitored during session  Home Living                                          Prior Functioning/Environment              Frequency  Min 2X/week        Progress Toward Goals  OT Goals(current goals can now be found in the care plan section)  Progress towards OT goals: Progressing toward goals  Acute Rehab OT Goals Patient Stated Goal: for shoulder to stop hurting OT Goal Formulation: With patient Time For Goal Achievement: 11/06/19 Potential to Achieve Goals: Good ADL Goals Pt Will Perform Upper Body Dressing: with supervision;sitting Pt Will Perform Lower Body Dressing: with mod assist;sit to/from stand Additional ADL Goal #1: Pt to demonstrate ability to sit EOB for 5 minutes without external support from therapist during ADL tasks Additional ADL Goal #2: Pt to complete sit to stand transfer with Mod A and RW in preparation for ADL transfers  Plan Discharge plan remains appropriate    Co-evaluation    PT/OT/SLP Co-Evaluation/Treatment: Yes Reason for  Co-Treatment: Complexity of the patient's impairments (multi-system involvement);Necessary to address cognition/behavior during functional activity;For patient/therapist safety;To address functional/ADL transfers PT goals addressed during session: Mobility/safety with mobility;Balance OT goals addressed during session: ADL's and self-care      AM-PAC OT "6 Clicks" Daily Activity     Outcome Measure   Help from another person eating meals?: A Little Help from another person taking care of personal grooming?: A Little Help from another person toileting, which includes using toliet, bedpan, or urinal?: A Lot Help from another person bathing (including washing, rinsing, drying)?: A Lot Help from another person to put on and taking off regular upper body clothing?: A Little Help from another person to put on and taking off regular lower body clothing?: A Lot 6 Click Score: 15    End of Session Equipment Utilized During Treatment: Gait belt;Rolling walker  OT Visit Diagnosis: Unsteadiness on feet (R26.81);Other abnormalities of gait and mobility (R26.89);Muscle weakness (generalized) (M62.81);History of falling (Z91.81)   Activity Tolerance Patient tolerated treatment well   Patient Left in chair;with call bell/phone within reach;with chair alarm set   Nurse Communication Mobility status        Time: [id number] OT  Time Calculation (min): 27 min  Charges: OT General Charges $OT Visit: 1 Visit OT Treatments $Self Care/Home Management : 8-22 mins  Layla Maw, OTR/L   Layla Maw 10/29/2019, 12:41 PM

## 2019-10-29 NOTE — Progress Notes (Signed)
PROGRESS NOTE    Varnell Orvis [facility]  PYK:[id number] DOB: 1940/05/21 DOA: 10/21/2019 PCP: [name], MD      Brief Narrative:  Mr. Montilla is a 79 y.o. M with atrial fibrillation on warfarin, history of seizures, dementia,, cirrhosis compensated, CVA, and HTN who presented with generalized malaise, found to have community-acquired pneumonia.  Subsequent hospital stay complicated by ileus.      Assessment & Plan:  Severe sepsis with acute hypoxic respiratory failure due to community-acquired pneumonia Presented with leukocytosis, tachycardia, and fever due to infection in the lung, complicated by acute hypoxic respiratory failure.  Aspiration likely.    Patient now mentating at baseline, taking orals.  Temp remains < 100 F, heart rate < 100bpm, RR < 24, SpO2 at baseline.      Completed 7 days of doxycycline and Unasyn.  Given his age, debility, poor respiratory reserve, and malnutrition, I feel he is high risk for treatment failure and will extend his treatment course to 10-14 days  -Stop Unasyn -Start Augmentin, day 8 of antibiotics -Continue pulmonary toilet    ADDENDUM: This afternoon, patient complaining of fatigue, back discomfort.  CXR repeated and showed bilateral effusions.  These are likely parapneumonic, may resolve, but I will order a thoracentesis to speed his recovery, because, as above, I feel he is high risk for treatment failure due to malnutrition. -US thoracentesis ordered -Repeat CXR in 2-4 weeks   Ileus, resolved Patient noted to have abdominal distention and diarrhea with nausea around 8/1.  Imaging showed ileus versus partial obstruction.  Clinically it appeared to be an ileus, resolved with conservative measures, and now appears to tolerate PO without vomiting.  Risk for recurrence noted.    Alcoholic liver cirrhosis, chronic, stable Compensated  Mild dementia  Epilepsy No seizure activity -Continue Dilantin, Keppra  Normocytic  anemia Hemoglobin stable at 7.8 g/dL  Iron studies normal in June.  B12 and folate replete. -Transfusion threshold 7 g/dL  Supratherapeutic INR Resolved  Paroxysmal A. Fib Heart rate normal -COntinue amiodarone, warfarin  Hypomagnesemia Hypokalemia -Replete K   Dysphagia -Continue dysphagia 2 diet High risk for aspiration, multifactorial, most likely due to deconditioning from acute illness as well as baseline dementia and moderate protein calorie malnutrition  Severe protein calorie malnutrition        Disposition: Status is: Inpatient  Remains inpatient appropriate because:IV treatments appropriate due to intensity of illness or inability to take PO   Dispo: The patient is from: Home              Anticipated d/c is to: SNF              Anticipated d/c date is: When SNF bed available              Patient currently is not medically stable to d/c.  Due to no safe disposition    Patient was admitted for aspiration pneumonia.  Illness complicated by malnutrition and ileus.  Now stable for transfer to a lower level of care, pending bed availability          MDM: The below labs and imaging reports were reviewed and summarized above.  Medication management as above.     DVT prophylaxis: N/a on warfarin  Code Status: FULL Family Communication:  Daughter by phone  Consultants:   Cardiology  Gen Surg            Subjective: Cough persist, no vomiting or fever.  Noted right arm pain is resolved.  Still  generally weak, but no other complaints.  Objective: Vitals:   10/28/19 2206 10/29/19 0728 10/29/19 1459 10/29/19 1700  BP: 126/63 134/86 127/71 122/72  Pulse: 96 93 97 96  Resp: 20 20 20 19   Temp: 98.8 F (37.1 C) 98.3 F (36.8 C)  97.8 F (36.6 C)  TempSrc: Oral     SpO2: 94% 98% 96% 95%  Weight:      Height:        Intake/Output Summary (Last 24 hours) at 10/29/2019 2018 Last data filed at 10/29/2019 1700 Gross per 24 hour  Intake  --  Output 800 ml  Net -800 ml   Filed Weights   10/21/19 1603  Weight: 55 kg    Examination: General appearance: Thin elderly male, sitting in recliner, no acute distress     HEENT: Anicteric, conjunctival pink, lids and lashes normal.  No nasal deformity, discharge, epistaxis, oropharynx moist, no oral lesions, dentition in good repair, hearing diminished Skin: Scattered ecchymoses Cardiac: Irregularly irregular, rate normal, no JVD, no lower extremity edema Respiratory: Normal respiratory rate and rhythm, coarse upper airway sounds, no rales or wheezing, diminished at bases Abdomen: Abdomen soft no tenderness palpation or guarding, no ascites or distention MSK: Severe diffuse loss of subcutaneous muscle mass and fat Neuro: Extraocular movements intact, generalized weakness is severe, speech fluent Psych: Attention normal today, affect blunted, judgment insight appear impaired         Data Reviewed: I have personally reviewed following labs and imaging studies:  CBC: Recent Labs  Lab 10/25/19 0107 10/26/19 0315 10/27/19 0132 10/28/19 0144 10/29/19 0224  WBC 9.4 9.8 8.1 8.6 7.7  HGB 7.5* 7.6* 7.6* 7.5* 7.8*  HCT 24.5* 24.4* 23.5* 23.9* 24.7*  MCV 89.1 87.8 87.4 89.2 88.2  PLT 308 319 350 325 789   Basic Metabolic Panel: Recent Labs  Lab 10/23/19 0140 10/24/19 0212 10/25/19 0107 10/26/19 0315 10/29/19 0224  NA 139 140 139 140 140  K 2.8* 3.2* 3.2* 3.3* 3.3*  CL 102 106 104 104 102  CO2 24 23 27 27 30   GLUCOSE 114* 103* 119* 82 111*  BUN 11 11 5* 6* 6*  CREATININE 0.79 0.76 0.65 0.59* 0.61  CALCIUM 7.7* 7.7* 7.5* 7.9* 8.2*  MG  --  1.6*  --   --  1.6*   GFR: Estimated Creatinine Clearance: 57.3 mL/min (by C-G formula based on SCr of 0.61 mg/dL). Liver Function Tests: Recent Labs  Lab 10/23/19 0140 10/24/19 0212 10/25/19 0107 10/26/19 0315  AST 34 28 24 23   ALT 22 21 21 19   ALKPHOS 59 58 52 56  BILITOT 1.0 1.4* 0.9 0.8  PROT 5.4* 5.8* 5.6* 5.6*   ALBUMIN 2.6* 2.7* 2.5* 2.6*   No results for input(s): LIPASE, AMYLASE in the last 168 hours. No results for input(s): AMMONIA in the last 168 hours. Coagulation Profile: Recent Labs  Lab 10/25/19 0107 10/26/19 0315 10/27/19 0132 10/28/19 0144 10/29/19 0224  INR 6.0* 3.5* 3.2* 3.8* 2.8*   Cardiac Enzymes: No results for input(s): CKTOTAL, CKMB, CKMBINDEX, TROPONINI in the last 168 hours. BNP (last 3 results) No results for input(s): PROBNP in the last 8760 hours. HbA1C: No results for input(s): HGBA1C in the last 72 hours. CBG: Recent Labs  Lab 10/28/19 2340 10/29/19 0350 10/29/19 0724 10/29/19 1138 10/29/19 1656  GLUCAP 106* 118* 120* 132* 111*   Lipid Profile: No results for input(s): CHOL, HDL, LDLCALC, TRIG, CHOLHDL, LDLDIRECT in the last 72 hours. Thyroid Function Tests: No results for  input(s): TSH, T4TOTAL, FREET4, T3FREE, THYROIDAB in the last 72 hours. Anemia Panel: No results for input(s): VITAMINB12, FOLATE, FERRITIN, TIBC, IRON, RETICCTPCT in the last 72 hours. Urine analysis:    Component Value Date/Time   COLORURINE YELLOW 10/21/2019 2050   APPEARANCEUR HAZY (A) 10/21/2019 2050   LABSPEC 1.028 10/21/2019 2050   PHURINE 5.0 10/21/2019 2050   GLUCOSEU >=500 (A) 10/21/2019 2050   HGBUR SMALL (A) 10/21/2019 2050   [place] NEGATIVE 10/21/2019 2050   [place] 10/21/2019 2050   PROTEINUR 100 (A) 10/21/2019 2050   UROBILINOGEN 0.2 06/02/2011 0300   NITRITE NEGATIVE 10/21/2019 2050   LEUKOCYTESUR NEGATIVE 10/21/2019 2050   Sepsis Labs: @LABRCNTIP (procalcitonin:4,lacticacidven:4)  ) Recent Results (from the past 240 hour(s))  SARS Coronavirus 2 by RT PCR (hospital order, performed in [place] hospital lab) Nasopharyngeal Nasopharyngeal Swab     Status: None   Collection Time: 10/19/19  8:32 PM   Specimen: Nasopharyngeal Swab  Result Value Ref Range Status   SARS Coronavirus 2 NEGATIVE NEGATIVE Final    Comment: (NOTE) SARS-CoV-2  target nucleic acids are NOT DETECTED.  The SARS-CoV-2 RNA is generally detectable in upper and lower respiratory specimens during the acute phase of infection. The lowest concentration of SARS-CoV-2 viral copies this assay can detect is 250 copies / mL. A negative result does not preclude SARS-CoV-2 infection and should not be used as the sole basis for treatment or other patient management decisions.  A negative result may occur with improper specimen collection / handling, submission of specimen other than nasopharyngeal swab, presence of viral mutation(s) within the areas targeted by this assay, and inadequate number of viral copies (<250 copies / mL). A negative result must be combined with clinical observations, patient history, and epidemiological information.  Fact Sheet for Patients:   StrictlyIdeas.no  Fact Sheet for Healthcare Providers: BankingDealers.co.za  This test is not yet approved or  cleared by the [place] FDA and has been authorized for detection and/or diagnosis of SARS-CoV-2 by FDA under an Emergency Use Authorization (EUA).  This EUA will remain in effect (meaning this test can be used) for the duration of the COVID-19 declaration under Section 564(b)(1) of the Act, 21 U.S.C. section 360bbb-3(b)(1), unless the authorization is terminated or revoked sooner.  Performed at [facility], [place] [address], [place], [place] [address]   Urine culture     Status: None   Collection Time: 10/19/19  9:50 PM   Specimen: In/Out Cath Urine  Result Value Ref Range Status   Specimen Description IN/OUT CATH URINE  Final   Special Requests NONE  Final   Culture   Final    NO GROWTH Performed at [facility], [place] [address] [place], [place] [address]    Report Status 10/21/2019 FINAL  Final  Blood culture (routine x 2)     Status: None   Collection Time: 10/21/19  5:43 PM   Specimen: BLOOD  Result Value  Ref Range Status   Specimen Description BLOOD SITE NOT SPECIFIED  Final   Special Requests   Final    BOTTLES DRAWN AEROBIC AND ANAEROBIC Blood Culture adequate volume   Culture   Final    NO GROWTH 5 DAYS Performed at [facility], [place] [address], [place],  [address]    Report Status 10/26/2019 FINAL  Final  Blood culture (routine x 2)     Status: None   Collection Time: 10/21/19  6:11 PM   Specimen:  BLOOD LEFT FOREARM  Result Value Ref Range Status   Specimen Description BLOOD LEFT FOREARM  Final   Special Requests   Final    BOTTLES DRAWN AEROBIC AND ANAEROBIC Blood Culture results may not be optimal due to an inadequate volume of blood received in culture bottles   Culture   Final    NO GROWTH 5 DAYS Performed at [facility], [place] [address], [place], [place] [address]    Report Status 10/26/2019 FINAL  Final  Urine culture     Status: Abnormal   Collection Time: 10/21/19  8:50 PM   Specimen: Urine, Clean Catch  Result Value Ref Range Status   Specimen Description URINE, CLEAN CATCH  Final   Special Requests NONE  Final   Culture (A)  Final    <10,000 COLONIES/mL INSIGNIFICANT GROWTH Performed at [facility], [place] [address], [place], [place] [address]    Report Status 10/22/2019 FINAL  Final  Respiratory Panel by PCR     Status: None   Collection Time: 10/21/19  9:27 PM   Specimen: Nasopharyngeal Swab; Respiratory  Result Value Ref Range Status   Adenovirus NOT DETECTED NOT DETECTED Final   Coronavirus 229E NOT DETECTED NOT DETECTED Final    Comment: (NOTE) The Coronavirus on the Respiratory Panel, DOES NOT test for the novel  Coronavirus (2019 nCoV)    Coronavirus HKU1 NOT DETECTED NOT DETECTED Final   Coronavirus NL63 NOT DETECTED NOT DETECTED Final   Coronavirus OC43 NOT DETECTED NOT DETECTED Final   Metapneumovirus NOT DETECTED NOT DETECTED Final   Rhinovirus / Enterovirus NOT DETECTED NOT DETECTED Final   Influenza A NOT DETECTED NOT  DETECTED Final   Influenza B NOT DETECTED NOT DETECTED Final   Parainfluenza Virus 1 NOT DETECTED NOT DETECTED Final   Parainfluenza Virus 2 NOT DETECTED NOT DETECTED Final   Parainfluenza Virus 3 NOT DETECTED NOT DETECTED Final   Parainfluenza Virus 4 NOT DETECTED NOT DETECTED Final   Respiratory Syncytial Virus NOT DETECTED NOT DETECTED Final   Bordetella pertussis NOT DETECTED NOT DETECTED Final   Chlamydophila pneumoniae NOT DETECTED NOT DETECTED Final   Mycoplasma pneumoniae NOT DETECTED NOT DETECTED Final    Comment: Performed at [facility], [place]. [address], [place], [place] [address]  Culture, sputum-assessment     Status: None   Collection Time: 10/21/19 11:44 PM   Specimen: Sputum  Result Value Ref Range Status   Specimen Description SPUTUM  Final   Special Requests COLLECTED IN THE ER  Final   Sputum evaluation   Final    THIS SPECIMEN IS ACCEPTABLE FOR SPUTUM CULTURE Performed at [facility], [address] [place], [place] [address]    Report Status 10/22/2019 FINAL  Final  Culture, respiratory     Status: None   Collection Time: 10/21/19 11:44 PM   Specimen: SPU  Result Value Ref Range Status   Specimen Description SPUTUM  Final   Special Requests COLLECTED IN THE ER Reflexed from [id number]  Final   Gram Stain   Final    RARE WBC PRESENT,BOTH PMN AND MONONUCLEAR FEW GRAM POSITIVE COCCI IN PAIRS FEW GRAM VARIABLE ROD RARE BUDDING YEAST SEEN    Culture   Final    RARE Consistent with normal respiratory flora. Performed at [facility], [place] [address] [place], [place] [address]    Report Status 10/24/2019 FINAL  Final  MRSA PCR Screening     Status: None  Collection Time: 10/22/19 12:43 AM   Specimen: Nasal Mucosa; Nasopharyngeal  Result Value Ref Range Status   MRSA by PCR NEGATIVE NEGATIVE Final    Comment:        The GeneXpert MRSA Assay (FDA approved for NASAL specimens only), is one component of a comprehensive MRSA  colonization surveillance program. It is not intended to diagnose MRSA infection nor to guide or monitor treatment for MRSA infections. Performed at [facility], [place] [address] [place], [place] [address]          Radiology Studies: DG Chest 2 View  Result Date: 10/29/2019 CLINICAL DATA:  Pt c/o right shoulder pain and weakness x 1 day. No injury to the shoulder. Axillary view was unobtainable due to patient condition. Hx of DM, HTN, AND stroke. Pt is a nonsmoker. EXAM: CHEST - 2 VIEW COMPARISON:  Radiograph 10/21/2019 FINDINGS: Stable enlarged cardiac silhouette. There is increased bilateral pleural effusions. Lungs are hyperinflated. No focal consolidation. No pneumothorax. IMPRESSION: New bilateral pleural effusions. Hyperinflated lungs. Electronically Signed   By: Suzy Bouchard [name]   On: 10/29/2019 16:55   DG Shoulder Right  Result Date: 10/29/2019 CLINICAL DATA:  Right shoulder pain. No known injury. EXAM: RIGHT SHOULDER - 2+ VIEW COMPARISON:  None. FINDINGS: Superior subluxation of the humeral head abutting the undersurface of the acromion. Mild glenohumeral spurring. Mild subacromial spurring. Acromioclavicular degenerative change with inferiorly directed osteophytes. No evidence of fracture, erosion, or focal bone lesion. Diffuse bony under mineralization. No focal soft tissue abnormality. IMPRESSION: 1. Superior subluxation of the humeral head abutting the undersurface of the acromion, consistent with rotator cuff arthropathy. 2. Mild glenohumeral and acromioclavicular degenerative change. Electronically Signed   By: Keith Rake [name]   On: 10/29/2019 17:06        Scheduled Meds: . amiodarone  200 mg Oral Daily  . amoxicillin-clavulanate  1 tablet Oral Q12H  . bisacodyl  10 mg Rectal Once  . docusate sodium  100 mg Oral BID  . feeding supplement (ENSURE ENLIVE)  237 mL Oral TID BM  . ferrous sulfate  325 mg Oral BID WC  . folic acid  1 mg Oral Daily  .  levETIRAcetam  1,500 mg Oral BID  . melatonin  3 mg Oral QHS  . multivitamin with minerals  1 tablet Oral Daily  . pantoprazole (PROTONIX) IV  40 mg Intravenous Q12H  . phenytoin  100 mg Oral BID  . polyethylene glycol  17 g Oral Daily  . sodium chloride flush  3 mL Intravenous Q12H  . sodium phosphate  1 enema Rectal Once  . vitamin B-12  100 mcg Oral Daily  . Warfarin - Pharmacist Dosing Inpatient   Does not apply q1600   Continuous Infusions:    LOS: 7 days    Time spent: 25 minutes    Edwin Dada, MD [facility] 10/29/2019, 8:18 PM     Please page though [place] or Epic secure chat:  For [employer], Adult nurse

## 2019-10-29 NOTE — Progress Notes (Signed)
Physical Therapy Treatment Patient Details Name: [name] MRN: [id number] DOB: 1940-12-05 Today's Date: 10/29/2019    History of Present Illness Pt is an 79 y/o male admitted secondary to generalized weakness and fever. Thought to be from CAP. Pt also with A fib and possible bowel obstruction vs. ileus (that is resolving). PMH includes HTN, seizures, dementia, a fib, and CVA.    PT Comments    Patient was laying in hospital bed at start of PT/OT session. Noted his voice was more hoarse this morning than last week. Required min guard assistance for bed mobility to sit EOB. Significant posterior lean and grabbing of knees to maintain upright position. When transferring sit <> stand patient experience bowel incontinence, repositioned the patient back to the bed then stand pivot transferred to [facility] w/ RW. Followed commands very inconsistently, required MAX cueing, and was very impulsive during transfer, requiring MaxA+2. He then did another sit <> stand transfer, swapping [facility] for recliner chair. See below for all assistance levels. While sitting up we worked on cross-body reaches to help activate abdominals to help reduce his posterior lean in seated. He was left with all needs within reach and chair alarm set.     Follow Up Recommendations  SNF     Equipment Recommendations  Rolling walker with 5" wheels    Recommendations for Other Services       Precautions / Restrictions Precautions Precautions: Fall Restrictions Weight Bearing Restrictions: No    Mobility  Bed Mobility Overal bed mobility: Needs Assistance Bed Mobility: Supine to Sit     Supine to sit: Min guard     General bed mobility comments: increased time, cueing for hand placement.  Transfers Overall transfer level: Needs assistance Equipment used: Rolling walker (2 wheeled) Transfers: Sit to/from [facility] Sit to Stand: Max assist;Mod assist;+2 safety/equipment Stand pivot transfers:  Max assist;+2 safety/equipment       General transfer comment: MaxA+2 for initial STS, then ModA+2 for additional transfers. Required cueing to push up from bed instead of RW. Inconsistent command following during EOB to [facility] transfer.  Ambulation/Gait             General Gait Details: not tested   Stairs             Wheelchair Mobility    Modified Rankin (Stroke Patients Only)       Balance Overall balance assessment: Needs assistance Sitting-balance support: Feet supported Sitting balance-Leahy Scale: Poor Sitting balance - Comments: Posterior lean, grabbing knees to stay upright. Did not follow commands to lean forward Postural control: Posterior lean Standing balance support: Bilateral upper extremity supported;During functional activity Standing balance-Leahy Scale: Poor Standing balance comment: Requiring mod A for balance, posterior bias                            Cognition Arousal/Alertness: Awake/alert Behavior During Therapy: Impulsive Overall Cognitive Status: Impaired/Different from baseline Area of Impairment: Safety/judgement;Following commands;Problem solving;Memory                   Current Attention Level: Sustained   Following Commands: Follows one step commands inconsistently;Follows one step commands with increased time;Follows multi-step commands inconsistently;Follows multi-step commands with increased time Safety/Judgement: Decreased awareness of safety;Decreased awareness of deficits Awareness: Emergent Problem Solving: Difficulty sequencing;Requires verbal cues;Slow processing;Requires tactile cues General Comments: Dementia at baseline, inconsistently follows cues. Impulsive during transfers. Poor safety awareness.      Exercises  General Comments        Pertinent Vitals/Pain Pain Assessment: No/denies pain Pain Score: 0-No pain Faces Pain Scale: No hurt Pain Intervention(s): Monitored during session     Home Living                      Prior Function            PT Goals (current goals can now be found in the care plan section) Acute Rehab PT Goals Patient Stated Goal: for shoulder to stop hurting PT Goal Formulation: With patient Time For Goal Achievement: 11/05/19 Potential to Achieve Goals: Good Progress towards PT goals: Progressing toward goals    Frequency    Min 2X/week      PT Plan Current plan remains appropriate    Co-evaluation PT/OT/SLP Co-Evaluation/Treatment: Yes Reason for Co-Treatment: For patient/therapist safety;Necessary to address cognition/behavior during functional activity;To address functional/ADL transfers PT goals addressed during session: Mobility/safety with mobility;Balance        AM-PAC PT "6 Clicks" Mobility   Outcome Measure  Help needed turning from your back to your side while in a flat bed without using bedrails?: A Little Help needed moving from lying on your back to sitting on the side of a flat bed without using bedrails?: A Lot Help needed moving to and from a bed to a chair (including a wheelchair)?: A Lot Help needed standing up from a chair using your arms (e.g., wheelchair or bedside chair)?: A Lot Help needed to walk in hospital room?: Total Help needed climbing 3-5 steps with a railing? : Total 6 Click Score: 11    End of Session Equipment Utilized During Treatment: Gait belt Activity Tolerance: Patient tolerated treatment well Patient left: in chair;with call bell/phone within reach;with chair alarm set   PT Visit Diagnosis: Unsteadiness on feet (R26.81);Muscle weakness (generalized) (M62.81);Difficulty in walking, not elsewhere classified (R26.2)     Time:  -     Charges:                       Livingston Diones, SPT, ATC

## 2019-10-30 ENCOUNTER — Inpatient Hospital Stay (HOSPITAL_COMMUNITY): Payer: Medicare Other

## 2019-10-30 HISTORY — PX: IR THORACENTESIS ASP PLEURAL SPACE W/IMG GUIDE: IMG5380

## 2019-10-30 LAB — CBC
HCT: 25.3 % — ABNORMAL LOW (ref 39.0–52.0)
Hemoglobin: 7.8 g/dL — ABNORMAL LOW (ref 13.0–17.0)
MCH: 27 pg (ref 26.0–34.0)
MCHC: 30.8 g/dL (ref 30.0–36.0)
MCV: 87.5 fL (ref 80.0–100.0)
Platelets: 364 10*3/uL (ref 150–400)
RBC: 2.89 MIL/uL — ABNORMAL LOW (ref 4.22–5.81)
RDW: 21.1 % — ABNORMAL HIGH (ref 11.5–15.5)
WBC: 7.4 10*3/uL (ref 4.0–10.5)
nRBC: 0.7 % — ABNORMAL HIGH (ref 0.0–0.2)

## 2019-10-30 LAB — COMPREHENSIVE METABOLIC PANEL
ALT: 17 U/L (ref 0–44)
AST: 22 U/L (ref 15–41)
Albumin: 2.7 g/dL — ABNORMAL LOW (ref 3.5–5.0)
Alkaline Phosphatase: 66 U/L (ref 38–126)
Anion gap: 9 (ref 5–15)
BUN: 7 mg/dL — ABNORMAL LOW (ref 8–23)
CO2: 30 mmol/L (ref 22–32)
Calcium: 8.5 mg/dL — ABNORMAL LOW (ref 8.9–10.3)
Chloride: 100 mmol/L (ref 98–111)
Creatinine, Ser: 0.63 mg/dL (ref 0.61–1.24)
GFR calc Af Amer: >60 mL/min (ref >60)
GFR calc non Af Amer: >60 mL/min (ref >60)
Glucose, Bld: 127 mg/dL — ABNORMAL HIGH (ref 70–99)
Potassium: 3.5 mmol/L (ref 3.5–5.1)
Sodium: 139 mmol/L (ref 135–145)
Total Bilirubin: 0.7 mg/dL (ref 0.3–1.2)
Total Protein: 5.8 g/dL — ABNORMAL LOW (ref 6.5–8.1)

## 2019-10-30 LAB — GLUCOSE, CAPILLARY
Glucose-Capillary: 111 mg/dL — ABNORMAL HIGH (ref 70–99)
Glucose-Capillary: 146 mg/dL — ABNORMAL HIGH (ref 70–99)

## 2019-10-30 LAB — SARS CORONAVIRUS 2 (TAT 6-24 HRS): SARS Coronavirus 2: NEGATIVE

## 2019-10-30 LAB — PROTIME-INR
INR: 2.1 — ABNORMAL HIGH (ref 0.8–1.2)
Prothrombin Time: 22.7 seconds — ABNORMAL HIGH (ref 11.4–15.2)

## 2019-10-30 MED ORDER — WARFARIN SODIUM 5 MG PO TABS
5.0000 mg | ORAL_TABLET | Freq: Once | ORAL | Status: AC
  Administered 2019-10-30: 5 mg via ORAL
  Filled 2019-10-30: qty 1

## 2019-10-30 MED ORDER — LIDOCAINE HCL 1 % IJ SOLN
INTRAMUSCULAR | Status: AC
  Filled 2019-10-30: qty 20

## 2019-10-30 MED ORDER — LIDOCAINE HCL (PF) 1 % IJ SOLN
INTRAMUSCULAR | Status: AC | PRN
  Administered 2019-10-30: 10 mL

## 2019-10-30 NOTE — Progress Notes (Signed)
Patient eating at this time. Will attempt CPT at later time.

## 2019-10-30 NOTE — Procedures (Signed)
PROCEDURE SUMMARY:  Successful US guided right thoracentesis. Yielded 400 ml of clear yellow fluid. Pt tolerated procedure well. No immediate complications.  CXR ordered; no post-procedure pneumothorax.   EBL < 5 mL  Theresa Duty, NP 10/30/2019 11:37 AM

## 2019-10-30 NOTE — Progress Notes (Addendum)
PROGRESS NOTE    [name] [facility]  CZY:[id number] DOB: 03/05/1941 DOA: 10/21/2019 PCP: [name], MD      Brief Narrative:  [name] is a 79 y.o. M with atrial fibrillation on warfarin, history of seizures, dementia,, cirrhosis compensated, CVA, and HTN was admitted with CAP which has been complicated by ileus.   Patient was scheduled for discharge however developed severe back pain.  Work-up revealed new bilateral pleural effusions, parapneumonic and thoracentesis was scheduled for today.  Assessment & Plan:  Severe sepsis with acute hypoxic respiratory failure due to community-acquired pneumonia Patient has responded well to 7 days of Unasyn and doxycycline for likely aspiration pneumonia Continues on Augmentin day #8 Given his age, debility, poor respiratory reserve, and malnutrition, patient is at high risk for treatment failure and will extend his treatment course to 10-14 days  Bilateral pleural effusion Status post thoracentesis today with significant decrease in pain, tolerated procedure well Pleural fluid labs pending.  Ileus, resolved Patient noted to have abdominal distention and diarrhea with nausea around 8/1.  Imaging showed ileus versus partial obstruction.  Clinically it appeared to be an ileus, resolved with conservative measures, and now appears to tolerate PO without vomiting.  Risk for recurrence noted.  Alcoholic liver cirrhosis, chronic, stable Compensated  Mild dementia  Epilepsy No seizure activity Continue Dilantin, Keppra  Supratherapeutic INR Resolved  Paroxysmal A. Fib Heart rate normal Continue amiodarone, warfarin  Dysphagia Continue dysphagia 2 diet High risk for aspiration, multifactorial, most likely due to deconditioning from acute illness as well as baseline dementia and moderate protein calorie malnutrition  Severe protein calorie malnutrition  Disposition: Status is: Inpatient  Remains inpatient appropriate because:IV  treatments appropriate due to intensity of illness or inability to take PO   Dispo: The patient is from: Home              Anticipated d/c is to: SNF              Anticipated d/c date is: When SNF bed available              Patient currently is not medically stable to d/c.  Due to no safe disposition    Patient was admitted for aspiration pneumonia.  Illness complicated by malnutrition and ileus.  Now stable for transfer to a lower level of care, pending bed availability   MDM: The below labs and imaging reports were reviewed and summarized above.  Medication management as above.     DVT prophylaxis: N/a on warfarin  Code Status: FULL Family Communication:  Daughter by phone  Consultants:   Cardiology  Gen Surg   Subjective: Patient states back pain is much better since thorascentesis. Denies SOB, thinks breathing may be better.    Objective: Vitals:   10/29/19 2300 10/30/19 0829 10/30/19 0830 10/30/19 1544  BP:  123/67  101/66  Pulse: 72 85 85 91  Resp: 18 12 18 15   Temp:  98 F (36.7 C)  98 F (36.7 C)  TempSrc:      SpO2: 100% 98% 98% 100%  Weight:      Height:        Intake/Output Summary (Last 24 hours) at 10/30/2019 1847 Last data filed at 10/30/2019 1121 Gross per 24 hour  Intake 210 ml  Output 600 ml  Net -390 ml   Filed Weights   10/21/19 1603  Weight: 55 kg    Physical Exam: Blood pressure 101/66, pulse 91, temperature 98 F (36.7 C), resp.  rate 15, height 6' (1.829 m), weight 55 kg, SpO2 100 %. Gen: Thin elderly male sitting up in bed  no acute distress     Eyes: sclera anicteric, conjuctiva mildly injected bilaterally CVS: S1-S2, regulary, no gallops Respiratory:  decreased air entry likely secondary to decreased inspiratory effort GI: NABS, soft, NT  LE: No edema. No cyanosis Neuro: grossly nonfocal.    Data Reviewed: I have personally reviewed following labs and imaging studies:  CBC: Recent Labs  Lab 10/26/19 0315  10/27/19 0132 10/28/19 0144 10/29/19 0224 10/30/19 0158  WBC 9.8 8.1 8.6 7.7 7.4  HGB 7.6* 7.6* 7.5* 7.8* 7.8*  HCT 24.4* 23.5* 23.9* 24.7* 25.3*  MCV 87.8 87.4 89.2 88.2 87.5  PLT 319 350 325 373 563   Basic Metabolic Panel: Recent Labs  Lab 10/24/19 0212 10/25/19 0107 10/26/19 0315 10/29/19 0224 10/30/19 0158  NA 140 139 140 140 139  K 3.2* 3.2* 3.3* 3.3* 3.5  CL 106 104 104 102 100  CO2 23 27 27 30 30   GLUCOSE 103* 119* 82 111* 127*  BUN 11 5* 6* 6* 7*  CREATININE 0.76 0.65 0.59* 0.61 0.63  CALCIUM 7.7* 7.5* 7.9* 8.2* 8.5*  MG 1.6*  --   --  1.6*  --    GFR: Estimated Creatinine Clearance: 57.3 mL/min (by C-G formula based on SCr of 0.63 mg/dL). Liver Function Tests: Recent Labs  Lab 10/24/19 0212 10/25/19 0107 10/26/19 0315 10/30/19 0158  AST 28 24 23 22   ALT 21 21 19 17   ALKPHOS 58 52 56 66  BILITOT 1.4* 0.9 0.8 0.7  PROT 5.8* 5.6* 5.6* 5.8*  ALBUMIN 2.7* 2.5* 2.6* 2.7*   No results for input(s): LIPASE, AMYLASE in the last 168 hours. No results for input(s): AMMONIA in the last 168 hours. Coagulation Profile: Recent Labs  Lab 10/26/19 0315 10/27/19 0132 10/28/19 0144 10/29/19 0224 10/30/19 0158  INR 3.5* 3.2* 3.8* 2.8* 2.1*   Cardiac Enzymes: No results for input(s): CKTOTAL, CKMB, CKMBINDEX, TROPONINI in the last 168 hours. BNP (last 3 results) No results for input(s): PROBNP in the last 8760 hours. HbA1C: No results for input(s): HGBA1C in the last 72 hours. CBG: Recent Labs  Lab 10/29/19 1138 10/29/19 1656 10/29/19 2033 10/29/19 2352 10/30/19 0347  GLUCAP 132* 111* 159* 149* 111*   Lipid Profile: No results for input(s): CHOL, HDL, LDLCALC, TRIG, CHOLHDL, LDLDIRECT in the last 72 hours. Thyroid Function Tests: No results for input(s): TSH, T4TOTAL, FREET4, T3FREE, THYROIDAB in the last 72 hours. Anemia Panel: No results for input(s): VITAMINB12, FOLATE, FERRITIN, TIBC, IRON, RETICCTPCT in the last 72 hours. Urine analysis:     Component Value Date/Time   COLORURINE YELLOW 10/21/2019 2050   APPEARANCEUR HAZY (A) 10/21/2019 2050   LABSPEC 1.028 10/21/2019 2050   PHURINE 5.0 10/21/2019 2050   GLUCOSEU >=500 (A) 10/21/2019 2050   HGBUR SMALL (A) 10/21/2019 2050   BILIRUBINUR NEGATIVE 10/21/2019 2050   [place] 10/21/2019 2050   PROTEINUR 100 (A) 10/21/2019 2050   UROBILINOGEN 0.2 06/02/2011 0300   NITRITE NEGATIVE 10/21/2019 2050   LEUKOCYTESUR NEGATIVE 10/21/2019 2050   Sepsis Labs: @LABRCNTIP (procalcitonin:4,lacticacidven:4)  ) Recent Results (from the past 240 hour(s))  Blood culture (routine x 2)     Status: None   Collection Time: 10/21/19  5:43 PM   Specimen: BLOOD  Result Value Ref Range Status   Specimen Description BLOOD SITE NOT SPECIFIED  Final   Special Requests   Final    BOTTLES DRAWN  AEROBIC AND ANAEROBIC Blood Culture adequate volume   Culture   Final    NO GROWTH 5 DAYS Performed at [facility], [place] [address], [place], [place] [address]    Report Status 10/26/2019 FINAL  Final  Blood culture (routine x 2)     Status: None   Collection Time: 10/21/19  6:11 PM   Specimen: BLOOD LEFT FOREARM  Result Value Ref Range Status   Specimen Description BLOOD LEFT FOREARM  Final   Special Requests   Final    BOTTLES DRAWN AEROBIC AND ANAEROBIC Blood Culture results may not be optimal due to an inadequate volume of blood received in culture bottles   Culture   Final    NO GROWTH 5 DAYS Performed at [facility], [place] [address] [place], [place] [address]    Report Status 10/26/2019 FINAL  Final  Urine culture     Status: Abnormal   Collection Time: 10/21/19  8:50 PM   Specimen: Urine, Clean Catch  Result Value Ref Range Status   Specimen Description URINE, CLEAN CATCH  Final   Special Requests NONE  Final   Culture (A)  Final    <10,000 COLONIES/mL INSIGNIFICANT GROWTH Performed at [facility], [place] [address], [place], [place] [address]    Report Status  10/22/2019 FINAL  Final  Respiratory Panel by PCR     Status: None   Collection Time: 10/21/19  9:27 PM   Specimen: Nasopharyngeal Swab; Respiratory  Result Value Ref Range Status   Adenovirus NOT DETECTED NOT DETECTED Final   Coronavirus 229E NOT DETECTED NOT DETECTED Final    Comment: (NOTE) The Coronavirus on the Respiratory Panel, DOES NOT test for the novel  Coronavirus (2019 nCoV)    Coronavirus HKU1 NOT DETECTED NOT DETECTED Final   Coronavirus NL63 NOT DETECTED NOT DETECTED Final   Coronavirus OC43 NOT DETECTED NOT DETECTED Final   Metapneumovirus NOT DETECTED NOT DETECTED Final   Rhinovirus / Enterovirus NOT DETECTED NOT DETECTED Final   Influenza A NOT DETECTED NOT DETECTED Final   Influenza B NOT DETECTED NOT DETECTED Final   Parainfluenza Virus 1 NOT DETECTED NOT DETECTED Final   Parainfluenza Virus 2 NOT DETECTED NOT DETECTED Final   Parainfluenza Virus 3 NOT DETECTED NOT DETECTED Final   Parainfluenza Virus 4 NOT DETECTED NOT DETECTED Final   Respiratory Syncytial Virus NOT DETECTED NOT DETECTED Final   Bordetella pertussis NOT DETECTED NOT DETECTED Final   Chlamydophila pneumoniae NOT DETECTED NOT DETECTED Final   Mycoplasma pneumoniae NOT DETECTED NOT DETECTED Final    Comment: Performed at [facility], [place]. [address], [place], [place] [address]  Culture, sputum-assessment     Status: None   Collection Time: 10/21/19 11:44 PM   Specimen: Sputum  Result Value Ref Range Status   Specimen Description SPUTUM  Final   Special Requests COLLECTED IN THE ER  Final   Sputum evaluation   Final    THIS SPECIMEN IS ACCEPTABLE FOR SPUTUM CULTURE Performed at [facility], [address], [place], [place] [address]    Report Status 10/22/2019 FINAL  Final  Culture, respiratory     Status: None   Collection Time: 10/21/19 11:44 PM   Specimen: SPU  Result Value Ref Range Status   Specimen Description SPUTUM  Final   Special Requests COLLECTED IN THE ER Reflexed  from [id number]  Final   Gram Stain   Final    RARE WBC  PRESENT,BOTH PMN AND MONONUCLEAR FEW GRAM POSITIVE COCCI IN PAIRS FEW GRAM VARIABLE ROD RARE BUDDING YEAST SEEN    Culture   Final    RARE Consistent with normal respiratory flora. Performed at [facility], [place] [address], [place], [place] [address]    Report Status 10/24/2019 FINAL  Final  MRSA PCR Screening     Status: None   Collection Time: 10/22/19 12:43 AM   Specimen: Nasal Mucosa; Nasopharyngeal  Result Value Ref Range Status   MRSA by PCR NEGATIVE NEGATIVE Final    Comment:        The GeneXpert MRSA Assay (FDA approved for NASAL specimens only), is one component of a comprehensive MRSA colonization surveillance program. It is not intended to diagnose MRSA infection nor to guide or monitor treatment for MRSA infections. Performed at [facility], [place] [address], [place], [place] [address]          Radiology Studies: DG Chest 1 View  Result Date: 10/30/2019 CLINICAL DATA:  Right-sided thoracentesis EXAM: CHEST  1 VIEW COMPARISON:  10/29/2019 FINDINGS: Stable cardiomegaly. Hyperinflated lungs. Near resolution of right-sided pleural effusion with improved aeration of the right lung base. Left-sided pleural effusion persists with associated hazy left basilar opacity. No pneumothorax. IMPRESSION: 1. Near resolution of right-sided pleural effusion with improved aeration of the right lung base. No pneumothorax. 2. Persistent left-sided pleural effusion and associated hazy left basilar opacity. Electronically Signed   By: Davina Poke D.O.   On: 10/30/2019 11:24   DG Chest 2 View  Result Date: 10/29/2019 CLINICAL DATA:  Pt c/o right shoulder pain and weakness x 1 day. No injury to the shoulder. Axillary view was unobtainable due to patient condition. Hx of DM, HTN, AND stroke. Pt is a nonsmoker. EXAM: CHEST - 2 VIEW COMPARISON:  Radiograph 10/21/2019 FINDINGS: Stable enlarged cardiac silhouette. There is  increased bilateral pleural effusions. Lungs are hyperinflated. No focal consolidation. No pneumothorax. IMPRESSION: New bilateral pleural effusions. Hyperinflated lungs. Electronically Signed   By: Suzy Bouchard [name]   On: 10/29/2019 16:55   DG Shoulder Right  Result Date: 10/29/2019 CLINICAL DATA:  Right shoulder pain. No known injury. EXAM: RIGHT SHOULDER - 2+ VIEW COMPARISON:  None. FINDINGS: Superior subluxation of the humeral head abutting the undersurface of the acromion. Mild glenohumeral spurring. Mild subacromial spurring. Acromioclavicular degenerative change with inferiorly directed osteophytes. No evidence of fracture, erosion, or focal bone lesion. Diffuse bony under mineralization. No focal soft tissue abnormality. IMPRESSION: 1. Superior subluxation of the humeral head abutting the undersurface of the acromion, consistent with rotator cuff arthropathy. 2. Mild glenohumeral and acromioclavicular degenerative change. Electronically Signed   By: Keith Rake [name]   On: 10/29/2019 17:06   IR THORACENTESIS ASP PLEURAL SPACE W/IMG GUIDE  Result Date: 10/30/2019 INDICATION: Patient with pneumonia and bilateral pleural effusions. Interventional radiology asked to perform a therapeutic thoracentesis. EXAM: ULTRASOUND GUIDED THORACENTESIS MEDICATIONS: 1% lidocaine 10 mL COMPLICATIONS: None immediate. PROCEDURE: An ultrasound guided thoracentesis was thoroughly discussed with the patient and questions answered. The benefits, risks, alternatives and complications were also discussed. The patient understands and wishes to proceed with the procedure. Written consent was obtained. Ultrasound was performed to localize and mark an adequate pocket of fluid in the right chest. The area was then prepped and draped in the normal sterile fashion. 1% Lidocaine was used for local anesthesia. Under ultrasound guidance a 6 Fr Safe-T-Centesis catheter was introduced. Thoracentesis was performed. The catheter was  removed and a  dressing applied. FINDINGS: A total of approximately 400 mL of clear yellow fluid was removed. IMPRESSION: Successful ultrasound guided right thoracentesis yielding 400 mL of pleural fluid. Read by: Soyla Dryer, NP Electronically Signed   By: Lucrezia Europe [name]   On: 10/30/2019 11:35        Scheduled Meds: . amiodarone  200 mg Oral Daily  . amoxicillin-clavulanate  1 tablet Oral Q12H  . bisacodyl  10 mg Rectal Once  . docusate sodium  100 mg Oral BID  . ferrous sulfate  325 mg Oral BID WC  . folic acid  1 mg Oral Daily  . levETIRAcetam  1,500 mg Oral BID  . lidocaine      . melatonin  3 mg Oral QHS  . multivitamin with minerals  1 tablet Oral Daily  . pantoprazole (PROTONIX) IV  40 mg Intravenous Q12H  . phenytoin  100 mg Oral BID  . polyethylene glycol  17 g Oral Daily  . sodium chloride flush  3 mL Intravenous Q12H  . sodium phosphate  1 enema Rectal Once  . vitamin B-12  100 mcg Oral Daily  . Warfarin - Pharmacist Dosing Inpatient   Does not apply q1600   Continuous Infusions:    LOS: 8 days    Time spent: 25 minutes    Vashti Hey, MD [facility] 10/30/2019, 6:47 PM     Please page though [place] or Epic secure chat:  For [employer], Adult nurse

## 2019-10-30 NOTE — Progress Notes (Signed)
ANTICOAGULATION CONSULT NOTE  Pharmacy Consult:  Warfarin Indication: atrial fibrillation  No Known Allergies  Patient Measurements: Height: 6' (182.9 cm) Weight: 55 kg (121 lb 4.1 oz) IBW/kg (Calculated) : 77.6  Vital Signs: Temp: 98 F (36.7 C) (08/10 0829) BP: 123/67 (08/10 0829) Pulse Rate: 85 (08/10 0830)  Labs: Recent Labs    10/28/19 0144 10/28/19 0144 10/29/19 0224 10/30/19 0158  HGB 7.5*   < > 7.8* 7.8*  HCT 23.9*  --  24.7* 25.3*  PLT 325  --  373 364  LABPROT 36.6*  --  28.2* 22.7*  INR 3.8*  --  2.8* 2.1*  CREATININE  --   --  0.61 0.63   < > = values in this interval not displayed.    Estimated Creatinine Clearance: 57.3 mL/min (by C-G formula based on SCr of 0.63 mg/dL).  Assessment: 79 year old male on warfarin PTA for Afib. Warfarin was initially held for partial SBO. Pharmacy consulted to restart warfarin 8/4 after patient passed swallow evaluation.  Outpatient INRs in July were either sub or supratherapeutic, likely due to acute illnesses including GIB in June and labile PO intake.   Warfarin dose PTA: 2.5mg  on Mon/Fri, 5mg  all other days  INR today 2.1 -  Within goal INR 2-3.     Goal of Therapy:  INR 2-3 Monitor platelets by anticoagulation protocol: Yes   Plan:  Warfarin 5 mg po x 1 tonight Monitor daily INR, CBC/plt Monitor for signs/symptoms of bleeding     Alanda Slim, PharmD, [name] Clinical Pharmacist Please see AMION for all Pharmacists' Contact Phone Numbers 10/30/2019, 10:15 AM

## 2019-10-30 NOTE — Progress Notes (Signed)
Nutrition Follow Up  DOCUMENTATION CODES:   Underweight, Severe malnutrition in context of chronic illness  INTERVENTION:    Magic cup TID with meals, each supplement provides 290 kcal and 9 grams of protein  Hormel Shake BID between meals, each supplement provides 520 kcals and 22 grams of protein  NUTRITION DIAGNOSIS:   Severe Malnutrition related to chronic illness (prior CVA/pancreatitis) as evidenced by severe fat depletion, severe muscle depletion, energy intake < or equal to 75% for > or equal to 1 month, percent weight loss.  Ongoing  GOAL:   Patient will meet greater than or equal to 90% of their needs   Progressing   MONITOR:   PO intake, Supplement acceptance, Diet advancement, Weight trends, Labs, I & O's  REASON FOR ASSESSMENT:   Consult Assessment of nutrition requirement/status  ASSESSMENT:   Patient with PMH significant for seizure disorder, prior CVA, HTN, DM, and pancreatitis. Recently admitted in June for GI bleed. Presents this admission with supraumbilical hernia resulting in SBO.   Diet advanced to DYS2 with nectar liquids on 8/5. Per SLP evaluation today, pt's dysphagia likely to result in chronic aspiration. GOC to be considered. Pt states appetite is progressing slowly. Didn't have breakfast this am but eating lunch. Does not like Ensure. RD to trial other supplements.   Depending on [place], pt would benefit from PEG placement given severity of malnutrition.   Admission weight: 55 kg  No other weights obtained   Medications: colace, ferrous sulfate, folic acid, MVI, miralax, Vit B12 Labs: CBG [id number]  Diet Order:   Diet Order            DIET DYS 2 Room service appropriate? Yes; Fluid consistency: Nectar Thick  Diet effective now                 EDUCATION NEEDS:   Not appropriate for education at this time  Skin:  Skin Assessment: Reviewed RN Assessment  Last BM:  8/9  Height:   Ht Readings from Last 1 Encounters:  10/21/19 6'  (1.829 m)    Weight:   Wt Readings from Last 1 Encounters:  10/21/19 55 kg    BMI:  Body mass index is 16.44 kg/m.  Estimated Nutritional Needs:   Kcal:  1650-1850 kcal  Protein:  80-100 grams  Fluid:  >/= 1.6 L/day   Mariana Single RD, LDN Clinical Nutrition Pager listed in [place]

## 2019-10-30 NOTE — Progress Notes (Signed)
  Speech Language Pathology Treatment:    Patient Details Name: [name] MRN: [id number] DOB: Oct 18, 1940 Today's Date: 10/30/2019 Time: 5449-2010 SLP Time Calculation (min) (ACUTE ONLY): 10 min  Assessment / Plan / Recommendation Clinical Impression  SLP visit indicated today due to pt's chart noting he had difficulty swallowing his medications with applesauce c/b frequent coughing - documented last evening at after 2300.  Pt denies this occurrence showing decreased memory and awareness to his dysphagia.     Pt's RN reports he tolerated he po medications without any coughing.  Pt declined to consume any of his breakfast with this SLP today initially stating he could not eat, later he stated he "didn't feel like having anything".   Pt with decreased awarness to his dysphagia and is known to be chronic aspiration risk due to his poor compensation for his cervical osteophytes.  SLP recommends to consider GOC due to pt's dysphagia, likely chronicity of aspiration and recurrent asp pna risk.  Pt's voice was gurgly today indicative of possible secretions retained on vocal cords, despte cues to cough and expectorate, he did not clear.    Recommend follow up at SNF for dysphagia management but no acute follow up indicated as he is on a modified diet to mitigate aspiration risk and has been educated to findings/recommendations.    All education has been completed to help mitigate pt's aspiration and dysphagia.  No follow up needed.    HPI HPI: Pt is an 79 yo male admitted secondary to generalized weakness and fever. Pt also with A fib bowel obstruction vs. ileus. PMH includes HTN, hiatal hernia, eizures, dementia, a fib, and CVA. CT abdomen also revealed airspace consolidation in both lung bases may represent pneumonia or aspiration. BSE 07/2019 reg/thin recommended.       SLP Plan  All goals met       Recommendations  Diet recommendations: Dysphagia 2 (fine chop);Nectar-thick  liquid Liquids provided via: Cup Medication Administration: Crushed with puree (or thick liquids) Supervision: Patient able to self feed Compensations: Minimize environmental distractions;Clear throat intermittently;Monitor for anterior loss;Small sips/bites;Slow rate;Other (Comment) Postural Changes and/or Swallow Maneuvers: Seated upright 90 degrees;Upright 30-60 min after meal                Oral Care Recommendations: Oral care BID Follow up Recommendations: Skilled Nursing facility SLP Visit Diagnosis: Dysphagia, pharyngeal phase (R13.13) Plan: All goals met       GO                [name] 10/30/2019, 10:25 AM  Kathleen Lime, MS [place] Office [phone]

## 2019-10-30 NOTE — Plan of Care (Signed)

## 2019-10-30 NOTE — TOC Progression Note (Addendum)
Transition of Care [facility]) - Progression Note    Patient Details  Name: [name] MRN: [id number] Date of Birth: [birth year]  [facility] CM/SW Contact  Angelita Ingles, RN Phone Number: [phone]  10/30/2019, 12:32 PM  Clinical Narrative:    CM received call from daughter inquiring if there have been any bed offers. Daughter has been made aware of bed offers but would like to know specifically about [facility]. CM called Whitney with [facility] who stated that the facility needs more info meaning  more therapy notes in order to determine if they are able to offer patient a bed. Noted have been sent via [place]. CM will follow up.   [place] with Whitney at [facility] who states that she is waiting to hear back from her team to determine if the patient can be admitted. Daughter [name] has been updated and agreeable with [facility] as alternative for 2nd choice of placement.   [facility] can not offer bed. Notified Olivia Mackie at [facility] Awaiting confirmation of bed offer. Daughter has been updated.     Expected Discharge Plan: Skilled Nursing Facility Barriers to Discharge: Continued Medical Work up  Expected Discharge Plan and Services Expected Discharge Plan: [place] In-house Referral: NA Discharge Planning Services: CM Consult Post Acute Care Choice: NA Living arrangements for the past 2 months: Single Family Home                 DME Arranged: N/A DME Agency: NA       HH Arranged: NA HH Agency: NA         Social Determinants of Health (SDOH) Interventions    Readmission Risk Interventions Readmission Risk Prevention Plan 09/12/2019 07/25/2018  Transportation Screening Complete Complete  PCP or Specialist Appt within 5-7 Days - Complete  PCP or Specialist Appt within 3-5 Days Complete -  Home Care Screening - Complete  Medication Review (RN CM) - Complete  HRI or Home Care Consult Complete -  Social Work Consult for [place] Planning/Counseling Complete -  Palliative Care Screening Not Applicable -  Medication Review Press photographer) Complete -  Some recent data might be hidden

## 2019-10-31 ENCOUNTER — Telehealth: Payer: Self-pay

## 2019-10-31 DIAGNOSIS — J9601 Acute respiratory failure with hypoxia: Secondary | ICD-10-CM | POA: Diagnosis not present

## 2019-10-31 DIAGNOSIS — E43 Unspecified severe protein-calorie malnutrition: Secondary | ICD-10-CM

## 2019-10-31 DIAGNOSIS — A419 Sepsis, unspecified organism: Secondary | ICD-10-CM | POA: Diagnosis not present

## 2019-10-31 DIAGNOSIS — R278 Other lack of coordination: Secondary | ICD-10-CM | POA: Diagnosis not present

## 2019-10-31 DIAGNOSIS — R531 Weakness: Secondary | ICD-10-CM | POA: Diagnosis not present

## 2019-10-31 DIAGNOSIS — M6259 Muscle wasting and atrophy, not elsewhere classified, multiple sites: Secondary | ICD-10-CM | POA: Diagnosis not present

## 2019-10-31 DIAGNOSIS — I69391 Dysphagia following cerebral infarction: Secondary | ICD-10-CM | POA: Diagnosis not present

## 2019-10-31 DIAGNOSIS — I10 Essential (primary) hypertension: Secondary | ICD-10-CM | POA: Diagnosis not present

## 2019-10-31 DIAGNOSIS — A403 Sepsis due to Streptococcus pneumoniae: Secondary | ICD-10-CM | POA: Diagnosis not present

## 2019-10-31 DIAGNOSIS — I4892 Unspecified atrial flutter: Secondary | ICD-10-CM | POA: Diagnosis not present

## 2019-10-31 DIAGNOSIS — J449 Chronic obstructive pulmonary disease, unspecified: Secondary | ICD-10-CM | POA: Diagnosis not present

## 2019-10-31 DIAGNOSIS — J90 Pleural effusion, not elsewhere classified: Secondary | ICD-10-CM

## 2019-10-31 DIAGNOSIS — R5381 Other malaise: Secondary | ICD-10-CM | POA: Diagnosis not present

## 2019-10-31 DIAGNOSIS — J189 Pneumonia, unspecified organism: Secondary | ICD-10-CM | POA: Diagnosis not present

## 2019-10-31 DIAGNOSIS — F0150 Vascular dementia without behavioral disturbance: Secondary | ICD-10-CM | POA: Diagnosis not present

## 2019-10-31 DIAGNOSIS — Z7901 Long term (current) use of anticoagulants: Secondary | ICD-10-CM | POA: Diagnosis not present

## 2019-10-31 DIAGNOSIS — Z20828 Contact with and (suspected) exposure to other viral communicable diseases: Secondary | ICD-10-CM | POA: Diagnosis not present

## 2019-10-31 DIAGNOSIS — R569 Unspecified convulsions: Secondary | ICD-10-CM | POA: Diagnosis not present

## 2019-10-31 DIAGNOSIS — G309 Alzheimer's disease, unspecified: Secondary | ICD-10-CM | POA: Diagnosis not present

## 2019-10-31 DIAGNOSIS — R1312 Dysphagia, oropharyngeal phase: Secondary | ICD-10-CM | POA: Diagnosis not present

## 2019-10-31 DIAGNOSIS — R4182 Altered mental status, unspecified: Secondary | ICD-10-CM | POA: Diagnosis not present

## 2019-10-31 DIAGNOSIS — Z7401 Bed confinement status: Secondary | ICD-10-CM | POA: Diagnosis not present

## 2019-10-31 DIAGNOSIS — R41841 Cognitive communication deficit: Secondary | ICD-10-CM | POA: Diagnosis not present

## 2019-10-31 DIAGNOSIS — J9602 Acute respiratory failure with hypercapnia: Secondary | ICD-10-CM

## 2019-10-31 DIAGNOSIS — R778 Other specified abnormalities of plasma proteins: Secondary | ICD-10-CM | POA: Diagnosis not present

## 2019-10-31 DIAGNOSIS — I4891 Unspecified atrial fibrillation: Secondary | ICD-10-CM | POA: Diagnosis not present

## 2019-10-31 DIAGNOSIS — K7030 Alcoholic cirrhosis of liver without ascites: Secondary | ICD-10-CM | POA: Diagnosis not present

## 2019-10-31 DIAGNOSIS — G40909 Epilepsy, unspecified, not intractable, without status epilepticus: Secondary | ICD-10-CM | POA: Diagnosis not present

## 2019-10-31 DIAGNOSIS — M6281 Muscle weakness (generalized): Secondary | ICD-10-CM | POA: Diagnosis not present

## 2019-10-31 DIAGNOSIS — M2550 Pain in unspecified joint: Secondary | ICD-10-CM | POA: Diagnosis not present

## 2019-10-31 DIAGNOSIS — Z03818 Encounter for observation for suspected exposure to other biological agents ruled out: Secondary | ICD-10-CM | POA: Diagnosis not present

## 2019-10-31 DIAGNOSIS — I639 Cerebral infarction, unspecified: Secondary | ICD-10-CM | POA: Diagnosis not present

## 2019-10-31 LAB — BASIC METABOLIC PANEL
Anion gap: 8 (ref 5–15)
BUN: 7 mg/dL — ABNORMAL LOW (ref 8–23)
CO2: 29 mmol/L (ref 22–32)
Calcium: 8.1 mg/dL — ABNORMAL LOW (ref 8.9–10.3)
Chloride: 100 mmol/L (ref 98–111)
Creatinine, Ser: 0.6 mg/dL — ABNORMAL LOW (ref 0.61–1.24)
GFR calc Af Amer: >60 mL/min (ref >60)
GFR calc non Af Amer: >60 mL/min (ref >60)
Glucose, Bld: 109 mg/dL — ABNORMAL HIGH (ref 70–99)
Potassium: 3.5 mmol/L (ref 3.5–5.1)
Sodium: 137 mmol/L (ref 135–145)

## 2019-10-31 LAB — CBC WITH DIFFERENTIAL/PLATELET
Abs Immature Granulocytes: 0.18 10*3/uL — ABNORMAL HIGH (ref 0.00–0.07)
Basophils Absolute: 0.1 10*3/uL (ref 0.0–0.1)
Basophils Relative: 1 %
Eosinophils Absolute: 0.1 10*3/uL (ref 0.0–0.5)
Eosinophils Relative: 1 %
HCT: 23.4 % — ABNORMAL LOW (ref 39.0–52.0)
Hemoglobin: 7.5 g/dL — ABNORMAL LOW (ref 13.0–17.0)
Immature Granulocytes: 3 %
Lymphocytes Relative: 14 %
Lymphs Abs: 1 10*3/uL (ref 0.7–4.0)
MCH: 28.2 pg (ref 26.0–34.0)
MCHC: 32.1 g/dL (ref 30.0–36.0)
MCV: 88 fL (ref 80.0–100.0)
Monocytes Absolute: 1.7 10*3/uL — ABNORMAL HIGH (ref 0.1–1.0)
Monocytes Relative: 24 %
Neutro Abs: 4 10*3/uL (ref 1.7–7.7)
Neutrophils Relative %: 57 %
Platelets: 331 10*3/uL (ref 150–400)
RBC: 2.66 MIL/uL — ABNORMAL LOW (ref 4.22–5.81)
RDW: 20.9 % — ABNORMAL HIGH (ref 11.5–15.5)
WBC: 6.9 10*3/uL (ref 4.0–10.5)
nRBC: 0.6 % — ABNORMAL HIGH (ref 0.0–0.2)

## 2019-10-31 LAB — PHENYTOIN LEVEL, TOTAL: Phenytoin Lvl: 11.8 ug/mL (ref 10.0–20.0)

## 2019-10-31 LAB — PROTIME-INR
INR: 2.1 — ABNORMAL HIGH (ref 0.8–1.2)
Prothrombin Time: 22.5 seconds — ABNORMAL HIGH (ref 11.4–15.2)

## 2019-10-31 MED ORDER — PROBIOTIC ACIDOPHILUS PO CAPS
1.0000 | ORAL_CAPSULE | Freq: Two times a day (BID) | ORAL | 0 refills | Status: AC
Start: 2019-10-31 — End: ?

## 2019-10-31 MED ORDER — AMOXICILLIN-POT CLAVULANATE 875-125 MG PO TABS: 1.0000 | ORAL_TABLET | Freq: Two times a day (BID) | ORAL | 0 refills | Status: AC

## 2019-10-31 MED ORDER — WARFARIN SODIUM 2.5 MG PO TABS: ORAL_TABLET | ORAL | Status: AC

## 2019-10-31 MED ORDER — WARFARIN SODIUM 5 MG PO TABS
5.0000 mg | ORAL_TABLET | Freq: Once | ORAL | Status: AC
  Administered 2019-10-31: 5 mg via ORAL
  Filled 2019-10-31: qty 1

## 2019-10-31 MED ORDER — ACETAMINOPHEN 325 MG PO TABS: 650.0000 mg | ORAL_TABLET | Freq: Four times a day (QID) | ORAL | Status: AC | PRN

## 2019-10-31 NOTE — Progress Notes (Signed)
Report called to [facility]. Pending PTAR pickup.

## 2019-10-31 NOTE — Discharge Summary (Addendum)
Physician Discharge Summary  [name] Sandra RFF:[id number] DOB: 1940/07/12 DOA: 10/21/2019  PCP: [name], MD  Admit date: 10/21/2019 Discharge date: 10/31/2019  Admitted From: Home Disposition: SNF  Recommendations for Outpatient Follow-up:  1. Follow ups as below. 2. Check INR in 3 to 4 days 3. Please obtain CBC/BMP/Mag in 1 week 4. Recommend repeat chest x-ray in 3 to 4 weeks 5. Please follow up on the following pending results: None   Discharge Condition: Stable CODE STATUS: Full code    Hospital Course: 79 y.o. M with atrial fibrillation on warfarin, history of seizures, dementia,, cirrhosis compensated, CVA, and HTN was admitted with CAP which has been complicated by ileus.   Patient was scheduled for discharge however developed severe back pain.  Work-up revealed new bilateral pleural effusions.  He underwent right-sided thoracocentesis on 8/10 with removal of 400 cc fluid.  Fluid study was not sent. He will be discharged on Augmentin for 4 more days to complete a total of 12 days.  On the day of discharge, patient had no respiratory complaints.  Breathing well on room air.   See individual problem list below for more hospital course.   Discharge Diagnoses:  Severe sepsis with acute hypoxic respiratory failure due to community-acquired pneumonia concerning for gram-negative pneumonia in the setting of aspiration risk: respiratory failure resolved.  Liberated of oxygen. -Discharged on Augmentin to complete a total of 12 days -Probiotics -Aspiration precautions.  Bilateral pleural effusion-s/p thoracocentesis on the right side with removal of 400 cc fluid.  Fluid study was not ordered -Continue antibiotics as above  Ileus, resolved  Alcoholic liver cirrhosis, chronic, stable  Mild dementia -Reorientation's and delirium precautions   Normocytic anemia: H&H stable. -Recheck CBC at follow-up  Epilepsy -Continue Dilantin,  Keppra  Supratherapeutic INR: Resolved.  INR therapeutic. -Check INR in 3 to 4 days  Paroxysmal A. Fib: Rate controlled. -Continue amiodarone and warfarin  Dysphagia -Continue dysphagia 2 and aspiration precautions.  Debility/physical deconditioning -Continue PT/OT at SNF.  Severe protein calorie malnutrition -Continue multivitamin and feeding supplements. Body mass index is 16.44 kg/m. Nutrition Problem: Severe Malnutrition Etiology: chronic illness (prior CVA/pancreatitis) Signs/Symptoms: severe fat depletion, severe muscle depletion, energy intake < or equal to 75% for > or equal to 1 month, percent weight loss Interventions: Ensure Enlive (each supplement provides 350kcal and 20 grams of protein), MVI      Discharge Exam: Vitals:   10/30/19 2149 10/31/19 0810  BP: 122/64 125/63  Pulse: 95 85  Resp: 20   Temp: 98.4 F (36.9 C) 98 F (36.7 C)  SpO2: 98% 100%    GENERAL: No apparent distress.  Nontoxic. HEENT: MMM.  Vision and hearing grossly intact.  NECK: Supple.  No apparent JVD.  RESP:  No IWOB.  Fair aeration bilaterally.  Upper airway sounds.   CVS:  RRR. Heart sounds normal.  ABD/GI/GU: Bowel sounds present. Soft. Non tender.  MSK/EXT:  Moves extremities.  Significant muscle mass and subcu fat loss. SKIN: no apparent skin lesion or wound NEURO: Awake, alert and oriented x4.  No apparent focal neuro deficit. PSYCH: Calm. Normal affect.  Discharge Instructions  Discharge Instructions    Diet general   Complete by: As directed    Dysphagia-2 diet   Increase activity slowly   Complete by: As directed      Allergies as of 10/31/2019   No Known Allergies     Medication List    TAKE these medications   acetaminophen 325 MG tablet Commonly  known as: TYLENOL Take 2 tablets (650 mg total) by mouth every 6 (six) hours as needed for mild pain (or Fever >/= 101).   amiodarone 200 MG tablet Commonly known as: PACERONE Take 1 tablet (200 mg total)  by mouth daily. What changed: when to take this   amoxicillin-clavulanate 875-125 MG tablet Commonly known as: AUGMENTIN Take 1 tablet by mouth every 12 (twelve) hours.   feeding supplement (ENSURE ENLIVE) Liqd Take 237 mLs by mouth 3 (three) times daily between meals. What changed:   when to take this  reasons to take this   ferrous sulfate 325 (65 FE) MG tablet Take 1 tablet (325 mg total) by mouth 3 (three) times daily with meals. What changed: when to take this   folic acid 1 MG tablet Commonly known as: FOLVITE Take 1 tablet (1 mg total) by mouth daily.   levETIRAcetam 500 MG tablet Commonly known as: KEPPRA TAKE 3 TABLETS BY MOUTH TWICE A DAY What changed: when to take this   multivitamin with minerals Tabs tablet Take 1 tablet by mouth daily.   pantoprazole 40 MG tablet Commonly known as: PROTONIX Take 1 tablet (40 mg total) by mouth daily at 6 (six) AM.   phenytoin 100 MG ER capsule Commonly known as: DILANTIN Take 2 capsules (200 mg total) by mouth at bedtime.   polyethylene glycol 17 g packet Commonly known as: MIRALAX / GLYCOLAX Take 17 g by mouth daily as needed for mild constipation. What changed: reasons to take this   Probiotic Acidophilus Caps Take 1 tablet by mouth in the morning and at bedtime.   warfarin 2.5 MG tablet Commonly known as: COUMADIN Take as directed. If you are unsure how to take this medication, talk to your nurse or doctor. Original instructions: Take 5 mg by mouth at 6 PM in the evening on Sun/Tues/Wed/Thurs/Sat and 2.5 mg on Mon/Fri What changed: See the new instructions.       Consultations:  IR  Procedures/Studies:  8/10-ultrasound-guided thoracocentesis with removal of 400 cc pleural fluid.   DG Chest 1 View  Result Date: 10/30/2019 CLINICAL DATA:  Right-sided thoracentesis EXAM: CHEST  1 VIEW COMPARISON:  10/29/2019 FINDINGS: Stable cardiomegaly. Hyperinflated lungs. Near resolution of right-sided pleural  effusion with improved aeration of the right lung base. Left-sided pleural effusion persists with associated hazy left basilar opacity. No pneumothorax. IMPRESSION: 1. Near resolution of right-sided pleural effusion with improved aeration of the right lung base. No pneumothorax. 2. Persistent left-sided pleural effusion and associated hazy left basilar opacity. Electronically Signed   By: Davina Poke D.O.   On: 10/30/2019 11:24   DG Chest 2 View  Result Date: 10/29/2019 CLINICAL DATA:  Pt c/o right shoulder pain and weakness x 1 day. No injury to the shoulder. Axillary view was unobtainable due to patient condition. Hx of DM, HTN, AND stroke. Pt is a nonsmoker. EXAM: CHEST - 2 VIEW COMPARISON:  Radiograph 10/21/2019 FINDINGS: Stable enlarged cardiac silhouette. There is increased bilateral pleural effusions. Lungs are hyperinflated. No focal consolidation. No pneumothorax. IMPRESSION: New bilateral pleural effusions. Hyperinflated lungs. Electronically Signed   By: Suzy Bouchard [name]   On: 10/29/2019 16:55   DG Chest 2 View  Result Date: 10/21/2019 CLINICAL DATA:  Cough, chest pain, weakness EXAM: CHEST - 2 VIEW COMPARISON:  Chest x-rays dated 10/19/2019 and 09/09/2019. FINDINGS: Heart size and mediastinal contours are stable. Lungs are hyperexpanded. Retrocardiac opacity, pneumonia versus atelectasis. No pleural effusion or pneumothorax is seen. No acute appearing  osseous abnormality. IMPRESSION: 1. Retrocardiac opacity, pneumonia versus atelectasis. 2. Hyperexpanded lungs suggesting COPD. Electronically Signed   By: Franki Cabot [name]   On: 10/21/2019 16:41   DG Scapula Left  Result Date: 10/20/2019 CLINICAL DATA:  Fall and scapula pain EXAM: LEFT SCAPULA - 2+ VIEWS COMPARISON:  None. FINDINGS: There is no evidence of fracture or other focal bone lesions. Soft tissues are unremarkable. Glenohumeral joint and AC joint arthrosis is seen. IMPRESSION: Negative. Electronically Signed   By: Prudencio Pair [name]   On: 10/20/2019 00:15   DG Shoulder Right  Result Date: 10/29/2019 CLINICAL DATA:  Right shoulder pain. No known injury. EXAM: RIGHT SHOULDER - 2+ VIEW COMPARISON:  None. FINDINGS: Superior subluxation of the humeral head abutting the undersurface of the acromion. Mild glenohumeral spurring. Mild subacromial spurring. Acromioclavicular degenerative change with inferiorly directed osteophytes. No evidence of fracture, erosion, or focal bone lesion. Diffuse bony under mineralization. No focal soft tissue abnormality. IMPRESSION: 1. Superior subluxation of the humeral head abutting the undersurface of the acromion, consistent with rotator cuff arthropathy. 2. Mild glenohumeral and acromioclavicular degenerative change. Electronically Signed   By: Keith Rake [name]   On: 10/29/2019 17:06   DG Abd 1 View  Result Date: 10/21/2019 CLINICAL DATA:  Abdominal distension EXAM: ABDOMEN - 1 VIEW COMPARISON:  None. FINDINGS: Diffusely mildly dilated air-filled loops of bowel are seen throughout the abdomen. There is question of a double wall sign seen within the mid abdomen which could represent a small amount of pneumoperitoneum. Degenerative changes in the lumbar spine and bilateral hips. IMPRESSION: Mildly dilated air-filled loops of bowel throughout the abdomen with question of double wall sign which could represent pneumoperitoneum. If further evaluation is required would recommend lateral decubitus film or cross-sectional imaging. Electronically Signed   By: Prudencio Pair [name]   On: 10/21/2019 22:14   CT Head Wo Contrast  Result Date: 10/19/2019 CLINICAL DATA:  Syncope EXAM: CT HEAD WITHOUT CONTRAST TECHNIQUE: Contiguous axial images were obtained from the base of the skull through the vertex without intravenous contrast. COMPARISON:  CT brain 12/06/2016 FINDINGS: Brain: No acute territorial infarction, hemorrhage or new intracranial mass. Encephalomalacia in the left temporal lobe as before.  Moderate atrophy. Mild hypodensity in the white matter consistent with chronic small vessel ischemic change. Stable 1.2 cm partially calcified extra-axial mass along the right cranial vertex likely a meningioma. Stable ventricle size. Vascular: No hyperdense vessels.  Carotid vascular calcification. Skull: Normal. Negative for fracture or focal lesion. Sinuses/Orbits: Mild mucosal thickening in the ethmoid and maxillary sinuses. Old appearing deformity medial wall right orbit Other: None IMPRESSION: 1. No CT evidence for acute intracranial abnormality. 2. Atrophy and mild chronic small vessel ischemic changes of the white matter. Remote left temporal lobe infarct. 3. Stable 1.2 cm partially calcified extra-axial mass along the right cranial vertex, likely meningioma. Electronically Signed   By: Donavan Foil [name]   On: 10/19/2019 23:07   CT ABDOMEN PELVIS W CONTRAST  Result Date: 10/21/2019 CLINICAL DATA:  Free air seen on x-ray. Peritonitis or perforation suspected. History of pancreatitis and hiatal hernia. EXAM: CT ABDOMEN AND PELVIS WITH CONTRAST TECHNIQUE: Multidetector CT imaging of the abdomen and pelvis was performed using the standard protocol following bolus administration of intravenous contrast. CONTRAST:  138mL OMNIPAQUE IOHEXOL 300 MG/ML  SOLN COMPARISON:  Abdomen 10/21/2019.  CT abdomen and pelvis 07/21/2019 FINDINGS: Lower chest: Airspace consolidation in both lung bases. Small pericardial effusion. Cardiac enlargement. Hepatobiliary: Scattered calcifications throughout the liver,  likely granulomas. No focal lesions. Portal veins are patent. Gallbladder is decompressed. No bile duct dilatation. Pancreas: Atrophic.  No mass or ductal dilatation appreciated. Spleen: Multiple calcifications, likely granulomas. Adrenals/Urinary Tract: No adrenal gland nodules. Kidneys are symmetrical. No renal mass lesion or hydronephrosis. No hydroureter. Bladder is normal. Stomach/Bowel: Moderately dilated gas and  fluid-filled small bowel. Portions of the colon are also dilated. No wall thickening or mass appreciated. Terminal ileum is decompressed. Changes likely represent obstruction although ileus may also be present. No free air. Vascular/Lymphatic: Calcific aortic atherosclerosis. No aortic aneurysm. No significant lymphadenopathy. Reproductive: Prostate is unremarkable. Other: No abdominal wall hernia or abnormality. No abdominopelvic ascites. Musculoskeletal: Internal fixation of the left hip. Heterotopic bone formation. Degenerative changes in the spine and hips. Ankylosis of the thoracolumbar spine. IMPRESSION: 1. Moderately dilated gas and fluid-filled small bowel and colon with decompressed terminal ileum. Changes likely represent obstruction although ileus may also be present. No wall thickening or mass appreciated. 2. Airspace consolidation in both lung bases may represent pneumonia or aspiration. 3. Cardiac enlargement with small pericardial effusion. 4. Aortic atherosclerosis. 5. Calcified granulomas in the liver and spleen. 6. Internal fixation of the left hip. Heterotopic bone formation. Aortic Atherosclerosis (ICD10-I70.0). Electronically Signed   By: Lucienne Capers [name]   On: 10/21/2019 23:42   DG Chest Port 1 View  Result Date: 10/19/2019 CLINICAL DATA:  Questionable sepsis.  Evaluate for abnormality. EXAM: PORTABLE CHEST 1 VIEW COMPARISON:  Prior chest radiographs 09/09/2019 and earlier. FINDINGS: Please note the right lateral costophrenic angle is not entirely included in the field of view. Heart size within normal limits. No appreciable airspace consolidation. No evidence of pleural effusion or pneumothorax. No acute bony abnormality identified. IMPRESSION: Please note a small portion of the right lateral costophrenic angle is excluded from the field of view. No evidence of acute cardiopulmonary abnormality. Electronically Signed   By: Kellie Simmering DO   On: 10/19/2019 20:07   DG Abd 2  Views  Result Date: 10/25/2019 CLINICAL DATA:  SBO follow-up EXAM: ABDOMEN - 2 VIEW COMPARISON:  10/23/2019 FINDINGS: There is residual contrast within stomach and distal small bowel from recent swallow study. Air-filled, dilated loops large and small bowel again identified. Appearance is not substantially changed. No free air. IMPRESSION: Mildly dilated, air-filled loops colon and distal small bowel likely reflecting ileus or obstruction. Appearance is similar to the prior study. Electronically Signed   By: Macy Mis [name]   On: 10/25/2019 14:17   DG Abd 2 Views  Result Date: 10/23/2019 CLINICAL DATA:  Small-bowel obstruction. EXAM: ABDOMEN - 2 VIEW COMPARISON:  A 03/2019 FINDINGS: Dilated large and small bowel loops appear unchanged. Lumbar spondylosis.  Left hip fracture repair. Bibasilar airspace disease.  Small left effusion. IMPRESSION: Dilated large and small bowel loops unchanged. Findings most compatible with ileus versus obstruction. Electronically Signed   By: Franchot Gallo [name]   On: 10/23/2019 10:29   DG Swallowing Func-Speech Pathology  Result Date: 10/25/2019 Objective Swallowing Evaluation: Type of Study: MBS-Modified Barium Swallow Study  Patient Details Name: Davaun Quintela MRN: [id number] Date of Birth: 1940-07-05 Today's Date: 10/25/2019 Time: SLP Start Time (ACUTE ONLY): 9470 -SLP Stop Time (ACUTE ONLY): 1059 SLP Time Calculation (min) (ACUTE ONLY): 24 min Past Medical History: Past Medical History: Diagnosis Date . Diabetes mellitus without complication ([place])  . Hiatal hernia  . HTN (hypertension)   pt denies h/o HTN though it appears he was started on spironolactone during his hospitalization 3/13 . Pancreatitis  .  Seizures ([place])  . Stroke ([place])  . Typical atrial flutter ([place]) 3/13 Past Surgical History: Past Surgical History: Procedure Laterality Date . ESOPHAGOGASTRODUODENOSCOPY N/A 11/30/2015  Procedure: ESOPHAGOGASTRODUODENOSCOPY (EGD);  Surgeon: Wonda Horner, MD;  Location:  [facility] ENDOSCOPY;  Service: Endoscopy;  Laterality: N/A; . ESOPHAGOGASTRODUODENOSCOPY N/A 09/11/2019  Procedure: ESOPHAGOGASTRODUODENOSCOPY (EGD);  Surgeon: Wonda Horner, MD;  Location: [facility] ENDOSCOPY;  Service: Endoscopy;  Laterality: N/A; . ESOPHAGOGASTRODUODENOSCOPY (EGD) WITH PROPOFOL N/A 07/23/2018  Procedure: ESOPHAGOGASTRODUODENOSCOPY (EGD) WITH PROPOFOL;  Surgeon: Otis Brace, MD;  Location: [facility] ENDOSCOPY;  Service: Gastroenterology;  Laterality: N/A; . FEMUR IM NAIL Left 12/08/2016 . FEMUR IM NAIL Left 12/07/2016  Procedure: INTRAMEDULLARY (IM) NAIL FEMORAL;  Surgeon: Renette Butters, MD;  Location: [place];  Service: Orthopedics;  Laterality: Left; . GIVENS CAPSULE STUDY N/A 07/24/2018  Procedure: GIVENS CAPSULE STUDY;  Surgeon: Otis Brace, MD;  Location: [place];  Service: Gastroenterology;  Laterality: N/A; . HIP SURGERY Left 01/2017 . HOT HEMOSTASIS N/A 07/23/2018  Procedure: HOT HEMOSTASIS (ARGON PLASMA COAGULATION/BICAP);  Surgeon: Otis Brace, MD;  Location: [facility] ENDOSCOPY;  Service: Gastroenterology;  Laterality: N/A; . HOT HEMOSTASIS N/A 09/11/2019  Procedure: HOT HEMOSTASIS (ARGON PLASMA COAGULATION/BICAP);  Surgeon: Wonda Horner, MD;  Location: [facility] ENDOSCOPY;  Service: Endoscopy;  Laterality: N/A; . no surgical history  06/2011 HPI: Pt is an 79 yo male admitted secondary to generalized weakness and fever. Pt also with A fib bowel obstruction vs. ileus. PMH includes HTN, hiatal hernia, eizures, dementia, a fib, and CVA. CT abdomen also revealed airspace consolidation in both lung bases may represent pneumonia or aspiration. BSE 07/2019 reg/thin recommended.  No data recorded Assessment / Plan / Recommendation CHL IP CLINICAL IMPRESSIONS 10/25/2019 Clinical Impression Pt has baseline cervical bony protrusion that appears fused however pharyneal space is adequate and epiglottis is able to invert without obstruction. The contraction of pharynx and tongue base is intermittently reduced which in  combination with osteophytes, results in consistent vallecular and pyriform sinuses residue. One episode of barium backflow toward (not reaching) his upper nasopharynx. Laryngeal penetration present with honey, nectar and thin barium and silent aspiration with thin and nectar (< 10% trials). Increased residue and potential for aspiration with residue from honey consistency. Pt was instructed on chin tuck and earlier in study, chair placed in posterior position with pt reclined to assist with propulsion however there was no significant difference. Pt admitted with pneumonia and unfortunately remains at increased risk. If pt's medical status improves and he gains strength it is hopeful his swallow ability may return to baseline. Findings discussed with MD and decision to continue po's with Dys 2/nectar thick liquids, crush pills, upright position, small sips/bites, multiple swallow, hard coughs and full supervision.      SLP Visit Diagnosis Dysphagia, pharyngeal phase (R13.13) Attention and concentration deficit following -- Frontal lobe and executive function deficit following -- Impact on safety and function Severe aspiration risk;Moderate aspiration risk   CHL IP TREATMENT RECOMMENDATION 10/25/2019 Treatment Recommendations Therapy as outlined in treatment plan below   Prognosis 10/25/2019 Prognosis for Safe Diet Advancement Fair Barriers to Reach Goals Cognitive deficits;Other (Comment) Barriers/Prognosis Comment -- CHL IP DIET RECOMMENDATION 10/25/2019 SLP Diet Recommendations Dysphagia 2 (Fine chop) solids;Nectar thick liquid Liquid Administration via Cup Medication Administration Crushed with puree Compensations Minimize environmental distractions;Clear throat intermittently;Monitor for anterior loss;Small sips/bites;Slow rate;Other (Comment) Postural Changes Seated upright at 90 degrees;Remain semi-upright after after feeds/meals (Comment)   CHL IP OTHER RECOMMENDATIONS 10/25/2019 Recommended Consults -- Oral Care  Recommendations Oral  care BID Other Recommendations Order thickener from pharmacy   CHL IP FOLLOW UP RECOMMENDATIONS 10/25/2019 Follow up Recommendations Skilled Nursing facility   [facility] IP FREQUENCY AND DURATION 10/25/2019 Speech Therapy Frequency (ACUTE ONLY) min 2x/week Treatment Duration 2 weeks      CHL IP ORAL PHASE 10/25/2019 Oral Phase WFL Oral - Pudding Teaspoon -- Oral - Pudding Cup -- Oral - Honey Teaspoon -- Oral - Honey Cup -- Oral - Nectar Teaspoon -- Oral - Nectar Cup -- Oral - Nectar Straw -- Oral - Thin Teaspoon -- Oral - Thin Cup -- Oral - Thin Straw -- Oral - Puree -- Oral - Mech Soft -- Oral - Regular -- Oral - Multi-Consistency -- Oral - Pill -- Oral Phase - Comment --  CHL IP PHARYNGEAL PHASE 10/25/2019 Pharyngeal Phase Impaired Pharyngeal- Pudding Teaspoon -- Pharyngeal -- Pharyngeal- Pudding Cup -- Pharyngeal -- Pharyngeal- Honey Teaspoon -- Pharyngeal -- Pharyngeal- Honey Cup Penetration/Aspiration during swallow;Pharyngeal residue - valleculae;Reduced pharyngeal peristalsis;Pharyngeal residue - pyriform Pharyngeal Material enters airway, remains ABOVE vocal cords and not ejected out Pharyngeal- Nectar Teaspoon Pharyngeal residue - pyriform;Pharyngeal residue - valleculae Pharyngeal -- Pharyngeal- Nectar Cup Pharyngeal residue - valleculae;Pharyngeal residue - pyriform;Penetration/Aspiration during swallow;Reduced pharyngeal peristalsis Pharyngeal Material enters airway, passes BELOW cords without attempt by patient to eject out (silent aspiration);Material enters airway, remains ABOVE vocal cords and not ejected out Pharyngeal- Nectar Straw -- Pharyngeal -- Pharyngeal- Thin Teaspoon -- Pharyngeal -- Pharyngeal- Thin Cup Penetration/Aspiration during swallow;Pharyngeal residue - valleculae;Pharyngeal residue - pyriform Pharyngeal Material enters airway, CONTACTS cords and not ejected out;Material enters airway, passes BELOW cords without attempt by patient to eject out (silent aspiration) Pharyngeal-  Thin Straw -- Pharyngeal -- Pharyngeal- Puree Pharyngeal residue - valleculae;Pharyngeal residue - pyriform Pharyngeal -- Pharyngeal- Mechanical Soft -- Pharyngeal -- Pharyngeal- Regular -- Pharyngeal -- Pharyngeal- Multi-consistency -- Pharyngeal -- Pharyngeal- Pill -- Pharyngeal -- Pharyngeal Comment --  CHL IP CERVICAL ESOPHAGEAL PHASE 10/25/2019 Cervical Esophageal Phase WFL Pudding Teaspoon -- Pudding Cup -- Honey Teaspoon -- Honey Cup -- Nectar Teaspoon -- Nectar Cup -- Nectar Straw -- Thin Teaspoon -- Thin Cup -- Thin Straw -- Puree -- Mechanical Soft -- Regular -- Multi-consistency -- Pill -- Cervical Esophageal Comment -- [place] Siren 10/25/2019, 12:08 PM Orbie Pyo Colvin Caroli.Ed Actor Pager [phone] Office [phone]              ECHOCARDIOGRAM COMPLETE  Result Date: 10/22/2019    ECHOCARDIOGRAM REPORT   Patient Name:   JORAM VENSON Vanalstine Date of Exam: 10/22/2019 Medical Rec #:  [id number]            Height:       72.0 in Accession #:    [id number]           Weight:       121.3 lb Date of Birth:  Feb 13, 1941             BSA:          1.722 m Patient Age:    79 years             BP:           101/60 mmHg Patient Gender: M                    HR:           119 bpm. Exam Location:  Inpatient Procedure: 2D Echo Indications:    780.2 syncope  History:  Patient has prior history of Echocardiogram examinations, most                 recent 07/24/2019. Stroke, Arrythmias:Atrial Flutter; Risk                 Factors:Diabetes, Hypertension and Former Smoker.  Sonographer:    Jannett Celestine RDCS (AE) Referring Phys: [id number] ANASTASSIA DOUTOVA  Sonographer Comments: Image acquisition challenging due to respiratory motion. IMPRESSIONS  1. Left ventricular ejection fraction, by estimation, is 65 to 70%. The left ventricle has normal function. Left ventricular endocardial border not optimally defined to evaluate regional wall motion. There is mild left ventricular hypertrophy. Left  ventricular diastolic parameters are indeterminate.  2. Right ventricular systolic function is normal. The right ventricular size is normal. Tricuspid regurgitation signal is inadequate for assessing PA pressure.  3. Right atrial size was mildly dilated.  4. The mitral valve is grossly normal. Trivial mitral valve regurgitation.  5. The aortic valve was not well visualized. Aortic valve regurgitation is not visualized. No aortic stenosis is present.  6. The inferior vena cava is dilated in size with >50% respiratory variability, suggesting right atrial pressure of 8 mmHg. FINDINGS  Left Ventricle: Left ventricular ejection fraction, by estimation, is 65 to 70%. The left ventricle has normal function. Left ventricular endocardial border not optimally defined to evaluate regional wall motion. The left ventricular internal cavity size was normal in size. There is mild left ventricular hypertrophy. Left ventricular diastolic parameters are indeterminate. Right Ventricle: The right ventricular size is normal. No increase in right ventricular wall thickness. Right ventricular systolic function is normal. Tricuspid regurgitation signal is inadequate for assessing PA pressure. Left Atrium: Left atrial size was normal in size. Right Atrium: Right atrial size was mildly dilated. Pericardium: Trivial pericardial effusion is present. The pericardial effusion is surrounding the apex. Mitral Valve: The mitral valve is grossly normal. Trivial mitral valve regurgitation. Tricuspid Valve: The tricuspid valve is grossly normal. Tricuspid valve regurgitation is trivial. Aortic Valve: The aortic valve was not well visualized. Aortic valve regurgitation is not visualized. No aortic stenosis is present. Pulmonic Valve: The pulmonic valve was not well visualized. Pulmonic valve regurgitation is not visualized. Aorta: The ascending aorta was not well visualized. Venous: The inferior vena cava was not well visualized. The inferior vena cava  is dilated in size with greater than 50% respiratory variability, suggesting right atrial pressure of 8 mmHg. IAS/Shunts: The interatrial septum was not well visualized.  LEFT VENTRICLE PLAX 2D LVIDd:         2.60 cm LVIDs:         2.00 cm LV PW:         1.20 cm LV IVS:        1.20 cm LVOT diam:     2.20 cm LV SV:         50 LV SV Index:   29 LVOT Area:     3.80 cm  LEFT ATRIUM           Index       RIGHT ATRIUM           Index LA diam:      3.10 cm 1.80 cm/m  RA Area:     14.90 cm LA Vol (A2C): 33.1 ml 19.22 ml/m RA Volume:   39.20 ml  22.76 ml/m  AORTIC VALVE LVOT Vmax:   110.00 cm/s LVOT Vmean:  68.700 cm/s LVOT VTI:    0.132 m  AORTA Ao  Root diam: 3.30 cm  SHUNTS Systemic VTI:  0.13 m Systemic Diam: 2.20 cm Cherlynn Kaiser MD Electronically signed by Cherlynn Kaiser MD Signature Date/Time: 10/22/2019/6:14:52 PM    Final    IR THORACENTESIS ASP PLEURAL SPACE W/IMG GUIDE  Result Date: 10/30/2019 INDICATION: Patient with pneumonia and bilateral pleural effusions. Interventional radiology asked to perform a therapeutic thoracentesis. EXAM: ULTRASOUND GUIDED THORACENTESIS MEDICATIONS: 1% lidocaine 10 mL COMPLICATIONS: None immediate. PROCEDURE: An ultrasound guided thoracentesis was thoroughly discussed with the patient and questions answered. The benefits, risks, alternatives and complications were also discussed. The patient understands and wishes to proceed with the procedure. Written consent was obtained. Ultrasound was performed to localize and mark an adequate pocket of fluid in the right chest. The area was then prepped and draped in the normal sterile fashion. 1% Lidocaine was used for local anesthesia. Under ultrasound guidance a 6 Fr Safe-T-Centesis catheter was introduced. Thoracentesis was performed. The catheter was removed and a dressing applied. FINDINGS: A total of approximately 400 mL of clear yellow fluid was removed. IMPRESSION: Successful ultrasound guided right thoracentesis yielding 400  mL of pleural fluid. Read by: Soyla Dryer, NP Electronically Signed   By: Lucrezia Europe [name]   On: 10/30/2019 11:35        The results of significant diagnostics from this hospitalization (including imaging, microbiology, ancillary and laboratory) are listed below for reference.     Microbiology: Recent Results (from the past 240 hour(s))  Blood culture (routine x 2)     Status: None   Collection Time: 10/21/19  5:43 PM   Specimen: BLOOD  Result Value Ref Range Status   Specimen Description BLOOD SITE NOT SPECIFIED  Final   Special Requests   Final    BOTTLES DRAWN AEROBIC AND ANAEROBIC Blood Culture adequate volume   Culture   Final    NO GROWTH 5 DAYS Performed at [facility], [address], [place], [place] [address]    Report Status 10/26/2019 FINAL  Final  Blood culture (routine x 2)     Status: None   Collection Time: 10/21/19  6:11 PM   Specimen: BLOOD LEFT FOREARM  Result Value Ref Range Status   Specimen Description BLOOD LEFT FOREARM  Final   Special Requests   Final    BOTTLES DRAWN AEROBIC AND ANAEROBIC Blood Culture results may not be optimal due to an inadequate volume of blood received in culture bottles   Culture   Final    NO GROWTH 5 DAYS Performed at [facility], [place] [address] [place], [place] [address]    Report Status 10/26/2019 FINAL  Final  Urine culture     Status: Abnormal   Collection Time: 10/21/19  8:50 PM   Specimen: Urine, Clean Catch  Result Value Ref Range Status   Specimen Description URINE, CLEAN CATCH  Final   Special Requests NONE  Final   Culture (A)  Final    <10,000 COLONIES/mL INSIGNIFICANT GROWTH Performed at [facility], [place] [address], [place], [place] [address]    Report Status 10/22/2019 FINAL  Final  Respiratory Panel by PCR     Status: None   Collection Time: 10/21/19  9:27 PM   Specimen: Nasopharyngeal Swab; Respiratory  Result Value Ref Range Status   Adenovirus NOT DETECTED NOT DETECTED Final    Coronavirus 229E NOT DETECTED NOT DETECTED Final    Comment: (NOTE) The Coronavirus on the Respiratory Panel, DOES NOT test for the novel  Coronavirus (2019 nCoV)    Coronavirus HKU1 NOT DETECTED NOT DETECTED Final   Coronavirus NL63 NOT DETECTED NOT DETECTED Final   Coronavirus OC43 NOT DETECTED NOT DETECTED Final   Metapneumovirus NOT DETECTED NOT DETECTED Final   Rhinovirus / Enterovirus NOT DETECTED NOT DETECTED Final   Influenza A NOT DETECTED NOT DETECTED Final   Influenza B NOT DETECTED NOT DETECTED Final   Parainfluenza Virus 1 NOT DETECTED NOT DETECTED Final   Parainfluenza Virus 2 NOT DETECTED NOT DETECTED Final   Parainfluenza Virus 3 NOT DETECTED NOT DETECTED Final   Parainfluenza Virus 4 NOT DETECTED NOT DETECTED Final   Respiratory Syncytial Virus NOT DETECTED NOT DETECTED Final   Bordetella pertussis NOT DETECTED NOT DETECTED Final   Chlamydophila pneumoniae NOT DETECTED NOT DETECTED Final   Mycoplasma pneumoniae NOT DETECTED NOT DETECTED Final    Comment: Performed at [facility], [place] [address], [place], [place] [address]  Culture, sputum-assessment     Status: None   Collection Time: 10/21/19 11:44 PM   Specimen: Sputum  Result Value Ref Range Status   Specimen Description SPUTUM  Final   Special Requests COLLECTED IN THE ER  Final   Sputum evaluation   Final    THIS SPECIMEN IS ACCEPTABLE FOR SPUTUM CULTURE Performed at [facility], [address] [place], [place] [address]    Report Status 10/22/2019 FINAL  Final  Culture, respiratory     Status: None   Collection Time: 10/21/19 11:44 PM   Specimen: SPU  Result Value Ref Range Status   Specimen Description SPUTUM  Final   Special Requests COLLECTED IN THE ER Reflexed from [id number]  Final   Gram Stain   Final    RARE WBC PRESENT,BOTH PMN AND MONONUCLEAR FEW GRAM POSITIVE COCCI IN PAIRS FEW GRAM VARIABLE ROD RARE BUDDING YEAST SEEN    Culture   Final    RARE Consistent with normal  respiratory flora. Performed at [facility], [place] [address], [place], [place] [address]    Report Status 10/24/2019 FINAL  Final  MRSA PCR Screening     Status: None   Collection Time: 10/22/19 12:43 AM   Specimen: Nasal Mucosa; Nasopharyngeal  Result Value Ref Range Status   MRSA by PCR NEGATIVE NEGATIVE Final    Comment:        The GeneXpert MRSA Assay (FDA approved for NASAL specimens only), is one component of a comprehensive MRSA colonization surveillance program. It is not intended to diagnose MRSA infection nor to guide or monitor treatment for MRSA infections. Performed at [facility], [place] [address], [place], [place] [address]   SARS CORONAVIRUS 2 (TAT 6-24 HRS) Nasopharyngeal Nasopharyngeal Swab     Status: None   Collection Time: 10/30/19  4:23 PM   Specimen: Nasopharyngeal Swab  Result Value Ref Range Status   SARS Coronavirus 2 NEGATIVE NEGATIVE Final    Comment: (NOTE) SARS-CoV-2 target nucleic acids are NOT DETECTED.  The SARS-CoV-2 RNA is generally detectable in upper and lower respiratory specimens during the acute phase of infection. Negative results do not preclude SARS-CoV-2 infection, do not rule out co-infections with other pathogens, and should not be used as the sole basis for treatment or other patient management decisions. Negative results must be combined with clinical observations, patient history, and epidemiological information. The expected result is Negative.  Fact Sheet for Patients: SugarRoll.be  Fact Sheet for Healthcare Providers: https://www.woods-mathews.com/  This test is not yet approved  or cleared by the [place] and  has been authorized for detection and/or diagnosis of SARS-CoV-2 by FDA under an Emergency Use Authorization (EUA). This EUA will remain  in effect (meaning this test can be used) for the duration of the COVID-19 declaration under Se ction 564(b)(1) of  the Act, 21 U.S.C. section 360bbb-3(b)(1), unless the authorization is terminated or revoked sooner.  Performed at [facility], [place] [address] [place], [place] [address]      Labs: BNP (last 3 results) Recent Labs    10/21/19 1742  BNP 381.0*   Basic Metabolic Panel: Recent Labs  Lab 10/25/19 0107 10/26/19 0315 10/29/19 0224 10/30/19 0158 10/31/19 0352  NA 139 140 140 139 137  K 3.2* 3.3* 3.3* 3.5 3.5  CL 104 104 102 100 100  CO2 27 27 30 30 29   GLUCOSE 119* 82 111* 127* 109*  BUN 5* 6* 6* 7* 7*  CREATININE 0.65 0.59* 0.61 0.63 0.60*  CALCIUM 7.5* 7.9* 8.2* 8.5* 8.1*  MG  --   --  1.6*  --   --    Liver Function Tests: Recent Labs  Lab 10/25/19 0107 10/26/19 0315 10/30/19 0158  AST 24 23 22   ALT 21 19 17   ALKPHOS 52 56 66  BILITOT 0.9 0.8 0.7  PROT 5.6* 5.6* 5.8*  ALBUMIN 2.5* 2.6* 2.7*   No results for input(s): LIPASE, AMYLASE in the last 168 hours. No results for input(s): AMMONIA in the last 168 hours. CBC: Recent Labs  Lab 10/27/19 0132 10/28/19 0144 10/29/19 0224 10/30/19 0158 10/31/19 0352  WBC 8.1 8.6 7.7 7.4 6.9  NEUTROABS  --   --   --   --  4.0  HGB 7.6* 7.5* 7.8* 7.8* 7.5*  HCT 23.5* 23.9* 24.7* 25.3* 23.4*  MCV 87.4 89.2 88.2 87.5 88.0  PLT 350 325 373 364 331   Cardiac Enzymes: No results for input(s): CKTOTAL, CKMB, CKMBINDEX, TROPONINI in the last 168 hours. BNP: Invalid input(s): POCBNP CBG: Recent Labs  Lab 10/29/19 1656 10/29/19 2033 10/29/19 2352 10/30/19 0347 10/30/19 2146  GLUCAP 111* 159* 149* 111* 146*   D-Dimer No results for input(s): DDIMER in the last 72 hours. Hgb A1c No results for input(s): HGBA1C in the last 72 hours. Lipid Profile No results for input(s): CHOL, HDL, LDLCALC, TRIG, CHOLHDL, LDLDIRECT in the last 72 hours. Thyroid function studies No results for input(s): TSH, T4TOTAL, T3FREE, THYROIDAB in the last 72 hours.  Invalid input(s): FREET3 Anemia work up No results for input(s):  VITAMINB12, FOLATE, FERRITIN, TIBC, IRON, RETICCTPCT in the last 72 hours. Urinalysis    Component Value Date/Time   COLORURINE YELLOW 10/21/2019 2050   APPEARANCEUR HAZY (A) 10/21/2019 2050   LABSPEC 1.028 10/21/2019 2050   PHURINE 5.0 10/21/2019 2050   GLUCOSEU >=500 (A) 10/21/2019 2050   HGBUR SMALL (A) 10/21/2019 2050   BILIRUBINUR NEGATIVE 10/21/2019 2050   KETONESUR NEGATIVE 10/21/2019 2050   PROTEINUR 100 (A) 10/21/2019 2050   UROBILINOGEN 0.2 06/02/2011 0300   NITRITE NEGATIVE 10/21/2019 2050   LEUKOCYTESUR NEGATIVE 10/21/2019 2050   Sepsis Labs Invalid input(s): PROCALCITONIN,  WBC,  LACTICIDVEN   Time coordinating discharge: 35 minutes  SIGNED:  Mercy Riding, MD  [facility] 10/31/2019, 11:21 AM  If 7PM-7AM, please contact night-coverage [email or web address] Password [email or web address]

## 2019-10-31 NOTE — Progress Notes (Signed)
ANTICOAGULATION CONSULT NOTE  Pharmacy Consult:  Warfarin Indication: atrial fibrillation  No Known Allergies  Patient Measurements: Height: 6' (182.9 cm) Weight: 55 kg (121 lb 4.1 oz) IBW/kg (Calculated) : 77.6  Vital Signs: Temp: 98 F (36.7 C) (08/11 0810) BP: 125/63 (08/11 0810) Pulse Rate: 85 (08/11 0810)  Labs: Recent Labs    10/29/19 0224 10/29/19 0224 10/30/19 0158 10/31/19 0352  HGB 7.8*   < > 7.8* 7.5*  HCT 24.7*  --  25.3* 23.4*  PLT 373  --  364 331  LABPROT 28.2*  --  22.7* 22.5*  INR 2.8*  --  2.1* 2.1*  CREATININE 0.61  --  0.63 0.60*   < > = values in this interval not displayed.    Estimated Creatinine Clearance: 57.3 mL/min (A) (by C-G formula based on SCr of 0.6 mg/dL (L)).  Assessment: 79 year old male on warfarin PTA for Afib. Warfarin was initially held for partial SBO. Pharmacy consulted to restart warfarin 8/4 after patient passed swallow evaluation.  Outpatient INRs in July were either sub or supratherapeutic, likely due to acute illnesses including GIB in June and labile PO intake.   Warfarin dose PTA: 2.5mg  on Mon/Fri, 5mg  all other days  INR today 2.1 -  Within goal INR 2-3.     Goal of Therapy:  INR 2-3 Monitor platelets by anticoagulation protocol: Yes   Plan:  Warfarin 5 mg po x 1 tonight Monitor daily INR, CBC/plt Monitor for signs/symptoms of bleeding    Barth Kirks, PharmD, BCPS, BCCCP Clinical Pharmacist [phone]  Please check AMION for all [place] numbers  10/31/2019 10:10 AM

## 2019-10-31 NOTE — Telephone Encounter (Signed)
lmom for overdue inr 

## 2019-10-31 NOTE — TOC Transition Note (Addendum)
Transition of Care [facility]) - CM/SW Discharge Note   Patient Details  Name: [name] MRN: [id number] Date of Birth: October 02, 1940  Transition of Care [facility]) CM/SW Contact:  Angelita Ingles, RN Phone Number: [phone]  10/31/2019, 1:41 PM   Clinical Narrative:   [facility] has extended bed offer to the patient. D/c summary and proof of vaccination have been faxed. Daughter [name] has been updated. Discharge packet at nurses station. [facility] has been called for transport. Bedside nurse made aware. CM will sign off.    Please call report to [facility]-  [phone] Room # 1007     Barriers to Discharge: No Barriers Identified   Patient Goals and CMS Choice Patient states their goals for this hospitalization and ongoing recovery are:: Wants to go live with his daughter CMS Medicare.gov Compare Post Acute Care list provided to:: Other (Comment Required) (Daughter [name]) Choice offered to / list presented to : Adult Children  Discharge Placement                Patient to be transferred to facility by: [place] Name of family member notified: Lyan Moyano Patient and family notified of of transfer: 10/31/19  Discharge Plan and Services In-house Referral: NA Discharge Planning Services: CM Consult Post Acute Care Choice: NA          DME Arranged: N/A DME Agency: NA       HH Arranged: NA HH Agency: NA        Social Determinants of Health (SDOH) Interventions     Readmission Risk Interventions Readmission Risk Prevention Plan 09/12/2019 07/25/2018  Transportation Screening Complete Complete  PCP or Specialist Appt within 5-7 Days - Complete  PCP or Specialist Appt within 3-5 Days Complete -  Home Care Screening - Complete  Medication Review (RN CM) - Complete  HRI or Home Care Consult Complete -  Social Work Consult for [place] Planning/Counseling Complete -  Palliative Care Screening Not Applicable -  Medication Review Press photographer) Complete -  Some  recent data might be hidden

## 2019-10-31 NOTE — Consult Note (Signed)
   [name] CM Inpatient Consult   10/31/2019  [name] [name] 10-09-1940 [id number]   Follow up:  Active [facility] ACO Patient  Patient is transitioning to [facility], will follow up with [facility] RN PAC regarding transition of care.  For questions, please contact:  Natividad Brood, RN BSN [facility] Liaison  [phone] business mobile phone Toll free office [phone]  Fax number: [phone] [email or web address]

## 2019-10-31 NOTE — Care Management Important Message (Signed)
Important Message  Patient Details  Name: [name] MRN: [id number] Date of Birth: 03-Jan-1941   Medicare Important Message Given:  Yes     Orbie Pyo 10/31/2019, 3:32 PM

## 2019-10-31 NOTE — TOC Progression Note (Addendum)
Transition of Care [facility]) - Progression Note    Patient Details  Name: [name] MRN: [id number] Date of Birth: 09-14-1940  Transition of Care [facility]) CM/SW [place], RN Phone Number: [phone]  10/31/2019, 9:59 AM  Clinical Narrative:    CM has called and left voicemail and sent message to [facility] with admissions at [name]. Will await to hear if facility has a bed available for patient.  1020 Bed is available at [facility]. MD made aware. Will await d/c summary and orders. [name] has been update.    Expected Discharge Plan: Skilled Nursing Facility Barriers to Discharge: Continued Medical Work up  Expected Discharge Plan and Services Expected Discharge Plan: [place] In-house Referral: NA Discharge Planning Services: CM Consult Post Acute Care Choice: NA Living arrangements for the past 2 months: Single Family Home                 DME Arranged: N/A DME Agency: NA       HH Arranged: NA HH Agency: NA         Social Determinants of Health (SDOH) Interventions    Readmission Risk Interventions Readmission Risk Prevention Plan 09/12/2019 07/25/2018  Transportation Screening Complete Complete  PCP or Specialist Appt within 5-7 Days - Complete  PCP or Specialist Appt within 3-5 Days Complete -  Home Care Screening - Complete  Medication Review (RN CM) - Complete  HRI or Home Care Consult Complete -  Social Work Consult for [place] Planning/Counseling Complete -  Palliative Care Screening Not Applicable -  Medication Review Press photographer) Complete -  Some recent data might be hidden

## 2019-11-01 DIAGNOSIS — A403 Sepsis due to Streptococcus pneumoniae: Secondary | ICD-10-CM | POA: Diagnosis not present

## 2019-11-01 DIAGNOSIS — J9601 Acute respiratory failure with hypoxia: Secondary | ICD-10-CM | POA: Diagnosis not present

## 2019-11-01 DIAGNOSIS — I69391 Dysphagia following cerebral infarction: Secondary | ICD-10-CM | POA: Diagnosis not present

## 2019-11-01 DIAGNOSIS — F0150 Vascular dementia without behavioral disturbance: Secondary | ICD-10-CM | POA: Diagnosis not present

## 2019-11-02 ENCOUNTER — Other Ambulatory Visit: Payer: Self-pay | Admitting: *Deleted

## 2019-11-02 NOTE — Patient Outreach (Signed)
Member screened for potential [facility] Care Management needs as a benefit of [place] Medicare.  Made aware by [facility] Liaison that member transferred to [facility]. Mr. Larkey active with [facility] RNCM prior. Noted member's daughter [name] is primary contact. Also noted member's daughter wanted member to transfer to facility in [place].  Will plan to collaborate with [facility] SW to make aware writer is following for transition plans and that member has been active with [place] Management.  Will update [facility] Care Management RNCM as appropriate.   Marthenia Rolling, MSN-Ed, RN,BSN [place] Acute Care Coordinator [phone] [name]) [phone]  (Toll free office)

## 2019-11-05 DIAGNOSIS — R4182 Altered mental status, unspecified: Secondary | ICD-10-CM | POA: Diagnosis not present

## 2019-11-05 DIAGNOSIS — F0150 Vascular dementia without behavioral disturbance: Secondary | ICD-10-CM | POA: Diagnosis not present

## 2019-11-05 DIAGNOSIS — R5381 Other malaise: Secondary | ICD-10-CM | POA: Diagnosis not present

## 2019-11-05 DIAGNOSIS — I69391 Dysphagia following cerebral infarction: Secondary | ICD-10-CM | POA: Diagnosis not present

## 2019-11-08 ENCOUNTER — Other Ambulatory Visit: Payer: Self-pay | Admitting: *Deleted

## 2019-11-08 NOTE — Patient Outreach (Signed)
[facility] Post- Acute Care Coordinator follow up. Member screened for potential [facility] Care Management needs as a benefit of [place] Medicare.  [name] is receiving skilled therapy at [facility].  Communication sent to facility SW (earlier in the week) to inquire about anticipated dc plans and needs.  Will plan to outreach to member's daughter/DPR [name] to discuss transition plans and potential [facility] needs.  Brent Rolling, MSN-Ed, [name],[name] [place] Acute Care Coordinator [phone] [name]) [phone]  (Toll free office)

## 2019-11-12 ENCOUNTER — Ambulatory Visit (INDEPENDENT_AMBULATORY_CARE_PROVIDER_SITE_OTHER): Payer: Medicare Other | Admitting: *Deleted

## 2019-11-12 ENCOUNTER — Other Ambulatory Visit: Payer: Self-pay | Admitting: *Deleted

## 2019-11-12 ENCOUNTER — Ambulatory Visit
Admission: RE | Admit: 2019-11-12 | Discharge: 2019-11-12 | Disposition: A | Discharge status: Home / Self Care | Payer: Medicare Other | Source: Ambulatory Visit | Attending: Geriatric Medicine | Admitting: Geriatric Medicine

## 2019-11-12 ENCOUNTER — Other Ambulatory Visit: Payer: Self-pay | Admitting: Geriatric Medicine

## 2019-11-12 ENCOUNTER — Other Ambulatory Visit: Payer: Self-pay

## 2019-11-12 DIAGNOSIS — D500 Iron deficiency anemia secondary to blood loss (chronic): Secondary | ICD-10-CM | POA: Diagnosis not present

## 2019-11-12 DIAGNOSIS — I4892 Unspecified atrial flutter: Secondary | ICD-10-CM

## 2019-11-12 DIAGNOSIS — Z8701 Personal history of pneumonia (recurrent): Secondary | ICD-10-CM | POA: Diagnosis not present

## 2019-11-12 DIAGNOSIS — J90 Pleural effusion, not elsewhere classified: Secondary | ICD-10-CM | POA: Diagnosis not present

## 2019-11-12 DIAGNOSIS — J158 Pneumonia due to other specified bacteria: Secondary | ICD-10-CM

## 2019-11-12 DIAGNOSIS — Z79899 Other long term (current) drug therapy: Secondary | ICD-10-CM | POA: Diagnosis not present

## 2019-11-12 DIAGNOSIS — Z5181 Encounter for therapeutic drug level monitoring: Secondary | ICD-10-CM | POA: Diagnosis not present

## 2019-11-12 DIAGNOSIS — I517 Cardiomegaly: Secondary | ICD-10-CM | POA: Diagnosis not present

## 2019-11-12 LAB — POCT INR: INR: 2.8 (ref 2.0–3.0)

## 2019-11-12 NOTE — Patient Outreach (Signed)
[facility] Post- Acute Care Coordinator follow up. Member screened for potential [facility] Care Management needs as a benefit of [place] Medicare.  Per Patient [name] [name] was discharged from [facility] on 11/10/19.   Telephone call made to [name] (daughter/DPR) [phone]. Patient identifiers confirmed.   [name] endorses that she picked [name] up on Saturday 11/10/19 from SNF. States he is doing a lot better now. [name] reports that member will now live with her in [place], [place] permanently. States they are on the way to PCP appointment now and will find out from PCP who he suggests for PCP follow up in [place].   No further [facility] Care Management needs at this time. Will update [facility] Care Management team.   [name], [place], RN,BSN [place] Acute Care Coordinator [phone] [facility]) [phone]  (Toll free office)

## 2019-11-12 NOTE — Patient Instructions (Signed)
Description   Continue taking Warfarin 2 tablets daily except for 1 tablet on Mondays and Fridays. Recheck INR in 2 weeks.  [facility]. [phone].

## 2019-11-12 NOTE — Patient Outreach (Signed)
[facility]) Care Management  11/12/2019  [name] [facility] 25-Sep-1940 [id number]   Notified by post acute care coordinator that member was discharged from SNF but is now relocating to [place] to live with daughter.  Call placed to daughter to close out case, no answer, unable to leave a message.  Will close case and notify PCP of closure.  [name], [place], MSN [place] [phone]

## 2019-11-13 DIAGNOSIS — J189 Pneumonia, unspecified organism: Secondary | ICD-10-CM | POA: Diagnosis not present

## 2019-11-13 DIAGNOSIS — J181 Lobar pneumonia, unspecified organism: Secondary | ICD-10-CM | POA: Diagnosis not present

## 2019-11-13 DIAGNOSIS — Z87891 Personal history of nicotine dependence: Secondary | ICD-10-CM | POA: Diagnosis not present

## 2019-11-13 DIAGNOSIS — J029 Acute pharyngitis, unspecified: Secondary | ICD-10-CM | POA: Diagnosis not present

## 2019-11-13 DIAGNOSIS — R509 Fever, unspecified: Secondary | ICD-10-CM | POA: Diagnosis not present

## 2019-11-14 DIAGNOSIS — R0982 Postnasal drip: Secondary | ICD-10-CM | POA: Diagnosis not present

## 2019-11-14 DIAGNOSIS — R490 Dysphonia: Secondary | ICD-10-CM | POA: Diagnosis not present

## 2019-11-14 DIAGNOSIS — T17908A Unspecified foreign body in respiratory tract, part unspecified causing other injury, initial encounter: Secondary | ICD-10-CM | POA: Diagnosis not present

## 2019-11-14 DIAGNOSIS — R1310 Dysphagia, unspecified: Secondary | ICD-10-CM | POA: Diagnosis not present

## 2019-11-15 ENCOUNTER — Other Ambulatory Visit (HOSPITAL_COMMUNITY): Payer: Self-pay | Admitting: Physician Assistant

## 2019-11-21 DIAGNOSIS — 419620001 Death: Secondary | SNOMED CT | POA: Diagnosis not present

## 2019-11-21 DEATH — deceased

## 2019-11-22 ENCOUNTER — Ambulatory Visit (HOSPITAL_COMMUNITY): Payer: Medicare Other | Admitting: Physician Assistant

## 2021-04-03 IMAGING — DX DG ABDOMEN 2V
2 series · 2 of 2 positions shown · non-contrast
Comparison: 10/23/2019

CLINICAL DATA: SBO follow-up

EXAM:
ABDOMEN - 2 VIEW

[abdomen erect]
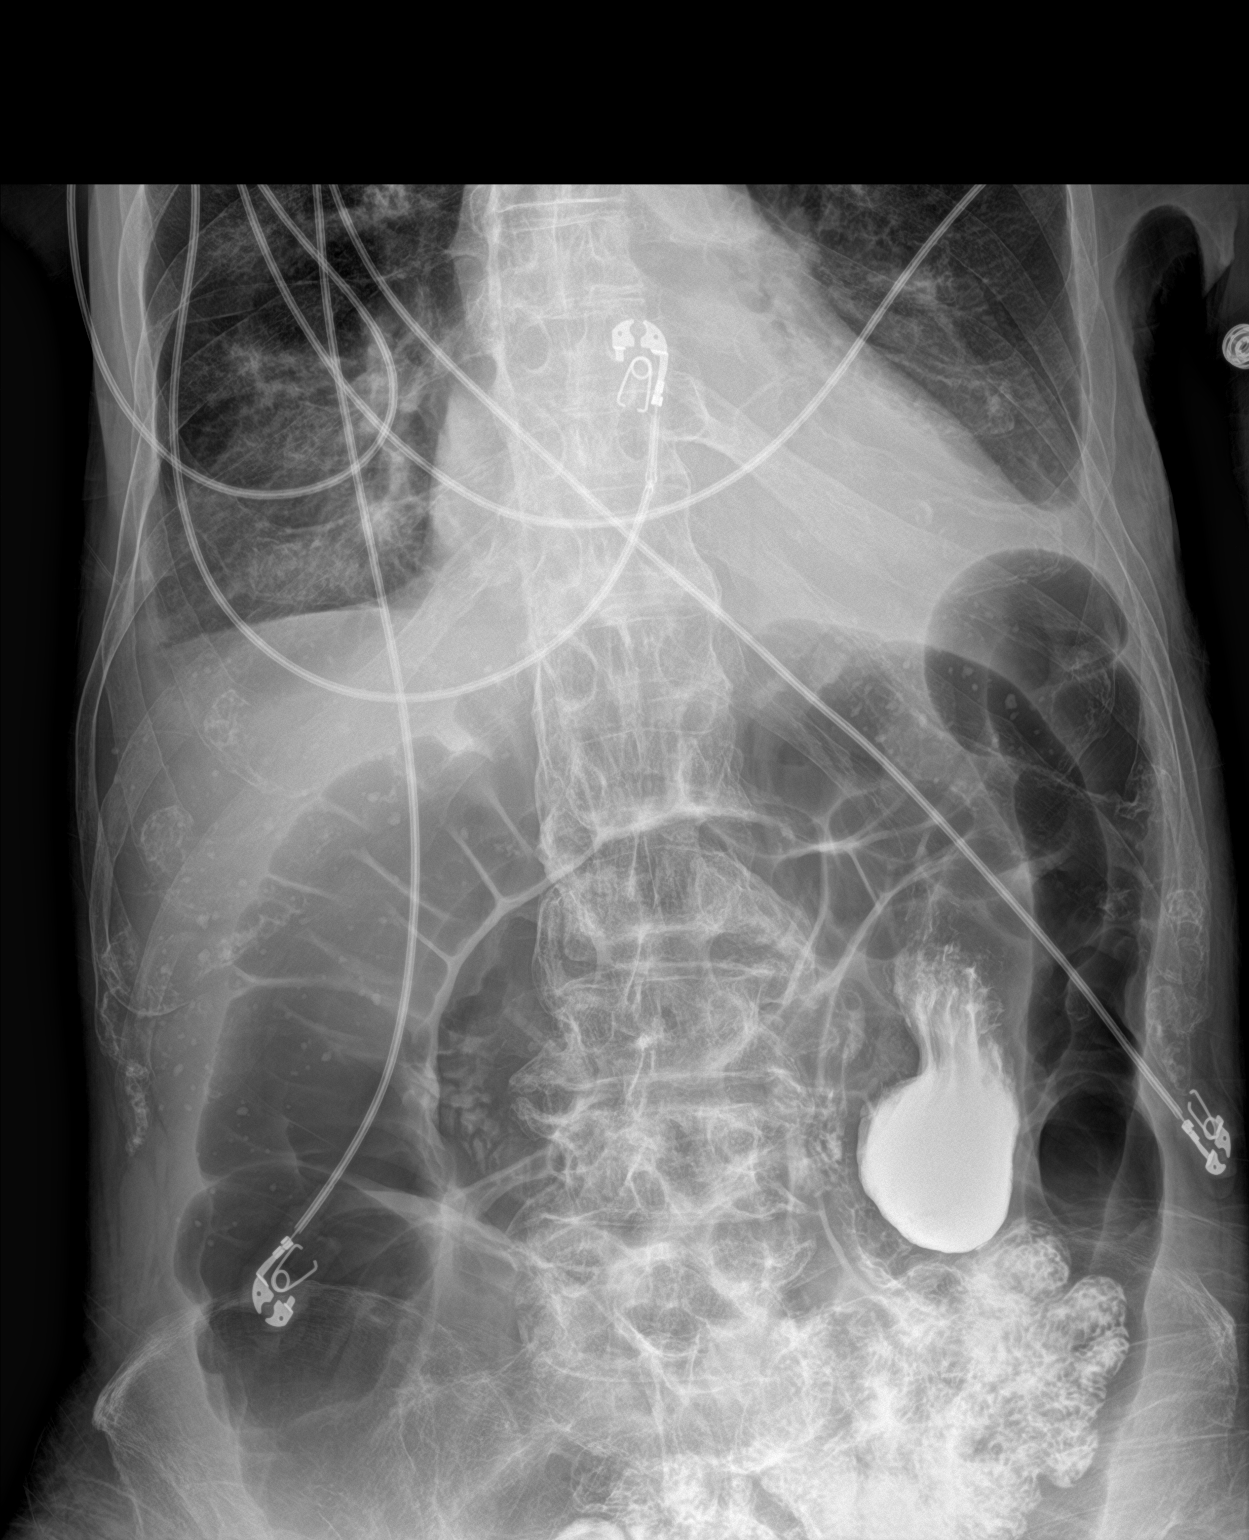

[abdomen supine]
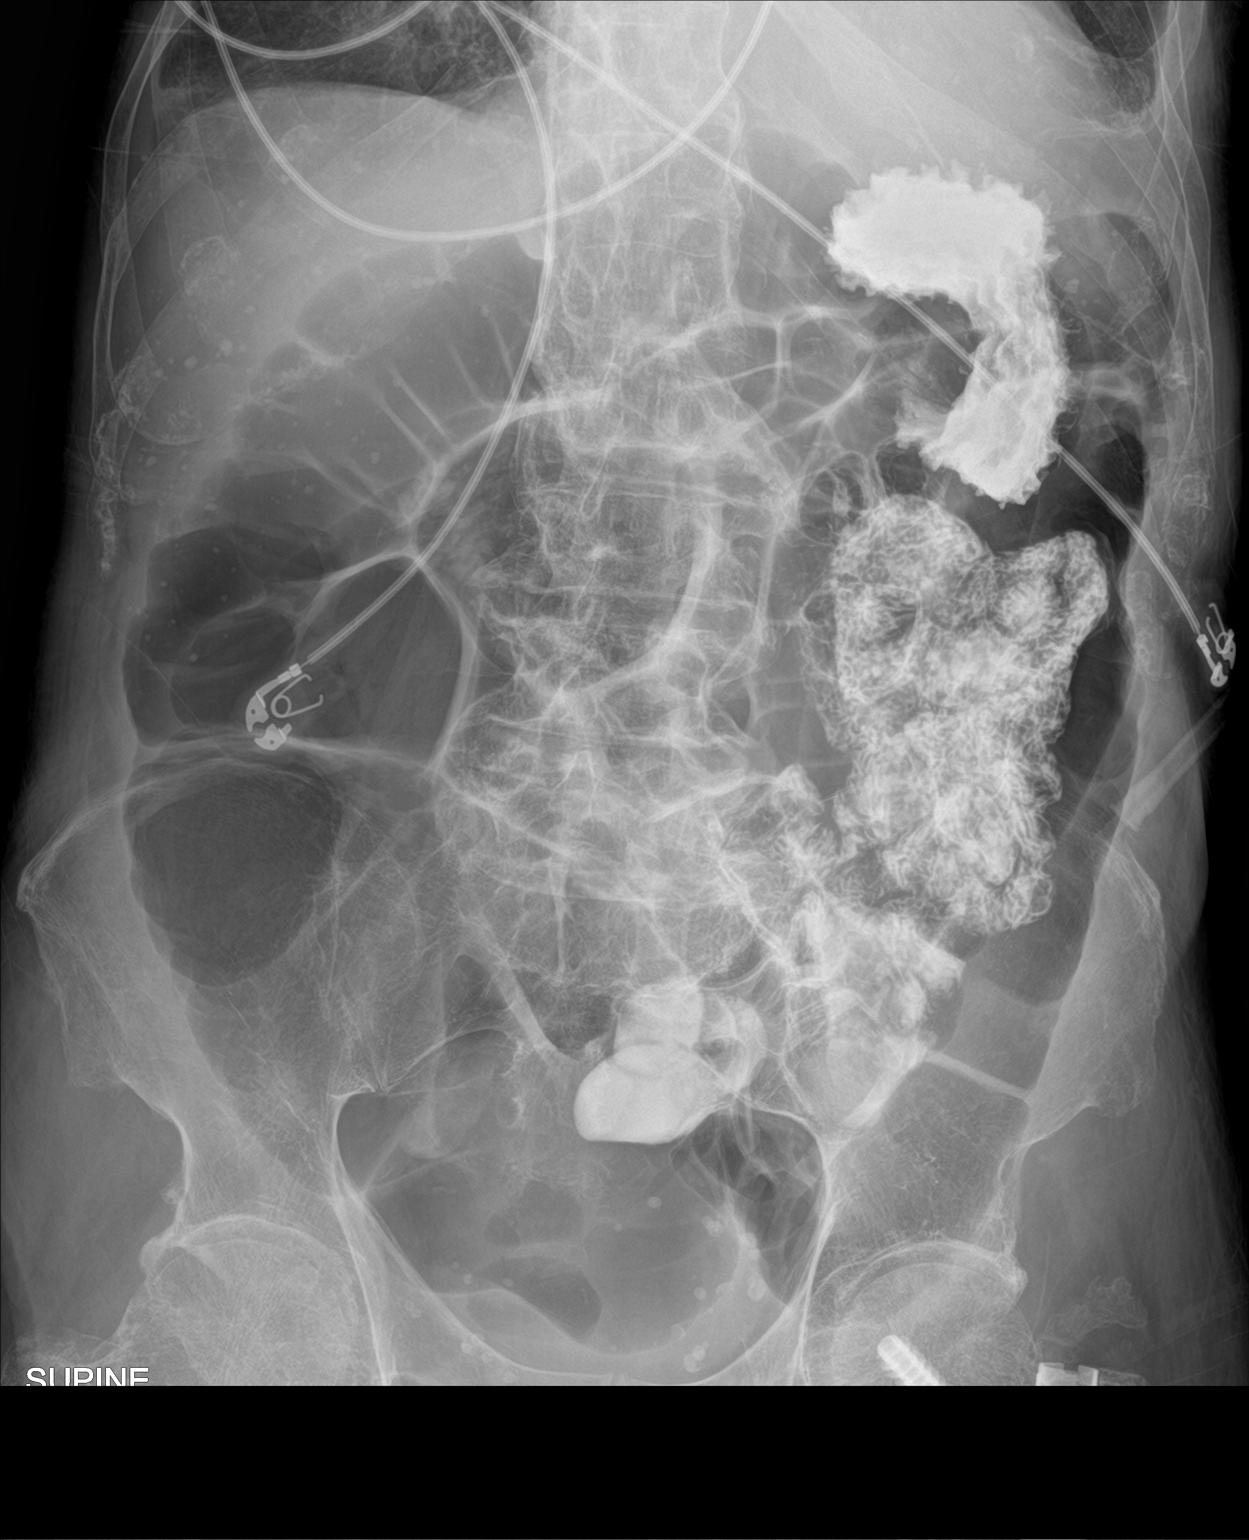

[2 of 2 positions shown; findings below may reference images not displayed]

FINDINGS: There is residual contrast within stomach and distal small bowel
from recent swallow study. Air-filled, dilated loops large and small
bowel again identified. Appearance is not substantially changed. No
free air.
IMPRESSION: Mildly dilated, air-filled loops colon and distal small bowel likely
reflecting ileus or obstruction. Appearance is similar to the prior
study.

## 2021-04-08 IMAGING — US IR THORACENTESIS ASP PLEURAL SPACE W/IMG GUIDE
1 series · 2 of 2 positions shown · non-contrast
Comparison: none

INDICATION: Patient with pneumonia and bilateral pleural effusions.
Interventional radiology asked to perform a therapeutic
thoracentesis.

[Series 1: ir (id) (id)/(id)/(id) ir · 2 of 2 slices shown]
[im 1/2]
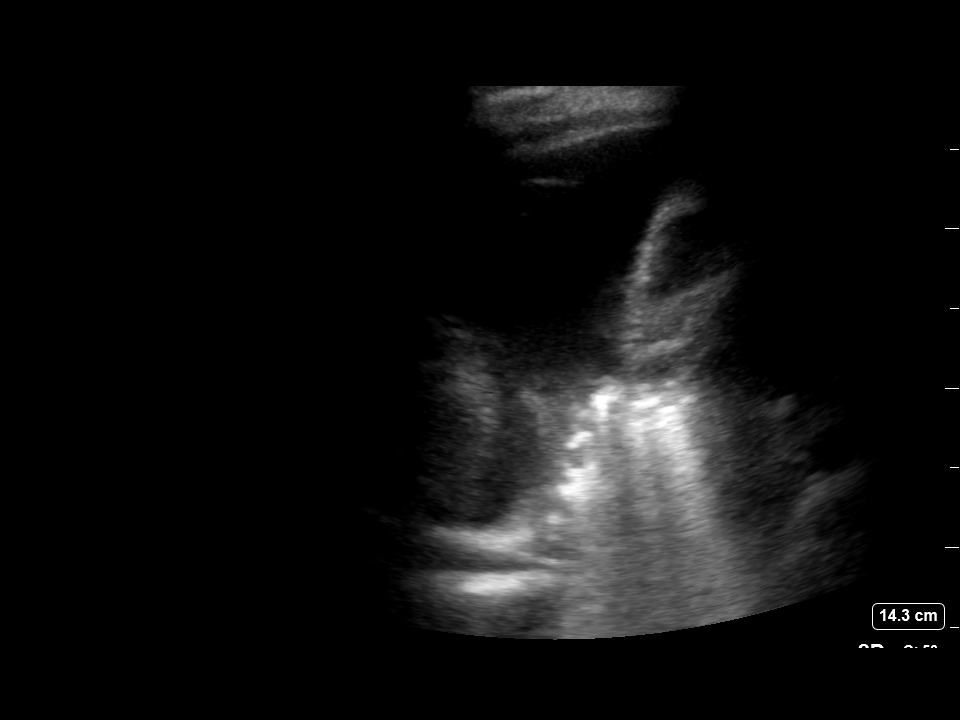
[im 2/2]
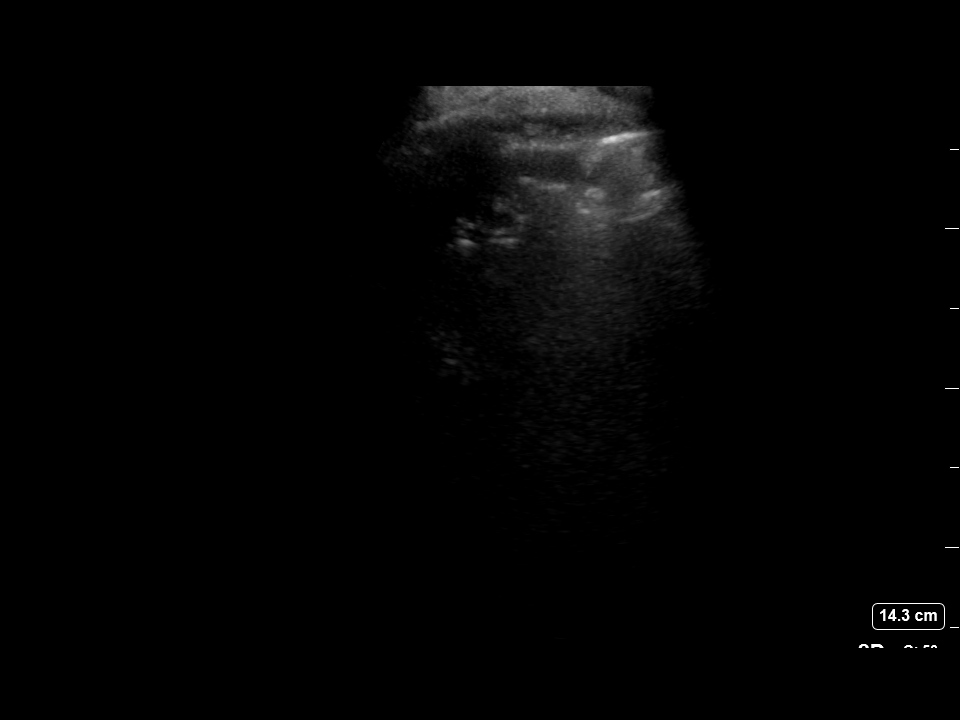

[2 of 2 positions shown; findings below may reference images not displayed]

EXAM:
ULTRASOUND GUIDED THORACENTESIS

MEDICATIONS:
1% lidocaine 10 mL

COMPLICATIONS:
None immediate.

PROCEDURE:
An ultrasound guided thoracentesis was thoroughly discussed with the
patient and questions answered. The benefits, risks, alternatives
and complications were also discussed. The patient understands and
wishes to proceed with the procedure. Written consent was obtained.

Ultrasound was performed to localize and mark an adequate pocket of
fluid in the right chest. The area was then prepped and draped in
the normal sterile fashion. 1% Lidocaine was used for local
anesthesia. Under ultrasound guidance a 6 Fr Safe-T-Centesis
catheter was introduced. Thoracentesis was performed. The catheter
was removed and a dressing applied.
FINDINGS: A total of approximately 400 mL of clear yellow fluid was removed.
IMPRESSION: Successful ultrasound guided right thoracentesis yielding 400 mL of
pleural fluid. Read by: Zeinab Tiger, NP
